# Patient Record
Sex: Female | Born: 1945
Health system: Southern US, Community
[De-identification: ages and names within clinical notes are randomized; demographics above are authoritative.]

## PROBLEM LIST (undated history)

## (undated) DIAGNOSIS — I10 Essential (primary) hypertension: Secondary | ICD-10-CM

## (undated) DIAGNOSIS — E119 Type 2 diabetes mellitus without complications: Secondary | ICD-10-CM

## (undated) DIAGNOSIS — I2510 Atherosclerotic heart disease of native coronary artery without angina pectoris: Secondary | ICD-10-CM

## (undated) DIAGNOSIS — E785 Hyperlipidemia, unspecified: Secondary | ICD-10-CM

## (undated) DIAGNOSIS — E039 Hypothyroidism, unspecified: Secondary | ICD-10-CM

## (undated) DIAGNOSIS — N183 Chronic kidney disease, stage 3 unspecified: Secondary | ICD-10-CM

## (undated) DIAGNOSIS — C649 Malignant neoplasm of unspecified kidney, except renal pelvis: Secondary | ICD-10-CM

## (undated) DIAGNOSIS — Z8639 Personal history of other endocrine, nutritional and metabolic disease: Secondary | ICD-10-CM

## (undated) DIAGNOSIS — S37019A Minor contusion of unspecified kidney, initial encounter: Secondary | ICD-10-CM

## (undated) DIAGNOSIS — N184 Chronic kidney disease, stage 4 (severe): Secondary | ICD-10-CM

## (undated) DIAGNOSIS — I214 Non-ST elevation (NSTEMI) myocardial infarction: Secondary | ICD-10-CM

## (undated) DIAGNOSIS — J189 Pneumonia, unspecified organism: Secondary | ICD-10-CM

## (undated) DIAGNOSIS — M1990 Unspecified osteoarthritis, unspecified site: Secondary | ICD-10-CM

## (undated) DIAGNOSIS — N2889 Other specified disorders of kidney and ureter: Secondary | ICD-10-CM

## (undated) DIAGNOSIS — R809 Proteinuria, unspecified: Secondary | ICD-10-CM

## (undated) HISTORY — DX: Malignant neoplasm of unspecified kidney, except renal pelvis: C64.9

## (undated) HISTORY — DX: Type 2 diabetes mellitus without complications: E11.9

## (undated) HISTORY — PX: ABDOMINAL HYSTERECTOMY: SHX81

## (undated) HISTORY — DX: Chronic kidney disease, stage 4 (severe): N18.4

## (undated) HISTORY — DX: Essential (primary) hypertension: I10

## (undated) HISTORY — DX: Hyperlipidemia, unspecified: E78.5

## (undated) HISTORY — DX: Chronic kidney disease, stage 3 (moderate): N18.3

## (undated) HISTORY — DX: Atherosclerotic heart disease of native coronary artery without angina pectoris: I25.10

## (undated) HISTORY — DX: Personal history of other endocrine, nutritional and metabolic disease: Z86.39

## (undated) HISTORY — DX: Proteinuria, unspecified: R80.9

## (undated) HISTORY — DX: Other specified disorders of kidney and ureter: N28.89

---
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
## 2002-02-21 ENCOUNTER — Emergency Department (HOSPITAL_COMMUNITY): Admission: EM | Admit: 2002-02-21 | Discharge: 2002-02-21 | Payer: Self-pay | Admitting: Emergency Medicine

## 2002-02-21 ENCOUNTER — Encounter: Payer: Self-pay | Admitting: Emergency Medicine

## 2003-10-20 IMAGING — CR DG ABDOMEN ACUTE W/ 1V CHEST
3 series · 3 of 3 positions shown · non-contrast
Comparison: none

CLINICAL DATA: Nausea and vomiting.  Weakness.  Obese patient. 
 ACUTE ABDOMINAL SERIES WITH CHEST [DATE]:
 The bowel gas pattern is normal.  There is no free peritoneal air.  The upright chest film included with the study demonstrates no active chest disease radiographically.  There is borderline cardiomegaly present.

[view not recorded (1 of 3)]
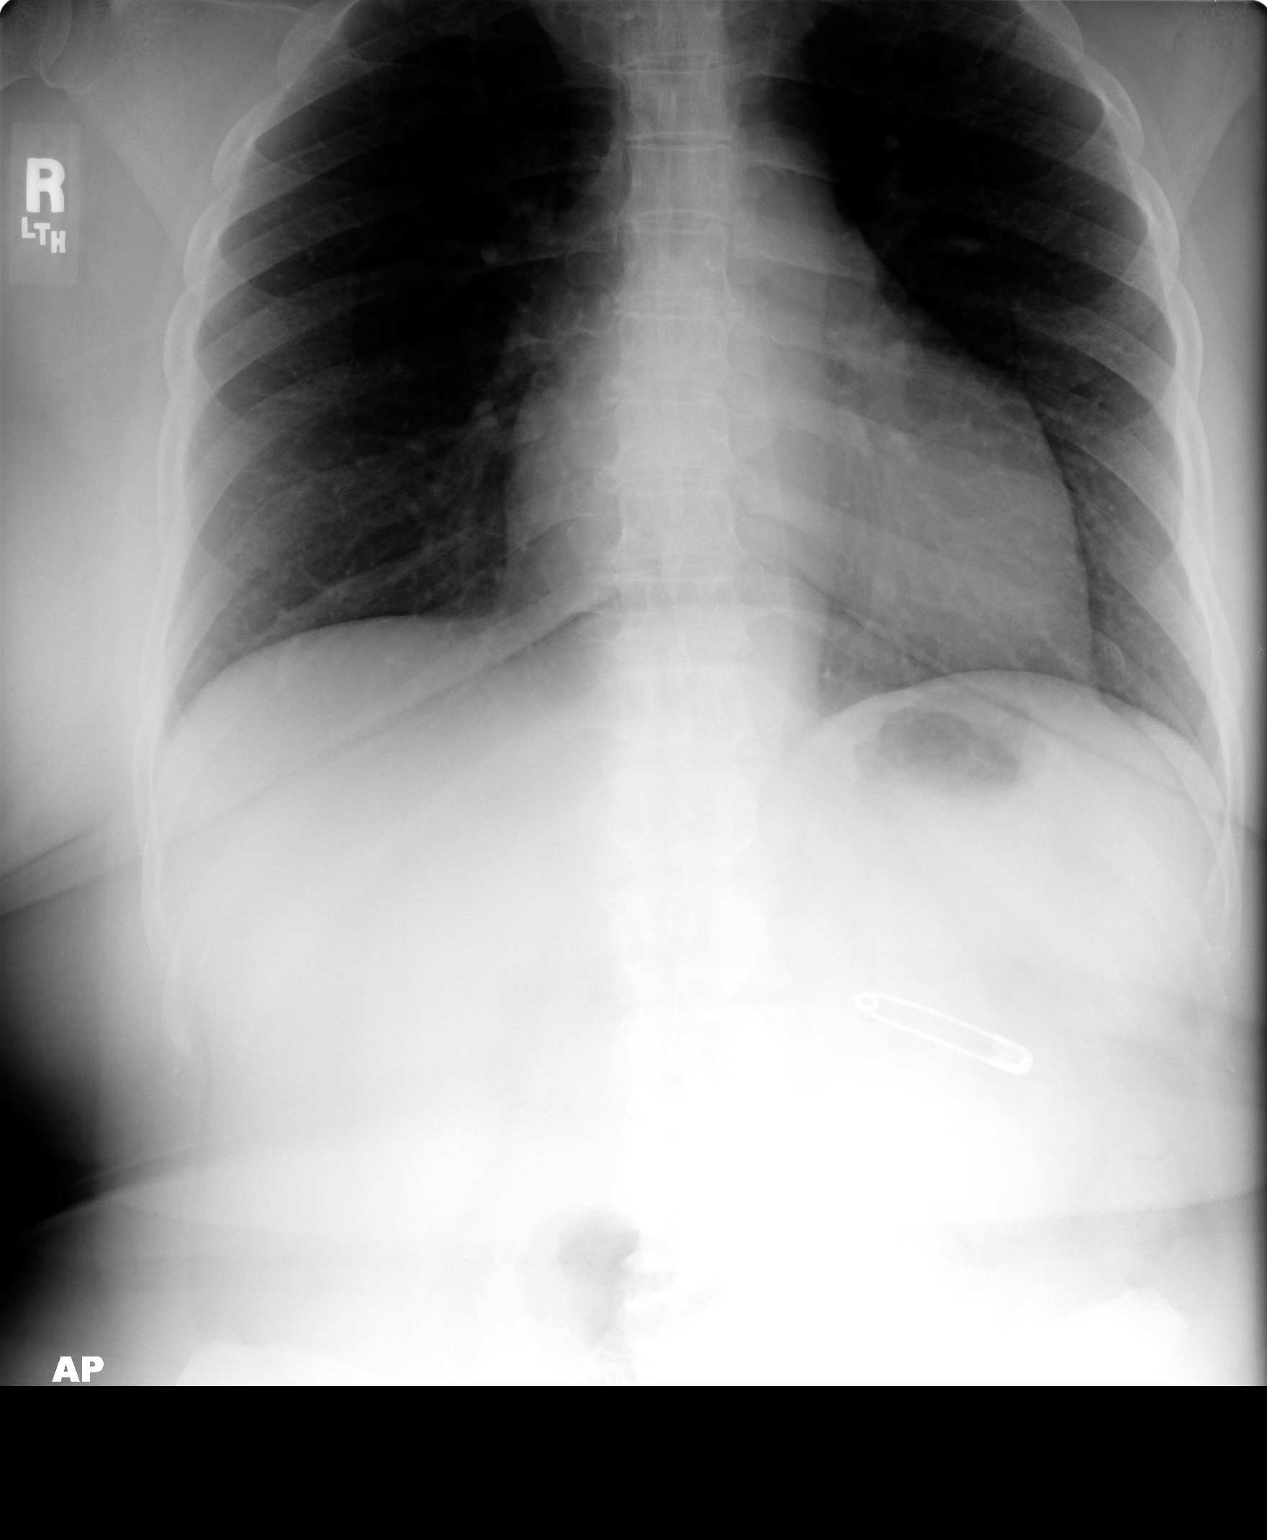

[view not recorded (2 of 3)]
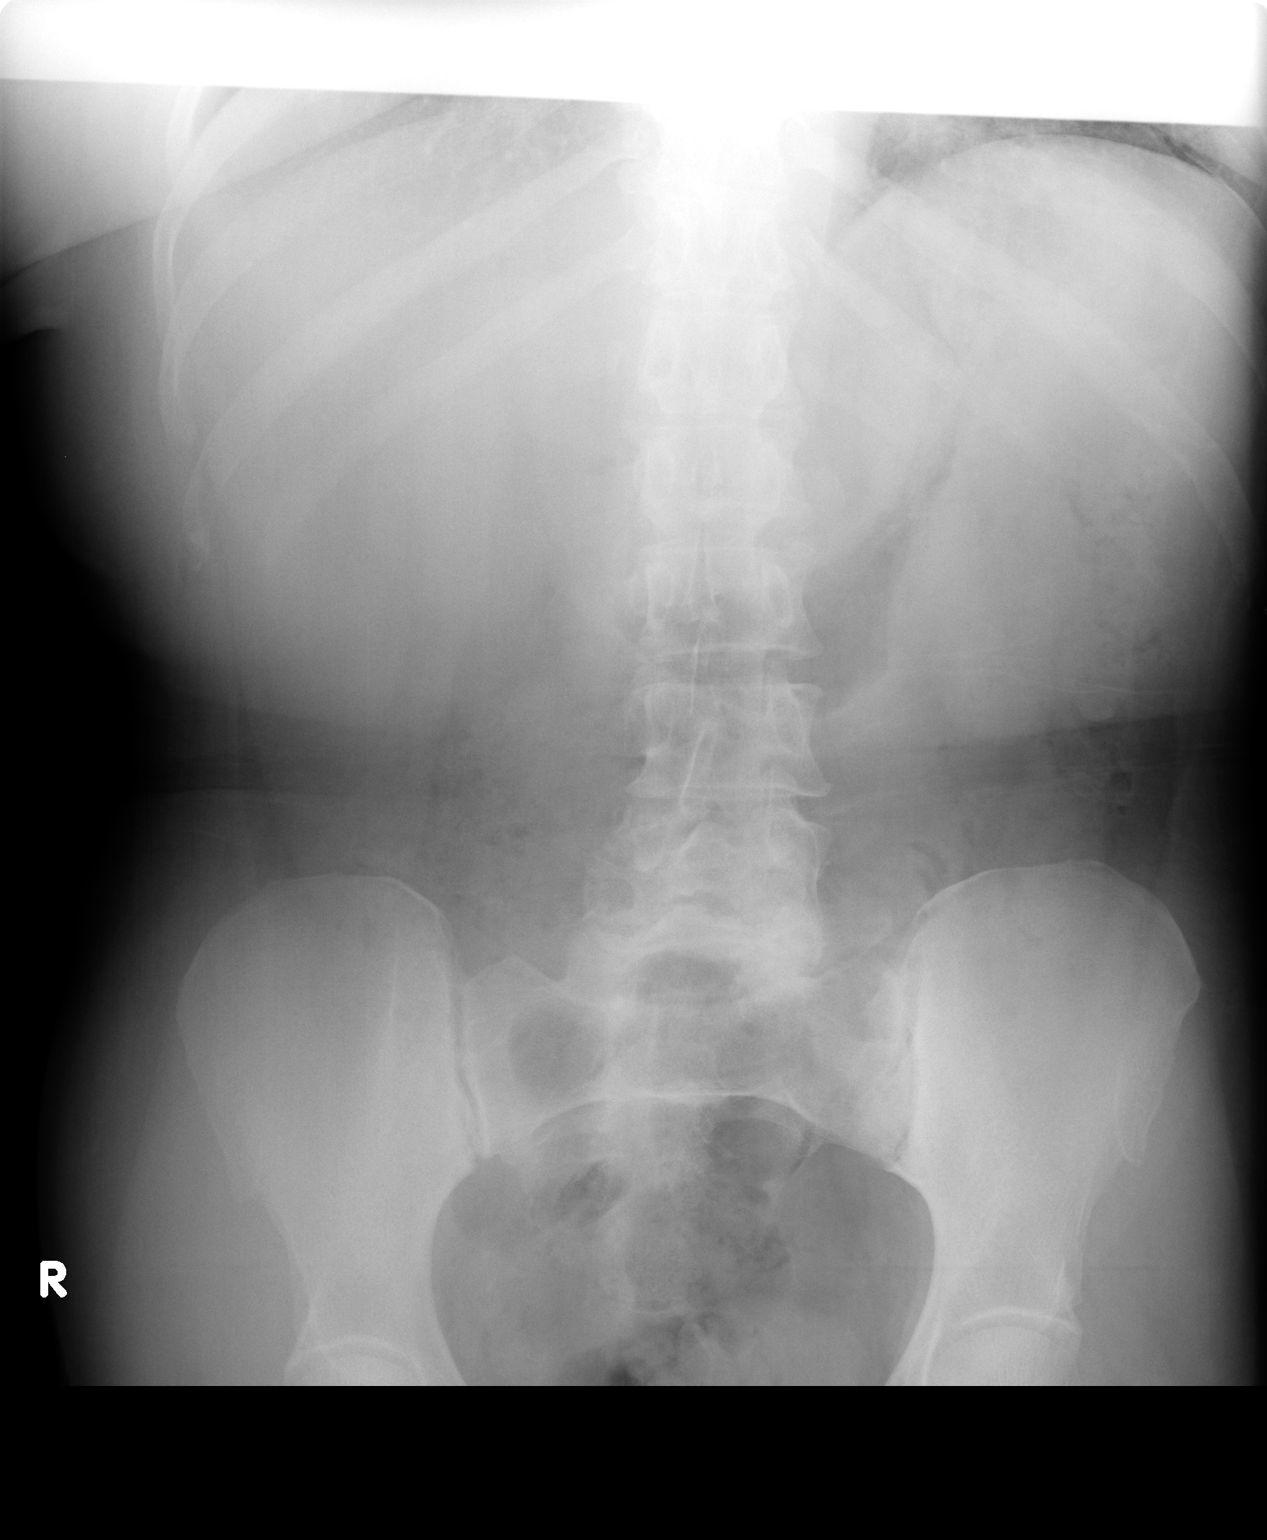

[view not recorded (3 of 3)]
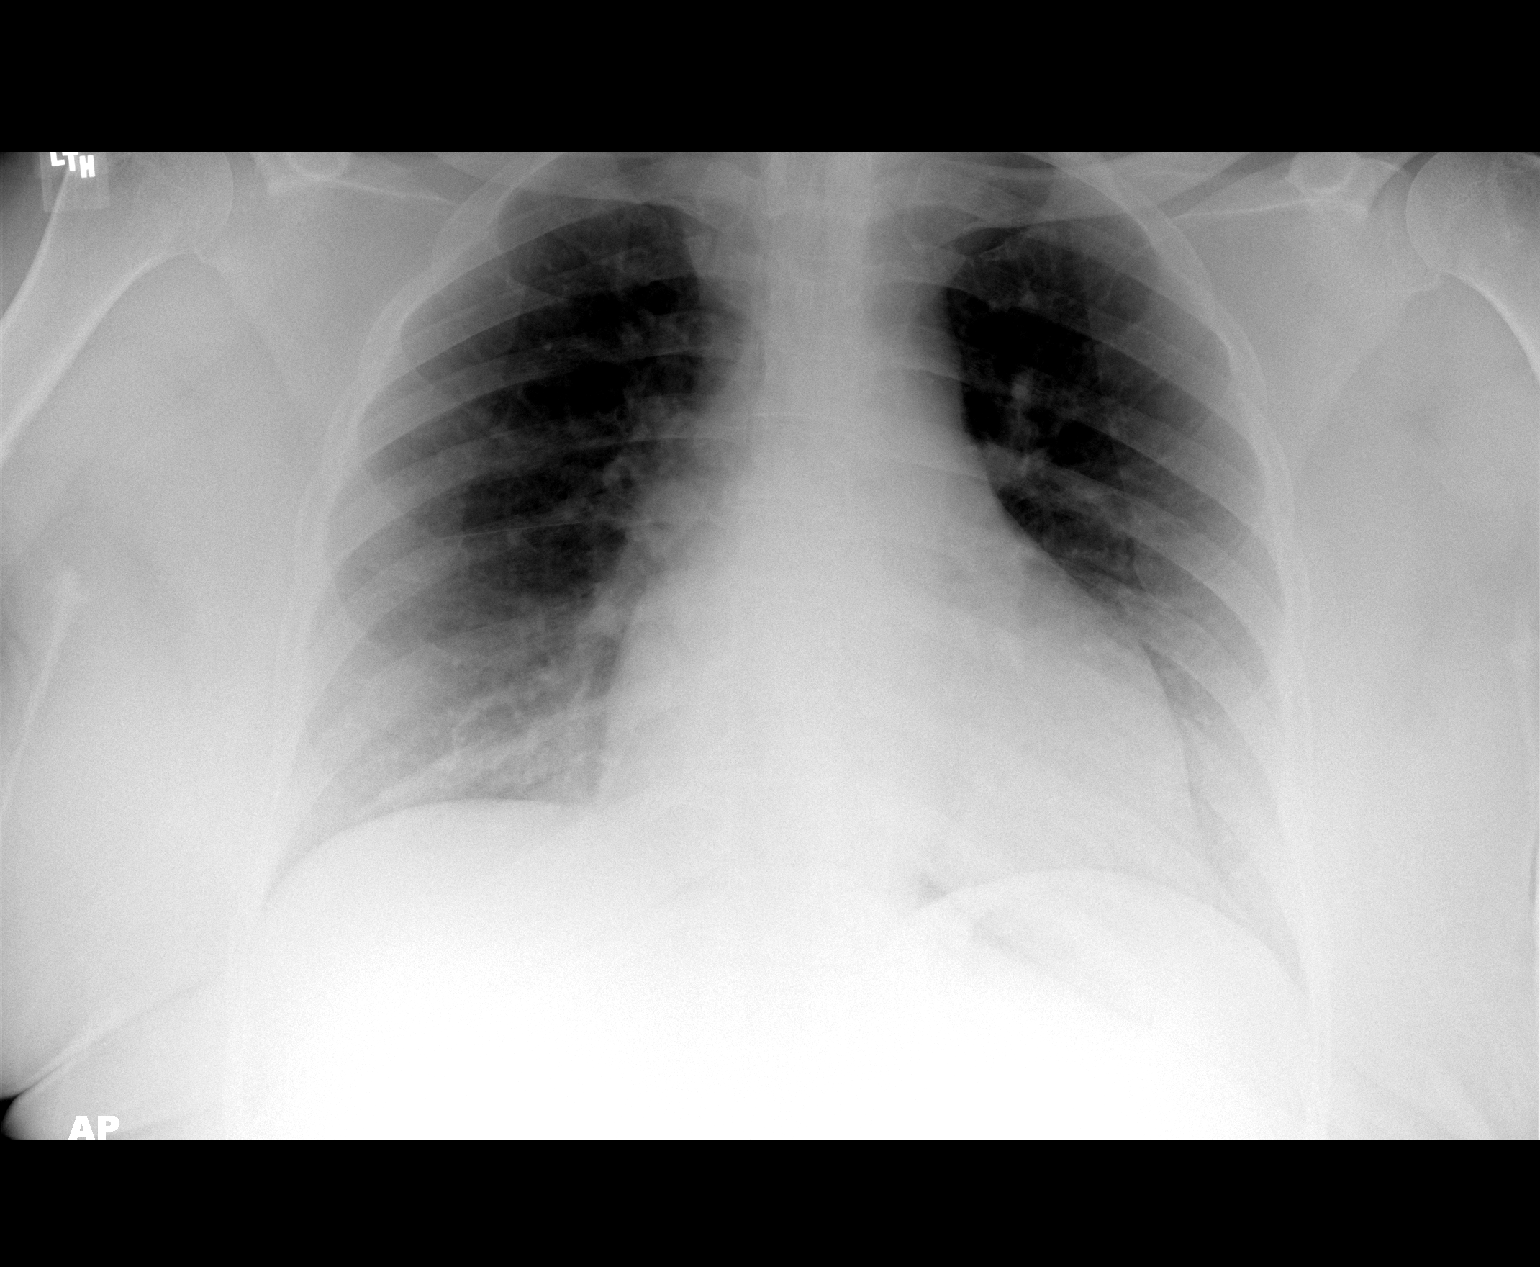

[3 of 3 positions shown; findings below may reference images not displayed]

IMPRESSION: Normal bowel gas pattern and no evidence for free peritoneal air.  Mild cardiomegaly.  No evidence for active chest disease.

## 2004-03-24 ENCOUNTER — Inpatient Hospital Stay (HOSPITAL_COMMUNITY): Admission: EM | Admit: 2004-03-24 | Discharge: 2004-03-26 | Payer: Self-pay | Admitting: Emergency Medicine

## 2004-04-08 DIAGNOSIS — I2510 Atherosclerotic heart disease of native coronary artery without angina pectoris: Secondary | ICD-10-CM

## 2004-04-08 HISTORY — PX: CORONARY ARTERY BYPASS GRAFT: SHX141

## 2004-04-08 HISTORY — DX: Atherosclerotic heart disease of native coronary artery without angina pectoris: I25.10

## 2005-03-06 ENCOUNTER — Ambulatory Visit (HOSPITAL_COMMUNITY): Admission: RE | Admit: 2005-03-06 | Discharge: 2005-03-06 | Payer: Self-pay | Admitting: Nephrology

## 2005-03-06 IMAGING — US US RENAL
1 series · 14 of 25 positions shown · non-contrast
Comparison: none

CLINICAL DATA: Renal insufficiency.
RENAL/URINARY TRACT ULTRASOUND:
TECHNIQUE: Complete ultrasound examination of the urinary tract was performed including evaluation of the kidneys, renal collecting systems, and urinary bladder.

[Series 1: unknown · 0.34mm/px · 14 of 38 slices shown]
[im 1/38]
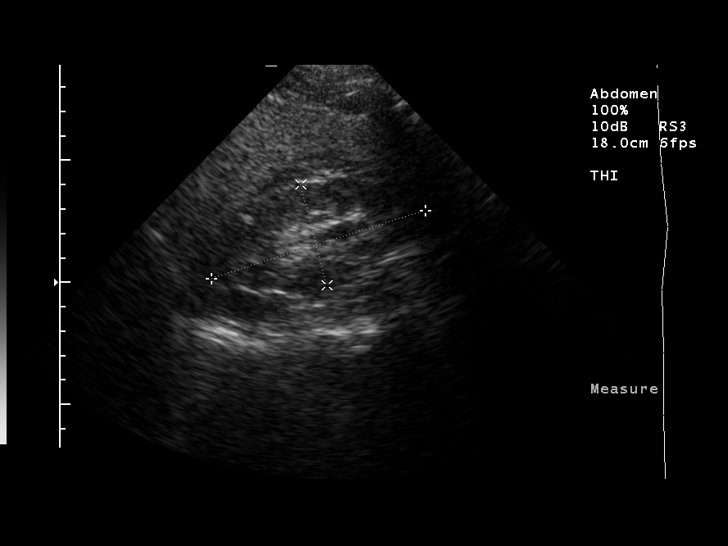
[im 4/38]
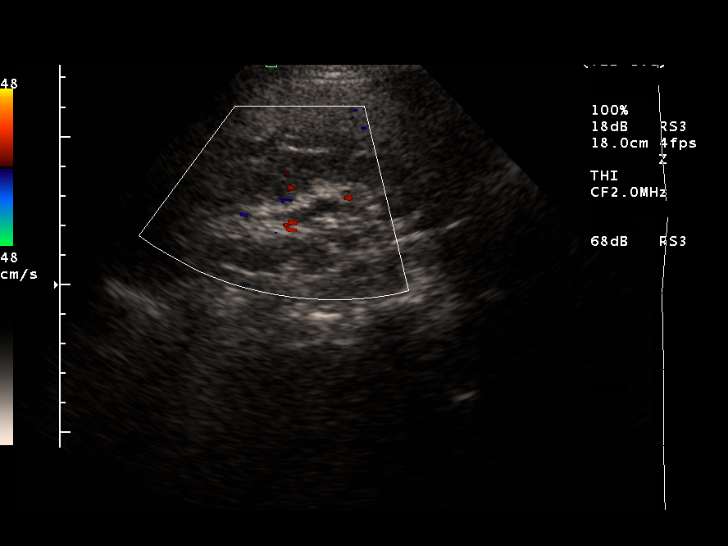
[im 7/38]
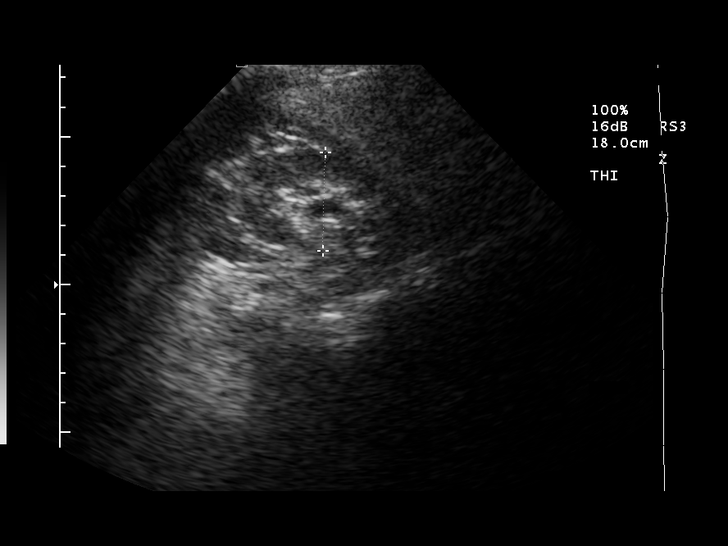
[im 10/38]
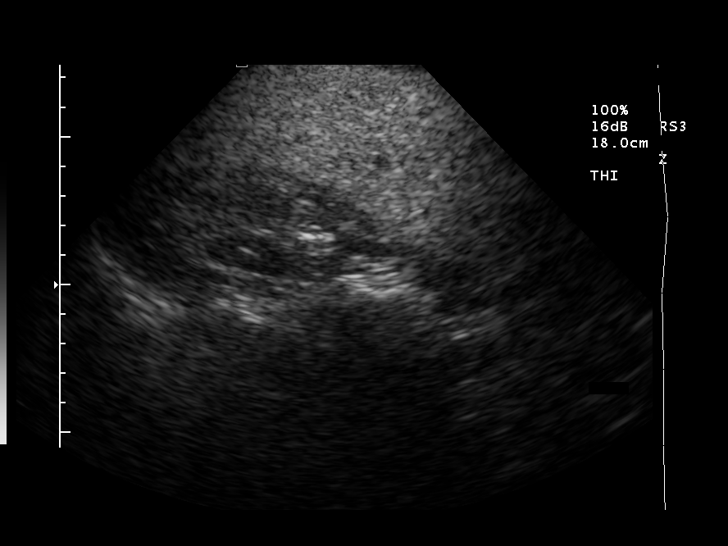
[im 13/38]
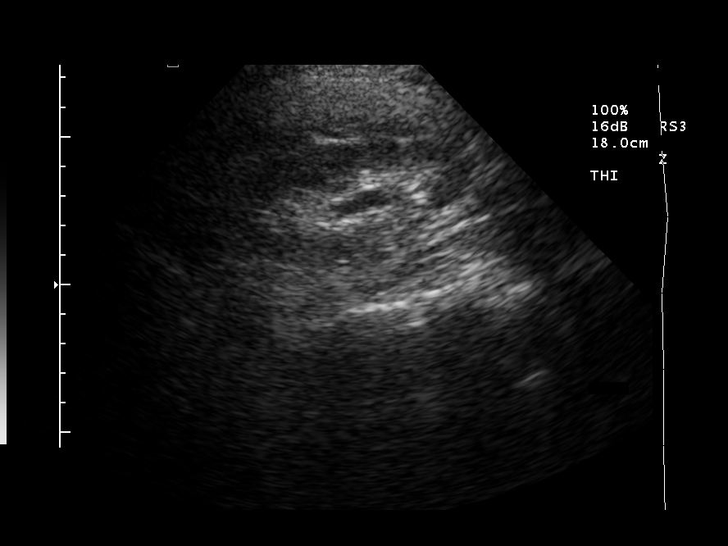
[im 14/38]
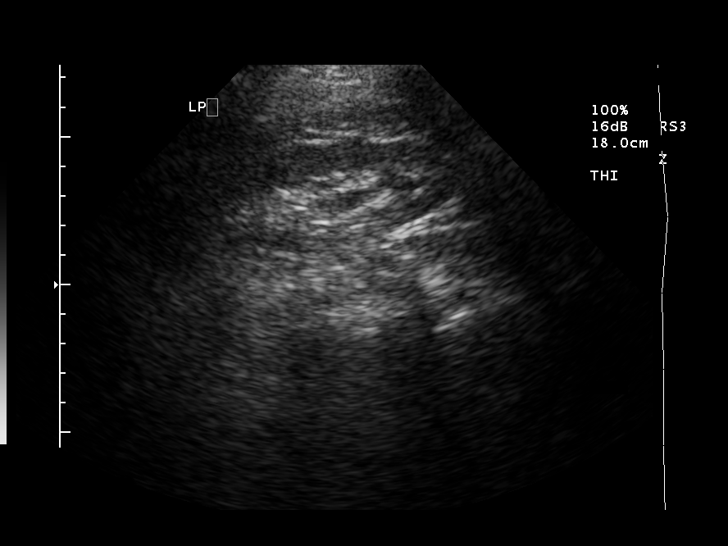
[im 17/38]
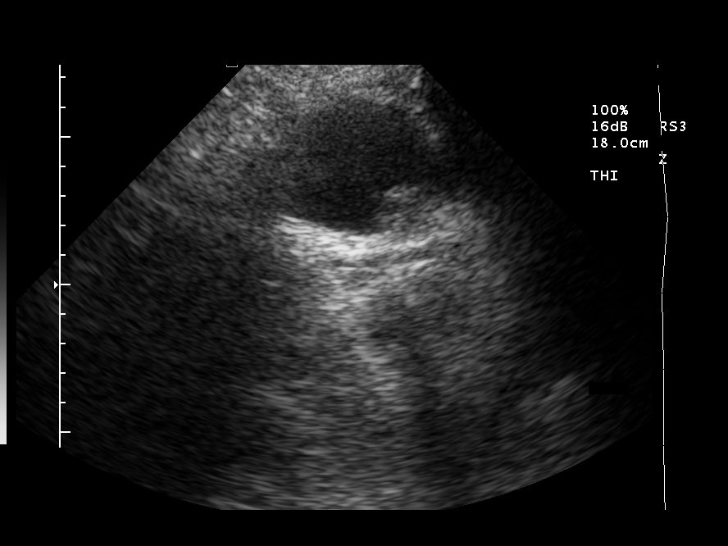
[im 21/38]
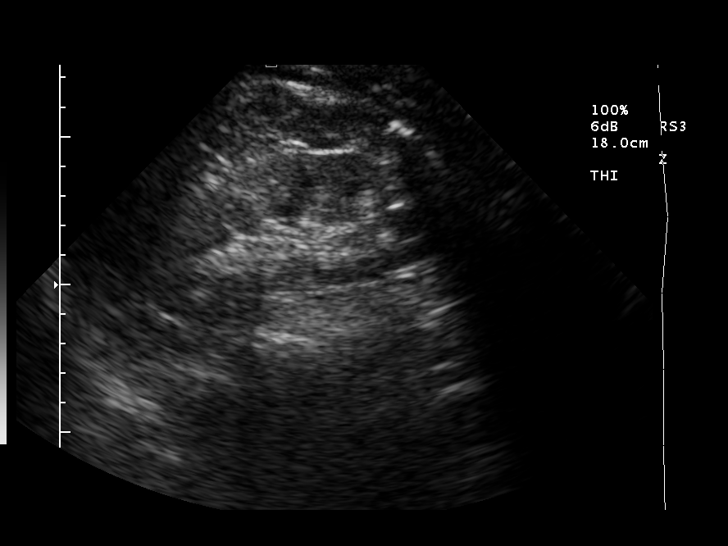
[im 24/38]
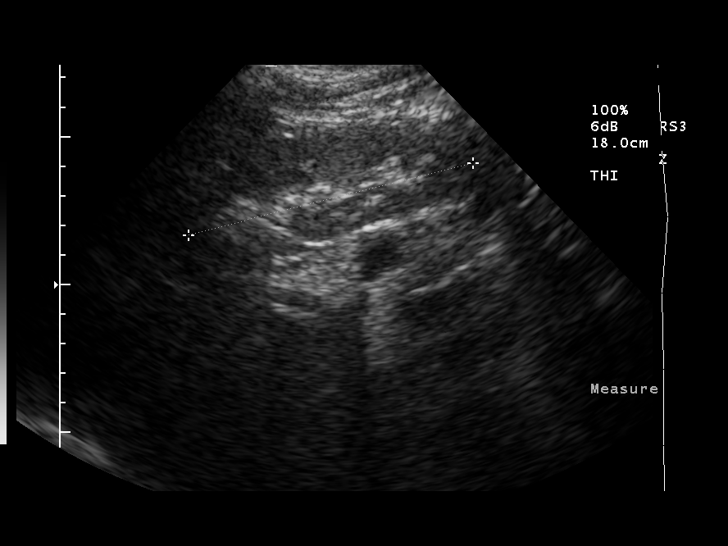
[im 25/38]
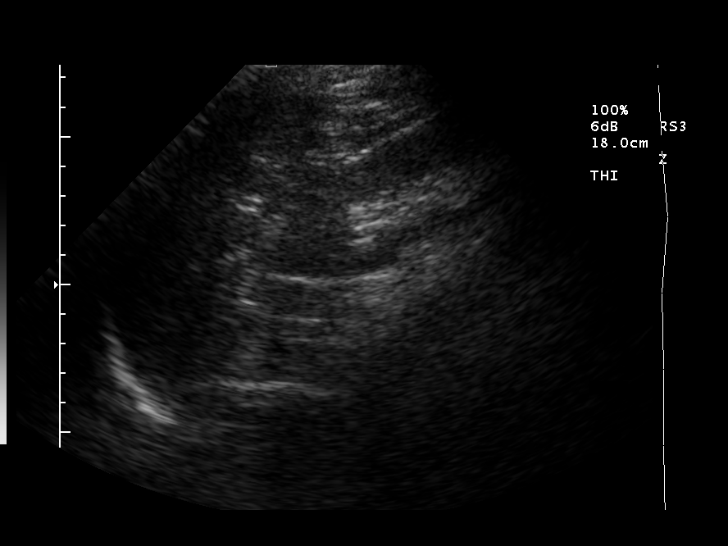
[im 28/38]
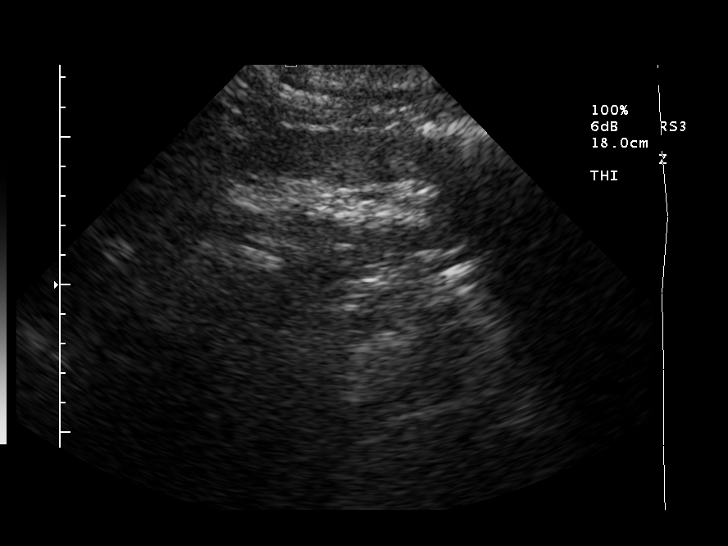
[im 31/38]
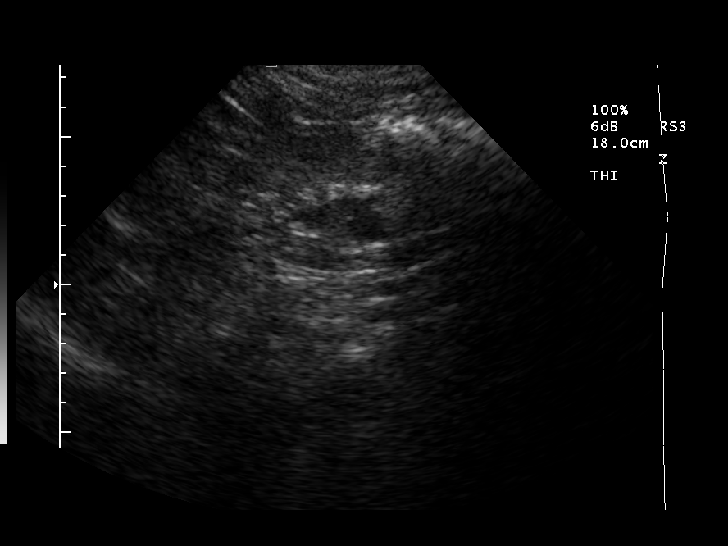
[im 34/38]
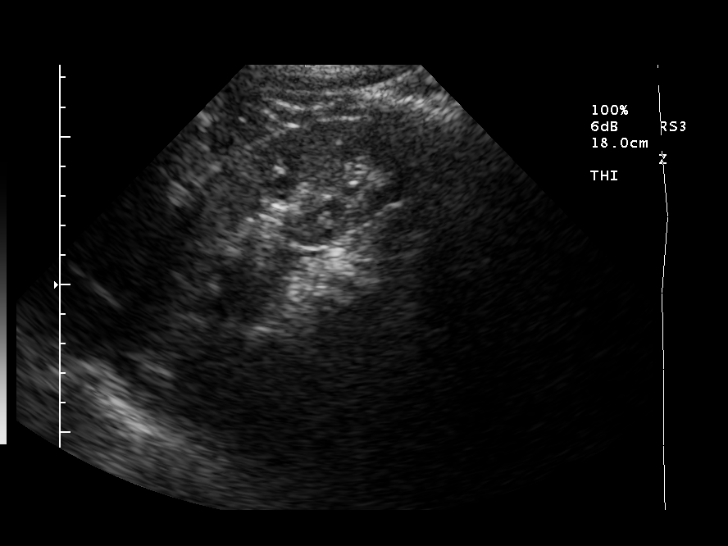
[im 38/38]
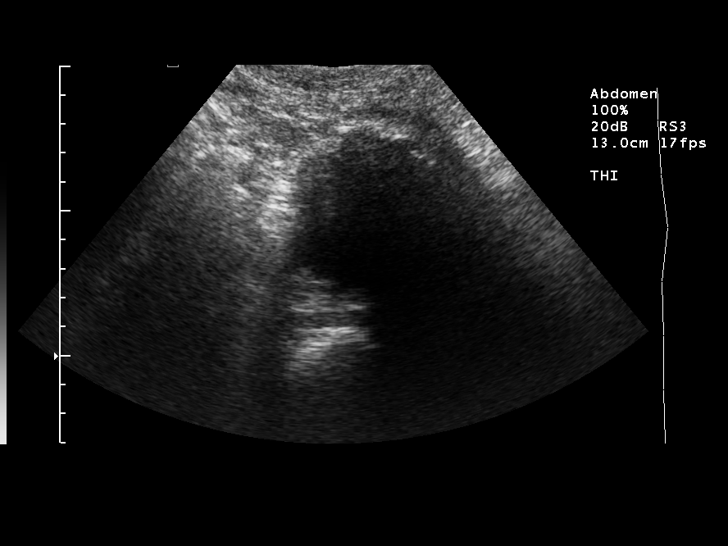

[14 of 25 positions shown; findings below may reference images not displayed]

FINDINGS: Right and left kidneys measure 9.2 cm and 9.9 cm in length respectively.  Mild diffuse increase in renal parenchymal echogenicity.  Mild dilatation of renal collecting systems bilaterally.  No overt hydronephrosis.  Suboptimal visualization of the bladder.  Incidentally, I get the impression that the hepatic echotexture may be accentuated possibly representing diffuse hepatocellular disease.
IMPRESSION: No frank hydronephrosis although there is minimal dilatation of the collecting systems.  Findings compatible with nonspecific renal medial disease.

## 2008-07-29 ENCOUNTER — Emergency Department (HOSPITAL_COMMUNITY): Admission: EM | Admit: 2008-07-29 | Discharge: 2008-07-29 | Payer: Self-pay | Admitting: Emergency Medicine

## 2008-07-29 IMAGING — CR DG FOOT COMPLETE 3+V*R*
3 series · 3 of 3 positions shown · non-contrast
Comparison: Ankle films same day.

CLINICAL DATA: Twisted right foot.  Foot pain.  Trauma.

RIGHT FOOT COMPLETE - 3+ VIEW

[view not recorded (1 of 3)]
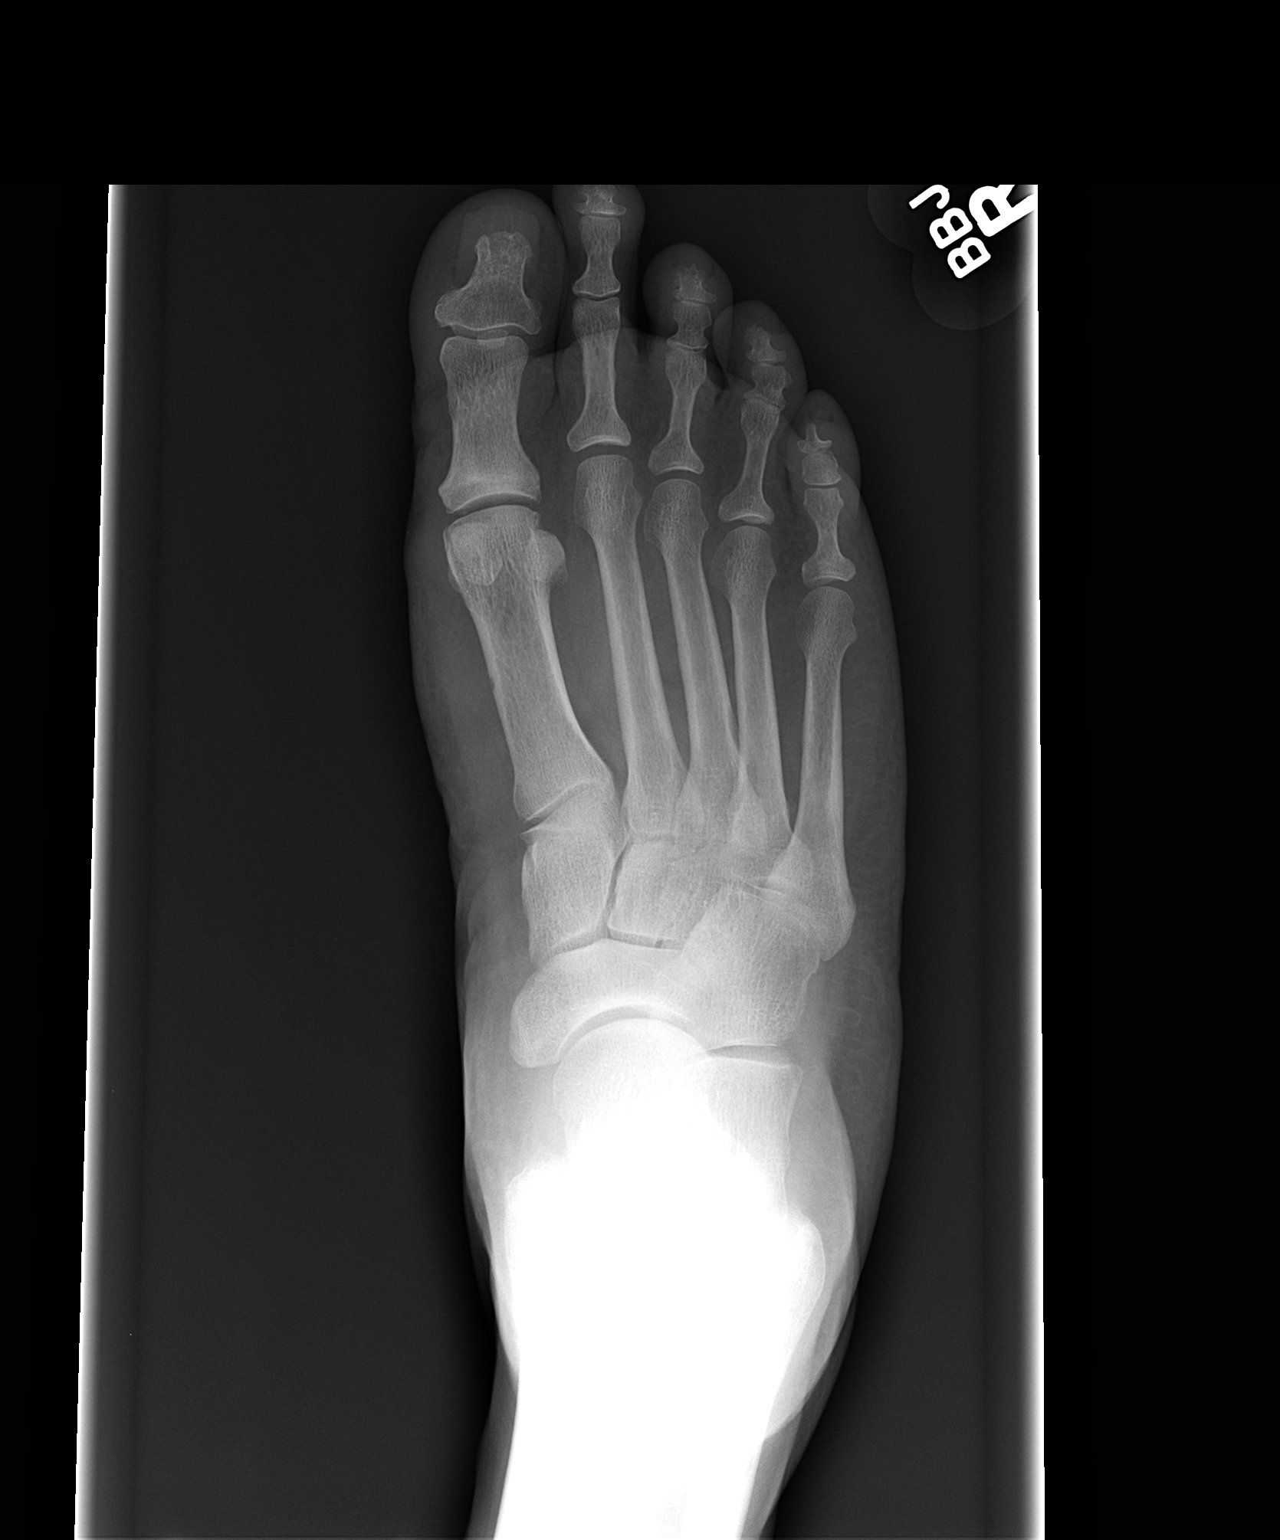

[view not recorded (2 of 3)]
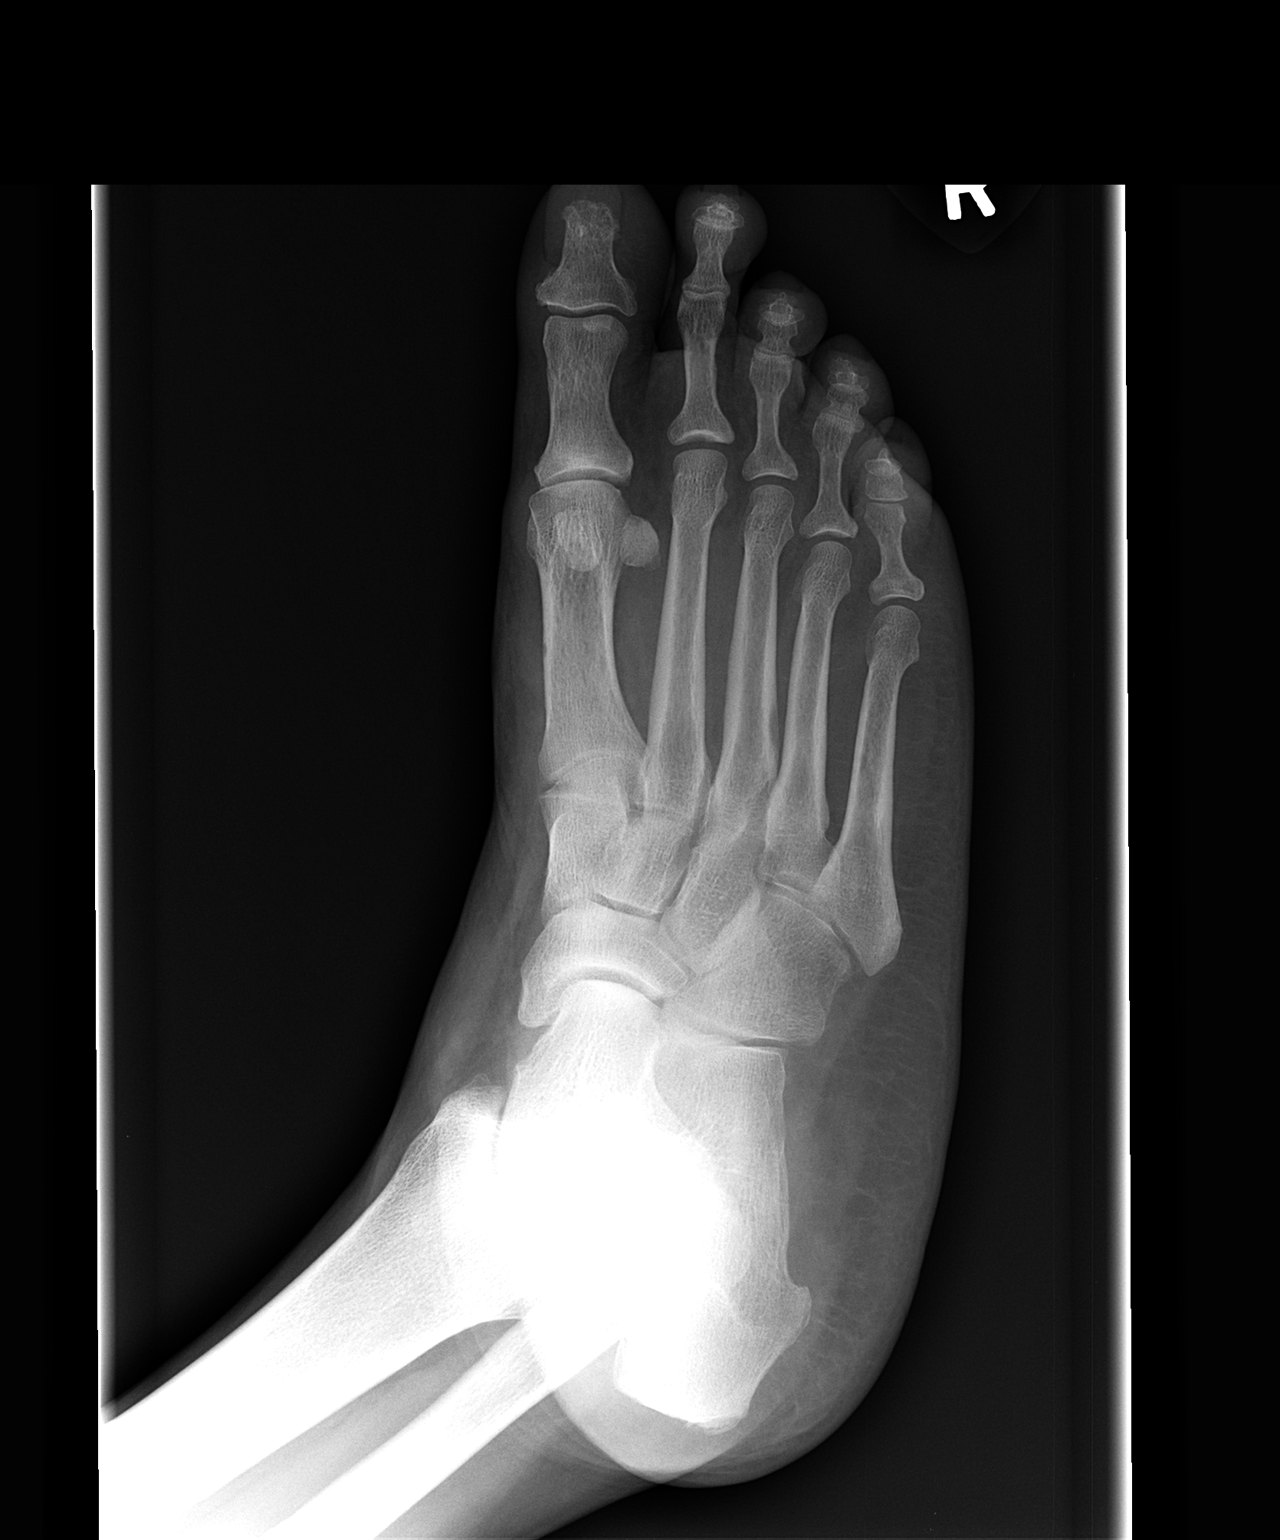

[view not recorded (3 of 3)]
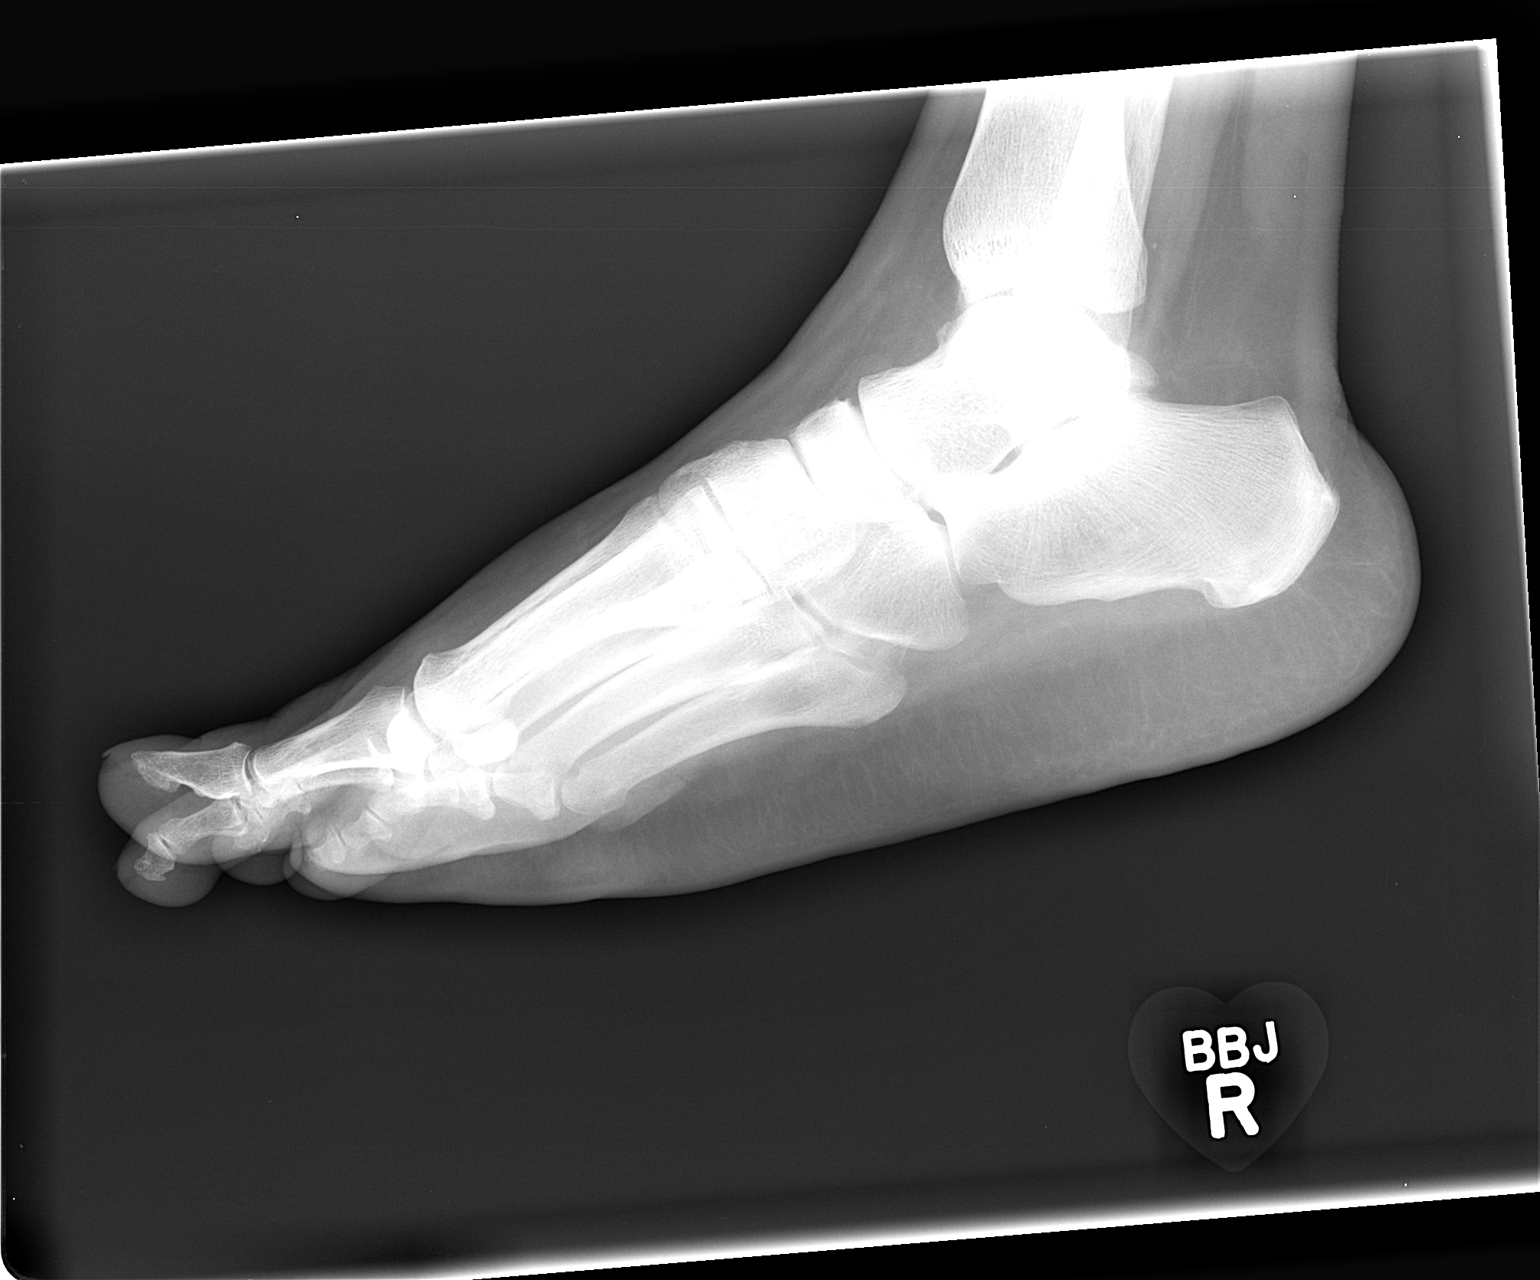

[3 of 3 positions shown; findings below may reference images not displayed]

FINDINGS: Mild first MTP joint osteoarthritis.  No fracture is
identified.  The alignment of the bones of the foot is anatomic.
Small Achilles spur.  Minimal talonavicular osteoarthritis.  No
radiopaque foreign bodies.
IMPRESSION: No acute osseous abnormality.

## 2008-07-29 IMAGING — CR DG ANKLE COMPLETE 3+V*R*
3 series · 3 of 3 positions shown · non-contrast
Comparison: None available

CLINICAL DATA: Twisted right foot.  Foot and ankle pain.

RIGHT ANKLE - COMPLETE 3+ VIEW

[view not recorded (1 of 3)]
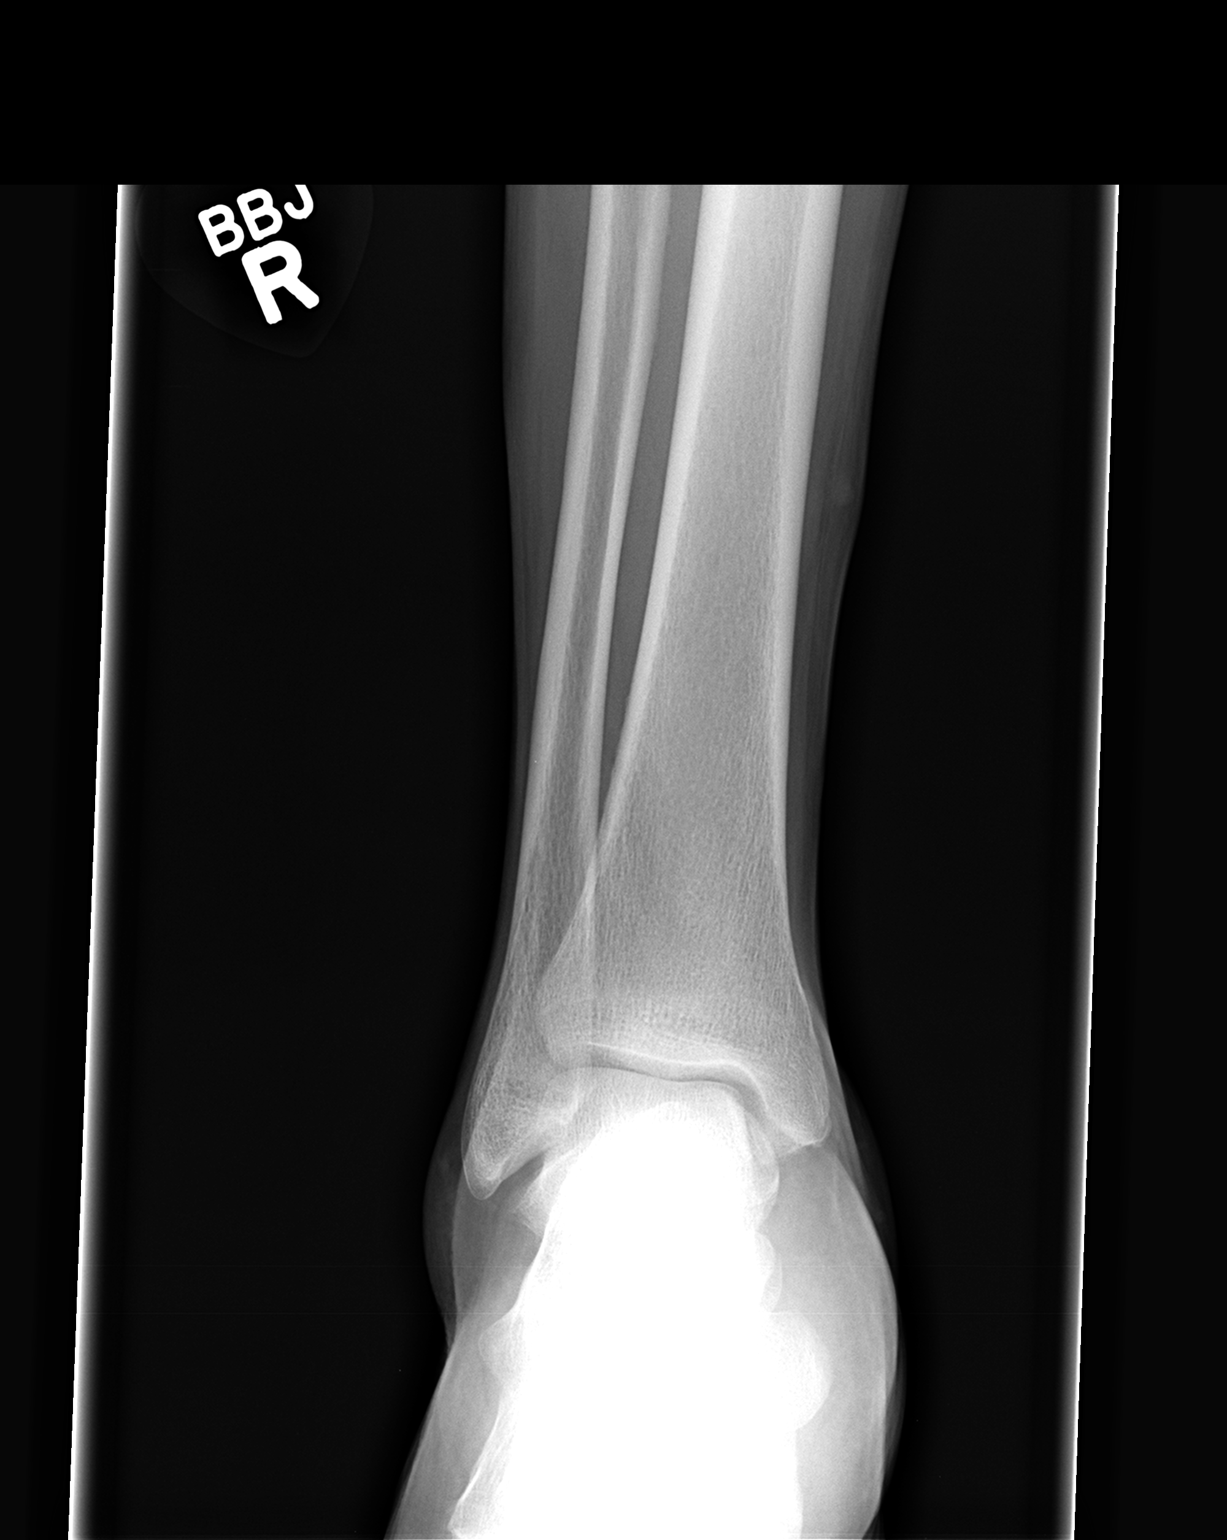

[view not recorded (2 of 3)]
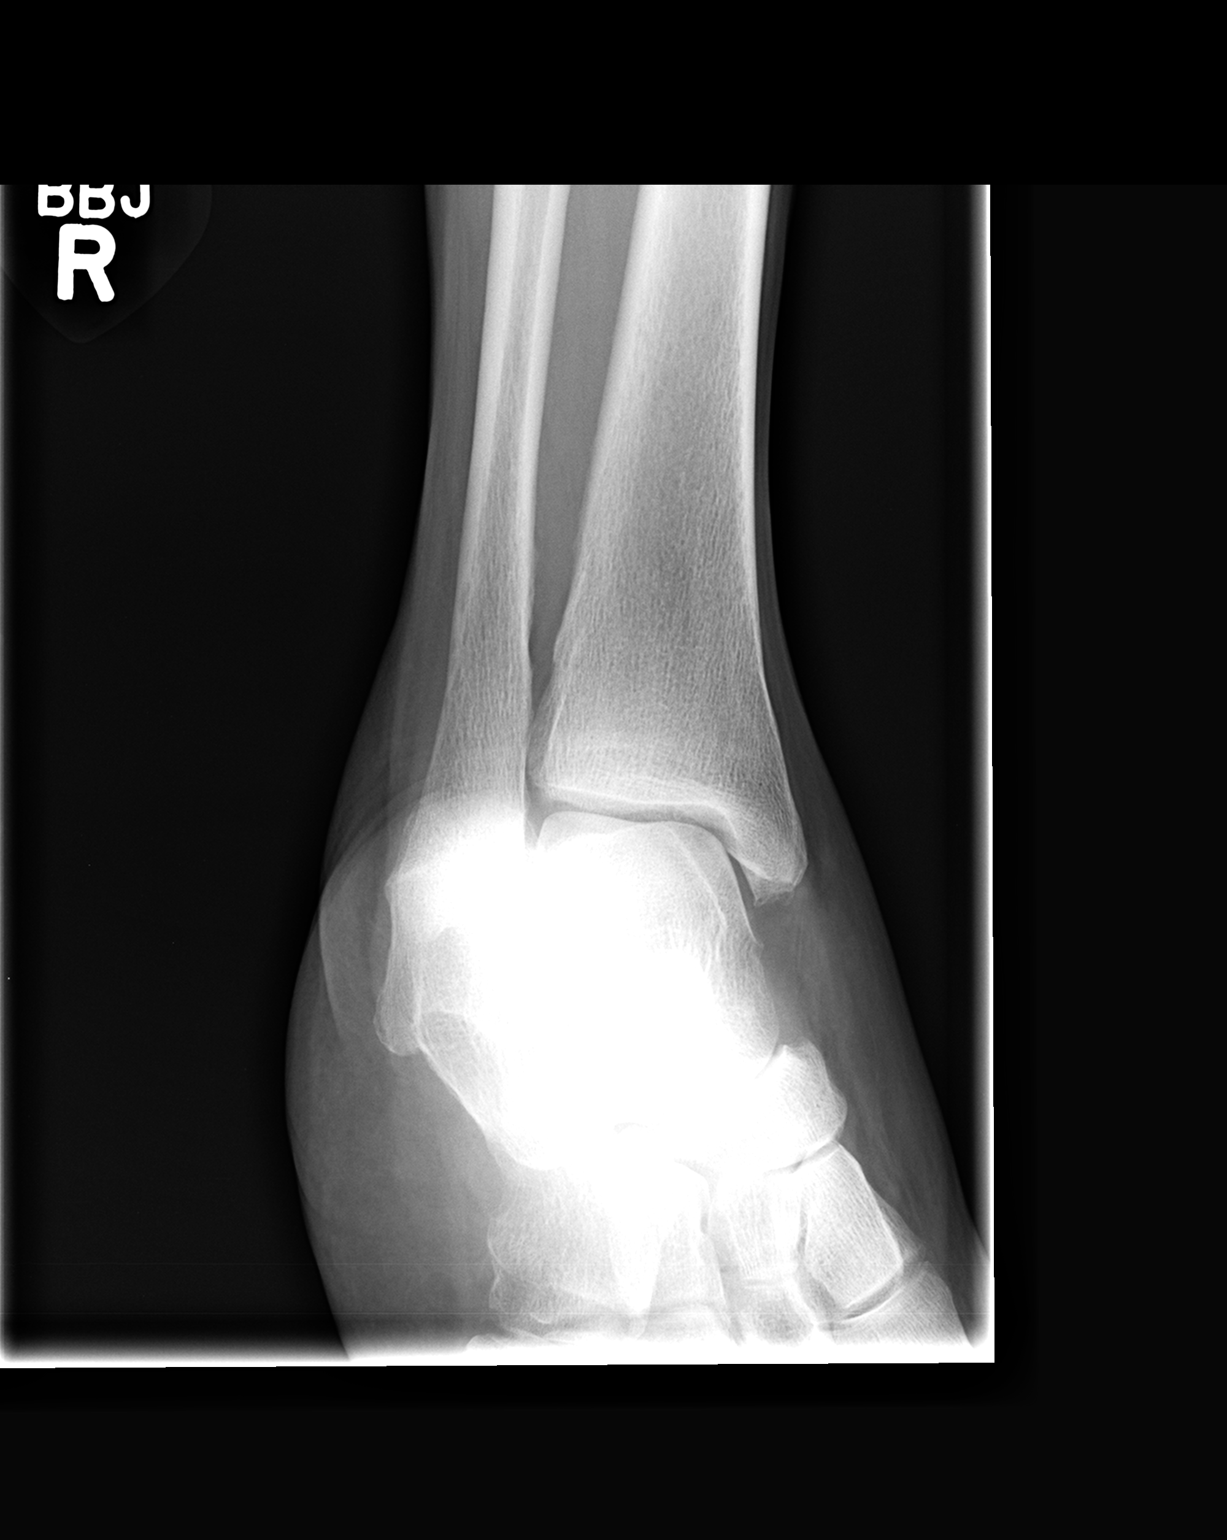

[view not recorded (3 of 3)]
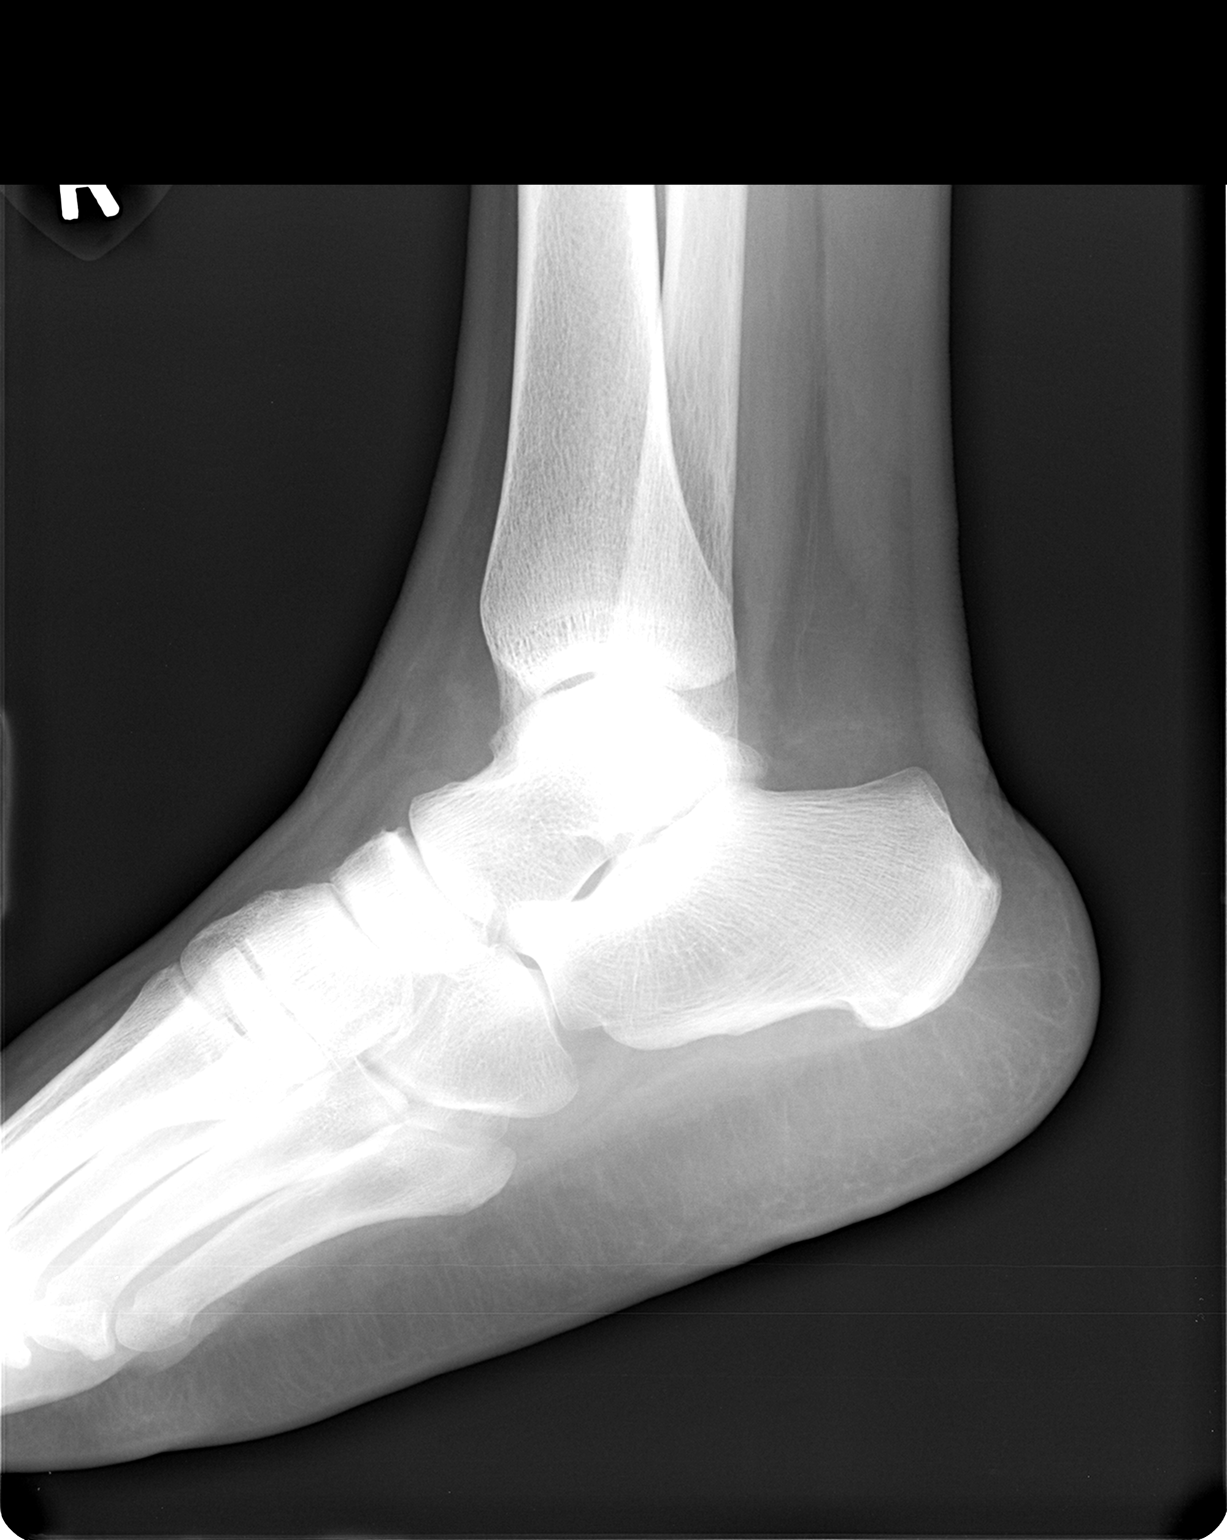

[3 of 3 positions shown; findings below may reference images not displayed]

FINDINGS: Distal tibia and fibula intact.  Ankle mortise congruent.
Talar dome intact.  No ankle effusion.  No fracture.
IMPRESSION: No acute osseous abnormality.

## 2008-12-14 ENCOUNTER — Emergency Department (HOSPITAL_COMMUNITY): Admission: EM | Admit: 2008-12-14 | Discharge: 2008-12-14 | Payer: Self-pay

## 2010-08-24 NOTE — Group Therapy Note (Signed)
NAME:  Erin Jordan, Erin Jordan NO.:  [id number]   MEDICAL RECORD NO.:  [id number]          PATIENT TYPE:  INP   LOCATION:  A310                          FACILITY:  [facility]   PHYSICIAN:  Audria Nine, M.D.DATE OF BIRTH:  Jul 18, 1945   DATE OF PROCEDURE:  03/25/2004  DATE OF DISCHARGE:                                   PROGRESS NOTE   SUBJECTIVE:  The patient still feels lightheaded and dizzy when she gets up  to go to bedside commode, although she feels overall slightly better.  She  says she has some diarrhea and also has some abdominal pain.  She has not  had anymore nausea and vomiting.  The patient's blood pressure has been  noting to be increasing.  She was not orthostatic per blood pressure  management.   OBJECTIVE:  GENERAL:  Conscious, alert and appears weak.  VITAL SIGNS:  Blood pressure 129/67, pulse 76, respirations 16, temperature  97.8.  Glucose is ranging between 142-180.  HEENT:  Normocephalic, atraumatic.  Oral mucosa was moist and no exudates.  NECK:  Supple with no JVD or lymphadenopathy.  LUNGS:  Clear clinically.  HEART:  S1, S2 regular with no S3, S4, gallops or rubs.  ABDOMEN:  Generalized vague tenderness, but was not distended with no  rebound or guarding.  EXTREMITIES:  No pitting pedal edema.  No calf induration noted.  NEUROLOGIC:  Grossly intact.   LABORATORY DATA AND X-RAY FINDINGS:  White blood cell count 15.8, hemoglobin  14.0, hematocrit 9.8, no left shift.  Sodium 133, potassium 3.5, chloride  102, CO2 24, glucose 161, BUN 14, creatinine 1.2.  Alk phos 130, AST 20, ALT  28, albumin 3.5, calcium 9.1, lipase 40.   ASSESSMENT:  Erin Jordan is a 65 year old, African-American female  admitted with nausea, vomiting, abdominal pain and presyncopal episode  yesterday.  Symptoms consistent with acute gastroenteritis.  She continued  to feel lightheaded.  She does feel a little bit better today.  She still  has some diarrhea.  Her lipase  has returned to normal.  She has slight  leukocytosis, reasons unclear, probably likely due to her infection, but no  evidence to suggest bacterial infection at this time.  Blood sugars have  also improved.   PLAN:  Continue IV fluids, hydration at this time.  Will monitor her for  another 24 hours.  Anticipate discharge in the morning.  Continue on sliding  scale and will continue to hold her oral hypoglycemics at this time until we  are sure the patient will be able to tolerate p.o. well.     Ayor   AM/MEDQ  D:  03/25/2004  T:  03/25/2004  Job:  [id number]

## 2010-08-24 NOTE — Discharge Summary (Signed)
NAME:  Erin Jordan, Erin Jordan               ACCOUNT NO.:  [id number]   MEDICAL RECORD NO.:  [id number]          PATIENT TYPE:  INP   LOCATION:  A310                          FACILITY:  [facility]   PHYSICIAN:  Debbe Odea, M.D.     DATE OF BIRTH:  [birth year]   DATE OF ADMISSION:  03/24/2004  DATE OF DISCHARGE:  LH                                 DISCHARGE SUMMARY   DISCHARGE DIAGNOSES:  1.  Acute gastroenteritis.  2. Hypertension.  3. Diabetes mellitus.  4.      Asthma.  5. History of hypertension.  6. Status post hysterectomy.  7.      Hypercholesterolemia.   CHIEF COMPLAINT:  Nausea, vomiting, and diarrhea for two days.   HOSPITAL COURSE:  This is a 65 year old African-American female who was  admitted for nausea, vomiting, and loose stools.  The patient did not have  any fever or chills on admission.  On admission, she did not complain of any  abdominal pain.  The patient was admitted and IV fluids were started.  The  patient was placed on an insulin sliding scale, and oral hypoglycemics were  held as the patient was vomiting.  Her blood pressure medications were also  placed on old due to hypotension.  Blood work on admission showed only a  mildly elevated lipase and glucose of 212.  All other blood work including  LFTs was normal.  X-ray of the abdomen on admission showed a normal gas  pattern with no evidence of free air.  The patient on day #2 was still a  little bit dizzy.  She still had some nausea and vomiting.  However, she was  no longer orthostatic.  Today, the patient is feeling well.  She is no  longer dizzy.  She states that she is able to tolerate a liquid diet.  She  has not had any nausea or vomiting this morning.  She has not had any bowel  movement since admission.  She has mild lower abdominal pain which she says  is intermittent.   Vital signs:  Temperature 99, pulse 85, respiratory rate 16, blood pressure  is 150/85.   Labs:  white count 10.5, hemoglobin 13.5,  hematocrit 39.7 with an MCV of  87.6, platelets are 294.  Neutrophil percent is 55 with an absolute  neutrophils count of 5.8.  Sodium is 134, potassium is 3.3 which was  replaced.  Chloride is 103, bicarbonate is 26, glucose 139, BUN 15,  creatinine 1.4.  Calcium was 9.1.   Discharge Medications:  None.  Continue outpatient meds.   DISCHARGE INSTRUCTIONS:  Advance diet as tolerated.  Increase activity  slowly as tolerated.     Saim   SR/MEDQ  D:  03/26/2004  T:  03/26/2004  Job:  [id number]

## 2010-08-24 NOTE — H&P (Signed)
NAME:  Erin Jordan, Erin Jordan               ACCOUNT NO.:  [id number]   MEDICAL RECORD NO.:  [id number]          PATIENT TYPE:  INP   LOCATION:  A310                          FACILITY:  [facility]   PHYSICIAN:  Audria Nine, M.D.DATE OF BIRTH:  March 01, 1946   DATE OF ADMISSION:  DATE OF DISCHARGE:  LH                                HISTORY & PHYSICAL   ADMISSION DIAGNOSES:  1.  Acute gastroenteritis.  2.  Hypotension with pre-syncope.  3.  Poorly-controlled diabetes mellitus.   CHIEF COMPLAINT:  Nausea and vomiting, diarrhea of two days' duration.   HISTORY OF PRESENT ILLNESS:  Erin Jordan is a 65 year old African-American  female with past medical history significant for hypertension and diabetes  mellitus.  Patient reportedly was in her usual state of health until about  two days ago when she noticed nausea with vomiting.  She also had some  diarrhea, about 3-4 episodes a day.  She said stools were not frankly loose,  frankly watery, but they were not as consistent as it used to be.  The  patient denies any fever, chills or rigors.  She did not have any abdominal  pain.  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  There is no other household history of diarrhea.   Patient was seen in the emergency room.  Did get some IV fluids and  afterward she was going to be discharged.  Patient was noted to be  lightheaded, dizzy and almost passed out.   PAST MEDICAL HISTORY:  1.  Hypercholesterolemia.  2.  Hypertension.  3.  Asthma.  4.  Diabetes.  5.  Status post hysterectomy.   MEDICATIONS:  1.  Advair.  2.  Zyrtec 10 mg p.o. daily.  3.  Lipitor 40 mg p.o. daily.  4.  Accupril 20 mg p.o. daily.  5.  Glucotrol, dose is unknown.   ALLERGIES:  She has no known drug allergies.   FAMILY HISTORY:  Positive  for hypertension, diabetes, coronary artery  disease.  Otherwise, noncontributory.   SOCIAL HISTORY:  Patient is married.  Lives with husband.  She works in a  day care center.  She never smoked.  Does not drink alcohol.  Did not use  marijuana or drugs.  She has two children, grown up.   REVIEW OF SYSTEMS:  Review of systems were negative, except as mentioned in  history of present illness.   PHYSICAL EXAMINATION:  GENERAL:  Conscious, alert, comfortable, not in acute  distress.  VITAL SIGNS:  Blood pressure was 147/90, pulse of 90, respiratory rate of  20.  Pain scale was 5/10.  Pulse ox was 97% on room air.  Temperature was  97.5 degrees Fahrenheit.  HEENT:  Normocephalic, atraumatic.  Oral mucosa was dry.  No exudates.  NECK:  Supple.  No JVD.  No lymphadenopathy.  LUNGS:  Clear clinically with good  air entry bilaterally.  HEART:  S1, S2.  Regular.  No S3 or S4, gallops or rubs.  ABDOMEN:  Soft, nontender.  Bowel sounds were positive.  No masses palpable.  EXTREMITIES:  No pitting pedal edema.  No calf induration or tenderness was  noted.  CNS:  Briskly intact with no focal deficits.   LABORATORY DATA:  White blood cell count was 9.4, hemoglobin of 14.7,  hematocrit 42.3, platelet count was 283.  There was no significant left  shift.  Serum ketones were negative.  Sodium was 139, potassium 3.5,  chloride of 105, carbon dioxide was 27, glucose 212, BUN of 15, creatinine  1.3, calcium is 9.4.  Total protein 7.1, albumin 3.8.  Arterial blood gas on  room air, patient's pH is 7.4, PCO2 41.2, PO2 of 71.4, bicarbonate was 25,  PCO2 of 21.9, acid base excess was 0.8.  Oxygen SATs were 95%.   AST 19, ALT of 32, alkaline phosphatase 136, bilirubin was 0.6.  Lipase was  54.   X-rays of her abdomen shows normal bowel gas pattern with no evidence of  free peritoneal air.  There is mild cardiomegaly, no evidence of active  chest disease noted.   ASSESSMENT AND PLAN:  A 65 year old  African-American female with history  significant for hypertension and diabetes, presenting with nausea, vomiting  and diarrhea and some abdominal discomfort.  Symptoms are very consistent  with acute gastroenteritis, likely viral, and the probable source was from a  day care center where she worked.  Patient was noted to be pre-syncopal in  the emergency room when she was being discharged.  Plan is will admit her to  the medical floor and hydrate her at this time.  Will hold her blood  pressure medications for now and will set parameters for re-starting them if  systolic blood pressure goes above 150.  I will also hold oral hypoglycemic  agents now as her oral route is unpredictable.   Her lipase is only slightly elevated, just the upper limits of normal.  Will  repeat this in the morning.  Patient does not have any evidence of acute  cholecystitis at this time.  Would continue to monitor her closely and  reexamine her abdomen in the morning.   Would replace potassium as she is borderline-low.   I discussed the above with the patient who verbalized full understanding.  I  also explained the role of the hospitalist's service to her.   DISPOSITION:  I anticipate discharge home in the next one to two days.     Ayor   AM/MEDQ  D:  03/24/2004  T:  03/25/2004  Job:  [id number]

## 2011-06-01 ENCOUNTER — Inpatient Hospital Stay (HOSPITAL_COMMUNITY)
Admission: EM | Admit: 2011-06-01 | Discharge: 2011-06-04 | DRG: 313 | Disposition: A | Discharge status: Home / Self Care | Payer: Medicare Other | Attending: Internal Medicine | Admitting: Internal Medicine

## 2011-06-01 ENCOUNTER — Encounter (HOSPITAL_COMMUNITY): Payer: Self-pay | Admitting: *Deleted

## 2011-06-01 ENCOUNTER — Other Ambulatory Visit: Payer: Self-pay

## 2011-06-01 ENCOUNTER — Emergency Department (HOSPITAL_COMMUNITY): Payer: Medicare Other

## 2011-06-01 DIAGNOSIS — E876 Hypokalemia: Secondary | ICD-10-CM | POA: Diagnosis not present

## 2011-06-01 DIAGNOSIS — Z951 Presence of aortocoronary bypass graft: Secondary | ICD-10-CM

## 2011-06-01 DIAGNOSIS — R079 Chest pain, unspecified: Secondary | ICD-10-CM | POA: Diagnosis present

## 2011-06-01 DIAGNOSIS — I129 Hypertensive chronic kidney disease with stage 1 through stage 4 chronic kidney disease, or unspecified chronic kidney disease: Secondary | ICD-10-CM | POA: Diagnosis present

## 2011-06-01 DIAGNOSIS — Z79899 Other long term (current) drug therapy: Secondary | ICD-10-CM

## 2011-06-01 DIAGNOSIS — I517 Cardiomegaly: Secondary | ICD-10-CM

## 2011-06-01 DIAGNOSIS — Z23 Encounter for immunization: Secondary | ICD-10-CM

## 2011-06-01 DIAGNOSIS — E119 Type 2 diabetes mellitus without complications: Secondary | ICD-10-CM

## 2011-06-01 DIAGNOSIS — N189 Chronic kidney disease, unspecified: Secondary | ICD-10-CM

## 2011-06-01 DIAGNOSIS — I10 Essential (primary) hypertension: Secondary | ICD-10-CM

## 2011-06-01 DIAGNOSIS — M129 Arthropathy, unspecified: Secondary | ICD-10-CM | POA: Diagnosis present

## 2011-06-01 DIAGNOSIS — I2510 Atherosclerotic heart disease of native coronary artery without angina pectoris: Secondary | ICD-10-CM | POA: Diagnosis present

## 2011-06-01 DIAGNOSIS — R0789 Other chest pain: Principal | ICD-10-CM | POA: Diagnosis present

## 2011-06-01 DIAGNOSIS — Z794 Long term (current) use of insulin: Secondary | ICD-10-CM

## 2011-06-01 DIAGNOSIS — I252 Old myocardial infarction: Secondary | ICD-10-CM

## 2011-06-01 DIAGNOSIS — E669 Obesity, unspecified: Secondary | ICD-10-CM | POA: Diagnosis present

## 2011-06-01 DIAGNOSIS — E785 Hyperlipidemia, unspecified: Secondary | ICD-10-CM | POA: Diagnosis present

## 2011-06-01 HISTORY — DX: Unspecified osteoarthritis, unspecified site: M19.90

## 2011-06-01 LAB — COMPREHENSIVE METABOLIC PANEL
ALT: 16 U/L (ref 0–35)
AST: 15 U/L (ref 0–37)
Albumin: 4.1 g/dL (ref 3.5–5.2)
Alkaline Phosphatase: 112 U/L (ref 39–117)
BUN: 21 mg/dL (ref 6–23)
CO2: 28 mEq/L (ref 19–32)
Calcium: 10 mg/dL (ref 8.4–10.5)
Chloride: 102 mEq/L (ref 96–112)
Creatinine, Ser: 1.49 mg/dL — ABNORMAL HIGH (ref 0.50–1.10)
GFR calc Af Amer: 41 mL/min — ABNORMAL LOW (ref >90)
GFR calc non Af Amer: 35 mL/min — ABNORMAL LOW (ref >90)
Glucose, Bld: 156 mg/dL — ABNORMAL HIGH (ref 70–99)
Potassium: 3.6 mEq/L (ref 3.5–5.1)
Sodium: 140 mEq/L (ref 135–145)
Total Bilirubin: 0.3 mg/dL (ref 0.3–1.2)
Total Protein: 7.9 g/dL (ref 6.0–8.3)

## 2011-06-01 LAB — URINE MICROSCOPIC-ADD ON

## 2011-06-01 LAB — URINALYSIS, ROUTINE W REFLEX MICROSCOPIC
Bilirubin Urine: NEGATIVE
Glucose, UA: NEGATIVE mg/dL
Ketones, ur: NEGATIVE mg/dL
Leukocytes, UA: NEGATIVE
Nitrite: NEGATIVE
Specific Gravity, Urine: <1.005 — ABNORMAL LOW (ref 1.005–1.030)
Urobilinogen, UA: 0.2 mg/dL (ref 0.0–1.0)
pH: 6 (ref 5.0–8.0)

## 2011-06-01 LAB — DIFFERENTIAL
Basophils Absolute: 0 10*3/uL (ref 0.0–0.1)
Eosinophils Absolute: 0.1 10*3/uL (ref 0.0–0.7)
Eosinophils Relative: 1 % (ref 0–5)
Lymphocytes Relative: 16 % (ref 12–46)
Neutrophils Relative %: 76 % (ref 43–77)

## 2011-06-01 LAB — CBC
MCH: 29.5 pg (ref 26.0–34.0)
MCV: 87.3 fL (ref 78.0–100.0)
Platelets: 245 10*3/uL (ref 150–400)
RBC: 4.82 MIL/uL (ref 3.87–5.11)
RDW: 12.4 % (ref 11.5–15.5)
WBC: 9.6 10*3/uL (ref 4.0–10.5)

## 2011-06-01 LAB — TROPONIN I: Troponin I: <0.3 ng/mL (ref <0.30)

## 2011-06-01 LAB — CARDIAC PANEL(CRET KIN+CKTOT+MB+TROPI)
CK, MB: 2.3 ng/mL (ref 0.3–4.0)
Total CK: 126 U/L (ref 7–177)

## 2011-06-01 IMAGING — CR DG CHEST 1V PORT
1 series · 1 of 1 positions shown · non-contrast
Comparison: [DATE]

CLINICAL DATA: 65-year-old female with chest pain and nausea.

PORTABLE CHEST - 1 VIEW

[view not recorded]
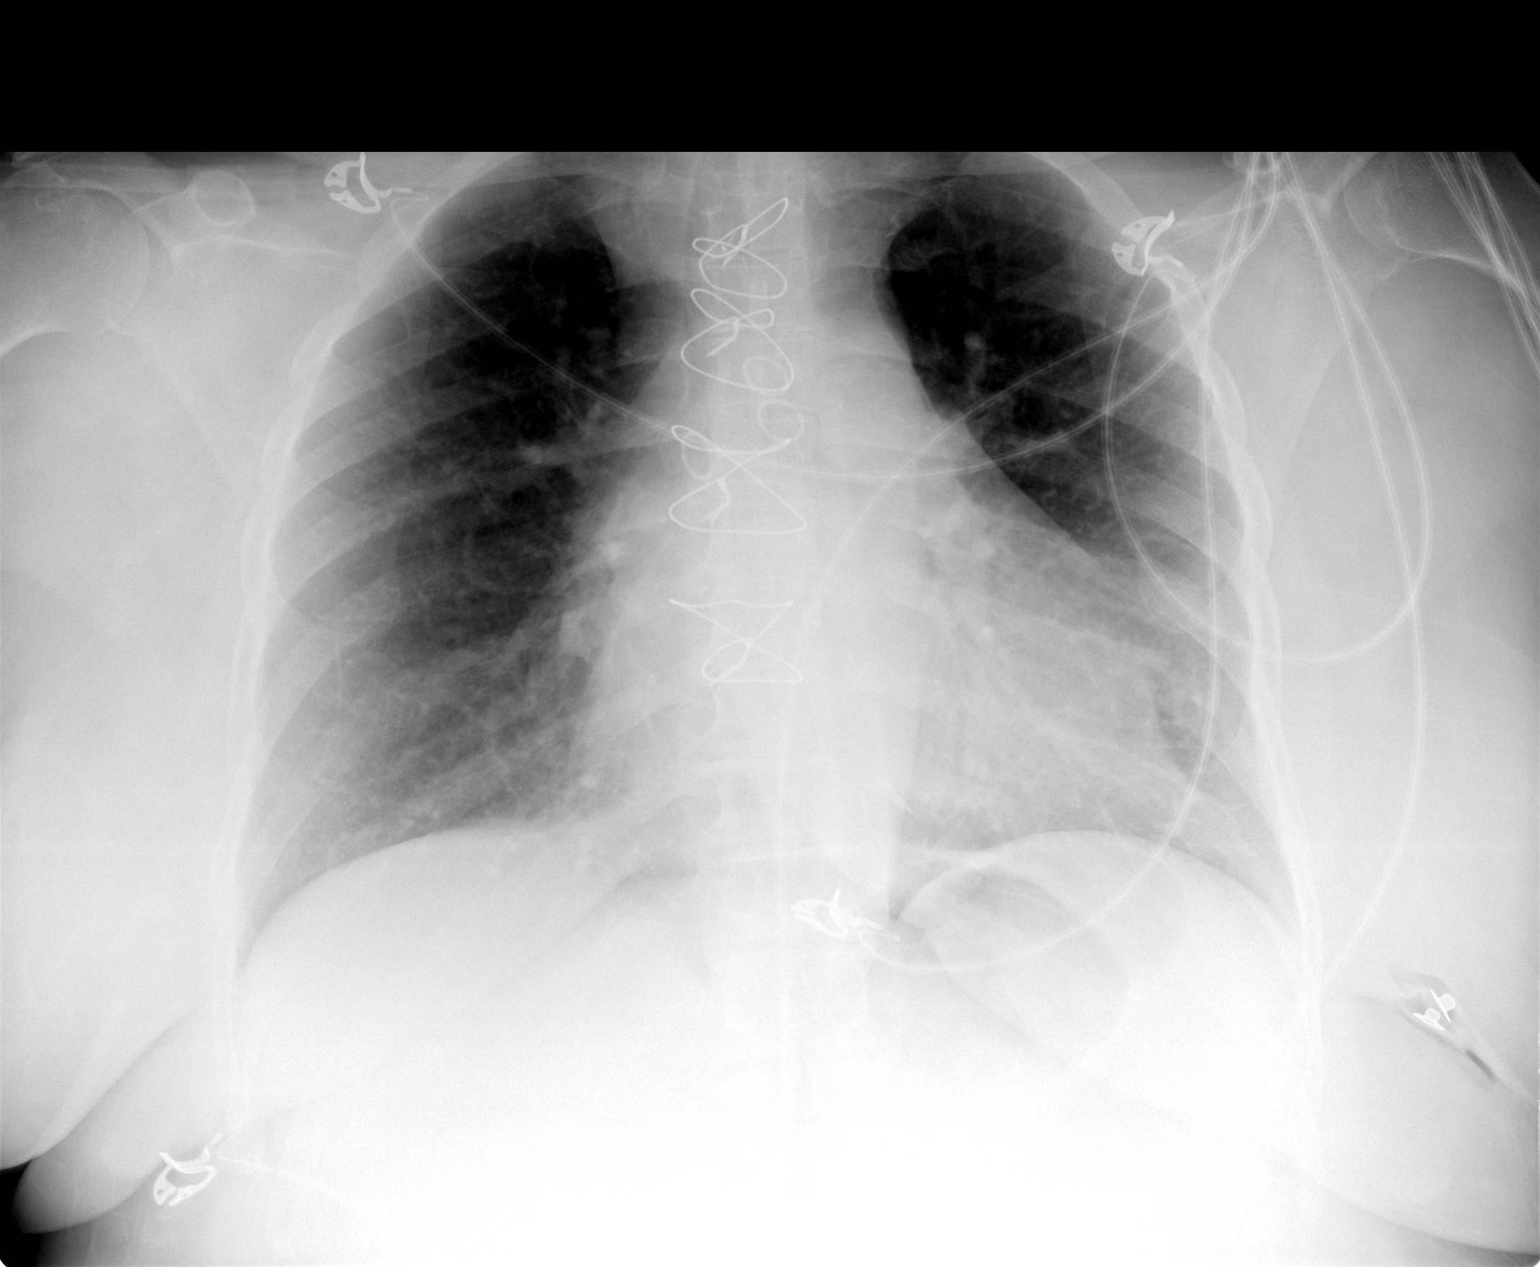

[1 of 1 positions shown; findings below may reference images not displayed]

FINDINGS: Cardiomegaly and pulmonary vascular congestion
identified.
CABG changes are present.
There is no evidence of focal airspace disease, pulmonary edema,
suspicious pulmonary nodule/mass, pleural effusion, or
pneumothorax.
No acute bony abnormalities are identified.
IMPRESSION: Cardiomegaly with pulmonary vascular congestion.

## 2011-06-01 MED ORDER — PANTOPRAZOLE SODIUM 40 MG IV SOLR
40.0000 mg | Freq: Once | INTRAVENOUS | Status: AC
  Administered 2011-06-01: 40 mg via INTRAVENOUS
  Filled 2011-06-01: qty 40

## 2011-06-01 MED ORDER — AMLODIPINE BESYLATE 5 MG PO TABS
5.0000 mg | ORAL_TABLET | Freq: Every day | ORAL | Status: DC
  Administered 2011-06-02 – 2011-06-03 (×2): 5 mg via ORAL
  Filled 2011-06-01 (×3): qty 1

## 2011-06-01 MED ORDER — GLIPIZIDE 5 MG PO TABS
10.0000 mg | ORAL_TABLET | Freq: Every day | ORAL | Status: DC
  Administered 2011-06-02: 10 mg via ORAL
  Filled 2011-06-01: qty 1
  Filled 2011-06-01: qty 2

## 2011-06-01 MED ORDER — SODIUM CHLORIDE 0.9 % IJ SOLN
3.0000 mL | Freq: Two times a day (BID) | INTRAMUSCULAR | Status: DC
  Administered 2011-06-01 – 2011-06-03 (×5): 3 mL via INTRAVENOUS
  Filled 2011-06-01 (×6): qty 3

## 2011-06-01 MED ORDER — LISINOPRIL 10 MG PO TABS
20.0000 mg | ORAL_TABLET | Freq: Every day | ORAL | Status: DC
  Administered 2011-06-02 – 2011-06-03 (×2): 20 mg via ORAL
  Filled 2011-06-01 (×3): qty 2

## 2011-06-01 MED ORDER — ASPIRIN EC 81 MG PO TBEC
81.0000 mg | DELAYED_RELEASE_TABLET | Freq: Every day | ORAL | Status: DC
  Administered 2011-06-02 – 2011-06-03 (×2): 81 mg via ORAL
  Filled 2011-06-01 (×3): qty 1

## 2011-06-01 MED ORDER — NITROGLYCERIN 2 % TD OINT
0.5000 [in_us] | TOPICAL_OINTMENT | Freq: Three times a day (TID) | TRANSDERMAL | Status: DC
  Administered 2011-06-01 – 2011-06-02 (×2): 0.5 [in_us] via TOPICAL
  Filled 2011-06-01 (×2): qty 1

## 2011-06-01 MED ORDER — ONDANSETRON HCL 4 MG/2ML IJ SOLN: 4.0000 mg | Freq: Four times a day (QID) | INTRAMUSCULAR | Status: DC | PRN

## 2011-06-01 MED ORDER — HYDROCHLOROTHIAZIDE 12.5 MG PO CAPS
12.5000 mg | ORAL_CAPSULE | Freq: Every day | ORAL | Status: DC
  Administered 2011-06-02 – 2011-06-03 (×2): 12.5 mg via ORAL
  Filled 2011-06-01 (×2): qty 1

## 2011-06-01 MED ORDER — SIMVASTATIN 20 MG PO TABS
20.0000 mg | ORAL_TABLET | Freq: Every day | ORAL | Status: DC
  Administered 2011-06-01 – 2011-06-02 (×2): 20 mg via ORAL
  Filled 2011-06-01 (×2): qty 1

## 2011-06-01 MED ORDER — INSULIN NPH (HUMAN) (ISOPHANE) 100 UNIT/ML ~~LOC~~ SUSP
15.0000 [IU] | Freq: Two times a day (BID) | SUBCUTANEOUS | Status: DC
  Administered 2011-06-01 – 2011-06-03 (×5): 15 [IU] via SUBCUTANEOUS
  Filled 2011-06-01: qty 10

## 2011-06-01 MED ORDER — ENOXAPARIN SODIUM 80 MG/0.8ML ~~LOC~~ SOLN
1.0000 mg/kg | Freq: Two times a day (BID) | SUBCUTANEOUS | Status: DC
  Administered 2011-06-01: 80 mg via SUBCUTANEOUS
  Filled 2011-06-01: qty 0.8

## 2011-06-01 MED ORDER — SODIUM CHLORIDE 0.9 % IV SOLN: 250.0000 mL | INTRAVENOUS | Status: DC | PRN

## 2011-06-01 MED ORDER — POTASSIUM CHLORIDE CRYS ER 10 MEQ PO TBCR
10.0000 meq | EXTENDED_RELEASE_TABLET | Freq: Two times a day (BID) | ORAL | Status: DC
  Administered 2011-06-01 – 2011-06-02 (×2): 10 meq via ORAL
  Filled 2011-06-01 (×2): qty 1

## 2011-06-01 MED ORDER — LISINOPRIL-HYDROCHLOROTHIAZIDE 20-12.5 MG PO TABS: 1.0000 | ORAL_TABLET | Freq: Every day | ORAL | Status: DC

## 2011-06-01 MED ORDER — ISOSORBIDE DINITRATE 20 MG PO TABS
30.0000 mg | ORAL_TABLET | Freq: Every morning | ORAL | Status: DC
  Administered 2011-06-02 – 2011-06-03 (×2): 30 mg via ORAL
  Filled 2011-06-01 (×2): qty 2

## 2011-06-01 MED ORDER — ASPIRIN 300 MG RE SUPP
300.0000 mg | RECTAL | Status: AC
  Filled 2011-06-01: qty 1

## 2011-06-01 MED ORDER — NITROGLYCERIN 0.4 MG SL SUBL: 0.4000 mg | SUBLINGUAL_TABLET | SUBLINGUAL | Status: DC | PRN

## 2011-06-01 MED ORDER — INSULIN NPH (HUMAN) (ISOPHANE) 100 UNIT/ML ~~LOC~~ SUSP
SUBCUTANEOUS | Status: AC
  Filled 2011-06-01: qty 1

## 2011-06-01 MED ORDER — SODIUM CHLORIDE 0.9 % IJ SOLN: 3.0000 mL | INTRAMUSCULAR | Status: DC | PRN

## 2011-06-01 MED ORDER — ASPIRIN 81 MG PO CHEW
324.0000 mg | CHEWABLE_TABLET | ORAL | Status: AC
  Administered 2011-06-01: 324 mg via ORAL
  Filled 2011-06-01: qty 4

## 2011-06-01 MED ORDER — ACETAMINOPHEN 325 MG PO TABS: 650.0000 mg | ORAL_TABLET | ORAL | Status: DC | PRN

## 2011-06-01 NOTE — H&P (Signed)
Chief Complaint:  Chest pain  HPI: 66 year old female with a history of CABG in 2006 who presents with atypical chest pain that she describes as heartburn that radiates up into her jaw. She states that previously when general heart attack she thought it was heartburn and this feels similar to that and then after she had her CABG her heartburn when away. She had one episode of vomiting this morning. She received nitroglycerin and had some relief of her chest pain. She denies any shortness of breath or cough with it. She denies any fevers, epigastric pain. She has not seen a cardiologist since 2009. She currently is chest pain-free and denies any lower extremity edema.  Review of Systems:  Otherwise negative  Past Medical History: Past Medical History  Diagnosis Date  . MI (myocardial infarction) 2006  . Arthritis   . Diabetes mellitus   . Hypertension   . Renal disorder    Past Surgical History  Procedure Date  . Coronary artery bypass graft   . Abdominal hysterectomy     Medications: Prior to Admission medications   Medication Sig Start Date End Date Taking? Authorizing Provider  amLODipine (NORVASC) 5 MG tablet Take 5 mg by mouth daily.   Yes Historical Provider, MD  glipiZIDE (GLUCOTROL) 10 MG tablet Take 10 mg by mouth daily.   Yes Historical Provider, MD  insulin NPH (HUMULIN N,NOVOLIN N) 100 UNIT/ML injection Inject 15 Units into the skin 2 (two) times daily.   Yes Historical Provider, MD  isosorbide dinitrate (ISORDIL) 30 MG tablet Take 30 mg by mouth every morning.   Yes Historical Provider, MD  lisinopril-hydrochlorothiazide (PRINZIDE,ZESTORETIC) 20-12.5 MG per tablet Take 1 tablet by mouth daily.   Yes Historical Provider, MD  lovastatin (MEVACOR) 40 MG tablet Take 40 mg by mouth at bedtime.   Yes Historical Provider, MD  potassium chloride (K-DUR,KLOR-CON) 10 MEQ tablet Take 10 mEq by mouth 2 (two) times daily.   Yes Historical Provider, MD    Allergies:  No Known  Allergies  Social History:  reports that she has never smoked. She does not have any smokeless tobacco history on file. She reports that she does not drink alcohol or use illicit drugs.  Family History: History reviewed. No pertinent family history.  Physical Exam: Filed Vitals:   06/01/11 1620 06/01/11 1700 06/01/11 1800 06/01/11 1900  BP: 159/75 150/68 130/58 144/84  Pulse: 83 76 83 76  Temp:      TempSrc:      Resp: 21  15 18   Height:      Weight:      SpO2: 100% 100% 100% 100%   BP 143/125  Pulse 76  Temp(Src) 98.3 F (36.8 C) (Oral)  Resp 20  Ht 5\' 2"  (1.575 m)  Wt 79.379 kg (175 lb)  BMI 32.01 kg/m2  SpO2 100% General appearance: alert, cooperative and no distress Lungs: clear to auscultation bilaterally Heart: regular rate and rhythm, S1, S2 normal, no murmur, click, rub or gallop Abdomen: soft, non-tender; bowel sounds normal; no masses,  no organomegaly Extremities: extremities normal, atraumatic, no cyanosis or edema Pulses: 2+ and symmetric Skin: Skin color, texture, turgor normal. No rashes or lesions Neurologic: Grossly normal    Labs on Admission:   [facility] 06/01/11 1544  NA 140  K 3.6  CL 102  CO2 28  GLUCOSE 156*  BUN 21  CREATININE 1.49*  CALCIUM 10.0  MG --  PHOS --    Basename 06/01/11 1544  AST 15  ALT 16  ALKPHOS 112  BILITOT 0.3  PROT 7.9  ALBUMIN 4.1    Basename 06/01/11 1544  WBC 9.6  NEUTROABS 7.3  HGB 14.2  HCT 42.1  MCV 87.3  PLT 245    Basename 06/01/11 1544  CKTOTAL --  CKMB --  CKMBINDEX --  TROPONINI <0.30    Radiological Exams on Admission: Dg Chest Port 1 View  06/01/2011  *RADIOLOGY REPORT*  Clinical Data: 65 year old female with chest pain and nausea.  PORTABLE CHEST - 1 VIEW  Comparison: 03/24/2004  Findings: Cardiomegaly and pulmonary vascular congestion identified. CABG changes are present. There is no evidence of focal airspace disease, pulmonary edema, suspicious pulmonary nodule/mass, pleural  effusion, or pneumothorax. No acute bony abnormalities are identified.  IMPRESSION: Cardiomegaly with pulmonary vascular congestion.  Original Report Authenticated By: Lura Em, M.D.    Assessment/Plan Present on Admission:  66 year old female with atypical chest pain which sounds like anginal equivalent who is status post CABG with multiple other risk factors  .Chest pain .CKD (chronic kidney disease) .DM (diabetes mellitus)   EKG and cardiac enzymes are negative. I will manage her for ACS until she rules out. Place her on full dose Lovenox and nitroglycerin paste. obtain a cardiology consultation for recommendations for further workup. Her current indigestion actually sounds like her previous anginal symptoms so we'll assume this is cardiac related until proven otherwise particularly with her multiple risk factors and lack of appropriate followup as an outpatient. Check fasting lipid panel in the morning and hemoglobin A1c. We'll serial cardiac enzymes and place on telemetry floor. Patient is full code.   Mirtie Bastyr A [id number] 06/01/2011, 7:48 PM

## 2011-06-01 NOTE — ED Provider Notes (Signed)
History  This chart was scribed for Erin Blade, MD by Jenne Campus. This patient was seen in room APA17/APA17 and the patient's care was started at 3:15PM.  CSN: [id number]  Arrival date & time 06/01/11  1420   First MD Initiated Contact with Patient 06/01/11 1509      Chief Complaint  Patient presents with  . Chest Pain    The history is provided by the patient. No language interpreter was used.    Erin Jordan is a 66 y.o. female who presents to the Emergency Department complaining of gradual onset, gradually improving chest discomfort described as indigestion that started last night before she went to bed. She rates the indigestion an 8 out of 10 at its worse and a 2 or 3 out of 10 currently. She reports taking an ASA with mild improvement in symptoms. She also c/o dizziness with one episode of emesis that started 30 minutes after she woke up this morning. She states that she walked into her kitchen, felt off balance and sat down for a while. She then states that she took normal dose of morning pills and ate breakfast which she then vomited back up a short while later. She describes the dizziness as being a spinning sensation that she states is worse with sitting. Pt reports that the dizziness has since resolved upon arrival to the ED. Pt also states that she has had a cough and urge incontinence for the past couple of days. Pt reports that at baseline she has decreased vision and walks with a cane. She denies fever, hematuria, HA, constipation and diarrhea as associated symptoms. Pt has a h/o MI, diabetes and HTN. She denies smoking and alcohol use.  Dr. Wenda Overland is Pt's PCP. Pt saw Cardiologist Dr. Olga Millers in [place] after MI.  Past Medical History  Diagnosis Date  . MI (myocardial infarction) 2006  . Arthritis   . Diabetes mellitus   . Hypertension   . Renal disorder     Past Surgical History  Procedure Date  . Coronary artery bypass graft   . Abdominal hysterectomy       History reviewed. No pertinent family history.  History  Substance Use Topics  . Smoking status: Never Smoker   . Smokeless tobacco: Not on file  . Alcohol Use: No    Review of Systems  A complete 10 system review of systems was obtained and is otherwise negative except as noted in the HPI.   Allergies  Review of patient's allergies indicates no known allergies.  Home Medications   Current Outpatient Rx  Name Route Sig Dispense Refill  . AMLODIPINE BESYLATE 5 MG PO TABS Oral Take 5 mg by mouth daily.    Marland Kitchen GLIPIZIDE 10 MG PO TABS Oral Take 10 mg by mouth daily.    . INSULIN ISOPHANE HUMAN 100 UNIT/ML [place] SUSP Subcutaneous Inject 15 Units into the skin 2 (two) times daily.    . ISOSORBIDE DINITRATE 30 MG PO TABS Oral Take 30 mg by mouth every morning.    Marland Kitchen LISINOPRIL-HYDROCHLOROTHIAZIDE 20-12.5 MG PO TABS Oral Take 1 tablet by mouth daily.    Marland Kitchen LOVASTATIN 40 MG PO TABS Oral Take 40 mg by mouth at bedtime.    Marland Kitchen POTASSIUM CHLORIDE CRYS ER 10 MEQ PO TBCR Oral Take 10 mEq by mouth 2 (two) times daily.      Triage Vitals: BP 159/71  Pulse 88  Temp(Src) 98.3 F (36.8 C) (Oral)  Resp 20  Ht 5'  2" (1.575 m)  Wt 175 lb (79.379 kg)  BMI 32.01 kg/m2  SpO2 100%  Physical Exam  Nursing note and vitals reviewed. Constitutional: She is oriented to person, place, and time. She appears well-developed and well-nourished.       Overnourished  HENT:  Head: Normocephalic and atraumatic.  Nose: Nose normal.  Mouth/Throat: Oropharynx is clear and moist.  Eyes: Conjunctivae and EOM are normal. Pupils are equal, round, and reactive to light.  Neck: Normal range of motion. Neck supple.  Cardiovascular: Normal rate, regular rhythm and normal heart sounds.  Exam reveals no gallop and no friction rub.   No murmur heard. Pulmonary/Chest: Effort normal. No respiratory distress. She has no wheezes. She has no rales. She exhibits no tenderness.       Coarse breath sounds, No rhonchi   Abdominal: Soft. Bowel sounds are normal. There is no tenderness. There is no rebound and no guarding.  Musculoskeletal: Normal range of motion. She exhibits no edema.       Strength normal in all extremities; no effusion, no edema, no large joint tenderness  Neurological: She is alert and oriented to person, place, and time. No cranial nerve deficit.  Skin: Skin is warm and dry.  Psychiatric: She has a normal mood and affect. Her behavior is normal.    ED Course  Procedures (including critical care time)  DIAGNOSTIC STUDIES: Oxygen Saturation is 100% on [place], normal by my interpretation.    COORDINATION OF CARE: 3:17PM-Pt states that the nitroglycerin improved her symptoms. 3:27PM-Discussed treatment plan with pt and pt agreed to plan. 6:55PM-Pt rechecked and states that she is feeling better. She still feels the indigestion discomfort but rates the discomfort a 2 out of 10. Discussed lab results and admission to the hospital with pt and pt agreed. Pt states that she is hungry and is requesting food. Protonix ordered IV for possible GERD  Labs Reviewed  COMPREHENSIVE METABOLIC PANEL - Abnormal; Notable for the following:    Glucose, Bld 156 (*)    Creatinine, Ser 1.49 (*)    GFR calc non Af Amer 35 (*)    GFR calc Af Amer 41 (*)    All other components within normal limits  URINALYSIS, ROUTINE W REFLEX MICROSCOPIC - Abnormal; Notable for the following:    Specific Gravity, Urine <1.005 (*)    Hgb urine dipstick TRACE (*)    Protein, ur TRACE (*)    All other components within normal limits  PRO B NATRIURETIC PEPTIDE - Abnormal; Notable for the following:    Pro B Natriuretic peptide (BNP) 166.2 (*)    All other components within normal limits  URINE MICROSCOPIC-ADD ON - Abnormal; Notable for the following:    Squamous Epithelial / LPF FEW (*)    All other components within normal limits  CBC  DIFFERENTIAL  TROPONIN I  D-DIMER, QUANTITATIVE  URINE CULTURE   Dg Chest Port 1  View  06/01/2011  *RADIOLOGY REPORT*  Clinical Data: 66 year old female with chest pain and nausea.  PORTABLE CHEST - 1 VIEW  Comparison: 03/24/2004  Findings: Cardiomegaly and pulmonary vascular congestion identified. CABG changes are present. There is no evidence of focal airspace disease, pulmonary edema, suspicious pulmonary nodule/mass, pleural effusion, or pneumothorax. No acute bony abnormalities are identified.  IMPRESSION: Cardiomegaly with pulmonary vascular congestion.  Original Report Authenticated By: Lura Em, M.D.     1. Chest pain       MDM  Nonspecific chest pain, unlikely to be cardiac  they sound duration and associated. Suspect GERD. Patient has limited access to followup and has not seen a cardiologist years. She would be best served by admission for formal rule out MI, and possibly risk stratification procedure.      I personally performed the services described in this documentation, which was scribed in my presence. The recorded information has been reviewed and considered.     Erin Blade, MD 06/01/11 1945

## 2011-06-01 NOTE — ED Notes (Signed)
Pt comes from home today via ems d/t cp. Pt states she took her insulin shot today and then threw up and began to hurt in center of her chest. Pt was given nitro 0.4mg  sl aspirin 324mg  pt reported improvement of pain after nitro.

## 2011-06-01 NOTE — Progress Notes (Addendum)
ANTICOAGULATION CONSULT NOTE - Initial Consult  Pharmacy Consult for Lovenox therapeutic dose Indication: ACS  No Known Allergies  Patient Measurements: Height: 5\' 2"  (157.5 cm) Weight: 172 lb 9.9 oz (78.3 kg) IBW/kg (Calculated) : 50.1   Vital Signs: Temp: 98.5 F (36.9 C) (02/23 2159) Temp src: Oral (02/23 2159) BP: 135/74 mmHg (02/23 2159) Pulse Rate: 78  (02/23 2210)  Labs:  Basename 06/01/11 1544  HGB 14.2  HCT 42.1  PLT 245  APTT --  LABPROT --  INR --  HEPARINUNFRC --  CREATININE 1.49*  CKTOTAL --  CKMB --  TROPONINI <0.30   Estimated Creatinine Clearance: 35.5 ml/min (by C-G formula based on Cr of 1.49).  Medical History: Past Medical History  Diagnosis Date  . MI (myocardial infarction) 2006  . Arthritis   . Diabetes mellitus   . Hypertension   . Renal disorder     Medications:  Scheduled:    . amLODipine  5 mg Oral Daily  . aspirin  324 mg Oral NOW   Or  . aspirin  300 mg Rectal NOW  . aspirin EC  81 mg Oral Daily  . enoxaparin (LOVENOX) injection  1 mg/kg Subcutaneous Q12H  . glipiZIDE  10 mg Oral Daily  . lisinopril  20 mg Oral Daily   And  . hydrochlorothiazide  12.5 mg Oral Daily  . insulin NPH  15 Units Subcutaneous BID  . isosorbide dinitrate  30 mg Oral q morning - 10a  . nitroGLYCERIN  0.5 inch Topical Q8H  . pantoprazole  40 mg Intravenous Once  . potassium chloride  10 mEq Oral BID  . simvastatin  20 mg Oral QHS  . sodium chloride  3 mL Intravenous Q12H  . DISCONTD: lisinopril-hydrochlorothiazide  1 tablet Oral Daily    Assessment: Ok for protocol  Goal of Therapy:  Full dose anticoagulation   Plan:  Lovenox 1mg /kg every 12 hours Labs per protocol  Abner Greenspan, Reginold Beale Bennett 06/01/2011,11:15 PM

## 2011-06-01 NOTE — ED Notes (Signed)
Pt up to bedside commode. A nun cap was placed in commode to catch urine sample. Pt was advised to throw toilet paper in trash. Pt threw her toilet paper in the commode with urine. Urine was discarded due to not being sterile.

## 2011-06-01 NOTE — ED Provider Notes (Signed)
History     CSN: [id number]  Arrival date & time 06/01/11  1420   First MD Initiated Contact with Patient 06/01/11 [phone]      Chief Complaint  Patient presents with  . Chest Pain    (Consider location/radiation/quality/duration/timing/severity/associated sxs/prior treatment) HPI  Past Medical History  Diagnosis Date  . MI (myocardial infarction) 2006  . Arthritis   . Diabetes mellitus   . Hypertension   . Renal disorder     Past Surgical History  Procedure Date  . Coronary artery bypass graft   . Abdominal hysterectomy     History reviewed. No pertinent family history.  History  Substance Use Topics  . Smoking status: Never Smoker   . Smokeless tobacco: Not on file  . Alcohol Use: No    OB History    Grav Para Term Preterm Abortions TAB SAB Ect Mult Living                  Review of Systems  Allergies  Review of patient's allergies indicates no known allergies.  Home Medications  No current outpatient prescriptions on file.  BP 135/74  Pulse 78  Temp(Src) 98.5 F (36.9 C) (Oral)  Resp 20  Ht 5\' 2"  (1.575 m)  Wt 172 lb 9.9 oz (78.3 kg)  BMI 31.57 kg/m2  SpO2 98%  Physical Exam  ED Course  Procedures (including critical care time)  Date: 06/01/2011  Rate: 85  Rhythm: normal sinus rhythm  QRS Axis: normal  Intervals: normal  ST/T Wave abnormalities: nonspecific T wave changes  Conduction Disutrbances:none  Narrative Interpretation: Q wave inferiorly, poor R wave progression  Old EKG Reviewed: none available    Labs Reviewed  COMPREHENSIVE METABOLIC PANEL - Abnormal; Notable for the following:    Glucose, Bld 156 (*)    Creatinine, Ser 1.49 (*)    GFR calc non Af Amer 35 (*)    GFR calc Af Amer 41 (*)    All other components within normal limits  URINALYSIS, ROUTINE W REFLEX MICROSCOPIC - Abnormal; Notable for the following:    Specific Gravity, Urine <1.005 (*)    Hgb urine dipstick TRACE (*)    Protein, ur TRACE (*)    All other  components within normal limits  PRO B NATRIURETIC PEPTIDE - Abnormal; Notable for the following:    Pro B Natriuretic peptide (BNP) 166.2 (*)    All other components within normal limits  URINE MICROSCOPIC-ADD ON - Abnormal; Notable for the following:    Squamous Epithelial / LPF FEW (*)    All other components within normal limits  CBC  DIFFERENTIAL  TROPONIN I  D-DIMER, QUANTITATIVE  CARDIAC PANEL(CRET KIN+CKTOT+MB+TROPI)  URINE CULTURE  CARDIAC PANEL(CRET KIN+CKTOT+MB+TROPI)  CARDIAC PANEL(CRET KIN+CKTOT+MB+TROPI)  HEMOGLOBIN A1C  LIPID PANEL  CBC  BASIC METABOLIC PANEL   Dg Chest Port 1 View  06/01/2011  *RADIOLOGY REPORT*  Clinical Data: 66 year old female with chest pain and nausea.  PORTABLE CHEST - 1 VIEW  Comparison: 03/24/2004  Findings: Cardiomegaly and pulmonary vascular congestion identified. CABG changes are present. There is no evidence of focal airspace disease, pulmonary edema, suspicious pulmonary nodule/mass, pleural effusion, or pneumothorax. No acute bony abnormalities are identified.  IMPRESSION: Cardiomegaly with pulmonary vascular congestion.  Original Report Authenticated By: Lura Em, M.D.     1. Chest pain       MDM  Nonspecific chest pain in patient with cardiac risk factors and prior coronary artery disease with coronary artery bypass grafting.  She does not have ongoing cardiology management. She needs additional monitoring, MI rule out, and likely risk stratification procedure prior to discharge from the hospital        Richarda Blade, MD 06/02/11 [phone]

## 2011-06-02 ENCOUNTER — Encounter (HOSPITAL_COMMUNITY): Payer: Self-pay | Admitting: *Deleted

## 2011-06-02 DIAGNOSIS — E785 Hyperlipidemia, unspecified: Secondary | ICD-10-CM | POA: Diagnosis present

## 2011-06-02 DIAGNOSIS — E876 Hypokalemia: Secondary | ICD-10-CM | POA: Diagnosis not present

## 2011-06-02 DIAGNOSIS — E669 Obesity, unspecified: Secondary | ICD-10-CM | POA: Diagnosis present

## 2011-06-02 LAB — CBC
HCT: 39.2 % (ref 36.0–46.0)
Hemoglobin: 13.4 g/dL (ref 12.0–15.0)
MCH: 29.8 pg (ref 26.0–34.0)
MCHC: 34.2 g/dL (ref 30.0–36.0)
MCV: 87.1 fL (ref 78.0–100.0)

## 2011-06-02 LAB — BASIC METABOLIC PANEL
BUN: 20 mg/dL (ref 6–23)
Calcium: 9.6 mg/dL (ref 8.4–10.5)
Creatinine, Ser: 1.42 mg/dL — ABNORMAL HIGH (ref 0.50–1.10)
GFR calc non Af Amer: 37 mL/min — ABNORMAL LOW (ref >90)
Glucose, Bld: 103 mg/dL — ABNORMAL HIGH (ref 70–99)
Potassium: 3.1 mEq/L — ABNORMAL LOW (ref 3.5–5.1)

## 2011-06-02 LAB — HEMOGLOBIN A1C: Hgb A1c MFr Bld: 7 % — ABNORMAL HIGH (ref <5.7)

## 2011-06-02 LAB — CARDIAC PANEL(CRET KIN+CKTOT+MB+TROPI): Relative Index: 2 (ref 0.0–2.5)

## 2011-06-02 LAB — LIPID PANEL
Cholesterol: 200 mg/dL (ref 0–200)
Triglycerides: 91 mg/dL (ref <150)

## 2011-06-02 LAB — GLUCOSE, CAPILLARY: Glucose-Capillary: 77 mg/dL (ref 70–99)

## 2011-06-02 MED ORDER — METOPROLOL TARTRATE 25 MG PO TABS
12.5000 mg | ORAL_TABLET | Freq: Two times a day (BID) | ORAL | Status: DC
  Administered 2011-06-02 – 2011-06-03 (×4): 12.5 mg via ORAL
  Filled 2011-06-02 (×5): qty 1

## 2011-06-02 MED ORDER — INFLUENZA VIRUS VACC SPLIT PF IM SUSP
0.5000 mL | INTRAMUSCULAR | Status: AC
  Administered 2011-06-03: 0.5 mL via INTRAMUSCULAR
  Filled 2011-06-02: qty 0.5

## 2011-06-02 MED ORDER — BIOTENE DRY MOUTH MT LIQD
15.0000 mL | Freq: Two times a day (BID) | OROMUCOSAL | Status: DC
  Administered 2011-06-02 – 2011-06-04 (×3): 15 mL via OROMUCOSAL

## 2011-06-02 MED ORDER — ENOXAPARIN SODIUM 40 MG/0.4ML ~~LOC~~ SOLN
40.0000 mg | Freq: Every day | SUBCUTANEOUS | Status: DC
  Administered 2011-06-03: 40 mg via SUBCUTANEOUS
  Filled 2011-06-02 (×2): qty 0.4

## 2011-06-02 MED ORDER — POTASSIUM CHLORIDE CRYS ER 20 MEQ PO TBCR
40.0000 meq | EXTENDED_RELEASE_TABLET | Freq: Two times a day (BID) | ORAL | Status: DC
  Administered 2011-06-02 – 2011-06-03 (×3): 40 meq via ORAL
  Filled 2011-06-02 (×4): qty 2

## 2011-06-02 NOTE — Progress Notes (Addendum)
Chart reviewed.  Subjective: No further chest pain. No other symptoms. Tolerating diet. Reports that she's for the large part compliant with medications but sometimes noncompliant with diet.  Objective: Vital signs in last 24 hours: Filed Vitals:   06/01/11 2159 06/01/11 2210 06/01/11 2245 06/02/11 0532  BP: 135/74   145/78  Pulse: 98 78  75  Temp: 98.5 F (36.9 C)   98.3 F (36.8 C)  TempSrc: Oral   Oral  Resp: 22 20  19   Height: 5\' 2"  (1.575 m)     Weight: 78.3 kg (172 lb 9.9 oz)   78.79 kg (173 lb 11.2 oz)  SpO2: 100% 98% 98% 100%   Weight change:   Intake/Output Summary (Last 24 hours) at 06/02/11 1029 Last data filed at 06/01/11 2254  Gross per 24 hour  Intake    243 ml  Output      0 ml  Net    243 ml   Physical Exam: General: Comfortable. Talking on the telephone. Lungs clear to auscultation bilaterally without wheeze rhonchi or rales Cardiovascular regular rate rhythm without murmurs gas rubs Abdomen obese soft nontender Extremities no clubbing cyanosis or edema  Lab Results: Basic Metabolic Panel:  Lab Q000111Q 0430 06/01/11 1544  NA 140 140  K 3.1* 3.6  CL 105 102  CO2 25 28  GLUCOSE 103* 156*  BUN 20 21  CREATININE 1.42* 1.49*  CALCIUM 9.6 10.0  MG -- --  PHOS -- --   Liver Function Tests:  Lab 06/01/11 1544  AST 15  ALT 16  ALKPHOS 112  BILITOT 0.3  PROT 7.9  ALBUMIN 4.1   No results found for this basename: LIPASE:2,AMYLASE:2 in the last 168 hours No results found for this basename: AMMONIA:2 in the last 168 hours CBC:  Lab 06/02/11 0430 06/01/11 1544  WBC 10.1 9.6  NEUTROABS -- 7.3  HGB 13.4 14.2  HCT 39.2 42.1  MCV 87.1 87.3  PLT 255 245   Cardiac Enzymes:  Lab 06/02/11 0430 06/01/11 2313 06/01/11 1544  CKTOTAL 116 126 --  CKMB 2.3 2.3 --  CKMBINDEX -- -- --  TROPONINI <0.30 <0.30 <0.30   BNP:  Lab 06/01/11 1544  PROBNP 166.2*   D-Dimer:  Lab 06/01/11 1544  DDIMER 0.37   CBG:  Lab 06/02/11 0743  GLUCAP 93    Hemoglobin A1C: No results found for this basename: HGBA1C in the last 168 hours Fasting Lipid Panel:  Lab 06/02/11 0431  CHOL 200  HDL 45  LDLCALC 137*  TRIG 91  CHOLHDL 4.4  LDLDIRECT --   Thyroid Function Tests: No results found for this basename: TSH,T4TOTAL,FREET4,T3FREE,THYROIDAB in the last 168 hours Coagulation: No results found for this basename: LABPROT:4,INR:4 in the last 168 hours Anemia Panel: No results found for this basename: VITAMINB12,FOLATE,FERRITIN,TIBC,IRON,RETICCTPCT in the last 168 hours Urine Drug Screen: Drugs of Abuse  No results found for this basename: labopia, cocainscrnur, labbenz, amphetmu, thcu, labbarb    Alcohol Level: No results found for this basename: ETH:2 in the last 168 hours Urinalysis:  Lab 06/01/11 1822  COLORURINE YELLOW  LABSPEC <1.005*  PHURINE 6.0  GLUCOSEU NEGATIVE  HGBUR TRACE*  BILIRUBINUR NEGATIVE  KETONESUR NEGATIVE  PROTEINUR TRACE*  UROBILINOGEN 0.2  NITRITE NEGATIVE  LEUKOCYTESUR NEGATIVE    Micro Results: No results found for this or any previous visit (from the past 240 hour(s)). Studies/Results: Dg Chest Port 1 View  06/01/2011  *RADIOLOGY REPORT*  Clinical Data: 66 year old female with chest pain and nausea.  PORTABLE CHEST - 1 VIEW  Comparison: 03/24/2004  Findings: Cardiomegaly and pulmonary vascular congestion identified. CABG changes are present. There is no evidence of focal airspace disease, pulmonary edema, suspicious pulmonary nodule/mass, pleural effusion, or pneumothorax. No acute bony abnormalities are identified.  IMPRESSION: Cardiomegaly with pulmonary vascular congestion.  Original Report Authenticated By: Lura Em, M.D.   Scheduled Meds:   . amLODipine  5 mg Oral Daily  . aspirin  324 mg Oral NOW   Or  . aspirin  300 mg Rectal NOW  . aspirin EC  81 mg Oral Daily  . enoxaparin (LOVENOX) injection  40 mg Subcutaneous Daily  . glipiZIDE  10 mg Oral Daily  . lisinopril  20 mg Oral  Daily   And  . hydrochlorothiazide  12.5 mg Oral Daily  . influenza  inactive virus vaccine  0.5 mL Intramuscular Tomorrow-1000  . insulin NPH  15 Units Subcutaneous BID  . isosorbide dinitrate  30 mg Oral q morning - 10a  . pantoprazole  40 mg Intravenous Once  . potassium chloride  40 mEq Oral BID  . simvastatin  20 mg Oral QHS  . sodium chloride  3 mL Intravenous Q12H  . DISCONTD: enoxaparin (LOVENOX) injection  1 mg/kg Subcutaneous Q12H  . DISCONTD: lisinopril-hydrochlorothiazide  1 tablet Oral Daily  . DISCONTD: nitroGLYCERIN  0.5 inch Topical Q8H  . DISCONTD: potassium chloride  10 mEq Oral BID   Continuous Infusions:  PRN Meds:.sodium chloride, acetaminophen, nitroGLYCERIN, ondansetron (ZOFRAN) IV, sodium chloride Assessment/Plan: Principal Problem:  *Chest pain Active Problems:  DM (diabetes mellitus)  Hypokalemia  Benign hypertension  S/P CABG (coronary artery bypass graft)  CKD (chronic kidney disease)  Obesity  Hyperlipidemia  MI has been ruled out, but needs ischemic workup. Will keep in house until a cardiology evaluation has been obtained. Change Lovenox to DVT prophylaxis dose. Stop nitro paste. Continue aspirin. Add beta blocker. LDL is not optimally controlled despite statin therapy. Replete potassium   LOS: 1 day   Imari Sivertsen L 06/02/2011, 10:29 AM

## 2011-06-03 ENCOUNTER — Encounter (HOSPITAL_COMMUNITY): Payer: Self-pay | Admitting: Adult Health

## 2011-06-03 DIAGNOSIS — I517 Cardiomegaly: Secondary | ICD-10-CM

## 2011-06-03 DIAGNOSIS — R079 Chest pain, unspecified: Secondary | ICD-10-CM

## 2011-06-03 LAB — GLUCOSE, CAPILLARY
Glucose-Capillary: 76 mg/dL (ref 70–99)
Glucose-Capillary: 117 mg/dL — ABNORMAL HIGH (ref 70–99)
Glucose-Capillary: 147 mg/dL — ABNORMAL HIGH (ref 70–99)

## 2011-06-03 MED ORDER — ATORVASTATIN CALCIUM 40 MG PO TABS
40.0000 mg | ORAL_TABLET | Freq: Every day | ORAL | Status: DC
  Filled 2011-06-03: qty 1

## 2011-06-03 NOTE — Progress Notes (Signed)
Subjective: No chest pain. Feels fine.  Objective: Vital signs in last 24 hours: Filed Vitals:   06/03/11 0502 06/03/11 0546 06/03/11 0821 06/03/11 1430  BP: 171/91 152/86  129/69  Pulse: 80   83  Temp: 98.1 F (36.7 C)   98.7 F (37.1 C)  TempSrc: Oral   Oral  Resp: 20   20  Height:      Weight:      SpO2: 100%  99% 100%   Weight change:   Intake/Output Summary (Last 24 hours) at 06/03/11 1610 Last data filed at 06/03/11 1200  Gross per 24 hour  Intake    993 ml  Output   1600 ml  Net   -607 ml   Physical Exam: General: Comfortable.  Lungs clear to auscultation bilaterally without wheeze rhonchi or rales Cardiovascular regular rate rhythm without murmurs gas rubs Abdomen obese soft nontender Extremities no clubbing cyanosis or edema  Lab Results: Basic Metabolic Panel:  Lab Q000111Q 0430 06/01/11 1544  NA 140 140  K 3.1* 3.6  CL 105 102  CO2 25 28  GLUCOSE 103* 156*  BUN 20 21  CREATININE 1.42* 1.49*  CALCIUM 9.6 10.0  MG -- --  PHOS -- --   Liver Function Tests:  Lab 06/01/11 1544  AST 15  ALT 16  ALKPHOS 112  BILITOT 0.3  PROT 7.9  ALBUMIN 4.1   No results found for this basename: LIPASE:2,AMYLASE:2 in the last 168 hours No results found for this basename: AMMONIA:2 in the last 168 hours CBC:  Lab 06/02/11 0430 06/01/11 1544  WBC 10.1 9.6  NEUTROABS -- 7.3  HGB 13.4 14.2  HCT 39.2 42.1  MCV 87.1 87.3  PLT 255 245   Cardiac Enzymes:  Lab 06/02/11 0430 06/01/11 2313 06/01/11 1544  CKTOTAL 116 126 --  CKMB 2.3 2.3 --  CKMBINDEX -- -- --  TROPONINI <0.30 <0.30 <0.30   BNP:  Lab 06/01/11 1544  PROBNP 166.2*   D-Dimer:  Lab 06/01/11 1544  DDIMER 0.37   CBG:  Lab 06/03/11 1155 06/03/11 0741 06/02/11 2118 06/02/11 1652 06/02/11 0743  GLUCAP 117* 92 96 77 93   Hemoglobin A1C:  Lab 06/01/11 2313  HGBA1C 7.0*   Fasting Lipid Panel:  Lab 06/02/11 0431  CHOL 200  HDL 45  LDLCALC 137*  TRIG 91  CHOLHDL 4.4  LDLDIRECT  --   Thyroid Function Tests: No results found for this basename: TSH,T4TOTAL,FREET4,T3FREE,THYROIDAB in the last 168 hours Coagulation: No results found for this basename: LABPROT:4,INR:4 in the last 168 hours Anemia Panel: No results found for this basename: VITAMINB12,FOLATE,FERRITIN,TIBC,IRON,RETICCTPCT in the last 168 hours Urine Drug Screen: Drugs of Abuse  No results found for this basename: labopia,  cocainscrnur,  labbenz,  amphetmu,  thcu,  labbarb    Alcohol Level: No results found for this basename: ETH:2 in the last 168 hours Urinalysis:  Lab 06/01/11 1822  COLORURINE YELLOW  LABSPEC <1.005*  PHURINE 6.0  GLUCOSEU NEGATIVE  HGBUR TRACE*  BILIRUBINUR NEGATIVE  KETONESUR NEGATIVE  PROTEINUR TRACE*  UROBILINOGEN 0.2  NITRITE NEGATIVE  LEUKOCYTESUR NEGATIVE    Micro Results: No results found for this or any previous visit (from the past 240 hour(s)). Studies/Results: No results found. Scheduled Meds:    . amLODipine  5 mg Oral Daily  . antiseptic oral rinse  15 mL Mouth Rinse BID  . aspirin EC  81 mg Oral Daily  . enoxaparin (LOVENOX) injection  40 mg Subcutaneous Daily  . glipiZIDE  10 mg Oral Daily  . lisinopril  20 mg Oral Daily   And  . hydrochlorothiazide  12.5 mg Oral Daily  . influenza  inactive virus vaccine  0.5 mL Intramuscular Tomorrow-1000  . insulin NPH  15 Units Subcutaneous BID  . isosorbide dinitrate  30 mg Oral q morning - 10a  . metoprolol tartrate  12.5 mg Oral BID  . potassium chloride  40 mEq Oral BID  . simvastatin  20 mg Oral QHS  . sodium chloride  3 mL Intravenous Q12H   Continuous Infusions:  PRN Meds:.sodium chloride, acetaminophen, nitroGLYCERIN, ondansetron (ZOFRAN) IV, sodium chloride Assessment/Plan: Principal Problem:  *Chest pain Active Problems:  DM (diabetes mellitus)  Hypokalemia  Benign hypertension  S/P CABG (coronary artery bypass graft)  CKD (chronic kidney disease)  Obesity  Hyperlipidemia  Await  echocardiogram. Await stress test. Appreciate cardiology assistance.   LOS: 2 days   Leza Apsey L 06/03/2011, 4:10 PM

## 2011-06-03 NOTE — Progress Notes (Signed)
CARE MANAGEMENT NOTE 06/03/2011  Patient:  Erin Jordan, Erin Jordan   Account Number:  [id number]  Date Initiated:  06/03/2011  Documentation initiated by:  Claretha Cooper  Subjective/Objective Assessment:   Pt admitted for chest pain. PTA lived at home with her daughter. No HH or DME currently used. Pt states her PCP is Dr. Wenda Overland in [place].     Action/Plan:   Pt plans to DC home with no HH needs identified.   Anticipated DC Date:  06/04/2011   Anticipated DC Plan:  [place]  CM consult      Choice offered to / List presented to:             Status of service:  In process, will continue to follow Medicare Important Message given?   (If response is "NO", the following Medicare IM given date fields will be blank) Date Medicare IM given:   Date Additional Medicare IM given:    Discharge Disposition:    Per UR Regulation:    Comments:  06/03/11 [place] BSN CM

## 2011-06-03 NOTE — Progress Notes (Signed)
*  PRELIMINARY RESULTS* Echocardiogram 2D Echocardiogram has been performed.  Tera Partridge 06/03/2011, 12:00 PM

## 2011-06-03 NOTE — Consult Note (Signed)
CARDIOLOGY CONSULT NOTE  Patient ID: Erin Jordan MRN: [id number] DOB/AGE: 1945/10/06 66 y.o.  Admit date: 06/01/2011 Referring Physician: PTH Primary PhysicianNo primary provider on file. Primary Cardiologist:(Formerly Dr.[facility]) Amun Stemm (new) Reason for Consultation Chest Pain Principal Problem:  *Chest pain Active Problems:  S/P CABG (coronary artery bypass graft)  CKD (chronic kidney disease)  DM (diabetes mellitus)  Hypokalemia  Obesity  Hyperlipidemia  Benign hypertension  HPI: Erin Jordan is a 66 year old, obese female, patient. We are asked to see for recurrent chest pain. The patient has a history of coronary artery disease with coronary artery bypass grafting in 2006 in [place], [place]. She did follow a Dr. Lossie Faes cardiologist in [place] up until approximately 2009 or 2010. She states she could no longer pay for her medical care, was without insurance and therefore was unable to continue cardiology visits. She has recently moved to [place]. She has a prior history of diabetes, hypertension, arthritis, and hypercholesterolemia.    She states for approximately 5 days prior to admission, she was having episodes of heartburn radiating to her throat intermittently without associated nausea, vomiting, diaphoresis, or shortness of breath. She ignored this, feeling it  was only heartburn. Admission. She had just finished eating breakfast and began to have some more heartburn with associated vertigo and weakness. Her morning medicines, along with her insulin she began throwing up recent pressure in her chest. She therefore called EMS and presented to the emergency room. I will the patient's blood pressure was 159/71 pulse 88 with a SpO2 of 100%. X-ray showed cardiomegaly with pulmonary vascular congestion. BNP 166.2. Enzymes were found to be negative x3. It was noted that her potassium was 3.1. This has been repleted. She showed no acute ST-T wave  changes indicative of ischemia. Nice medical noncompliance. He followed by Dr. Pat Kocher.  Review of systems complete and found to be negative unless listed above   Past Medical History  Diagnosis Date  . MI (myocardial infarction) 2006  . Arthritis   . Diabetes mellitus   . Hypertension   . Renal disorder     Family History  Problem Relation Age of Onset  . Heart attack Mother   . Hypertension Mother     History   Social History  . Marital Status: Married    Spouse Name: N/A    Number of Children: N/A  . Years of Education: N/A   Occupational History  . Not on file.   Social History Main Topics  . Smoking status: Never Smoker   . Smokeless tobacco: Not on file  . Alcohol Use: No  . Drug Use: No  . Sexually Active:    Other Topics Concern  . Not on file   Social History Narrative   Lives in [place] with her daughter and grandchildren.    Past Surgical History  Procedure Date  . Coronary artery bypass graft   . Abdominal hysterectomy      Prescriptions prior to admission  Medication Sig Dispense Refill  . amLODipine (NORVASC) 5 MG tablet Take 5 mg by mouth daily.      Marland Kitchen glipiZIDE (GLUCOTROL) 10 MG tablet Take 10 mg by mouth daily.      . insulin NPH (HUMULIN N,NOVOLIN N) 100 UNIT/ML injection Inject 15 Units into the skin 2 (two) times daily.      . isosorbide dinitrate (ISORDIL) 30 MG tablet Take 30 mg by mouth every morning.      Marland Kitchen lisinopril-hydrochlorothiazide (PRINZIDE,ZESTORETIC) 20-12.5 MG per tablet  Take 1 tablet by mouth daily.      Marland Kitchen lovastatin (MEVACOR) 40 MG tablet Take 40 mg by mouth at bedtime.      . potassium chloride (K-DUR,KLOR-CON) 10 MEQ tablet Take 10 mEq by mouth 2 (two) times daily.        Physical Exam: Blood pressure 152/86, pulse 80, temperature 98.1 F (36.7 C), temperature source Oral, resp. rate 20, height 5\' 2"  (1.575 m), weight 173 lb 11.2 oz (78.79 kg), SpO2 99.00%.  General: Well developed, well nourished, in no acute  distress, obese Head: Eyes PERRLA, No xanthomas.   Normal cephalic and atramatic  Lungs:Diminished bibasilar without wheezes. Heart: HRRR S1 S2,distant heart sounds. Pulses are & equal.            No carotid bruit. No JVD.  No abdominal bruits. No femoral bruits. Abdomen: Bowel sounds are positive, hypotensvie, abdomen soft and non-tender without masses or                  Hernia's noted. Msk:  Back normal, normal gait. Normal strength and tone for age. Extremities: No clubbing, cyanosis or edema.  DP +1Well healed sternotomy site noted. Neuro: Alert and oriented X 3. Psych:  Good affect, responds appropriately   Labs:   Lab Results  Component Value Date   WBC 10.1 06/02/2011   HGB 13.4 06/02/2011   HCT 39.2 06/02/2011   MCV 87.1 06/02/2011   PLT 255 06/02/2011     Lab 06/02/11 0430 06/01/11 1544  NA 140 --  K 3.1* --  CL 105 --  CO2 25 --  BUN 20 --  CREATININE 1.42* --  CALCIUM 9.6 --  PROT -- 7.9  BILITOT -- 0.3  ALKPHOS -- 112  ALT -- 16  AST -- 15  GLUCOSE 103* --   Lab Results  Component Value Date   CKTOTAL 116 06/02/2011   CKMB 2.3 06/02/2011   TROPONINI <0.30 06/02/2011    Lab Results  Component Value Date   CHOL 200 06/02/2011   Lab Results  Component Value Date   HDL 45 06/02/2011   Lab Results  Component Value Date   LDLCALC 137* 06/02/2011   Lab Results  Component Value Date   TRIG 91 06/02/2011   Lab Results  Component Value Date   CHOLHDL 4.4 06/02/2011       Radiology: Dg Chest Port 1 View  06/01/2011  *RADIOLOGY REPORT*  Clinical Data: 66 year old female with chest pain and nausea.  PORTABLE CHEST - 1 VIEW  Comparison: 03/24/2004  Findings: Cardiomegaly and pulmonary vascular congestion identified. CABG changes are present. There is no evidence of focal airspace disease, pulmonary edema, suspicious pulmonary nodule/mass, pleural effusion, or pneumothorax. No acute bony abnormalities are identified.  IMPRESSION: Cardiomegaly with pulmonary  vascular congestion.  Original Report Authenticated By: Lura Em, M.D.   [id number] sinus rhythm Possible Inferior infarct , age undetermined Cannot rule out Anterior infarct , age undetermined T wave abnormality, consider lateral ischemia.  ASSESSMENT AND PLAN:   1. Chest Pain: Patient with known history of coronary artery disease with coronary artery bypass grafting in 2006. We will request records of prior surgery and followup cardiology visits from [place], [place]. Her cardiac markers are negative. She is without recurrence of chest discomfort. Echocardiogram will be completed to evaluate her LV function. We will consider stress Myoview in the a.m. for further diagnostic evaluation. No evidence of fluid overload despite pulmonary vascular congestion noted on chest x-ray.. Current creatinine is  1.49. Appropriate medication to include aspirin, amlodipine, lisinopril, and low-dose beta blocker are on board.  2. Hypertension: Not well controlled on current medications for someone with diabetes. We will increase her amlodipine to 10 mg daily. Echo as stated above for LV function.  3. Hypercholesterolemia: He is currently on a statin with elevated LDL in the 137.  Will increase simvastatin to 40 mg each bedtime.   Phill Myron. Purcell Nails NP [facility] 06/03/2011, 10:13 AM   Cardiology Attending Patient interviewed and examined. Discussed with Jory Sims, NP.  Above note annotated and modified based upon my findings.  Patient described burning mid-substernal chest discomfort extending from the epigastrium to the upper neck with associated nausea and emesis. History is at least as consistent with a GI origin for her symptoms as with myocardial ischemia; however, she recalls her symptoms prior to coronary artery bypass as similar. A negative EKG and negative cardiac markers are somewhat reassuring. We will proceed with a pharmacologic stress nuclear study in the a.m. with plans for  discharge if negative. Empiric therapy for the next month with a PPI may be prudent.  Jacqulyn Ducking, MD 06/03/2011, 7:46 PM

## 2011-06-04 ENCOUNTER — Inpatient Hospital Stay (HOSPITAL_COMMUNITY): Payer: Medicare Other

## 2011-06-04 ENCOUNTER — Encounter (HOSPITAL_COMMUNITY): Payer: Self-pay | Admitting: Internal Medicine

## 2011-06-04 ENCOUNTER — Encounter (HOSPITAL_COMMUNITY): Payer: Self-pay | Admitting: Cardiology

## 2011-06-04 DIAGNOSIS — E785 Hyperlipidemia, unspecified: Secondary | ICD-10-CM

## 2011-06-04 DIAGNOSIS — E876 Hypokalemia: Secondary | ICD-10-CM

## 2011-06-04 DIAGNOSIS — R079 Chest pain, unspecified: Secondary | ICD-10-CM

## 2011-06-04 DIAGNOSIS — Z951 Presence of aortocoronary bypass graft: Secondary | ICD-10-CM

## 2011-06-04 LAB — BASIC METABOLIC PANEL
BUN: 25 mg/dL — ABNORMAL HIGH (ref 6–23)
GFR calc Af Amer: 37 mL/min — ABNORMAL LOW (ref >90)
Glucose, Bld: 89 mg/dL (ref 70–99)
Potassium: 4.3 mEq/L (ref 3.5–5.1)

## 2011-06-04 LAB — GLUCOSE, CAPILLARY
Glucose-Capillary: 93 mg/dL (ref 70–99)
Glucose-Capillary: 77 mg/dL (ref 70–99)

## 2011-06-04 LAB — URINE CULTURE
Colony Count: NO GROWTH
Culture  Setup Time: 201302242119
Culture: NO GROWTH

## 2011-06-04 LAB — HEMOGLOBIN A1C: Hgb A1c MFr Bld: 6.7 % — ABNORMAL HIGH (ref <5.7)

## 2011-06-04 IMAGING — NM NM MYOCAR SINGLE W/SPECT W/WALL MOTION & EF
2 series · 12 of 12 positions shown · non-contrast
Comparison: none

nm myoview pharmacologic stress

Ordering Physician: JONJON
JONJON Physician: [REDACTED]al Data: 65-year-old woman admitted to hospital for chest
pain.
NUCLEAR MEDICINE ADENOSINE STRESS MYOVIEW STUDY WITH SPECT AND LEFT
VENTRIUCLAR EJECTION FRACTION
Radionuclide Data: One-day rest/stress protocol performed with
[DATE] mCi of [WO] Myoview.
Stress Data: Regadenoson infusion resulted in no significant
symptoms.  There was a moderate increase in heart rate and modest
decline in systolic blood pressure with drug administration.  No
arrhythmias noted.
EKG: Normal sinus rhythm; indeterminate axis; low voltage; probable
prior inferior myocardial infarction; lateral T-wave abnormalities.
No significant change following Regadenoson.
Scintigraphic Data: Acquisition notable for moderate to severe
breast attenuation.  Left ventricular size was normal.  On
tomographic images reconstructed in standard planes, there was a
small to moderate sized perfusion deficit of moderate intensity in
the distal inferior wall extending inferolaterally.  By comparison
to the resting portion of the study, there was minimal
reversibility in the distal portion of this defect.  The gated
reconstruction demonstrated septal hypokinesis with preserved
overall left ventricular systolic function and normal systolic
accentuation of activity in all myocardial segments.  Estimated
ejection fraction was 63%.

[Series 1: cs cardiac tc hi dose · 6.41mm/px · 6 of 512 frames shown]
[frame 43/512]
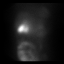
[frame 128/512]
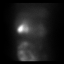
[frame 214/512]
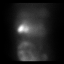
[frame 299/512]
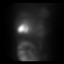
[frame 384/512]
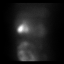
[frame 470/512]
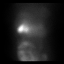

[Series 1: cr cardiac tc low dose · 6.41mm/px · 6 of 64 frames shown]
[frame 6/64]
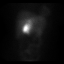
[frame 16/64]
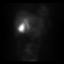
[frame 27/64]
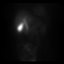
[frame 38/64]
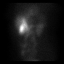
[frame 48/64]
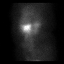
[frame 59/64]
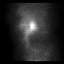

[12 of 12 positions shown; findings below may reference images not displayed]

IMPRESSION: Low risk pharmacologic stress nuclear myocardial study revealing no
stress induced EKG abnormalities, normal left ventricular size and
normal left ventricular systolic function.  By scintigraphic
imaging, there was a predominantly fixed inferolateral and inferior
defect, which could represent breast attenuation artifact or
nontransmural inferior wall scarring. Other findings as noted.

## 2011-06-04 MED ORDER — POTASSIUM CHLORIDE CRYS ER 20 MEQ PO TBCR: 40.0000 meq | EXTENDED_RELEASE_TABLET | Freq: Every day | ORAL | Status: DC

## 2011-06-04 MED ORDER — ATORVASTATIN CALCIUM 40 MG PO TABS: 40.0000 mg | ORAL_TABLET | Freq: Every day | ORAL | Status: DC

## 2011-06-04 MED ORDER — TECHNETIUM TC 99M TETROFOSMIN IV KIT
10.0000 | PACK | Freq: Once | INTRAVENOUS | Status: AC | PRN
  Administered 2011-06-04: 9.5 via INTRAVENOUS

## 2011-06-04 MED ORDER — METOPROLOL SUCCINATE ER 25 MG PO TB24: 25.0000 mg | ORAL_TABLET | Freq: Every day | ORAL | Status: DC

## 2011-06-04 MED ORDER — TECHNETIUM TC 99M TETROFOSMIN IV KIT
30.0000 | PACK | Freq: Once | INTRAVENOUS | Status: AC | PRN
  Administered 2011-06-04: 30 via INTRAVENOUS

## 2011-06-04 MED ORDER — ASPIRIN 81 MG PO TBEC: 81.0000 mg | DELAYED_RELEASE_TABLET | Freq: Every day | ORAL | Status: AC

## 2011-06-04 MED ORDER — SODIUM CHLORIDE 0.9 % IJ SOLN
INTRAMUSCULAR | Status: AC
  Filled 2011-06-04: qty 10

## 2011-06-04 MED ORDER — NITROGLYCERIN 0.4 MG SL SUBL: 0.4000 mg | SUBLINGUAL_TABLET | SUBLINGUAL | Status: DC | PRN

## 2011-06-04 MED ORDER — REGADENOSON 0.4 MG/5ML IV SOLN
INTRAVENOUS | Status: AC
  Administered 2011-06-04: 0.4 mg via INTRAVENOUS
  Filled 2011-06-04: qty 5

## 2011-06-04 NOTE — Discharge Summary (Signed)
Physician Discharge Summary  LABIBA TRYBA MRN: [id number] DOB/AGE: 1945/05/22 66 y.o.  PCP: Celedonio Savage, MD, MD   Admit date: 06/01/2011 Discharge date: 06/04/2011  Discharge Diagnoses:  1. Chest pain. Myocardial infarction ruled out. Myoview stress test: Preliminary finding of no reversible ischemia per cardiologist Dr. Lattie Haw. 2. History of coronary artery disease, status post CABG in the past. 3. Left ventricular hypertrophy, per 2-D echocardiogram, 06/03/2011. 3. Stage III  chronic kidney disease. The patient's creatinine was 1.61 prior to discharge. 4. Hypokalemia. 5. Type 2 diabetes mellitus. The patient's A1c was 6.7. 6. Hypertension. 7. Hyperlipidemia. The patient's fasting lipid profile revealed a total cholesterol of 200, triglycerides of 91, HDL cholesterol 45, and LDL cholesterol 137.   Medication List  As of 06/04/2011  3:23 PM   STOP taking these medications         lovastatin 40 MG tablet         TAKE these medications         amLODipine 5 MG tablet   Commonly known as: NORVASC   Take 5 mg by mouth daily.      aspirin 81 MG EC tablet   Take 1 tablet (81 mg total) by mouth daily.      atorvastatin 40 MG tablet   Commonly known as: LIPITOR   Take 1 tablet (40 mg total) by mouth daily at 6 PM. FOR HIGH CHOLESTEROL.      glipiZIDE 10 MG tablet   Commonly known as: GLUCOTROL   Take 10 mg by mouth daily.      insulin NPH 100 UNIT/ML injection   Commonly known as: HUMULIN N,NOVOLIN N   Inject 15 Units into the skin 2 (two) times daily.      isosorbide dinitrate 30 MG tablet   Commonly known as: ISORDIL   Take 30 mg by mouth every morning.      lisinopril-hydrochlorothiazide 20-12.5 MG per tablet   Commonly known as: PRINZIDE,ZESTORETIC   Take 1 tablet by mouth daily.      metoprolol succinate 25 MG 24 hr tablet   Commonly known as: TOPROL-XL   Take 1 tablet (25 mg total) by mouth daily. For your blood pressure and heart.      nitroGLYCERIN  0.4 MG SL tablet   Commonly known as: NITROSTAT   Place 1 tablet (0.4 mg total) under the tongue every 5 (five) minutes x 3 doses as needed for chest pain.      potassium chloride 10 MEQ tablet   Commonly known as: K-DUR,KLOR-CON   Take 10 mEq by mouth 2 (two) times daily.            Discharge Condition: Improved and stable.  Disposition:  Home.   Consults: Jacqulyn Ducking, M.D.   Significant Diagnostic Studies: Dg Chest Port 1 View  06/01/2011  *RADIOLOGY REPORT*  Clinical Data: 66 year old female with chest pain and nausea.  PORTABLE CHEST - 1 VIEW  Comparison: 03/24/2004  Findings: Cardiomegaly and pulmonary vascular congestion identified. CABG changes are present. There is no evidence of focal airspace disease, pulmonary edema, suspicious pulmonary nodule/mass, pleural effusion, or pneumothorax. No acute bony abnormalities are identified.  IMPRESSION: Cardiomegaly with pulmonary vascular congestion.  Original Report Authenticated By: Lura Em, M.D.   ECHO:Study Conclusions  - Left ventricle: The cavity size was normal.Mild tomoderate LVH with disproportionate septal thickening. Systolic function was normal. The estimated ejection fraction was in the range of 60% to 65%. Wall motion was normal; there were no  regional wall motion abnormalities. - Aortic valve: Mildly calcified annulus. - Mitral valve: Calcified annulus. - Right ventricle: The cavity size was normal. Wall thickness was mildly to moderately increased. - Atrial septum: No defect or patent foramen ovale was identified.      Microbiology: Recent Results (from the past 240 hour(s))  URINE CULTURE     Status: Normal   Collection Time   06/01/11  6:22 PM      Component Value Range Status Comment   Specimen Description URINE, CLEAN CATCH   Final    Special Requests NONE   Final    Culture  Setup Time [id number]   Final    Colony Count NO GROWTH   Final    Culture NO GROWTH   Final    Report Status  06/04/2011 FINAL   Final      Labs: Results for orders placed during the hospital encounter of 06/01/11 (from the past 48 hour(s))  GLUCOSE, CAPILLARY     Status: Normal   Collection Time   06/02/11  4:52 PM      Component Value Range Comment   Glucose-Capillary 77  70 - 99 (mg/dL)   GLUCOSE, CAPILLARY     Status: Normal   Collection Time   06/02/11  9:18 PM      Component Value Range Comment   Glucose-Capillary 96  70 - 99 (mg/dL)   GLUCOSE, CAPILLARY     Status: Normal   Collection Time   06/03/11  7:41 AM      Component Value Range Comment   Glucose-Capillary 92  70 - 99 (mg/dL)   GLUCOSE, CAPILLARY     Status: Abnormal   Collection Time   06/03/11 11:55 AM      Component Value Range Comment   Glucose-Capillary 117 (*) 70 - 99 (mg/dL)   HEMOGLOBIN A1C     Status: Abnormal   Collection Time   06/03/11  8:30 PM      Component Value Range Comment   Hemoglobin A1C 6.7 (*) <5.7 (%)    Mean Plasma Glucose 146 (*) <117 (mg/dL)   GLUCOSE, CAPILLARY     Status: Abnormal   Collection Time   06/03/11  9:17 PM      Component Value Range Comment   Glucose-Capillary 147 (*) 70 - 99 (mg/dL)   GLUCOSE, CAPILLARY     Status: Normal   Collection Time   06/04/11  7:30 AM      Component Value Range Comment   Glucose-Capillary 93  70 - 99 (mg/dL)    Comment 1 Documented in Chart      Comment 2 Notify RN     GLUCOSE, CAPILLARY     Status: Normal   Collection Time   06/04/11 11:16 AM      Component Value Range Comment   Glucose-Capillary 77  70 - 99 (mg/dL)    Comment 1 Documented in Chart      Comment 2 Notify RN     BASIC METABOLIC PANEL     Status: Abnormal   Collection Time   06/04/11 12:32 PM      Component Value Range Comment   Sodium 139  135 - 145 (mEq/L)    Potassium 4.3  3.5 - 5.1 (mEq/L)    Chloride 102  96 - 112 (mEq/L)    CO2 27  19 - 32 (mEq/L)    Glucose, Bld 89  70 - 99 (mg/dL)    BUN 25 (*) 6 - 23 (  mg/dL)    Creatinine, Ser 1.61 (*) 0.50 - 1.10 (mg/dL)    Calcium 10.2   8.4 - 10.5 (mg/dL)    GFR calc non Af Amer 32 (*) >90 (mL/min)    GFR calc Af Amer 37 (*) >90 (mL/min)      HPI : The patient is a 66 year old woman with a past medical history significant for coronary artery disease with coronary artery bypass grafting in 2006 and [place], [place]. She presented to the emergency department on 06/01/2011 with a chief complaint of chest pain. In the emergency department, she was noted to be mildly hypertensive, afebrile, and otherwise hemodynamically stable. Her lab data were significant for an Coso 156, creatinine of 1.49, and troponin I of less than 0.30. Her chest x-ray revealed cardiomegaly with pulmonary vascular congestion. Her EKG revealed normal sinus rhythm, possible inferior infarct, and nonspecific T-wave abnormalities. She was admitted for further evaluation and management.  HOSPITAL COURSE: The patient was restarted on her antihypertensive medications and all of her other chronic medications. Full dose Lovenox was started empirically. Nitroglycerin paste was added for possible angina. Aspirin was started as well. For further evaluation, a number of studies were ordered. All of her cardiac enzymes were within normal limits and therefore she ruled out for myocardial infarction. Her 2-D echocardiogram revealed preserved LV function and no regional wall motion abnormalities. Her hemoglobin A1c was 6.7. The results of her fasting lipid panel were dictated above. Based on the findings, lovastatin was discontinued in favor of Lipitor. Because her blood pressure was relatively uncontrolled, metoprolol was added to Norvasc and lisinopril/HCTZ. Next  Cardiologist, Dr. Lattie Haw was consulted. He agreed with medical management. He recommended further evaluation with a Myoview stress test. The test was performed today. He called me personally to inform me that the patient's stress test was relatively negative.  The patient had no chest pain during the hospitalization.  She remained hemodynamically stable and afebrile. Her blood pressure did improve. Her capillary blood glucose/venous glucose was well controlled. Her renal function decreased a little in the setting of chronic kidney disease. Her renal function will need to be followed in the outpatient setting. She was chest pain-free and had no complaints of shortness of breath at the time of discharge.  She was informed of the change in statin therapy. She was advised to take aspirin daily. She was also informed of the addition of metoprolol XL.  Discharge Exam: Blood pressure 140/76, pulse 79, temperature 98 F (36.7 C), temperature source Oral, resp. rate 20, height 5\' 2"  (1.575 m), weight 78.79 kg (173 lb 11.2 oz), SpO2 92.00%.  Lungs: Clear to auscultation bilaterally. Heart: S1, S2, with a soft systolic murmur. Abdomen: Positive bowel sounds, soft, nontender, nondistended. Extremities: No pedal edema.   Discharge Orders    Future Appointments: Provider: Department: Dept Phone: Center:   06/28/2011 1:15 PM Cristopher Estimable. Lattie Haw, MD [facility] [phone] [id number]     Future Orders Please Complete By Expires   Diet - low sodium heart healthy      Diet Carb Modified      Increase activity slowly      Discharge instructions      Comments:   TAKE MEDICATIONS AS PRESCRIBED.      Follow-up Information    Follow up with Jacqulyn Ducking, MD on 06/28/2011. (AT 1:15 PM. CARDIOLOGIST.)    Contact information:   [id number] [place] [phone]       Follow up with Celedonio Savage, MD. (  FOLLOW UP AS SCHEDULED.)    Contact information:   [place] [phone]          Total discharge time: 35 minutes.    Signed: Kimyata Milich 06/04/2011, 3:23 PM

## 2011-06-04 NOTE — Progress Notes (Signed)
Stress Lab Nurses Notes - [facility]  TORRENCE HOLCK 06/04/2011  Reason for doing test: Chest Pain  Type of test: Leane Call  Nurse performing test: Carvel Getting, RN  Nuclear Medicine Tech: Melburn Hake  Echo Tech: Not Applicable  MD performing test: R. Rothbart  Family MD:   Test explained and consent signed: yes  IV started: 22g jelco, Saline lock flushed, No redness or edema and Saline lock from floor  Symptoms: just a funny feeling all over  Treatment/Intervention: None  Reason test stopped: protocol completed  After recovery IV was: No redness or edema and Saline Lock flushed  Patient to return to [facility]. Med at :11:45  Patient discharged: Transported back to room 317 via [place]  Patient's Condition upon discharge was: stable  Comments: Patient had a [employer]. Her resting HR was82 and resting Bp is 122.70, Her peak HR was 122 and her peak BP was 122/70.To return to nuc med around 11:15.  Norlene Duel

## 2011-06-04 NOTE — Plan of Care (Signed)
Problem: Phase I Progression Outcomes Goal: Anginal pain relieved Outcome: Completed/Met Date Met:  06/04/11 Denies pain at this time.

## 2011-06-04 NOTE — Consult Note (Signed)
Erin Jordan  66 y.o.  female  Subjective: Denies chest discomfort or dyspnea overnight.  Slight irritation in throat with cough.  Allergy: Review of patient's allergies indicates no known allergies.  Objective: Vital signs in last 24 hours: Temp:  [97.6 F (36.4 C)-98.7 F (37.1 C)] 97.6 F (36.4 C) (02/26 0511) Pulse Rate:  [76-83] 76  (02/26 0511) Resp:  [20] 20  (02/26 0511) BP: (125-131)/(69-85) 131/85 mmHg (02/26 0511) SpO2:  [97 %-100 %] 99 % (02/26 0700)  78.79 kg (173 lb 11.2 oz) Body mass index is 31.77 kg/(m^2).  Weight change:  Last BM Date: 06/01/11  Intake/Output from previous day: 02/25 0701 - 02/26 0700 In: 633 [P.O.:630; I.V.:3] Out: 1350 [Urine:1350]  General- Well developed; no acute distress; overweight Neck- No JVD, no carotid bruits Lungs- clear lung fields; normal I:E ratio Cardiovascular- normal PMI; normal S1 and S2; minimal systolic ejection murmur Abdomen- normal bowel sounds; soft and non-tender without masses or organomegaly Skin- Warm, no significant lesions Extremities- Nl distal pulses; no edema  Lab Results: Cardiac Markers:   Basename 06/02/11 0430 06/01/11 2313  TROPONINI <0.30 <0.30   CBC:   Basename 06/02/11 0430 06/01/11 1544  WBC 10.1 9.6  HGB 13.4 14.2  HCT 39.2 42.1  PLT 255 245   BMET:  Basename 06/02/11 0430 06/01/11 1544  NA 140 140  K 3.1* 3.6  CL 105 102  CO2 25 28  GLUCOSE 103* 156*  BUN 20 21  CREATININE 1.42* 1.49*  CALCIUM 9.6 10.0   Hepatic Function:   Basename 06/01/11 1544  PROT 7.9  ALBUMIN 4.1  AST 15  ALT 16  ALKPHOS 112  BILITOT 0.3  BILIDIR --  IBILI --   GFR:  Estimated Creatinine Clearance: 37.4 ml/min (by C-G formula based on Cr of 1.42). Lipids:   Basename 06/02/11 0431  CHOL 200  TRIG 91  HDL 45  Imaging: Imaging results have been reviewed  Medications: I have reviewed the patient's current medications.  Principal Problem:  *Chest pain Active Problems:      Benign  hypertension   Assessment/Plan:  CKD (chronic kidney disease): Stable and mildly impaired renal function.  Mild hypokalemia-not reassessed.  Will replace K+ deficit and instruct in maintaining adequate dietary potassium intake.   Hyperlipidemia: Inadequate control with current Rx.  Non-HDL cholesterol is 155.  Changed to a more potent statin at increased dose.  Recheck in 1 month.  S/P CABG:  No further sxs.  Will proceed with stress nuclear study.   LOS: 3 days   Jacqulyn Ducking 06/04/2011, 10:36 AM

## 2011-06-04 NOTE — Progress Notes (Signed)
UR Chart Review Completed  

## 2011-06-25 ENCOUNTER — Encounter: Payer: Self-pay | Admitting: Adult Health

## 2011-06-28 ENCOUNTER — Encounter: Payer: Medicare Other | Admitting: Cardiology

## 2011-07-10 ENCOUNTER — Encounter: Payer: Medicare Other | Admitting: Cardiology

## 2011-07-11 ENCOUNTER — Ambulatory Visit (INDEPENDENT_AMBULATORY_CARE_PROVIDER_SITE_OTHER): Payer: Medicare Other | Admitting: Cardiology

## 2011-07-11 ENCOUNTER — Encounter: Payer: Self-pay | Admitting: Cardiology

## 2011-07-11 VITALS — BP 146/88 | HR 77 | Ht 61.0 in | Wt 171.0 lb

## 2011-07-11 DIAGNOSIS — I10 Essential (primary) hypertension: Secondary | ICD-10-CM | POA: Insufficient documentation

## 2011-07-11 DIAGNOSIS — E785 Hyperlipidemia, unspecified: Secondary | ICD-10-CM

## 2011-07-11 DIAGNOSIS — R7303 Prediabetes: Secondary | ICD-10-CM | POA: Insufficient documentation

## 2011-07-11 DIAGNOSIS — E669 Obesity, unspecified: Secondary | ICD-10-CM

## 2011-07-11 DIAGNOSIS — Z8639 Personal history of other endocrine, nutritional and metabolic disease: Secondary | ICD-10-CM | POA: Insufficient documentation

## 2011-07-11 DIAGNOSIS — I2510 Atherosclerotic heart disease of native coronary artery without angina pectoris: Secondary | ICD-10-CM

## 2011-07-11 DIAGNOSIS — Z951 Presence of aortocoronary bypass graft: Secondary | ICD-10-CM | POA: Insufficient documentation

## 2011-07-11 DIAGNOSIS — N183 Chronic kidney disease, stage 3 unspecified: Secondary | ICD-10-CM | POA: Insufficient documentation

## 2011-07-11 DIAGNOSIS — E119 Type 2 diabetes mellitus without complications: Secondary | ICD-10-CM

## 2011-07-11 MED ORDER — METOPROLOL SUCCINATE ER 50 MG PO TB24: 50.0000 mg | ORAL_TABLET | Freq: Every day | ORAL | Status: DC

## 2011-07-11 MED ORDER — AMLODIPINE BESYLATE 10 MG PO TABS: 10.0000 mg | ORAL_TABLET | Freq: Every day | ORAL | Status: DC

## 2011-07-11 NOTE — Progress Notes (Signed)
Patient ID: Erin Jordan, female   DOB: [birth year], 66 y.o.   MRN: [id number]  HPI: Scheduled return visit for this nice woman with previous CABG surgery recently admitted to hospital with chest discomfort.  Myocardial infarction was ruled out.  A pharmacologic stress nuclear study revealed a predominantly persistent, small and mild inferior and inferolateral defect consistent with somewhat variable breast attenuation or modest inferior scarring with minimal superimposed ischemia.  This was considered a low risk study, and continued medical therapy was advised.  Since discharge, patient has had one episode of chest discomfort that she thought was likely to reflect gastroesophageal reflux disease.  She is not chronically treated with a PPI or H2 blocker.  Prior to Admission medications   Medication Sig Start Date End Date Taking? Authorizing Provider  aspirin EC 81 MG EC tablet Take 1 tablet (81 mg total) by mouth daily. 06/04/11 06/03/12 Yes Rexene Alberts, MD  atorvastatin (LIPITOR) 40 MG tablet Take 1 tablet (40 mg total) by mouth daily at 6 PM. FOR HIGH CHOLESTEROL. 06/04/11 06/03/12 Yes Rexene Alberts, MD  glipiZIDE (GLUCOTROL) 10 MG tablet Take 10 mg by mouth daily.   Yes Historical Provider, MD  insulin NPH (HUMULIN N,NOVOLIN N) 100 UNIT/ML injection Inject 15 Units into the skin 2 (two) times daily.   Yes Historical Provider, MD  isosorbide dinitrate (ISORDIL) 30 MG tablet Take 30 mg by mouth every morning.   Yes Historical Provider, MD  lisinopril-hydrochlorothiazide (PRINZIDE,ZESTORETIC) 20-12.5 MG per tablet Take 1 tablet by mouth daily.   Yes Historical Provider, MD  nitroGLYCERIN (NITROSTAT) 0.4 MG SL tablet Place 1 tablet (0.4 mg total) under the tongue every 5 (five) minutes x 3 doses as needed for chest pain. 06/04/11 06/03/12 Yes Rexene Alberts, MD  potassium chloride (K-DUR,KLOR-CON) 10 MEQ tablet Take 10 mEq by mouth 2 (two) times daily.   Yes Historical Provider, MD  amLODipine (NORVASC) 10  MG tablet Take 1 tablet (10 mg total) by mouth daily. 07/11/11 07/10/12  Yehuda Savannah, MD  metoprolol succinate (TOPROL-XL) 50 MG 24 hr tablet Take 1 tablet (50 mg total) by mouth daily. Take with or immediately following a meal. 07/11/11 07/10/12  Yehuda Savannah, MD   No Known Allergies    Past medical history, social history, and family history reviewed and updated.  ROS: Denies orthopnea, PND, exertional dyspnea, pedal edema, palpitations, lightheadedness or syncope.  All other systems reviewed and are negative.  PHYSICAL EXAM: BP 146/88  Pulse 77  Ht 5\' 1"  (1.549 m)  Wt 77.565 kg (171 lb)  BMI 32.31 kg/m2  General-Well developed; no acute distress Body habitus-Moderately overweight Neck-No JVD; no carotid bruits Lungs-clear lung fields; resonant to percussion Cardiovascular-normal PMI; normal S1 and S2; modest systolic ejection murmur Abdomen-normal bowel sounds; soft and non-tender without masses or organomegaly Musculoskeletal-No deformities, no cyanosis or clubbing Neurologic-Normal cranial nerves; symmetric strength and tone Skin-Warm, no significant lesions Extremities-distal pulses intact; no edema  ASSESSMENT AND PLAN:  Jacqulyn Ducking, MD 07/11/2011 12:30 PM

## 2011-07-11 NOTE — Assessment & Plan Note (Signed)
Mild to moderate impairment in renal function, which will be reassessed in a few months.

## 2011-07-11 NOTE — Patient Instructions (Signed)
Your physician recommends that you schedule a follow-up appointment in: 6 months  Your physician has recommended you make the following change in your medication:  1 - INCREASE Amlodipine to 10 mg daily 2 - INCREASE Toprol to 50 mg daily 3 - May use mylanta or maalox at home for chest discomfort

## 2011-07-11 NOTE — Assessment & Plan Note (Signed)
Control of hypertension has been suboptimal, especially in the setting of diabetes.  Dose of amlodipine and metoprolol will be increased.

## 2011-07-11 NOTE — Assessment & Plan Note (Addendum)
Patient is now being treated with moderate dose atorvastatin.  Lipid profile will be repeated.

## 2011-07-11 NOTE — Assessment & Plan Note (Signed)
Weight loss encouraged 

## 2011-07-11 NOTE — Assessment & Plan Note (Signed)
Low risk stress nuclear study-continue medical therapy.

## 2011-09-05 ENCOUNTER — Other Ambulatory Visit: Payer: Self-pay | Admitting: *Deleted

## 2011-09-05 DIAGNOSIS — E785 Hyperlipidemia, unspecified: Secondary | ICD-10-CM

## 2011-09-05 DIAGNOSIS — I10 Essential (primary) hypertension: Secondary | ICD-10-CM

## 2011-09-20 ENCOUNTER — Encounter: Payer: Self-pay | Admitting: *Deleted

## 2011-11-04 ENCOUNTER — Encounter: Payer: Self-pay | Admitting: Vascular Surgery

## 2011-11-05 ENCOUNTER — Ambulatory Visit (INDEPENDENT_AMBULATORY_CARE_PROVIDER_SITE_OTHER): Payer: Medicaid Other | Admitting: Vascular Surgery

## 2011-11-05 ENCOUNTER — Encounter: Payer: Self-pay | Admitting: Vascular Surgery

## 2011-11-05 VITALS — BP 145/73 | HR 86 | Resp 18 | Ht 63.0 in | Wt 166.1 lb

## 2011-11-05 DIAGNOSIS — M79609 Pain in unspecified limb: Secondary | ICD-10-CM | POA: Insufficient documentation

## 2011-11-05 NOTE — Progress Notes (Signed)
[facility]   Vascular Consult Note    Patient name: Erin Jordan MRN: [id number] DOB: 1945/07/18 Sex: female   Referred by: Wenda Overland  Reason for referral: Bilateral lower extremity claudication  HISTORY OF PRESENT ILLNESS: Patient is seen today for discussion of bilateral lower extremity claudication. She has a active 66 year old female who reports pain in both calves with walking. She does not have any resting symptoms. She does have no history of tissue loss on her lower extremities. She reports that she has moderate to severe tiredness aching and cramping in her calves with walking and this is relieved with rest. She does not have any history of prior lower surety intervention. She does have a history of coronary artery disease status post coronary bypass grafting in [place] in 2006  Past Medical History  Diagnosis Date  . Arteriosclerotic cardiovascular disease (ASCVD) 2006    CABG in 2006; Echo in 05/2011-LVH1-2, normal EF  . Arthritis   . Diabetes mellitus, type 2     A1c of 6.7 in 2013  . Hypertension   . Hyperlipidemia   . Chronic kidney disease, stage 3, mod decreased GFR     Creatinine of 1.6 in 06/2011    Past Surgical History  Procedure Date  . Coronary artery bypass graft   . Abdominal hysterectomy     History   Social History  . Marital Status: Married    Spouse Name: N/A    Number of Children: N/A  . Years of Education: N/A   Occupational History  . Not on file.   Social History Main Topics  . Smoking status: Never Smoker   . Smokeless tobacco: Not on file  . Alcohol Use: No  . Drug Use: No  . Sexually Active: Not on file   Other Topics Concern  . Not on file   Social History Narrative   Lives in [place] with her daughter and grandchildren.    Family History  Problem Relation Age of Onset  . Heart attack Mother   . Hypertension Mother     No Known Allergies  Prior to Admission  medications   Medication Sig Start Date End Date Taking? Authorizing Provider  amLODipine (NORVASC) 10 MG tablet Take 1 tablet (10 mg total) by mouth daily. 07/11/11 07/10/12 Yes Yehuda Savannah, MD  aspirin EC 81 MG EC tablet Take 1 tablet (81 mg total) by mouth daily. 06/04/11 06/03/12 Yes Rexene Alberts, MD  atorvastatin (LIPITOR) 40 MG tablet Take 1 tablet (40 mg total) by mouth daily at 6 PM. FOR HIGH CHOLESTEROL. 06/04/11 06/03/12 Yes Rexene Alberts, MD  glipiZIDE (GLUCOTROL) 10 MG tablet Take 10 mg by mouth daily.   Yes Historical Provider, MD  insulin NPH (HUMULIN N,NOVOLIN N) 100 UNIT/ML injection Inject 15 Units into the skin 2 (two) times daily.   Yes Historical Provider, MD  isosorbide dinitrate (ISORDIL) 30 MG tablet Take 30 mg by mouth every morning.   Yes Historical Provider, MD  lisinopril-hydrochlorothiazide (PRINZIDE,ZESTORETIC) 20-12.5 MG per tablet Take 1 tablet by mouth daily.   Yes Historical Provider, MD  metoprolol succinate (TOPROL-XL) 50 MG 24 hr tablet Take 1 tablet (50 mg total) by mouth daily. Take with or immediately following a meal. 07/11/11 07/10/12 Yes Yehuda Savannah, MD  nitroGLYCERIN (NITROSTAT) 0.4 MG SL tablet Place 1 tablet (0.4 mg total) under the tongue every 5 (five) minutes x 3 doses as needed for chest pain. 06/04/11 06/03/12  Yes Rexene Alberts, MD  potassium chloride (K-DUR,KLOR-CON) 10 MEQ tablet Take 10 mEq by mouth 2 (two) times daily.   Yes Historical Provider, MD     REVIEW OF SYSTEMS: Cardiovascular: No chest pain, chest pressure, palpitations, orthopnea, or dyspnea on exertion.   No history of DVT or phlebitis. Pulmonary: No productive cough, asthma or wheezing. Neurologic: No weakness, paresthesias, aphasia, or amaurosis. No dizziness. Hematologic: No bleeding problems or clotting disorders. Musculoskeletal: No joint pain or joint swelling. Gastrointestinal: No blood in stool or hematemesis Genitourinary: No dysuria or hematuria. Psychiatric:: No  history of major depression. Integumentary: No rashes or ulcers. Constitutional: No fever or chills.  PHYSICAL EXAMINATION:  Filed Vitals:   11/05/11 1447  BP: 145/73  Pulse: 86  Resp: 18    General: The patient appears their stated age. Pulmonary: There is a good air exchange bilaterally without wheezing or rales. Abdomen: Soft and non-tender with normal pitch bowel sounds. Moderate obesity Musculoskeletal: There are no major deformities.  There is no significant extremity pain. Neurologic: No focal weakness or paresthesias are detected, Skin: There are no ulcer or rashes noted. Psychiatric: The patient has normal affect. Cardiovascular: There is a regular rate and rhythm without significant murmur appreciated. Pulse status 2+ radial and femoral pulses. She does have palpable 1+ popliteal and dorsalis pedis pulses bilaterally   Outside Studies/Documentation Historical records were reviewed.  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 I explained that this very well may be amenable to percutaneous treatment. I explained that she would have to have arteriography and further evaluation for this. I did explain that this was not limb threatening it certainly would be safe to continue her walking program and observation. She was relieved with this discussion and does wish observation only and does not wish intervention at this time. She does have a history of baseline renal insufficiency and therefore I would require admission for IV hydration if she wished further evaluation. She was comfortable with this discussion will notify should she wish to have arteriography for further evaluation  Deante Blough 7/30/20134:48 PM

## 2011-11-20 ENCOUNTER — Other Ambulatory Visit (HOSPITAL_COMMUNITY): Payer: Self-pay | Admitting: Nephrology

## 2011-11-20 DIAGNOSIS — N289 Disorder of kidney and ureter, unspecified: Secondary | ICD-10-CM

## 2011-12-25 ENCOUNTER — Ambulatory Visit (HOSPITAL_COMMUNITY): Payer: PRIVATE HEALTH INSURANCE | Attending: Nephrology

## 2012-01-24 ENCOUNTER — Ambulatory Visit (HOSPITAL_COMMUNITY): Payer: PRIVATE HEALTH INSURANCE

## 2012-01-27 ENCOUNTER — Other Ambulatory Visit (HOSPITAL_COMMUNITY): Payer: Self-pay | Admitting: Nephrology

## 2012-01-27 ENCOUNTER — Ambulatory Visit (HOSPITAL_COMMUNITY)
Admission: RE | Admit: 2012-01-27 | Discharge: 2012-01-27 | Disposition: A | Discharge status: Home / Self Care | Payer: PRIVATE HEALTH INSURANCE | Source: Ambulatory Visit | Attending: Nephrology | Admitting: Nephrology

## 2012-01-27 DIAGNOSIS — N289 Disorder of kidney and ureter, unspecified: Secondary | ICD-10-CM

## 2012-01-27 IMAGING — US US RENAL
1 series · 13 of 25 positions shown · non-contrast
Comparison: Renal ultrasound [DATE]

CLINICAL DATA: Renal insufficiency

RENAL/URINARY TRACT ULTRASOUND COMPLETE

[Series 1: us renal · 0.21mm/px · 13 of 46 slices shown]
[im 1/46]
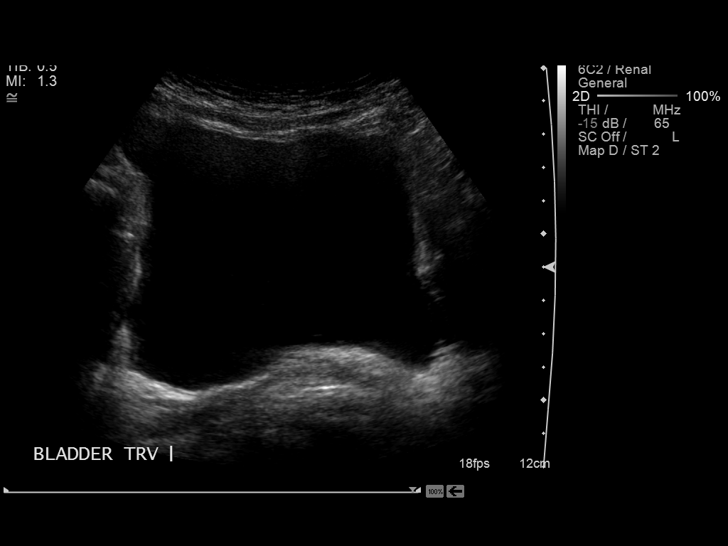
[im 4/46]
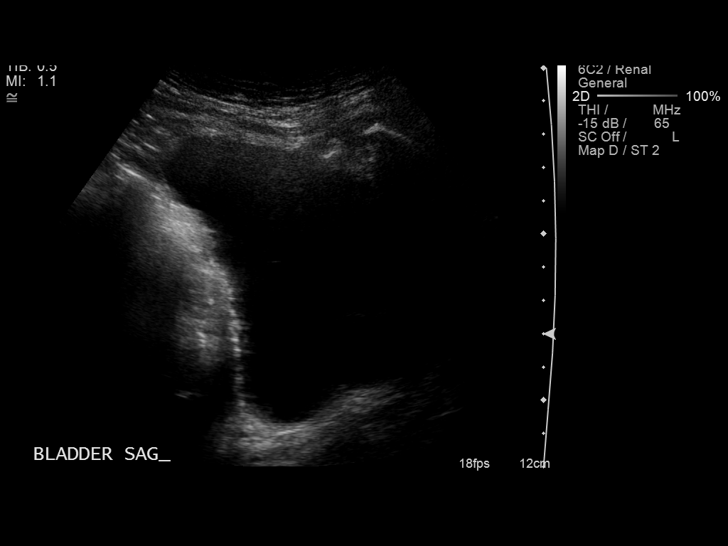
[im 8/46]
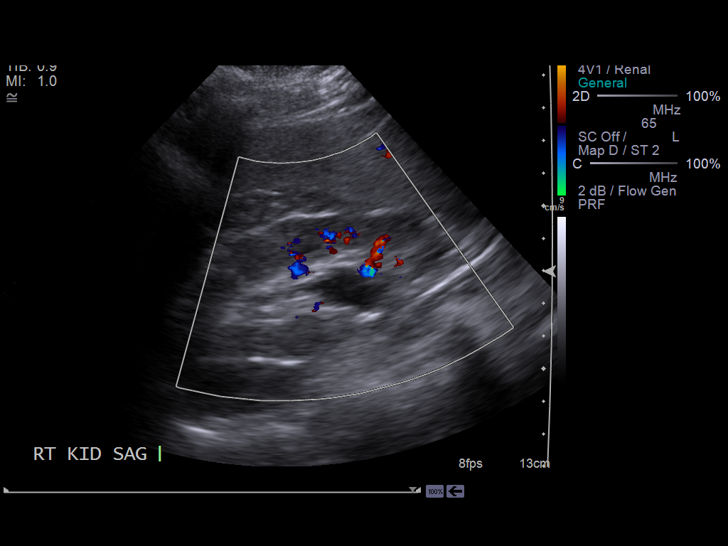
[im 12/46]
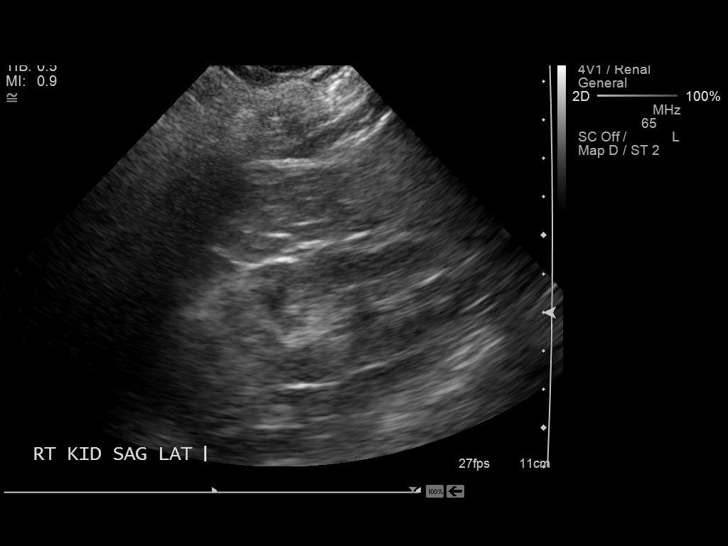
[im 16/46]
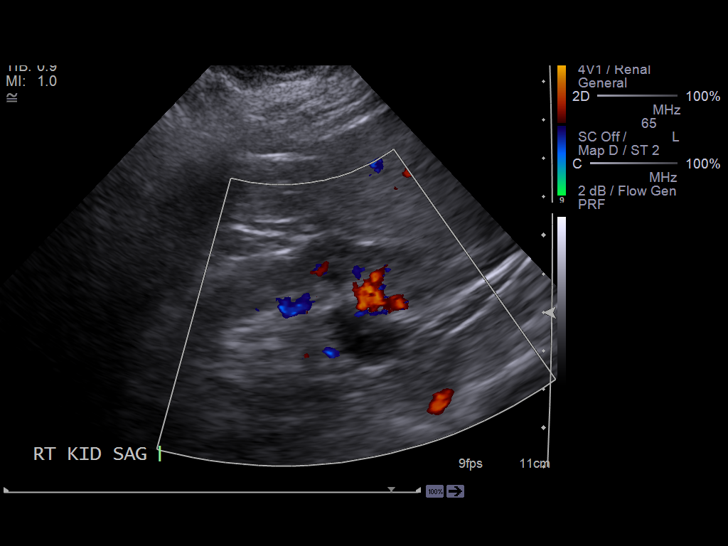
[im 19/46]
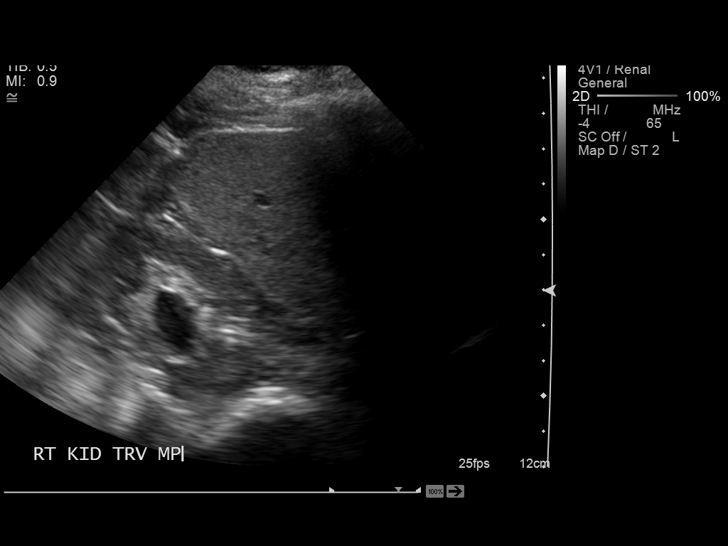
[im 23/46]
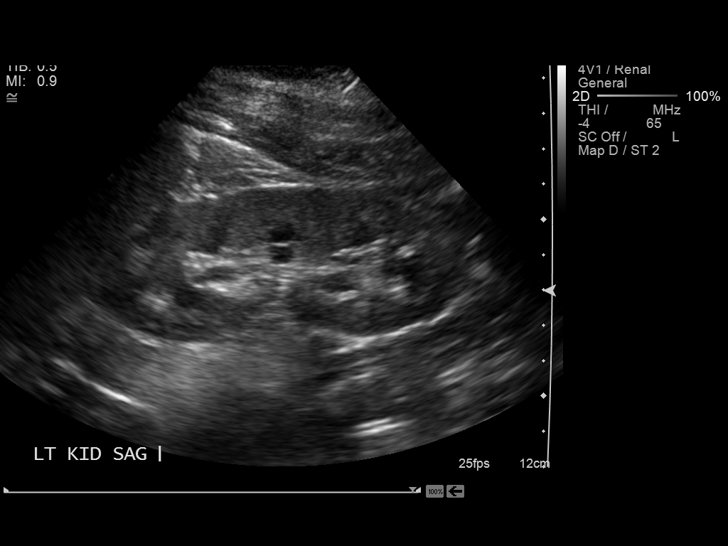
[im 27/46]
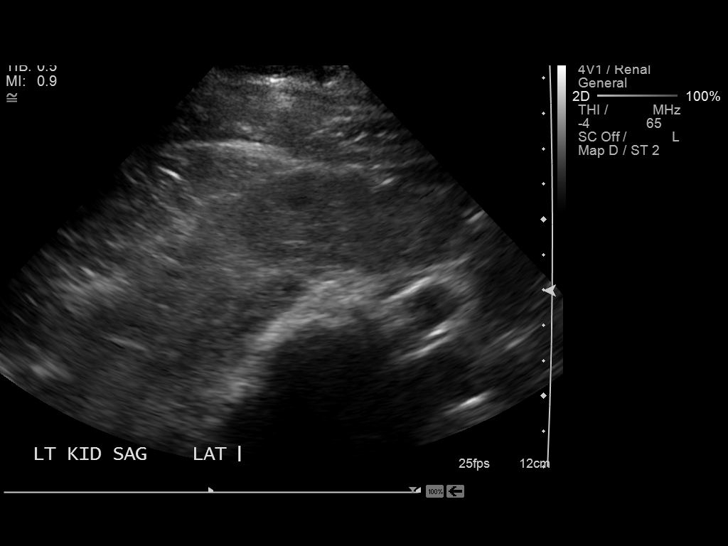
[im 31/46]
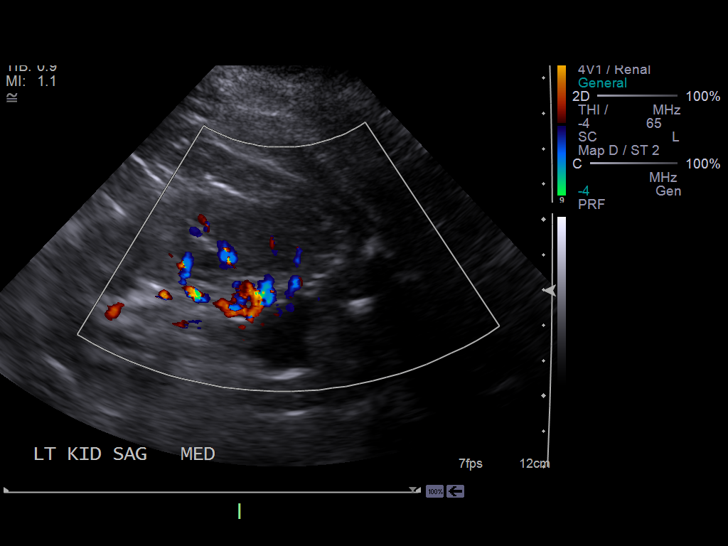
[im 34/46]
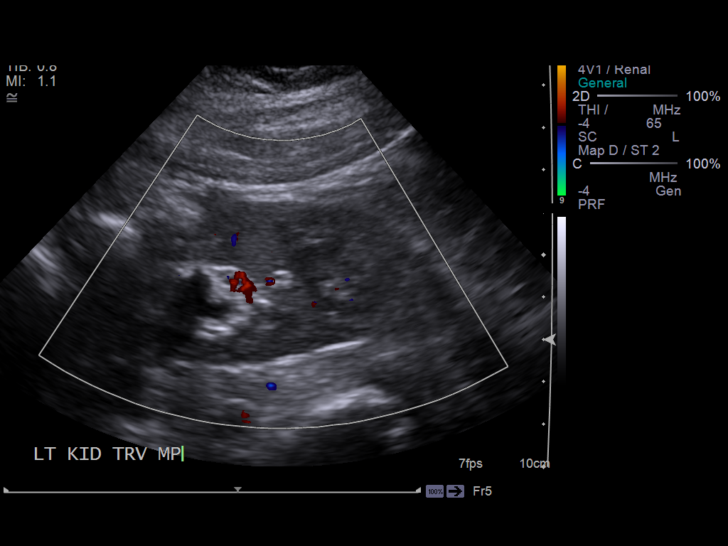
[im 38/46]
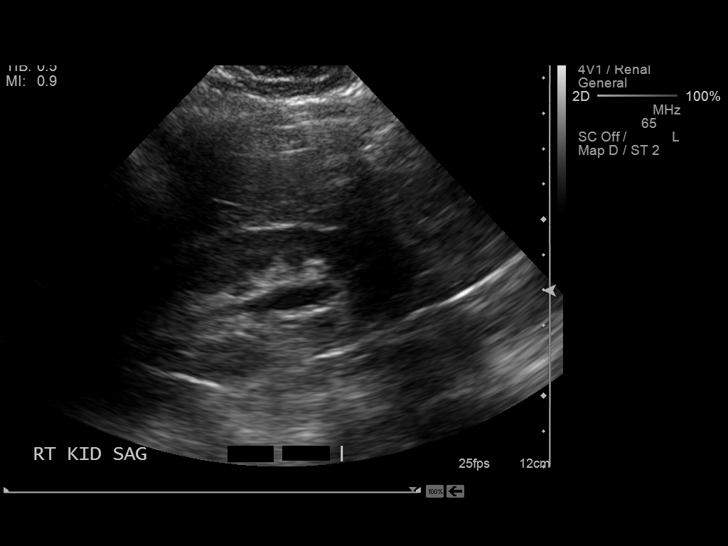
[im 42/46]
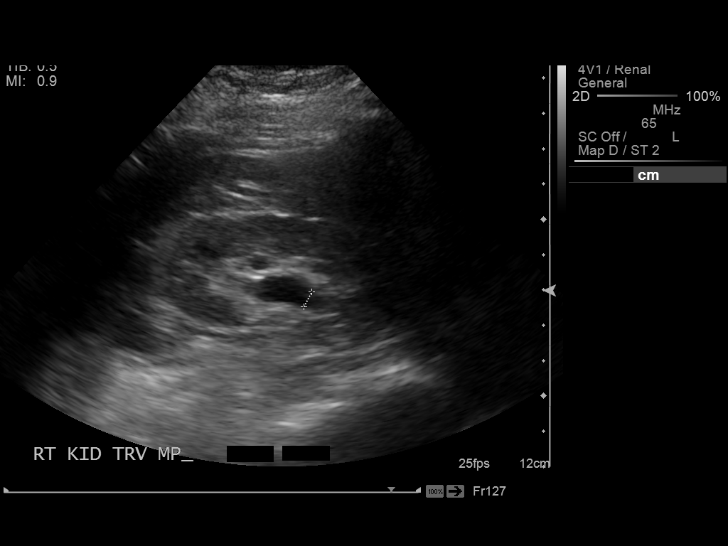
[im 46/46]
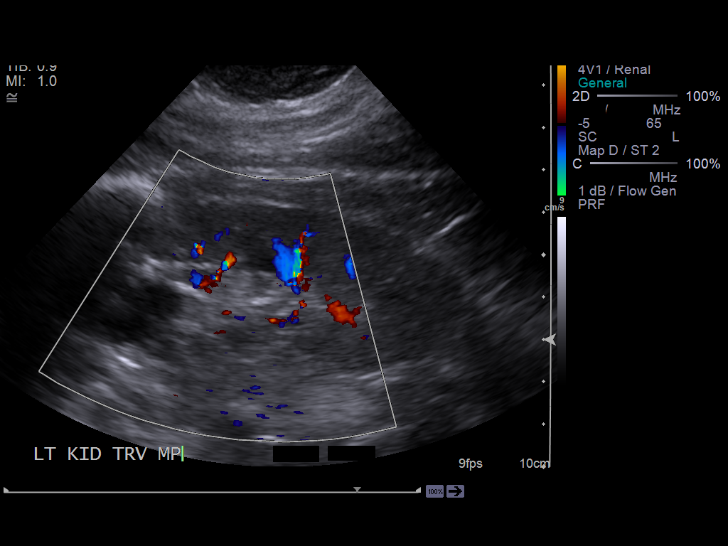

[13 of 25 positions shown; findings below may reference images not displayed]

FINDINGS: Right Kidney:  Mild cortical thinning.  Cortical echogenicity is
slightly increased, suggesting underlying medical renal disease.
Mild fullness of the right renal collecting system, without frank
hydronephrosis.  8.3 cm in length.  No focal cystic or solid renal
lesions.

Left Kidney:  Normal cortical echogenicity.  10.8 cm in length.
The no focal cystic or solid renal lesions.  Mild fullness of the
renal collecting system, without frank hydronephrosis.

Bladder:  Well distended without focal wall abnormalities.  A right
ureteral jet was identified.  No definite left ureteral jet was
noted during the period of sonographic observation.
IMPRESSION: 1.  Mild cortical thinning and increased echogenicity in the right
kidney, suggesting underlying medical renal disease.
2.  Mild fullness in the collecting systems of the kidneys
bilaterally, without frank hydronephrosis.
3.  No left ureteral jet was noted during the period of sonographic
observation.  However, this is nonspecific.  No left hydronephrosis
was noted at this time to strongly suggest the presence of ureteral
obstruction.

## 2012-01-29 ENCOUNTER — Other Ambulatory Visit (HOSPITAL_COMMUNITY): Payer: PRIVATE HEALTH INSURANCE

## 2012-02-18 ENCOUNTER — Encounter (HOSPITAL_COMMUNITY): Payer: Self-pay | Admitting: Oncology

## 2012-02-18 ENCOUNTER — Ambulatory Visit (HOSPITAL_COMMUNITY): Payer: PRIVATE HEALTH INSURANCE | Admitting: Oncology

## 2012-04-14 ENCOUNTER — Encounter (HOSPITAL_COMMUNITY): Payer: PRIVATE HEALTH INSURANCE | Attending: Oncology | Admitting: Oncology

## 2012-04-14 ENCOUNTER — Encounter (HOSPITAL_COMMUNITY): Payer: Self-pay | Admitting: Oncology

## 2012-04-14 VITALS — BP 128/72 | HR 94 | Temp 98.6°F | Resp 16 | Ht 61.75 in | Wt 160.2 lb

## 2012-04-14 DIAGNOSIS — R809 Proteinuria, unspecified: Secondary | ICD-10-CM

## 2012-04-14 DIAGNOSIS — N183 Chronic kidney disease, stage 3 unspecified: Secondary | ICD-10-CM | POA: Insufficient documentation

## 2012-04-14 DIAGNOSIS — I129 Hypertensive chronic kidney disease with stage 1 through stage 4 chronic kidney disease, or unspecified chronic kidney disease: Secondary | ICD-10-CM | POA: Insufficient documentation

## 2012-04-14 DIAGNOSIS — R894 Abnormal immunological findings in specimens from other organs, systems and tissues: Secondary | ICD-10-CM

## 2012-04-14 DIAGNOSIS — I509 Heart failure, unspecified: Secondary | ICD-10-CM | POA: Insufficient documentation

## 2012-04-14 DIAGNOSIS — C9000 Multiple myeloma not having achieved remission: Secondary | ICD-10-CM | POA: Insufficient documentation

## 2012-04-14 DIAGNOSIS — I2510 Atherosclerotic heart disease of native coronary artery without angina pectoris: Secondary | ICD-10-CM | POA: Insufficient documentation

## 2012-04-14 DIAGNOSIS — E119 Type 2 diabetes mellitus without complications: Secondary | ICD-10-CM | POA: Insufficient documentation

## 2012-04-14 DIAGNOSIS — R803 Bence Jones proteinuria: Secondary | ICD-10-CM

## 2012-04-14 HISTORY — DX: Bence Jones proteinuria: R80.3

## 2012-04-14 LAB — CBC WITH DIFFERENTIAL/PLATELET
Eosinophils Absolute: 0.3 10*3/uL (ref 0.0–0.7)
Eosinophils Relative: 5 % (ref 0–5)
HCT: 41.1 % (ref 36.0–46.0)
Hemoglobin: 14 g/dL (ref 12.0–15.0)
Lymphs Abs: 1.7 10*3/uL (ref 0.7–4.0)
MCH: 29.9 pg (ref 26.0–34.0)
MCV: 87.6 fL (ref 78.0–100.0)
Monocytes Absolute: 0.5 10*3/uL (ref 0.1–1.0)
Monocytes Relative: 7 % (ref 3–12)
RBC: 4.69 MIL/uL (ref 3.87–5.11)

## 2012-04-14 LAB — COMPREHENSIVE METABOLIC PANEL
BUN: 20 mg/dL (ref 6–23)
CO2: 28 mEq/L (ref 19–32)
Calcium: 10.3 mg/dL (ref 8.4–10.5)
GFR calc Af Amer: 39 mL/min — ABNORMAL LOW (ref >90)
GFR calc non Af Amer: 34 mL/min — ABNORMAL LOW (ref >90)
Glucose, Bld: 112 mg/dL — ABNORMAL HIGH (ref 70–99)
Total Protein: 8.2 g/dL (ref 6.0–8.3)

## 2012-04-14 LAB — LACTATE DEHYDROGENASE: LDH: 236 U/L (ref 94–250)

## 2012-04-14 NOTE — Progress Notes (Signed)
[facility] NEW PATIENT EVALUATION   Name: Erin Jordan Date: 04/14/2012 MRN: [id number] DOB: 08-Jan-1946    CC: Celedonio Savage, MD,    DIAGNOSIS: The encounter diagnosis was Free monoclonal light chain.   HISTORY OF PRESENT ILLNESS:Erin Jordan is a 67 y.o. African American female who has a past medical history significant for chronic kidney disease stage III, hypertension, diabetes mellitus, hyperkalemia secondary to chronic kidney disease, coronary artery disease, congestive heart failure, myocardial infarction in 2006, and hyperlipidemia who is referred to the [facility] for a monoclonal light chain with an increase in kappa light chains.  The patient reports that she was being seen by nephrology years ago and then released. Subsequently, she was seen by her primary care physician for an annual checkup. Laboratory work was performed and the patient was referred back to nephrology.    While being seen in the nephrology, laboratory work was performed in August of 2013 revealing a nonspecific increase in the beta 2 region of the protein electrophoresis, elevation and kappa free light chain, and a monoclonal free lambda light chain present without IgG, IgA, IgM, IgD, or IgE heavy chain detection. She does have a decreased IgM level of 28. Otherwise, CBC with differential is unremarkable. With this information, sugars referred to the [facility]. She had an original new patient appointment on 02/18/2012 and she missed that appointment. She was rescheduled to today. Next  The patient reports that she feels well. She feels no different today than she did last year at this time. She denies any recent infections requiring antibiotic use. She denies any antibiotic usage the past 6 months. She denies any urinary complaints. She has no B. symptoms including fevers, chills, night sweats, weight loss. She does admit to losing weight with an approximate 30 pound  weight loss over 2 years but she was trying to lose weight 22 obesity. She reports that one time approximately 2 years ago she weighed 198 pounds. She started eating healthier and has lost 30 pounds since that time.  The patient otherwise denies any complaints. She denies any headaches, dizziness, double vision, fevers, chills, night sweats, nausea, vomiting, diarrhea, constipation, abdominal pain, and chest pain, heart palpitations, blood in stool, black tarry stool, hematuria, urinary pain, urinary burning, urinary frequency, hemoptysis, sputum production.   FAMILY HISTORY: family history includes Heart attack in her mother and Hypertension in her mother.  The patient reports that her mother passed weight the age of 67 do to an "accidental fire".  The patient's father passed away when the patient was a baby of unknown cause. She has 6 children. All of which are to the same husband. She has a 76 year old daughter who had a cancer. Patient does not know kind of cancer. She has a 58 and 40 year old daughter who are healthy. She is 3 sons ages 55, 17, and 75 who are all healthy. She does admit to a maternal grandmother with cancer. Again, this cancer is an unknown type but the patient 2 she had multiple surgeries associated with a malignancy.  PAST MEDICAL HISTORY:  has a past medical history of Arteriosclerotic cardiovascular disease (ASCVD) (2006); Arthritis; Diabetes mellitus, type 2; Hypertension; Hyperlipidemia; and Chronic kidney disease, stage 3, mod decreased GFR.       CURRENT MEDICATIONS: See CHL.   SOCIAL HISTORY: Patient was born in [place]. She finished high school. 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  She been married for 51 years. She resides with her youngest daughter while her husband stays at home. This living arrangement was necessary due to the fact that the patient and her husband combined income is too much she received  benefits so they live separately. She denies any alcohol, tobacco, or illicit drug abuse.   Gynecologic history: Patient reached menopause secondary to surgical hysterectomy. She is G6 P6. She did not breast-feed her children. She reports a history of a breast biopsy which was negative for malignancy.  Psychosocial history: Unknown, patient's husband accompanies her today but remains in the waiting room.   ALLERGIES: Review of patient's allergies indicates no known allergies.   LABORATORY DATA:  Results for orders placed in visit on 04/14/12 (from the past 48 hour(s))  CBC WITH DIFFERENTIAL     Status: Normal   Collection Time   04/14/12  4:13 PM      Component Value Range Comment   WBC 7.2  4.0 - 10.5 K/uL    RBC 4.69  3.87 - 5.11 MIL/uL    Hemoglobin 14.0  12.0 - 15.0 g/dL    HCT 41.1  36.0 - 46.0 %    MCV 87.6  78.0 - 100.0 fL    MCH 29.9  26.0 - 34.0 pg    MCHC 34.1  30.0 - 36.0 g/dL    RDW 12.8  11.5 - 15.5 %    Platelets 295  150 - 400 K/uL    Neutrophils Relative 65  43 - 77 %    Neutro Abs 4.6  1.7 - 7.7 K/uL    Lymphocytes Relative 23  12 - 46 %    Lymphs Abs 1.7  0.7 - 4.0 K/uL    Monocytes Relative 7  3 - 12 %    Monocytes Absolute 0.5  0.1 - 1.0 K/uL    Eosinophils Relative 5  0 - 5 %    Eosinophils Absolute 0.3  0.0 - 0.7 K/uL    Basophils Relative 1  0 - 1 %    Basophils Absolute 0.0  0.0 - 0.1 K/uL        RADIOGRAPHY: No results found.      REVIEW OF SYSTEMS: Patient reports no health concerns.   PHYSICAL EXAM:  height is 5' 1.75" (1.568 m) and weight is 160 lb 3.2 oz (72.666 kg). Her oral temperature is 98.6 F (37 C). Her blood pressure is 128/72 and her pulse is 94. Her respiration is 16.  General appearance: alert, cooperative, appears stated age, no distress and moderately obese, poor historian Head: Normocephalic, without obvious abnormality, atraumatic Neck: no adenopathy, supple, symmetrical, trachea midline and thyroid not enlarged, symmetric,  no tenderness/mass/nodules Lymph nodes: Cervical, supraclavicular, and axillary nodes normal. Resp: clear to auscultation bilaterally and normal percussion bilaterally Back: no tenderness to percussion or palpation, symmetric, no curvature. ROM normal. No CVA tenderness. Cardio: regular rate and rhythm, S1, S2 normal, no murmur, click, rub or gallop GI: soft, non-tender; bowel sounds normal; no masses,  no organomegaly Extremities: extremities normal, atraumatic, no cyanosis or edema Neurologic: Alert and oriented X 3, normal strength and tone. Normal symmetric reflexes. Normal coordination and gait     IMPRESSION:  1. Monoclonal light chain, lambda 2. Elevated Kappa light chain 3. Low IgM 4. Chronic Kidney disease, stage 3, followed by nephrology. 5. HTN 6. DM 7. CAD 8. CHF 9. MI in 2006 10. Hyperlipidemia  PLAN:  1. I personally reviewed and went over laboratory results with  the patient. 2. Discussion regarding monoclonal light chain detection on lab work 3. Discussion regarding low IgM 4. Patient education regarding lab findings in August 2013 5. Lab work today: CBC diff, CMET, LDH, CRP, MM panel, B-2 microglobulin, 24 hour urine collection (protein, CrCl, electrophoresis and immunofixation). 6. Return in 3 weeks for follow-up.  All questions were answered. The patient is call the clinic with any problems, questions, or concerns.  Patient and plan discussed with Dr. Everardo All and he is in agreement with the aforementioned. Patient seen by Dr. Everardo All as well.   Trevious Rampey

## 2012-04-14 NOTE — Patient Instructions (Addendum)
[facility] Discharge Instructions  RECOMMENDATIONS MADE BY THE CONSULTANT AND ANY TEST RESULTS WILL BE SENT TO YOUR REFERRING PHYSICIAN.  EXAM FINDINGS BY THE PHYSICIAN TODAY AND SIGNS OR SYMPTOMS TO REPORT TO CLINIC OR PRIMARY PHYSICIAN: exam and discussion by PA.  Need to check some blood work and do urine collection to get some additional information.  Once we have all of the results we will get you back and discuss results.  MEDICATIONS PRESCRIBED:  none  INSTRUCTIONS GIVEN AND DISCUSSED: See instructions below for 24 hour urine collection.  Begin collection tomorrow morning and bring it in on Thursday  SPECIAL INSTRUCTIONS/FOLLOW-UP: We will see you back in 3 weeks.  Thank you for choosing [facility] to provide your oncology and hematology care.  To afford each patient quality time with our providers, please arrive at least 15 minutes before your scheduled appointment time.  With your help, our goal is to use those 15 minutes to complete the necessary work-up to ensure our physicians have the information they need to help with your evaluation and healthcare recommendations.    Effective January 1st, 2014, we ask that you re-schedule your appointment with our physicians should you arrive 10 or more minutes late for your appointment.  We strive to give you quality time with our providers, and arriving late affects you and other patients whose appointments are after yours.    Again, thank you for choosing [facility].  Our hope is that these requests will decrease the amount of time that you wait before being seen by our physicians.       _____________________________________________________________  Should you have questions after your visit to [facility], please contact our office at [phone] between the hours of 8:30 a.m. and 5:00 p.m.  Voicemails left after 4:30 p.m. will not be returned until the following business  day.  For prescription refill requests, have your pharmacy contact our office with your prescription refill request.      24-Hour Urine Collection HOME CARE  When you get up in the morning on the day you do this test, pee (urinate) in the toilet and flush. Make a note of the time. This will be your start time on the day of collection and the end time on the next morning.   From then on, save all your pee (urine) in the plastic jug that was given to you.   You should stop collecting your pee 24 hours after you started.   If the plastic jug that is given to you already has liquid in it, that is okay. Do not throw out the liquid or rinse out the jug. Some tests need the liquid to be added to your pee.   Keep your plastic jug cool (in an ice chest or the refrigerator) during the test.   When the 24 hours is over, bring your plastic jug to the clinic lab. Keep the jug cool (in an ice chest) while you are bringing it to the lab.  Document Released: 06/21/2008 Document Revised: 06/17/2011 Document Reviewed: 06/21/2008 [facility] Patient Information 2013 [place].

## 2012-04-15 LAB — KAPPA/LAMBDA LIGHT CHAINS: Kappa, lambda light chain ratio: 0.99 (ref 0.26–1.65)

## 2012-04-21 LAB — PROTEIN, URINE, 24 HOUR
Collection Interval-UPROT: 24 hours
Urine Total Volume-UPROT: 1725 mL

## 2012-04-21 NOTE — Addendum Note (Signed)
Addended by: Berneta Levins on: 04/21/2012 04:06 PM   Modules accepted: Orders

## 2012-04-22 LAB — MULTIPLE MYELOMA PANEL, SERUM
Albumin ELP: 57.3 % (ref 55.8–66.1)
Alpha-1-Globulin: 4.8 % (ref 2.9–4.9)
Gamma Globulin: 14.5 % (ref 11.1–18.8)
IgG (Immunoglobin G), Serum: 1120 mg/dL (ref 690–1700)

## 2012-04-23 LAB — UIFE/LIGHT CHAINS/TP QN, 24-HR UR
Albumin, U: DETECTED
Alpha 1, Urine: DETECTED — AB
Free Lambda Excretion/Day: 11.39 mg/d
Gamma Globulin, Urine: DETECTED — AB
Total Protein, Urine-Ur/day: 155 mg/d — ABNORMAL HIGH (ref 10–140)
Volume, Urine: 1725 mL

## 2012-04-23 LAB — IMMUNOFIXATION, URINE

## 2012-04-24 ENCOUNTER — Ambulatory Visit: Payer: PRIVATE HEALTH INSURANCE | Admitting: Urology

## 2012-05-01 ENCOUNTER — Encounter: Payer: Self-pay | Admitting: Oncology

## 2012-05-05 ENCOUNTER — Ambulatory Visit (HOSPITAL_COMMUNITY): Payer: PRIVATE HEALTH INSURANCE | Admitting: Oncology

## 2012-05-15 ENCOUNTER — Ambulatory Visit (INDEPENDENT_AMBULATORY_CARE_PROVIDER_SITE_OTHER): Payer: PRIVATE HEALTH INSURANCE | Admitting: Urology

## 2012-05-15 DIAGNOSIS — N1330 Unspecified hydronephrosis: Secondary | ICD-10-CM

## 2012-07-14 ENCOUNTER — Other Ambulatory Visit: Payer: Self-pay | Admitting: *Deleted

## 2012-07-14 MED ORDER — AMLODIPINE BESYLATE 10 MG PO TABS: 10.0000 mg | ORAL_TABLET | Freq: Every day | ORAL | Status: DC

## 2012-09-17 ENCOUNTER — Telehealth: Payer: Self-pay | Admitting: *Deleted

## 2012-09-17 MED ORDER — AMLODIPINE BESYLATE 10 MG PO TABS: 10.0000 mg | ORAL_TABLET | Freq: Every day | ORAL | Status: DC

## 2012-09-17 NOTE — Telephone Encounter (Signed)
Medication sent via escribe.  

## 2012-09-18 ENCOUNTER — Encounter: Payer: PRIVATE HEALTH INSURANCE | Admitting: Adult Health

## 2012-09-18 ENCOUNTER — Encounter: Payer: Self-pay | Admitting: Adult Health

## 2012-09-18 NOTE — Progress Notes (Signed)
Cancelled.  

## 2012-10-05 ENCOUNTER — Encounter: Payer: PRIVATE HEALTH INSURANCE | Admitting: Adult Health

## 2012-10-05 NOTE — Progress Notes (Signed)
NO show

## 2012-11-23 ENCOUNTER — Other Ambulatory Visit: Payer: Self-pay | Admitting: Cardiology

## 2012-12-25 ENCOUNTER — Other Ambulatory Visit: Payer: Self-pay | Admitting: Cardiology

## 2013-01-28 ENCOUNTER — Encounter: Payer: Self-pay | Admitting: *Deleted

## 2013-01-28 ENCOUNTER — Telehealth: Payer: Self-pay | Admitting: *Deleted

## 2013-01-28 NOTE — Telephone Encounter (Signed)
FAXED RX FROM [facility] FOR AMLODIPINE 10 MG #30

## 2013-01-28 NOTE — Telephone Encounter (Signed)
Noted pt has been prescribed 30 day supply to last pt until he can call the office for her past due follow up, however pt has not called in as advised on the last refill instructions, mailed pt letter to advise per protocol she is overdue and needs to call office to schedule apt in order to continue to receive medication refills, notation also sent with denied refill request for pt to have no further refills per protocol

## 2013-03-02 ENCOUNTER — Ambulatory Visit (INDEPENDENT_AMBULATORY_CARE_PROVIDER_SITE_OTHER): Payer: Medicare Other | Admitting: Cardiology

## 2013-03-02 ENCOUNTER — Encounter: Payer: Self-pay | Admitting: Cardiology

## 2013-03-02 VITALS — BP 187/92 | HR 80 | Ht 63.0 in | Wt 153.0 lb

## 2013-03-02 DIAGNOSIS — N183 Chronic kidney disease, stage 3 unspecified: Secondary | ICD-10-CM

## 2013-03-02 DIAGNOSIS — I2510 Atherosclerotic heart disease of native coronary artery without angina pectoris: Secondary | ICD-10-CM

## 2013-03-02 DIAGNOSIS — I10 Essential (primary) hypertension: Secondary | ICD-10-CM

## 2013-03-02 DIAGNOSIS — E785 Hyperlipidemia, unspecified: Secondary | ICD-10-CM

## 2013-03-02 MED ORDER — HYDROCHLOROTHIAZIDE 12.5 MG PO CAPS: 12.5000 mg | ORAL_CAPSULE | Freq: Every day | ORAL | Status: DC

## 2013-03-02 NOTE — Assessment & Plan Note (Signed)
Symptomatically stable without active angina. ECG reviewed above. Plan is to continue medical therapy and observation, reassuring ischemic workup last year noted.

## 2013-03-02 NOTE — Assessment & Plan Note (Signed)
Creatinine 1.5 in January of this year.

## 2013-03-02 NOTE — Assessment & Plan Note (Signed)
Plan to add HCTZ 12.5 mg daily back to her regimen, followup BMET.

## 2013-03-02 NOTE — Patient Instructions (Addendum)
Your physician wants you to follow-up in: 6 months You will receive a reminder letter in the mail two months in advance. If you don't receive a letter, please call our office to schedule the follow-up appointment. PLEASE HAVE BLOOD WORK DONE ONE WEEK PRIOR TO YOUR VISIT FOR ( liver function,lipid panel)   Your physician recommends that you return for lab work in: in 2 weeks  (BMET)   Your physician has recommended you make the following change in your medication:   START:   HCTZ  12.5 mg, take one tablet daily

## 2013-03-02 NOTE — Progress Notes (Signed)
Clinical Summary Erin Jordan is a 67 y.o.female presenting for an office visit. She is a former patient of Dr. Lattie Haw, last seen in April 2013. History is outlined below. Fortunately, she reports no angina symptoms or progressive shortness of breath. She states that she walks for exercise. Medications are reviewed below. It seems that HCTZ has fallen off of her regimen at some point, previously was on Prinzide.   Echocardiogram in February 2013 revealed mild to moderate LVH with LVEF 60-65%, no wall motion abnormalities, mild calcified aortic and mitral annulus, no other significant valvular abnormalities. Lexiscan Myoview from February 2013 was low risk, showing predominantly fixed inferolateral and inferior defect, LVEF 63%.  ECG today shows sinus rhythm with poor R-wave progression, nonspecific T-wave changes.  Lab work from January showed potassium 3.6, BUN 20, creatinine 1.5.   No Known Allergies  Current Outpatient Prescriptions  Medication Sig Dispense Refill  . amLODipine (NORVASC) 10 MG tablet TAKE 1 TABLET BY MOUTH ONCE A DAY.  30 tablet  0  . aspirin 81 MG tablet Take 81 mg by mouth daily.      Marland Kitchen atorvastatin (LIPITOR) 40 MG tablet Take 1 tablet (40 mg total) by mouth daily at 6 PM. FOR HIGH CHOLESTEROL.  30 tablet  3  . glipiZIDE (GLUCOTROL) 10 MG tablet Take 10 mg by mouth daily.      . isosorbide dinitrate (ISORDIL) 30 MG tablet Take 30 mg by mouth every morning.      Marland Kitchen lisinopril (PRINIVIL,ZESTRIL) 20 MG tablet Take 20 mg by mouth.      . nitroGLYCERIN (NITROSTAT) 0.4 MG SL tablet Place 1 tablet (0.4 mg total) under the tongue every 5 (five) minutes x 3 doses as needed for chest pain.  30 tablet  3  . potassium chloride (K-DUR,KLOR-CON) 10 MEQ tablet Take 10 mEq by mouth 2 (two) times daily.      . hydrochlorothiazide (MICROZIDE) 12.5 MG capsule Take 1 capsule (12.5 mg total) by mouth daily.  90 capsule  3   No current facility-administered medications for this visit.      Past Medical History  Diagnosis Date  . Coronary atherosclerosis of native coronary artery 2006    Multivessel status post CABG in [place]  . Arthritis   . Diabetes mellitus, type 2   . Essential hypertension, benign   . Hyperlipidemia   . Chronic kidney disease, stage 3, mod decreased GFR   . Free monoclonal light chain 04/14/2012    Social History Ms. Lasyone reports that she has never smoked. She has never used smokeless tobacco. Ms. Dossantos reports that she does not drink alcohol.  Review of Systems No palpitations, no claudication, no edema. No orthopnea or PND. Stable appetite. Otherwise negative.  Physical Examination Filed Vitals:   03/02/13 1032  BP: 187/92  Pulse: 80   Filed Weights   03/02/13 1032  Weight: 153 lb (69.4 kg)   Patient appears comfortable at rest. HEENT: Conjunctiva and lids normal, oropharynx clear. Neck: Supple, no elevated JVP or carotid bruits, no thyromegaly. Lungs: Clear to auscultation, diminished at bases, nonlabored breathing at rest. Cardiac: Regular rate and rhythm, no S3 or significant systolic murmur, no pericardial rub. Abdomen: Soft, nontender, bowel sounds present, no guarding or rebound. Extremities: No pitting edema, distal pulses 2+. Skin: Warm and dry. Musculoskeletal: No kyphosis. Neuropsychiatric: Alert and oriented x3, affect grossly appropriate.   Problem List and Plan   Coronary atherosclerosis of native coronary artery Symptomatically stable without active angina.  ECG reviewed above. Plan is to continue medical therapy and observation, reassuring ischemic workup last year noted.  Essential hypertension, benign Plan to add HCTZ 12.5 mg daily back to her regimen, followup BMET.  Chronic kidney disease, stage 3, mod decreased GFR Creatinine 1.5 in January of this year.  Hyperlipidemia She continues on Lipitor, no recent followup lipid panel. FLP and LFT will be arranged.    Satira Sark, M.D.,  F.A.C.C.

## 2013-03-02 NOTE — Assessment & Plan Note (Signed)
She continues on Lipitor, no recent followup lipid panel. FLP and LFT will be arranged.

## 2013-03-11 ENCOUNTER — Other Ambulatory Visit: Payer: Self-pay

## 2013-03-11 DIAGNOSIS — I2510 Atherosclerotic heart disease of native coronary artery without angina pectoris: Secondary | ICD-10-CM

## 2013-03-11 DIAGNOSIS — I10 Essential (primary) hypertension: Secondary | ICD-10-CM

## 2017-02-23 IMAGING — US US THYROID
1 series · 12 of 25 positions shown · non-contrast
Comparison: None.

CLINICAL DATA: Goiter, weight loss

EXAM:
THYROID ULTRASOUND
TECHNIQUE: Ultrasound examination of the thyroid gland and adjacent soft
tissues was performed.

[Series 1: us thyroid · 0.07mm/px · 12 of 87 slices shown]
[im 4/87]
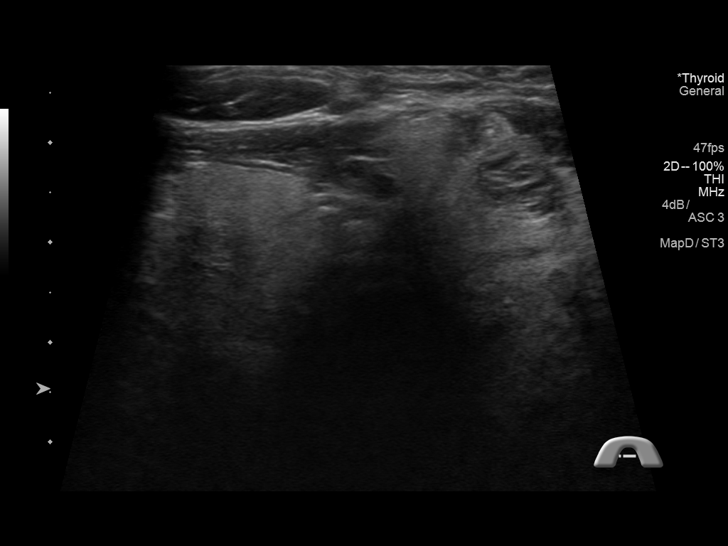
[im 11/87]
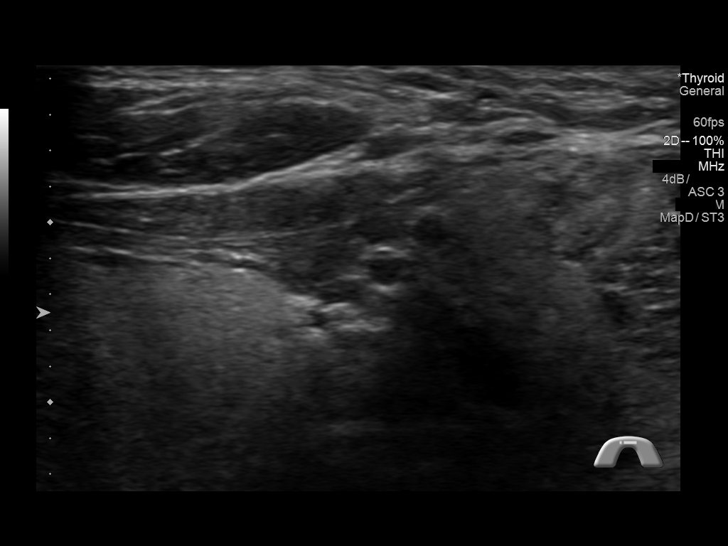
[im 18/87]
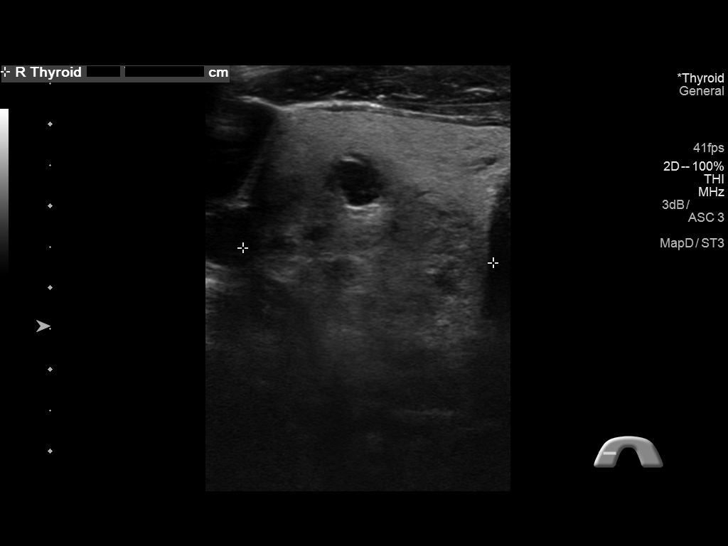
[im 26/87]
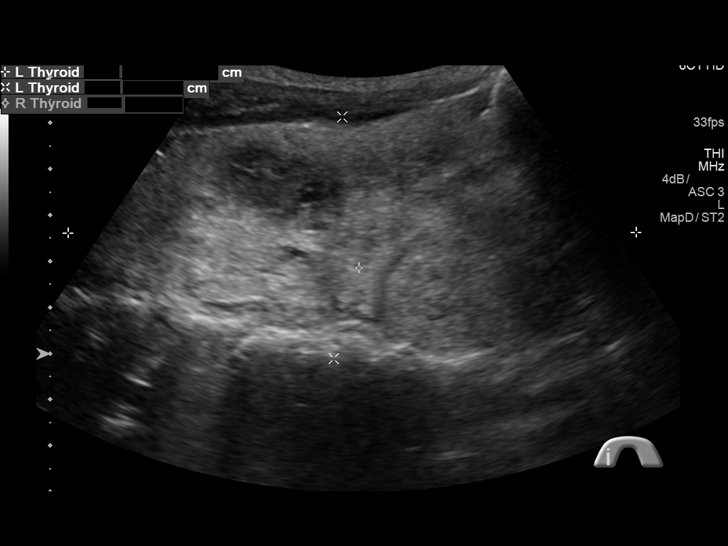
[im 33/87]
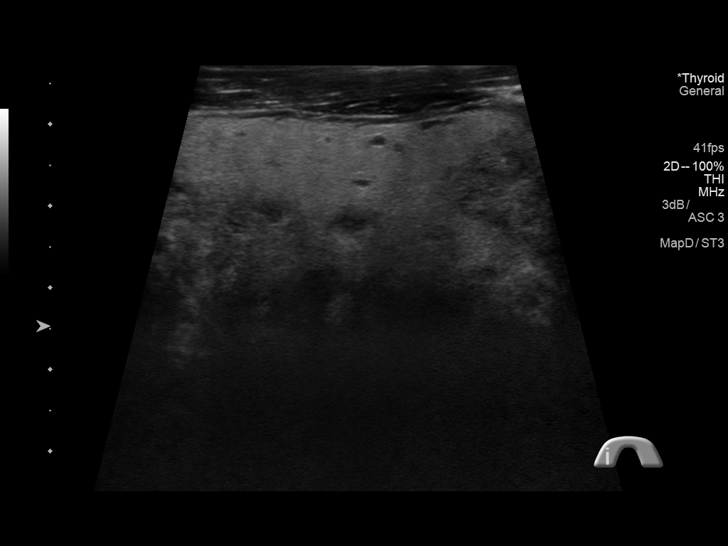
[im 40/87]
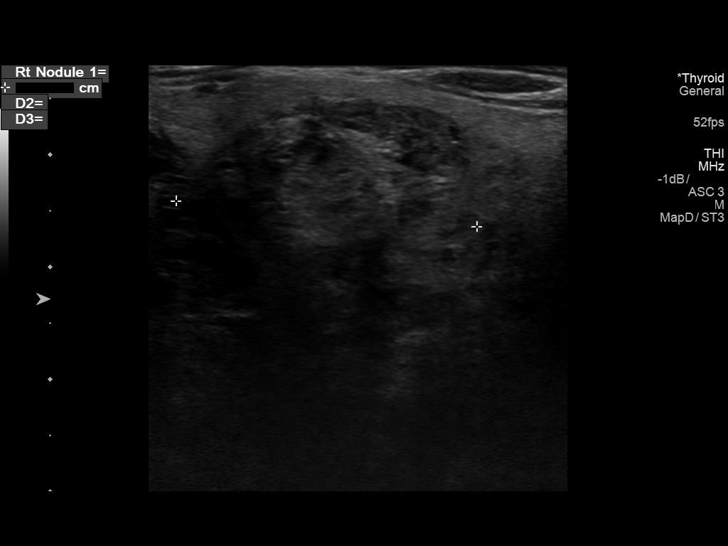
[im 47/87]
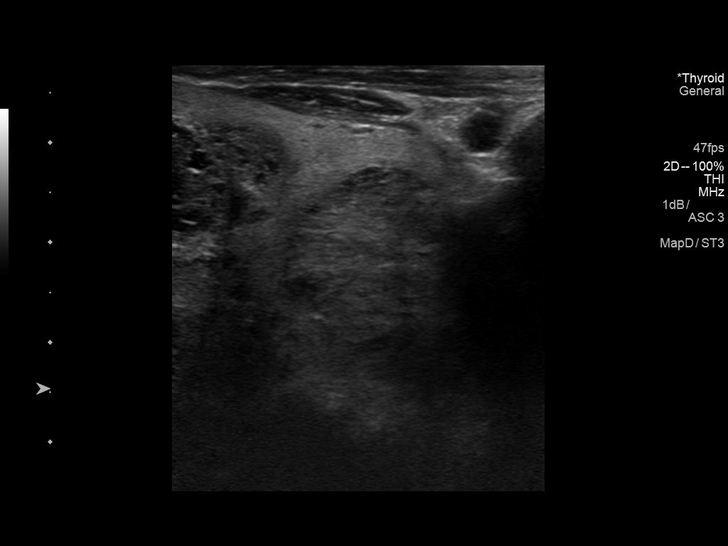
[im 54/87]
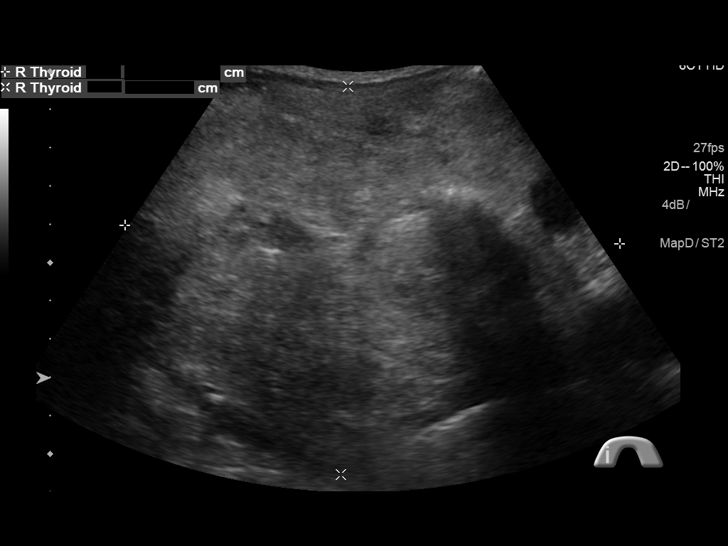
[im 61/87]
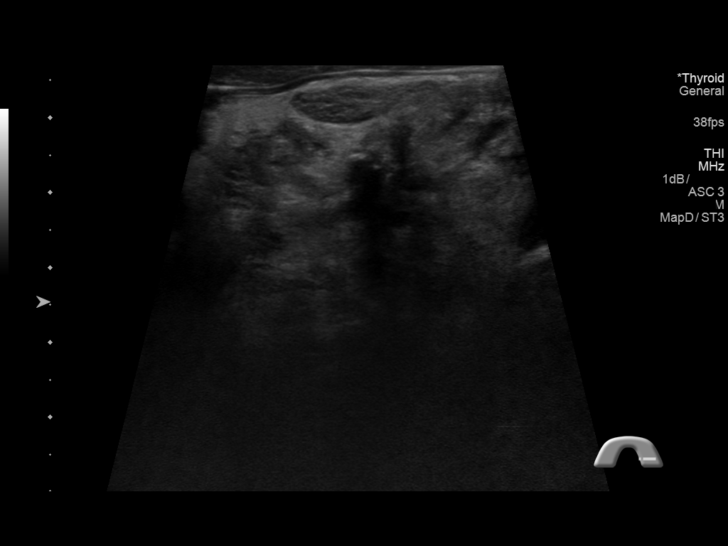
[im 69/87]
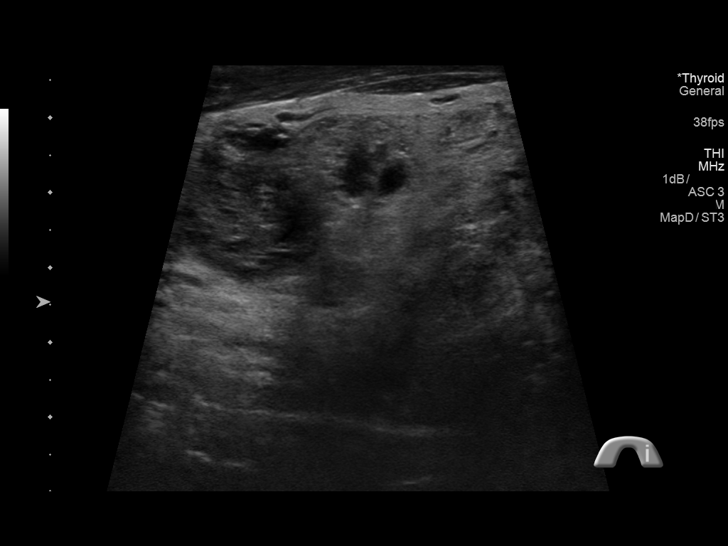
[im 76/87]
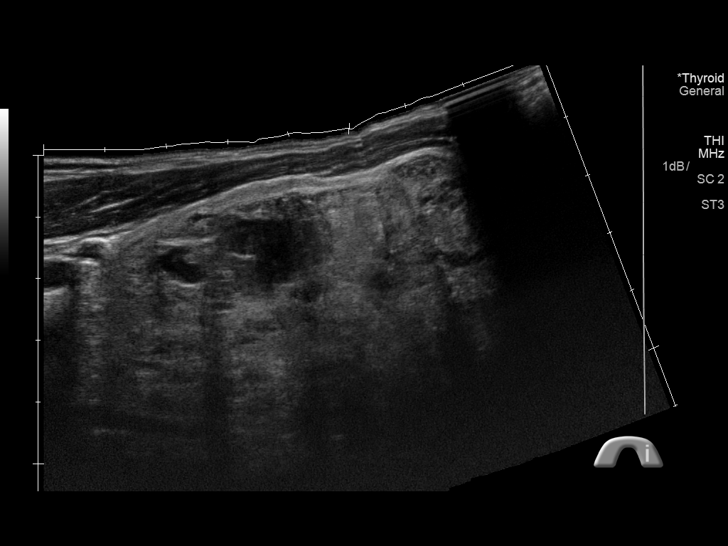
[im 83/87]
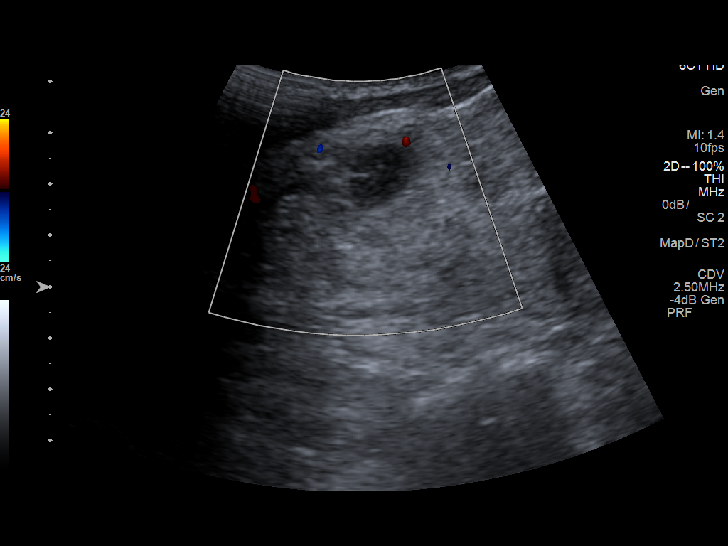

[12 of 25 positions shown; findings below may reference images not displayed]

FINDINGS: Parenchymal Echotexture: Markedly heterogenous

Isthmus: 0.6 cm thickness

Right lobe: 12.9 x 10.2 x 3.1 cm

Left lobe: 12.3 x 5.3 x 5.9 cm

_________________________________________________________

Estimated total number of nodules >/= 1 cm: 4

Number of spongiform nodules >/=  2 cm not described below (TR1): 0

Number of mixed cystic and solid nodules >/= 1.5 cm not described
below (TR2): 0

_________________________________________________________

Nodule # 1:

Location: Right; Superior

Maximum size: 2.7 cm; Other 2 dimensions: 2.3 x 1.7 cm

Composition: solid/almost completely solid (2)

Echogenicity: isoechoic (1)

Shape: not taller-than-wide (0)

Margins: smooth (0)

Echogenic foci: none (0)

ACR TI-RADS total points: 3.

ACR TI-RADS risk category: TR3 (3 points).

ACR TI-RADS recommendations:

**Given size (>/= 2.5 cm) and appearance, fine needle aspiration of
this mildly suspicious nodule should be considered based on TI-RADS
criteria.

_________________________________________________________

Nodule # 2:

Location: Right; Mid

Maximum size: 2.4 cm; Other 2 dimensions: 2.3 x 1.7 cm

Composition: solid/almost completely solid (2)

Echogenicity: isoechoic (1)

Shape: taller-than-wide (3)

Margins: ill-defined (0)

Echogenic foci: none (0)

ACR TI-RADS total points: 6.

ACR TI-RADS risk category: TR4 (4-6 points).

ACR TI-RADS recommendations:

**Given size (>/= 1.5 cm) and appearance, fine needle aspiration of
this moderately suspicious nodule should be considered based on
TI-RADS criteria.

_________________________________________________________

Nodule # 3:

Location: Left; Superior medial

Maximum size: 3.3 cm; Other 2 dimensions: 2.2 x 2.1 cm

Composition: solid/almost completely solid (2)

Echogenicity: hypoechoic (2)

Shape: not taller-than-wide (0)

Margins: smooth (0)

Echogenic foci: none (0)

ACR TI-RADS total points: 4.

ACR TI-RADS risk category: TR4 (4-6 points).

ACR TI-RADS recommendations:

**Given size (>/= 1.5 cm) and appearance, fine needle aspiration of
this moderately suspicious nodule should be considered based on
TI-RADS criteria.

_________________________________________________________

Nodule # 4:

Location: Left; Superior lateral

Maximum size: 2.1 cm; Other 2 dimensions: 2 x 1.6 cm

Composition: solid/almost completely solid (2)

Echogenicity: hypoechoic (2)

Shape: not taller-than-wide (0)

Margins: ill-defined (0)

Echogenic foci: none (0)

ACR TI-RADS total points: 4.

ACR TI-RADS risk category: TR4 (4-6 points).

ACR TI-RADS recommendations:

**Given size (>/= 1.5 cm) and appearance, fine needle aspiration of
this moderately suspicious nodule should be considered based on
TI-RADS criteria.
IMPRESSION: 1. Marked thyromegaly with bilateral nodules as above.
2. Recommend FNA biopsy of moderately suspicious 3.3 cm superomedial
left and 3.4 cm mid right nodules.
3. Recommend 1 year follow-up surveillance ultrasound of additional
lesions.

The above is in keeping with the ACR TI-RADS recommendations - [HOSPITAL] [4G];[DATE].

## 2017-04-23 ENCOUNTER — Emergency Department (HOSPITAL_COMMUNITY): Payer: Medicare HMO

## 2017-04-23 ENCOUNTER — Emergency Department (HOSPITAL_COMMUNITY)
Admission: EM | Admit: 2017-04-23 | Discharge: 2017-04-23 | Disposition: A | Discharge status: Home / Self Care | Payer: Medicare HMO | Attending: Emergency Medicine | Admitting: Emergency Medicine

## 2017-04-23 ENCOUNTER — Encounter (HOSPITAL_COMMUNITY): Payer: Self-pay | Admitting: Cardiology

## 2017-04-23 DIAGNOSIS — N183 Chronic kidney disease, stage 3 (moderate): Secondary | ICD-10-CM | POA: Insufficient documentation

## 2017-04-23 DIAGNOSIS — S0990XA Unspecified injury of head, initial encounter: Secondary | ICD-10-CM | POA: Diagnosis present

## 2017-04-23 DIAGNOSIS — Z7982 Long term (current) use of aspirin: Secondary | ICD-10-CM | POA: Insufficient documentation

## 2017-04-23 DIAGNOSIS — Z79899 Other long term (current) drug therapy: Secondary | ICD-10-CM | POA: Diagnosis not present

## 2017-04-23 DIAGNOSIS — Y999 Unspecified external cause status: Secondary | ICD-10-CM | POA: Diagnosis not present

## 2017-04-23 DIAGNOSIS — W101XXA Fall (on)(from) sidewalk curb, initial encounter: Secondary | ICD-10-CM | POA: Insufficient documentation

## 2017-04-23 DIAGNOSIS — I129 Hypertensive chronic kidney disease with stage 1 through stage 4 chronic kidney disease, or unspecified chronic kidney disease: Secondary | ICD-10-CM | POA: Diagnosis not present

## 2017-04-23 DIAGNOSIS — S0083XA Contusion of other part of head, initial encounter: Secondary | ICD-10-CM | POA: Insufficient documentation

## 2017-04-23 DIAGNOSIS — W0110XA Fall on same level from slipping, tripping and stumbling with subsequent striking against unspecified object, initial encounter: Secondary | ICD-10-CM | POA: Diagnosis not present

## 2017-04-23 DIAGNOSIS — M542 Cervicalgia: Secondary | ICD-10-CM | POA: Diagnosis not present

## 2017-04-23 DIAGNOSIS — Y9301 Activity, walking, marching and hiking: Secondary | ICD-10-CM | POA: Insufficient documentation

## 2017-04-23 DIAGNOSIS — E1122 Type 2 diabetes mellitus with diabetic chronic kidney disease: Secondary | ICD-10-CM | POA: Diagnosis not present

## 2017-04-23 DIAGNOSIS — Y929 Unspecified place or not applicable: Secondary | ICD-10-CM | POA: Insufficient documentation

## 2017-04-23 DIAGNOSIS — T07XXXA Unspecified multiple injuries, initial encounter: Secondary | ICD-10-CM

## 2017-04-23 DIAGNOSIS — I2510 Atherosclerotic heart disease of native coronary artery without angina pectoris: Secondary | ICD-10-CM | POA: Diagnosis not present

## 2017-04-23 DIAGNOSIS — M25531 Pain in right wrist: Secondary | ICD-10-CM | POA: Diagnosis not present

## 2017-04-23 IMAGING — DX DG WRIST COMPLETE 3+V*R*
4 series · 4 of 4 positions shown · non-contrast
Comparison: None.

CLINICAL DATA: Pain

EXAM:
RIGHT WRIST - COMPLETE 3+ VIEW

[wrist pa]
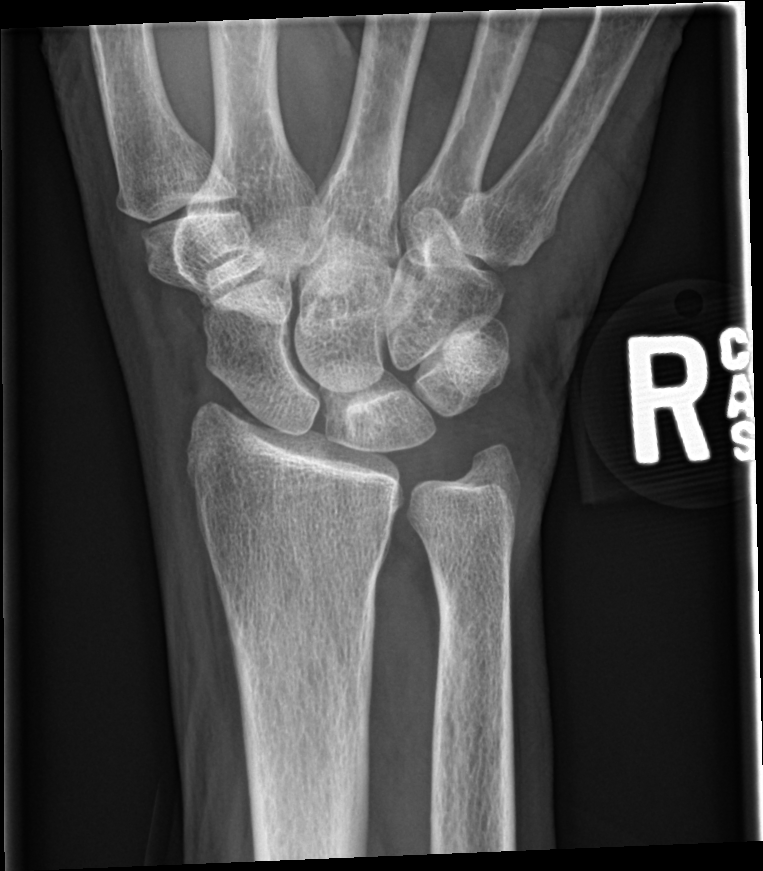

[wrist navicular]
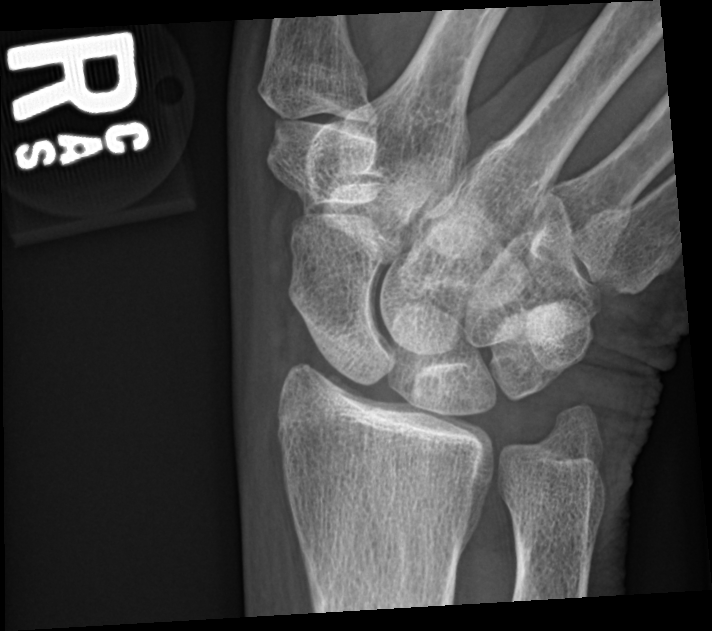

[wrist obl]
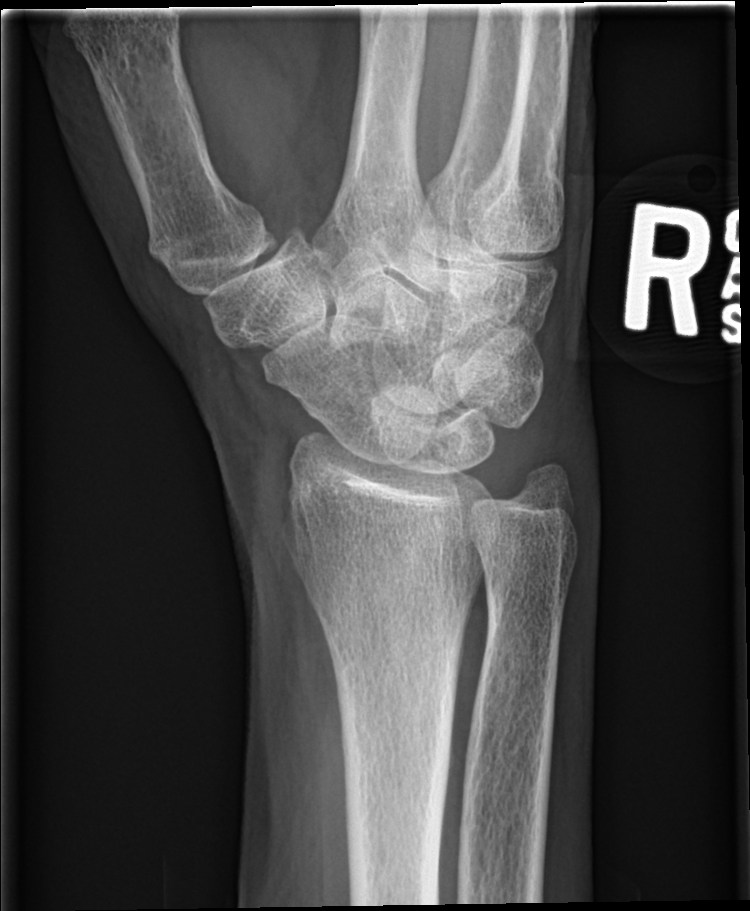

[wrist lat]
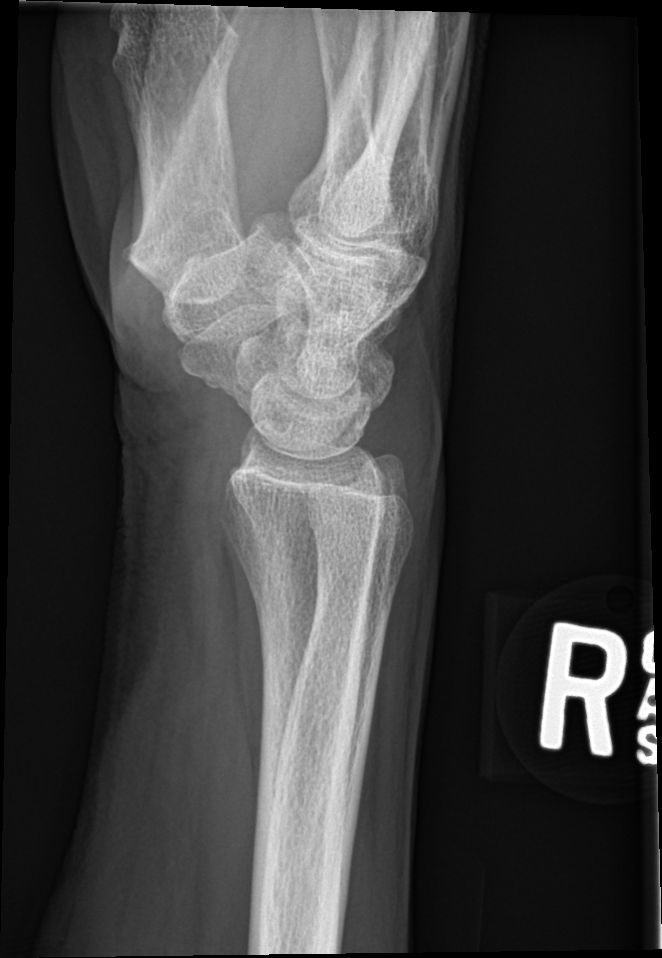

[4 of 4 positions shown; findings below may reference images not displayed]

FINDINGS: There is no evidence of fracture or dislocation. There is no
evidence of arthropathy or other focal bone abnormality. Soft
tissues are unremarkable.
IMPRESSION: Negative.

## 2017-04-23 IMAGING — CT CT MAXILLOFACIAL W/O CM
3 of 11 series · 16 of 47 positions shown, 18 images · non-contrast
Comparison: None.

CLINICAL DATA: Fall.

EXAM:
CT HEAD WITHOUT CONTRAST
CT MAXILLOFACIAL WITHOUT CONTRAST
CT CERVICAL SPINE WITHOUT CONTRAST
TECHNIQUE: Multidetector CT imaging of the head, cervical spine, and
maxillofacial structures were performed using the standard protocol
without intravenous contrast. Multiplanar CT image reconstructions
of the cervical spine and maxillofacial structures were also
generated.

[Series 8: max soft · axial · 0.33mm/px · z∈[+1456,+1556]mm · 6 of 80 slices shown]
[im 10/80  brain]
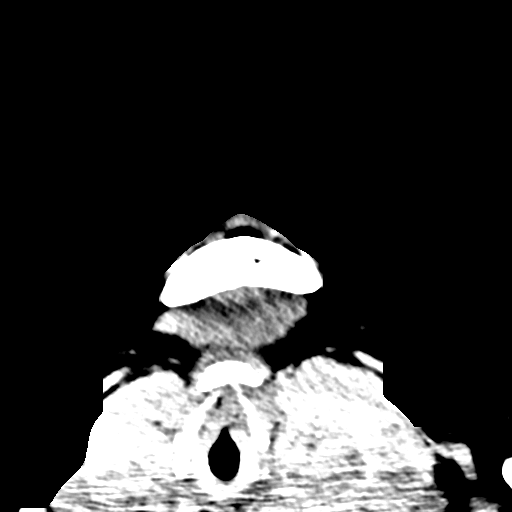
[im 20/80  brain]
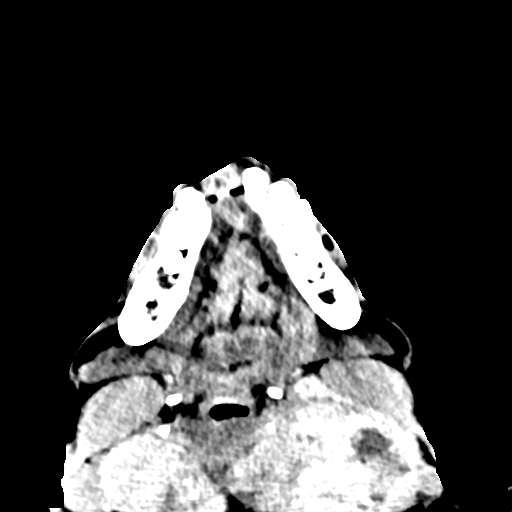
[im 30/80  brain]
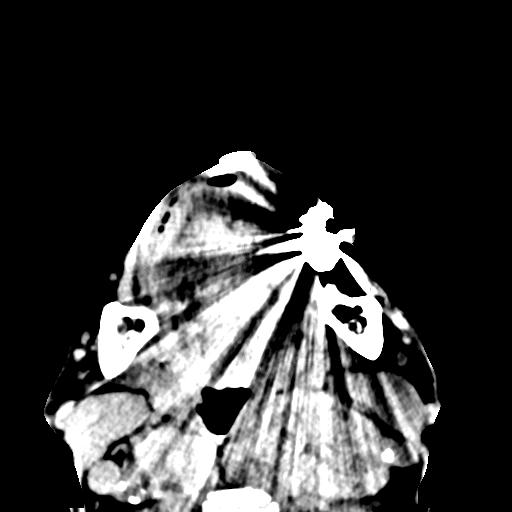
[im 40/80  brain]
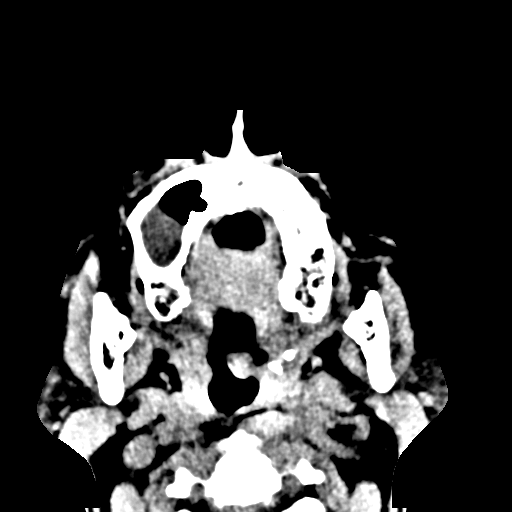
[im 50/80  brain]
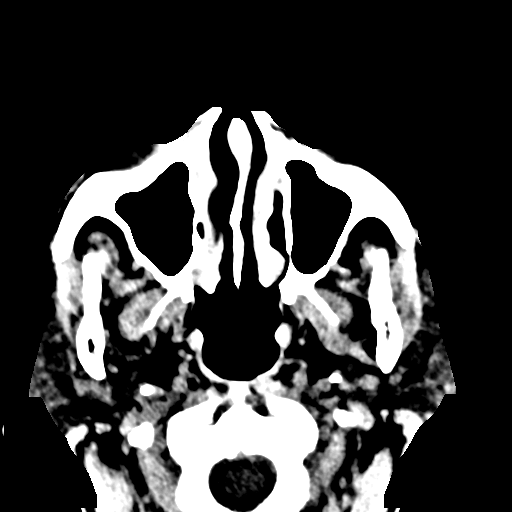
[im 60/80  brain]
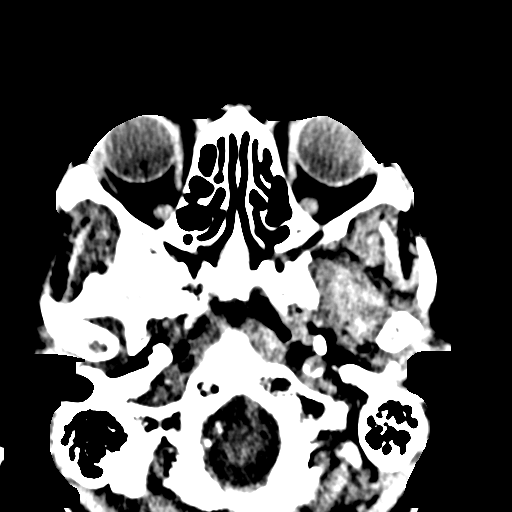

[Series 12: coronal soft · coronal · 0.33mm/px · 2 of 72 slices shown]
[im 24/72  bone]
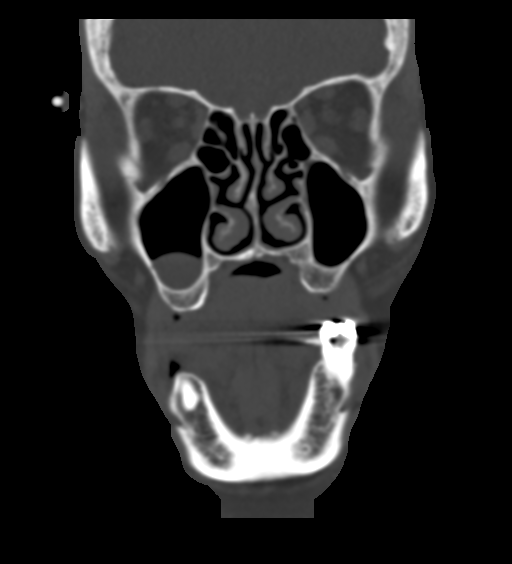
[im 48/72  bone]
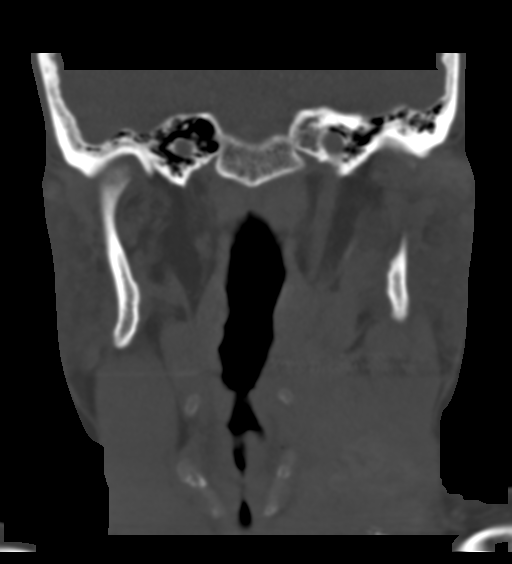

[Series 21: orthogonal axials · axial · 0.21mm/px · z∈[+1415,+1529]mm · 8 of 94 slices shown, 10 images]
[im 11/94  brain]
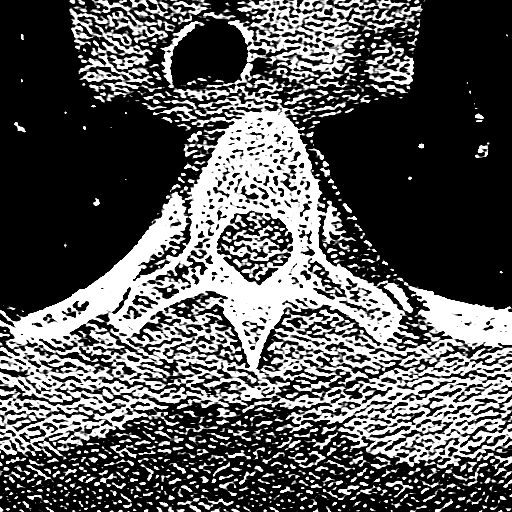
[im 11/94  bone]
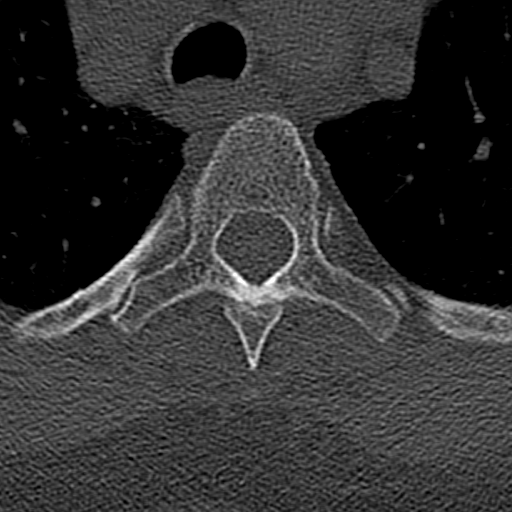
[im 21/94  bone]
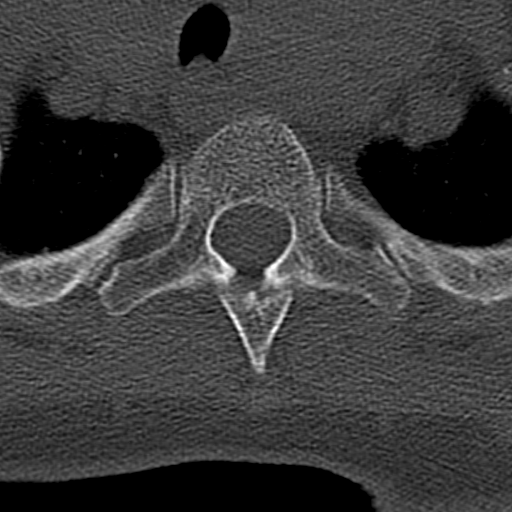
[im 32/94  bone]
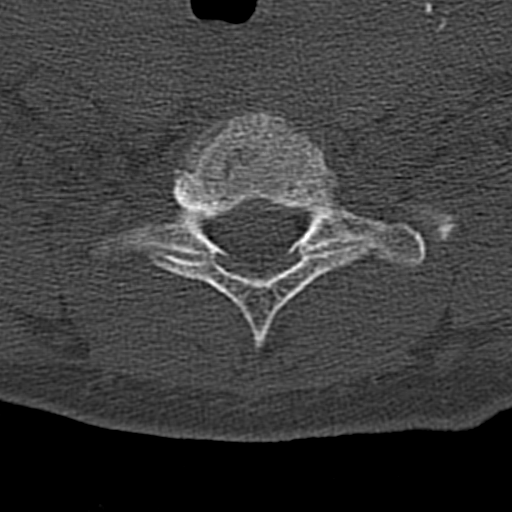
[im 42/94  bone]
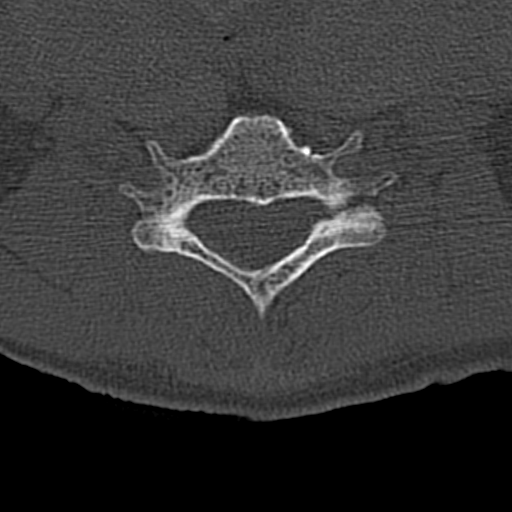
[im 52/94  brain]
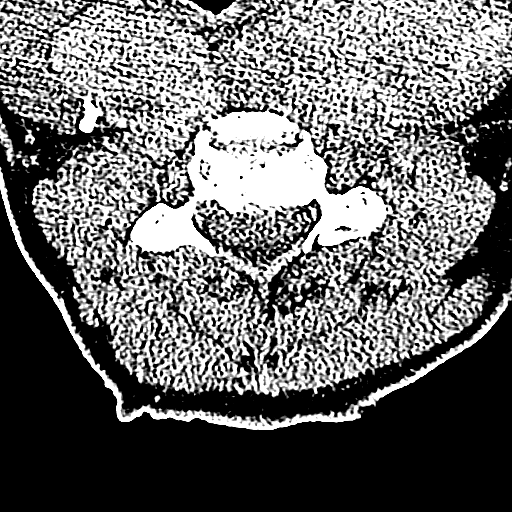
[im 52/94  bone]
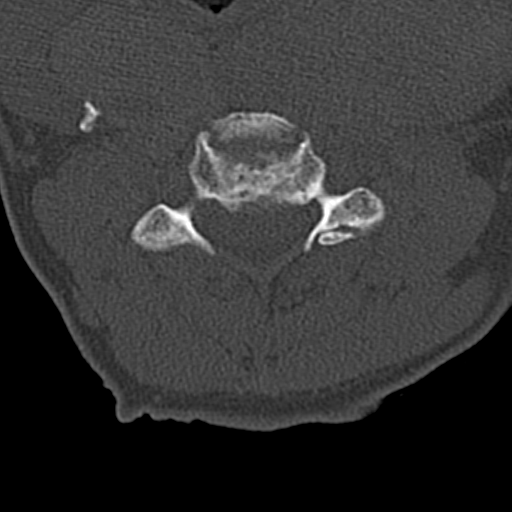
[im 63/94  bone]
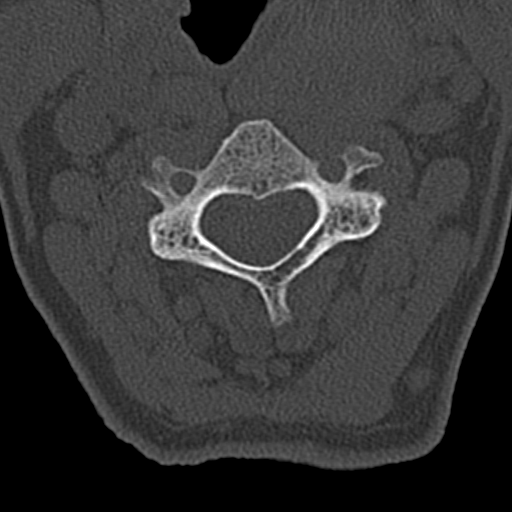
[im 73/94  bone]
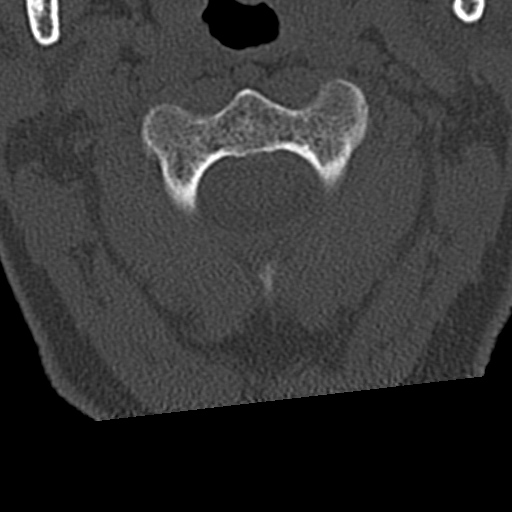
[im 83/94  bone]
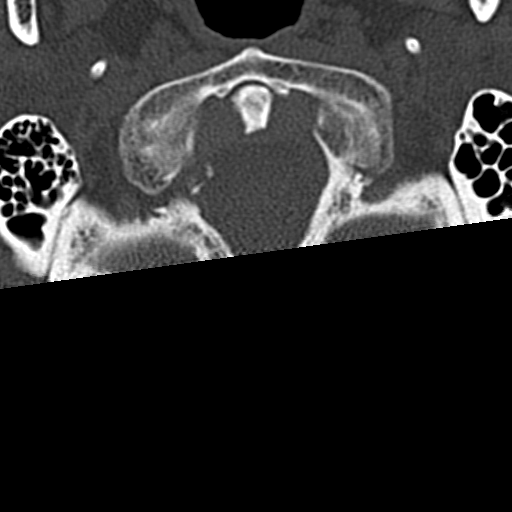

[16 of 47 positions shown; findings below may reference images not displayed]

FINDINGS: CT HEAD FINDINGS

Brain: No evidence of acute infarction, hemorrhage, hydrocephalus,
extra-axial collection or mass lesion/mass effect. Moderate
periventricular and subcortical white matter hypodensities are
nonspecific, but likely reflect chronic microvascular ischemic white
matter disease.

Vascular: Atherosclerotic calcifications.  No hyperdense vessel.

Skull: Normal. Negative for fracture or focal lesion.

Other: None.

CT MAXILLOFACIAL FINDINGS

Osseous: No fracture or mandibular dislocation. No destructive
process.

Orbits: Negative. No traumatic or inflammatory finding.

Sinuses: Mucous retention cyst in the right maxillary sinus. The
remaining paranasal sinuses and mastoid air cells are clear.

Soft tissues: Negative.

CT CERVICAL SPINE FINDINGS

Alignment: Reversal of the normal cervical lordosis, centered at C5.
Sagittal alignment is maintained.

Skull base and vertebrae: No acute fracture. No primary bone lesion
or focal pathologic process.

Soft tissues and spinal canal: No prevertebral fluid or swelling. No
visible canal hematoma.

Disc levels: Mild to moderate disc height loss and uncovertebral
hypertrophy from C4-C5 through C7-T1.

Upper chest: The lung apices are clear. Markedly enlarged,
heterogeneous thyroid gland extending superiorly to the level of C2.
The left thyroid lobe measures up to 11.0 cm in craniocaudal
dimension.

Other: None.
IMPRESSION: 1. No acute intracranial abnormality. Moderate chronic microvascular
ischemic white matter disease.
2. No acute facial fracture.
3.  No acute cervical spine fracture.
4. Markedly enlarged, heterogeneous thyroid goiter. Consider further
evaluation with thyroid ultrasound if not previously performed.

## 2017-04-23 MED ORDER — ACETAMINOPHEN 325 MG PO TABS
650.0000 mg | ORAL_TABLET | Freq: Once | ORAL | Status: AC
  Administered 2017-04-23: 650 mg via ORAL
  Filled 2017-04-23: qty 2

## 2017-04-23 NOTE — ED Triage Notes (Signed)
[place] today [place] of courthouse.  States she tripped stepping up on sidewalk.  C/o pain to right hand and right side of face

## 2017-04-23 NOTE — ED Provider Notes (Signed)
[facility] EMERGENCY DEPARTMENT Provider Note   CSN: [id number] Arrival date & time: 04/23/17  0932     History   Chief Complaint Chief Complaint  Patient presents with  . Fall    HPI Erin Jordan is a 72 y.o. female.  [place] she was stepping up onto a curb, misstepped and fell forward striking her face.  She did not lose consciousness.  She came here by private vehicle for evaluation.  She also complains of pain in her right knee, and right wrist region.  There were no preceding symptoms.  She denies recent illnesses.  There are no other known modifying factors.  HPI  Past Medical History:  Diagnosis Date  . Arthritis   . Chronic kidney disease, stage 3, mod decreased GFR (HCC)   . Coronary atherosclerosis of native coronary artery 2006   Multivessel status post CABG in [place]  . Diabetes mellitus, type 2 ([place])   . Essential hypertension, benign   . 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 Hyperlipidemia     Patient Active Problem List   Diagnosis Date Noted  . 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 Coronary atherosclerosis of native coronary artery   . Diabetes mellitus, type 2 ([place])   . Essential hypertension, benign   . Chronic kidney disease, stage 3, mod decreased GFR (HCC)   . Obesity 06/02/2011  . Hyperlipidemia 06/02/2011    Past Surgical History:  Procedure Laterality Date  . ABDOMINAL HYSTERECTOMY    . CORONARY ARTERY BYPASS GRAFT  2006   [place], [place]    OB History    No data available       Home Medications    Prior to Admission medications   Medication Sig Start Date End Date Taking? Authorizing Provider  amLODipine (NORVASC) 10 MG tablet TAKE 1 TABLET BY MOUTH ONCE A DAY. 12/25/12  Yes Lendon Colonel, NP  aspirin 81 MG tablet Take 81 mg by mouth daily.    [provider]  atorvastatin (LIPITOR) 40 MG tablet Take 1 tablet (40 mg total) by mouth daily at 6 PM. FOR HIGH  CHOLESTEROL. 06/04/11 03/02/13  Rexene Alberts, MD  glipiZIDE (GLUCOTROL) 10 MG tablet Take 10 mg by mouth daily.    [provider]  hydrochlorothiazide (MICROZIDE) 12.5 MG capsule Take 1 capsule (12.5 mg total) by mouth daily. 03/02/13   Satira Sark, MD  isosorbide dinitrate (ISORDIL) 30 MG tablet Take 30 mg by mouth every morning.    [provider]  lisinopril (PRINIVIL,ZESTRIL) 20 MG tablet Take 20 mg by mouth.    [provider]  nitroGLYCERIN (NITROSTAT) 0.4 MG SL tablet Place 1 tablet (0.4 mg total) under the tongue every 5 (five) minutes x 3 doses as needed for chest pain. 06/04/11 03/02/13  Rexene Alberts, MD  potassium chloride (K-DUR,KLOR-CON) 10 MEQ tablet Take 10 mEq by mouth 2 (two) times daily.    [provider]    Family History Family History  Problem Relation Age of Onset  . Heart attack Mother   . Hypertension Mother     Social History Social History   Tobacco Use  . Smoking status: Never Smoker  . Smokeless tobacco: Never Used  Substance Use Topics  . Alcohol use: No  . Drug use: No     Allergies   Patient has no known allergies.   Review of Systems Review of Systems  All other systems reviewed and are  negative.    Physical Exam Updated Vital Signs BP (!) 178/82 (BP Location: Left Arm)   Pulse 84   Temp 98.1 F (36.7 C) (Oral)   Resp 18   Ht 5\' 3"  (1.6 m)   Wt 70.3 kg (155 lb)   SpO2 100%   BMI 27.46 kg/m   Physical Exam  Constitutional: She is oriented to person, place, and time. She appears well-developed.  Frail, elderly  HENT:  Head: Normocephalic.  Small contusion with tenderness right zygoma region without deformity or significant skin injury.  No associated crepitation.  Eyes: Conjunctivae and EOM are normal. Pupils are equal, round, and reactive to light.  Neck: Normal range of motion and phonation normal. Neck supple.  Cardiovascular: Normal rate and regular rhythm.  Pulmonary/Chest:  Effort normal and breath sounds normal. She exhibits no tenderness.  Abdominal: Soft. She exhibits no distension. There is no tenderness. There is no guarding.  Musculoskeletal: Normal range of motion. She exhibits no deformity.  Mild right wrist tenderness without swelling or deformity.  Normal right wrist motion.  Normal motion of arms and legs bilaterally.  Normal gait.  Neurological: She is alert and oriented to person, place, and time. She exhibits normal muscle tone.  Skin: Skin is warm and dry.  Psychiatric: She has a normal mood and affect. Her behavior is normal. Judgment and thought content normal.  Nursing note and vitals reviewed.    ED Treatments / Results  Labs (all labs ordered are listed, but only abnormal results are displayed) Labs Reviewed - No data to display  EKG  EKG Interpretation None       Radiology Dg Wrist Complete Right  Result Date: 04/23/2017 CLINICAL DATA:  Pain EXAM: RIGHT WRIST - COMPLETE 3+ VIEW COMPARISON:  None. FINDINGS: There is no evidence of fracture or dislocation. There is no evidence of arthropathy or other focal bone abnormality. Soft tissues are unremarkable. IMPRESSION: Negative. Electronically Signed   By: Kerby Moors M.D.   On: 04/23/2017 10:28   Ct Head Wo Contrast  Result Date: 04/23/2017 CLINICAL DATA:  Fall. EXAM: CT HEAD WITHOUT CONTRAST CT MAXILLOFACIAL WITHOUT CONTRAST CT CERVICAL SPINE WITHOUT CONTRAST TECHNIQUE: Multidetector CT imaging of the head, cervical spine, and maxillofacial structures were performed using the standard protocol without intravenous contrast. Multiplanar CT image reconstructions of the cervical spine and maxillofacial structures were also generated. COMPARISON:  None. FINDINGS: CT HEAD FINDINGS Brain: No evidence of acute infarction, hemorrhage, hydrocephalus, extra-axial collection or mass lesion/mass effect. Moderate periventricular and subcortical white matter hypodensities are nonspecific, but likely  reflect chronic microvascular ischemic white matter disease. Vascular: Atherosclerotic calcifications.  No hyperdense vessel. Skull: Normal. Negative for fracture or focal lesion. Other: None. CT MAXILLOFACIAL FINDINGS Osseous: No fracture or mandibular dislocation. No destructive process. Orbits: Negative. No traumatic or inflammatory finding. Sinuses: Mucous retention cyst in the right maxillary sinus. The remaining paranasal sinuses and mastoid air cells are clear. Soft tissues: Negative. CT CERVICAL SPINE FINDINGS Alignment: Reversal of the normal cervical lordosis, centered at C5. Sagittal alignment is maintained. Skull base and vertebrae: No acute fracture. No primary bone lesion or focal pathologic process. Soft tissues and spinal canal: No prevertebral fluid or swelling. No visible canal hematoma. Disc levels: Mild to moderate disc height loss and uncovertebral hypertrophy from C4-C5 through C7-T1. Upper chest: The lung apices are clear. Markedly enlarged, heterogeneous thyroid gland extending superiorly to the level of C2. The left thyroid lobe measures up to 11.0 cm in craniocaudal dimension. Other: None.  IMPRESSION: 1. No acute intracranial abnormality. 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 2. No acute facial fracture. 3.  No acute cervical spine fracture. 4. Markedly enlarged, heterogeneous thyroid goiter. Consider further evaluation with thyroid ultrasound if not previously performed. Electronically Signed   By: Titus Dubin M.D.   On: 04/23/2017 10:49   Ct Cervical Spine Wo Contrast  Result Date: 04/23/2017 CLINICAL DATA:  Fall. EXAM: CT HEAD WITHOUT CONTRAST CT MAXILLOFACIAL WITHOUT CONTRAST CT CERVICAL SPINE WITHOUT CONTRAST TECHNIQUE: Multidetector CT imaging of the head, cervical spine, and maxillofacial structures were performed using the standard protocol without intravenous contrast. Multiplanar CT image reconstructions of the cervical spine and maxillofacial  structures were also generated. COMPARISON:  None. FINDINGS: CT HEAD FINDINGS Brain: No evidence of acute infarction, hemorrhage, hydrocephalus, extra-axial collection or mass lesion/mass effect. Moderate periventricular and subcortical white matter hypodensities are nonspecific, but likely reflect chronic microvascular ischemic white matter disease. Vascular: Atherosclerotic calcifications.  No hyperdense vessel. Skull: Normal. Negative for fracture or focal lesion. Other: None. CT MAXILLOFACIAL FINDINGS Osseous: No fracture or mandibular dislocation. No destructive process. Orbits: Negative. No traumatic or inflammatory finding. Sinuses: Mucous retention cyst in the right maxillary sinus. The remaining paranasal sinuses and mastoid air cells are clear. Soft tissues: Negative. CT CERVICAL SPINE FINDINGS Alignment: Reversal of the normal cervical lordosis, centered at C5. Sagittal alignment is maintained. Skull base and vertebrae: No acute fracture. No primary bone lesion or focal pathologic process. Soft tissues and spinal canal: No prevertebral fluid or swelling. No visible canal hematoma. Disc levels: Mild to moderate disc height loss and uncovertebral hypertrophy from C4-C5 through C7-T1. Upper chest: The lung apices are clear. Markedly enlarged, heterogeneous thyroid gland extending superiorly to the level of C2. The left thyroid lobe measures up to 11.0 cm in craniocaudal dimension. Other: None. IMPRESSION: 1. No acute intracranial abnormality. 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 2. No acute facial fracture. 3.  No acute cervical spine fracture. 4. Markedly enlarged, heterogeneous thyroid goiter. Consider further evaluation with thyroid ultrasound if not previously performed. Electronically Signed   By: Titus Dubin M.D.   On: 04/23/2017 10:49   Ct Maxillofacial Wo Cm  Result Date: 04/23/2017 CLINICAL DATA:  Fall. EXAM: CT HEAD WITHOUT CONTRAST CT MAXILLOFACIAL WITHOUT CONTRAST  CT CERVICAL SPINE WITHOUT CONTRAST TECHNIQUE: Multidetector CT imaging of the head, cervical spine, and maxillofacial structures were performed using the standard protocol without intravenous contrast. Multiplanar CT image reconstructions of the cervical spine and maxillofacial structures were also generated. COMPARISON:  None. FINDINGS: CT HEAD FINDINGS Brain: No evidence of acute infarction, hemorrhage, hydrocephalus, extra-axial collection or mass lesion/mass effect. Moderate periventricular and subcortical white matter hypodensities are nonspecific, but likely reflect chronic microvascular ischemic white matter disease. Vascular: Atherosclerotic calcifications.  No hyperdense vessel. Skull: Normal. Negative for fracture or focal lesion. Other: None. CT MAXILLOFACIAL FINDINGS Osseous: No fracture or mandibular dislocation. No destructive process. Orbits: Negative. No traumatic or inflammatory finding. Sinuses: Mucous retention cyst in the right maxillary sinus. The remaining paranasal sinuses and mastoid air cells are clear. Soft tissues: Negative. CT CERVICAL SPINE FINDINGS Alignment: Reversal of the normal cervical lordosis, centered at C5. Sagittal alignment is maintained. Skull base and vertebrae: No acute fracture. No primary bone lesion or focal pathologic process. Soft tissues and spinal canal: No prevertebral fluid or swelling. No visible canal hematoma. Disc levels: Mild to moderate disc height loss and uncovertebral hypertrophy from C4-C5 through C7-T1. Upper chest: The lung apices are clear. Markedly enlarged, heterogeneous  thyroid gland extending superiorly to the level of C2. The left thyroid lobe measures up to 11.0 cm in craniocaudal dimension. Other: None. IMPRESSION: 1. No acute intracranial abnormality. 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 2. No acute facial fracture. 3.  No acute cervical spine fracture. 4. Markedly enlarged, heterogeneous thyroid goiter. Consider  further evaluation with thyroid ultrasound if not previously performed. Electronically Signed   By: Titus Dubin M.D.   On: 04/23/2017 10:49    Procedures Procedures (including critical care time)  Medications Ordered in ED Medications  acetaminophen (TYLENOL) tablet 650 mg (650 mg Oral Given 04/23/17 0959)     Initial Impression / Assessment and Plan / ED Course  I have reviewed the triage vital signs and the nursing notes.  Pertinent labs & imaging results that were available during my care of the patient were reviewed by me and considered in my medical decision making (see chart for details).  Clinical Course as of Apr 23 1748  Wed Apr 23, 2017  1209 DG Wrist Complete Right [EW]  1209 No fractures to head face or neck CT Head Wo Contrast [EW]  1209 No fracture DG Wrist Complete Right [EW]    Clinical Course User Index [EW] Daleen Bo, MD     Patient Vitals for the past 24 hrs:  BP Temp Temp src Pulse Resp SpO2 Height Weight  04/23/17 1243 (!) 178/82 98.1 F (36.7 C) Oral 84 18 100 % - -  04/23/17 0938 - - - - - - 5\' 3"  (1.6 m) 70.3 kg (155 lb)  04/23/17 0937 (!) 183/105 98.5 F (36.9 C) Oral 100 17 100 % - -    12:07 PM Reevaluation with update and discussion. After initial assessment and treatment, an updated evaluation reveals no change in clinical status.  Findings discussed with the patient and all questions were answered. Daleen Bo      Final Clinical Impressions(s) / ED Diagnoses   Final diagnoses:  Contusion, multiple sites   Contusion without severe injury.  The fall was mechanical in origin.  Nursing Notes Reviewed/ Care Coordinated Applicable Imaging Reviewed Interpretation of Laboratory Data incorporated into ED treatment  The patient appears reasonably screened and/or stabilized for discharge and I doubt any other medical condition or other [facility] requiring further screening, evaluation, or treatment in the ED at this time prior to  discharge.  Plan: Home Medications-OTC analgesia as needed; Home Treatments-rest, cryotherapy; return here if the recommended treatment, does not improve the symptoms; Recommended follow up-P, PRN   ED Discharge Orders    None       Daleen Bo, MD 04/23/17 1751

## 2017-04-23 NOTE — Discharge Instructions (Signed)
Use ice on the sore spots today and tomorrow.  After that use heat for pain relief.  Use Tylenol every 4 hours if needed to help control the pain.

## 2017-06-24 ENCOUNTER — Other Ambulatory Visit: Payer: Self-pay

## 2017-06-24 ENCOUNTER — Inpatient Hospital Stay (HOSPITAL_COMMUNITY)
Admission: EM | Admit: 2017-06-24 | Discharge: 2017-06-27 | DRG: 247 | Disposition: A | Discharge status: Home Health | Payer: Medicare HMO | Attending: Internal Medicine | Admitting: Internal Medicine

## 2017-06-24 ENCOUNTER — Emergency Department (HOSPITAL_COMMUNITY): Payer: Medicare HMO

## 2017-06-24 ENCOUNTER — Encounter (HOSPITAL_COMMUNITY): Payer: Self-pay | Admitting: *Deleted

## 2017-06-24 DIAGNOSIS — Z7982 Long term (current) use of aspirin: Secondary | ICD-10-CM | POA: Diagnosis not present

## 2017-06-24 DIAGNOSIS — I10 Essential (primary) hypertension: Secondary | ICD-10-CM | POA: Diagnosis present

## 2017-06-24 DIAGNOSIS — I2510 Atherosclerotic heart disease of native coronary artery without angina pectoris: Secondary | ICD-10-CM

## 2017-06-24 DIAGNOSIS — Z8249 Family history of ischemic heart disease and other diseases of the circulatory system: Secondary | ICD-10-CM

## 2017-06-24 DIAGNOSIS — N289 Disorder of kidney and ureter, unspecified: Secondary | ICD-10-CM | POA: Diagnosis not present

## 2017-06-24 DIAGNOSIS — N183 Chronic kidney disease, stage 3 unspecified: Secondary | ICD-10-CM | POA: Diagnosis present

## 2017-06-24 DIAGNOSIS — N189 Chronic kidney disease, unspecified: Secondary | ICD-10-CM

## 2017-06-24 DIAGNOSIS — E876 Hypokalemia: Secondary | ICD-10-CM | POA: Diagnosis present

## 2017-06-24 DIAGNOSIS — I214 Non-ST elevation (NSTEMI) myocardial infarction: Secondary | ICD-10-CM | POA: Diagnosis present

## 2017-06-24 DIAGNOSIS — E785 Hyperlipidemia, unspecified: Secondary | ICD-10-CM | POA: Diagnosis present

## 2017-06-24 DIAGNOSIS — Z23 Encounter for immunization: Secondary | ICD-10-CM

## 2017-06-24 DIAGNOSIS — R7303 Prediabetes: Secondary | ICD-10-CM

## 2017-06-24 DIAGNOSIS — Z951 Presence of aortocoronary bypass graft: Secondary | ICD-10-CM

## 2017-06-24 DIAGNOSIS — I2582 Chronic total occlusion of coronary artery: Secondary | ICD-10-CM | POA: Diagnosis present

## 2017-06-24 DIAGNOSIS — I129 Hypertensive chronic kidney disease with stage 1 through stage 4 chronic kidney disease, or unspecified chronic kidney disease: Secondary | ICD-10-CM | POA: Diagnosis present

## 2017-06-24 DIAGNOSIS — E1122 Type 2 diabetes mellitus with diabetic chronic kidney disease: Secondary | ICD-10-CM | POA: Diagnosis present

## 2017-06-24 DIAGNOSIS — Z9861 Coronary angioplasty status: Secondary | ICD-10-CM | POA: Diagnosis not present

## 2017-06-24 DIAGNOSIS — Z7984 Long term (current) use of oral hypoglycemic drugs: Secondary | ICD-10-CM

## 2017-06-24 DIAGNOSIS — I25810 Atherosclerosis of coronary artery bypass graft(s) without angina pectoris: Secondary | ICD-10-CM | POA: Diagnosis present

## 2017-06-24 DIAGNOSIS — N179 Acute kidney failure, unspecified: Secondary | ICD-10-CM | POA: Diagnosis present

## 2017-06-24 DIAGNOSIS — E119 Type 2 diabetes mellitus without complications: Secondary | ICD-10-CM

## 2017-06-24 DIAGNOSIS — Z8639 Personal history of other endocrine, nutritional and metabolic disease: Secondary | ICD-10-CM

## 2017-06-24 HISTORY — DX: Non-ST elevation (NSTEMI) myocardial infarction: I21.4

## 2017-06-24 LAB — CBC
HEMATOCRIT: 41.6 % (ref 36.0–46.0)
HEMOGLOBIN: 14.1 g/dL (ref 12.0–15.0)
MCH: 30.5 pg (ref 26.0–34.0)
MCHC: 33.9 g/dL (ref 30.0–36.0)
MCV: 90 fL (ref 78.0–100.0)
Platelets: 206 10*3/uL (ref 150–400)
RBC: 4.62 MIL/uL (ref 3.87–5.11)
RDW: 12.9 % (ref 11.5–15.5)
WBC: 6.1 10*3/uL (ref 4.0–10.5)

## 2017-06-24 LAB — BASIC METABOLIC PANEL
ANION GAP: 13 (ref 5–15)
BUN: 31 mg/dL — AB (ref 6–20)
CO2: 25 mmol/L (ref 22–32)
Calcium: 9.5 mg/dL (ref 8.9–10.3)
Chloride: 102 mmol/L (ref 101–111)
Creatinine, Ser: 1.88 mg/dL — ABNORMAL HIGH (ref 0.44–1.00)
GFR calc Af Amer: 29 mL/min — ABNORMAL LOW (ref >60)
GFR, EST NON AFRICAN AMERICAN: 25 mL/min — AB (ref >60)
GLUCOSE: 154 mg/dL — AB (ref 65–99)
POTASSIUM: 3.2 mmol/L — AB (ref 3.5–5.1)
Sodium: 140 mmol/L (ref 135–145)

## 2017-06-24 LAB — TROPONIN I
TROPONIN I: 1.67 ng/mL — AB (ref <0.03)
Troponin I: 0.08 ng/mL (ref <0.03)
Troponin I: 0.31 ng/mL (ref <0.03)
Troponin I: 2.74 ng/mL (ref <0.03)

## 2017-06-24 LAB — APTT: aPTT: 27 seconds (ref 24–36)

## 2017-06-24 LAB — MAGNESIUM: Magnesium: 2.2 mg/dL (ref 1.7–2.4)

## 2017-06-24 LAB — HEPARIN LEVEL (UNFRACTIONATED): HEPARIN UNFRACTIONATED: 0.84 [IU]/mL — AB (ref 0.30–0.70)

## 2017-06-24 IMAGING — DX DG CHEST 2V
2 series · 2 of 2 positions shown · non-contrast
Comparison: [DATE]

CLINICAL DATA: Left-sided chest pain.

EXAM:
CHEST - 2 VIEW

[chest lat]
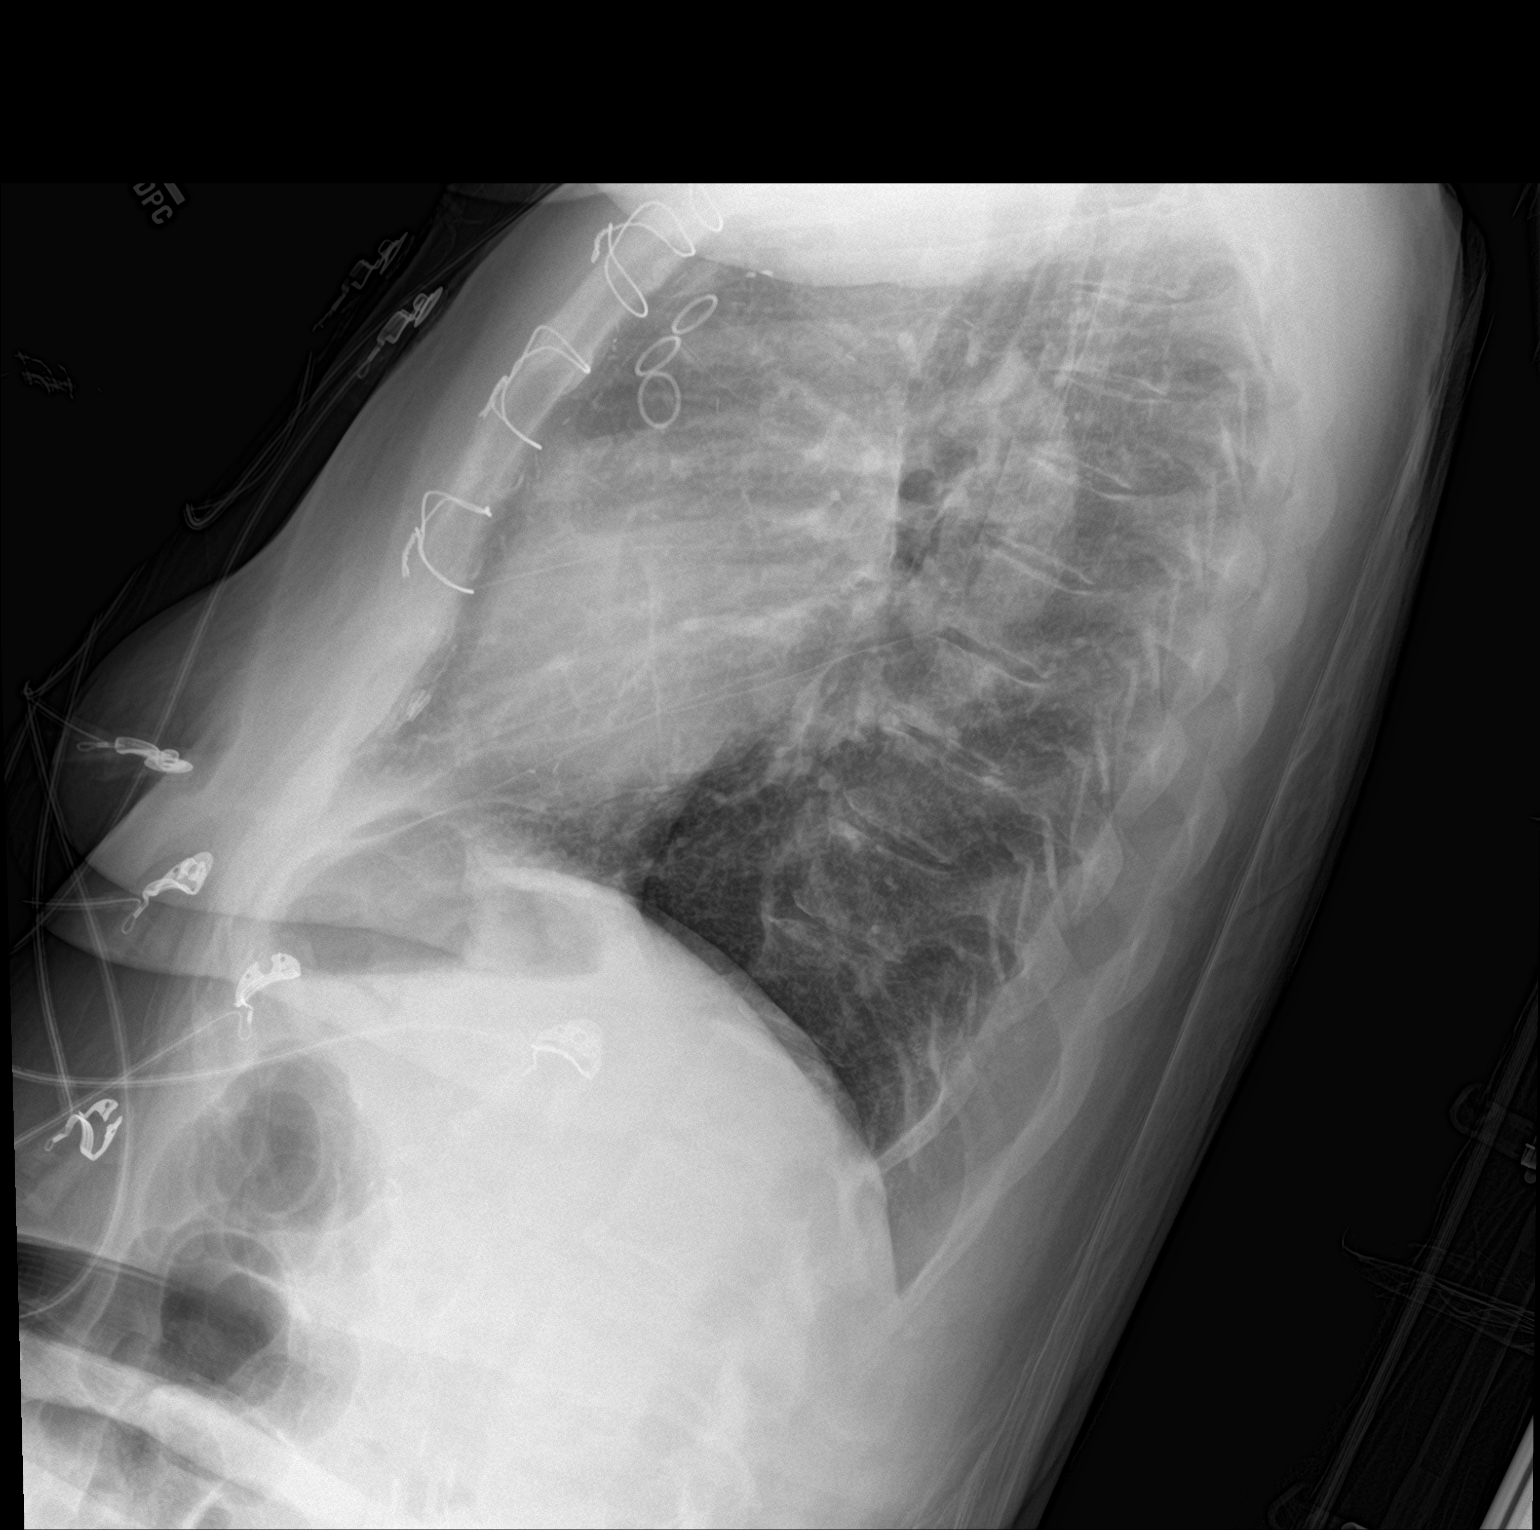

[chest ap]
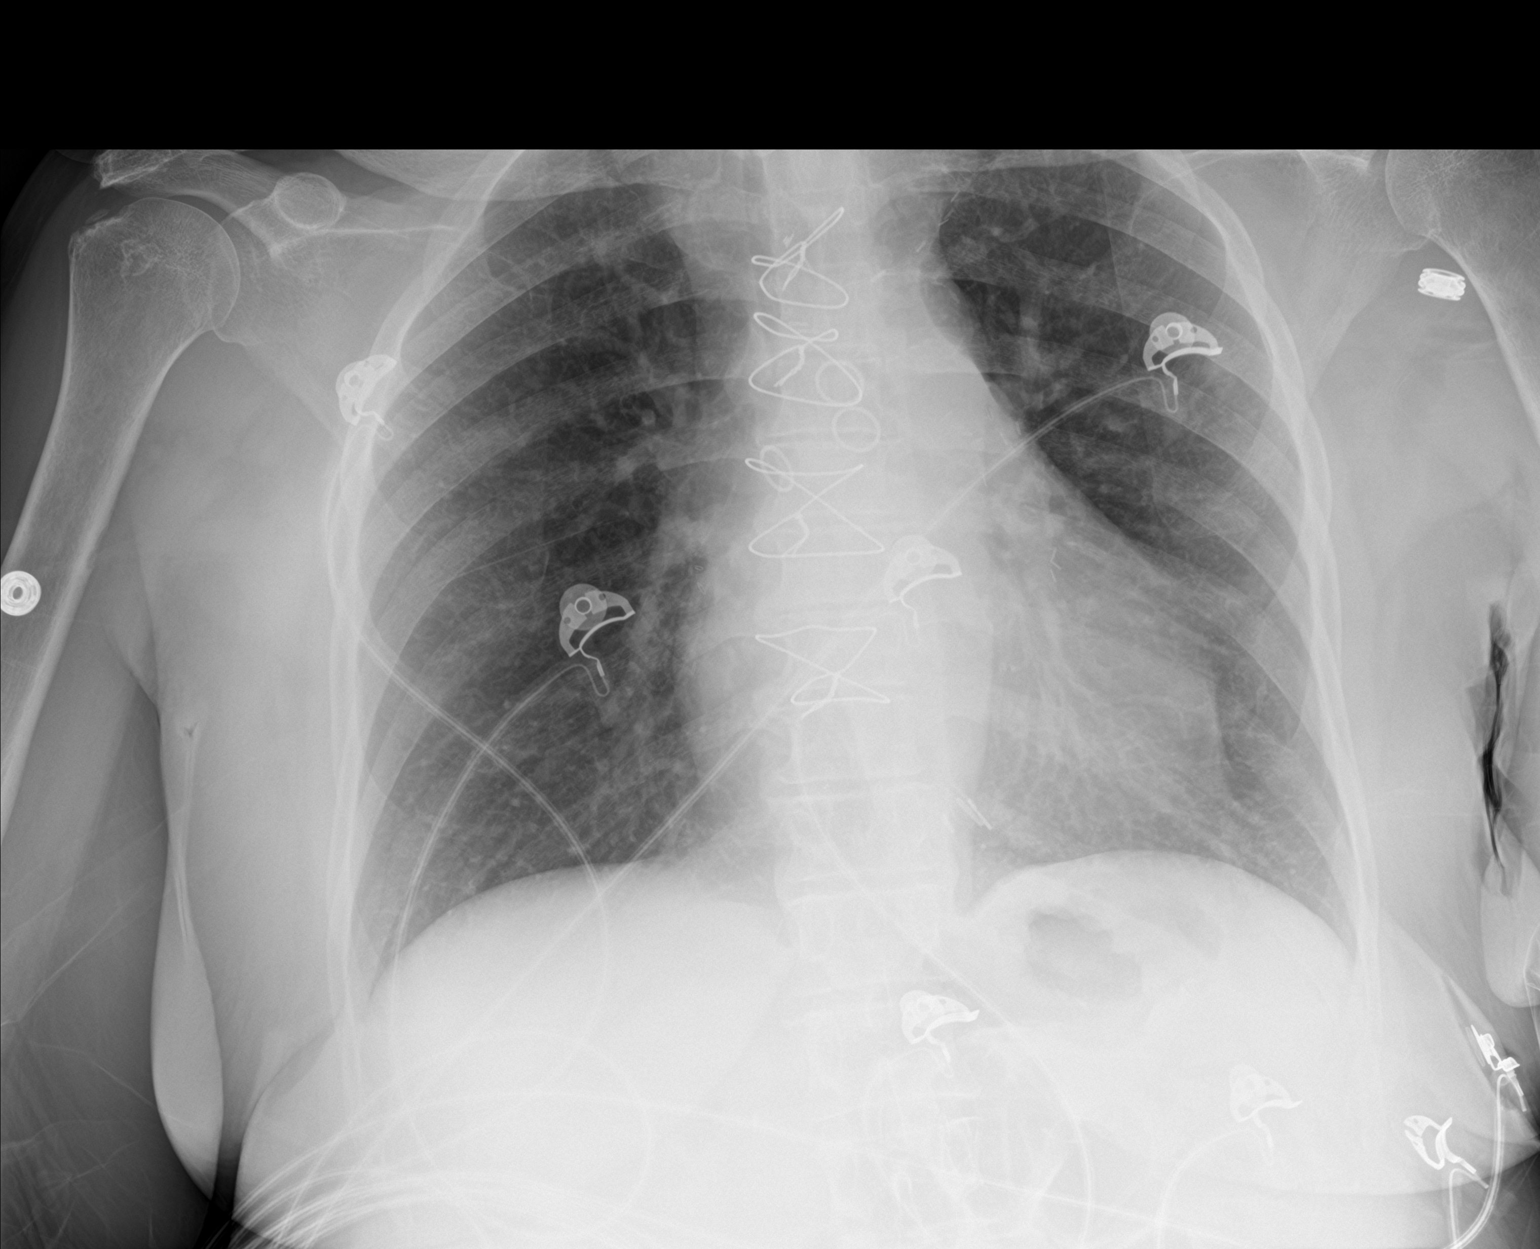

[2 of 2 positions shown; findings below may reference images not displayed]

FINDINGS: Post median sternotomy.The cardiomediastinal contours are unchanged,
heart size is normal. The lungs are clear. Pulmonary vasculature is
normal. No consolidation, pleural effusion, or pneumothorax. No
acute osseous abnormalities are seen.
IMPRESSION: No acute pulmonary process.

## 2017-06-24 MED ORDER — NITROGLYCERIN 0.4 MG SL SUBL: 0.4000 mg | SUBLINGUAL_TABLET | SUBLINGUAL | Status: DC | PRN

## 2017-06-24 MED ORDER — HEPARIN BOLUS VIA INFUSION
4000.0000 [IU] | Freq: Once | INTRAVENOUS | Status: AC
Start: 2017-06-24 — End: 2017-06-24
  Administered 2017-06-24: 4000 [IU] via INTRAVENOUS

## 2017-06-24 MED ORDER — SODIUM CHLORIDE 0.9 % IV SOLN: 250.0000 mL | INTRAVENOUS | Status: DC | PRN

## 2017-06-24 MED ORDER — ATORVASTATIN CALCIUM 40 MG PO TABS
40.0000 mg | ORAL_TABLET | Freq: Every day | ORAL | Status: DC
  Administered 2017-06-25 – 2017-06-26 (×2): 40 mg via ORAL
  Filled 2017-06-24 (×4): qty 1

## 2017-06-24 MED ORDER — AMLODIPINE BESYLATE 10 MG PO TABS
10.0000 mg | ORAL_TABLET | Freq: Every day | ORAL | Status: DC
  Administered 2017-06-24 – 2017-06-27 (×4): 10 mg via ORAL
  Filled 2017-06-24: qty 2
  Filled 2017-06-24 (×3): qty 1

## 2017-06-24 MED ORDER — SODIUM CHLORIDE 0.9% FLUSH: 3.0000 mL | INTRAVENOUS | Status: DC | PRN

## 2017-06-24 MED ORDER — HEPARIN (PORCINE) IN NACL 100-0.45 UNIT/ML-% IJ SOLN
800.0000 [IU]/h | INTRAMUSCULAR | Status: DC
  Administered 2017-06-24: 850 [IU]/h via INTRAVENOUS
  Filled 2017-06-24: qty 250

## 2017-06-24 MED ORDER — ACETAMINOPHEN 325 MG PO TABS
650.0000 mg | ORAL_TABLET | ORAL | Status: DC | PRN
Start: 2017-06-24 — End: 2017-06-25

## 2017-06-24 MED ORDER — INFLUENZA VAC SPLIT HIGH-DOSE 0.5 ML IM SUSY
0.5000 mL | PREFILLED_SYRINGE | INTRAMUSCULAR | Status: AC
  Administered 2017-06-27: 14:00:00 0.5 mL via INTRAMUSCULAR
  Filled 2017-06-24 (×2): qty 0.5

## 2017-06-24 MED ORDER — SODIUM CHLORIDE 0.9% FLUSH
3.0000 mL | Freq: Two times a day (BID) | INTRAVENOUS | Status: DC
  Administered 2017-06-24: 3 mL via INTRAVENOUS

## 2017-06-24 MED ORDER — ASPIRIN 81 MG PO CHEW
81.0000 mg | CHEWABLE_TABLET | ORAL | Status: AC
  Administered 2017-06-25: 81 mg via ORAL
  Filled 2017-06-24: qty 1

## 2017-06-24 MED ORDER — SODIUM CHLORIDE 0.9 % IV SOLN
INTRAVENOUS | Status: DC
  Administered 2017-06-24: 20:00:00 via INTRAVENOUS

## 2017-06-24 MED ORDER — ASPIRIN 81 MG PO CHEW
81.0000 mg | CHEWABLE_TABLET | Freq: Every day | ORAL | Status: DC
  Administered 2017-06-24: 81 mg via ORAL
  Filled 2017-06-24 (×2): qty 1

## 2017-06-24 MED ORDER — PNEUMOCOCCAL VAC POLYVALENT 25 MCG/0.5ML IJ INJ
0.5000 mL | INJECTION | INTRAMUSCULAR | Status: AC
  Administered 2017-06-27: 0.5 mL via INTRAMUSCULAR
  Filled 2017-06-24: qty 0.5

## 2017-06-24 MED ORDER — ONDANSETRON HCL 4 MG/2ML IJ SOLN: 4.0000 mg | Freq: Four times a day (QID) | INTRAMUSCULAR | Status: DC | PRN

## 2017-06-24 MED ORDER — SODIUM CHLORIDE 0.9 % IV SOLN
INTRAVENOUS | Status: DC
  Administered 2017-06-24: 09:00:00 via INTRAVENOUS

## 2017-06-24 NOTE — Progress Notes (Signed)
ANTICOAGULATION CONSULT NOTE - Preliminary  Pharmacy Consult for Heparin Indication:ACS/STEMI   No Known Allergies  Patient Measurements: Height: 5\' 4"  (162.6 cm) Weight: 155 lb (70.3 kg) IBW/kg (Calculated) : 54.7 HEPARIN DW (KG): 69   Vital Signs: Temp: 97.9 F (36.6 C) (03/19 0322) Temp Source: Oral (03/19 0322) BP: 137/90 (03/19 0700) Pulse Rate: 74 (03/19 0700)  Labs: Recent Labs    06/24/17 0320 06/24/17 0321 06/24/17 0622  HGB  --  14.1  --   HCT  --  41.6  --   PLT  --  206  --   CREATININE  --  1.88*  --   TROPONINI 0.08*  --  0.31*   Estimated Creatinine Clearance: 25.6 mL/min (A) (by C-G formula based on SCr of 1.88 mg/dL (H)).  Medical History: Past Medical History:  Diagnosis Date  . Arthritis   . Chronic kidney disease, stage 3, mod decreased GFR (HCC)   . Coronary atherosclerosis of native coronary artery 2006   Multivessel status post CABG in [place]  . Diabetes mellitus, type 2 ([place])   . Essential hypertension, benign   . 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 Hyperlipidemia     Medications:    Assessment: 72 yo female with hx of CAD s/p CABG seen in the Ed for chest pain. Troponin is rising.Cardiology consulted. Pharmacy has been consulted for heparin dosing.  Goal of Therapy:  Heparin level goal: 0.3-0.7 units/ml Monitor platelets by anticoagulation protocol: Yes   Plan:  Heparin bolus 4000 units IV Heparin infusion at 850 units/hr 8 hour heparin level  Preliminary review of pertinent patient information completed.  Forestine Na clinical pharmacist will complete review during morning rounds to assess the patient and finalize treatment regimen.  Norberto Sorenson, [facility] 06/24/2017,7:31 AM

## 2017-06-24 NOTE — ED Notes (Signed)
Paged Dr. Debara Pickett about pt.'s increasing confusion and aggression

## 2017-06-24 NOTE — Progress Notes (Signed)
ANTICOAGULATION CONSULT NOTE - follow up  Pharmacy Consult for Heparin Indication:ACS/STEMI   No Known Allergies  Patient Measurements: Height: 5\' 4"  (162.6 cm) Weight: 155 lb (70.3 kg) IBW/kg (Calculated) : 54.7 HEPARIN DW (KG): 69   Vital Signs: BP: 139/69 (03/19 1625) Pulse Rate: 91 (03/19 1625)  Labs: Recent Labs    06/24/17 0321 06/24/17 0622 06/24/17 1126 06/24/17 1617  HGB 14.1  --   --   --   HCT 41.6  --   --   --   PLT 206  --   --   --   APTT 27  --   --   --   HEPARINUNFRC  --   --   --  0.84*  CREATININE 1.88*  --   --   --   TROPONINI  --  0.31* 1.67* 2.74*   Estimated Creatinine Clearance: 25.6 mL/min (A) (by C-G formula based on SCr of 1.88 mg/dL (H)).  Medical History: Past Medical History:  Diagnosis Date  . Arthritis   . Chronic kidney disease, stage 3, mod decreased GFR (HCC)   . Coronary atherosclerosis of native coronary artery 2006   Multivessel status post CABG in [place]  . Diabetes mellitus, type 2 ([place])   . Essential hypertension, benign   . 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 Hyperlipidemia     Medications:    Assessment: 72 yo female with hx of CAD s/p CABG seen in the Ed for chest pain. Troponin is rising.Cardiology consulted. Pharmacy has been consulted for heparin dosing. Heparin level above goal.  Goal of Therapy:  Heparin level goal: 0.3-0.7 units/ml Monitor platelets by anticoagulation protocol: Yes   Plan:  Decrease Heparin infusion to 700 units/hr Check anti-Xa level in 6-8 hours and  daily while on heparin Continue to monitor H&H and platelets  Isac Sarna, BS Vena Austria, BCPS Clinical Pharmacist Pager [phone] 06/24/2017,5:01 PM

## 2017-06-24 NOTE — H&P (Signed)
History & Physical    Patient ID: Erin Jordan MRN: [id number], DOB/AGE: 1945/04/22   Admit date: 06/24/2017  Primary Care Provider: Patient, No Pcp Per Primary Cardiologist: Domenic Polite  Primary Electrophysiologist:  None   Chief Complaint:  Chest Pain   Patient Profile    Erin Jordan is a 72 y.o. female with past medical history of CABG [place] 2006 seen in ER for chest pain and positive troponin  History of Present Illness    Erin Jordan 72 y.o. followed at [facility] by Dr Domenic Polite. Had CABG 2006 in [place] graft report not available. Last myovue non ischemic 2013 has had normal EF in past. Also with history of HTN, CRF stage 3 Awoke from sleep with SSCP. Some relief with heparin and nitro in ER. Troponin elevated .31 Apparently [facility] contacted and patient accepted by Dr Debara Pickett for transfer and likely heart catheterization Patient is pain free now no acute ECG changes. No dyspnea, arrhythmia syncope Compliant with meds Urinating well no contrast allergy       Past Medical History:  Diagnosis Date  . Arthritis   . Chronic kidney disease, stage 3, mod decreased GFR (HCC)   . Coronary atherosclerosis of native coronary artery 2006   Multivessel status post CABG in [place]  . Diabetes mellitus, type 2 ([place])   . Essential hypertension, benign   . 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 Hyperlipidemia     Past Surgical History:  Procedure Laterality Date  . ABDOMINAL HYSTERECTOMY    . CORONARY ARTERY BYPASS GRAFT  2006   [place], [place]     Medications Prior to Admission: Prior to Admission medications   Medication Sig Start Date End Date Taking? Authorizing Provider  amLODipine (NORVASC) 10 MG tablet TAKE 1 TABLET BY MOUTH ONCE A DAY. 12/25/12  Yes Lendon Colonel, NP  aspirin 81 MG tablet Take 81 mg by mouth daily.   Yes [provider]  atorvastatin (LIPITOR) 40 MG tablet Take 1 tablet (40 mg total) by mouth daily at 6 PM. FOR HIGH  CHOLESTEROL. 06/04/11 06/24/17 Yes Rexene Alberts, MD  glipiZIDE (GLUCOTROL) 10 MG tablet Take 10 mg by mouth daily.    [provider]  hydrochlorothiazide (MICROZIDE) 12.5 MG capsule Take 1 capsule (12.5 mg total) by mouth daily. 03/02/13   Satira Sark, MD  isosorbide dinitrate (ISORDIL) 30 MG tablet Take 30 mg by mouth every morning.    [provider]  lisinopril (PRINIVIL,ZESTRIL) 20 MG tablet Take 20 mg by mouth.    [provider]  nitroGLYCERIN (NITROSTAT) 0.4 MG SL tablet Place 1 tablet (0.4 mg total) under the tongue every 5 (five) minutes x 3 doses as needed for chest pain. 06/04/11 03/02/13  Rexene Alberts, MD  potassium chloride (K-DUR,KLOR-CON) 10 MEQ tablet Take 10 mEq by mouth 2 (two) times daily.    [provider]     Allergies:   No Known Allergies  Social History:   Social History   Socioeconomic History  . Marital status: Widowed    Spouse name: Not on file  . Number of children: Not on file  . Years of education: Not on file  . Highest education level: Not on file  Social Needs  . Financial resource strain: Not on file  . Food insecurity - worry: Not on file  . Food insecurity - inability: Not on file  . Transportation needs - medical: Not on file  . Transportation needs - non-medical: Not  on file  Occupational History  . Not on file  Tobacco Use  . Smoking status: Never Smoker  . Smokeless tobacco: Never Used  Substance and Sexual Activity  . Alcohol use: No  . Drug use: No  . Sexual activity: No  Other Topics Concern  . Not on file  Social History Narrative   Lives in [place] with her daughter and grandchildren.      Family History:   The patient's family history includes Heart attack in her mother; Hypertension in her mother.     Review of Systems    General:  No chills, fever, night sweats or weight changes.  Cardiovascular:  No chest pain, dyspnea on exertion, edema, orthopnea, palpitations,  paroxysmal nocturnal dyspnea. Dermatological: No rash, lesions/masses Respiratory: No cough, dyspnea Urologic: No hematuria, dysuria Abdominal:   No nausea, vomiting, diarrhea, bright red blood per rectum, melena, or hematemesis Neurologic:  No visual changes, wkns, changes in mental status. All other systems reviewed and are otherwise negative except as noted above.  Physical Exam    Vitals:   06/24/17 0700 06/24/17 0742 06/24/17 0800 06/24/17 0930  BP: 137/90 (!) 183/95 (!) 169/88 (!) 157/67  Pulse: 74 74 74   Resp: 14 18 13 16   Temp:      TempSrc:      SpO2: 100% 100% 95%   Weight:      Height:       No intake or output data in the 24 hours ending 06/24/17 0951 Filed Weights   06/24/17 0312  Weight: 155 lb (70.3 kg)   Body mass index is 26.61 kg/m.   General: Well developed, well nourished,female in no acute distress. Head: Normocephalic, atraumatic, sclera non-icteric, no xanthomas, nares are without discharge. Dentition:  Neck: No carotid bruits. JVD not elevated.  Lungs: Respirations regular and unlabored, without wheezes or rales.  Heart: Regular rate and rhythm. No S3 or S4.  No murmur, no rubs, or gallops appreciated. Abdomen: Soft, non-tender, non-distended with normoactive bowel sounds. No hepatomegaly. No rebound/guarding. No obvious abdominal masses. Msk:  Strength and tone appear normal for age. No joint deformities or effusions. Extremities: No clubbing or cyanosis. No edema.  Distal pedal pulses are 2+ bilaterally. Neuro: Alert and oriented X 3. Moves all extremities spontaneously. No focal deficits noted. Psych:  Responds to questions appropriately with a normal affect. Skin: No rashes or lesions noted  Labs and Radiology Studies    EKG:  The ECG that was done 06/24/17 was personally reviewed and demonstrates SR ? Old IMI no acute ST changes   Relevant CV Studies: None   Laboratory Data:  Chemistry Recent Labs  Lab 06/24/17 0321  NA 140  K 3.2*    CL 102  CO2 25  GLUCOSE 154*  BUN 31*  CREATININE 1.88*  CALCIUM 9.5  GFRNONAA 25*  GFRAA 29*  ANIONGAP 13    No results for input(s): PROT, ALBUMIN, AST, ALT, ALKPHOS, BILITOT in the last 168 hours. Hematology Recent Labs  Lab 06/24/17 0321  WBC 6.1  RBC 4.62  HGB 14.1  HCT 41.6  MCV 90.0  MCH 30.5  MCHC 33.9  RDW 12.9  PLT 206   Cardiac Enzymes Recent Labs  Lab 06/24/17 0320 06/24/17 0622  TROPONINI 0.08* 0.31*   No results for input(s): TROPIPOC in the last 168 hours.  BNPNo results for input(s): BNP, PROBNP in the last 168 hours.  DDimer No results for input(s): DDIMER in the last 168 hours.  Radiology/Studies:  Dg Chest 2 View  Result Date: 06/24/2017 CLINICAL DATA:  Left-sided chest pain. EXAM: CHEST - 2 VIEW COMPARISON:  06/01/2011 FINDINGS: Post median sternotomy.The cardiomediastinal contours are unchanged, heart size is normal. The lungs are clear. Pulmonary vasculature is normal. No consolidation, pleural effusion, or pneumothorax. No acute osseous abnormalities are seen. IMPRESSION: No acute pulmonary process. Electronically Signed   By: Jeb Levering M.D.   On: 06/24/2017 04:23    Assessment and Plan:   1. Chest Pain: Distant CABG 2006 with elevated troponin Pain free now. Continue heparin transfer to [facility]. Would hydrate for Cr 1. 8 and get graft records from [facility] to minimize dye cath in am not today  2. CRF:  Hydrate before cath stable  3. HTN:  Well controlled.  Continue current medications and low sodium Dash type diet.   4. DM:  Discussed low carb diet.  Target hemoglobin A1c is 6.5 or less.  Continue current medications.  Erin Jordan

## 2017-06-24 NOTE — ED Notes (Signed)
[phone]  Phone Numbers of Family Members

## 2017-06-24 NOTE — ED Triage Notes (Signed)
Pt brought in by [facility] for c/o chest pain that started around 230 this am; pt rates pain 10/10 and woke her up from sleep; pt received 325mg  of aspirin with ems en route; pt states the pain is to the left side of her chest and doesn't radiate

## 2017-06-24 NOTE — ED Notes (Signed)
Date and time results received: 06/24/17 0701 (use smartphrase ".now" to insert current time)  Test: trop Critical Value: 0.31  Name of Provider Notified: dr Stark Jock  Orders Received? Or Actions Taken?: no new orders

## 2017-06-24 NOTE — ED Notes (Signed)
CRITICAL VALUE ALERT  Critical Value:  Troponin 1.67  Date & Time Notied:  06-24-17 1255  Provider Notified: Wentz/Zacowski,EDP then Dr. Harl Bowie   Orders Received/Actions taken: continue to monitor.

## 2017-06-24 NOTE — ED Notes (Signed)
Have given pt a meal tray  

## 2017-06-24 NOTE — ED Notes (Signed)
Gave repeat EKG to Dr. Eulis Foster

## 2017-06-24 NOTE — ED Notes (Signed)
[place]

## 2017-06-24 NOTE — ED Notes (Signed)
CRITICAL VALUE ALERT  Critical Value:  2.74 Troponin   Date & Time Notied:  06/24/17 1600  Provider Notified: Notified MD   Orders Received/Actions taken: Notified MD

## 2017-06-24 NOTE — ED Provider Notes (Addendum)
[facility] EMERGENCY DEPARTMENT Provider Note   CSN: [id number] Arrival date & time: 06/24/17  0307     History   Chief Complaint Chief Complaint  Patient presents with  . Chest Pain    HPI ASHLLEY Jordan is a 72 y.o. female.  Patient is a 72 year old female with past medical history of chronic renal insufficiency, coronary artery disease with CABG, hypertension, diabetes.  She presents today complaining of chest pain.  She states that this woke her from sleep at approximately 230 this morning.  She described it as a pressure in her chest with no associated dyspnea, nausea, diaphoresis, or radiation.  Vision is somewhat a difficult historian.  The details of this event are somewhat vague as the patient has difficulty expressing herself.  In her medical record, I see mention of multivessel CABG performed in [place], however the patient is not sure as to whether or not she has had this surgery done.  She does appear to have a scar to her chest.   The history is provided by the patient.  Chest Pain   This is a new problem. Episode onset: 2:30 AM. The problem occurs constantly. The problem has been resolved. The pain is present in the substernal region. The pain is moderate. The pain does not radiate.    Past Medical History:  Diagnosis Date  . Arthritis   . Chronic kidney disease, stage 3, mod decreased GFR (HCC)   . Coronary atherosclerosis of native coronary artery 2006   Multivessel status post CABG in [place]  . Diabetes mellitus, type 2 ([place])   . Essential hypertension, benign   . 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 Hyperlipidemia     Patient Active Problem List   Diagnosis Date Noted  . 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 Coronary atherosclerosis of native coronary artery   . Diabetes mellitus, type 2 ([place])   . Essential hypertension, benign   . Chronic kidney disease, stage 3, mod decreased GFR (HCC)   . Obesity 06/02/2011  . Hyperlipidemia  06/02/2011    Past Surgical History:  Procedure Laterality Date  . ABDOMINAL HYSTERECTOMY    . CORONARY ARTERY BYPASS GRAFT  2006   [place], [place]    OB History    No data available       Home Medications    Prior to Admission medications   Medication Sig Start Date End Date Taking? Authorizing Provider  amLODipine (NORVASC) 10 MG tablet TAKE 1 TABLET BY MOUTH ONCE A DAY. 12/25/12   Lendon Colonel, NP  aspirin 81 MG tablet Take 81 mg by mouth daily.    [provider]  atorvastatin (LIPITOR) 40 MG tablet Take 1 tablet (40 mg total) by mouth daily at 6 PM. FOR HIGH CHOLESTEROL. 06/04/11 03/02/13  Rexene Alberts, MD  glipiZIDE (GLUCOTROL) 10 MG tablet Take 10 mg by mouth daily.    [provider]  hydrochlorothiazide (MICROZIDE) 12.5 MG capsule Take 1 capsule (12.5 mg total) by mouth daily. 03/02/13   Satira Sark, MD  isosorbide dinitrate (ISORDIL) 30 MG tablet Take 30 mg by mouth every morning.    [provider]  lisinopril (PRINIVIL,ZESTRIL) 20 MG tablet Take 20 mg by mouth.    [provider]  nitroGLYCERIN (NITROSTAT) 0.4 MG SL tablet Place 1 tablet (0.4 mg total) under the tongue every 5 (five) minutes x 3 doses as needed for chest pain. 06/04/11 03/02/13  Rexene Alberts, MD  potassium chloride (K-DUR,KLOR-CON) 10  MEQ tablet Take 10 mEq by mouth 2 (two) times daily.    [provider]    Family History Family History  Problem Relation Age of Onset  . Heart attack Mother   . Hypertension Mother     Social History Social History   Tobacco Use  . Smoking status: Never Smoker  . Smokeless tobacco: Never Used  Substance Use Topics  . Alcohol use: No  . Drug use: No     Allergies   Patient has no known allergies.   Review of Systems Review of Systems  Cardiovascular: Positive for chest pain.  All other systems reviewed and are negative.    Physical Exam Updated Vital Signs BP (!) 190/91 (BP  Location: Left Arm)   Pulse 77   Temp 97.9 F (36.6 C) (Oral)   Resp 14   Ht 5\' 4"  (1.626 m)   Wt 70.3 kg (155 lb)   SpO2 100%   BMI 26.61 kg/m   Physical Exam   ED Treatments / Results  Labs (all labs ordered are listed, but only abnormal results are displayed) Labs Reviewed  CBC  BASIC METABOLIC PANEL  I-STAT TROPONIN, ED    EKG  EKG Interpretation  Date/Time:  Tuesday June 24 2017 03:18:37 EDT Ventricular Rate:  78 PR Interval:    QRS Duration: 101 QT Interval:  390 QTC Calculation: 445 R Axis:   61 Text Interpretation:  Sinus rhythm Nonspecific T abnormalities, lateral leads Baseline wander in lead(s) II III aVF V5 Confirmed by Veryl Speak [phone]) on 06/24/2017 3:23:06 AM       Radiology No results found.  Procedures Procedures (including critical care time)  Medications Ordered in ED Medications - No data to display   Initial Impression / Assessment and Plan / ED Course  I have reviewed the triage vital signs and the nursing notes.  Pertinent labs & imaging results that were available during my care of the patient were reviewed by me and considered in my medical decision making (see chart for details).  Patient presents here after waking up in the night with chest pain.  Her initial EKG was unchanged.  Initial troponin was 0.08, then 3 hours later increased to 0.31 concerning for a non-STEMI.  She received aspirin by EMS and has now been started on heparin.  I have discussed the care with Dr. Debara Pickett from cardiology.  The patient will be transferred to [facility] for likely cardiac cath.  CRITICAL CARE Performed by: Veryl Speak Total critical care time: 35 minutes Critical care time was exclusive of separately billable procedures and treating other patients. Critical care was necessary to treat or prevent imminent or life-threatening deterioration. Critical care was time spent personally by me on the following activities: development of treatment  plan with patient and/or surrogate as well as nursing, discussions with consultants, evaluation of patient's response to treatment, examination of patient, obtaining history from patient or surrogate, ordering and performing treatments and interventions, ordering and review of laboratory studies, ordering and review of radiographic studies, pulse oximetry and re-evaluation of patient's condition.   Final Clinical Impressions(s) / ED Diagnoses   Final diagnoses:  None    ED Discharge Orders    None       Veryl Speak, MD 06/24/17 4259    Veryl Speak, MD 06/24/17 [phone]

## 2017-06-24 NOTE — ED Notes (Signed)
Given report to [facility]

## 2017-06-24 NOTE — ED Notes (Signed)
CRITICAL VALUE ALERT  Critical Value: Troponin 0.08 Date & Time Notied: 06/24/17 @ 0600 Provider Notified: DELO Orders Received/Actions taken: None Yet

## 2017-06-24 NOTE — ED Notes (Addendum)
Tried to give report to Nurse at [facility]. Nurse still in report and will call me back

## 2017-06-25 ENCOUNTER — Inpatient Hospital Stay (HOSPITAL_COMMUNITY)
Admission: EM | Disposition: A | Discharge status: Home Health | Payer: Self-pay | Source: Home / Self Care | Attending: Internal Medicine

## 2017-06-25 DIAGNOSIS — N183 Chronic kidney disease, stage 3 (moderate): Secondary | ICD-10-CM

## 2017-06-25 DIAGNOSIS — I25810 Atherosclerosis of coronary artery bypass graft(s) without angina pectoris: Secondary | ICD-10-CM

## 2017-06-25 DIAGNOSIS — I10 Essential (primary) hypertension: Secondary | ICD-10-CM

## 2017-06-25 HISTORY — PX: CORONARY STENT INTERVENTION: CATH118234

## 2017-06-25 HISTORY — PX: LEFT HEART CATH AND CORS/GRAFTS ANGIOGRAPHY: CATH118250

## 2017-06-25 LAB — CBC
HEMATOCRIT: 35.4 % — AB (ref 36.0–46.0)
HEMATOCRIT: 37.2 % (ref 36.0–46.0)
HEMOGLOBIN: 11.9 g/dL — AB (ref 12.0–15.0)
HEMOGLOBIN: 12.9 g/dL (ref 12.0–15.0)
MCH: 30.4 pg (ref 26.0–34.0)
MCH: 30.9 pg (ref 26.0–34.0)
MCHC: 33.6 g/dL (ref 30.0–36.0)
MCHC: 34.7 g/dL (ref 30.0–36.0)
MCV: 90.3 fL (ref 78.0–100.0)
MCV: 89 fL (ref 78.0–100.0)
Platelets: 194 10*3/uL (ref 150–400)
Platelets: 206 10*3/uL (ref 150–400)
RBC: 3.92 MIL/uL (ref 3.87–5.11)
RBC: 4.18 MIL/uL (ref 3.87–5.11)
RDW: 13.3 % (ref 11.5–15.5)
RDW: 12.9 % (ref 11.5–15.5)
WBC: 6.2 10*3/uL (ref 4.0–10.5)
WBC: 7.1 10*3/uL (ref 4.0–10.5)

## 2017-06-25 LAB — CREATININE, SERUM
Creatinine, Ser: 1.24 mg/dL — ABNORMAL HIGH (ref 0.44–1.00)
GFR calc Af Amer: 49 mL/min — ABNORMAL LOW (ref >60)
GFR, EST NON AFRICAN AMERICAN: 42 mL/min — AB (ref >60)

## 2017-06-25 LAB — POCT I-STAT, CHEM 8
BUN: 20 mg/dL (ref 6–20)
CREATININE: 1.2 mg/dL — AB (ref 0.44–1.00)
Calcium, Ion: 1.26 mmol/L (ref 1.15–1.40)
Chloride: 109 mmol/L (ref 101–111)
Glucose, Bld: 91 mg/dL (ref 65–99)
HEMATOCRIT: 34 % — AB (ref 36.0–46.0)
Hemoglobin: 11.6 g/dL — ABNORMAL LOW (ref 12.0–15.0)
POTASSIUM: 3.9 mmol/L (ref 3.5–5.1)
SODIUM: 142 mmol/L (ref 135–145)
TCO2: 25 mmol/L (ref 22–32)

## 2017-06-25 LAB — POCT ACTIVATED CLOTTING TIME: Activated Clotting Time: 411 seconds

## 2017-06-25 LAB — GLUCOSE, CAPILLARY
Glucose-Capillary: 77 mg/dL (ref 65–99)
Glucose-Capillary: 129 mg/dL — ABNORMAL HIGH (ref 65–99)
Glucose-Capillary: 58 mg/dL — ABNORMAL LOW (ref 65–99)

## 2017-06-25 LAB — BASIC METABOLIC PANEL
ANION GAP: 9 (ref 5–15)
BUN: 23 mg/dL — ABNORMAL HIGH (ref 6–20)
CALCIUM: 8.8 mg/dL — AB (ref 8.9–10.3)
CO2: 23 mmol/L (ref 22–32)
CREATININE: 1.33 mg/dL — AB (ref 0.44–1.00)
Chloride: 108 mmol/L (ref 101–111)
GFR calc non Af Amer: 39 mL/min — ABNORMAL LOW (ref >60)
GFR, EST AFRICAN AMERICAN: 45 mL/min — AB (ref >60)
Glucose, Bld: 113 mg/dL — ABNORMAL HIGH (ref 65–99)
Potassium: 3.2 mmol/L — ABNORMAL LOW (ref 3.5–5.1)
SODIUM: 140 mmol/L (ref 135–145)

## 2017-06-25 LAB — PROTIME-INR
INR: 1.07
Prothrombin Time: 13.8 seconds (ref 11.4–15.2)

## 2017-06-25 LAB — HEPARIN LEVEL (UNFRACTIONATED)
HEPARIN UNFRACTIONATED: 0.26 [IU]/mL — AB (ref 0.30–0.70)
HEPARIN UNFRACTIONATED: 0.39 [IU]/mL (ref 0.30–0.70)

## 2017-06-25 SURGERY — LEFT HEART CATH AND CORS/GRAFTS ANGIOGRAPHY
Anesthesia: LOCAL

## 2017-06-25 MED ORDER — ATROPINE SULFATE 1 MG/10ML IJ SOSY
PREFILLED_SYRINGE | INTRAMUSCULAR | Status: AC
  Filled 2017-06-25: qty 10

## 2017-06-25 MED ORDER — ANGIOPLASTY BOOK
Freq: Once | Status: AC
  Administered 2017-06-25: 22:00:00 1
  Filled 2017-06-25: qty 1

## 2017-06-25 MED ORDER — SODIUM CHLORIDE 0.9% FLUSH: 3.0000 mL | INTRAVENOUS | Status: DC | PRN

## 2017-06-25 MED ORDER — TICAGRELOR 90 MG PO TABS
ORAL_TABLET | ORAL | Status: DC | PRN
  Administered 2017-06-25: 180 mg via ORAL

## 2017-06-25 MED ORDER — HEPARIN SODIUM (PORCINE) 5000 UNIT/ML IJ SOLN
5000.0000 [IU] | Freq: Three times a day (TID) | INTRAMUSCULAR | Status: DC
  Administered 2017-06-26 – 2017-06-27 (×5): 5000 [IU] via SUBCUTANEOUS
  Filled 2017-06-25 (×5): qty 1

## 2017-06-25 MED ORDER — HEPARIN (PORCINE) IN NACL 2-0.9 UNIT/ML-% IJ SOLN
INTRAMUSCULAR | Status: DC | PRN
  Administered 2017-06-25 (×2): 500 mL

## 2017-06-25 MED ORDER — TICAGRELOR 90 MG PO TABS
ORAL_TABLET | ORAL | Status: AC
  Filled 2017-06-25: qty 1

## 2017-06-25 MED ORDER — HEPARIN (PORCINE) IN NACL 2-0.9 UNIT/ML-% IJ SOLN
INTRAMUSCULAR | Status: AC
  Filled 2017-06-25: qty 500

## 2017-06-25 MED ORDER — INSULIN ASPART 100 UNIT/ML ~~LOC~~ SOLN
0.0000 [IU] | Freq: Every day | SUBCUTANEOUS | Status: DC
Start: 2017-06-25 — End: 2017-06-27

## 2017-06-25 MED ORDER — IOPAMIDOL (ISOVUE-370) INJECTION 76%
INTRAVENOUS | Status: AC
  Filled 2017-06-25: qty 125

## 2017-06-25 MED ORDER — SODIUM CHLORIDE 0.9 % IV SOLN: 250.0000 mL | INTRAVENOUS | Status: DC | PRN

## 2017-06-25 MED ORDER — ISOSORBIDE DINITRATE 30 MG PO TABS
30.0000 mg | ORAL_TABLET | Freq: Every morning | ORAL | Status: DC
  Administered 2017-06-26 – 2017-06-27 (×2): 30 mg via ORAL
  Filled 2017-06-25 (×3): qty 1

## 2017-06-25 MED ORDER — POTASSIUM CHLORIDE CRYS ER 20 MEQ PO TBCR
40.0000 meq | EXTENDED_RELEASE_TABLET | Freq: Once | ORAL | Status: AC
  Administered 2017-06-25: 40 meq via ORAL
  Filled 2017-06-25: qty 2

## 2017-06-25 MED ORDER — ASPIRIN 81 MG PO CHEW
81.0000 mg | CHEWABLE_TABLET | Freq: Every day | ORAL | Status: DC
  Administered 2017-06-26 – 2017-06-27 (×2): 81 mg via ORAL
  Filled 2017-06-25 (×2): qty 1

## 2017-06-25 MED ORDER — LIDOCAINE HCL (PF) 1 % IJ SOLN
INTRAMUSCULAR | Status: AC
  Filled 2017-06-25: qty 30

## 2017-06-25 MED ORDER — ONDANSETRON HCL 4 MG/2ML IJ SOLN: 4.0000 mg | Freq: Four times a day (QID) | INTRAMUSCULAR | Status: DC | PRN

## 2017-06-25 MED ORDER — LABETALOL HCL 5 MG/ML IV SOLN
10.0000 mg | INTRAVENOUS | Status: AC | PRN
  Administered 2017-06-25: 10 mg via INTRAVENOUS
  Filled 2017-06-25: qty 4

## 2017-06-25 MED ORDER — TICAGRELOR 90 MG PO TABS
90.0000 mg | ORAL_TABLET | Freq: Once | ORAL | Status: AC
  Administered 2017-06-26: 05:00:00 90 mg via ORAL
  Filled 2017-06-25: qty 1

## 2017-06-25 MED ORDER — BIVALIRUDIN BOLUS VIA INFUSION - CUPID
INTRAVENOUS | Status: DC | PRN
  Administered 2017-06-25: 43.125 mg via INTRAVENOUS

## 2017-06-25 MED ORDER — FENTANYL CITRATE (PF) 100 MCG/2ML IJ SOLN
INTRAMUSCULAR | Status: AC
  Filled 2017-06-25: qty 2

## 2017-06-25 MED ORDER — MIDAZOLAM HCL 2 MG/2ML IJ SOLN
INTRAMUSCULAR | Status: DC | PRN
  Administered 2017-06-25 (×2): 1 mg via INTRAVENOUS

## 2017-06-25 MED ORDER — INSULIN ASPART 100 UNIT/ML ~~LOC~~ SOLN
0.0000 [IU] | Freq: Three times a day (TID) | SUBCUTANEOUS | Status: DC
  Administered 2017-06-26: 13:00:00 1 [IU] via SUBCUTANEOUS

## 2017-06-25 MED ORDER — MIDAZOLAM HCL 2 MG/2ML IJ SOLN
INTRAMUSCULAR | Status: AC
  Filled 2017-06-25: qty 2

## 2017-06-25 MED ORDER — ACTIVE PARTNERSHIP FOR HEALTH OF YOUR HEART BOOK
Freq: Once | Status: AC
  Administered 2017-06-25: 22:00:00 1
  Filled 2017-06-25: qty 1

## 2017-06-25 MED ORDER — DEXTROSE 50 % IV SOLN
INTRAVENOUS | Status: AC
  Administered 2017-06-25: 17:00:00
  Filled 2017-06-25: qty 50

## 2017-06-25 MED ORDER — SODIUM CHLORIDE 0.9 % IV SOLN
INTRAVENOUS | Status: DC | PRN
  Administered 2017-06-25: 1.75 mg/kg/h via INTRAVENOUS

## 2017-06-25 MED ORDER — HEART ATTACK BOUNCING BOOK
Freq: Once | Status: AC
  Administered 2017-06-25: 1
  Filled 2017-06-25: qty 1

## 2017-06-25 MED ORDER — FENTANYL CITRATE (PF) 100 MCG/2ML IJ SOLN
INTRAMUSCULAR | Status: DC | PRN
  Administered 2017-06-25 (×2): 25 ug via INTRAVENOUS

## 2017-06-25 MED ORDER — IOPAMIDOL (ISOVUE-370) INJECTION 76%
INTRAVENOUS | Status: DC | PRN
  Administered 2017-06-25: 105 mL via INTRA_ARTERIAL

## 2017-06-25 MED ORDER — TICAGRELOR 90 MG PO TABS
90.0000 mg | ORAL_TABLET | Freq: Two times a day (BID) | ORAL | Status: DC
  Administered 2017-06-26 – 2017-06-27 (×2): 90 mg via ORAL
  Filled 2017-06-25 (×2): qty 1

## 2017-06-25 MED ORDER — BIVALIRUDIN TRIFLUOROACETATE 250 MG IV SOLR
INTRAVENOUS | Status: AC
  Filled 2017-06-25: qty 250

## 2017-06-25 MED ORDER — ACETAMINOPHEN 325 MG PO TABS: 650.0000 mg | ORAL_TABLET | ORAL | Status: DC | PRN

## 2017-06-25 MED ORDER — LIDOCAINE HCL (PF) 1 % IJ SOLN
INTRAMUSCULAR | Status: DC | PRN
  Administered 2017-06-25: 20 mL via SUBCUTANEOUS

## 2017-06-25 MED ORDER — SODIUM CHLORIDE 0.9 % IV SOLN
INTRAVENOUS | Status: AC
  Administered 2017-06-25: 17:00:00 via INTRAVENOUS

## 2017-06-25 MED ORDER — HYDRALAZINE HCL 20 MG/ML IJ SOLN
5.0000 mg | INTRAMUSCULAR | Status: AC | PRN
  Administered 2017-06-25 (×3): 5 mg via INTRAVENOUS
  Filled 2017-06-25 (×3): qty 1

## 2017-06-25 MED ORDER — SODIUM CHLORIDE 0.9% FLUSH
3.0000 mL | Freq: Two times a day (BID) | INTRAVENOUS | Status: DC
  Administered 2017-06-26 (×2): 3 mL via INTRAVENOUS

## 2017-06-25 MED ORDER — TICAGRELOR 90 MG PO TABS
ORAL_TABLET | ORAL | Status: AC
  Filled 2017-06-25: qty 2

## 2017-06-25 SURGICAL SUPPLY — 15 items
CATH INFINITI 5FR MULTPACK ANG (CATHETERS) ×1 IMPLANT
CATH LAUNCHER 6FR AL.75 (CATHETERS) ×1 IMPLANT
COVER PRB 48X5XTLSCP FOLD TPE (BAG) IMPLANT
COVER PROBE 5X48 (BAG) ×2
KIT ENCORE 26 ADVANTAGE (KITS) ×1 IMPLANT
KIT HEART LEFT (KITS) ×2 IMPLANT
KIT HEMO VALVE WATCHDOG (MISCELLANEOUS) ×1 IMPLANT
PACK CARDIAC CATHETERIZATION (CUSTOM PROCEDURE TRAY) ×2 IMPLANT
SHEATH AVANTI 11CM 5FR (SHEATH) ×1 IMPLANT
SHEATH AVANTI 11CM 6FR (SHEATH) ×1 IMPLANT
STENT SIERRA 4.00 X 15 MM (Permanent Stent) ×1 IMPLANT
SYR MEDRAD MARK V 150ML (SYRINGE) ×1 IMPLANT
TRANSDUCER W/STOPCOCK (MISCELLANEOUS) ×2 IMPLANT
TUBING CIL FLEX 10 FLL-RA (TUBING) ×2 IMPLANT
WIRE EMERALD 3MM-J .035X150CM (WIRE) ×1 IMPLANT

## 2017-06-25 NOTE — Interval H&P Note (Signed)
Cath Lab Visit (complete for each Cath Lab visit)  Clinical Evaluation Leading to the Procedure:   ACS: Yes.    Non-ACS:    Anginal Classification: CCS IV  Anti-ischemic medical therapy: Minimal Therapy (1 class of medications)  Non-Invasive Test Results: No non-invasive testing performed  Prior CABG: Previous CABG      History and Physical Interval Note:  06/25/2017 2:50 PM  [name]  has presented today for surgery, with the diagnosis of cp  The various methods of treatment have been discussed with the patient and family. After consideration of risks, benefits and other options for treatment, the patient has consented to  Procedure(s): LEFT HEART CATH AND CORS/GRAFTS ANGIOGRAPHY (N/A) as a surgical intervention .  The patient's history has been reviewed, patient examined, no change in status, stable for surgery.  I have reviewed the patient's chart and labs.  Questions were answered to the patient's satisfaction.     Erin Jordan

## 2017-06-25 NOTE — Progress Notes (Signed)
[place] for Heparin Indication:ACS/STEMI   No Known Allergies  Patient Measurements: Height: 5\' 4"  (162.6 cm) Weight: 126 lb 11.2 oz (57.5 kg) IBW/kg (Calculated) : 54.7 HEPARIN DW (KG): 69   Vital Signs: Temp: 97.8 F (36.6 C) (03/20 0531) Temp Source: Oral (03/20 0531) BP: 124/84 (03/20 1050) Pulse Rate: 77 (03/20 0531)  Labs: Recent Labs    06/24/17 0321 06/24/17 0622 06/24/17 1126 06/24/17 1617 06/25/17 0400 06/25/17 0453 06/25/17 1229  HGB 14.1  --   --   --  11.9*  --   --   HCT 41.6  --   --   --  35.4*  --   --   PLT 206  --   --   --  194  --   --   APTT 27  --   --   --   --   --   --   LABPROT  --   --   --   --   --  13.8  --   INR  --   --   --   --   --  1.07  --   HEPARINUNFRC  --   --   --  0.84* 0.26*  --  0.39  CREATININE 1.88*  --   --   --   --  1.33*  --   TROPONINI  --  0.31* 1.67* 2.74*  --   --   --    Estimated Creatinine Clearance: 32.5 mL/min (A) (by C-G formula based on SCr of 1.33 mg/dL (H)).  Medical History: Past Medical History:  Diagnosis Date  . Arthritis   . Chronic kidney disease, stage 3, mod decreased GFR (HCC)   . Coronary atherosclerosis of native coronary artery 2006   Multivessel status post CABG in [place]  . Diabetes mellitus, type 2 ([place])   . Essential hypertension, benign   . 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 Hyperlipidemia      Assessment: 72 yo female with hx of CAD s/p CABG seen in the ED on 06/24/17 for chest pain.  Pt was not on anticoagulation PTA. Positive troponin. Transferred to [facility] from [facility] for possible cath; however, patient also noted to have AKI. Scr has improved to 1.33 after IVF.  Remains on heparin for rule out ACS. Heparin levels have been fluctuating widely with very small rate adjustments. Heparin rate increased this AM to 800 units/hr and the 6 hour confirmatory level is therapeutic at  0.39 now. CBC : Hgb 14.1>11.9, ptlc wnl  206>194k No bleeding noted.  No reoccurrence of chest pain. Denies SOB or palpitations Plan for cardiac cath today   Goal of Therapy:  Heparin level goal: 0.3-0.7 units/ml Monitor platelets by anticoagulation protocol: Yes    Plan:  Continue heparin to 800 units/hr Daily HL, CBC F/u after cath today.  Nicole Cella, [place] Clinical Pharmacist Pager: [phone] or [phone]) Main Rx [phone] 06/25/2017 2:05 PM

## 2017-06-25 NOTE — Progress Notes (Addendum)
[place] for Heparin Indication:ACS/STEMI   No Known Allergies  Patient Measurements: Height: 5\' 4"  (162.6 cm) Weight: 126 lb 11.2 oz (57.5 kg) IBW/kg (Calculated) : 54.7 HEPARIN DW (KG): 69   Vital Signs: Temp: 98.4 F (36.9 C) (03/19 1957) Temp Source: Oral (03/19 1957) BP: 174/88 (03/19 1957) Pulse Rate: 90 (03/19 1957)  Labs: Recent Labs    06/24/17 0321 06/24/17 0622 06/24/17 1126 06/24/17 1617 06/25/17 0400  HGB 14.1  --   --   --   --   HCT 41.6  --   --   --   --   PLT 206  --   --   --   --   APTT 27  --   --   --   --   HEPARINUNFRC  --   --   --  0.84* 0.26*  CREATININE 1.88*  --   --   --   --   TROPONINI  --  0.31* 1.67* 2.74*  --    Estimated Creatinine Clearance: 23 mL/min (A) (by C-G formula based on SCr of 1.88 mg/dL (H)).  Medical History: Past Medical History:  Diagnosis Date  . Arthritis   . Chronic kidney disease, stage 3, mod decreased GFR (HCC)   . Coronary atherosclerosis of native coronary artery 2006   Multivessel status post CABG in [place]  . Diabetes mellitus, type 2 ([place])   . Essential hypertension, benign   . 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 Hyperlipidemia      Assessment: 72 yo female with hx of CAD s/p CABG seen in the ED for chest pain.  Positive troponin. Transferred to [facility] from [facility] for possible cath; however, patient also has AKI. Remains on heparin for rule out ACS. Heparin levels are fluctuating widely with very small rate adjustments. Heparin level is subtherapeutic this morning.   Goal of Therapy:  Heparin level goal: 0.3-0.7 units/ml Monitor platelets by anticoagulation protocol: Yes    Plan:  Increase heparin to 800 units/hr Daily HL, CBC Check confirmatory level    Harvel Quale 06/25/2017 4:55 AM
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
## 2017-06-25 NOTE — Progress Notes (Signed)
Site area: right groin  Site Prior to Removal:  Level 0  Pressure Applied For 15 MINUTES    Minutes Beginning at 20:30  Manual:   Yes.    Patient Status During Pull:  WNL  Post Pull Groin Site:  Level 0  Post Pull Instructions Given:  Yes  Post Pull Pulses Present:  Yes.    Dressing Applied:  Yes.    Comments:  Pt tolerated well.

## 2017-06-25 NOTE — Progress Notes (Signed)
Progress Note  Patient Name: Erin Jordan Date of Encounter: 06/25/2017  Primary Cardiologist: Rozann Lesches, MD   Subjective   Without complaints this morning. No reoccurrence of chest pain. Denies SOB or palpitations. Understands she is planned for cardiac catheterization this afternoon.   Inpatient Medications    Scheduled Meds: . amLODipine  10 mg Oral Daily  . aspirin  81 mg Oral Daily  . atorvastatin  40 mg Oral q1800  . Influenza vac split quadrivalent PF  0.5 mL Intramuscular Tomorrow-1000  . insulin aspart  0-5 Units Subcutaneous QHS  . insulin aspart  0-9 Units Subcutaneous TID WC  . pneumococcal 23 valent vaccine  0.5 mL Intramuscular Tomorrow-1000  . sodium chloride flush  3 mL Intravenous Q12H   Continuous Infusions: . sodium chloride 100 mL/hr at 06/24/17 0917  . sodium chloride    . sodium chloride 100 mL/hr at 06/24/17 2017  . heparin 800 Units/hr (06/25/17 0530)   PRN Meds: sodium chloride, acetaminophen, nitroGLYCERIN, ondansetron (ZOFRAN) IV, sodium chloride flush   Vital Signs    Vitals:   06/24/17 1807 06/24/17 1900 06/24/17 1957 06/25/17 0531  BP: (!) 161/83 (!) 149/77 (!) 174/88 (!) 145/92  Pulse: 88 89 90 77  Resp: 17 16 17    Temp:   98.4 F (36.9 C) 97.8 F (36.6 C)  TempSrc:   Oral Oral  SpO2: 100% 100% 100% 100%  Weight:   126 lb 11.2 oz (57.5 kg) 126 lb 11.2 oz (57.5 kg)  Height:        Intake/Output Summary (Last 24 hours) at 06/25/2017 0912 Last data filed at 06/25/2017 0300 Gross per 24 hour  Intake 2595.71 ml  Output -  Net 2595.71 ml   Filed Weights   06/24/17 0312 06/24/17 1957 06/25/17 0531  Weight: 155 lb (70.3 kg) 126 lb 11.2 oz (57.5 kg) 126 lb 11.2 oz (57.5 kg)    Telemetry    NSR, rare PVC, 1st degree AV block - Personally Reviewed  ECG    Sinus rhythm with 1st degree AV block; no STE/D - Personally Reviewed  Physical Exam   GEN: Elderly female laying in bed in no acute distress.   Neck: No JVD, no  carotid bruits Cardiac: RRR, no murmurs, rubs, or gallops.  Respiratory: Clear to auscultation bilaterally, no wheezes/ rales/ rhonchi GI: NABS, Soft, nontender, non-distended  MS: No edema; No deformity. Neuro:  Nonfocal, moving all extremities spontaneously Psych: Normal affect   Labs    Chemistry Recent Labs  Lab 06/24/17 0321 06/25/17 0453  NA 140 140  K 3.2* 3.2*  CL 102 108  CO2 25 23  GLUCOSE 154* 113*  BUN 31* 23*  CREATININE 1.88* 1.33*  CALCIUM 9.5 8.8*  GFRNONAA 25* 39*  GFRAA 29* 45*  ANIONGAP 13 9     Hematology Recent Labs  Lab 06/24/17 0321 06/25/17 0400  WBC 6.1 6.2  RBC 4.62 3.92  HGB 14.1 11.9*  HCT 41.6 35.4*  MCV 90.0 90.3  MCH 30.5 30.4  MCHC 33.9 33.6  RDW 12.9 13.3  PLT 206 194    Cardiac Enzymes Recent Labs  Lab 06/24/17 0320 06/24/17 0622 06/24/17 1126 06/24/17 1617  TROPONINI 0.08* 0.31* 1.67* 2.74*   No results for input(s): TROPIPOC in the last 168 hours.   BNPNo results for input(s): BNP, PROBNP in the last 168 hours.   DDimer No results for input(s): DDIMER in the last 168 hours.   Radiology    Dg Chest 2  View  Result Date: 06/24/2017 CLINICAL DATA:  Left-sided chest pain. EXAM: CHEST - 2 VIEW COMPARISON:  06/01/2011 FINDINGS: Post median sternotomy.The cardiomediastinal contours are unchanged, heart size is normal. The lungs are clear. Pulmonary vasculature is normal. No consolidation, pleural effusion, or pneumothorax. No acute osseous abnormalities are seen. IMPRESSION: No acute pulmonary process. Electronically Signed   By: Jeb Levering M.D.   On: 06/24/2017 04:23    Cardiac Studies   Echocardiogram 2013: Study Conclusions  - Left ventricle: The cavity size was normal.Mild tomoderate LVH with disproportionate septal thickening. Systolic function was normal. The estimated ejection fraction was in the range of 60% to 65%. Wall motion was normal; there were no regional wall motion abnormalities. -  Aortic valve: Mildly calcified annulus. - Mitral valve: Calcified annulus. - Right ventricle: The cavity size was normal. Wall thickness was mildly to moderately increased. - Atrial septum: No defect or patent foramen ovale was identified.   Patient Profile     72 y.o. female with past medical history of CABG [place] 2006, HTN, DM type 2, CKD stage 3, presented to [facility] ED for chest pain and positive troponin. Transferred to [facility] for cardiac catheterization  Assessment & Plan    1. Chest pain: presented with CP that awoke her from sleep. EKG without STE/D. Trop 0.08>0.31>1.67>2.74. Currently chest pain free - Planned for LHC today - Continue heparin gtt per pharmacy - Continue ASA and statin; prn SL nitro  2. HTN: BP elevated - likely in setting of holding home HCTZ and lisinopril due to AKI.  - Continue amlodipine - PRN hydralazine for SBP >180 - Anticipate restarting HCTZ and lisinopril tomorrow if Cr stable post cath  3. DM type 2: goal A1C <7 - ISS inpatient - can resume glipizide at discharge  4. CKD stage 3: Presented with Cr 1.88, improved to 1.33 this AM after IVF. Baseline 1.4 - Continue to monitor closely post cath  5. Hypokalemia: K 3.2 this AM - Repleted with 40 mEq potassium  For questions or updates, please contact [place] Please consult [email or web address] for contact info under Cardiology/STEMI.      Signed, Abigail Butts, PA-C  06/25/2017, 9:12 AM   [phone]

## 2017-06-25 NOTE — Plan of Care (Signed)
No complaints of pain this AM, patient interment confusion.  Family at bedside and updated on the plan of care

## 2017-06-26 ENCOUNTER — Encounter (HOSPITAL_COMMUNITY): Payer: Self-pay | Admitting: Interventional Cardiology

## 2017-06-26 LAB — CBC
HEMATOCRIT: 39.6 % (ref 36.0–46.0)
HEMOGLOBIN: 13.5 g/dL (ref 12.0–15.0)
MCH: 30.4 pg (ref 26.0–34.0)
MCHC: 34.1 g/dL (ref 30.0–36.0)
MCV: 89.2 fL (ref 78.0–100.0)
Platelets: 207 10*3/uL (ref 150–400)
RBC: 4.44 MIL/uL (ref 3.87–5.11)
RDW: 13 % (ref 11.5–15.5)
WBC: 7 10*3/uL (ref 4.0–10.5)

## 2017-06-26 LAB — GLUCOSE, CAPILLARY
GLUCOSE-CAPILLARY: 148 mg/dL — AB (ref 65–99)
GLUCOSE-CAPILLARY: 148 mg/dL — AB (ref 65–99)
Glucose-Capillary: 115 mg/dL — ABNORMAL HIGH (ref 65–99)
Glucose-Capillary: 103 mg/dL — ABNORMAL HIGH (ref 65–99)
Glucose-Capillary: 115 mg/dL — ABNORMAL HIGH (ref 65–99)

## 2017-06-26 LAB — BASIC METABOLIC PANEL
ANION GAP: 10 (ref 5–15)
BUN: 18 mg/dL (ref 6–20)
CALCIUM: 9.6 mg/dL (ref 8.9–10.3)
CO2: 23 mmol/L (ref 22–32)
CREATININE: 1.45 mg/dL — AB (ref 0.44–1.00)
Chloride: 106 mmol/L (ref 101–111)
GFR, EST AFRICAN AMERICAN: 40 mL/min — AB (ref >60)
GFR, EST NON AFRICAN AMERICAN: 35 mL/min — AB (ref >60)
GLUCOSE: 117 mg/dL — AB (ref 65–99)
Potassium: 4 mmol/L (ref 3.5–5.1)
Sodium: 139 mmol/L (ref 135–145)

## 2017-06-26 MED ORDER — METOPROLOL SUCCINATE ER 50 MG PO TB24
50.0000 mg | ORAL_TABLET | Freq: Every day | ORAL | Status: DC
  Administered 2017-06-26 – 2017-06-27 (×2): 50 mg via ORAL
  Filled 2017-06-26 (×2): qty 1

## 2017-06-26 MED FILL — Heparin Sodium (Porcine) 2 Unit/ML in Sodium Chloride 0.9%: INTRAMUSCULAR | Qty: 1000 | Status: AC

## 2017-06-26 NOTE — Progress Notes (Signed)
CARDIAC REHAB PHASE I   PRE:  Rate/Rhythm: 86 SR    BP: sitting 156/87    SaO2:   MODE:  Ambulation: 500 ft   POST:  Rate/Rhythm: 109 ST    BP: sitting 153/66     SaO2:   Pt asleep on my arrival. Seemed to be slow to wake and be alert. Used RW and gait belt in hall, cognition seemed to improve with distance although I am unclear about how much activity she does at home. She sts she has a RW and that she cares for her daughter who is in a w/c. Pt to recliner. I will attempt to wait on family for education (has 6 kids). [place], ACSM 06/26/2017 9:44 AM

## 2017-06-26 NOTE — Care Management Note (Addendum)
Case Management Note  Patient Details  Name: Erin Jordan MRN: [id number] Date of Birth: 1945/07/25  Subjective/Objective: Pt presented for Cardiac cath.                    Action/Plan: Benefits Check in process for Brilinta. CM will make pt aware of cost once completed. CM will provide pt with 30 day free Brilinta Card.   Expected Discharge Date:                  Expected Discharge Plan:  Home/Self Care  In-House Referral:  NA  Discharge planning Services  CM Consult, Medication Assistance  Post Acute Care Choice:  NA Choice offered to:  NA  DME Arranged:  N/A DME Agency:  NA  HH Arranged: PT HH Agency:    Status of Service: In Process, will continue to follow  If discussed at Long Length of Stay Meetings, dates discussed:    Additional Comments: 1137 06-26-17 Jacqlyn Krauss, RN,BSN [phone] CM did speak with patient and family- Pt has had some previous falls in the home. Pt currently living with son and plan will be to return home. CM did ask for PT/OT consult. Per pt she uses [facility] in [place] and the pharmacy delivers. CM did check and Brilinta is in stock. Benefits Check completed and CM will make pt aware of the cost. CM did make pt aware to call the support number for the Brilinta as well. Pt is without PCP and the [facility] Information was provided to patient to call for PCP. CM will see if pt needs HH post ambulation in the halls. No further needs from CM at this time.    S/W MARLINE @ [employer] [facility] # [phone] OPT- 2    BRILINTA  90 MG BID  COVER- YES  CO-PAY- $ 192.00  TIER- 3 DRUG  PRIOR APPROVAL- NO   DEDUCTIBLE : NOT MET / WHEN MET CO-PAY- $ 47.00   PREFERRED PHARMACY : [facility] AND [facility]  Bethena Roys, RN 06/26/2017, 9:34 AM

## 2017-06-26 NOTE — Progress Notes (Signed)
Progress Note  Patient Name: Erin Jordan Date of Encounter: 06/26/2017  Primary Cardiologist: Rozann Lesches, MD   Subjective   Feeling well today. Denies any chest pain or dyspnea. No palpitations.   Inpatient Medications    Scheduled Meds: . amLODipine  10 mg Oral Daily  . aspirin  81 mg Oral Daily  . atorvastatin  40 mg Oral q1800  . heparin  5,000 Units Subcutaneous Q8H  . Influenza vac split quadrivalent PF  0.5 mL Intramuscular Tomorrow-1000  . insulin aspart  0-5 Units Subcutaneous QHS  . insulin aspart  0-9 Units Subcutaneous TID WC  . isosorbide dinitrate  30 mg Oral q morning - 10a  . pneumococcal 23 valent vaccine  0.5 mL Intramuscular Tomorrow-1000  . sodium chloride flush  3 mL Intravenous Q12H  . ticagrelor  90 mg Oral BID   Continuous Infusions: . sodium chloride Stopped (06/25/17 1900)  . sodium chloride     PRN Meds: sodium chloride, acetaminophen, nitroGLYCERIN, ondansetron (ZOFRAN) IV, sodium chloride flush   Vital Signs    Vitals:   06/26/17 0100 06/26/17 0200 06/26/17 0300 06/26/17 0700  BP: 132/80 (!) 137/59 (!) 145/76 (!) 163/78  Pulse: 92 82 93 97  Resp: 15 (!) 1 (!) 24 17  Temp:   98.2 F (36.8 C) 98.1 F (36.7 C)  TempSrc:   Oral Oral  SpO2: 100% 100% 100% 100%  Weight:   127 lb 13.9 oz (58 kg)   Height:        Intake/Output Summary (Last 24 hours) at 06/26/2017 1026 Last data filed at 06/26/2017 0300 Gross per 24 hour  Intake 1614 ml  Output 1350 ml  Net 264 ml   Filed Weights   06/24/17 1957 06/25/17 0531 06/26/17 0300  Weight: 126 lb 11.2 oz (57.5 kg) 126 lb 11.2 oz (57.5 kg) 127 lb 13.9 oz (58 kg)    Telemetry    NSR- Personally Reviewed  ECG    Sinus rhythm. T wave inversion inferiorly. - Personally Reviewed  Physical Exam   GEN: Elderly female in no acute distress.   Neck: No JVD, no carotid bruits Cardiac: RRR, no murmurs, rubs, or gallops.  Respiratory: Clear to auscultation bilaterally, no wheezes/  rales/ rhonchi GI: NABS, Soft, nontender, non-distended  MS: No edema; No deformity. Groin without hematoma. Neuro:  Nonfocal, moving all extremities spontaneously Psych: Normal affect   Labs    Chemistry Recent Labs  Lab 06/24/17 0321 06/25/17 0453 06/25/17 1507 06/25/17 2306 06/26/17 0355  NA 140 140 142  --  139  K 3.2* 3.2* 3.9  --  4.0  CL 102 108 109  --  106  CO2 25 23  --   --  23  GLUCOSE 154* 113* 91  --  117*  BUN 31* 23* 20  --  18  CREATININE 1.88* 1.33* 1.20* 1.24* 1.45*  CALCIUM 9.5 8.8*  --   --  9.6  GFRNONAA 25* 39*  --  42* 35*  GFRAA 29* 45*  --  49* 40*  ANIONGAP 13 9  --   --  10     Hematology Recent Labs  Lab 06/25/17 0400 06/25/17 1507 06/25/17 2306 06/26/17 0355  WBC 6.2  --  7.1 7.0  RBC 3.92  --  4.18 4.44  HGB 11.9* 11.6* 12.9 13.5  HCT 35.4* 34.0* 37.2 39.6  MCV 90.3  --  89.0 89.2  MCH 30.4  --  30.9 30.4  MCHC 33.6  --  34.7 34.1  RDW 13.3  --  12.9 13.0  PLT 194  --  206 207    Cardiac Enzymes Recent Labs  Lab 06/24/17 0320 06/24/17 0622 06/24/17 1126 06/24/17 1617  TROPONINI 0.08* 0.31* 1.67* 2.74*   No results for input(s): TROPIPOC in the last 168 hours.   BNPNo results for input(s): BNP, PROBNP in the last 168 hours.   DDimer No results for input(s): DDIMER in the last 168 hours.   Radiology    No results found.  Cardiac Studies   Echocardiogram 2013: Study Conclusions  - Left ventricle: The cavity size was normal.Mild tomoderate LVH with disproportionate septal thickening. Systolic function was normal. The estimated ejection fraction was in the range of 60% to 65%. Wall motion was normal; there were no regional wall motion abnormalities. - Aortic valve: Mildly calcified annulus. - Mitral valve: Calcified annulus. - Right ventricle: The cavity size was normal. Wall thickness was mildly to moderately increased. - Atrial septum: No defect or patent foramen ovale was identified.  Procedures    CORONARY STENT INTERVENTION  LEFT HEART CATH AND CORS/GRAFTS ANGIOGRAPHY  Conclusion     Ost 1st Diag to 1st Diag lesion is 80% stenosed. THis is a long lesion nad is not grafted.  Prox LAD to Mid LAD lesion is 100% stenosed. LIMA to LAD is patent.  Lat 1st Mrg lesion is 95% stenosed.  Ost 1st Mrg to 1st Mrg lesion is 95% stenosed. SVG to OM is occluded.  Prox RCA lesion is 100% stenosed. SVG to PDA is occluded. This appears recent.  Ost 2nd Diag to 2nd Diag lesion is 100% stenosed. SVG to Diagonal with ostial 75% lesion.  A drug-eluting stent was successfully placed using a STENT SIERRA 4.00 X 15 MM.  Post intervention, there is a 0% residual stenosis.  The left ventricular ejection fraction is 50-55% by visual estimate.  There is no aortic valve stenosis.   Continue aggressive medical therapy.  Complex circumflex bifurcation lesion.  Increase antianginals.  Would likely lose the large lateral branch of the OM1 if PCI were performed.      Patient Profile     72 y.o. female with past medical history of CABG [place] 2006, HTN, DM type 2, CKD stage 3, presented to [facility] ED for chest pain and positive troponin. Transferred to [facility] for cardiac catheterization  Assessment & Plan    1. NSTEMI: presented with CP that awoke her from sleep. EKG shows inferior T wave inversion. Trop 0.08>0.31>1.67>2.74. Currently chest pain free - Cardiac cath demonstrated recent occlusion of SVG to RCA. This appears to be the culprit. 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 Treatment of this percutaneously would likely result in occlusion of one of the branches. Medical therapy recommended. Also had stenosis in proximal SVG to diagonal. This was successfully stented.  - Continue DAPT with ASA and Brilinta for one year.  - continue amlodipine.  - Imdur 30 mg daily added.  - will start Toprol CL 50 mg daily. Patient is tachycardic at rest and BP is  elevated. - ambulate in halls today. If no further angina and renal function stable could DC tomorrow.   2. HTN: BP elevated - likely in setting of holding home HCTZ and lisinopril due to AKI.  - Continue amlodipine - Add Toprol XL and long acting nitrate.  - Anticipate restarting  lisinopril when renal function stable post cath.  3. DM type 2: goal A1C <  7 - ISS inpatient - can resume glipizide at discharge  4. CKD stage 3: Presented with Cr 1.88, improved to 1.33. Up to 1.45 post cath which is at her baseline. Will repeat in am.  - Continue to monitor closely post cath  5. Hypokalemia: K 4.4 this AM - Repleted  For questions or updates, please contact [place] Please consult [email or web address] for contact info under Cardiology/STEMI.      Signed, Aviance [place], MD, [facility] 06/26/2017, 10:26 AM

## 2017-06-27 ENCOUNTER — Other Ambulatory Visit: Payer: Self-pay | Admitting: Cardiology

## 2017-06-27 DIAGNOSIS — Z9861 Coronary angioplasty status: Secondary | ICD-10-CM

## 2017-06-27 DIAGNOSIS — N289 Disorder of kidney and ureter, unspecified: Principal | ICD-10-CM

## 2017-06-27 DIAGNOSIS — N189 Chronic kidney disease, unspecified: Secondary | ICD-10-CM

## 2017-06-27 DIAGNOSIS — E785 Hyperlipidemia, unspecified: Secondary | ICD-10-CM

## 2017-06-27 DIAGNOSIS — I2510 Atherosclerotic heart disease of native coronary artery without angina pectoris: Secondary | ICD-10-CM

## 2017-06-27 LAB — BASIC METABOLIC PANEL
Anion gap: 9 (ref 5–15)
BUN: 33 mg/dL — ABNORMAL HIGH (ref 6–20)
CALCIUM: 9 mg/dL (ref 8.9–10.3)
CO2: 22 mmol/L (ref 22–32)
CREATININE: 2.03 mg/dL — AB (ref 0.44–1.00)
Chloride: 106 mmol/L (ref 101–111)
GFR, EST AFRICAN AMERICAN: 27 mL/min — AB (ref >60)
GFR, EST NON AFRICAN AMERICAN: 23 mL/min — AB (ref >60)
Glucose, Bld: 93 mg/dL (ref 65–99)
Potassium: 3.5 mmol/L (ref 3.5–5.1)
Sodium: 137 mmol/L (ref 135–145)

## 2017-06-27 LAB — GLUCOSE, CAPILLARY
GLUCOSE-CAPILLARY: 93 mg/dL (ref 65–99)
Glucose-Capillary: 111 mg/dL — ABNORMAL HIGH (ref 65–99)

## 2017-06-27 MED ORDER — ACETAMINOPHEN 325 MG PO TABS: 650.0000 mg | ORAL_TABLET | Freq: Four times a day (QID) | ORAL | Status: DC | PRN

## 2017-06-27 MED ORDER — METOPROLOL SUCCINATE ER 50 MG PO TB24: 50.0000 mg | ORAL_TABLET | Freq: Every day | ORAL | 3 refills | Status: DC

## 2017-06-27 MED ORDER — ENSURE ENLIVE PO LIQD
237.0000 mL | Freq: Two times a day (BID) | ORAL | Status: DC
  Administered 2017-06-27 (×2): 237 mL via ORAL
  Filled 2017-06-27 (×4): qty 237

## 2017-06-27 MED ORDER — ENSURE ENLIVE PO LIQD: 237.0000 mL | Freq: Two times a day (BID) | ORAL | 12 refills | Status: DC

## 2017-06-27 MED ORDER — NITROGLYCERIN 0.4 MG SL SUBL: 0.4000 mg | SUBLINGUAL_TABLET | SUBLINGUAL | 2 refills | Status: DC | PRN

## 2017-06-27 MED ORDER — TICAGRELOR 90 MG PO TABS: 90.0000 mg | ORAL_TABLET | Freq: Two times a day (BID) | ORAL | 0 refills | Status: DC

## 2017-06-27 NOTE — Progress Notes (Signed)
Discussed with pt, sons, and daughter MI, stent, Brilinta, restrictions, diet, ex, NTG, and CRPII. Pt is quite forgetful and there is concern about med compliance. Family is working on providing care for pt. It sounds like son Jiles Prows lives with her (but ? Reliability). Son, [place], is probably most reliable but does not live with her. They are interested in help at home. We discussed using an alarm and pill box to help with meds. Awaiting PT assessment today for balance, etc so I did not walk with her. She walked with me yesterday and was steady with RW. I encouraged her to use RW at home. Will refer to [place]. There is some concern re: ability to pay for Brilinta after the 30 days free. CM following pt. She understands how important Brilinta/antiplatelet is.  [place] CES, ACSM 9:32 AM 06/27/2017

## 2017-06-27 NOTE — Care Management Important Message (Signed)
Important Message  Patient Details  Name: Erin Jordan MRN: [id number] Date of Birth: Jul 12, 1945   Medicare Important Message Given:  Yes    Erenest Rasher, RN 06/27/2017, 3:02 PM

## 2017-06-27 NOTE — Progress Notes (Addendum)
Progress Note  Patient Name: Erin Jordan Date of Encounter: 06/27/2017  Primary Cardiologist: Rozann Lesches, MD   Subjective   Feeling well today. Denies any chest pain or dyspnea. No palpitations.   Inpatient Medications    Scheduled Meds: . amLODipine  10 mg Oral Daily  . aspirin  81 mg Oral Daily  . atorvastatin  40 mg Oral q1800  . heparin  5,000 Units Subcutaneous Q8H  . Influenza vac split quadrivalent PF  0.5 mL Intramuscular Tomorrow-1000  . insulin aspart  0-5 Units Subcutaneous QHS  . insulin aspart  0-9 Units Subcutaneous TID WC  . isosorbide dinitrate  30 mg Oral q morning - 10a  . metoprolol succinate  50 mg Oral Daily  . pneumococcal 23 valent vaccine  0.5 mL Intramuscular Tomorrow-1000  . sodium chloride flush  3 mL Intravenous Q12H  . ticagrelor  90 mg Oral BID   Continuous Infusions: . sodium chloride Stopped (06/25/17 1900)  . sodium chloride     PRN Meds: sodium chloride, acetaminophen, nitroGLYCERIN, ondansetron (ZOFRAN) IV, sodium chloride flush   Vital Signs    Vitals:   06/26/17 1900 06/26/17 1930 06/26/17 2000 06/27/17 0350  BP: (!) 117/92 115/65  (!) 115/58  Pulse:  87  78  Resp: 19 16 (!) 23 18  Temp:  98.4 F (36.9 C)  98.1 F (36.7 C)  TempSrc:  Oral  Oral  SpO2:  99%  100%  Weight:    126 lb 8.7 oz (57.4 kg)  Height:        Intake/Output Summary (Last 24 hours) at 06/27/2017 0724 Last data filed at 06/26/2017 1139 Gross per 24 hour  Intake -  Output 350 ml  Net -350 ml   Filed Weights   06/25/17 0531 06/26/17 0300 06/27/17 0350  Weight: 126 lb 11.2 oz (57.5 kg) 127 lb 13.9 oz (58 kg) 126 lb 8.7 oz (57.4 kg)    Telemetry    NSR- Personally Reviewed  ECG    Sinus rhythm. T wave inversion inferiorly. - Personally Reviewed  Physical Exam   GEN: Elderly female in no acute distress. Poor dentition Neck: No JVD, no carotid bruits Cardiac: RRR, no murmurs, rubs, or gallops.  Respiratory: Clear to auscultation  bilaterally, no wheezes/ rales/ rhonchi GI: NABS, Soft, nontender, non-distended  MS: No edema; No deformity. Groin without hematoma. Neuro:  Nonfocal, moving all extremities spontaneously Psych: Normal affect   Labs    Chemistry Recent Labs  Lab 06/25/17 0453 06/25/17 1507 06/25/17 2306 06/26/17 0355 06/27/17 0328  NA 140 142  --  139 137  K 3.2* 3.9  --  4.0 3.5  CL 108 109  --  106 106  CO2 23  --   --  23 22  GLUCOSE 113* 91  --  117* 93  BUN 23* 20  --  18 33*  CREATININE 1.33* 1.20* 1.24* 1.45* 2.03*  CALCIUM 8.8*  --   --  9.6 9.0  GFRNONAA 39*  --  42* 35* 23*  GFRAA 45*  --  49* 40* 27*  ANIONGAP 9  --   --  10 9     Hematology Recent Labs  Lab 06/25/17 0400 06/25/17 1507 06/25/17 2306 06/26/17 0355  WBC 6.2  --  7.1 7.0  RBC 3.92  --  4.18 4.44  HGB 11.9* 11.6* 12.9 13.5  HCT 35.4* 34.0* 37.2 39.6  MCV 90.3  --  89.0 89.2  MCH 30.4  --  30.9  30.4  MCHC 33.6  --  34.7 34.1  RDW 13.3  --  12.9 13.0  PLT 194  --  206 207    Cardiac Enzymes Recent Labs  Lab 06/24/17 0320 06/24/17 0622 06/24/17 1126 06/24/17 1617  TROPONINI 0.08* 0.31* 1.67* 2.74*   No results for input(s): TROPIPOC in the last 168 hours.   BNPNo results for input(s): BNP, PROBNP in the last 168 hours.   DDimer No results for input(s): DDIMER in the last 168 hours.   Radiology    CXR 06/24/17- CHEST - 2 VIEW  COMPARISON:  06/01/2011  FINDINGS: Post median sternotomy.The cardiomediastinal contours are unchanged, heart size is normal. The lungs are clear. Pulmonary vasculature is normal. No consolidation, pleural effusion, or pneumothorax. No acute osseous abnormalities are seen.  IMPRESSION: No acute pulmonary process.   Cardiac Studies   Echocardiogram 2013: Study Conclusions  - Left ventricle: The cavity size was normal.Mild tomoderate LVH with disproportionate septal thickening. Systolic function was normal. The estimated ejection fraction was in  the range of 60% to 65%. Wall motion was normal; there were no regional wall motion abnormalities. - Aortic valve: Mildly calcified annulus. - Mitral valve: Calcified annulus. - Right ventricle: The cavity size was normal. Wall thickness was mildly to moderately increased. - Atrial septum: No defect or patent foramen ovale was identified.  Procedures   CORONARY STENT INTERVENTION  LEFT HEART CATH AND CORS/GRAFTS ANGIOGRAPHY  Conclusion     Ost 1st Diag to 1st Diag lesion is 80% stenosed. THis is a long lesion nad is not grafted.  Prox LAD to Mid LAD lesion is 100% stenosed. LIMA to LAD is patent.  Lat 1st Mrg lesion is 95% stenosed.  Ost 1st Mrg to 1st Mrg lesion is 95% stenosed. SVG to OM is occluded.  Prox RCA lesion is 100% stenosed. SVG to PDA is occluded. This appears recent.  Ost 2nd Diag to 2nd Diag lesion is 100% stenosed. SVG to Diagonal with ostial 75% lesion.  A drug-eluting stent was successfully placed using a STENT SIERRA 4.00 X 15 MM.  Post intervention, there is a 0% residual stenosis.  The left ventricular ejection fraction is 50-55% by visual estimate.  There is no aortic valve stenosis.   Continue aggressive medical therapy.  Complex circumflex bifurcation lesion.  Increase antianginals.  Would likely lose the large lateral branch of the OM1 if PCI were performed.      Patient Profile     72 y.o. female with past medical history of CABG [place] 2006, HTN, DM type 2, CKD stage 3, presented to [facility] ED for chest pain and positive troponin. Transferred to [facility] for cardiac catheterization.   Assessment & Plan    1. NSTEMI: Troponin peak 2.74. - Cardiac cath demonstrated recent occlusion of SVG to RCA. This appears to be the culprit. There is CTO of SVG to OM. The OM has a severe bifurcation stenosis. Treatment of this percutaneously would likely result in occlusion of one of the branches. Medical therapy recommended. Also had stenosis in proximal SVG  to diagonal. This was successfully stented.  - Continue DAPT with ASA and Brilinta for one year.  - continue amlodipine.  - Imdur 30 mg daily added.  - Toprol CL 50 mg daily added.  2. HTN: BP improved after medication adjustment - Continue amlodipine,Toprol XL, and long acting nitrate.   3. DM type 2: goal A1C <7 - ISS inpatient - can resume glipizide at discharge  4. CKD stage 3:  Presented with Cr 1.88, up to 2.03 today post PCI  5. Hypokalemia: K 3.5 this AM - Replete  Plan: Will discuss with MD- ? DC today with f/u BMP in [place] Monday. Family and RN report she has limited mobility and has had falls at home- pt minimizes this. Will ask for PT evaluation.   For questions or updates, please contact [place] Please consult [email or web address] for contact info under Cardiology/STEMI.      Signed, Kerin Ransom, PA-C, [facility] 06/27/2017, 7:24 AM

## 2017-06-27 NOTE — Discharge Summary (Signed)
Discharge Summary    Patient ID: TAREA SKILLMAN,  MRN: [id number], DOB/AGE: 12-08-1945 72 y.o.  Admit date: 06/24/2017 Discharge date: 06/27/2017  Primary Care Provider: Patient, No Pcp Per Primary Cardiologist: Rozann Lesches, MD  Discharge Diagnoses    Principal Problem:   NSTEMI (non-ST elevated myocardial infarction) [facility]) Active Problems:   CAD -S/P PCI 06/26/17   Acute on chronic renal insufficiency   Hx of CABG- 2006   Stage 3 chronic kidney disease (HCC)   Dyslipidemia, goal LDL below 70   Diabetes mellitus, type 2 ([place])   Essential hypertension   Allergies No Known Allergies  Diagnostic Studies/Procedures    Coronary angiogram and PCI 06/26/17 _____________   History of Present Illness     72 y.o. female with PMH of CABG [place] 2006 seen in ER at [facility] 06/24/17 for chest pain and positive troponin, transferred to [facility] for further evaluation.   Hospital Course     Consultants: Physical therapy   72 y[place] with past medical history of CABG [place] 2006, HTN, DM type 2, CKD stage 3, presented to [facility] ED for chest pain and positive troponin. Transferred to [facility] for cardiac catheterization. Cardiac cath 06/26/17 demonstrated a recent occlusion of the SVG to RCA. This appears to be the culprit vessel. There was CTO of the SVG to OM. The OM had a severe bifurcation stenosis. Dr Irish Lack felt reatment of this percutaneously would likely result in occlusion of one of the branches. Medical therapy was recommended. The pt aso had stenosis in proximal SVG to diagonal. This lesion was successfully stented. The pt's SCr went to 2.0 at discharge. Her ACE and diuretic are on hold. She'll need a BMP Monday in [place]. We also asked for a PT consult prior to discharge as there was some question about her being weak and falling at home.   _____________  Discharge Vitals Blood pressure 137/70, pulse 81, temperature 97.8 F (36.6 C), temperature source Oral,  resp. rate 14, height 5\' 4"  (1.626 m), weight 126 lb 8.7 oz (57.4 kg), SpO2 99 %.  Filed Weights   06/25/17 0531 06/26/17 0300 06/27/17 0350  Weight: 126 lb 11.2 oz (57.5 kg) 127 lb 13.9 oz (58 kg) 126 lb 8.7 oz (57.4 kg)    Labs & Radiologic Studies    CBC Recent Labs    06/25/17 2306 06/26/17 0355  WBC 7.1 7.0  HGB 12.9 13.5  HCT 37.2 39.6  MCV 89.0 89.2  PLT 206 440   Basic Metabolic Panel Recent Labs    06/26/17 0355 06/27/17 0328  NA 139 137  K 4.0 3.5  CL 106 106  CO2 23 22  GLUCOSE 117* 93  BUN 18 33*  CREATININE 1.45* 2.03*  CALCIUM 9.6 9.0   Liver Function Tests No results for input(s): AST, ALT, ALKPHOS, BILITOT, PROT, ALBUMIN in the last 72 hours. No results for input(s): LIPASE, AMYLASE in the last 72 hours. Cardiac Enzymes Recent Labs    06/24/17 1126 06/24/17 1617  TROPONINI 1.67* 2.74*   BNP Invalid input(s): POCBNP D-Dimer No results for input(s): DDIMER in the last 72 hours. Hemoglobin A1C No results for input(s): HGBA1C in the last 72 hours. Fasting Lipid Panel No results for input(s): CHOL, HDL, LDLCALC, TRIG, CHOLHDL, LDLDIRECT in the last 72 hours. Thyroid Function Tests No results for input(s): TSH, T4TOTAL, T3FREE, THYROIDAB in the last 72 hours.  Invalid input(s): FREET3 _____________  Dg Chest 2 View  Result Date: 06/24/2017 CLINICAL DATA:  Left-sided chest pain. EXAM: CHEST - 2 VIEW COMPARISON:  06/01/2011 FINDINGS: Post median sternotomy.The cardiomediastinal contours are unchanged, heart size is normal. The lungs are clear. Pulmonary vasculature is normal. No consolidation, pleural effusion, or pneumothorax. No acute osseous abnormalities are seen. IMPRESSION: No acute pulmonary process. Electronically Signed   By: Jeb Levering M.D.   On: 06/24/2017 04:23   Disposition   Pt is being discharged home today in good condition.  Follow-up Plans & Appointments    Follow-up Information    Imogene Burn, PA-C Follow up on  07/14/2017.   Specialty:  Cardiology Why:  at 11:30am for your follow up appt.  Contact information: [place] [address] [phone]            Discharge Medications   Allergies as of 06/27/2017   No Known Allergies     Medication List    STOP taking these medications   hydrochlorothiazide 12.5 MG capsule Commonly known as:  MICROZIDE   lisinopril 20 MG tablet Commonly known as:  PRINIVIL,ZESTRIL   potassium chloride 10 MEQ tablet Commonly known as:  K-DUR,KLOR-CON     TAKE these medications   acetaminophen 325 MG tablet Commonly known as:  TYLENOL Take 2 tablets (650 mg total) by mouth every 6 (six) hours as needed for mild pain or headache.   amLODipine 10 MG tablet Commonly known as:  NORVASC TAKE 1 TABLET BY MOUTH ONCE A DAY.   aspirin 81 MG tablet Take 81 mg by mouth daily.   atorvastatin 40 MG tablet Commonly known as:  LIPITOR Take 1 tablet (40 mg total) by mouth daily at 6 PM. FOR HIGH CHOLESTEROL.   feeding supplement (ENSURE ENLIVE) Liqd Take 237 mLs by mouth 2 (two) times daily between meals.   glipiZIDE 10 MG tablet Commonly known as:  GLUCOTROL Take 10 mg by mouth daily.   isosorbide dinitrate 30 MG tablet Commonly known as:  ISORDIL Take 30 mg by mouth every morning.   metoprolol succinate 50 MG 24 hr tablet Commonly known as:  TOPROL-XL Take 1 tablet (50 mg total) by mouth daily. Take with or immediately following a meal.   nitroGLYCERIN 0.4 MG SL tablet Commonly known as:  NITROSTAT Place 1 tablet (0.4 mg total) under the tongue every 5 (five) minutes x 3 doses as needed for chest pain.   ticagrelor 90 MG Tabs tablet Commonly known as:  BRILINTA Take 1 tablet (90 mg total) by mouth 2 (two) times daily.        Aspirin prescribed at discharge?  Yes High Intensity Statin Prescribed? (Lipitor 40-80mg  or Crestor 20-40mg ): Yes Beta Blocker Prescribed? Yes For EF <40%, was ACEI/ARB Prescribed? No: AKI ADP Receptor  Inhibitor Prescribed? (i.e. Plavix etc.-Includes Medically Managed Patients): Yes For EF <40%, Aldosterone Inhibitor Prescribed? No: NA Was EF assessed during THIS hospitalization? Yes Was Cardiac Rehab II ordered? (Included Medically managed Patients): Yes   Outstanding Labs/Studies   BMP Monday  Duration of Discharge Encounter   Greater than 30 minutes including physician time.  Angelena Form PA 06/27/2017, 10:31 AM

## 2017-06-27 NOTE — Evaluation (Signed)
Physical Therapy Evaluation Patient Details Name: Erin Jordan MRN: [id number] DOB: 28-Dec-1945 Today's Date: 06/27/2017   History of Present Illness  Pt is a 72 y/o female admitted secondary to NSTEMI. Pt is s/p L heart cath with stent placement. PMH includes CABG, HTN, DM, and CKD 3.   Clinical Impression  Pt admitted secondary to problem above with deficits below. Pt presenting with increased weakness, muscle fatigue, and cognitive deficits. Required min guard to supervision with use of RW. Educated about safety with mobility at home and need for assist. Pt reports her son lives with her. Will continue to follow acutely to maximize functional mobility independence and safety.     Follow Up Recommendations Home health PT;Supervision/Assistance - 24 hour    Equipment Recommendations  Rolling walker with 5" wheels    Recommendations for Other Services OT consult     Precautions / Restrictions Precautions Precautions: Fall Precaution Comments: RN reports family stated one fall at home, however, pt stated she hadn't had any falls.  Restrictions Weight Bearing Restrictions: No      Mobility  Bed Mobility               General bed mobility comments: In chair upon entry   Transfers Overall transfer level: Needs assistance Equipment used: Rolling walker (2 wheeled) Transfers: Sit to/from Stand Sit to Stand: Min guard         General transfer comment: Min guard for safety. Demonstrated safe hand placement.   Ambulation/Gait Ambulation/Gait assistance: Min guard;Supervision Ambulation Distance (Feet): 200 Feet Assistive device: Rolling walker (2 wheeled) Gait Pattern/deviations: Step-through pattern;Decreased stride length Gait velocity: Decreased  Gait velocity interpretation: Below normal speed for age/gender General Gait Details: Slow, cautious gait. Pt reports LE weakness and fatigue which limited further distance. Pt reports increased comfort with use of RW and  would like to have one for home. No overt LOB noted with use of RW.   Stairs            Wheelchair Mobility    Modified Rankin (Stroke Patients Only)       Balance Overall balance assessment: Needs assistance Sitting-balance support: No upper extremity supported;Feet supported Sitting balance-Leahy Scale: Fair     Standing balance support: Bilateral upper extremity supported;During functional activity Standing balance-Leahy Scale: Poor Standing balance comment: Reliant on UE support.                              Pertinent Vitals/Pain Pain Assessment: No/denies pain    Home Living Family/patient expects to be discharged to:: Private residence Living Arrangements: Children Available Help at Discharge: Family;Available 24 hours/day Type of Home: House Home Access: Stairs to enter Entrance Stairs-Rails: (unsure ) Entrance Stairs-Number of Steps: 2(unsure but thinks 2 ) Home Layout: One level Home Equipment: Cane - single point      Prior Function Level of Independence: Independent         Comments: Reports she did not use anything to walk around with.      Hand Dominance   Dominant Hand: Right    Extremity/Trunk Assessment   Upper Extremity Assessment Upper Extremity Assessment: Generalized weakness    Lower Extremity Assessment Lower Extremity Assessment: Generalized weakness    Cervical / Trunk Assessment Cervical / Trunk Assessment: Normal  Communication   Communication: No difficulties  Cognition Arousal/Alertness: Awake/alert Behavior During Therapy: Flat affect Overall Cognitive Status: Impaired/Different from baseline Area of Impairment: Memory;Problem solving  Memory: Decreased recall of precautions;Decreased short-term memory       Problem Solving: Slow processing;Requires verbal cues General Comments: Pt reports memory deficits and pt had difficulty remembering information about home  environment.       General Comments General comments (skin integrity, edema, etc.): Educated about importance of having someone with pt at home given cognitive deficits. Also educated about importance of movement at home for recovery.    Exercises     Assessment/Plan    PT Assessment Patient needs continued PT services  PT Problem List Decreased strength;Decreased balance;Decreased activity tolerance;Decreased mobility;Decreased knowledge of use of DME;Decreased knowledge of precautions;Decreased cognition;Decreased safety awareness       PT Treatment Interventions DME instruction;Gait training;Stair training;Functional mobility training;Therapeutic activities;Therapeutic exercise;Balance training;Neuromuscular re-education;Cognitive remediation;Patient/family education    PT Goals (Current goals can be found in the Care Plan section)  Acute Rehab PT Goals Patient Stated Goal: to go home  PT Goal Formulation: With patient Time For Goal Achievement: 07/11/17 Potential to Achieve Goals: Good    Frequency Min 3X/week   Barriers to discharge        Co-evaluation               AM-PAC PT "6 Clicks" Daily Activity  Outcome Measure Difficulty turning over in bed (including adjusting bedclothes, sheets and blankets)?: A Little Difficulty moving from lying on back to sitting on the side of the bed? : Unable Difficulty sitting down on and standing up from a chair with arms (e.g., wheelchair, bedside commode, etc,.)?: Unable Help needed moving to and from a bed to chair (including a wheelchair)?: A Little Help needed walking in hospital room?: A Little Help needed climbing 3-5 steps with a railing? : A Lot 6 Click Score: 13    End of Session Equipment Utilized During Treatment: Gait belt Activity Tolerance: Patient tolerated treatment well Patient left: in chair;with call bell/phone within reach;with chair alarm set Nurse Communication: Mobility status PT Visit Diagnosis:  Unsteadiness on feet (R26.81);Muscle weakness (generalized) (M62.81)    Time: 2409-7353 PT Time Calculation (min) (ACUTE ONLY): 13 min   Charges:   PT Evaluation $PT Eval Low Complexity: 1 Low     PT G Codes:        Leighton Ruff, PT, DPT  Acute Rehabilitation Services  Pager: [phone]   Rudean Hitt 06/27/2017, 12:15 PM

## 2017-06-27 NOTE — Progress Notes (Signed)
Check BMP Monday- SCr 2.0 at discharge 3/22. ACE and diuretics on hold.   Kerin Ransom PA-C 06/27/2017 10:15 AM

## 2017-06-27 NOTE — Progress Notes (Addendum)
Discharge Planning: Spoke to pt at bedside and dtr. Pt children live in the home to assist with transportation and care as needed. Pt has RW and cane at home. Requested RW with seat. Contacted [facility] for [facility] PT and RW with seat. Contacted pt's previous PCP's office (PCP no longer in practice) and office manager will call to arrange an appt for her to see, Erin Jordan. Made pt aware to call office to arrange follow up appt. Jonnie Finner RN CCM Case Mgmt phone [phone]

## 2017-06-27 NOTE — Discharge Instructions (Addendum)
Have BMP (labwork) drawn at [facility] Monday 3/25       Coronary Angiogram With Stent, Care After This sheet gives you information about how to care for yourself after your procedure. Your health care provider may also give you more specific instructions. If you have problems or questions, contact your health care provider. What can I expect after the procedure? After your procedure, it is common to have:  Bruising in the area where a small, thin tube (catheter) was inserted. This usually fades within 1-2 weeks.  Blood collecting in the tissue (hematoma) that may be painful to the touch. It should usually decrease in size and tenderness within 1-2 weeks.  Follow these instructions at home: Insertion area care  Do not take baths, swim, or use a hot tub until your health care provider approves.  You may shower 24-48 hours after the procedure or as directed by your health care provider.  Follow instructions from your health care provider about how to take care of your incision. Make sure you: ? Wash your hands with soap and water before you change your bandage (dressing). If soap and water are not available, use hand sanitizer. ? Change your dressing as told by your health care provider. ? Leave stitches (sutures), skin glue, or adhesive strips in place. These skin closures may need to stay in place for 2 weeks or longer. If adhesive strip edges start to loosen and curl up, you may trim the loose edges. Do not remove adhesive strips completely unless your health care provider tells you to do that.  Remove the bandage (dressing) and gently wash the catheter insertion site with plain soap and water.  Pat the area dry with a clean towel. Do not rub the area, because that may cause bleeding.  Do not apply powder or lotion to the incision area.  Check your incision area every day for signs of infection. Check for: ? More redness, swelling, or pain. ? More fluid or  blood. ? Warmth. ? Pus or a bad smell. Activity  Do not drive for 24 hours if you were given a medicine to help you relax (sedative).  Do not lift anything that is heavier than 10 lb (4.5 kg) for 5 days after your procedure or as directed by your health care provider.  Ask your health care provider when it is okay for you: ? To return to work or school. ? To resume usual physical activities or sports. ? To resume sexual activity. Eating and drinking  Eat a heart-healthy diet. This should include plenty of fresh fruits and vegetables.  Avoid the following types of food: ? Food that is high in salt. ? Canned or highly processed food. ? Food that is high in saturated fat or sugar. ? [employer].  Limit alcohol intake to no more than 1 drink a day for non-pregnant women and 2 drinks a day for men. One drink equals 12 oz of beer, 5 oz of wine, or 1 oz of hard liquor. Lifestyle  Do not use any products that contain nicotine or tobacco, such as cigarettes and e-cigarettes. If you need help quitting, ask your health care provider.  Take steps to manage and control your weight.  Get regular exercise.  Manage your blood pressure.  Manage other health problems, such as diabetes. General instructions  Take over-the-counter and prescription medicines only as told by your health care provider. Blood thinners may be prescribed after your procedure to improve blood flow through the  stent.  If you need an MRI after your heart stent has been placed, be sure to tell the health care provider who orders the MRI that you have a heart stent.  Keep all follow-up visits as directed by your health care provider. This is important. Contact a health care provider if:  You have a fever.  You have chills.  You have increased bleeding from the catheter insertion area. Hold pressure on the area. Get help right away if:  You develop chest pain or shortness of breath.  You feel faint or you pass  out.  You have unusual pain at the catheter insertion area.  You have redness, warmth, or swelling at the catheter insertion area.  You have drainage (other than a small amount of blood on the dressing) from the catheter insertion area.  The catheter insertion area is bleeding, and the bleeding does not stop after 30 minutes of holding steady pressure on the area.  You develop bleeding from any other place, such as from your rectum. There may be bright red blood in your urine or stool, or it may appear as black, tarry stool. This information is not intended to replace advice given to you by your health care provider. Make sure you discuss any questions you have with your health care provider. Document Released: 10/12/2004 Document Revised: 12/21/2015 Document Reviewed: 12/21/2015 Elsevier Interactive Patient Education  [employer].

## 2017-07-01 ENCOUNTER — Other Ambulatory Visit (HOSPITAL_COMMUNITY)
Admission: RE | Admit: 2017-07-01 | Discharge: 2017-07-01 | Disposition: A | Discharge status: Home / Self Care | Payer: Medicare HMO | Source: Ambulatory Visit | Attending: Cardiology | Admitting: Cardiology

## 2017-07-01 DIAGNOSIS — N289 Disorder of kidney and ureter, unspecified: Secondary | ICD-10-CM | POA: Insufficient documentation

## 2017-07-01 DIAGNOSIS — N189 Chronic kidney disease, unspecified: Secondary | ICD-10-CM | POA: Diagnosis present

## 2017-07-01 LAB — BASIC METABOLIC PANEL
Anion gap: 10 (ref 5–15)
BUN: 49 mg/dL — ABNORMAL HIGH (ref 6–20)
CO2: 25 mmol/L (ref 22–32)
Calcium: 9.4 mg/dL (ref 8.9–10.3)
Chloride: 106 mmol/L (ref 101–111)
Creatinine, Ser: 1.83 mg/dL — ABNORMAL HIGH (ref 0.44–1.00)
GFR calc Af Amer: 30 mL/min — ABNORMAL LOW (ref >60)
GFR calc non Af Amer: 26 mL/min — ABNORMAL LOW (ref >60)
Glucose, Bld: 76 mg/dL (ref 65–99)
Potassium: 3.9 mmol/L (ref 3.5–5.1)
Sodium: 141 mmol/L (ref 135–145)

## 2017-07-04 ENCOUNTER — Telehealth: Payer: Self-pay

## 2017-07-04 NOTE — Telephone Encounter (Signed)
Patient contacted regarding discharge from [facility]  on 06/27/17.  Patient understands to follow up with provider Lenze on 07/14/17 at 1130 at [place]. Patient understands discharge instructions? yes Patient understands medications and regiment? yes Patient understands to bring all medications to this visit? yes

## 2017-07-09 NOTE — Progress Notes (Signed)
Cardiology Office Note    Date:  07/14/2017   ID:  Erin Jordan, DOB December 21, 1945, MRN [id number]  PCP:  Sandi Mealy, MD  Cardiologist: Rozann Lesches, MD  No chief complaint on file.   History of Present Illness:  Erin Jordan is a 72 y.o. female with history of CAD status post CABG in 2006, hypertension, DM type II, CKD stage III.  She had an NSTEMI 06/26/17 and underwent DES to the SVG to diagonal 2.  Patient also had SVG to the OM occluded, SVG to PDA occluded appeared recent, patent LIMA to the LAD.  Was a complex circumflex bifurcation lesion would likely lose large lateral branch of the OM 1 if PCI were performed.  Medical therapy recommended. LVEF 50-55%. Creatinine went up to 2.0 at discharge and her ACE inhibitor and diuretics were held.  Patient was put on my schedule as a TOC appointment.  She denies chest pain, palpitations, dyspnea, dyspnea on exertion, dizziness or presyncope.  She is only taking Brilinta and metoprolol.  She was sent home on these as well as aspirin 81 mg daily amlodipine 10 mg daily Imdur 30 mg daily glipizide 10 mg daily and Lipitor 40 mg daily.  She is not taking any of these.  She says the pharmacy did not give them to her.  Prior to hospitalization she knocked her pills behind a cabinet and lost all of them.  Her son says home health told her she needed to be taken her diabetes medicines and other things but the pharmacy did not give them to her.   Past Medical History:  Diagnosis Date  . Arthritis   . Chronic kidney disease, stage 3, mod decreased GFR (HCC)   . Coronary atherosclerosis of native coronary artery 2006   Multivessel status post CABG in [place]  . Diabetes mellitus, type 2 ([place])   . Essential hypertension, benign   . 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 Hyperlipidemia     Past Surgical History:  Procedure Laterality Date  . ABDOMINAL HYSTERECTOMY    . CORONARY ARTERY BYPASS GRAFT  2006   [place], [place]   . CORONARY STENT INTERVENTION N/A 06/25/2017   Procedure: CORONARY STENT INTERVENTION;  Surgeon: Jettie Booze, MD;  Location: [place] [facility];  Service: Cardiovascular;  Laterality: N/A;  . LEFT HEART CATH AND CORS/GRAFTS ANGIOGRAPHY N/A 06/25/2017   Procedure: LEFT HEART CATH AND CORS/GRAFTS ANGIOGRAPHY;  Surgeon: Jettie Booze, MD;  Location: [place] [facility];  Service: Cardiovascular;  Laterality: N/A;    Current Medications: Current Meds  Medication Sig  . acetaminophen (TYLENOL) 325 MG tablet Take 2 tablets (650 mg total) by mouth every 6 (six) hours as needed for mild pain or headache.  . metoprolol succinate (TOPROL-XL) 50 MG 24 hr tablet Take 1 tablet (50 mg total) by mouth daily. Take with or immediately following a meal.  . nitroGLYCERIN (NITROSTAT) 0.4 MG SL tablet Place 1 tablet (0.4 mg total) under the tongue every 5 (five) minutes x 3 doses as needed for chest pain.  . ticagrelor (BRILINTA) 90 MG TABS tablet Take 1 tablet (90 mg total) by mouth 2 (two) times daily.     Allergies:   Patient has no known allergies.   Social History   Socioeconomic History  . Marital status: Widowed    Spouse name: Not on file  . Number of children: Not on file  . Years of education: Not on file  . Highest  education level: Not on file  Occupational History  . Not on file  Social Needs  . Financial resource strain: Not on file  . Food insecurity:    Worry: Not on file    Inability: Not on file  . Transportation needs:    Medical: Not on file    Non-medical: Not on file  Tobacco Use  . Smoking status: Never Smoker  . Smokeless tobacco: Never Used  Substance and Sexual Activity  . Alcohol use: No  . Drug use: No  . Sexual activity: Never  Lifestyle  . Physical activity:    Days per week: Not on file    Minutes per session: Not on file  . Stress: Not on file  Relationships  . Social connections:    Talks on phone: Not on file    Gets together: Not on file      Attends religious service: Not on file    Active member of club or organization: Not on file    Attends meetings of clubs or organizations: Not on file    Relationship status: Not on file  Other Topics Concern  . Not on file  Social History Narrative   Lives in [place] with her daughter and grandchildren.     Family History:  The patient's family history includes Heart attack in her mother; Hypertension in her mother.   ROS:   Please see the history of present illness.    Review of Systems  Constitution: Negative.  HENT: Negative.   Eyes: Negative.   Cardiovascular: Negative.   Respiratory: Negative.   Hematologic/Lymphatic: Negative.   Musculoskeletal: Negative.  Negative for joint pain.  Gastrointestinal: Negative.   Genitourinary: Negative.   Neurological: Negative.    All other systems reviewed and are negative.   PHYSICAL EXAM:   VS:  BP 116/76   Pulse (!) 55   Ht 5\' 3"  (1.6 m)   Wt 121 lb (54.9 kg)   SpO2 99%   BMI 21.43 kg/m   Physical Exam  GEN: Thin, elderly in no acute distress  Neck: no JVD, carotid bruits, or masses Cardiac:RRR; positive S4 Respiratory:  clear to auscultation bilaterally, normal work of breathing GI: soft, nontender, nondistended, + BS Ext: Right arm at cath site without hematoma or hemorrhage, good radial brachial pulses, lower extremities without cyanosis, clubbing, or edema, Good distal pulses bilaterally Neuro:  Alert and Oriented x 3 Psych: euthymic mood, full affect  Wt Readings from Last 3 Encounters:  07/14/17 121 lb (54.9 kg)  06/27/17 126 lb 8.7 oz (57.4 kg)  04/23/17 155 lb (70.3 kg)      Studies/Labs Reviewed:   EKG:  EKG is not ordered today.  Recent Labs: 06/24/2017: Magnesium 2.2 06/26/2017: Hemoglobin 13.5; Platelets 207 07/01/2017: BUN 49; Creatinine, Ser 1.83; Potassium 3.9; Sodium 141   Lipid Panel    Component Value Date/Time   CHOL 200 06/02/2011 0431   TRIG 91 06/02/2011 0431   HDL 45 06/02/2011  0431   CHOLHDL 4.4 06/02/2011 0431   VLDL 18 06/02/2011 0431   LDLCALC 137 (H) 06/02/2011 0431    Additional studies/ records that were reviewed today include:   Cardiac cath [facility]/20/19[facility]  CARDIAC CATHETERIZATION  Order# [id number]  Reading physician: Jettie Booze, MD Ordering physician: Jettie Booze, MD Study date: 06/25/17  Physicians   Panel Physicians Referring Physician Case Authorizing Physician  Jettie Booze, MD (Primary)    Procedures   CORONARY STENT INTERVENTION  LEFT HEART  CATH AND CORS/GRAFTS ANGIOGRAPHY  Conclusion      Ost 1st Diag to 1st Diag lesion is 80% stenosed. THis is a long lesion nad is not grafted.  Prox LAD to Mid LAD lesion is 100% stenosed. LIMA to LAD is patent.  Lat 1st Mrg lesion is 95% stenosed.  Ost 1st Mrg to 1st Mrg lesion is 95% stenosed. SVG to OM is occluded.  Prox RCA lesion is 100% stenosed. SVG to PDA is occluded. This appears recent.  Ost 2nd Diag to 2nd Diag lesion is 100% stenosed. SVG to Diagonal with ostial 75% lesion.  A drug-eluting stent was successfully placed using a STENT SIERRA 4.00 X 15 MM.  Post intervention, there is a 0% residual stenosis.  The left ventricular ejection fraction is 50-55% by visual estimate.  There is no aortic valve stenosis.   Continue aggressive medical therapy.  Complex circumflex bifurcation lesion.  Increase antianginals.  Would likely lose the large lateral branch of the OM1 if PCI were performed.         ASSESSMENT:    1. CAD -S/P PCI 06/26/17   2. Essential hypertension   3. Stage 3 chronic kidney disease ([place])   4. Dyslipidemia, goal LDL below 70   5. Weight loss, unintentional      PLAN:  In order of problems listed above:  CAD status post CABG in 2006 followed by NSTEMI 06/26/17 treated with DES to the SVG to the diagonal 2.  Residual disease as described above recommend medical therapy.  Patient not having any symptoms and blood pressure is  low so I will not renew amlodipine or Imdur.  Will resume aspirin 81 mg daily and Lipitor 40 mg daily.  Reminded her that she must not miss any of her Brilinta doses.  Home health is waiting at her home to see her right now.  Follow-up with Dr. Domenic Polite in 6 weeks. Needs close f/u with confusion over meds.  Essential hypertension blood pressure is on the low side could be due to 34 pound weight loss since January  CKD stage III with rising creatinine to 2.0 after cath ACE inhibitor and diuretics held-we will not resume these as blood pressure is low normal.  Recheck renal function today.  Dyslipidemia restart Lipitor 40 mg daily and follow-up with lipid panel and LFTs in 6 weeks.  Diabetes mellitus not on glipizide we will give 30-day prescription and asked her to follow-up with her primary care concerning this.  Weight loss 34 pound weight loss since January unintentional.  Advised to follow-up with primary care.  Medication Adjustments/Labs and Tests Ordered: Current medicines are reviewed at length with the patient today.  Concerns regarding medicines are outlined above.  Medication changes, Labs and Tests ordered today are listed in the Patient Instructions below. Patient Instructions  Medication Instructions:  Your physician has recommended you make the following change in your medication:  Restart Lipitor 40 mg Daily Restart Aspirin 81 mg Daily  Restart Glipizide 10 mg Daily ( Have PCP to manage refills)    Labwork: Your physician recommends that you return for lab work in: [facility] today  Your physician recommends that you return for lab work in: [facility] in 6 Weeks     Testing/Procedures: NONE   Follow-Up: Your physician recommends that you schedule a follow-up appointment in: 6 Weeks with Dr. Domenic Polite.    Any Other Special Instructions Will Be Listed Below (If Applicable).     If you need a refill on your  cardiac medications before your next appointment, please call  your pharmacy.      Sumner Boast, PA-C  07/14/2017 12:03 PM    [facility] [place], [place], [place]  [address] Phone: [phone]; Fax: [phone]

## 2017-07-14 ENCOUNTER — Ambulatory Visit (INDEPENDENT_AMBULATORY_CARE_PROVIDER_SITE_OTHER): Payer: Medicare HMO | Admitting: Physician Assistant

## 2017-07-14 ENCOUNTER — Encounter: Payer: Self-pay | Admitting: Physician Assistant

## 2017-07-14 VITALS — BP 116/76 | HR 55 | Ht 63.0 in | Wt 121.0 lb

## 2017-07-14 DIAGNOSIS — I2510 Atherosclerotic heart disease of native coronary artery without angina pectoris: Secondary | ICD-10-CM | POA: Diagnosis not present

## 2017-07-14 DIAGNOSIS — I10 Essential (primary) hypertension: Secondary | ICD-10-CM

## 2017-07-14 DIAGNOSIS — E785 Hyperlipidemia, unspecified: Secondary | ICD-10-CM | POA: Diagnosis not present

## 2017-07-14 DIAGNOSIS — Z9861 Coronary angioplasty status: Secondary | ICD-10-CM | POA: Diagnosis not present

## 2017-07-14 DIAGNOSIS — N183 Chronic kidney disease, stage 3 unspecified: Secondary | ICD-10-CM

## 2017-07-14 DIAGNOSIS — R634 Abnormal weight loss: Secondary | ICD-10-CM | POA: Diagnosis not present

## 2017-07-14 MED ORDER — ASPIRIN EC 81 MG PO TBEC: 81.0000 mg | DELAYED_RELEASE_TABLET | Freq: Every day | ORAL | 3 refills | Status: DC

## 2017-07-14 MED ORDER — GLIPIZIDE 10 MG PO TABS: 10.0000 mg | ORAL_TABLET | Freq: Every day | ORAL | 0 refills | Status: DC

## 2017-07-14 MED ORDER — ATORVASTATIN CALCIUM 40 MG PO TABS: 40.0000 mg | ORAL_TABLET | Freq: Every day | ORAL | 3 refills | Status: DC

## 2017-07-14 NOTE — Patient Instructions (Signed)
Medication Instructions:  Your physician has recommended you make the following change in your medication:  Restart Lipitor 40 mg Daily Restart Aspirin 81 mg Daily  Restart Glipizide 10 mg Daily ( Have PCP to manage refills)    Labwork: Your physician recommends that you return for lab work in: [facility] today  Your physician recommends that you return for lab work in: [facility] in 6 Weeks     Testing/Procedures: NONE   Follow-Up: Your physician recommends that you schedule a follow-up appointment in: 6 Weeks with Dr. Domenic Polite.    Any Other Special Instructions Will Be Listed Below (If Applicable).     If you need a refill on your cardiac medications before your next appointment, please call your pharmacy.

## 2017-07-22 ENCOUNTER — Encounter (HOSPITAL_COMMUNITY): Payer: Self-pay | Admitting: Interventional Cardiology

## 2017-07-29 ENCOUNTER — Other Ambulatory Visit (HOSPITAL_COMMUNITY): Payer: Self-pay | Admitting: Family Medicine

## 2017-07-29 ENCOUNTER — Ambulatory Visit (HOSPITAL_COMMUNITY)
Admission: RE | Admit: 2017-07-29 | Discharge: 2017-07-29 | Disposition: A | Discharge status: Home / Self Care | Payer: Medicare HMO | Source: Ambulatory Visit | Attending: Family Medicine | Admitting: Family Medicine

## 2017-07-29 DIAGNOSIS — R634 Abnormal weight loss: Secondary | ICD-10-CM

## 2017-07-29 DIAGNOSIS — E010 Iodine-deficiency related diffuse (endemic) goiter: Secondary | ICD-10-CM | POA: Insufficient documentation

## 2017-07-29 DIAGNOSIS — E049 Nontoxic goiter, unspecified: Secondary | ICD-10-CM | POA: Diagnosis present

## 2017-07-29 DIAGNOSIS — Z1329 Encounter for screening for other suspected endocrine disorder: Secondary | ICD-10-CM

## 2017-08-08 NOTE — Patient Instructions (Signed)
Your procedure is scheduled on: 08/18/2017  Report to [facility] at  640   AM.  Call this number if you have problems the morning of surgery: [phone]   Do not eat food or drink liquids :After Midnight.      Take these medicines the morning of surgery with A SIP OF WATER: metoprolol.   Do not wear jewelry, make-up or nail polish.  Do not wear lotions, powders, or perfumes. You may wear deodorant.  Do not shave 48 hours prior to surgery.  Do not bring valuables to the hospital.  Contacts, dentures or bridgework may not be worn into surgery.  Leave suitcase in the car. After surgery it may be brought to your room.  For patients admitted to the hospital, checkout time is 11:00 AM the day of discharge.   Patients discharged the day of surgery will not be allowed to drive home.  :     Please read over the following fact sheets that you were given: Coughing and Deep Breathing, Surgical Site Infection Prevention, Anesthesia Post-op Instructions and Care and Recovery After Surgery    Cataract A cataract is a clouding of the lens of the eye. When a lens becomes cloudy, vision is reduced based on the degree and nature of the clouding. Many cataracts reduce vision to some degree. Some cataracts make people more near-sighted as they develop. Other cataracts increase glare. Cataracts that are ignored and become worse can sometimes look white. The white color can be seen through the pupil. CAUSES   Aging. However, cataracts may occur at any age, even in newborns.   Certain drugs.   Trauma to the eye.   Certain diseases such as diabetes.   Specific eye diseases such as chronic inflammation inside the eye or a sudden attack of a rare form of glaucoma.   Inherited or acquired medical problems.  SYMPTOMS   Gradual, progressive drop in vision in the affected eye.   Severe, rapid visual loss. This most often happens when trauma is the cause.  DIAGNOSIS  To detect a cataract, an eye doctor  examines the lens. Cataracts are best diagnosed with an exam of the eyes with the pupils enlarged (dilated) by drops.  TREATMENT  For an early cataract, vision may improve by using different eyeglasses or stronger lighting. If that does not help your vision, surgery is the only effective treatment. A cataract needs to be surgically removed when vision loss interferes with your everyday activities, such as driving, reading, or watching TV. A cataract may also have to be removed if it prevents examination or treatment of another eye problem. Surgery removes the cloudy lens and usually replaces it with a substitute lens (intraocular lens, IOL).  At a time when both you and your doctor agree, the cataract will be surgically removed. If you have cataracts in both eyes, only one is usually removed at a time. This allows the operated eye to heal and be out of danger from any possible problems after surgery (such as infection or poor wound healing). In rare cases, a cataract may be doing damage to your eye. In these cases, your caregiver may advise surgical removal right away. The vast majority of people who have cataract surgery have better vision afterward. HOME CARE INSTRUCTIONS  If you are not planning surgery, you may be asked to do the following:  Use different eyeglasses.   Use stronger or brighter lighting.   Ask your eye doctor about reducing your medicine dose  or changing medicines if it is thought that a medicine caused your cataract. Changing medicines does not make the cataract go away on its own.   Become familiar with your surroundings. Poor vision can lead to injury. Avoid bumping into things on the affected side. You are at a higher risk for tripping or falling.   Exercise extreme care when driving or operating machinery.   Wear sunglasses if you are sensitive to bright light or experiencing problems with glare.  SEEK IMMEDIATE MEDICAL CARE IF:   You have a worsening or sudden vision  loss.   You notice redness, swelling, or increasing pain in the eye.   You have a fever.  Document Released: 03/25/2005 Document Revised: 03/14/2011 Document Reviewed: 11/16/2010 [facility] Patient Information 2012 [place].PATIENT INSTRUCTIONS POST-ANESTHESIA  IMMEDIATELY FOLLOWING SURGERY:  Do not drive or operate machinery for the first twenty four hours after surgery.  Do not make any important decisions for twenty four hours after surgery or while taking narcotic pain medications or sedatives.  If you develop intractable nausea and vomiting or a severe headache please notify your doctor immediately.  FOLLOW-UP:  Please make an appointment with your surgeon as instructed. You do not need to follow up with anesthesia unless specifically instructed to do so.  WOUND CARE INSTRUCTIONS (if applicable):  Keep a dry clean dressing on the anesthesia/puncture wound site if there is drainage.  Once the wound has quit draining you may leave it open to air.  Generally you should leave the bandage intact for twenty four hours unless there is drainage.  If the epidural site drains for more than 36-48 hours please call the anesthesia department.  QUESTIONS?:  Please feel free to call your physician or the hospital operator if you have any questions, and they will be happy to assist you.

## 2017-08-13 ENCOUNTER — Encounter (HOSPITAL_COMMUNITY)
Admission: RE | Admit: 2017-08-13 | Discharge: 2017-08-13 | Disposition: A | Discharge status: Home / Self Care | Payer: Medicare HMO | Source: Ambulatory Visit | Attending: Ophthalmology | Admitting: Ophthalmology

## 2017-08-13 NOTE — Pre-Procedure Instructions (Signed)
Patient and daughter in for PAT for cataract extraction. Daughter states that patient had an MI in March 2019 and has not started her cardiac rehab yet. Spoke with Dr Hilaria Ota who states that she should wait and have her elective surgery 6 months after MI. Dr [facility] office notified. Daughter and patient verbalize understanding.

## 2017-08-18 ENCOUNTER — Encounter (HOSPITAL_COMMUNITY): Admission: RE | Payer: Self-pay | Source: Ambulatory Visit

## 2017-08-18 ENCOUNTER — Ambulatory Visit (HOSPITAL_COMMUNITY): Admission: RE | Admit: 2017-08-18 | Payer: Medicare HMO | Source: Ambulatory Visit | Admitting: Ophthalmology

## 2017-08-18 SURGERY — PHACOEMULSIFICATION, CATARACT, WITH IOL INSERTION
Anesthesia: Monitor Anesthesia Care | Site: Eye | Laterality: Right

## 2017-08-27 ENCOUNTER — Encounter: Payer: Self-pay | Admitting: Cardiology

## 2017-08-27 NOTE — Progress Notes (Deleted)
Cardiology Office Note  Date: 08/27/2017   ID: Erin Jordan, DOB 06-May-1945, MRN [id number]  PCP: Sandi Mealy, MD  Primary Cardiologist: Rozann Lesches, MD   No chief complaint on file.   History of Present Illness: Erin Jordan is a 72 y.o. female last seen by Ms. Vita Barley in April.  I reviewed interval records since I have not seen her since 2014.  She was hospitalized in March of this year with NSTEMI and underwent cardiac catheterization demonstrating significant graft disease.  LIMA to LAD was patent, however there was occlusion of the SVG to OM as well as SVG to PDA with 75% ostial stenosis involving the SVG to diagonal.  DES was placed in the SVG to diagonal and otherwise she was managed medically.  LVEF graded at 50 to 55% by left ventriculography.   Past Medical History:  Diagnosis Date  . Arthritis   . Chronic kidney disease, stage 3, mod decreased GFR (HCC)   . Coronary atherosclerosis of native coronary artery 2006   Multivessel status post CABG in [place], graft disease documented March 2019 with DES to SVG to diagonal  . Diabetes mellitus, type 2 ([place])   . Essential hypertension, benign   . 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 Hyperlipidemia     Past Surgical History:  Procedure Laterality Date  . ABDOMINAL HYSTERECTOMY    . CORONARY ARTERY BYPASS GRAFT  2006   [place], [place]  . CORONARY STENT INTERVENTION N/A 06/25/2017   Procedure: CORONARY STENT INTERVENTION;  Surgeon: Jettie Booze, MD;  Location: [facility];  Service: Cardiovascular;  Laterality: N/A;  . LEFT HEART CATH AND CORS/GRAFTS ANGIOGRAPHY N/A 06/25/2017   Procedure: LEFT HEART CATH AND CORS/GRAFTS ANGIOGRAPHY;  Surgeon: Jettie Booze, MD;  Location: [facility];  Service: Cardiovascular;  Laterality: N/A;    Current Outpatient Medications  Medication Sig Dispense Refill  . acetaminophen (TYLENOL) 325 MG tablet Take 2 tablets (650 mg  total) by mouth every 6 (six) hours as needed for mild pain or headache. (Patient not taking: Reported on 08/11/2017)    . aspirin EC 81 MG tablet Take 1 tablet (81 mg total) by mouth daily. 90 tablet 3  . atorvastatin (LIPITOR) 40 MG tablet Take 1 tablet (40 mg total) by mouth daily. 90 tablet 3  . glipiZIDE (GLUCOTROL) 10 MG tablet Take 1 tablet (10 mg total) by mouth daily before breakfast. 30 tablet 0  . metoprolol succinate (TOPROL-XL) 50 MG 24 hr tablet Take 1 tablet (50 mg total) by mouth daily. Take with or immediately following a meal. 90 tablet 3  . nitroGLYCERIN (NITROSTAT) 0.4 MG SL tablet Place 1 tablet (0.4 mg total) under the tongue every 5 (five) minutes x 3 doses as needed for chest pain. 25 tablet 2  . ticagrelor (BRILINTA) 90 MG TABS tablet Take 1 tablet (90 mg total) by mouth 2 (two) times daily. 60 tablet 0   No current facility-administered medications for this visit.    Allergies:  Patient has no known allergies.   Social History: The patient  reports that she has never smoked. She has never used smokeless tobacco. She reports that she does not drink alcohol or use drugs.   Family History: The patient's family history includes Heart attack in her mother; Hypertension in her mother.   ROS:  Please see the history of present illness. Otherwise, complete review of systems is positive for {NONE DEFAULTED:18576::"none"}.  All other  systems are reviewed and negative.   Physical Exam: VS:  There were no vitals taken for this visit., BMI There is no height or weight on file to calculate BMI.  Wt Readings from Last 3 Encounters:  08/13/17 121 lb (54.9 kg)  07/14/17 121 lb (54.9 kg)  06/27/17 126 lb 8.7 oz (57.4 kg)    General: Patient appears comfortable at rest. HEENT: Conjunctiva and lids normal, oropharynx clear with moist mucosa. Neck: Supple, no elevated JVP or carotid bruits, no thyromegaly. Lungs: Clear to auscultation, nonlabored breathing at rest. Cardiac: Regular  rate and rhythm, no S3 or significant systolic murmur, no pericardial rub. Abdomen: Soft, nontender, no hepatomegaly, bowel sounds present, no guarding or rebound. Extremities: No pitting edema, distal pulses 2+. Skin: Warm and dry. Musculoskeletal: No kyphosis. Neuropsychiatric: Alert and oriented x3, affect grossly appropriate.  ECG: I personally reviewed the tracing from 06/26/2017 which showed sinus tachycardia with nonspecific ST-T changes.  Recent Labwork: 06/24/2017: Magnesium 2.2 06/26/2017: Hemoglobin 13.5; Platelets 207 07/01/2017: BUN 49; Creatinine, Ser 1.83; Potassium 3.9; Sodium 141     Component Value Date/Time   CHOL 200 06/02/2011 0431   TRIG 91 06/02/2011 0431   HDL 45 06/02/2011 0431   CHOLHDL 4.4 06/02/2011 0431   VLDL 18 06/02/2011 0431   LDLCALC 137 (H) 06/02/2011 0431    Other Studies Reviewed Today:  Cardiac catheterization and PCI 06/25/2017:  Ost 1st Diag to 1st Diag lesion is 80% stenosed. THis is a long lesion nad is not grafted.  Prox LAD to Mid LAD lesion is 100% stenosed. LIMA to LAD is patent.  Lat 1st Mrg lesion is 95% stenosed.  Ost 1st Mrg to 1st Mrg lesion is 95% stenosed. SVG to OM is occluded.  Prox RCA lesion is 100% stenosed. SVG to PDA is occluded. This appears recent.  Ost 2nd Diag to 2nd Diag lesion is 100% stenosed. SVG to Diagonal with ostial 75% lesion.  A drug-eluting stent was successfully placed using a STENT SIERRA 4.00 X 15 MM.  Post intervention, there is a 0% residual stenosis.  The left ventricular ejection fraction is 50-55% by visual estimate.  There is no aortic valve stenosis.   Continue aggressive medical therapy.  Complex circumflex bifurcation lesion.  Increase antianginals.  Would likely lose the large lateral branch of the OM1 if PCI were performed.   Assessment and Plan:   Current medicines were reviewed with the patient today.  No orders of the defined types were placed in this  encounter.   Disposition:  Signed, Erin Sark, MD, [facility] 08/27/2017 8:38 AM    [facility]. [address] [place], [place] [address] Phone: [phone]; Fax: [phone]

## 2017-08-28 ENCOUNTER — Ambulatory Visit: Payer: Medicare HMO | Admitting: Cardiology

## 2017-08-28 DIAGNOSIS — R0989 Other specified symptoms and signs involving the circulatory and respiratory systems: Secondary | ICD-10-CM

## 2017-09-02 ENCOUNTER — Other Ambulatory Visit: Payer: Self-pay | Admitting: Physician Assistant

## 2017-09-08 ENCOUNTER — Other Ambulatory Visit: Payer: Self-pay | Admitting: "Endocrinology

## 2017-09-11 ENCOUNTER — Ambulatory Visit: Payer: Self-pay | Admitting: "Endocrinology

## 2017-10-14 ENCOUNTER — Encounter: Payer: Self-pay | Admitting: "Endocrinology

## 2017-10-14 ENCOUNTER — Ambulatory Visit (INDEPENDENT_AMBULATORY_CARE_PROVIDER_SITE_OTHER): Payer: Medicare HMO | Admitting: "Endocrinology

## 2017-10-14 VITALS — BP 174/84 | HR 80 | Ht 63.0 in | Wt 125.0 lb

## 2017-10-14 DIAGNOSIS — E042 Nontoxic multinodular goiter: Secondary | ICD-10-CM | POA: Insufficient documentation

## 2017-10-14 DIAGNOSIS — E0520 Thyrotoxicosis with toxic multinodular goiter without thyrotoxic crisis or storm: Secondary | ICD-10-CM | POA: Diagnosis not present

## 2017-10-14 MED ORDER — METHIMAZOLE 5 MG PO TABS: 5.0000 mg | ORAL_TABLET | Freq: Every day | ORAL | 2 refills | Status: DC

## 2017-10-14 NOTE — Progress Notes (Signed)
Endocrinology Consult Note                                            10/14/2017, 1:01 PM   Subjective:    Patient ID: Erin Jordan, female    DOB: 11-27-1945, PCP Sandi Mealy, MD   Past Medical History:  Diagnosis Date  . Arthritis   . Chronic kidney disease, stage 3, mod decreased GFR (HCC)   . Coronary atherosclerosis of native coronary artery 2006   Multivessel status post CABG in [place], graft disease documented March 2019 with DES to SVG to diagonal  . Diabetes mellitus, type 2 ([place])   . Essential hypertension, benign   . 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 Hyperlipidemia    Past Surgical History:  Procedure Laterality Date  . ABDOMINAL HYSTERECTOMY    . CORONARY ARTERY BYPASS GRAFT  2006   [place], [place]  . CORONARY STENT INTERVENTION N/A 06/25/2017   Procedure: CORONARY STENT INTERVENTION;  Surgeon: Jettie Booze, MD;  Location: [facility];  Service: Cardiovascular;  Laterality: N/A;  . LEFT HEART CATH AND CORS/GRAFTS ANGIOGRAPHY N/A 06/25/2017   Procedure: LEFT HEART CATH AND CORS/GRAFTS ANGIOGRAPHY;  Surgeon: Jettie Booze, MD;  Location: [facility];  Service: Cardiovascular;  Laterality: N/A;   Social History   Socioeconomic History  . Marital status: Widowed    Spouse name: Not on file  . Number of children: Not on file  . Years of education: Not on file  . Highest education level: Not on file  Occupational History  . Not on file  Social Needs  . Financial resource strain: Not on file  . Food insecurity:    Worry: Not on file    Inability: Not on file  . Transportation needs:    Medical: Not on file    Non-medical: Not on file  Tobacco Use  . Smoking status: Never Smoker  . Smokeless tobacco: Never Used  Substance and Sexual Activity  . Alcohol use: No  . Drug use: No  . Sexual activity: Never  Lifestyle  . Physical activity:    Days per week: Not on file    Minutes per session:  Not on file  . Stress: Not on file  Relationships  . Social connections:    Talks on phone: Not on file    Gets together: Not on file    Attends religious service: Not on file    Active member of club or organization: Not on file    Attends meetings of clubs or organizations: Not on file    Relationship status: Not on file  Other Topics Concern  . Not on file  Social History Narrative   Lives in [place] with her daughter and grandchildren.   Outpatient Encounter Medications as of 10/14/2017  Medication Sig  . acetaminophen (TYLENOL) 325 MG tablet Take 2 tablets (650 mg total) by mouth every 6 (six) hours as needed for mild pain or headache. (Patient not taking: Reported on 08/11/2017)  . aspirin EC 81 MG tablet Take 1 tablet (81 mg total) by mouth daily.  Marland Kitchen atorvastatin (LIPITOR) 40 MG tablet Take 1 tablet (40 mg total) by mouth daily.  Marland Kitchen glipiZIDE (GLUCOTROL) 10 MG tablet Take 1 tablet (10 mg total) by mouth daily before breakfast.  . methimazole (TAPAZOLE) 5  MG tablet Take 1 tablet (5 mg total) by mouth daily.  . metoprolol succinate (TOPROL-XL) 50 MG 24 hr tablet Take 1 tablet (50 mg total) by mouth daily. Take with or immediately following a meal.  . nitroGLYCERIN (NITROSTAT) 0.4 MG SL tablet Place 1 tablet (0.4 mg total) under the tongue every 5 (five) minutes x 3 doses as needed for chest pain.  . ticagrelor (BRILINTA) 90 MG TABS tablet Take 1 tablet (90 mg total) by mouth 2 (two) times daily.   No facility-administered encounter medications on file as of 10/14/2017.    ALLERGIES: No Known Allergies  VACCINATION STATUS: Immunization History  Administered Date(s) Administered  . Influenza Split 06/03/2011  . Influenza, High Dose Seasonal PF 06/27/2017  . Pneumococcal Polysaccharide-23 06/27/2017    HPI Erin Jordan is 72 y.o. female who presents today with a medical history as above. she is being seen in consultation for multinodular goiter requested by Sandi Mealy, MD.  -She has known about having goiter for several years.  She did not seek medical attention for it until March of 05/2017 when she was brought by EMS to ER with chest pain.  She was subsequently found to have coronary artery disease which required coronary artery bypass graft.  In the process, she was diagnosed with multinodular goiter confirmed by ultrasound on July 29, 2017. -She was also found to have elevated free T4 associated with suppressed TSH. -On further interview, she admits to have been dealing with choking sensation, on and off dysphagia/odynophagia, and change in her voice.  -She denies family history of thyroid dysfunction nor thyroid cancer.  She denies any history of neck radiation. -She is not currently on antithyroid medications nor thyroid hormone supplements.  -She reports weight loss of 30 pounds since March 2019.  She is currently on metoprolol 50 mg p.o. daily.  She denies palpitations.    Review of Systems  Constitutional: + weight loss, + fatigue, + subjective hyperthermia. Eyes: no blurry vision, no xerophthalmia ENT: no sore throat, +nodules palpated in thyroid, + dysphagia, +odynophagia, + hoarseness Cardiovascular: no Chest Pain, no Shortness of Breath, no palpitations, no leg swelling Respiratory: no cough, no SOB Gastrointestinal: no Nausea/Vomiting/Diarhhea Musculoskeletal: no muscle/joint aches Skin: no rashes Neurological: no tremors, no numbness, no tingling, no dizziness Psychiatric: no depression, no anxiety  Objective:    BP (!) 174/84   Pulse 80   Ht 5\' 3"  (1.6 m)   Wt 125 lb (56.7 kg)   BMI 22.14 kg/m   Wt Readings from Last 3 Encounters:  10/14/17 125 lb (56.7 kg)  08/13/17 121 lb (54.9 kg)  07/14/17 121 lb (54.9 kg)    Physical Exam  Constitutional:  + appropriate weight for height, not in acute distress, normal state of mind Eyes: PERRLA, EOMI, no exophthalmos ENT: moist mucous membranes, + huge nodular thyromegaly, no cervical  lymphadenopathy Cardiovascular: normal precordial activity, Regular Rate and Rhythm, no Murmur/Rubs/Gallops Respiratory:  adequate breathing efforts, no gross chest deformity, Clear to auscultation bilaterally Gastrointestinal: abdomen soft, Non -tender, No distension, Bowel Sounds present Musculoskeletal: no gross deformities, strength intact in all four extremities Skin: moist, warm, no rashes Neurological: no tremor with outstretched hands, Deep tendon reflexes normal in all four extremities.  CMP ( most recent) CMP     Component Value Date/Time   NA 141 07/01/2017 1500   K 3.9 07/01/2017 1500   CL 106 07/01/2017 1500   CO2 25 07/01/2017 1500   GLUCOSE 76 07/01/2017 1500  BUN 49 (H) 07/01/2017 1500   CREATININE 1.83 (H) 07/01/2017 1500   CALCIUM 9.4 07/01/2017 1500   PROT 8.2 04/14/2012 1613   PROT 7.5 04/14/2012 1613   ALBUMIN 4.5 04/14/2012 1613   AST 17 04/14/2012 1613   ALT 18 04/14/2012 1613   ALKPHOS 137 (H) 04/14/2012 1613   BILITOT 0.5 04/14/2012 1613   GFRNONAA 26 (L) 07/01/2017 1500   GFRAA 30 (L) 07/01/2017 1500     Diabetic Labs (most recent): Lab Results  Component Value Date   HGBA1C 6.7 (H) 06/03/2011   HGBA1C 7.0 (H) 06/01/2011     Lipid Panel ( most recent) Lipid Panel     Component Value Date/Time   CHOL 200 06/02/2011 0431   TRIG 91 06/02/2011 0431   HDL 45 06/02/2011 0431   CHOLHDL 4.4 06/02/2011 0431   VLDL 18 06/02/2011 0431   LDLCALC 137 (H) 06/02/2011 0431   July 29, 2017 labs showed TSH 0.034 suppressed, free T4 elevated at 2.0 Ultrasound of her thyroid on July 29, 2017 showed right lobe measuring 12 cm x 10.2 cm x 3.1 cm-with 2 large nodules measuring 2.7 cm and 2.4 cm.  Left lobe measures 12.3 cm x 5.3 cm x 5.9 cm with 2 large nodules measuring 3.3 cm and 2.1 cm. -All of these nodules were reported to be suspicious and recommended for biopsy.   Assessment & Plan:   1. Toxic multinodular goiter   - BYRDIE MIYAZAKI  is being  seen at a kind request of Barrino, Nicole Kindred, MD. - I have reviewed her available thyroid records and clinically evaluated the patient. 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 -She has 4 large nodules in bilateral thyroid lobes all recommended for fine-needle aspiration. -I gave her choices including biopsy and watchful waiting or direct total thyroidectomy with subsequent thyroid hormone replacement. -She agrees with my recommendation of direct total thyroidectomy for several reasons including compressive neck symptoms.  -I discussed and referred her to Dr. Aviva Signs for same.  She will not need fine-needle aspiration of thyroid nodules if she is going for thyroidectomy. -She will return in 8 weeks with repeat thyroid function test for  thyroid hormone dose adjustment.   - I did not initiate any new prescriptions today. - I advised her  to maintain close follow up with Sandi Mealy, MD for primary care needs.   Follow up plan: Return in about 8 weeks (around 12/09/2017) for follow up with labs after surgery.   Glade Lloyd, MD [facility] [address] [place], [place] [address] Phone: [phone]  Fax: [phone]     10/14/2017, 1:01 PM  This note was partially dictated with voice recognition software. Similar sounding words can be transcribed inadequately or may not  be corrected upon review.

## 2017-11-06 ENCOUNTER — Ambulatory Visit (INDEPENDENT_AMBULATORY_CARE_PROVIDER_SITE_OTHER): Payer: Medicare HMO | Admitting: General Surgery

## 2017-11-06 ENCOUNTER — Encounter: Payer: Self-pay | Admitting: General Surgery

## 2017-11-06 VITALS — BP 185/86 | HR 74 | Temp 97.3°F | Resp 18 | Wt 128.0 lb

## 2017-11-06 DIAGNOSIS — E049 Nontoxic goiter, unspecified: Secondary | ICD-10-CM | POA: Diagnosis not present

## 2017-11-06 NOTE — Patient Instructions (Signed)
Goiter A goiter is an enlarged thyroid gland. The thyroid gland is located in the lower front of the neck. The gland produces hormones that regulate mood, body temperature, pulse rate, and digestion. Most goiters are painless and are not a cause for serious concern. Goiters and conditions that cause goiters can be treated, if necessary. What are the causes? Causes of this condition include:  Diseases that attack healthy cells in your body (autoimmune diseases) and affect your thyroid function, such as: ? Graves disease. This causes too much thyroid hormone to be produced and it makes your thyroid overly active (hyperthyroidism). ? Hashimoto disease. This type of inflammation of the thyroid (thyroiditis) causes too little thyroid hormone to be produced and it makes your thyroid not active enough (hypothyroidism).  Other conditions that cause thyroiditis.  Nodular goiter. This means that there are one or more small growths on your thyroid. These can create too much thyroid hormone.  Pregnancy.  Thyroid cancer. This is rare.  Certain medicines.  Radiation exposure.  Iodine deficiency.  In some cases, the cause may not be known (idiopathic). What increases the risk? This condition is more likely to develop in:  People who have a family history of goiter.  Women.  People who do not get enough iodine in their diet.  People who are older than 84.  People who smoke tobacco.  What are the signs or symptoms? Common symptoms of this condition include:  Swelling in the lower part of the neck. This swelling can range from a very small bump to a large lump.  A tight feeling in the throat.  A hoarse voice.  Other symptoms include:  Coughing.  Wheezing.  Difficulty swallowing.  Difficulty breathing.  Bulging neck veins.  Dizziness.  In some cases, there are no symptoms and thyroid hormone levels may be normal. When a goiter is the result of hyperthyroidism, symptoms may  also include:  Nervousness or restlessness.  Inability to tolerate heat.  Unexplained weight loss.  Diarrhea.  Change in the texture of hair or skin.  Changes in heart beat, such as skipped beats, extra beats, or a rapid heart rate.  Loss of menstruation.  Shaky hands.  Increased appetite.  Sleep problems.  When a goiter is the result of hypothyroidism, symptoms may also include:  Feeling like you have no energy (lethargy).  Inability to tolerate cold.  Weight gain that is not explained by a change in diet or exercise habits.  Dry skin.  Coarse hair.  Menstrual irregularity.  Constipation.  Sadness or depression.  How is this diagnosed? This condition may be diagnosed with a medical history and physical exam. You may also have other tests, including:  Blood tests to check thyroid function.  Imaging tests, such as: ? Ultrasonography. ? CT scan. ? MRI. ? Thyroid scan. You will be given a safe radioactive injection, then images will be taken of your thyroid.  Tissue sample (biopsy) of the goiter or any nodules. This checks to see if the goiter or nodules are cancerous.  How is this treated? Treatment for this condition depends on the cause. Treatment may include:  Medicines to control your thyroid.  Anti-inflammatory or steroid medicines, if inflammation is the cause.  Iodine supplements or changes in diet, if the goiter is caused by iodine deficiency.  Radiation therapy.  Surgery to remove your thyroid.  In some cases, no treatment is necessary, and your health care provider will monitor your condition at regular checkups. Follow these instructions at  home:  Follow recommendations from your health care provider for any changes to your diet.  Take over-the-counter and prescription medicines only as told by your health care provider.  Do not use any tobacco products, including cigarettes, chewing tobacco, or e-cigarettes. If you need help quitting,  ask your health care provider.  Keep all follow-up appointments as told by your health care provider. This is important. Contact a health care provider if:  Your symptoms do not get better with treatment. Get help right away if:  You develop sudden, unexplained confusion or other mental changes.  You have nausea, vomiting, or diarrhea.  You develop a fever.  Your skin or the whites of your eyes appear yellow (jaundice).  You develop chest pain.  You have trouble breathing or swallowing.  You suddenly become very weak.  You experience extreme restlessness. This information is not intended to replace advice given to you by your health care provider. Make sure you discuss any questions you have with your health care provider. Document Released: 09/12/2009 Document Revised: 10/13/2015 Document Reviewed: 03/21/2014 Elsevier Interactive Patient Education  [employer].

## 2017-11-06 NOTE — Progress Notes (Signed)
Erin Jordan; [id number]; [birth year]   HPI Patient is a 72 year old black female who was referred to my care by Dr. Dorris Fetch for evaluation and treatment of a multinodular goiter.  Patient has had the goiter for many years, but did not seek any medical attention for until earlier this year.  She underwent coronary stent placement in March of this year.  She has been on Brilinta since that time.  While she was at the hospital, she was noted to have a large goiter.  She currently states that she is swallowing without difficulties.  She denies any compressive symptoms on her trachea while lying down.  She denies any family history of thyroid cancer or radiation to the neck.  She currently has 0 out of 10 neck pain. Past Medical History:  Diagnosis Date  . Arthritis   . Chronic kidney disease, stage 3, mod decreased GFR (HCC)   . Coronary atherosclerosis of native coronary artery 2006   Multivessel status post CABG in [place], graft disease documented March 2019 with DES to SVG to diagonal  . Diabetes mellitus, type 2 ([place])   . Essential hypertension, benign   . 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 Hyperlipidemia     Past Surgical History:  Procedure Laterality Date  . ABDOMINAL HYSTERECTOMY    . CORONARY ARTERY BYPASS GRAFT  2006   [place], [place]  . CORONARY STENT INTERVENTION N/A 06/25/2017   Procedure: CORONARY STENT INTERVENTION;  Surgeon: Jettie Booze, MD;  Location: [facility];  Service: Cardiovascular;  Laterality: N/A;  . LEFT HEART CATH AND CORS/GRAFTS ANGIOGRAPHY N/A 06/25/2017   Procedure: LEFT HEART CATH AND CORS/GRAFTS ANGIOGRAPHY;  Surgeon: Jettie Booze, MD;  Location: [facility];  Service: Cardiovascular;  Laterality: N/A;    Family History  Problem Relation Age of Onset  . Heart attack Mother   . Hypertension Mother     Current Outpatient Medications on File Prior to Visit  Medication Sig Dispense Refill  . aspirin EC 81 MG  tablet Take 1 tablet (81 mg total) by mouth daily. 90 tablet 3  . glipiZIDE (GLUCOTROL) 10 MG tablet Take 1 tablet (10 mg total) by mouth daily before breakfast. 30 tablet 0  . methimazole (TAPAZOLE) 5 MG tablet Take 1 tablet (5 mg total) by mouth daily. 30 tablet 2  . metoprolol succinate (TOPROL-XL) 50 MG 24 hr tablet Take 1 tablet (50 mg total) by mouth daily. Take with or immediately following a meal. 90 tablet 3  . nitroGLYCERIN (NITROSTAT) 0.4 MG SL tablet Place 1 tablet (0.4 mg total) under the tongue every 5 (five) minutes x 3 doses as needed for chest pain. 25 tablet 2  . atorvastatin (LIPITOR) 40 MG tablet Take 1 tablet (40 mg total) by mouth daily. 90 tablet 3  . ticagrelor (BRILINTA) 90 MG TABS tablet Take 1 tablet (90 mg total) by mouth 2 (two) times daily. (Patient not taking: Reported on 11/06/2017) 60 tablet 0   No current facility-administered medications on file prior to visit.     No Known Allergies  Social History   Substance and Sexual Activity  Alcohol Use No    Social History   Tobacco Use  Smoking Status Never Smoker  Smokeless Tobacco Never Used    Review of Systems  Constitutional: Positive for malaise/fatigue.  HENT: Positive for sinus pain and sore throat.   Eyes: Positive for blurred vision and double vision.  Respiratory: Positive for cough, shortness of breath  and wheezing.   Cardiovascular: Negative.   Gastrointestinal: Positive for heartburn and nausea.  Genitourinary: Positive for frequency and urgency.  Musculoskeletal: Positive for joint pain and neck pain.  Skin: Negative.   Neurological: Negative.   Endo/Heme/Allergies: Bruises/bleeds easily.  Psychiatric/Behavioral: Negative.     Objective   Vitals:   11/06/17 1036  BP: (!) 185/86  Pulse: 74  Resp: 18  Temp: (!) 97.3 F (36.3 C)    Physical Exam  Constitutional: She is oriented to person, place, and time.  HENT:  Head: Normocephalic and atraumatic.  Neck: Normal range of  motion. Neck supple. No tracheal deviation present. Thyromegaly present.  Significantly enlarged thyroid gland, left greater than right, some nodularity present.  The left lobe encompasses a significant portion of the left side of the neck.  It extends down to the clavicle.  The right lobe is not as pronounced.  I could not palpate the carotid arteries.  No lymphadenopathy appreciable.  Cardiovascular: Normal rate, regular rhythm and normal heart sounds. Exam reveals no gallop, no S3, no S4 and no friction rub.  No murmur heard. Pulmonary/Chest: Effort normal and breath sounds normal. No stridor. No respiratory distress. She has no wheezes. She has no rales.  Neurological: She is alert and oriented to person, place, and time.  Skin: Skin is warm and dry.   Dr. Liliane Channel notes reviewed.  Ultrasound report reviewed, both lobes greater than 12 cm in size.  Multiple nodules present TSH level from outside lab at 0.034, suppressed Free T4 elevated at 2.0 Assessment  Multinodular goiter, early compressive symptoms Plan   I agree that she requires a total thyroidectomy given the extensive size and nodularity of the thyroid gland.  Her surgical care is complicated by her recent coronary stent placement in March of this year.  She is anticoagulated.  Given all these factors, I feel that her surgery needs to be done at a larger facility with more perioperative support.  I did explain this to the patient and daughter.  They understand.  Will refer to Armandina Gemma of [facility] for further evaluation and treatment.

## 2017-11-12 ENCOUNTER — Ambulatory Visit: Payer: Self-pay | Admitting: "Endocrinology

## 2017-12-02 ENCOUNTER — Other Ambulatory Visit: Payer: Self-pay | Admitting: Surgery

## 2017-12-02 DIAGNOSIS — E0590 Thyrotoxicosis, unspecified without thyrotoxic crisis or storm: Secondary | ICD-10-CM

## 2017-12-02 DIAGNOSIS — E042 Nontoxic multinodular goiter: Secondary | ICD-10-CM

## 2017-12-02 DIAGNOSIS — E049 Nontoxic goiter, unspecified: Secondary | ICD-10-CM

## 2017-12-03 ENCOUNTER — Encounter: Payer: Self-pay | Admitting: Cardiology

## 2017-12-03 ENCOUNTER — Ambulatory Visit: Payer: Medicare HMO | Admitting: Cardiology

## 2017-12-03 VITALS — BP 122/72 | HR 89 | Ht 61.0 in | Wt 122.0 lb

## 2017-12-03 DIAGNOSIS — E782 Mixed hyperlipidemia: Secondary | ICD-10-CM

## 2017-12-03 DIAGNOSIS — E119 Type 2 diabetes mellitus without complications: Secondary | ICD-10-CM

## 2017-12-03 DIAGNOSIS — Z01810 Encounter for preprocedural cardiovascular examination: Secondary | ICD-10-CM

## 2017-12-03 DIAGNOSIS — I10 Essential (primary) hypertension: Secondary | ICD-10-CM | POA: Diagnosis not present

## 2017-12-03 MED ORDER — NITROGLYCERIN 0.4 MG SL SUBL: 0.4000 mg | SUBLINGUAL_TABLET | SUBLINGUAL | 3 refills | Status: DC | PRN

## 2017-12-03 MED ORDER — TICAGRELOR 90 MG PO TABS: 90.0000 mg | ORAL_TABLET | Freq: Two times a day (BID) | ORAL | 3 refills | Status: DC

## 2017-12-03 MED ORDER — METOPROLOL SUCCINATE ER 25 MG PO TB24: 25.0000 mg | ORAL_TABLET | Freq: Every day | ORAL | 3 refills | Status: DC

## 2017-12-03 MED ORDER — TICAGRELOR 90 MG PO TABS: 90.0000 mg | ORAL_TABLET | Freq: Two times a day (BID) | ORAL | 0 refills | Status: DC

## 2017-12-03 NOTE — Patient Instructions (Signed)
Medication Instructions:  I HAVE REFILLED MEDICATIONS- BRILINTA, NITRO   DECREASE METOPROLOL TO 25 MG DAILY   Labwork: NONE  Testing/Procedures: NONE  Follow-Up: Your physician recommends that you schedule a follow-up appointment in: 6 WEEKS    Any Other Special Instructions Will Be Listed Below (If Applicable).     If you need a refill on your cardiac medications before your next appointment, please call your pharmacy.

## 2017-12-03 NOTE — Progress Notes (Signed)
Cardiology Office Note  Date: 12/03/2017   ID: Erin Jordan, DOB 1946/02/24, MRN [id number]  PCP: Sandi Mealy, MD  Primary Cardiologist: Rozann Lesches, MD   Chief Complaint  Patient presents with  . Preoperative cardiac evaluation    History of Present Illness: Erin Jordan is a 72 y.o. female that I have not seen in the office since 2014.  I reviewed interval records, most recent evaluation was with Ms. Vita Barley in April of this year.  She presents to the office for preoperative cardiac consultation at the request of Dr. Harlow Asa with anticipated total thyroidectomy, not yet scheduled.  History includes multivessel CAD status post CABG in 2006.  In March of this year she had a NSTEMI (peak troponin I 2.74) and underwent cardiac catheterization demonstrating native and graft disease as detailed below. She underwent placement of DES to the SVG to diagonal at that point and has been on aspirin and Brilinta subsequently.  She is here with her daughter.  He does not report any angina symptoms or nitroglycerin use.  Describes stable NYHA class II dyspnea, no palpitations or syncope.  Reviewed her medications, she states that she has run out of some of them recently.  Today we discussed her cardiac history and DES graft intervention in March.  Typically, recommendation would be 1 year of uninterrupted dual antiplatelet therapy after ACS with DES intervention prior to pursuing elective surgery off of antiplatelet medication.  Thyroidectomy would not be a type of surgery that could be done on dual antiplatelet therapy due to high bleeding risk.  I do not know based on provide information how urgently her surgery is being contemplated.  Past Medical History:  Diagnosis Date  . Arthritis   . Chronic kidney disease, stage 3, mod decreased GFR (HCC)   . Coronary atherosclerosis of native coronary artery 2006   Multivessel status post CABG in [place], graft disease  documented March 2019 with DES to SVG to diagonal  . Diabetes mellitus, type 2 ([place])   . Essential hypertension, benign   . 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 Hyperlipidemia     Past Surgical History:  Procedure Laterality Date  . ABDOMINAL HYSTERECTOMY    . CORONARY ARTERY BYPASS GRAFT  2006   [place], [place]  . CORONARY STENT INTERVENTION N/A 06/25/2017   Procedure: CORONARY STENT INTERVENTION;  Surgeon: Jettie Booze, MD;  Location: [facility];  Service: Cardiovascular;  Laterality: N/A;  . LEFT HEART CATH AND CORS/GRAFTS ANGIOGRAPHY N/A 06/25/2017   Procedure: LEFT HEART CATH AND CORS/GRAFTS ANGIOGRAPHY;  Surgeon: Jettie Booze, MD;  Location: [facility];  Service: Cardiovascular;  Laterality: N/A;    Current Outpatient Medications  Medication Sig Dispense Refill  . aspirin EC 81 MG tablet Take 1 tablet (81 mg total) by mouth daily. 90 tablet 3  . atorvastatin (LIPITOR) 40 MG tablet Take 1 tablet (40 mg total) by mouth daily. 90 tablet 3  . glipiZIDE (GLUCOTROL) 10 MG tablet Take 1 tablet (10 mg total) by mouth daily before breakfast. 30 tablet 0  . methimazole (TAPAZOLE) 5 MG tablet Take 1 tablet (5 mg total) by mouth daily. 30 tablet 2  . nitroGLYCERIN (NITROSTAT) 0.4 MG SL tablet Place 1 tablet (0.4 mg total) under the tongue every 5 (five) minutes x 3 doses as needed for chest pain. 25 tablet 3  . ticagrelor (BRILINTA) 90 MG TABS tablet Take 1 tablet (90 mg total) by mouth 2 (  two) times daily. 180 tablet 3  . metoprolol succinate (TOPROL XL) 25 MG 24 hr tablet Take 1 tablet (25 mg total) by mouth daily. 90 tablet 3   No current facility-administered medications for this visit.    Allergies:  Patient has no known allergies.   Social History: The patient  reports that she has never smoked. She has never used smokeless tobacco. She reports that she does not drink alcohol or use drugs.   Family History: The patient's family history includes  Heart attack in her mother; Hypertension in her mother.   ROS:  Please see the history of present illness. Otherwise, complete review of systems is positive for intermittently watery mouth.  All other systems are reviewed and negative.   Physical Exam: VS:  BP 122/72 (BP Location: Left Arm)   Pulse 89   Ht 5\' 1"  (1.549 m)   Wt 122 lb (55.3 kg)   SpO2 97%   BMI 23.05 kg/m , BMI Body mass index is 23.05 kg/m.  Wt Readings from Last 3 Encounters:  12/03/17 122 lb (55.3 kg)  11/06/17 128 lb (58.1 kg)  10/14/17 125 lb (56.7 kg)    General: Patient appears comfortable at rest. HEENT: Conjunctiva and lids normal, oropharynx clear. Neck: Supple, no elevated JVP or carotid bruits, goiter present. Lungs: Clear to auscultation, nonlabored breathing at rest. Cardiac: Regular rate and rhythm, no S3, soft systolic murmur, no pericardial rub. Abdomen: Soft, nontender, bowel sounds present, no guarding or rebound. Extremities: No pitting edema, distal pulses 2+. Skin: Warm and dry. Musculoskeletal: No kyphosis. Neuropsychiatric: Alert and oriented x3, affect grossly appropriate.  ECG: I personally reviewed the tracing from 06/26/2017 which showed sinus tachycardia with diffuse nonspecific ST-T changes.  Recent Labwork: 06/24/2017: Magnesium 2.2 06/26/2017: Hemoglobin 13.5; Platelets 207 07/01/2017: BUN 49; Creatinine, Ser 1.83; Potassium 3.9; Sodium 141     Component Value Date/Time   CHOL 200 06/02/2011 0431   TRIG 91 06/02/2011 0431   HDL 45 06/02/2011 0431   CHOLHDL 4.4 06/02/2011 0431   VLDL 18 06/02/2011 0431   LDLCALC 137 (H) 06/02/2011 0431    Other Studies Reviewed Today:  Cardiac catheterization and PCI 06/25/2017:  Ost 1st Diag to 1st Diag lesion is 80% stenosed. THis is a long lesion nad is not grafted.  Prox LAD to Mid LAD lesion is 100% stenosed. LIMA to LAD is patent.  Lat 1st Mrg lesion is 95% stenosed.  Ost 1st Mrg to 1st Mrg lesion is 95% stenosed. SVG to OM is  occluded.  Prox RCA lesion is 100% stenosed. SVG to PDA is occluded. This appears recent.  Ost 2nd Diag to 2nd Diag lesion is 100% stenosed. SVG to Diagonal with ostial 75% lesion.  A drug-eluting stent was successfully placed using a STENT SIERRA 4.00 X 15 MM.  Post intervention, there is a 0% residual stenosis.  The left ventricular ejection fraction is 50-55% by visual estimate.  There is no aortic valve stenosis.   Continue aggressive medical therapy.  Complex circumflex bifurcation lesion.  Increase antianginals.  Would likely lose the large lateral branch of the OM1 if PCI were performed.   Assessment and Plan:  1.  Preoperative cardiac evaluation in a 72 year old woman with multivessel CAD status post CABG in 2006, more recently presentation with NSTEMI in March of this year and documentation of native vessel and graft disease requiring DES intervention to the SVG to diagonal.  She is clinically stable without progressive angina or heart failure symptoms, however  is well under a year out from her DES intervention with ACS.  Typically, dual antiplatelet therapy should be continued for an entire year without interruption in this case.  If surgery needs to be pursued urgently and cannot be delayed, then earlier interruption of dual antiplatelet regimen would need to be pursued, realizing that she would be at a higher risk of stent thrombosis.  I would defer to Dr. Harlow Asa regarding urgency and timing of total thyroidectomy, whether or not this can be reasonably delayed.  If it cannot be, would at least try to get her through a full 6 months of dual antiplatelet therapy which would be through September.  2.  Mixed hyperlipidemia, on Lipitor.  She is following with Dr. Lorra Hals in [place].  3.  Essential hypertension, continues on metoprolol.  Blood pressure is well controlled today.  4.  Type 2 diabetes mellitus, on Glucotrol with follow-up per Dr. Lorra Hals.  Current medicines were reviewed  with the patient today.  Disposition: Follow-up in 6 weeks.  Signed, Satira Sark, MD, [facility] 12/03/2017 1:25 PM    [facility] at [facility] [address], [place], [place] [address] Phone: [phone]; Fax: [phone]

## 2017-12-11 ENCOUNTER — Other Ambulatory Visit: Payer: Medicare HMO

## 2017-12-22 ENCOUNTER — Other Ambulatory Visit: Payer: Self-pay | Admitting: Surgery

## 2017-12-22 ENCOUNTER — Ambulatory Visit
Admission: RE | Admit: 2017-12-22 | Discharge: 2017-12-22 | Disposition: A | Discharge status: Home / Self Care | Payer: Medicare HMO | Source: Ambulatory Visit | Attending: Surgery | Admitting: Surgery

## 2017-12-22 DIAGNOSIS — E042 Nontoxic multinodular goiter: Secondary | ICD-10-CM

## 2017-12-22 DIAGNOSIS — E0590 Thyrotoxicosis, unspecified without thyrotoxic crisis or storm: Secondary | ICD-10-CM

## 2017-12-22 DIAGNOSIS — E049 Nontoxic goiter, unspecified: Secondary | ICD-10-CM

## 2017-12-22 IMAGING — CT CT NECK W/O CM
5 of 6 series · 14 of 33 positions shown, 16 images · non-contrast
Comparison: Cervical CT [DATE]

CLINICAL DATA: Massive goiter.

Creatinine was obtained on site at [HOSPITAL] at [REDACTED].
Results: Creatinine 2.1 mg/dL. GFR calculated at 26. Contrast not
administered.
EXAM:
CT NECK WITHOUT CONTRAST
TECHNIQUE: Multidetector CT imaging of the neck was performed following the
standard protocol without intravenous contrast.

[Series 3: neck 2.00 br40 s3 ax st · axial · 0.35mm/px · z∈[-938,-861]mm · 2 of 117 slices shown, 3 images]
[im 39/117  soft-tissue]
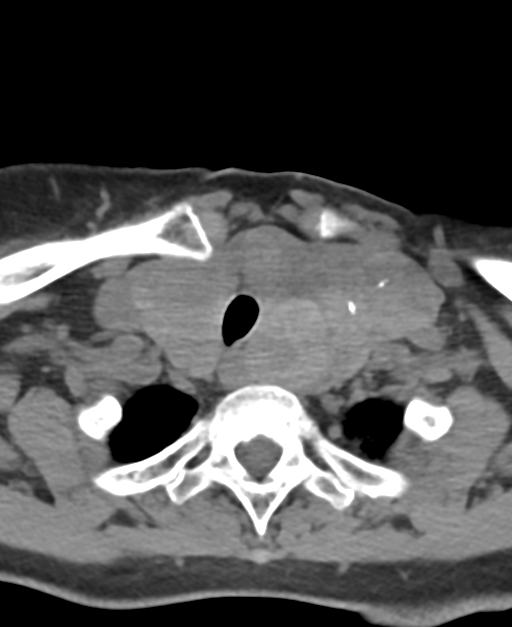
[im 39/117  bone]
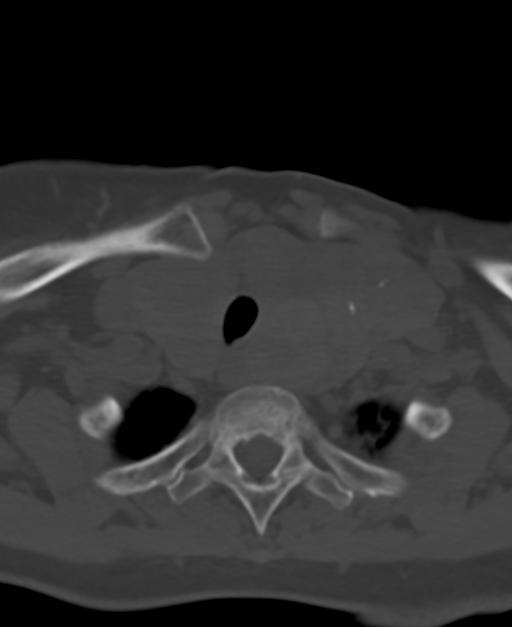
[im 78/117  bone]
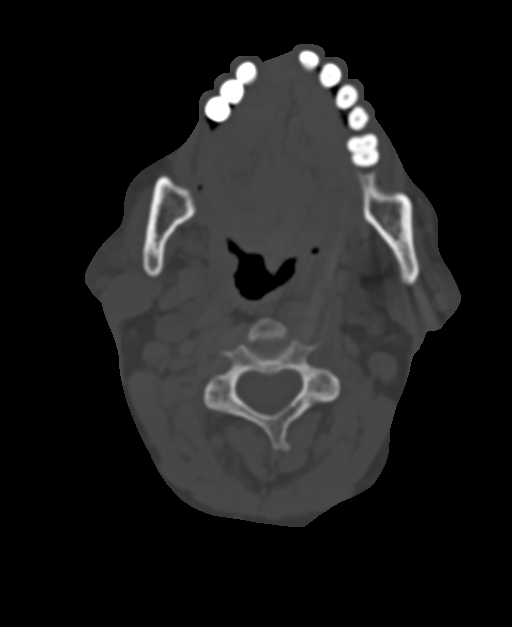

[Series 5: neck 2.00 br36 s3 ax hyoid · axial · 0.35mm/px · z∈[-952,-877]mm · 2 of 117 slices shown]
[im 39/117  bone]
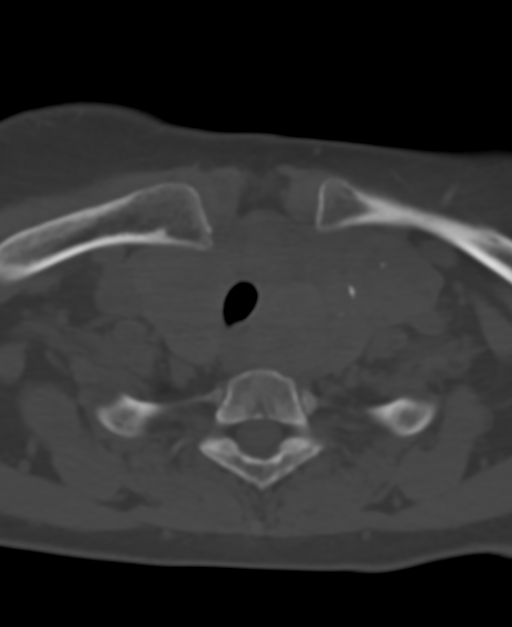
[im 78/117  bone]
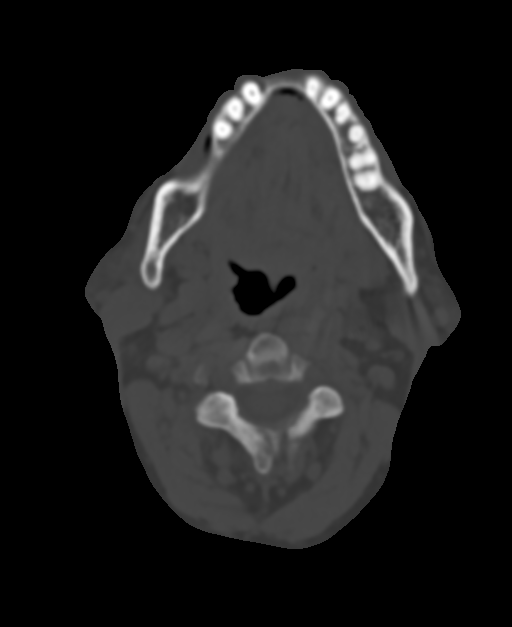

[Series 7: neck 2.00 br60 s3 ax bone · axial · 0.35mm/px · z∈[-946,-869]mm · 2 of 117 slices shown]
[im 39/117  bone]
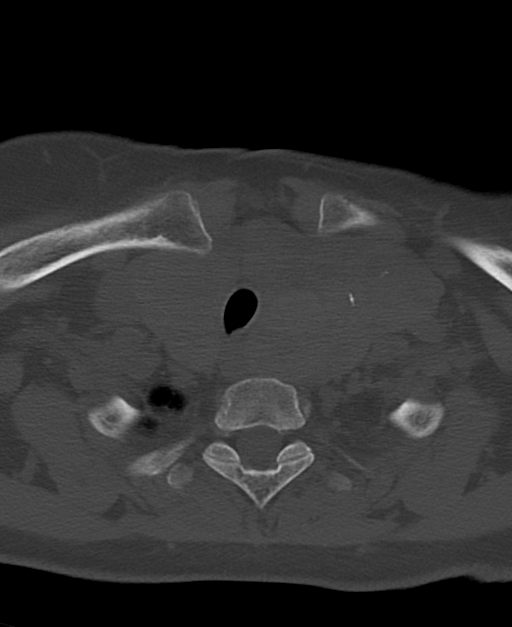
[im 78/117  bone]
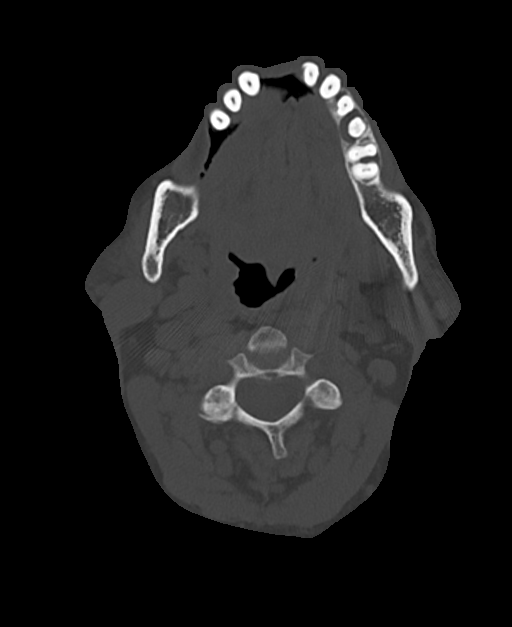

[Series 11: neck 2.00 br40 s3 (person_name) · coronal · 0.35mm/px · 3 of 109 slices shown]
[im 22/109  bone]
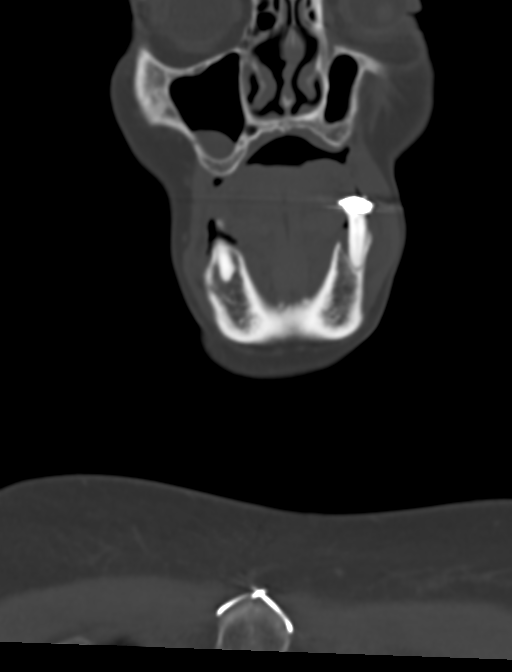
[im 44/109  bone]
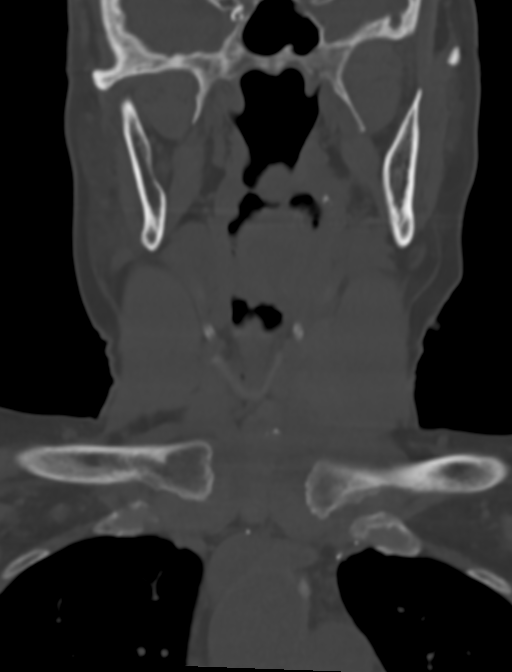
[im 65/109  bone]
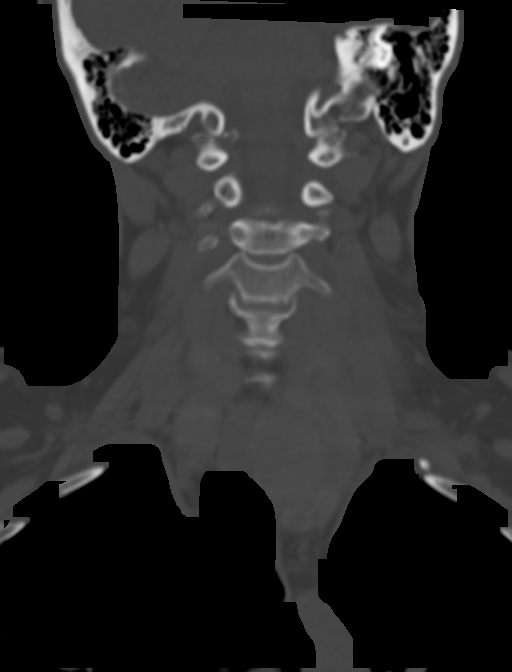

[Series 13: neck 2.00 br40 s3 sag st · sagittal · 0.43mm/px · 5 of 89 slices shown, 6 images]
[im 30/89  bone]
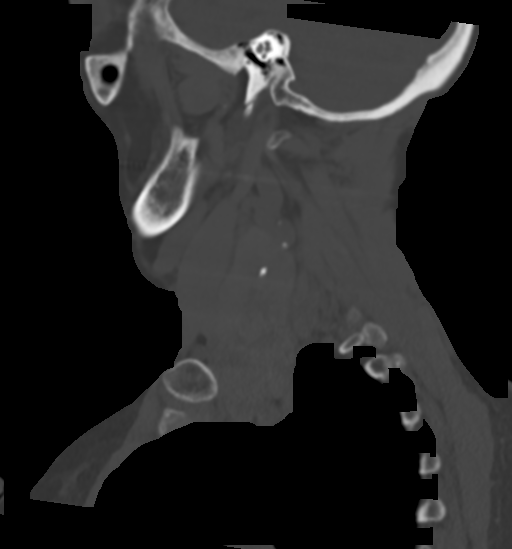
[im 37/89  bone]
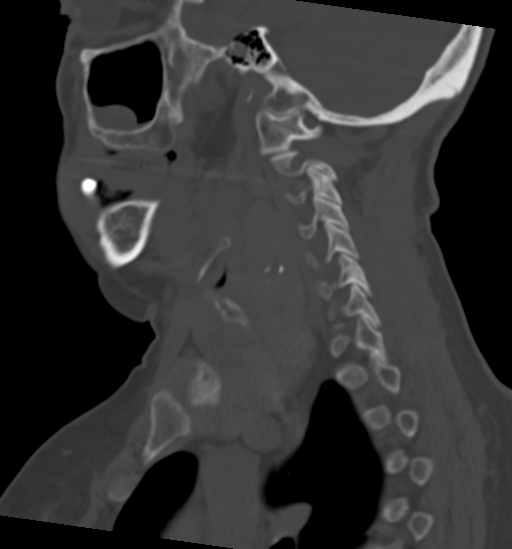
[im 45/89  soft-tissue]
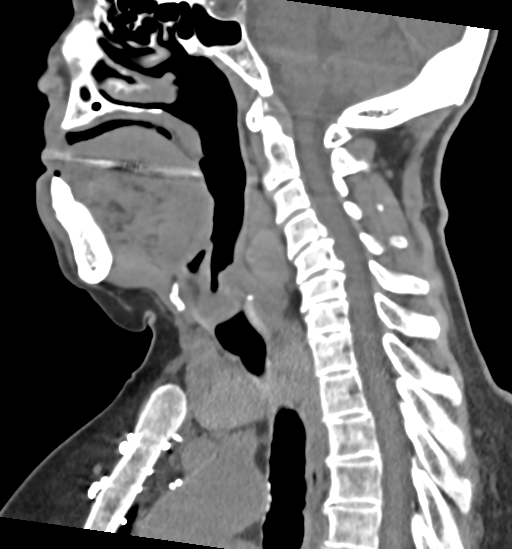
[im 45/89  bone]
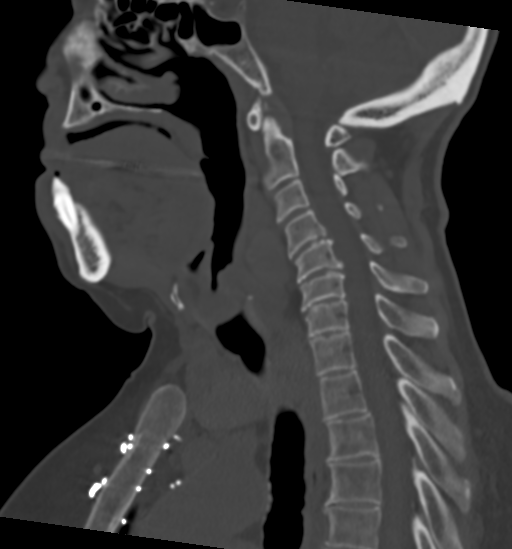
[im 52/89  bone]
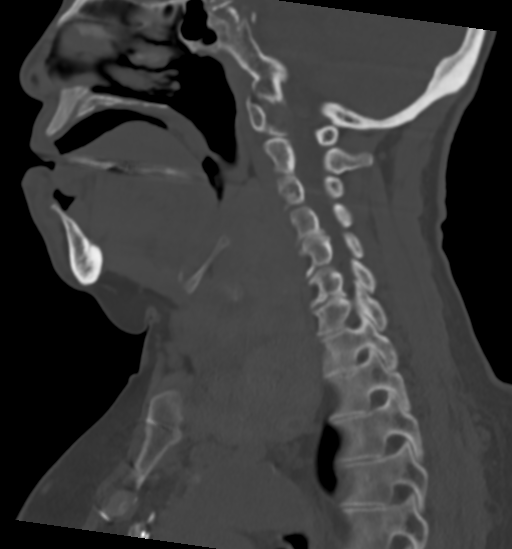
[im 59/89  bone]
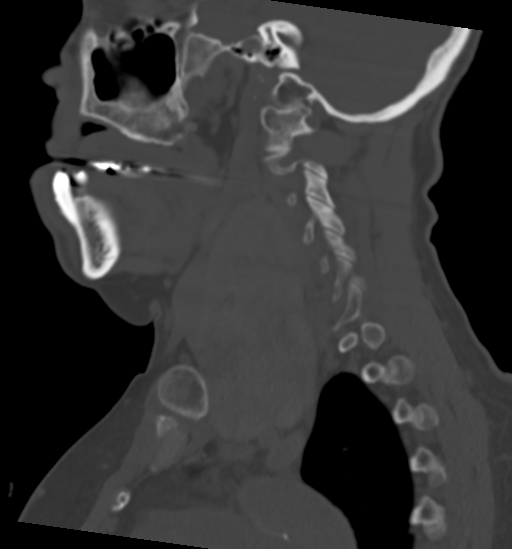

[14 of 33 positions shown; findings below may reference images not displayed]

FINDINGS: Pharynx and larynx: No primary mucosal or submucosal lesion is seen.
Anterior displacement of the hypopharynx and laryngeal region due to
massive goiter.

Salivary glands: Parotid and submandibular glands are normal.

Thyroid: Massive bilateral thyroid goiter. Right lobe measures
cm in length with extension into the superior mediastinum.
Representative transverse diameter is 4.4 x 2.9 cm. Left lobe shows
a length of 11 cm with slightly more extension into the superior
mediastinum. Representative transverse diameter is 5.8 x 3.8 cm.
Thickness of the isthmus which is at the thoracic inlet measures
cm. Thyroid tissue encircles the hypopharynx and larynx and has some
constrictive affect upon the cervical esophagus.

Lymph nodes: No enlarged or low-density nodes on either side of the
neck.

Vascular: Carotid atherosclerotic calcification. Carotid vessels and
jugular veins are displaced laterally.

Limited intracranial: Normal

Visualized orbits: Normal

Mastoids and visualized paranasal sinuses: Clear

Skeleton: Ordinary cervical spondylosis.

Upper chest: No significant pulmonary finding.

Other: None
IMPRESSION: Massive thyroid goiter. Scattered calcifications. Minor cystic
changes. Intrathoracic extension left more than right. Displacement
of the hypopharynx and larynx. Extrinsic mass effect upon the
esophagus. Lateral displacement of the vascular structures.

## 2017-12-22 MED ORDER — IOPAMIDOL (ISOVUE-300) INJECTION 61%: 75.0000 mL | Freq: Once | INTRAVENOUS | Status: DC | PRN

## 2018-01-14 NOTE — Progress Notes (Deleted)
Cardiology Office Note  Date: 01/14/2018   ID: Erin Jordan, DOB [birth year], MRN [id number]  PCP: Erin Mealy, MD  Primary Cardiologist: Erin Lesches, MD   No chief complaint on file.   History of Present Illness: Erin Jordan is a 72 y.o. female last seen in August.  At that time she was being considered for total thyroidectomy by Dr. Harlow Jordan, although was not yet at a 73-month window from DES intervention earlier this year.  Past Medical History:  Diagnosis Date  . Arthritis   . Chronic kidney disease, stage 3, mod decreased GFR (HCC)   . Coronary atherosclerosis of native coronary artery 2006   Multivessel status post CABG in [place], graft disease documented March 2019 with DES to SVG to diagonal  . Diabetes mellitus, type 2 ([place])   . Essential hypertension, benign   . 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 Hyperlipidemia     Past Surgical History:  Procedure Laterality Date  . ABDOMINAL HYSTERECTOMY    . CORONARY ARTERY BYPASS GRAFT  2006   [place], [place]  . CORONARY STENT INTERVENTION N/A 06/25/2017   Procedure: CORONARY STENT INTERVENTION;  Surgeon: Erin Booze, MD;  Location: [place];  Service: Cardiovascular;  Laterality: N/A;  . LEFT HEART CATH AND CORS/GRAFTS ANGIOGRAPHY N/A 06/25/2017   Procedure: LEFT HEART CATH AND CORS/GRAFTS ANGIOGRAPHY;  Surgeon: Erin Booze, MD;  Location: [facility];  Service: Cardiovascular;  Laterality: N/A;    Current Outpatient Medications  Medication Sig Dispense Refill  . aspirin EC 81 MG tablet Take 1 tablet (81 mg total) by mouth daily. 90 tablet 3  . atorvastatin (LIPITOR) 40 MG tablet Take 1 tablet (40 mg total) by mouth daily. 90 tablet 3  . glipiZIDE (GLUCOTROL) 10 MG tablet Take 1 tablet (10 mg total) by mouth daily before breakfast. 30 tablet 0  . methimazole (TAPAZOLE) 5 MG tablet Take 1 tablet (5 mg total) by mouth daily. 30 tablet 2  . metoprolol  succinate (TOPROL XL) 25 MG 24 hr tablet Take 1 tablet (25 mg total) by mouth daily. 90 tablet 3  . nitroGLYCERIN (NITROSTAT) 0.4 MG SL tablet Place 1 tablet (0.4 mg total) under the tongue every 5 (five) minutes x 3 doses as needed for chest pain. 25 tablet 3  . ticagrelor (BRILINTA) 90 MG TABS tablet Take 1 tablet (90 mg total) by mouth 2 (two) times daily. 180 tablet 3   No current facility-administered medications for this visit.    Allergies:  Patient has no known allergies.   Social History: The patient  reports that she has never smoked. She has never used smokeless tobacco. She reports that she does not drink alcohol or use drugs.   Family History: The patient's family history includes Heart attack in her mother; Hypertension in her mother.   ROS:  Please see the history of present illness. Otherwise, complete review of systems is positive for {NONE DEFAULTED:18576::"none"}.  All other systems are reviewed and negative.   Physical Exam: VS:  There were no vitals taken for this visit., BMI There is no height or weight on file to calculate BMI.  Wt Readings from Last 3 Encounters:  12/03/17 122 lb (55.3 kg)  11/06/17 128 lb (58.1 kg)  10/14/17 125 lb (56.7 kg)    General: Patient appears comfortable at rest. HEENT: Conjunctiva and lids normal, oropharynx clear with moist mucosa. Neck: Supple, no elevated JVP or carotid bruits, no thyromegaly.  Lungs: Clear to auscultation, nonlabored breathing at rest. Cardiac: Regular rate and rhythm, no S3 or significant systolic murmur, no pericardial rub. Abdomen: Soft, nontender, no hepatomegaly, bowel sounds present, no guarding or rebound. Extremities: No pitting edema, distal pulses 2+. Skin: Warm and dry. Musculoskeletal: No kyphosis. Neuropsychiatric: Alert and oriented x3, affect grossly appropriate.  ECG: I personally reviewed the tracing from 06/26/2017 which showed sinus tachycardia with diffuse nonspecific ST-T changes.  Recent  Labwork: 06/24/2017: Magnesium 2.2 06/26/2017: Hemoglobin 13.5; Platelets 207 07/01/2017: BUN 49; Creatinine, Ser 1.83; Potassium 3.9; Sodium 141   Other Studies Reviewed Today:  Cardiac catheterization and PCI 06/25/2017:  Ost 1st Diag to 1st Diag lesion is 80% stenosed. THis is a long lesion nad is not grafted.  Prox LAD to Mid LAD lesion is 100% stenosed. LIMA to LAD is patent.  Lat 1st Mrg lesion is 95% stenosed.  Ost 1st Mrg to 1st Mrg lesion is 95% stenosed. SVG to OM is occluded.  Prox RCA lesion is 100% stenosed. SVG to PDA is occluded. This appears recent.  Ost 2nd Diag to 2nd Diag lesion is 100% stenosed. SVG to Diagonal with ostial 75% lesion.  A drug-eluting stent was successfully placed using a STENT SIERRA 4.00 X 15 MM.  Post intervention, there is a 0% residual stenosis.  The left ventricular ejection fraction is 50-55% by visual estimate.  There is no aortic valve stenosis.  Continue aggressive medical therapy. Complex circumflex bifurcation lesion. Increase antianginals. Would likely lose the large lateral branch of the OM1 if PCI were performed.   CT soft tissue neck 12/22/2017: IMPRESSION: Massive thyroid goiter. Scattered calcifications. Minor cystic changes. Intrathoracic extension left more than right. Displacement of the hypopharynx and larynx. Extrinsic mass effect upon the esophagus. Lateral displacement of the vascular structures.  Assessment and Plan:   Current medicines were reviewed with the patient today.  No orders of the defined types were placed in this encounter.   Disposition:  Signed, Erin Sark, MD, [facility] 01/14/2018 10:18 AM    [place] [facility] [place] [place], [place] [address] Phone: [phone]; Fax: [phone]

## 2018-01-15 ENCOUNTER — Ambulatory Visit: Payer: Medicare HMO | Admitting: Cardiology

## 2018-01-28 NOTE — Progress Notes (Signed)
Cardiology Office Note  Date: 01/29/2018   ID: SHARLYNN SECKINGER, DOB [birth year], MRN [id number]  PCP: Sandi Mealy, MD  Primary Cardiologist: Rozann Lesches, MD   Chief Complaint  Patient presents with  . Coronary Artery Disease    History of Present Illness: MELIDA NORTHINGTON is a 72 y.o. female last seen in August.  At that time she was being considered for total thyroidectomy by Dr. Harlow Asa, although was not yet at a 16-month window from DES intervention earlier this year.  I received recent communication from Dr. Harlow Asa indicating plan to defer surgery until she has completed 1 year of dual antiplatelet therapy.  She presents today for a follow-up visit.  States that she has had no angina symptoms and has not used any nitroglycerin.  We went over her medications and discussed continued compliance with aspirin and Brilinta.  She does not report any bleeding problems.  Past Medical History:  Diagnosis Date  . Arthritis   . Chronic kidney disease, stage 3, mod decreased GFR (HCC)   . Coronary atherosclerosis of native coronary artery 2006   Multivessel status post CABG in [place], graft disease documented March 2019 with DES to SVG to diagonal  . Diabetes mellitus, type 2 ([place])   . Essential hypertension, benign   . 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 Hyperlipidemia     Past Surgical History:  Procedure Laterality Date  . ABDOMINAL HYSTERECTOMY    . CORONARY ARTERY BYPASS GRAFT  2006   [place], [place]  . CORONARY STENT INTERVENTION N/A 06/25/2017   Procedure: CORONARY STENT INTERVENTION;  Surgeon: Jettie Booze, MD;  Location: [facility];  Service: Cardiovascular;  Laterality: N/A;  . LEFT HEART CATH AND CORS/GRAFTS ANGIOGRAPHY N/A 06/25/2017   Procedure: LEFT HEART CATH AND CORS/GRAFTS ANGIOGRAPHY;  Surgeon: Jettie Booze, MD;  Location: [facility];  Service: Cardiovascular;  Laterality: N/A;    Current Outpatient  Medications  Medication Sig Dispense Refill  . aspirin EC 81 MG tablet Take 1 tablet (81 mg total) by mouth daily. 90 tablet 3  . atorvastatin (LIPITOR) 40 MG tablet Take 1 tablet (40 mg total) by mouth daily. 90 tablet 3  . glipiZIDE (GLUCOTROL) 10 MG tablet Take 1 tablet (10 mg total) by mouth daily before breakfast. 30 tablet 0  . methimazole (TAPAZOLE) 5 MG tablet Take 1 tablet (5 mg total) by mouth daily. 30 tablet 2  . metoprolol succinate (TOPROL XL) 25 MG 24 hr tablet Take 1 tablet (25 mg total) by mouth daily. 90 tablet 3  . nitroGLYCERIN (NITROSTAT) 0.4 MG SL tablet Place 1 tablet (0.4 mg total) under the tongue every 5 (five) minutes x 3 doses as needed for chest pain. 25 tablet 3  . ticagrelor (BRILINTA) 90 MG TABS tablet Take 1 tablet (90 mg total) by mouth 2 (two) times daily. 180 tablet 3   No current facility-administered medications for this visit.    Allergies:  Patient has no known allergies.   Social History: The patient  reports that she has never smoked. She has never used smokeless tobacco. She reports that she does not drink alcohol or use drugs.   ROS:  Please see the history of present illness. Otherwise, complete review of systems is positive for none.  All other systems are reviewed and negative.   Physical Exam: VS:  BP 126/76 (BP Location: Right Arm)   Pulse 77   Ht 5' (1.524 m)  Wt 124 lb (56.2 kg)   SpO2 94%   BMI 24.22 kg/m , BMI Body mass index is 24.22 kg/m.  Wt Readings from Last 3 Encounters:  01/29/18 124 lb (56.2 kg)  12/03/17 122 lb (55.3 kg)  11/06/17 128 lb (58.1 kg)    General: Patient appears comfortable at rest. HEENT: Conjunctiva and lids normal, oropharynx clear. Neck: Supple, no elevated JVP or carotid bruits, no thyromegaly. Lungs: Clear to auscultation, nonlabored breathing at rest. Cardiac: Regular rate and rhythm, no S3, soft systolic murmur. Abdomen: Soft, nontender, bowel sounds present. Extremities: No pitting edema,  distal pulses 2+. Skin: Warm and dry. Musculoskeletal: No kyphosis. Neuropsychiatric: Alert and oriented x3, affect grossly appropriate.  ECG: I personally reviewed the tracing from 06/26/2017 which showed sinus tachycardia with diffuse nonspecific ST-T changes.  Recent Labwork: 06/24/2017: Magnesium 2.2 06/26/2017: Hemoglobin 13.5; Platelets 207 07/01/2017: BUN 49; Creatinine, Ser 1.83; Potassium 3.9; Sodium 141     Component Value Date/Time   CHOL 200 06/02/2011 0431   TRIG 91 06/02/2011 0431   HDL 45 06/02/2011 0431   CHOLHDL 4.4 06/02/2011 0431   VLDL 18 06/02/2011 0431   LDLCALC 137 (H) 06/02/2011 0431    Other Studies Reviewed Today:  Cardiac catheterization and PCI 06/25/2017:  Ost 1st Diag to 1st Diag lesion is 80% stenosed. THis is a long lesion nad is not grafted.  Prox LAD to Mid LAD lesion is 100% stenosed. LIMA to LAD is patent.  Lat 1st Mrg lesion is 95% stenosed.  Ost 1st Mrg to 1st Mrg lesion is 95% stenosed. SVG to OM is occluded.  Prox RCA lesion is 100% stenosed. SVG to PDA is occluded. This appears recent.  Ost 2nd Diag to 2nd Diag lesion is 100% stenosed. SVG to Diagonal with ostial 75% lesion.  A drug-eluting stent was successfully placed using a STENT SIERRA 4.00 X 15 MM.  Post intervention, there is a 0% residual stenosis.  The left ventricular ejection fraction is 50-55% by visual estimate.  There is no aortic valve stenosis.  Continue aggressive medical therapy. Complex circumflex bifurcation lesion. Increase antianginals. Would likely lose the large lateral branch of the OM1 if PCI were performed.   CT soft tissue neck 12/22/2017: IMPRESSION: Massive thyroid goiter. Scattered calcifications. Minor cystic changes. Intrathoracic extension left more than right. Displacement of the hypopharynx and larynx. Extrinsic mass effect upon the esophagus. Lateral displacement of the vascular structures.  Assessment and Plan:  1.  CAD with history of  CABG in 2006 with subsequent DES intervention to the SVG to diagonal in March of this year in the setting of NSTEMI.  She is clinically stable at this time with plan to continue dual antiplatelet therapy for at least one year.  2.  Large thyroid goiter, plans for total thyroidectomy are on hold at this time per Dr. Harlow Asa.  Suspect she would be able to proceed around March 2020 when she is able to stop aspirin and Brilinta.  3.  Mixed hyperlipidemia, continues on Lipitor.  Keep follow-up with Dr. Lorra Hals.  4.  Essential hypertension, blood pressure is adequately controlled today.  No changes made to current regimen.  Current medicines were reviewed with the patient today.  Disposition: Follow-up in 4 months.  Signed, Satira Sark, MD, [facility] 01/29/2018 11:22 AM    [facility] [address] [place], [place] [address] Phone: [phone]; Fax: [phone]

## 2018-01-29 ENCOUNTER — Encounter: Payer: Self-pay | Admitting: Cardiology

## 2018-01-29 ENCOUNTER — Ambulatory Visit (INDEPENDENT_AMBULATORY_CARE_PROVIDER_SITE_OTHER): Payer: Medicare HMO | Admitting: Cardiology

## 2018-01-29 VITALS — BP 126/76 | HR 77 | Ht 60.0 in | Wt 124.0 lb

## 2018-01-29 DIAGNOSIS — I10 Essential (primary) hypertension: Secondary | ICD-10-CM | POA: Diagnosis not present

## 2018-01-29 DIAGNOSIS — E782 Mixed hyperlipidemia: Secondary | ICD-10-CM

## 2018-01-29 DIAGNOSIS — E049 Nontoxic goiter, unspecified: Secondary | ICD-10-CM | POA: Diagnosis not present

## 2018-01-29 DIAGNOSIS — I25119 Atherosclerotic heart disease of native coronary artery with unspecified angina pectoris: Secondary | ICD-10-CM | POA: Diagnosis not present

## 2018-01-29 NOTE — Patient Instructions (Signed)
Medication Instructions:  Your physician recommends that you continue on your current medications as directed. Please refer to the Current Medication list given to you today.  If you need a refill on your cardiac medications before your next appointment, please call your pharmacy.   Lab work: NONE If you have labs (blood work) drawn today and your tests are completely normal, you will receive your results only by: Marland Kitchen MyChart Message (if you have MyChart) OR . A paper copy in the mail If you have any lab test that is abnormal or we need to change your treatment, we will call you to review the results.  Testing/Procedures: NONE  Follow-Up: 4 months with Dr.McDowell  Any Other Special Instructions Will Be Listed Below (If Applicable). NONE

## 2018-04-20 ENCOUNTER — Telehealth: Payer: Self-pay | Admitting: Cardiology

## 2018-04-20 MED ORDER — ATORVASTATIN CALCIUM 40 MG PO TABS: 40.0000 mg | ORAL_TABLET | Freq: Every day | ORAL | 3 refills | Status: DC

## 2018-04-20 MED ORDER — METOPROLOL SUCCINATE ER 25 MG PO TB24: 25.0000 mg | ORAL_TABLET | Freq: Every day | ORAL | 3 refills | Status: DC

## 2018-04-20 MED ORDER — TICAGRELOR 90 MG PO TABS: 90.0000 mg | ORAL_TABLET | Freq: Two times a day (BID) | ORAL | 3 refills | Status: DC

## 2018-04-20 NOTE — Telephone Encounter (Signed)
Patient's granddaughter has questions regarding patient's medications. / tg

## 2018-04-20 NOTE — Telephone Encounter (Signed)
Error

## 2018-04-20 NOTE — Telephone Encounter (Signed)
PT called and gave me permission to speak with her granddaughter Roderic Scarce. She sates that she needs new RX's for medication sent to [facility]. She also questions Dr. Liliane Channel appointments. She stated she cannot ever get a call back, nor an appointment when needed. I will send a letter asking them to call pt.

## 2018-04-20 NOTE — Telephone Encounter (Signed)
Returned pt call, no answer. Left message for pt to return call.

## 2018-04-24 ENCOUNTER — Other Ambulatory Visit: Payer: Self-pay

## 2018-04-24 ENCOUNTER — Emergency Department (HOSPITAL_COMMUNITY)
Admission: EM | Admit: 2018-04-24 | Discharge: 2018-04-25 | Disposition: A | Discharge status: Home / Self Care | Payer: Medicare HMO | Attending: Emergency Medicine | Admitting: Emergency Medicine

## 2018-04-24 ENCOUNTER — Emergency Department (HOSPITAL_COMMUNITY): Payer: Medicare HMO

## 2018-04-24 ENCOUNTER — Encounter (HOSPITAL_COMMUNITY): Payer: Self-pay

## 2018-04-24 DIAGNOSIS — J189 Pneumonia, unspecified organism: Secondary | ICD-10-CM

## 2018-04-24 DIAGNOSIS — Z7982 Long term (current) use of aspirin: Secondary | ICD-10-CM | POA: Diagnosis not present

## 2018-04-24 DIAGNOSIS — R748 Abnormal levels of other serum enzymes: Secondary | ICD-10-CM

## 2018-04-24 DIAGNOSIS — R1084 Generalized abdominal pain: Secondary | ICD-10-CM | POA: Diagnosis not present

## 2018-04-24 DIAGNOSIS — R062 Wheezing: Secondary | ICD-10-CM | POA: Diagnosis not present

## 2018-04-24 DIAGNOSIS — N183 Chronic kidney disease, stage 3 (moderate): Secondary | ICD-10-CM | POA: Insufficient documentation

## 2018-04-24 DIAGNOSIS — Z7984 Long term (current) use of oral hypoglycemic drugs: Secondary | ICD-10-CM | POA: Insufficient documentation

## 2018-04-24 DIAGNOSIS — I129 Hypertensive chronic kidney disease with stage 1 through stage 4 chronic kidney disease, or unspecified chronic kidney disease: Secondary | ICD-10-CM | POA: Insufficient documentation

## 2018-04-24 DIAGNOSIS — R05 Cough: Secondary | ICD-10-CM | POA: Insufficient documentation

## 2018-04-24 DIAGNOSIS — N189 Chronic kidney disease, unspecified: Secondary | ICD-10-CM

## 2018-04-24 DIAGNOSIS — J181 Lobar pneumonia, unspecified organism: Secondary | ICD-10-CM

## 2018-04-24 DIAGNOSIS — E119 Type 2 diabetes mellitus without complications: Secondary | ICD-10-CM | POA: Diagnosis not present

## 2018-04-24 LAB — COMPREHENSIVE METABOLIC PANEL
ALK PHOS: 97 U/L (ref 38–126)
ALT: 72 U/L — ABNORMAL HIGH (ref 0–44)
ANION GAP: 11 (ref 5–15)
AST: 57 U/L — AB (ref 15–41)
Albumin: 3.4 g/dL — ABNORMAL LOW (ref 3.5–5.0)
BUN: 50 mg/dL — ABNORMAL HIGH (ref 8–23)
CHLORIDE: 105 mmol/L (ref 98–111)
CO2: 21 mmol/L — ABNORMAL LOW (ref 22–32)
Calcium: 8.9 mg/dL (ref 8.9–10.3)
Creatinine, Ser: 2.29 mg/dL — ABNORMAL HIGH (ref 0.44–1.00)
GFR calc Af Amer: 24 mL/min — ABNORMAL LOW (ref >60)
GFR calc non Af Amer: 20 mL/min — ABNORMAL LOW (ref >60)
GLUCOSE: 145 mg/dL — AB (ref 70–99)
POTASSIUM: 3.2 mmol/L — AB (ref 3.5–5.1)
SODIUM: 137 mmol/L (ref 135–145)
Total Bilirubin: 0.5 mg/dL (ref 0.3–1.2)
Total Protein: 7.2 g/dL (ref 6.5–8.1)

## 2018-04-24 LAB — CBC WITH DIFFERENTIAL/PLATELET
ABS IMMATURE GRANULOCYTES: 0.08 10*3/uL — AB (ref 0.00–0.07)
Basophils Absolute: 0 10*3/uL (ref 0.0–0.1)
Basophils Relative: 0 %
Eosinophils Absolute: 0.2 10*3/uL (ref 0.0–0.5)
Eosinophils Relative: 2 %
HCT: 37.2 % (ref 36.0–46.0)
HEMOGLOBIN: 12.2 g/dL (ref 12.0–15.0)
IMMATURE GRANULOCYTES: 1 %
LYMPHS PCT: 8 %
Lymphs Abs: 0.7 10*3/uL (ref 0.7–4.0)
MCH: 30 pg (ref 26.0–34.0)
MCHC: 32.8 g/dL (ref 30.0–36.0)
MCV: 91.4 fL (ref 80.0–100.0)
MONO ABS: 0.9 10*3/uL (ref 0.1–1.0)
MONOS PCT: 9 %
NEUTROS ABS: 8 10*3/uL — AB (ref 1.7–7.7)
Neutrophils Relative %: 80 %
Platelets: 281 10*3/uL (ref 150–400)
RBC: 4.07 MIL/uL (ref 3.87–5.11)
RDW: 11.9 % (ref 11.5–15.5)
WBC: 9.9 10*3/uL (ref 4.0–10.5)
nRBC: 0 % (ref 0.0–0.2)

## 2018-04-24 LAB — TROPONIN I: Troponin I: <0.03 ng/mL (ref <0.03)

## 2018-04-24 LAB — LIPASE, BLOOD: Lipase: 264 U/L — ABNORMAL HIGH (ref 11–51)

## 2018-04-24 IMAGING — CT CT ABD-PELV W/O
2 of 4 series · 15 of 46 positions shown, 17 images · non-contrast
Comparison: Renal ultrasound dated [DATE] and the abdominal
ultrasound dated [DATE]

CLINICAL DATA: 72-year-old female with abdominal pain and elevated
lipase.

EXAM:
CT ABDOMEN AND PELVIS WITHOUT CONTRAST
TECHNIQUE: Multidetector CT imaging of the abdomen and pelvis was performed
following the standard protocol without IV contrast.

[Series 2: axial st · axial · 0.67mm/px · z∈[+1060,+1420]mm · 12 of 80 slices shown, 14 images]
[im 4/80  soft-tissue]
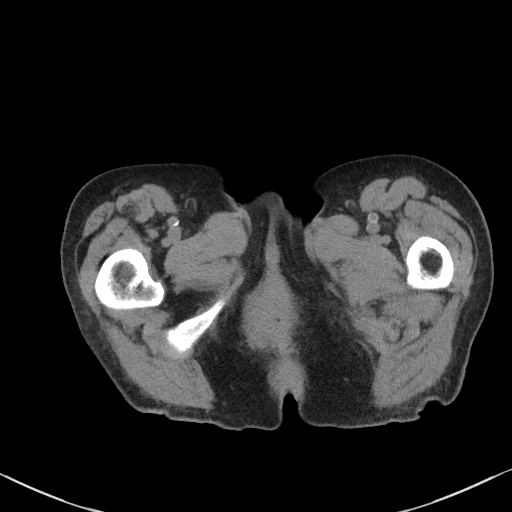
[im 4/80  bone]
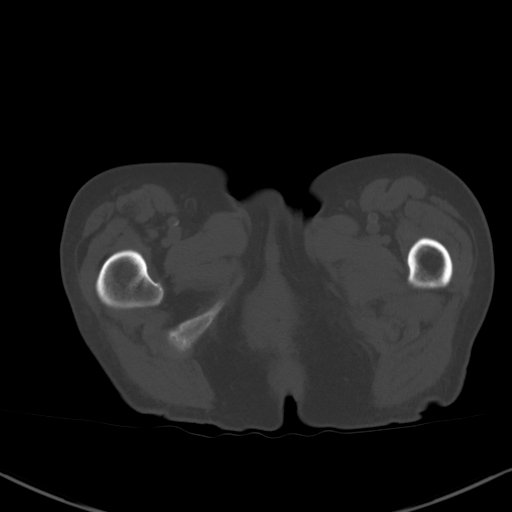
[im 10/80  soft-tissue]
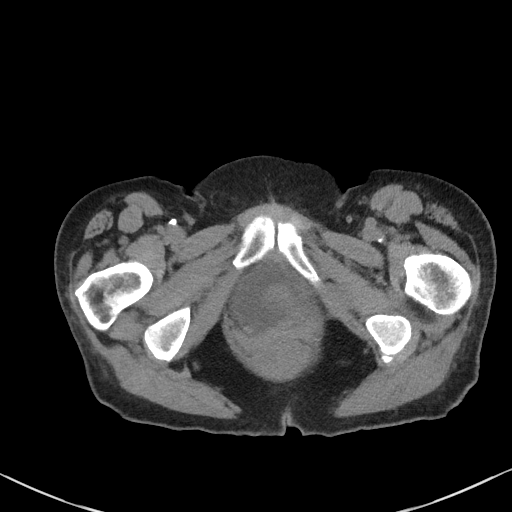
[im 17/80  soft-tissue]
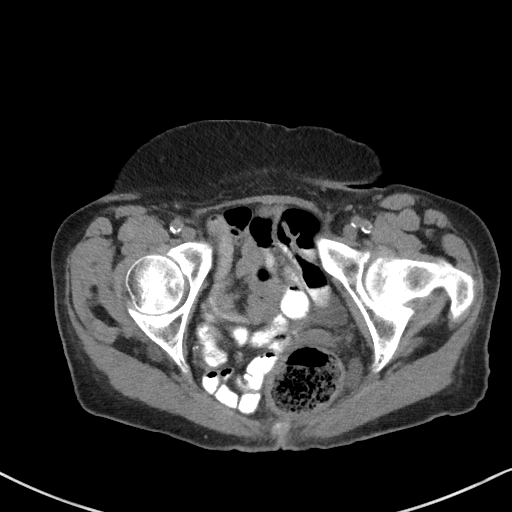
[im 24/80  soft-tissue]
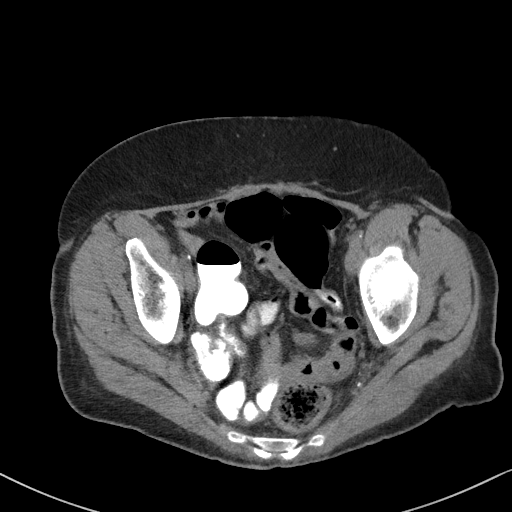
[im 30/80  soft-tissue]
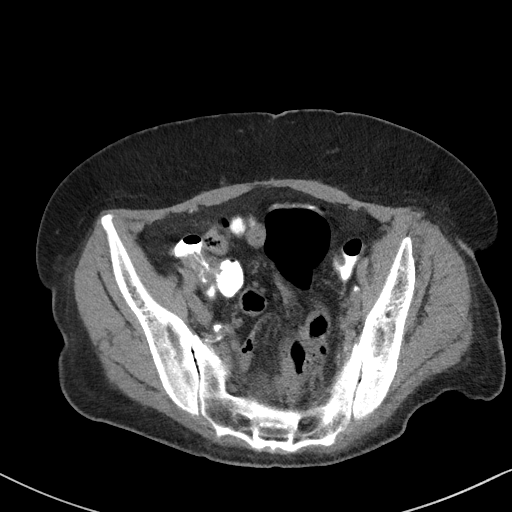
[im 37/80  soft-tissue]
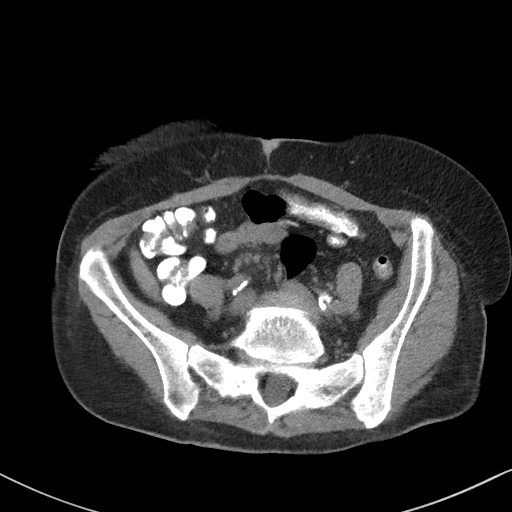
[im 43/80  soft-tissue]
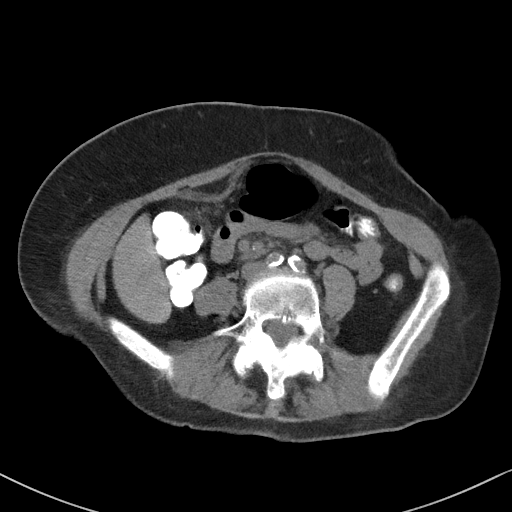
[im 50/80  soft-tissue]
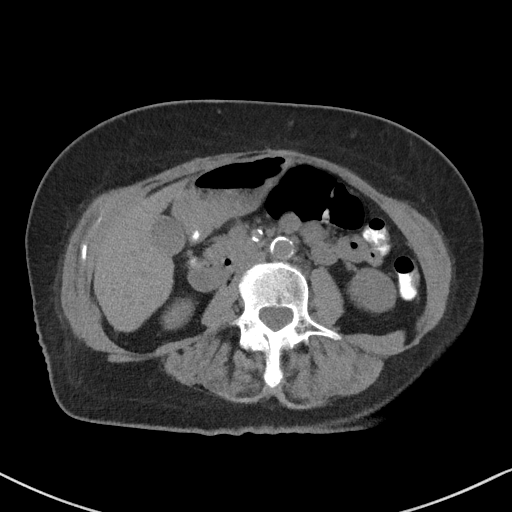
[im 56/80  soft-tissue]
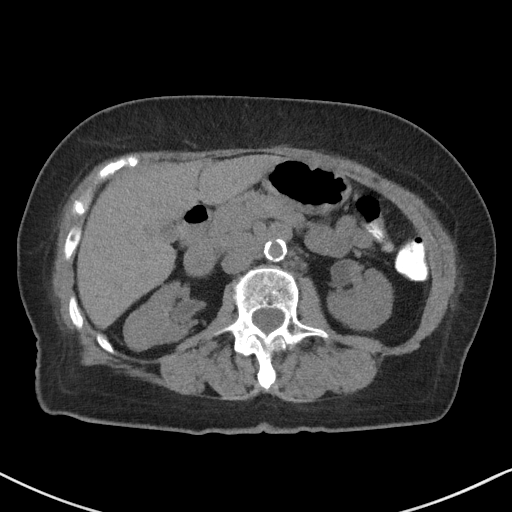
[im 56/80  bone]
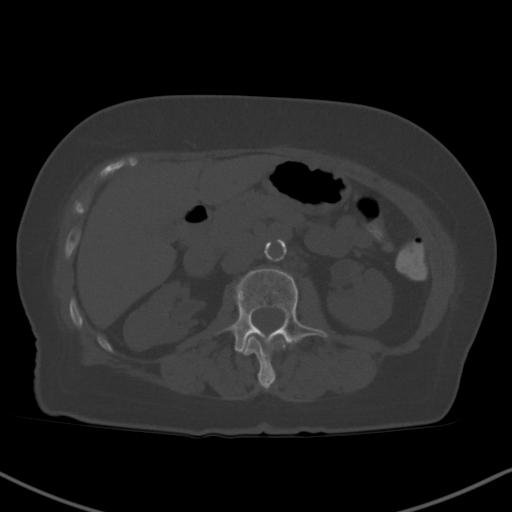
[im 63/80  soft-tissue]
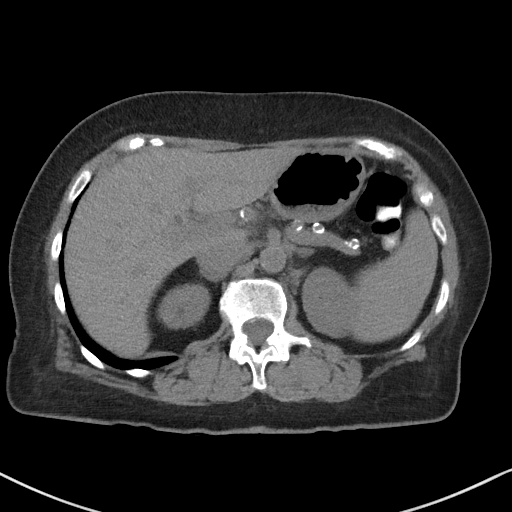
[im 70/80  soft-tissue]
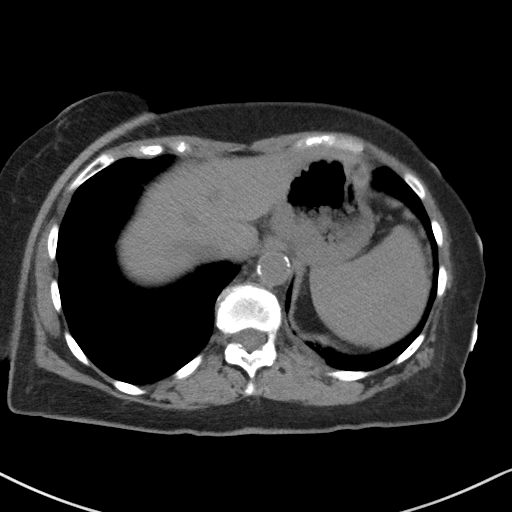
[im 76/80  soft-tissue]
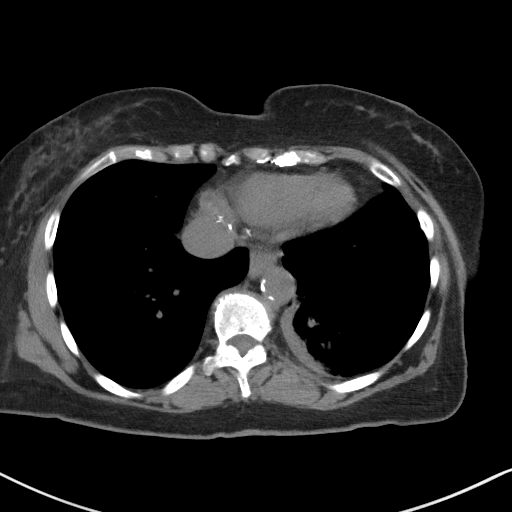

[Series 5: coronal st · coronal · 0.73mm/px · 3 of 98 slices shown]
[im 33/98  soft-tissue]
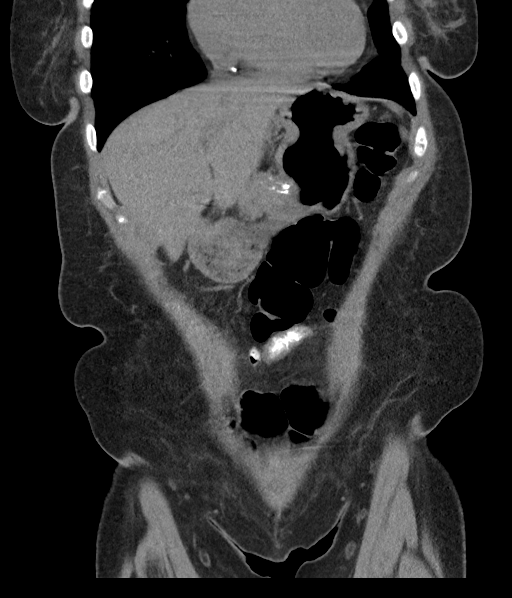
[im 44/98  soft-tissue]
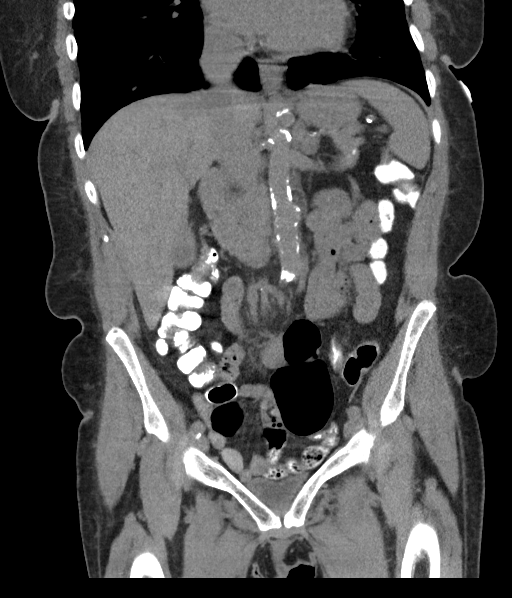
[im 54/98  soft-tissue]
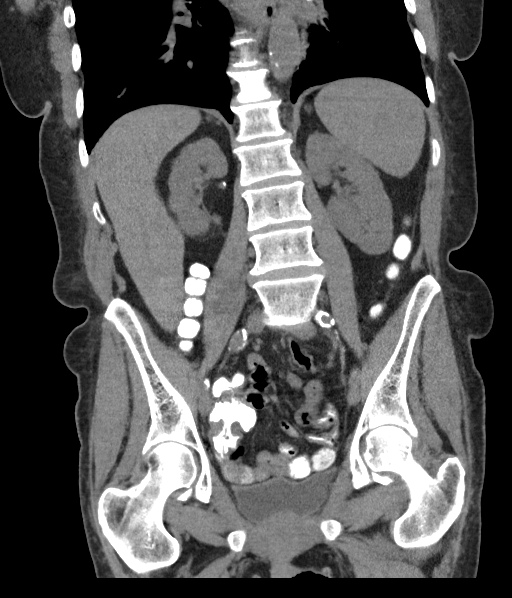

[15 of 46 positions shown; findings below may reference images not displayed]

FINDINGS: Evaluation of this exam is limited in the absence of intravenous
contrast.

Lower chest: Partially visualized patchy airspace opacity at the
left lung base most consistent with pneumonia. There is apparent
plugging of a left lower lobe bronchus. The visualized right lung
base is unremarkable. There is coronary vascular calcification.

No intra-abdominal free air or free fluid.

Hepatobiliary: There is a small focus of calcified granuloma in the
right lower lobe. The liver is otherwise unremarkable. No
intrahepatic biliary ductal dilatation.. No calcified gallstone.

Pancreas: Unremarkable. No pancreatic ductal dilatation or
surrounding inflammatory changes.

Spleen: Normal in size without focal abnormality.

Adrenals/Urinary Tract: Probable left adrenal
thickening/hyperplasia. The right adrenal gland is unremarkable.
There is mild right renal parenchyma atrophy. There is an
ill-defined partially exophytic solid-appearing mass from the
lateral interpolar aspect of the right kidney measuring 2.5 x 3.0 x
2.0 cm. This mass is not characterized on this noncontrast CT.
Further characterization with MRI without and with contrast is
recommended. An additional 1 cm hypodense lesion from the superior
pole of the right kidney is not characterized but may represent a
cyst. There is no hydronephrosis or nephrolithiasis on either side.
There is mild fullness of the renal pelvis bilaterally. The ureters
and urinary bladder are grossly unremarkable as visualized.

Stomach/Bowel: Oral contrast noted throughout the colon. There is no
bowel obstruction or active inflammation. The appendix is normal as
visualized.

Vascular/Lymphatic: There is advanced aortoiliac atherosclerotic
disease. No portal venous gas. There is no adenopathy.

Reproductive: Hysterectomy. No pelvic mass.

Other: None

Musculoskeletal: Mild degenerative changes of the spine. Grade 1
L4-L5 anterolisthesis. Multilevel facet arthropathy. No acute
osseous pathology.
IMPRESSION: 1. Left lower lobe pneumonia.
2. No bowel obstruction or active inflammation. Normal appendix.
3. Ill-defined solid-appearing mass from the lateral interpolar
aspect of the right kidney. Further characterization with MRI
without and with contrast is recommended.

## 2018-04-24 IMAGING — DX DG CHEST 2V
2 series · 2 of 2 positions shown · non-contrast
Comparison: [DATE]

CLINICAL DATA: Chest pain and shortness of breath since last week

EXAM:
CHEST - 2 VIEW

[chest lat]
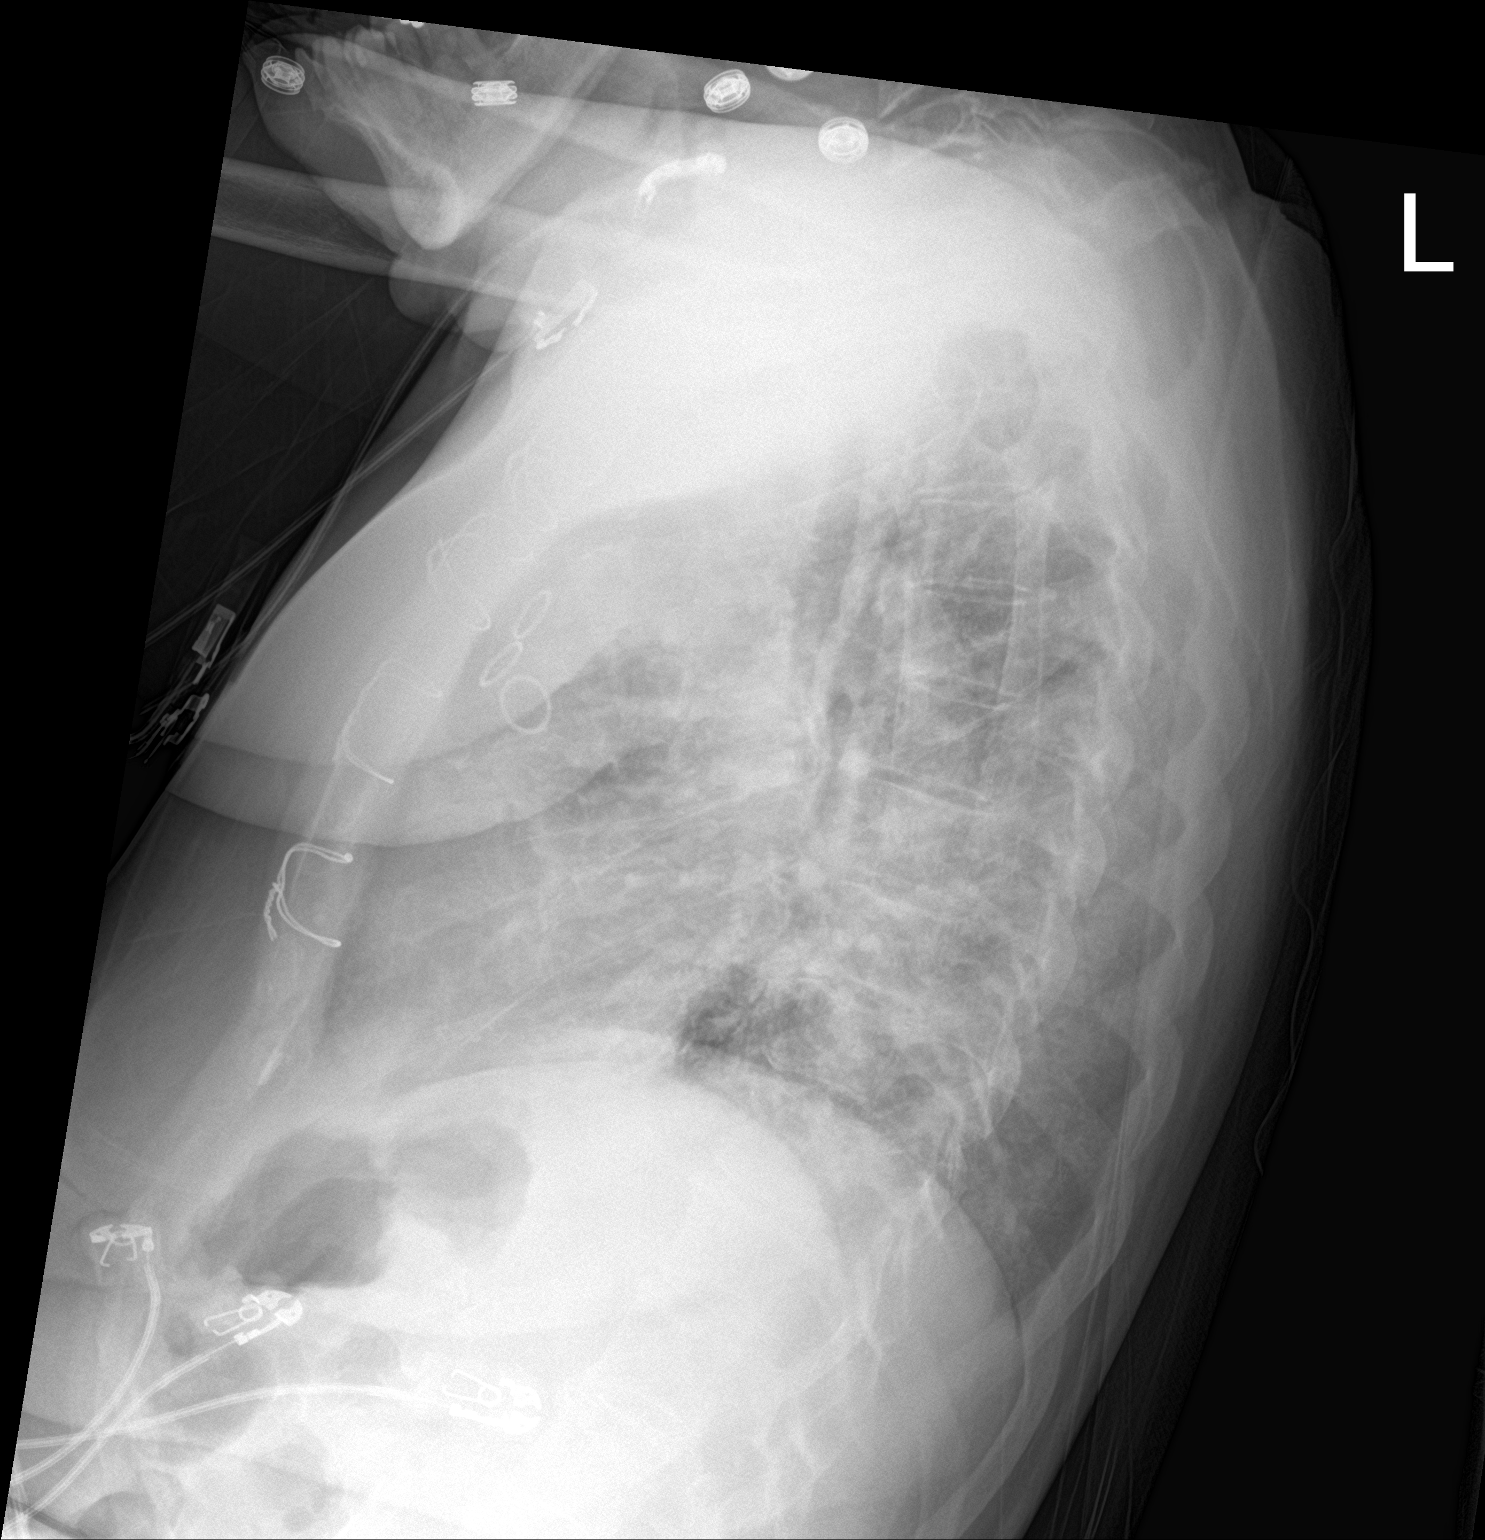

[chest ap]
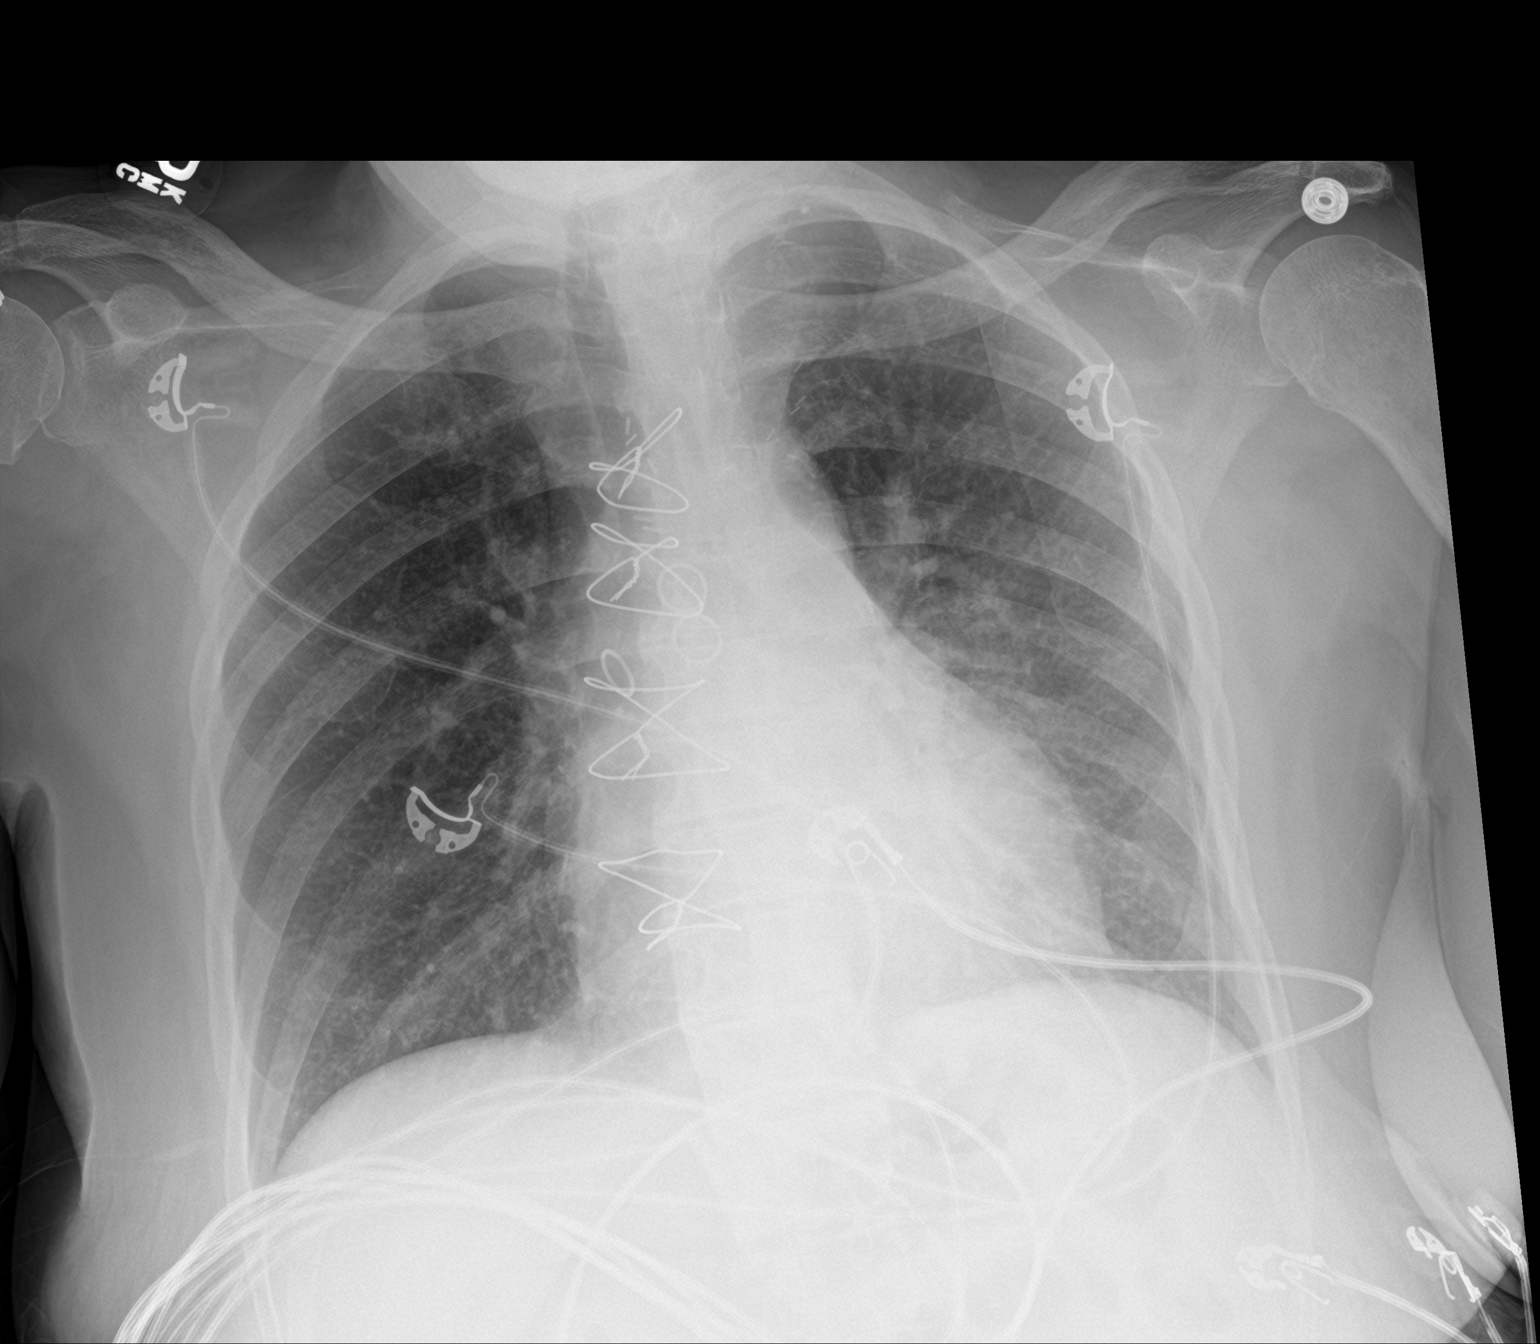

[2 of 2 positions shown; findings below may reference images not displayed]

FINDINGS: The heart size and mediastinal contours are stable. Mild central
pulmonary vascular congestion is identified. There is no frank
pulmonary edema, focal pneumonia or pleural effusion. The visualized
skeletal structures are stable.
IMPRESSION: Mild central pulmonary vascular congestion.  No focal pneumonia.

## 2018-04-24 MED ORDER — IPRATROPIUM-ALBUTEROL 0.5-2.5 (3) MG/3ML IN SOLN
3.0000 mL | Freq: Once | RESPIRATORY_TRACT | Status: AC
  Administered 2018-04-24: 3 mL via RESPIRATORY_TRACT
  Filled 2018-04-24: qty 3

## 2018-04-24 MED ORDER — SODIUM CHLORIDE 0.9 % IV BOLUS
500.0000 mL | Freq: Once | INTRAVENOUS | Status: AC
  Administered 2018-04-24: 500 mL via INTRAVENOUS

## 2018-04-24 MED ORDER — LEVOFLOXACIN 500 MG PO TABS: 500.0000 mg | ORAL_TABLET | ORAL | 0 refills | Status: DC

## 2018-04-24 MED ORDER — IOPAMIDOL (ISOVUE-300) INJECTION 61%
30.0000 mL | Freq: Once | INTRAVENOUS | Status: AC | PRN
  Administered 2018-04-24: 30 mL via ORAL

## 2018-04-24 MED ORDER — LEVOFLOXACIN 750 MG PO TABS
750.0000 mg | ORAL_TABLET | Freq: Once | ORAL | Status: AC
  Administered 2018-04-24: 750 mg via ORAL
  Filled 2018-04-24: qty 1

## 2018-04-24 NOTE — ED Provider Notes (Signed)
Patient turned over to me pending CT scan results from Dr. Lacinda Axon.  Patient CT scan showed no explanation for the elevated lipase.  But will need some outpatient follow-up on that.  Did show evidence of a questionable renal mass that also will require outpatient follow-up.  The more pertinent finding was left lower lobe pneumonia.  Which may explain the patient's shortness of breath but she did not have any leukocytosis.  Patient in no acute respiratory distress.  For this we will treat patient with levofloxacin first dose 750 mg provided here today.  Due to her severe renal insufficiency.  Patient will be dosed 500 mg every 48 hours for the pneumonia.  She will make an appointment to follow-up with her primary care and a week.  She will return for any new or worse symptoms.  Patient and daughter are okay with going home.   Fredia Sorrow, MD 04/24/18 2340

## 2018-04-24 NOTE — ED Provider Notes (Addendum)
[facility] EMERGENCY DEPARTMENT Provider Note   CSN: [id number] Arrival date & time: 04/24/18  1547     History   Chief Complaint Chief Complaint  Patient presents with  . Shortness of Breath    HPI Erin Jordan is a 73 y.o. female.  Cough, dyspnea, wheezing, decreased appetite for 1 week.  No frank epigastric pain, vomiting, diarrhea, fever, chills, diaphoresis, dysuria.  Past medical history includes hypertension, diabetes, hyperlipidemia, CAD, chronic kidney disease.  Severity of symptoms is moderate.  Nothing makes symptoms better or worse.     Past Medical History:  Diagnosis Date  . Arthritis   . Chronic kidney disease, stage 3, mod decreased GFR (HCC)   . Coronary atherosclerosis of native coronary artery 2006   Multivessel status post CABG in [place], graft disease documented March 2019 with DES to SVG to diagonal  . Diabetes mellitus, type 2 ([place])   . Essential hypertension, benign   . 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 Hyperlipidemia     Patient Active Problem List   Diagnosis Date Noted  . Weight loss, unintentional 07/14/2017  . CAD -S/P PCI 06/26/17 06/27/2017  . Acute on chronic renal insufficiency 06/27/2017  . NSTEMI (non-ST elevated myocardial infarction) ([place]) 06/24/2017  . 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 Hx of CABG- 2006   . Diabetes mellitus, type 2 ([place])   . Essential hypertension   . Stage 3 chronic kidney disease ([place])   . Obesity 06/02/2011  . Dyslipidemia, goal LDL below 70 06/02/2011    Past Surgical History:  Procedure Laterality Date  . ABDOMINAL HYSTERECTOMY    . CORONARY ARTERY BYPASS GRAFT  2006   [place], [place]  . CORONARY STENT INTERVENTION N/A 06/25/2017   Procedure: CORONARY STENT INTERVENTION;  Surgeon: Jettie Booze, MD;  Location: [facility];  Service: Cardiovascular;  Laterality: N/A;  . LEFT HEART CATH AND CORS/GRAFTS ANGIOGRAPHY N/A 06/25/2017   Procedure: LEFT HEART CATH  AND CORS/GRAFTS ANGIOGRAPHY;  Surgeon: Jettie Booze, MD;  Location: [facility];  Service: Cardiovascular;  Laterality: N/A;     OB History   No obstetric history on file.      Home Medications    Prior to Admission medications   Medication Sig Start Date End Date Taking? Authorizing Provider  aspirin EC 81 MG tablet Take 1 tablet (81 mg total) by mouth daily. 07/14/17   Imogene Burn, PA-C  atorvastatin (LIPITOR) 40 MG tablet Take 1 tablet (40 mg total) by mouth daily. 04/20/18 07/19/18  Satira Sark, MD  glipiZIDE (GLUCOTROL) 10 MG tablet Take 1 tablet (10 mg total) by mouth daily before breakfast. 07/14/17   Imogene Burn, PA-C  levofloxacin (LEVAQUIN) 500 MG tablet Take 1 tablet (500 mg total) by mouth every other day. 04/24/18   Fredia Sorrow, MD  methimazole (TAPAZOLE) 5 MG tablet Take 1 tablet (5 mg total) by mouth daily. 10/14/17   Cassandria Anger, MD  metoprolol succinate (TOPROL XL) 25 MG 24 hr tablet Take 1 tablet (25 mg total) by mouth daily. 04/20/18   Satira Sark, MD  nitroGLYCERIN (NITROSTAT) 0.4 MG SL tablet Place 1 tablet (0.4 mg total) under the tongue every 5 (five) minutes x 3 doses as needed for chest pain. 12/03/17   Satira Sark, MD  ticagrelor (BRILINTA) 90 MG TABS tablet Take 1 tablet (90 mg total) by mouth 2 (two) times daily. 04/20/18   Satira Sark, MD  Family History Family History  Problem Relation Age of Onset  . Heart attack Mother   . Hypertension Mother     Social History Social History   Tobacco Use  . Smoking status: Never Smoker  . Smokeless tobacco: Never Used  Substance Use Topics  . Alcohol use: No  . Drug use: No     Allergies   Patient has no known allergies.   Review of Systems Review of Systems  All other systems reviewed and are negative.    Physical Exam Updated Vital Signs BP (!) 154/70   Pulse 88   Temp 98.2 F (36.8 C) (Oral)   Resp 17   Ht 5' (1.524 m)   Wt 56.7  kg   SpO2 98%   BMI 24.41 kg/m   Physical Exam Vitals signs and nursing note reviewed.  Constitutional:      Appearance: She is well-developed.     Comments: nad  HENT:     Head: Normocephalic and atraumatic.  Eyes:     Conjunctiva/sclera: Conjunctivae normal.  Neck:     Musculoskeletal: Neck supple.  Cardiovascular:     Rate and Rhythm: Normal rate and regular rhythm.  Pulmonary:     Effort: Pulmonary effort is normal.     Breath sounds: Normal breath sounds.     Comments: Coughing, scattered rhonchi Abdominal:     General: Bowel sounds are normal.     Palpations: Abdomen is soft.     Comments: No epigastric tenderness.  Musculoskeletal: Normal range of motion.  Skin:    General: Skin is warm and dry.  Neurological:     Mental Status: She is alert and oriented to person, place, and time.  Psychiatric:        Behavior: Behavior normal.      ED Treatments / Results  Labs (all labs ordered are listed, but only abnormal results are displayed) Labs Reviewed  CBC WITH DIFFERENTIAL/PLATELET - Abnormal; Notable for the following components:      Result Value   Neutro Abs 8.0 (*)    Abs Immature Granulocytes 0.08 (*)    All other components within normal limits  COMPREHENSIVE METABOLIC PANEL - Abnormal; Notable for the following components:   Potassium 3.2 (*)    CO2 21 (*)    Glucose, Bld 145 (*)    BUN 50 (*)    Creatinine, Ser 2.29 (*)    Albumin 3.4 (*)    AST 57 (*)    ALT 72 (*)    GFR calc non Af Amer 20 (*)    GFR calc Af Amer 24 (*)    All other components within normal limits  LIPASE, BLOOD - Abnormal; Notable for the following components:   Lipase 264 (*)    All other components within normal limits  TROPONIN I    EKG EKG Interpretation  Date/Time:  Friday April 24 2018 16:29:28 EST Ventricular Rate:  85 PR Interval:    QRS Duration: 102 QT Interval:  402 QTC Calculation: 478 R Axis:   20 Text Interpretation:  Sinus rhythm Ventricular  premature complex Borderline low voltage, extremity leads Confirmed by Nat Christen [id number]) on 04/24/2018 4:58:35 PM   Radiology No results found.  Procedures Procedures (including critical care time)  Medications Ordered in ED Medications  sodium chloride 0.9 % bolus 500 mL (0 mLs Intravenous Stopped 04/24/18 2034)  ipratropium-albuterol (DUONEB) 0.5-2.5 (3) MG/3ML nebulizer solution 3 mL (3 mLs Nebulization Given 04/24/18 1819)  iopamidol (ISOVUE-300) 61 %  injection 30 mL (30 mLs Oral Contrast Given 04/24/18 2249)  levofloxacin (LEVAQUIN) tablet 750 mg (750 mg Oral Given 04/24/18 2339)     Initial Impression / Assessment and Plan / ED Course  I have reviewed the triage vital signs and the nursing notes.  Pertinent labs & imaging results that were available during my care of the patient were reviewed by me and considered in my medical decision making (see chart for details).     Patient presents with coughing and wheezing.  Chest x-ray shows no pneumonia.  EKG and troponin negative.  Lipase 264.  Will get non contrasted CT of abdomen pelvis.  Discussed with Dr. Rogene Houston.  Final Clinical Impressions(s) / ED Diagnoses   Final diagnoses:  Elevated lipase  Chronic kidney disease, unspecified CKD stage  Elevated liver enzymes  Community acquired pneumonia of left lower lobe of lung [facility])    ED Discharge Orders         Ordered    levofloxacin (LEVAQUIN) 500 MG tablet  Every 48 hours     04/24/18 2338           Nat Christen, MD 04/24/18 2200    Nat Christen, MD 04/27/18 [phone]

## 2018-04-24 NOTE — ED Notes (Signed)
Patient has pulled IV out at this time. Bleeding controlled. New bandage applied. Patient confused stating that she needs to go check on her sons. Redirected patient back room. Patient wanting to put her clothes on. Sitting in chair waiting for family to return.

## 2018-04-24 NOTE — Discharge Instructions (Addendum)
Close follow-up with her primary care doctor is important.  Today's CT showed evidence of a left lower lobe pneumonia.  Take the antibiotic as directed for the next few days.  Also need to follow-up with a mass on the liver.  And the elevated lipase.  Primary care doctor can recheck those labs.  Return for any new or worse symptoms.  Would expect improvement over the next 2 days.  First dose of antibiotics given here tonight.  Next dose of antibiotics will be on Sunday.

## 2018-04-24 NOTE — ED Triage Notes (Signed)
Pt is having SOB, cough, and diminished appetite for the last week. Wheezing and rhonchi throughout.

## 2018-05-14 ENCOUNTER — Other Ambulatory Visit (HOSPITAL_COMMUNITY): Payer: Self-pay | Admitting: Family Medicine

## 2018-05-14 DIAGNOSIS — Z780 Asymptomatic menopausal state: Secondary | ICD-10-CM

## 2018-05-14 DIAGNOSIS — Z1231 Encounter for screening mammogram for malignant neoplasm of breast: Secondary | ICD-10-CM

## 2018-05-14 DIAGNOSIS — E2839 Other primary ovarian failure: Secondary | ICD-10-CM

## 2018-05-16 ENCOUNTER — Other Ambulatory Visit: Payer: Self-pay | Admitting: Family Medicine

## 2018-05-16 DIAGNOSIS — N2889 Other specified disorders of kidney and ureter: Secondary | ICD-10-CM

## 2018-05-16 DIAGNOSIS — K753 Granulomatous hepatitis, not elsewhere classified: Secondary | ICD-10-CM

## 2018-05-20 ENCOUNTER — Encounter (HOSPITAL_COMMUNITY): Payer: Self-pay

## 2018-05-20 ENCOUNTER — Ambulatory Visit (HOSPITAL_COMMUNITY)
Admission: RE | Admit: 2018-05-20 | Discharge: 2018-05-20 | Disposition: A | Discharge status: Home / Self Care | Payer: Medicare HMO | Source: Ambulatory Visit | Attending: Family Medicine | Admitting: Family Medicine

## 2018-05-20 DIAGNOSIS — M8580 Other specified disorders of bone density and structure, unspecified site: Secondary | ICD-10-CM | POA: Insufficient documentation

## 2018-05-20 DIAGNOSIS — E2839 Other primary ovarian failure: Secondary | ICD-10-CM | POA: Insufficient documentation

## 2018-05-20 DIAGNOSIS — Z1231 Encounter for screening mammogram for malignant neoplasm of breast: Secondary | ICD-10-CM

## 2018-05-20 DIAGNOSIS — Z780 Asymptomatic menopausal state: Secondary | ICD-10-CM

## 2018-05-20 IMAGING — MG DIGITAL SCREENING BILATERAL MAMMOGRAM WITH TOMO AND CAD
6 of 10 series · 6 of 30 positions shown · non-contrast
Comparison: Previous exam(s).

CLINICAL DATA: Screening.

EXAM:
DIGITAL SCREENING BILATERAL MAMMOGRAM WITH TOMO AND CAD

[L MLO synth-2D (1 of 2)]
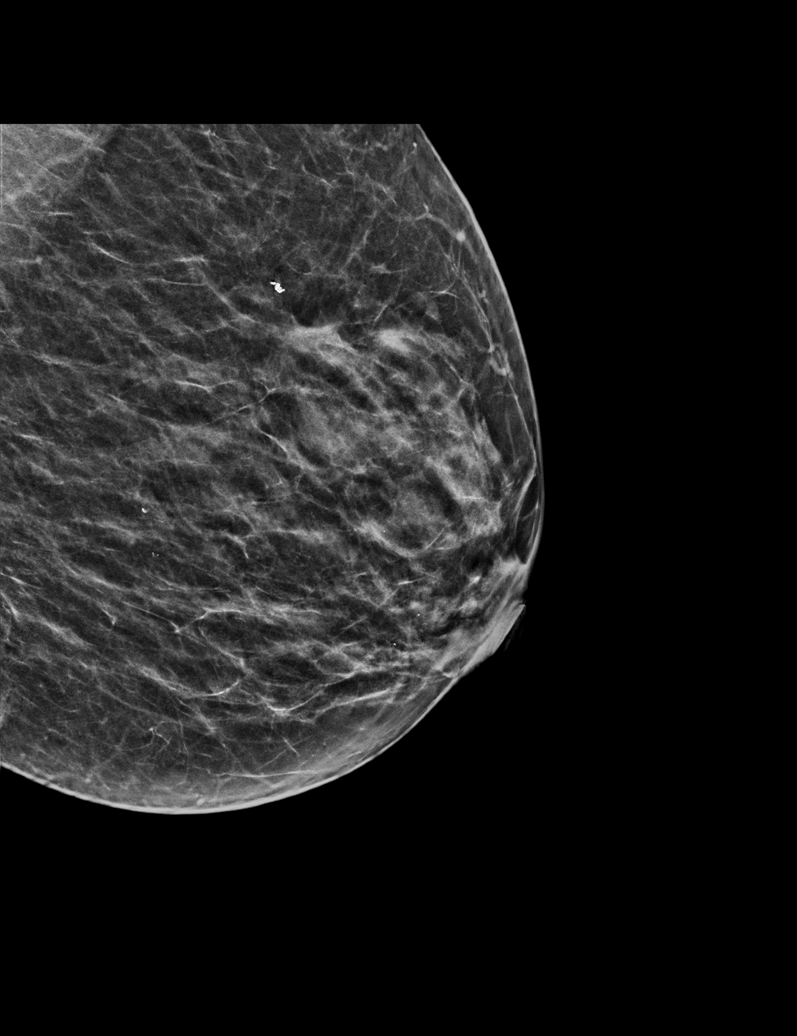

[L MLO synth-2D (2 of 2)]
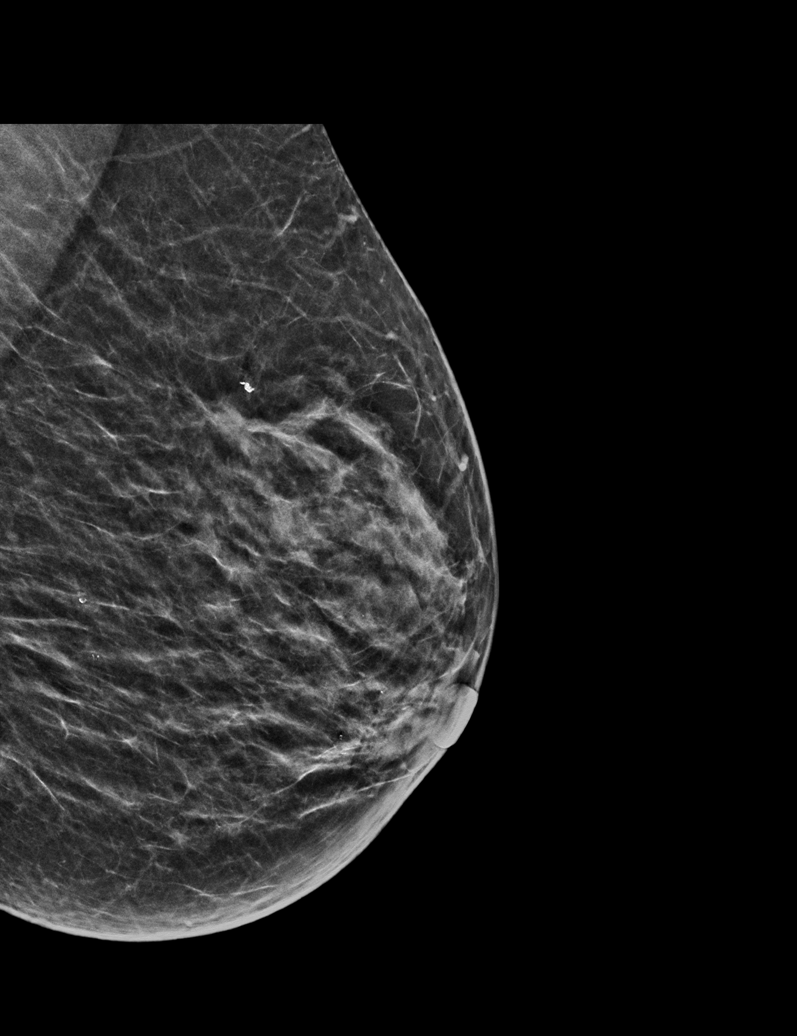

[R MLO synth-2D]
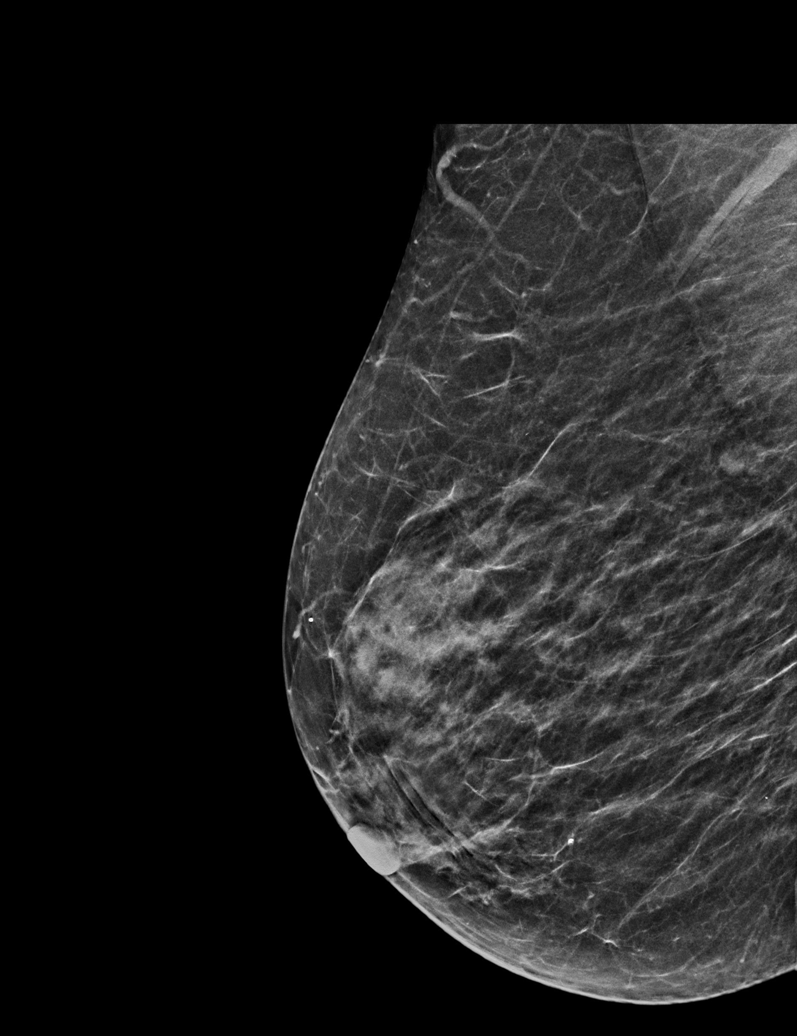

[L CC synth-2D]
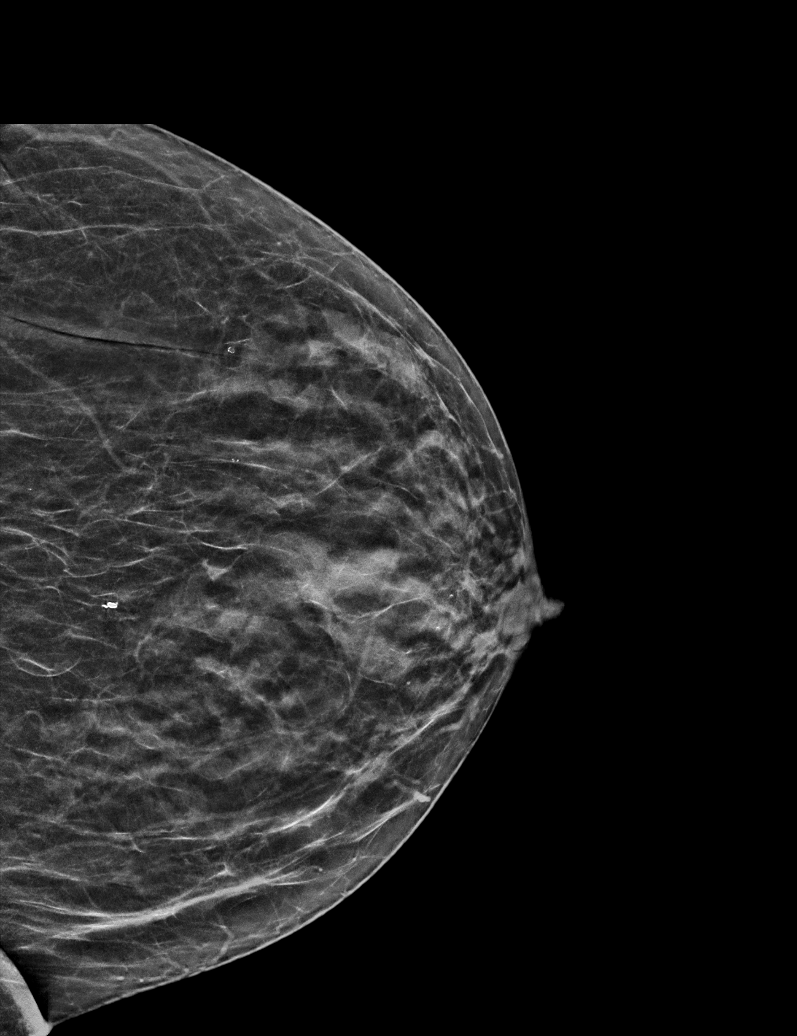

[R CC synth-2D]
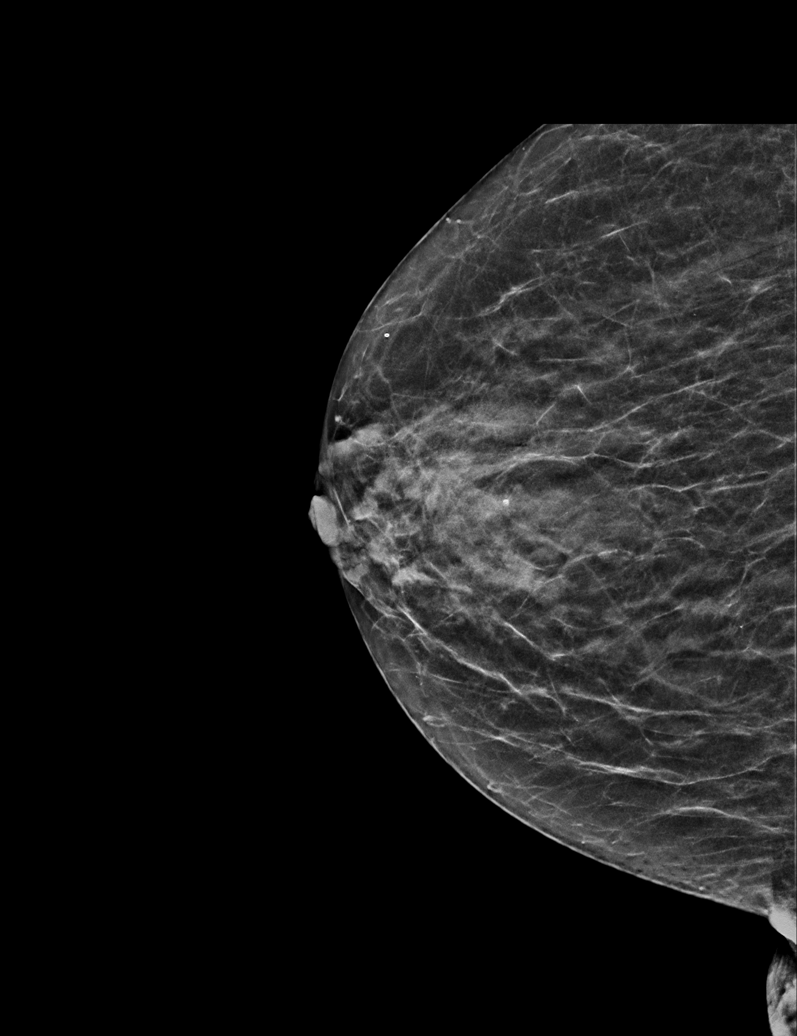

[L MLO tomo · tomo slice 25/50.0]
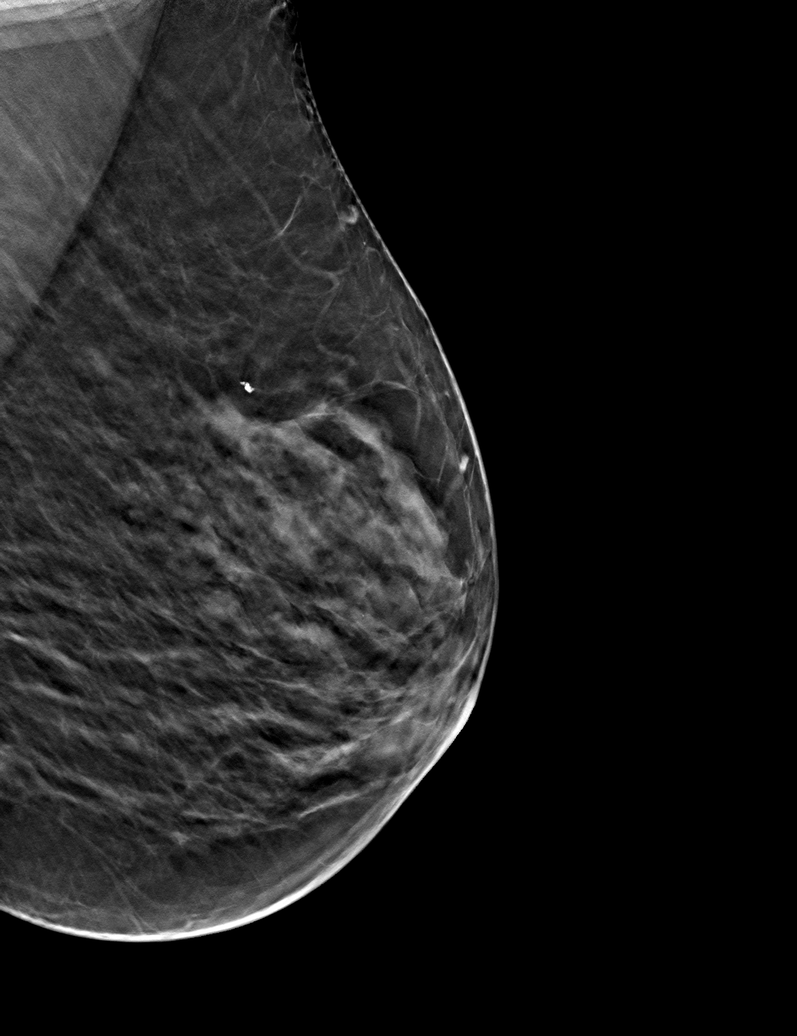

[6 of 30 positions shown; findings below may reference images not displayed]

ACR Breast Density Category c: The breast tissue is heterogeneously
dense, which may obscure small masses.
FINDINGS: In the left breast, possible distortion warrants further evaluation.
In the right breast, no findings suspicious for malignancy. Images
were processed with CAD.
IMPRESSION: Further evaluation is suggested for possible distortion in the left
breast.

RECOMMENDATION:
Diagnostic mammogram and possibly ultrasound of the left breast.
(Code:[JM])

The patient will be contacted regarding the findings, and additional
imaging will be scheduled.

BI-RADS CATEGORY  0: Incomplete. Need additional imaging evaluation
and/or prior mammograms for comparison.

## 2018-05-21 ENCOUNTER — Other Ambulatory Visit (HOSPITAL_COMMUNITY): Payer: Self-pay | Admitting: Family Medicine

## 2018-05-21 DIAGNOSIS — R928 Other abnormal and inconclusive findings on diagnostic imaging of breast: Secondary | ICD-10-CM

## 2018-05-27 ENCOUNTER — Inpatient Hospital Stay: Admission: RE | Admit: 2018-05-27 | Payer: Medicare HMO | Source: Ambulatory Visit

## 2018-06-02 ENCOUNTER — Ambulatory Visit (HOSPITAL_COMMUNITY): Payer: Medicare HMO

## 2018-06-02 ENCOUNTER — Ambulatory Visit (HOSPITAL_COMMUNITY)
Admission: RE | Admit: 2018-06-02 | Discharge: 2018-06-02 | Disposition: A | Discharge status: Home / Self Care | Payer: Medicare HMO | Source: Ambulatory Visit | Attending: Family Medicine | Admitting: Family Medicine

## 2018-06-02 DIAGNOSIS — R928 Other abnormal and inconclusive findings on diagnostic imaging of breast: Secondary | ICD-10-CM | POA: Diagnosis present

## 2018-06-02 IMAGING — MG DIGITAL DIAGNOSTIC UNILATERAL LEFT MAMMOGRAM WITH TOMO AND CAD
8 series · 8 of 24 positions shown · non-contrast
Comparison: Previous exam(s).

CLINICAL DATA: Screening recall for possible left breast
distortion.

EXAM:
DIGITAL DIAGNOSTIC UNILATERAL LEFT MAMMOGRAM WITH CAD AND TOMO

[L MLO synth-2D (1 of 2)]
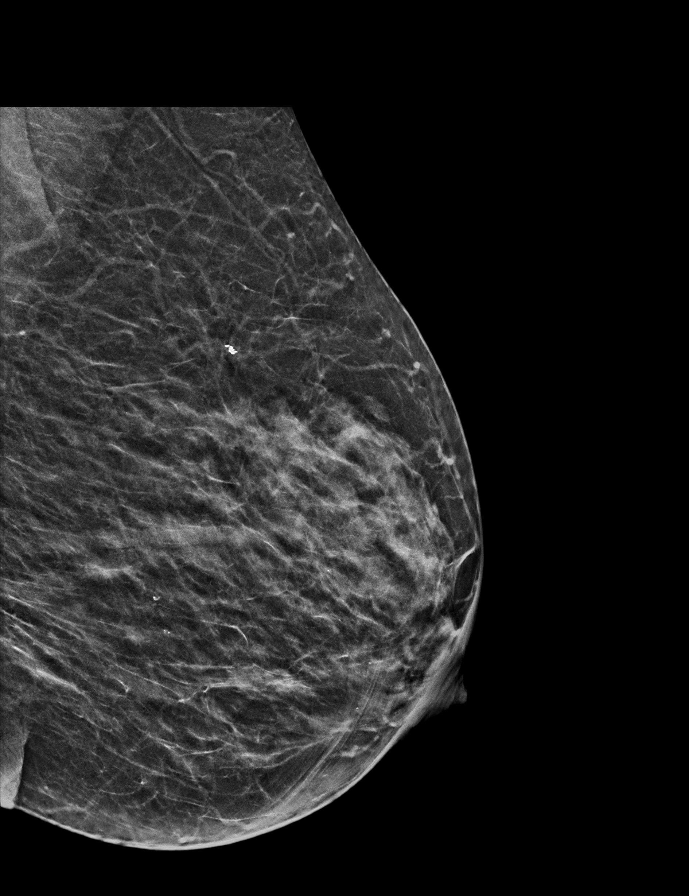

[L CC synth-2D]
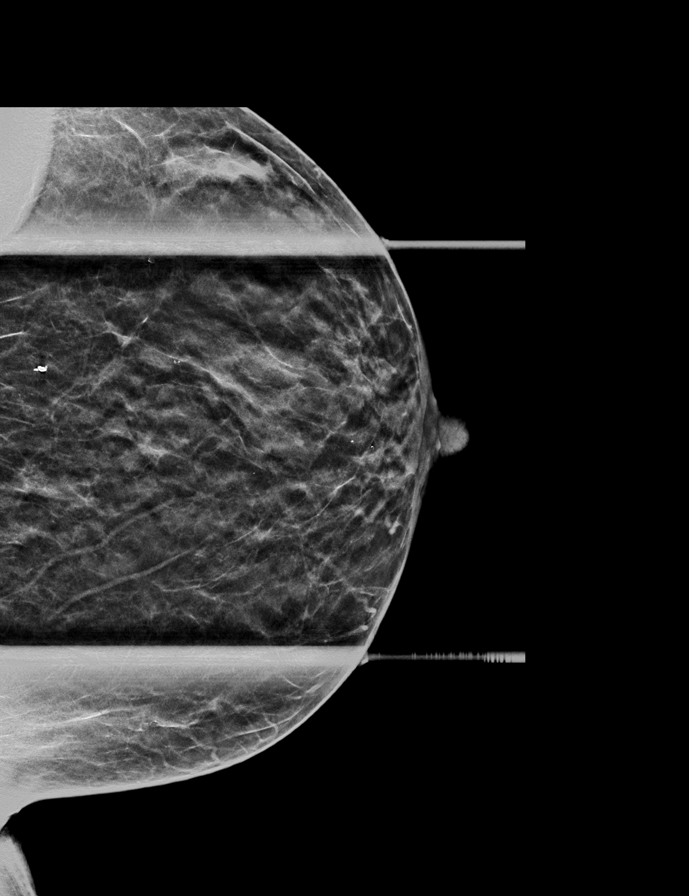

[L MLO synth-2D (2 of 2)]
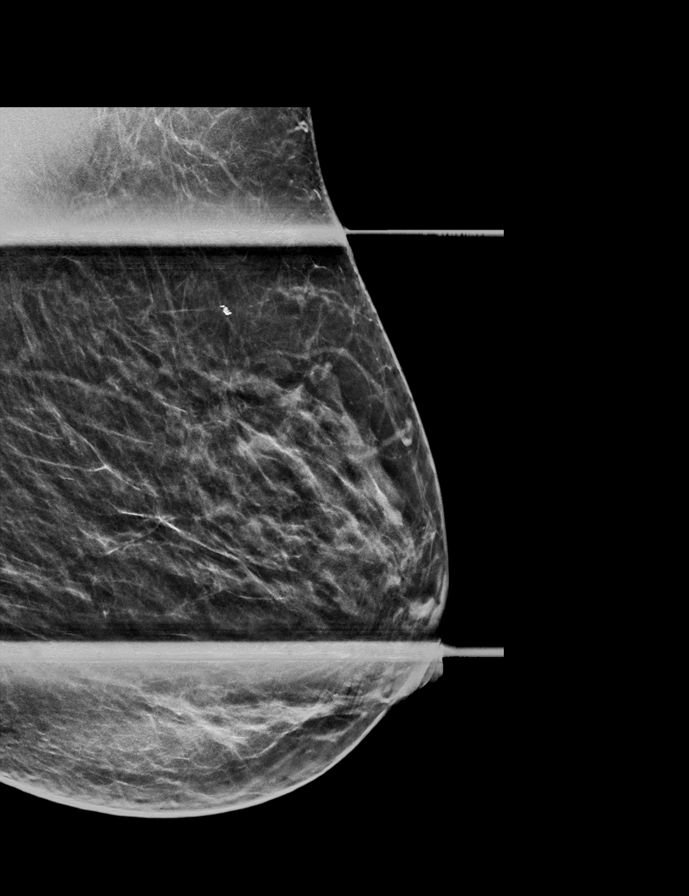

[L ML synth-2D]
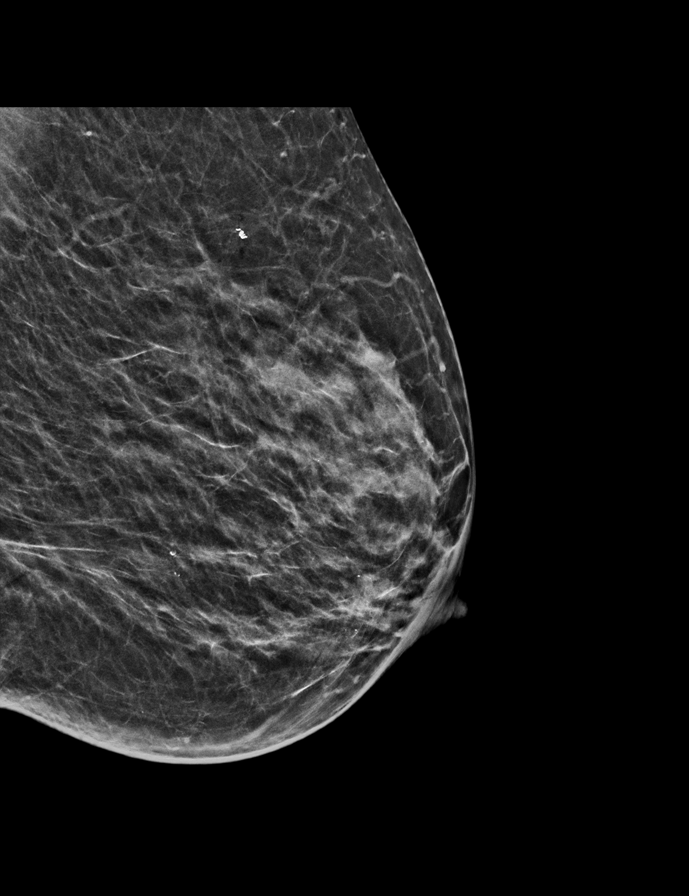

[L ML tomo · tomo slice 25/49.0]
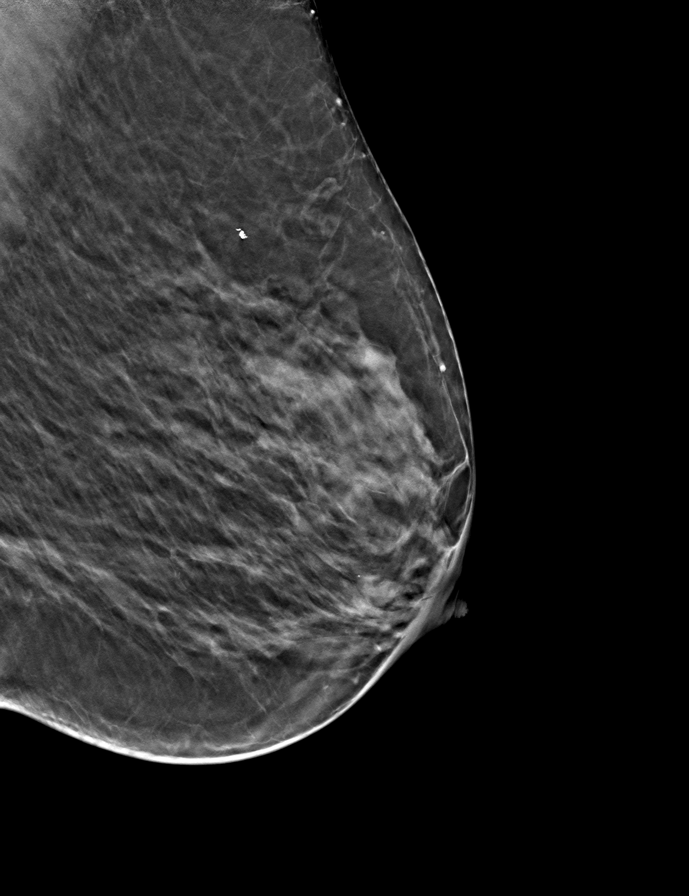

[L MLO tomo (1 of 2) · tomo slice 25/48.0]
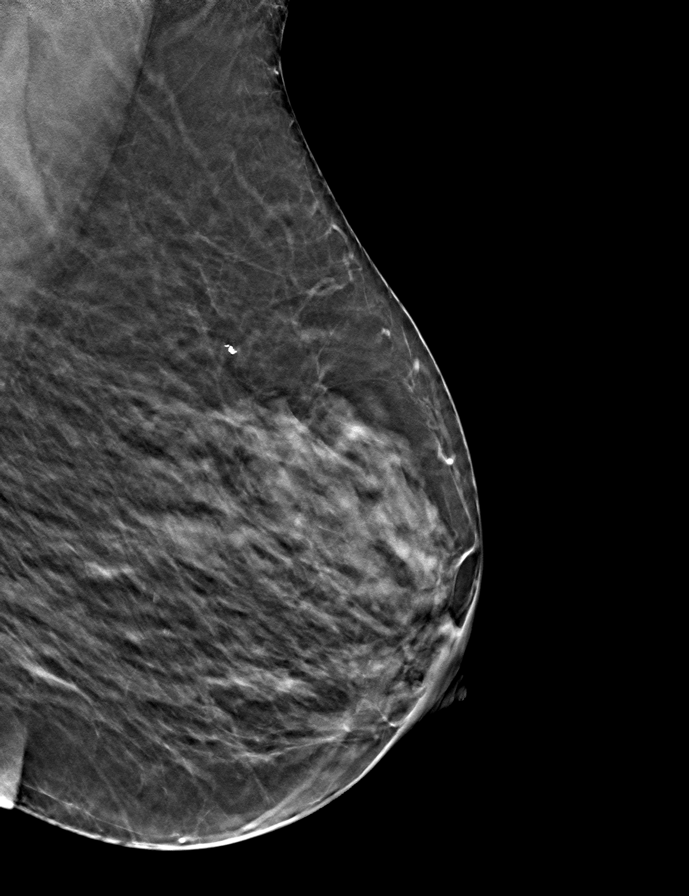

[L CC tomo · tomo slice 20/39.0]
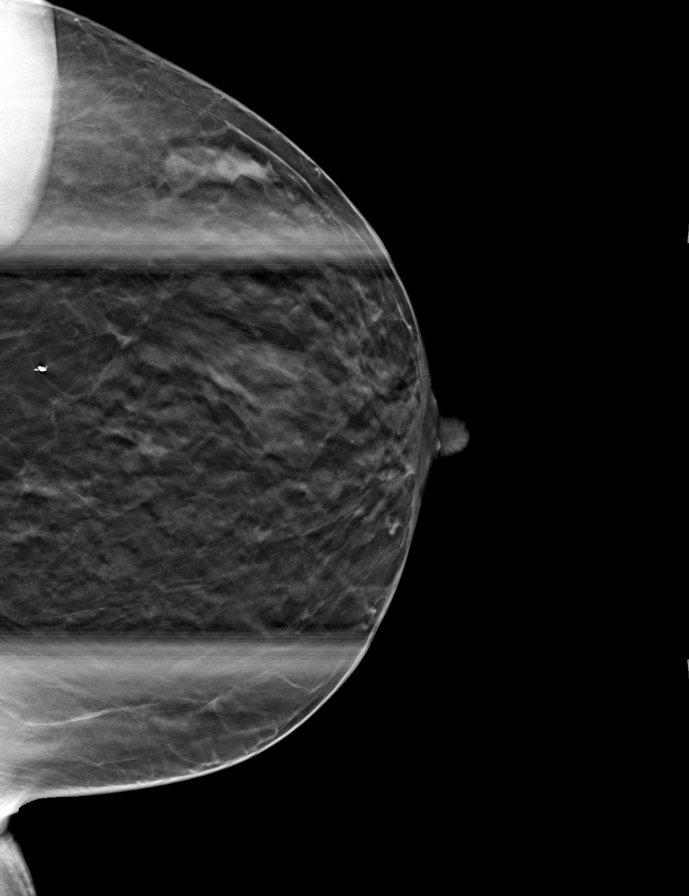

[L MLO tomo (2 of 2) · tomo slice 24/47.0]
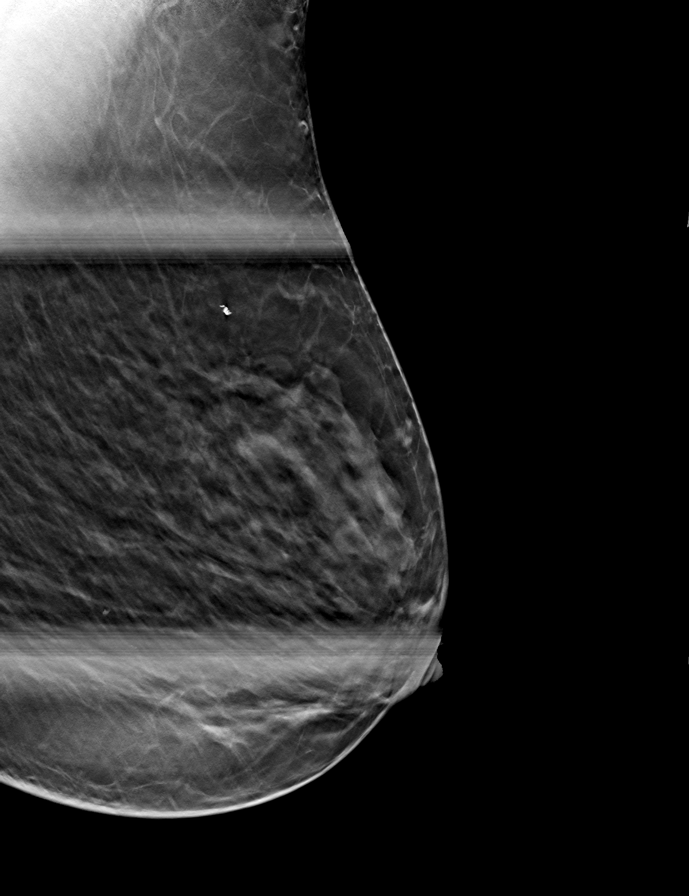

[8 of 24 positions shown; findings below may reference images not displayed]

ACR Breast Density Category c: The breast tissue is heterogeneously
dense, which may obscure small masses.
FINDINGS: Additional tomograms were performed of the left breast. The
initially questioned possible left breast distortion nearly
completely resolves and localizes towards the skin with findings
compatible with a surgical scar. A scar marker is seen in this
location on prior diagnostic mammogram dated [DATE].

Mammographic images were processed with CAD.

Physical examination of the left breast demonstrates a faint
curvilinear scar in the upper-outer left breast at the approximate 1
to 2 o'clock position.
IMPRESSION: Initially questioned possible left breast distortion is related to
an old surgical scar. There is no mammographic evidence of
malignancy in the left breast.

RECOMMENDATION:
Screening mammogram in one year.(Code:[51])

I have discussed the findings and recommendations with the patient.
Results were also provided in writing at the conclusion of the
visit. If applicable, a reminder letter will be sent to the patient
regarding the next appointment.

BI-RADS CATEGORY  2: Benign.

## 2018-06-06 ENCOUNTER — Inpatient Hospital Stay: Admission: RE | Admit: 2018-06-06 | Payer: Medicare HMO | Source: Ambulatory Visit

## 2018-06-08 ENCOUNTER — Ambulatory Visit: Payer: Medicare HMO | Admitting: Cardiology

## 2018-06-08 DIAGNOSIS — R0989 Other specified symptoms and signs involving the circulatory and respiratory systems: Secondary | ICD-10-CM

## 2018-06-08 NOTE — Progress Notes (Deleted)
Cardiology Office Note  Date: 06/08/2018   ID: Erin Jordan, DOB [birth year], MRN [id number]  PCP: Sandi Mealy, MD  Primary Cardiologist: Rozann Lesches, MD   No chief complaint on file.   History of Present Illness: Erin Jordan is a 73 y.o. female last seen in October 2019.  Past Medical History:  Diagnosis Date  . Arthritis   . Chronic kidney disease, stage 3, mod decreased GFR (HCC)   . Coronary atherosclerosis of native coronary artery 2006   Multivessel status post CABG in [place], graft disease documented March 2019 with DES to SVG to diagonal  . Diabetes mellitus, type 2 ([place])   . Essential hypertension, benign   . 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 Hyperlipidemia     Past Surgical History:  Procedure Laterality Date  . ABDOMINAL HYSTERECTOMY    . CORONARY ARTERY BYPASS GRAFT  2006   [place], [place]  . CORONARY STENT INTERVENTION N/A 06/25/2017   Procedure: CORONARY STENT INTERVENTION;  Surgeon: Jettie Booze, MD;  Location: [facility];  Service: Cardiovascular;  Laterality: N/A;  . LEFT HEART CATH AND CORS/GRAFTS ANGIOGRAPHY N/A 06/25/2017   Procedure: LEFT HEART CATH AND CORS/GRAFTS ANGIOGRAPHY;  Surgeon: Jettie Booze, MD;  Location: [facility];  Service: Cardiovascular;  Laterality: N/A;    Current Outpatient Medications  Medication Sig Dispense Refill  . aspirin EC 81 MG tablet Take 1 tablet (81 mg total) by mouth daily. 90 tablet 3  . atorvastatin (LIPITOR) 40 MG tablet Take 1 tablet (40 mg total) by mouth daily. 90 tablet 3  . glipiZIDE (GLUCOTROL) 10 MG tablet Take 1 tablet (10 mg total) by mouth daily before breakfast. 30 tablet 0  . levofloxacin (LEVAQUIN) 500 MG tablet Take 1 tablet (500 mg total) by mouth every other day. 5 tablet 0  . methimazole (TAPAZOLE) 5 MG tablet Take 1 tablet (5 mg total) by mouth daily. 30 tablet 2  . metoprolol succinate (TOPROL XL) 25 MG 24 hr tablet Take 1 tablet  (25 mg total) by mouth daily. 90 tablet 3  . nitroGLYCERIN (NITROSTAT) 0.4 MG SL tablet Place 1 tablet (0.4 mg total) under the tongue every 5 (five) minutes x 3 doses as needed for chest pain. 25 tablet 3  . ticagrelor (BRILINTA) 90 MG TABS tablet Take 1 tablet (90 mg total) by mouth 2 (two) times daily. 180 tablet 3   No current facility-administered medications for this visit.    Allergies:  Patient has no known allergies.   Social History: The patient  reports that she has never smoked. She has never used smokeless tobacco. She reports that she does not drink alcohol or use drugs.   Family History: The patient's family history includes Heart attack in her mother; Hypertension in her mother.   ROS:  Please see the history of present illness. Otherwise, complete review of systems is positive for {NONE DEFAULTED:18576::"none"}.  All other systems are reviewed and negative.   Physical Exam: VS:  There were no vitals taken for this visit., BMI There is no height or weight on file to calculate BMI.  Wt Readings from Last 3 Encounters:  04/24/18 125 lb (56.7 kg)  01/29/18 124 lb (56.2 kg)  12/03/17 122 lb (55.3 kg)    General: Patient appears comfortable at rest. HEENT: Conjunctiva and lids normal, oropharynx clear with moist mucosa. Neck: Supple, no elevated JVP or carotid bruits, no thyromegaly. Lungs: Clear to auscultation, nonlabored breathing  at rest. Cardiac: Regular rate and rhythm, no S3 or significant systolic murmur, no pericardial rub. Abdomen: Soft, nontender, no hepatomegaly, bowel sounds present, no guarding or rebound. Extremities: No pitting edema, distal pulses 2+. Skin: Warm and dry. Musculoskeletal: No kyphosis. Neuropsychiatric: Alert and oriented x3, affect grossly appropriate.  ECG: I personally reviewed the tracing from 04/24/2018 which showed sinus rhythm with lead motion artifact and PVC.  Recent Labwork: 06/24/2017: Magnesium 2.2 04/24/2018: ALT 72; AST 57;  BUN 50; Creatinine, Ser 2.29; Hemoglobin 12.2; Platelets 281; Potassium 3.2; Sodium 137     Component Value Date/Time   CHOL 200 06/02/2011 0431   TRIG 91 06/02/2011 0431   HDL 45 06/02/2011 0431   CHOLHDL 4.4 06/02/2011 0431   VLDL 18 06/02/2011 0431   LDLCALC 137 (H) 06/02/2011 0431    Other Studies Reviewed Today:  Cardiac catheterization and PCI 06/25/2017:  Ost 1st Diag to 1st Diag lesion is 80% stenosed. THis is a long lesion nad is not grafted.  Prox LAD to Mid LAD lesion is 100% stenosed. LIMA to LAD is patent.  Lat 1st Mrg lesion is 95% stenosed.  Ost 1st Mrg to 1st Mrg lesion is 95% stenosed. SVG to OM is occluded.  Prox RCA lesion is 100% stenosed. SVG to PDA is occluded. This appears recent.  Ost 2nd Diag to 2nd Diag lesion is 100% stenosed. SVG to Diagonal with ostial 75% lesion.  A drug-eluting stent was successfully placed using a STENT SIERRA 4.00 X 15 MM.  Post intervention, there is a 0% residual stenosis.  The left ventricular ejection fraction is 50-55% by visual estimate.  There is no aortic valve stenosis.  Continue aggressive medical therapy. Complex circumflex bifurcation lesion. Increase antianginals. Would likely lose the large lateral branch of the OM1 if PCI were performed.  CT soft tissue neck 12/22/2017: IMPRESSION: Massive thyroid goiter. Scattered calcifications. Minor cystic changes. Intrathoracic extension left more than right. Displacement of the hypopharynx and larynx. Extrinsic mass effect upon the esophagus. Lateral displacement of the vascular structures.  Assessment and Plan:   Current medicines were reviewed with the patient today.  No orders of the defined types were placed in this encounter.   Disposition:  Signed, Satira Sark, MD, [facility] 06/08/2018 8:16 AM    [place] [facility] [place]. [address] [place], [place] [address] Phone: [phone]; Fax: [phone]

## 2018-06-09 ENCOUNTER — Encounter: Payer: Self-pay | Admitting: Cardiology

## 2018-06-29 ENCOUNTER — Other Ambulatory Visit: Payer: Self-pay | Admitting: "Endocrinology

## 2018-07-01 ENCOUNTER — Inpatient Hospital Stay: Admission: RE | Admit: 2018-07-01 | Payer: Medicare HMO | Source: Ambulatory Visit

## 2018-07-07 ENCOUNTER — Other Ambulatory Visit: Payer: Medicare HMO

## 2018-08-26 ENCOUNTER — Telehealth: Payer: Self-pay | Admitting: Student

## 2018-08-26 NOTE — Telephone Encounter (Signed)
Virtual Visit Pre-Appointment Phone Call  "(Name), I am calling you today to discuss your upcoming appointment. We are currently trying to limit exposure to the virus that causes COVID-19 by seeing patients at home rather than in the office."  1. "What is the BEST phone number to call the day of the visit?" - include this in appointment notes  2. Do you have or have access to (through a family member/friend) a smartphone with video capability that we can use for your visit?" a. If yes - list this number in appt notes as cell (if different from BEST phone #) and list the appointment type as a VIDEO visit in appointment notes b. If no - list the appointment type as a PHONE visit in appointment notes  3. Confirm consent - "In the setting of the current Covid19 crisis, you are scheduled for a (phone or video) visit with your provider on (date) at (time).  Just as we do with many in-office visits, in order for you to participate in this visit, we must obtain consent.  If you'd like, I can send this to your mychart (if signed up) or email for you to review.  Otherwise, I can obtain your verbal consent now.  All virtual visits are billed to your insurance company just like a normal visit would be.  By agreeing to a virtual visit, we'd like you to understand that the technology does not allow for your provider to perform an examination, and thus may limit your provider's ability to fully assess your condition. If your provider identifies any concerns that need to be evaluated in person, we will make arrangements to do so.  Finally, though the technology is pretty good, we cannot assure that it will always work on either your or our end, and in the setting of a video visit, we may have to convert it to a phone-only visit.  In either situation, we cannot ensure that we have a secure connection.  Are you willing to proceed?" STAFF: Did the patient verbally acknowledge consent to telehealth visit? Document  YES/NO here: YES  4. Advise patient to be prepared - "Two hours prior to your appointment, go ahead and check your blood pressure, pulse, oxygen saturation, and your weight (if you have the equipment to check those) and write them all down. When your visit starts, your provider will ask you for this information. If you have an Apple Watch or Kardia device, please plan to have heart rate information ready on the day of your appointment. Please have a pen and paper handy nearby the day of the visit as well."  5. Give patient instructions for MyChart download to smartphone OR Doximity/Doxy.me as below if video visit (depending on what platform provider is using)  6. Inform patient they will receive a phone call 15 minutes prior to their appointment time (may be from unknown caller ID) so they should be prepared to answer    TELEPHONE CALL NOTE  Erin Jordan has been deemed a candidate for a follow-up tele-health visit to limit community exposure during the Covid-19 pandemic. I spoke with the patient via phone to ensure availability of phone/video source, confirm preferred email & phone number, and discuss instructions and expectations.  I reminded Erin Jordan to be prepared with any vital sign and/or heart rhythm information that could potentially be obtained via home monitoring, at the time of her visit. I reminded Erin Jordan to expect a phone call prior to  her visit.  [name] 08/26/2018 3:32 PM   INSTRUCTIONS FOR DOWNLOADING THE MYCHART APP TO SMARTPHONE  - The patient must first make sure to have activated MyChart and know their login information - If Apple, go to [employer] and type in MyChart in the search bar and download the app. If Android, ask patient to go to [employer] and type in [place] in the search bar and download the app. The app is free but as with any other app downloads, their phone may require them to verify saved payment information or Apple/Android  password.  - The patient will need to then log into the app with their MyChart username and password, and select [facility] as their healthcare provider to link the account. When it is time for your visit, go to the MyChart app, find appointments, and click Begin Video Visit. Be sure to Select Allow for your device to access the Microphone and Camera for your visit. You will then be connected, and your provider will be with you shortly.  **If they have any issues connecting, or need assistance please contact MyChart service desk [phone])**  **If using a computer, in order to ensure the best quality for their visit they will need to use either of the following Internet Browsers: [employer], or Google Chrome**  IF USING DOXIMITY or DOXY.ME - The patient will receive a link just prior to their visit by text.     FULL LENGTH CONSENT FOR TELE-HEALTH VISIT   I hereby voluntarily request, consent and authorize [facility] and its employed or contracted physicians, physician assistants, nurse practitioners or other licensed health care professionals (the Practitioner), to provide me with telemedicine health care services (the Services") as deemed necessary by the treating Practitioner. I acknowledge and consent to receive the Services by the Practitioner via telemedicine. I understand that the telemedicine visit will involve communicating with the Practitioner through live audiovisual communication technology and the disclosure of certain medical information by electronic transmission. I acknowledge that I have been given the opportunity to request an in-person assessment or other available alternative prior to the telemedicine visit and am voluntarily participating in the telemedicine visit.  I understand that I have the right to withhold or withdraw my consent to the use of telemedicine in the course of my care at any time, without affecting my right to future care or treatment,  and that the Practitioner or I may terminate the telemedicine visit at any time. I understand that I have the right to inspect all information obtained and/or recorded in the course of the telemedicine visit and may receive copies of available information for a reasonable fee.  I understand that some of the potential risks of receiving the Services via telemedicine include:   Delay or interruption in medical evaluation due to technological equipment failure or disruption;  Information transmitted may not be sufficient (e.g. poor resolution of images) to allow for appropriate medical decision making by the Practitioner; and/or   In rare instances, security protocols could fail, causing a breach of personal health information.  Furthermore, I acknowledge that it is my responsibility to provide information about my medical history, conditions and care that is complete and accurate to the best of my ability. I acknowledge that Practitioner's advice, recommendations, and/or decision may be based on factors not within their control, such as incomplete or inaccurate data provided by me or distortions of diagnostic images or specimens that may result from electronic transmissions. I  understand that the practice of medicine is not an exact science and that Practitioner makes no warranties or guarantees regarding treatment outcomes. I acknowledge that I will receive a copy of this consent concurrently upon execution via email to the email address I last provided but may also request a printed copy by calling the office of [facility].    I understand that my insurance will be billed for this visit.   I have read or had this consent read to me.  I understand the contents of this consent, which adequately explains the benefits and risks of the Services being provided via telemedicine.   I have been provided ample opportunity to ask questions regarding this consent and the Services and have had my questions  answered to my satisfaction.  I give my informed consent for the services to be provided through the use of telemedicine in my medical care  By participating in this telemedicine visit I agree to the above.

## 2018-09-01 ENCOUNTER — Encounter: Payer: Self-pay | Admitting: Student

## 2018-09-01 ENCOUNTER — Telehealth (INDEPENDENT_AMBULATORY_CARE_PROVIDER_SITE_OTHER): Payer: Medicare HMO | Admitting: Student

## 2018-09-01 VITALS — BP 132/72 | HR 77 | Ht 61.0 in | Wt 120.0 lb

## 2018-09-01 DIAGNOSIS — Z7189 Other specified counseling: Secondary | ICD-10-CM

## 2018-09-01 DIAGNOSIS — I10 Essential (primary) hypertension: Secondary | ICD-10-CM

## 2018-09-01 DIAGNOSIS — I2510 Atherosclerotic heart disease of native coronary artery without angina pectoris: Secondary | ICD-10-CM | POA: Diagnosis not present

## 2018-09-01 DIAGNOSIS — E785 Hyperlipidemia, unspecified: Secondary | ICD-10-CM

## 2018-09-01 NOTE — Progress Notes (Addendum)
Virtual Visit via Telephone Note   This visit type was conducted due to national recommendations for restrictions regarding the COVID-19 Pandemic (e.g. social distancing) in an effort to limit this patient's exposure and mitigate transmission in our community.  Due to her co-morbid illnesses, this patient is at least at moderate risk for complications without adequate follow up.  This format is felt to be most appropriate for this patient at this time.  The patient did not have access to video technology/had technical difficulties with video requiring transitioning to audio format only (telephone).  All issues noted in this document were discussed and addressed.  No physical exam could be performed with this format.  Please refer to the patient's chart for her  consent to telehealth for [facility].   Date:  09/02/2018   ID:  Erin Jordan, DOB 09/04/1945, MRN [id number]  Patient Location: Home Provider Location: Home  PCP:  Sandi Mealy, MD  Cardiologist:  Rozann Lesches, MD  Electrophysiologist:  None   Evaluation Performed:  Follow-Up Visit  Chief Complaint:  Overdue Follow-up  History of Present Illness:    Erin Jordan is a 73 y.o. female with past medical history of CAD (s/p CABG in 2006, cath in 06/2017 showing patent LIMA-LAD with occluded SVG-OM and SVG-PDA with 75% stenosis of SVG-D1 which was treated with DESx1), HTN, HLD, and thyroid goiter who presents for overdue follow-up.   She was last examined by Dr. Domenic Polite in 01/2018 and denied any recent chest pain or dyspnea on exertion at that time.  She had recently been evaluated by Dr. Harlow Asa who recommended a total thyroidectomy in the setting of her large thyroid goiter but it was recommended to postpone this if able until 06/2018 once she had completed a full year of DAPT.   In talking with the patient and her granddaughter today, she reports overall doing well from a cardiac perspective since her last office  visit. Denies any recent chest pain or dyspnea on exertion. No recent orthopnea, PND, or lower extremity edema. She never underwent thyroidectomy due to COVID-19.   Most history is provided by her granddaughter as she reports the patient is starting to experience more memory issues. They typically check her BP a few times a week and this has been well-controlled. Was elevated with SBP in the 160's earlier today but she reports the patient was upset at that time.   The patient does not have symptoms concerning for COVID-19 infection (fever, chills, cough, or new shortness of breath).    Past Medical History:  Diagnosis Date  . Arthritis   . Chronic kidney disease, stage 3, mod decreased GFR (HCC)   . Coronary atherosclerosis of native coronary artery 2006   Multivessel status post CABG in [place], graft disease documented March 2019 with DES to SVG to diagonal  . Diabetes mellitus, type 2 ([place])   . Essential hypertension, benign   . 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 Hyperlipidemia    Past Surgical History:  Procedure Laterality Date  . ABDOMINAL HYSTERECTOMY    . CORONARY ARTERY BYPASS GRAFT  2006   [place], [place]  . CORONARY STENT INTERVENTION N/A 06/25/2017   Procedure: CORONARY STENT INTERVENTION;  Surgeon: Jettie Booze, MD;  Location: [facility];  Service: Cardiovascular;  Laterality: N/A;  . LEFT HEART CATH AND CORS/GRAFTS ANGIOGRAPHY N/A 06/25/2017   Procedure: LEFT HEART CATH AND CORS/GRAFTS ANGIOGRAPHY;  Surgeon: Jettie Booze, MD;  Location: [facility];  Service: Cardiovascular;  Laterality: N/A;     Current Meds  Medication Sig  . aspirin EC 81 MG tablet Take 1 tablet (81 mg total) by mouth daily.  Mar[place] Kitchen atorvastatin (LIPITOR) 40 MG tablet Take 1 tablet (40 mg total) by mouth daily.  . methimazole (TAPAZOLE) 5 MG tablet TAKE ONE TABLET BY MOUTH DAILY.  . metoprolol succinate (TOPROL XL) 25 MG 24 hr tablet Take 1 tablet (25 mg  total) by mouth daily.  . nitroGLYCERIN (NITROSTAT) 0.4 MG SL tablet Place 1 tablet (0.4 mg total) under the tongue every 5 (five) minutes x 3 doses as needed for chest pain.  . ticagrelor (BRILINTA) 90 MG TABS tablet Take 1 tablet (90 mg total) by mouth 2 (two) times daily.     Allergies:   Patient has no known allergies.   Social History   Tobacco Use  . Smoking status: Never Smoker  . Smokeless tobacco: Never Used  Substance Use Topics  . Alcohol use: No  . Drug use: No     Family Hx: The patient's family history includes Heart attack in her mother; Hypertension in her mother.  ROS:   Please see the history of present illness.     All other systems reviewed and are negative.   Prior CV studies:   The following studies were reviewed today:  Cardiac Catheterization: 06/2017   Ost 1st Diag to 1st Diag lesion is 80% stenosed. THis is a long lesion nad is not grafted.  Prox LAD to Mid LAD lesion is 100% stenosed. LIMA to LAD is patent.  Lat 1st Mrg lesion is 95% stenosed.  Ost 1st Mrg to 1st Mrg lesion is 95% stenosed. SVG to OM is occluded.  Prox RCA lesion is 100% stenosed. SVG to PDA is occluded. This appears recent.  Ost 2nd Diag to 2nd Diag lesion is 100% stenosed. SVG to Diagonal with ostial 75% lesion.  A drug-eluting stent was successfully placed using a STENT SIERRA 4.00 X 15 MM.  Post intervention, there is a 0% residual stenosis.  The left ventricular ejection fraction is 50-55% by visual estimate.  There is no aortic valve stenosis.   Continue aggressive medical therapy.  Complex circumflex bifurcation lesion.  Increase antianginals.  Would likely lose the large lateral branch of the OM1 if PCI were performed.   Labs/Other Tests and Data Reviewed:    EKG:  No ECG reviewed.  Recent Labs: 04/24/2018: ALT 72; BUN 50; Creatinine, Ser 2.29; Hemoglobin 12.2; Platelets 281; Potassium 3.2; Sodium 137   Recent Lipid Panel Lab Results  Component Value  Date/Time   CHOL 200 06/02/2011 04:31 AM   TRIG 91 06/02/2011 04:31 AM   HDL 45 06/02/2011 04:31 AM   CHOLHDL 4.4 06/02/2011 04:31 AM   LDLCALC 137 (H) 06/02/2011 04:31 AM    Wt Readings from Last 3 Encounters:  09/01/18 120 lb (54.4 kg)  04/24/18 125 lb (56.7 kg)  01/29/18 124 lb (56.2 kg)     Objective:    Vital Signs:  BP 132/72   Pulse 77   Ht 5\' 1"  (1.549 m)   Wt 120 lb (54.4 kg)   BMI 22.67 kg/m    General: Pleasant female sounding in NAD Psych: Normal affect. Neuro: Alert and oriented X 3.   ASSESSMENT & PLAN:    1. CAD - she is s/p CABG in 2006 with catheterization in 06/2017 showing patent LIMA-LAD with occluded SVG-OM and SVG-PDA with 75% stenosis of SVG-D1 which was  treated with DESx1. - she denies any recent chest pain or dyspnea on exertion. She completed 1 year of DAPT in 06/2018 but has remained on ASA 81mg  daily and Brilinta 90mg  BID. Will review with Dr. Domenic Polite to see if he wants her to discontinue Brilinta or continue at a lower maintenance dose of 60mg  BID. I did review with the patient's family that Brilinta could now be held for 5 days if she does undergo thyroidectomy in the coming months.    ADDENDUM: Reviewed with Dr. Domenic Polite. Will plan to discontinue Brilinta as there was no indication at the time of cath for her to remain on this for longer than 12 months. Called and reviewed with the patient's granddaughter who manages her medications. Will remove from her medication list.   2. HTN - BP is typically well-controlled but was elevated with SBP in the 160's earlier today but the patient was upset at that time. I encouraged them to keep a BP log and report back if BP remains elevated.  - continue Toprol-XL 25mg  daily.   3. HLD - followed by PCP. No recent FLP available for review in Epic but LFT's were WNL in 05/2018. - continue Atorvastatin 40mg  daily with goal LDL less than 70 given known CAD.   4. COVID-19 Education - The signs and symptoms of  COVID-19 were discussed with the patient and how to seek care for testing. The importance of social distancing was discussed today.  Time:   Today, I have spent 17 minutes with the patient with telehealth technology discussing the above problems.     Medication Adjustments/Labs and Tests Ordered: Current medicines are reviewed at length with the patient today.  Concerns regarding medicines are outlined above.   Tests Ordered: No orders of the defined types were placed in this encounter.   Medication Changes: No orders of the defined types were placed in this encounter.   Disposition:  Follow up with Dr. Domenic Polite in 6 months  Signed, Erma Heritage, PA-C  09/02/2018 7:52 AM    Erin Jordan

## 2018-09-01 NOTE — Patient Instructions (Signed)
Medication Instructions:  Your physician recommends that you continue on your current medications as directed. Please refer to the Current Medication list given to you today.   Labwork: NONE  Testing/Procedures: NONE  Follow-Up: Your physician wants you to follow-up in: 6 Months with Dr. Domenic Polite. You will receive a reminder letter in the mail two months in advance. If you don't receive a letter, please call our office to schedule the follow-up appointment.   Any Other Special Instructions Will Be Listed Below (If Applicable).  Your physician has requested that you regularly monitor and record your blood pressure readings at home for 2 weeks. Please use the same machine at the same time of day to check your readings and record them to call back to office.     If you need a refill on your cardiac medications before your next appointment, please call your pharmacy.  Thank you for choosing [facility]!

## 2018-09-03 ENCOUNTER — Other Ambulatory Visit: Payer: Self-pay | Admitting: Student

## 2018-09-14 DIAGNOSIS — E0590 Thyrotoxicosis, unspecified without thyrotoxic crisis or storm: Secondary | ICD-10-CM | POA: Insufficient documentation

## 2018-09-24 ENCOUNTER — Other Ambulatory Visit (HOSPITAL_COMMUNITY): Payer: Self-pay | Admitting: Nephrology

## 2018-09-24 ENCOUNTER — Other Ambulatory Visit: Payer: Self-pay | Admitting: Nephrology

## 2018-09-24 DIAGNOSIS — N183 Chronic kidney disease, stage 3 unspecified: Secondary | ICD-10-CM

## 2018-10-13 ENCOUNTER — Inpatient Hospital Stay: Admission: RE | Admit: 2018-10-13 | Payer: Medicare HMO | Source: Ambulatory Visit

## 2018-10-15 ENCOUNTER — Ambulatory Visit (HOSPITAL_COMMUNITY)
Admission: RE | Admit: 2018-10-15 | Discharge: 2018-10-15 | Disposition: A | Discharge status: Home / Self Care | Payer: Medicare HMO | Source: Ambulatory Visit | Attending: Nephrology | Admitting: Nephrology

## 2018-10-15 ENCOUNTER — Other Ambulatory Visit: Payer: Self-pay

## 2018-10-15 DIAGNOSIS — N183 Chronic kidney disease, stage 3 unspecified: Secondary | ICD-10-CM

## 2018-10-15 IMAGING — US US RENAL
1 series · 14 of 25 positions shown · non-contrast
Comparison: CT dated [DATE].

CLINICAL DATA: Chronic kidney disease

EXAM:
RENAL / URINARY TRACT ULTRASOUND COMPLETE

[Series 1: us renal · 14 of 51 slices shown]
[im 1/51]
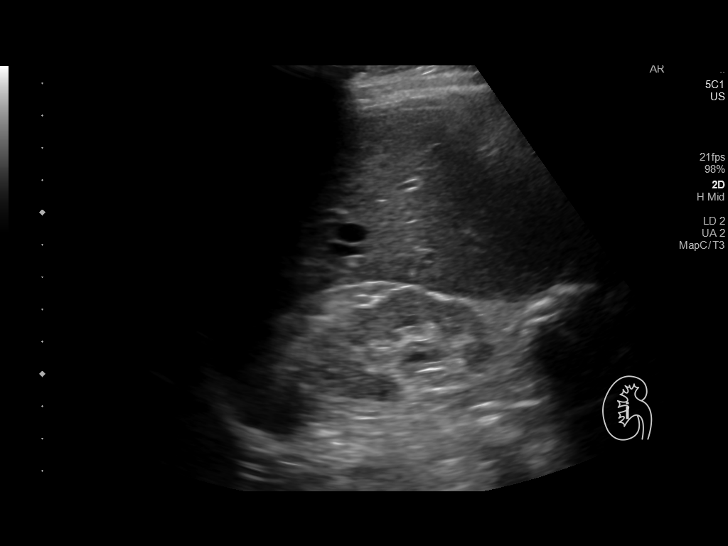
[im 5/51]
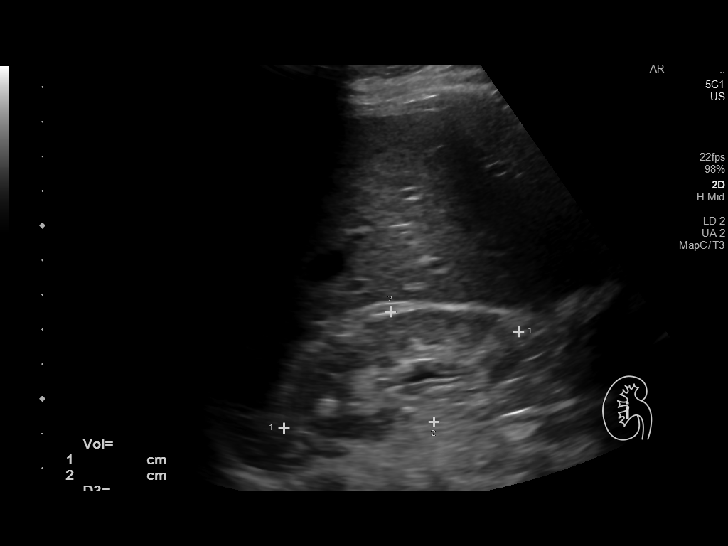
[im 9/51]
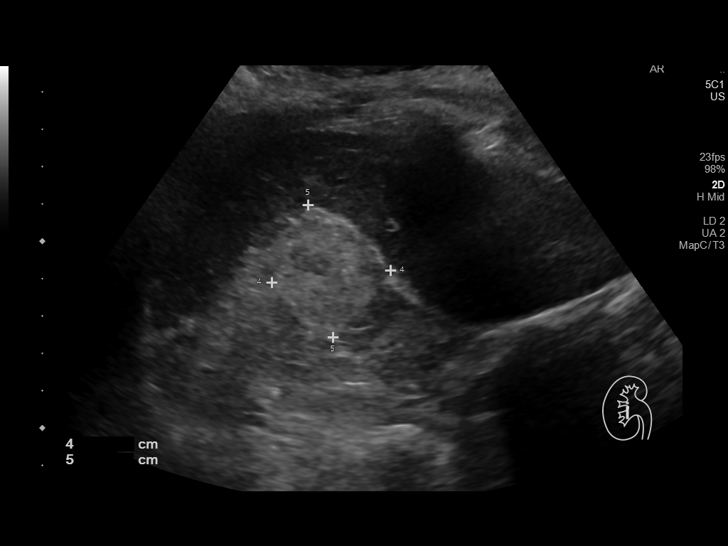
[im 13/51]
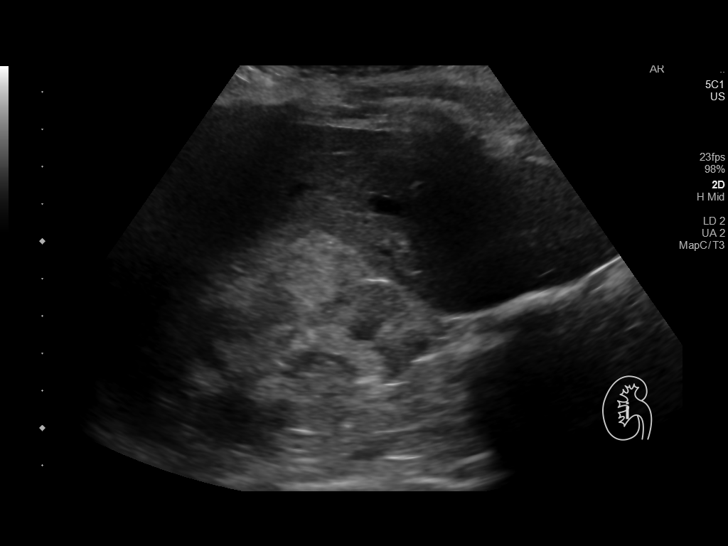
[im 17/51]
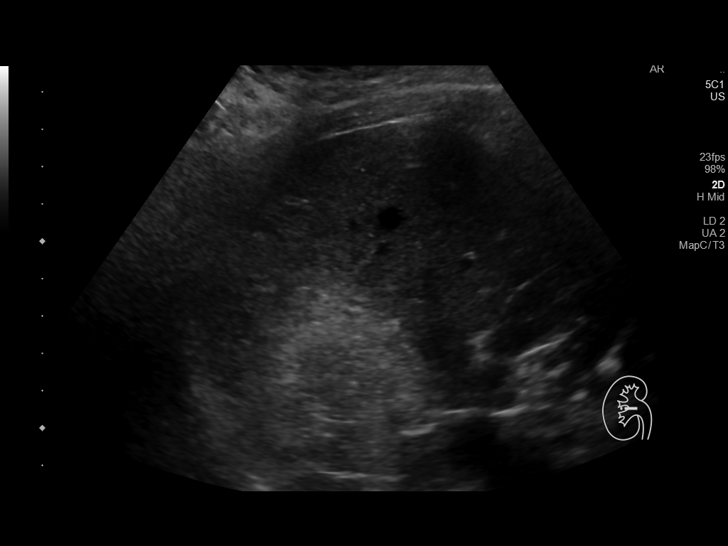
[im 19/51]
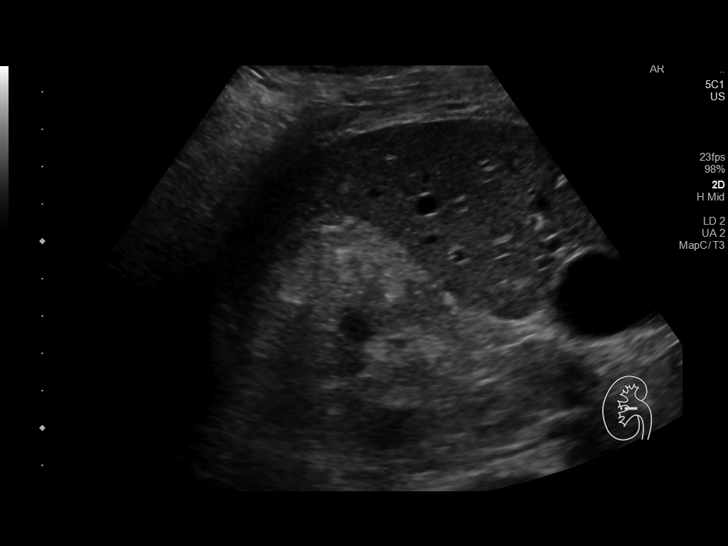
[im 23/51]
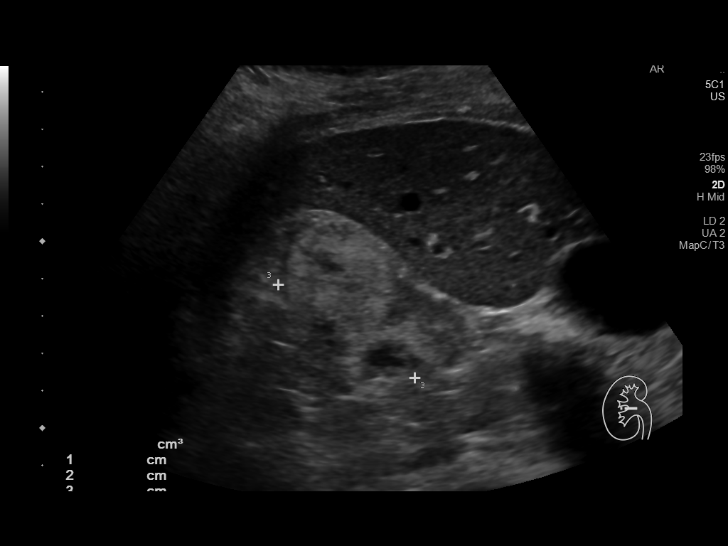
[im 28/51]
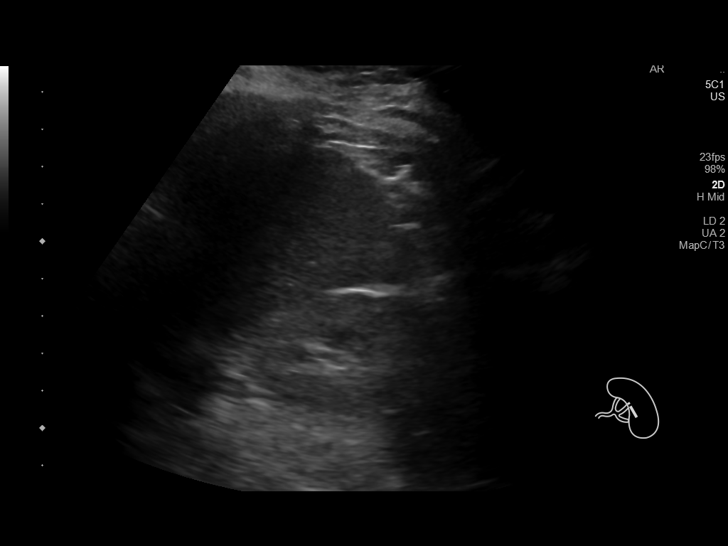
[im 32/51]
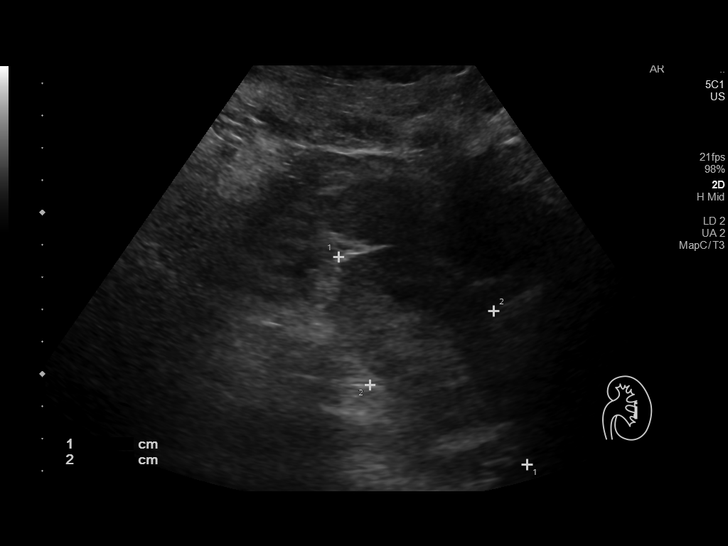
[im 34/51]
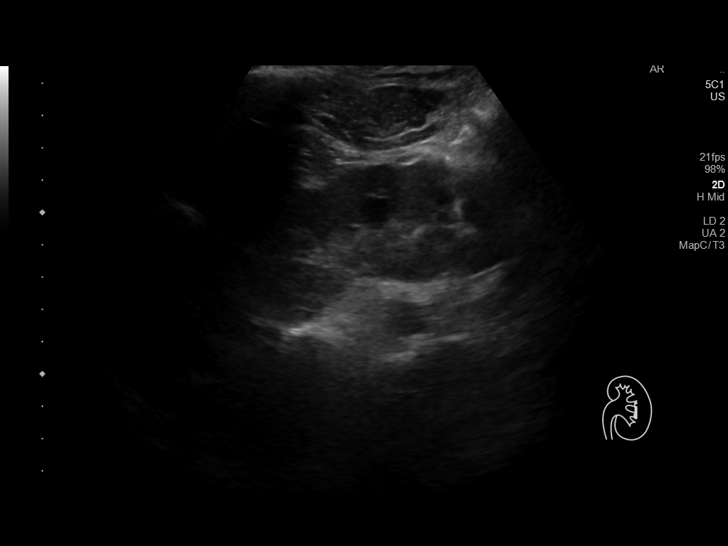
[im 38/51]
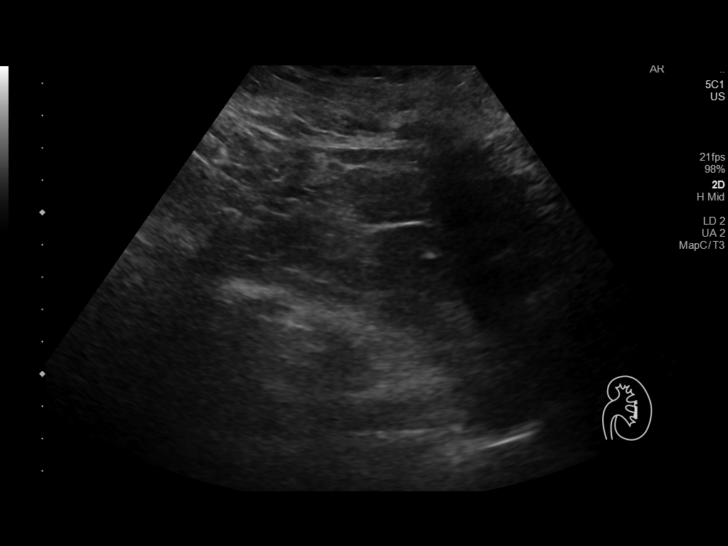
[im 42/51]
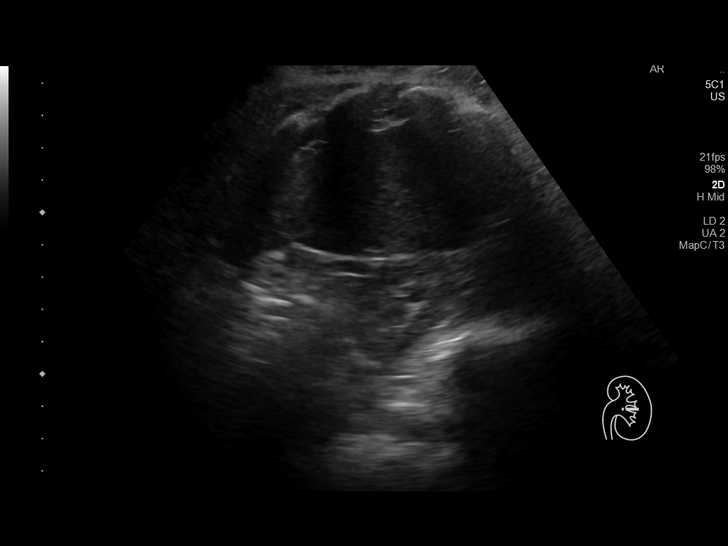
[im 46/51]
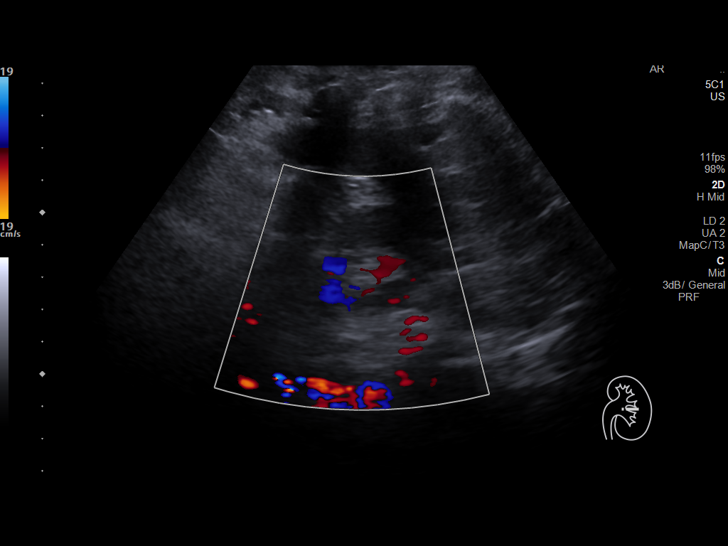
[im 51/51]
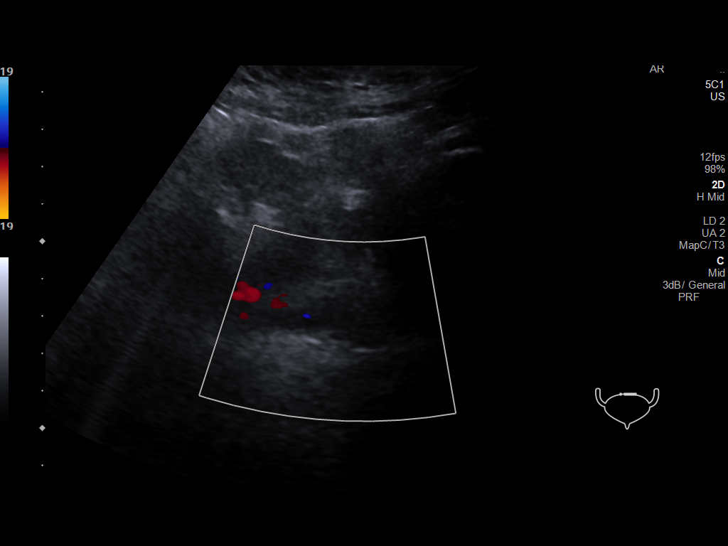

[14 of 25 positions shown; findings below may reference images not displayed]

FINDINGS: Right Kidney:

Renal measurements: 7.3 x 3.4 x 4.4 cm = volume: 57.9 mL. There is a
solid mass in the interpolar region measuring 3.2 x 3.6 x 3.2 cm.
There is no hydronephrosis. No shadowing echogenic kidney stones.

Left Kidney:

Renal measurements: 10.1 x 3.9 x 5 cm = volume: 103 mL. Echogenicity
within normal limits. No mass or hydronephrosis visualized.

Bladder:

Bladder was underdistended which severely limits evaluation.
IMPRESSION: 1. Solid right renal mass measuring approximately 3.6 cm. This is
concerning for renal cell carcinoma until proven otherwise. Urologic
follow-up is recommended.
2. No hydronephrosis.
3. Underdistended urinary bladder which limits evaluation.

## 2018-12-12 ENCOUNTER — Other Ambulatory Visit: Payer: Self-pay

## 2018-12-12 ENCOUNTER — Ambulatory Visit
Admission: RE | Admit: 2018-12-12 | Discharge: 2018-12-12 | Disposition: A | Discharge status: Home / Self Care | Payer: Medicare HMO | Source: Ambulatory Visit | Attending: Family Medicine | Admitting: Family Medicine

## 2018-12-12 ENCOUNTER — Other Ambulatory Visit: Payer: Self-pay | Admitting: Family Medicine

## 2018-12-12 DIAGNOSIS — K753 Granulomatous hepatitis, not elsewhere classified: Secondary | ICD-10-CM

## 2018-12-12 DIAGNOSIS — N2889 Other specified disorders of kidney and ureter: Secondary | ICD-10-CM

## 2018-12-12 IMAGING — MR MR ABDOMEN W/O CM
8 series · 48 of 48 positions shown · non-contrast
Comparison: Ultrasound [DATE] and CT [DATE]

CLINICAL DATA: Evaluate kidney mass.

EXAM:
MRI ABDOMEN WITHOUT CONTRAST
TECHNIQUE: Multiplanar multisequence MR imaging was performed without the
administration of intravenous contrast.

[Series 3: T2 · coronal · 5.0mm · 1.45mm/px · 3 of 36 slices shown (1 of 3)]
[im 1/36]
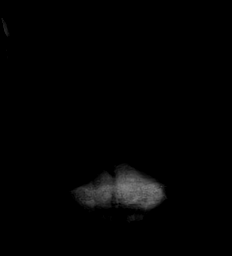
[im 18/36]
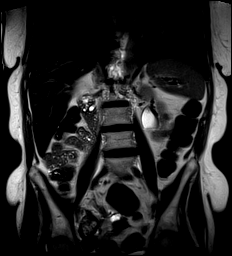
[im 36/36]
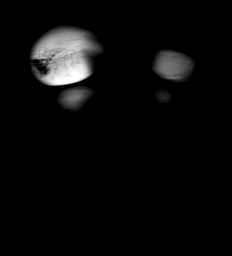

[Series 4: T1 · axial · 3.0mm · 1.12mm/px · z∈[-39,+174]mm · 14 of 144 slices shown]
[im 1/144]
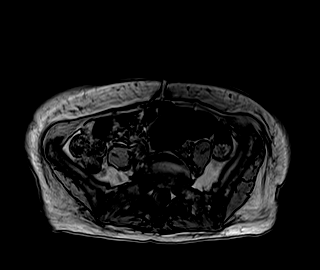
[im 12/144]
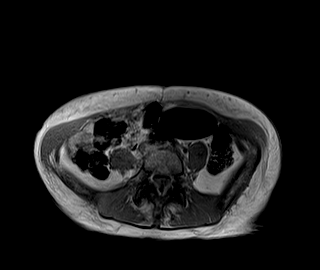
[im 23/144]
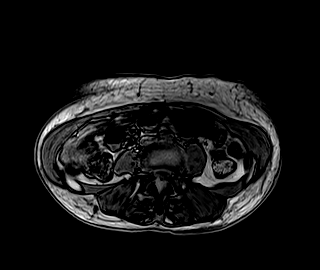
[im 34/144]
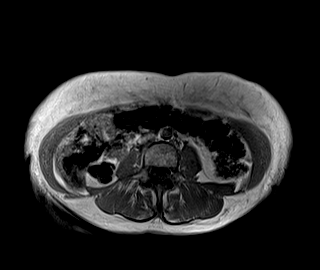
[im 45/144]
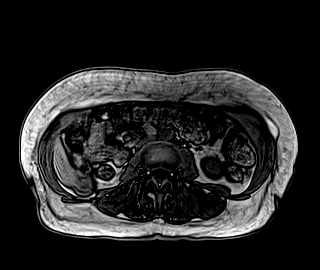
[im 56/144]
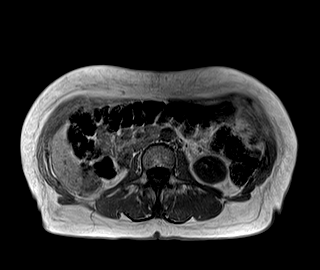
[im 67/144]
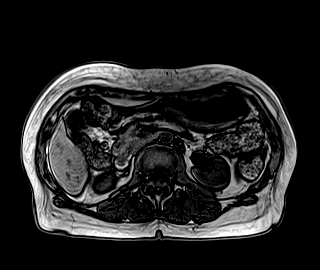
[im 78/144]
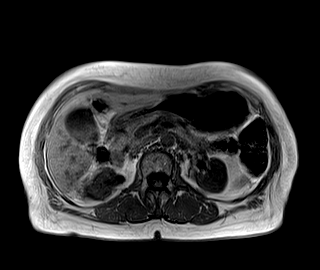
[im 89/144]
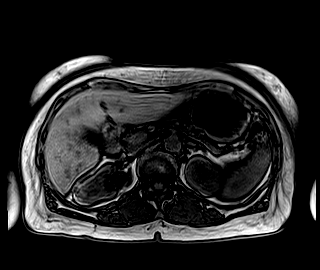
[im 100/144]
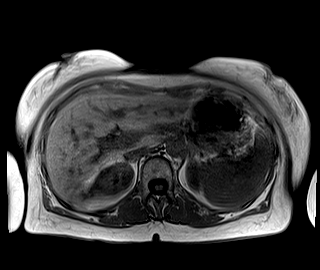
[im 111/144]
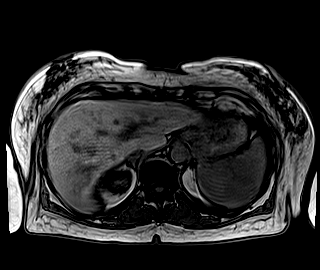
[im 122/144]
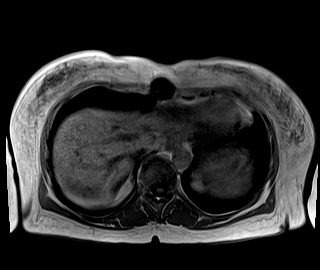
[im 133/144]
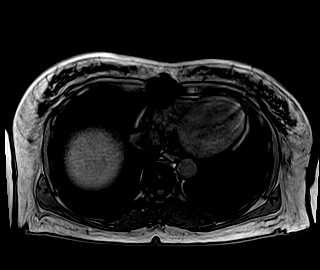
[im 144/144]
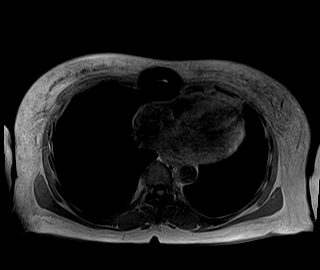

[Series 5: T2 · axial · 6.0mm · 1.12mm/px · z∈[-30,+179]mm · 3 of 30 slices shown (2 of 3)]
[im 1/30]
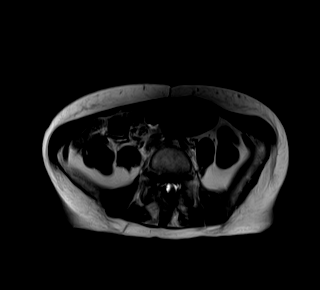
[im 15/30]
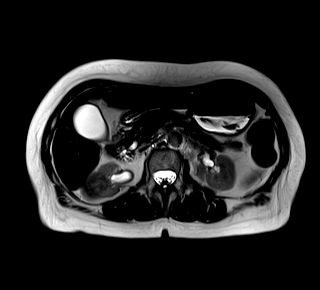
[im 30/30]
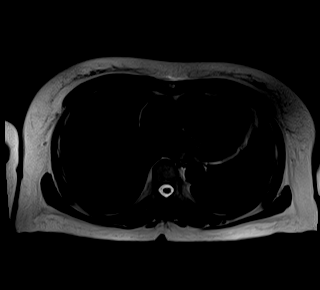

[Series 6: bSSFP · axial · 5.0mm · 1.18mm/px · z∈[-37,+185]mm · 4 of 38 slices shown]
[im 1/38]
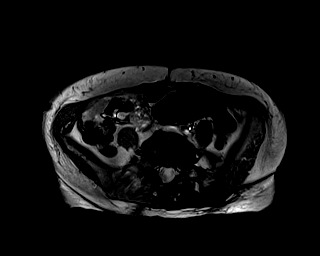
[im 13/38]
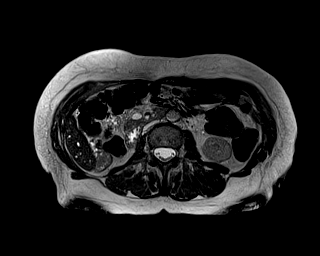
[im 25/38]
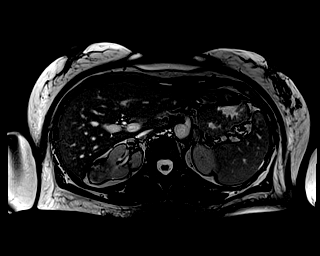
[im 38/38]
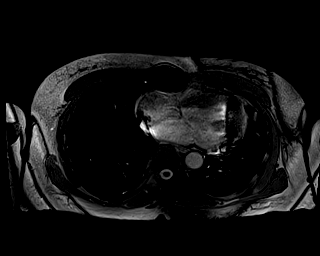

[Series 7: T2 · axial · 5.0mm · 1.33mm/px · z∈[-30,+192]mm · 4 of 38 slices shown (3 of 3)]
[im 1/38]
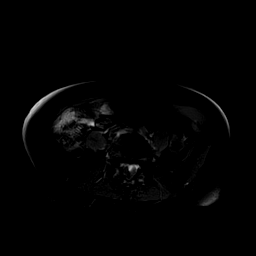
[im 13/38]
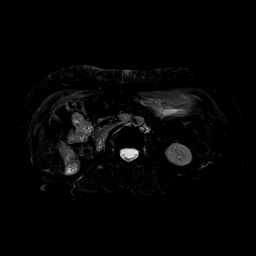
[im 25/38]
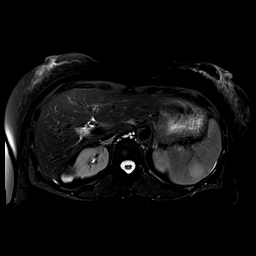
[im 38/38]
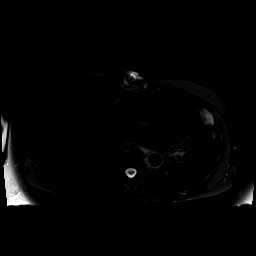

[Series 8: DWI · axial · 5.0mm · 1.42mm/px · z∈[-48,+162]mm · 10 of 108 slices shown (1 of 2)]
[im 1/108]
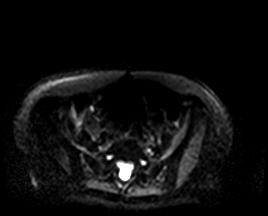
[im 12/108]
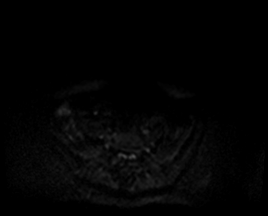
[im 24/108]
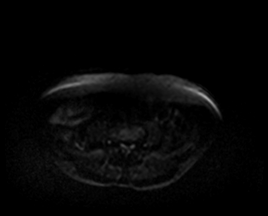
[im 36/108]
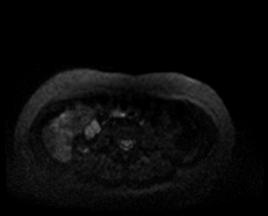
[im 48/108]
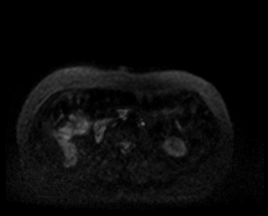
[im 60/108]
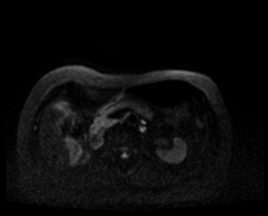
[im 72/108]
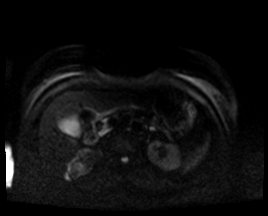
[im 84/108]
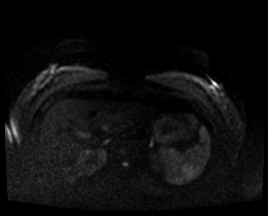
[im 96/108]
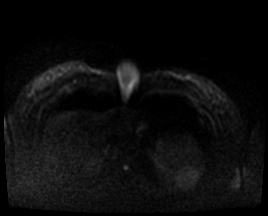
[im 108/108]
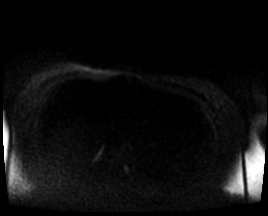

[Series 9: DWI · axial · 5.0mm · 1.42mm/px · z∈[-48,+162]mm · 3 of 36 slices shown (2 of 2)]
[im 1/36]
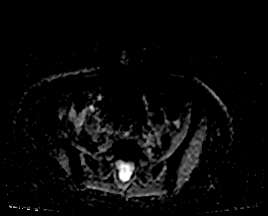
[im 18/36]
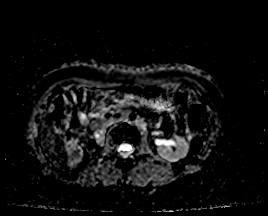
[im 36/36]
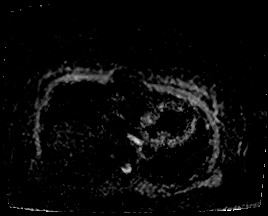

[Series 10: T1 dynamic · axial · non-contrast · 3.0mm · 1.16mm/px · z∈[-31,+182]mm · 7 of 72 slices shown]
[im 1/72]
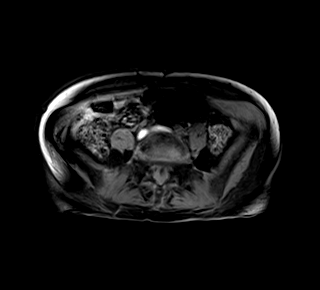
[im 12/72]
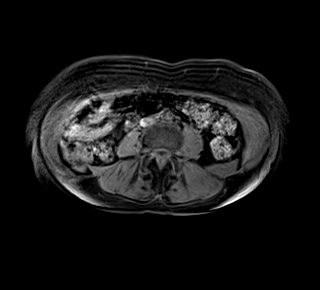
[im 24/72]
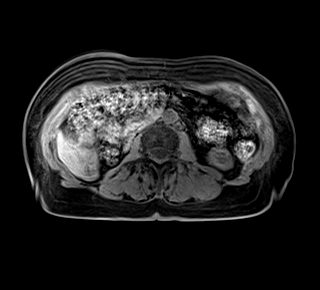
[im 36/72]
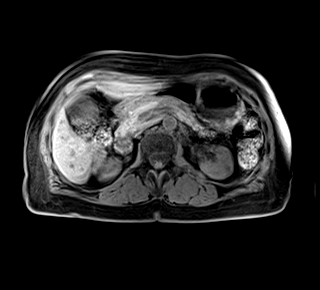
[im 48/72]
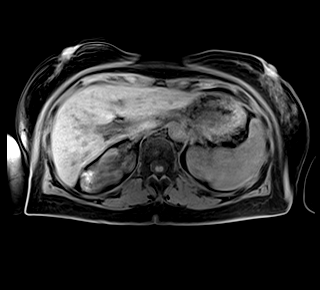
[im 60/72]
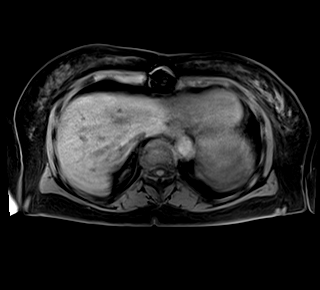
[im 72/72]
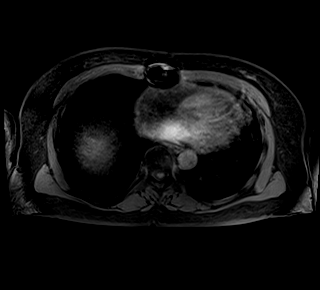

[48 of 48 positions shown; findings below may reference images not displayed]

FINDINGS: Lower chest: No acute findings.

Hepatobiliary: Within the limitations of unenhanced technique there
is no focal liver abnormality. Gallbladder is normal. No gallstones
or gallbladder wall inflammation.

Pancreas: Pancreas divisum anatomy suspected. No main duct
dilatation, inflammation or mass.

Spleen: Normal size spleen. There is a mass within the spleen
measuring 4 cm, image [DATE]. This exhibits mild, increased
heterogeneous T2 signal and is isointense on the T1 weighted
sequences.

Adrenals/Urinary Tract: Normal appearance of the adrenal glands.
Again seen is a solid-appearing, mildly heterogeneous mass arising
from lateral cortex of right kidney measuring 3.8 cm, image [DATE].
Not significantly changed in size from [DATE]. This is
predominantly T2 hypointense with some internal areas of mild
increased T2 signal. On the T1 weighted sequences this is mostly T1
isointense with some areas of increased T1 signal within the lateral
aspect of the lesion reflecting internal hemorrhage. Small cystic
lesion in the lateral cortex of left kidney measures 5 mm.
Similarly, there is an 8 mm exophytic cyst arising from upper pole
of right kidney.

Stomach/Bowel: Visualized portions within the abdomen are
unremarkable.

Vascular/Lymphatic: No pathologically enlarged lymph nodes
identified. No abdominal aortic aneurysm demonstrated.

Other:  No free fluid or fluid collection.

Musculoskeletal: No suspicious bone lesions identified.
IMPRESSION: 1. Persistent solid-appearing mass with some internal areas of
hemorrhage arising from the lateral cortex of the right mid kidney.
Although technically incompletely characterized without IV contrast
this is highly suspicious for renal cell carcinoma.
2. Indeterminate mass within the spleen is identified measuring 4
cm. This is also incompletely characterized without IV contrast.
Statistically however solitary splenic lesions are typically benign.
Recommend follow-up imaging in 6-12 months with repeat MRI to
evaluate for any temporal change in the size of this lesion.
3.

## 2019-01-14 ENCOUNTER — Telehealth: Payer: Self-pay | Admitting: Family Medicine

## 2019-01-14 NOTE — Telephone Encounter (Signed)
Patient aware and verbalizes understanding. 

## 2019-01-14 NOTE — Telephone Encounter (Signed)
I am unable to fill since she is not an established patient here yet. I recommend she discuss with Dr. Dorris Fetch since he is the provider treating her hyperthyroidism. I know it is not ideal but many times patients need to go to urgent care to get refills when they are in-between provider offices.

## 2019-01-22 ENCOUNTER — Telehealth: Payer: Self-pay | Admitting: Family Medicine

## 2019-01-25 ENCOUNTER — Other Ambulatory Visit: Payer: Self-pay

## 2019-01-25 ENCOUNTER — Ambulatory Visit (INDEPENDENT_AMBULATORY_CARE_PROVIDER_SITE_OTHER): Payer: Medicare HMO | Admitting: Family Medicine

## 2019-01-25 ENCOUNTER — Encounter: Payer: Self-pay | Admitting: Family Medicine

## 2019-01-25 VITALS — BP 175/88 | HR 78 | Temp 97.5°F | Ht 61.0 in | Wt 116.0 lb

## 2019-01-25 DIAGNOSIS — E042 Nontoxic multinodular goiter: Secondary | ICD-10-CM | POA: Diagnosis not present

## 2019-01-25 DIAGNOSIS — I10 Essential (primary) hypertension: Secondary | ICD-10-CM

## 2019-01-25 DIAGNOSIS — N1832 Chronic kidney disease, stage 3b: Secondary | ICD-10-CM

## 2019-01-25 DIAGNOSIS — Z8639 Personal history of other endocrine, nutritional and metabolic disease: Secondary | ICD-10-CM

## 2019-01-25 DIAGNOSIS — Z23 Encounter for immunization: Secondary | ICD-10-CM

## 2019-01-25 DIAGNOSIS — N2889 Other specified disorders of kidney and ureter: Secondary | ICD-10-CM | POA: Insufficient documentation

## 2019-01-25 DIAGNOSIS — K921 Melena: Secondary | ICD-10-CM

## 2019-01-25 DIAGNOSIS — R161 Splenomegaly, not elsewhere classified: Secondary | ICD-10-CM | POA: Diagnosis not present

## 2019-01-25 DIAGNOSIS — F0390 Unspecified dementia without behavioral disturbance: Secondary | ICD-10-CM

## 2019-01-25 DIAGNOSIS — G2581 Restless legs syndrome: Secondary | ICD-10-CM | POA: Insufficient documentation

## 2019-01-25 LAB — BAYER DCA HB A1C WAIVED: HB A1C (BAYER DCA - WAIVED): 5.9 % (ref <7.0)

## 2019-01-25 MED ORDER — METHIMAZOLE 5 MG PO TABS: 5.0000 mg | ORAL_TABLET | Freq: Every day | ORAL | 3 refills | Status: DC

## 2019-01-25 MED ORDER — DONEPEZIL HCL 5 MG PO TABS: 5.0000 mg | ORAL_TABLET | Freq: Every day | ORAL | 2 refills | Status: DC

## 2019-01-25 NOTE — Progress Notes (Signed)
New Patient Office Visit  Assessment & Plan:  1. Right renal mass - Ambulatory referral to Oncology  2. Splenic mass - Ambulatory referral to Oncology  3. Multinodular goiter - Ambulatory referral to Oncology - methimazole (TAPAZOLE) 5 MG tablet; Take 1 tablet (5 mg total) by mouth daily.  Dispense: 30 tablet; Refill: 3  4. Blood in stool - Ambulatory referral to Oncology  5. Stage 3b chronic kidney disease - 24 hour urine container and instructions given to patient today. Discussed that the urine needs to be returned and then she can complete the lab work for the nephrologist.   6. Dementia without behavioral disturbance, unspecified dementia type ([place]) - donepezil (ARICEPT) 5 MG tablet; Take 1 tablet (5 mg total) by mouth at bedtime.  Dispense: 30 tablet; Refill: 2  7. Essential hypertension - High today. Asked that they keep a log of BP readings and bring it with them to the next appointment. Goal < 150/90.   8. History of diabetes mellitus - Bayer DCA Hb A1c Waived  9. Need for immunization against influenza - Flu Vaccine QUAD High Dose(Fluad)   Follow-up: Return in about 4 weeks (around 02/22/2019) for HTN, dementia, thyroid.   Hendricks Limes, MSN, APRN, FNP-C [facility]  Subjective:  Patient ID: Erin Jordan, female    DOB: 08-08-1945  Age: 73 y.o. MRN: [id number]  Patient Care Team: [name], FNP as PCP - General (Family Medicine) Satira Sark, MD as PCP - Cardiology (Cardiology)  CC:  Chief Complaint  Patient presents with  . New Patient (Initial Visit)    HPI Erin Jordan presents to establish care. Patient is transferring care from [place] at [facility].  Patient was seen at [facility] on 04/24/2018 at which point she was found to have elevated LFTs, elevated lipase, and worsening kidney function. An abdominal CT was completed which revealed a small focus of calcified granuloma in the right lower lobe of  the liver and a solid-appearing mass of the right kidney. A MRI with and without contrast was recommended for further characterization. On 05/15/2018 she was referred to nephrology - GFR of 36, creatinine of 1.62. Patient had a telephone visit with nephrology on 09/14/2018 at which point labs and a renal ultrasound were ordered. The ultrasound on 10/15/2018 revealed a solid right renal mass ~3.6 cm that is concerning for renal cell carcinoma until proven otherwise. She then had an abdominal MRI on 12/12/2018 which revealed:  1. Persistent solid-appearing mass with some internal areas of hemorrhage arising from the lateral cortex of the right mid kidney. Although technically incompletely characterized without IV contrast this is highly suspicious for renal cell carcinoma. 2. Indeterminate mass within the spleen is identified measuring 4 cm. This is also incompletely characterized without IV contrast. Statistically however solitary splenic lesions are typically benign. Recommend follow-up imaging in 6-12 months with repeat MRI to evaluate for any temporal change in the size of this lesion. The labs have not yet been completed. The patient's grand-daughter states every time she goes to [facility] she is unable to get anyone to come to the window or door.   Patient was supposed to have a total thyroidectomy due to a large multinodular goiter that was postponed due to COVID-19. Dr. Armandina Gemma at [facility] was to further evaluate and treat per Dr. Aviva Signs. She has completed Brilinta. She is currently out of the methimazole 5 mg that was prescribed by endocrinology. The granddaughter  reports the surgery is now being postponed as they were told the patient's kidney issues need to be addressed first.   The grand-daughter reports her sister told her the patient had some blood in the stool recently. Patient denies constipation or straining. She does report she remembers the blood and that it was bright red.    She also has dementia. While this could be due to her thyroid, it is worsening. While in the room she kept telling me stories of the ghosts that come into her room and mess with her on a daily basis.   Average systolic BP at home 025-852D. Patient is very upset today due to the ghosts and discussion of MRI results.    Review of Systems  Constitutional: Positive for weight loss. Negative for chills, fever and malaise/fatigue.  HENT: Negative for congestion, ear discharge, ear pain, nosebleeds, sinus pain, sore throat and tinnitus.   Eyes: Negative for blurred vision, double vision, pain, discharge and redness.  Respiratory: Negative for cough, shortness of breath and wheezing.   Cardiovascular: Negative for chest pain, palpitations and leg swelling.  Gastrointestinal: Positive for blood in stool. Negative for abdominal pain, constipation, diarrhea, heartburn, nausea and vomiting.  Genitourinary: Negative for dysuria, frequency and urgency.  Musculoskeletal: Negative for myalgias.  Skin: Negative for rash.  Neurological: Negative for dizziness, seizures, weakness and headaches.  Psychiatric/Behavioral: Positive for memory loss. Negative for depression, substance abuse and suicidal ideas. The patient is not nervous/anxious.      Current Outpatient Medications:  .  aspirin EC 81 MG tablet, Take 1 tablet (81 mg total) by mouth daily., Disp: 90 tablet, Rfl: 3 .  atorvastatin (LIPITOR) 40 MG tablet, Take 1 tablet (40 mg total) by mouth daily., Disp: 90 tablet, Rfl: 3 .  calcium carbonate (OSCAL) 1500 (600 Ca) MG TABS tablet, Take by mouth., Disp: , Rfl:  .  cholecalciferol (VITAMIN D) 25 MCG (1000 UT) tablet, Take by mouth., Disp: , Rfl:  .  methimazole (TAPAZOLE) 5 MG tablet, Take 1 tablet (5 mg total) by mouth daily., Disp: 30 tablet, Rfl: 3 .  metoprolol succinate (TOPROL XL) 25 MG 24 hr tablet, Take 1 tablet (25 mg total) by mouth daily., Disp: 90 tablet, Rfl: 3 .  nitroGLYCERIN  (NITROSTAT) 0.4 MG SL tablet, Place 1 tablet (0.4 mg total) under the tongue every 5 (five) minutes x 3 doses as needed for chest pain., Disp: 25 tablet, Rfl: 3 .  donepezil (ARICEPT) 5 MG tablet, Take 1 tablet (5 mg total) by mouth at bedtime., Disp: 30 tablet, Rfl: 2  No Known Allergies  Past Medical History:  Diagnosis Date  . Arthritis   . Chronic kidney disease, stage 3, mod decreased GFR   . Coronary atherosclerosis of native coronary artery 2006   Multivessel status post CABG in [place], graft disease documented March 2019 with DES to SVG to diagonal  . Diabetes mellitus, type 2 ([place])   . Essential hypertension, benign   . 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 Hyperlipidemia   . NSTEMI (non-ST elevated myocardial infarction) ([place]) 06/24/2017    Past Surgical History:  Procedure Laterality Date  . ABDOMINAL HYSTERECTOMY    . CORONARY ARTERY BYPASS GRAFT  2006   [place], [place]  . CORONARY STENT INTERVENTION N/A 06/25/2017   Procedure: CORONARY STENT INTERVENTION;  Surgeon: Jettie Booze, MD;  Location: [facility];  Service: Cardiovascular;  Laterality: N/A;  . LEFT HEART CATH AND CORS/GRAFTS ANGIOGRAPHY N/A 06/25/2017  Procedure: LEFT HEART CATH AND CORS/GRAFTS ANGIOGRAPHY;  Surgeon: Jettie Booze, MD;  Location: [facility];  Service: Cardiovascular;  Laterality: N/A;    Family History  Problem Relation Age of Onset  . Heart attack Mother   . Hypertension Mother     Social History   Socioeconomic History  . Marital status: Widowed    Spouse name: Not on file  . Number of children: Not on file  . Years of education: Not on file  . Highest education level: Not on file  Occupational History  . Not on file  Social Needs  . Financial resource strain: Not on file  . Food insecurity    Worry: Not on file    Inability: Not on file  . Transportation needs    Medical: Not on file    Non-medical: Not on file  Tobacco Use  .  Smoking status: Never Smoker  . Smokeless tobacco: Never Used  Substance and Sexual Activity  . Alcohol use: No  . Drug use: No  . Sexual activity: Never  Lifestyle  . Physical activity    Days per week: Not on file    Minutes per session: Not on file  . Stress: Not on file  Relationships  . Social Herbalist on phone: Not on file    Gets together: Not on file    Attends religious service: Not on file    Active member of club or organization: Not on file    Attends meetings of clubs or organizations: Not on file    Relationship status: Not on file  . Intimate partner violence    Fear of current or ex partner: Not on file    Emotionally abused: Not on file    Physically abused: Not on file    Forced sexual activity: Not on file  Other Topics Concern  . Not on file  Social History Narrative   Lives in [place] with her daughter and grandchildren.    Objective:   Today's Vitals: BP (!) 175/88   Pulse 78   Temp (!) 97.5 F (36.4 C) (Temporal)   Ht 5\' 1"  (1.549 m)   Wt 116 lb (52.6 kg)   SpO2 100%   BMI 21.92 kg/m   Physical Exam Vitals signs reviewed.  Constitutional:      General: She is not in acute distress.    Appearance: Normal appearance. She is normal weight. She is not ill-appearing, toxic-appearing or diaphoretic.  HENT:     Head: Normocephalic and atraumatic.  Eyes:     General: No scleral icterus.       Right eye: No discharge.        Left eye: No discharge.     Conjunctiva/sclera: Conjunctivae normal.  Neck:     Musculoskeletal: Normal range of motion.  Cardiovascular:     Rate and Rhythm: Normal rate and regular rhythm.     Heart sounds: Normal heart sounds. No murmur. No friction rub. No gallop.   Pulmonary:     Effort: Pulmonary effort is normal. No respiratory distress.     Breath sounds: Normal breath sounds. No stridor. No wheezing, rhonchi or rales.  Musculoskeletal: Normal range of motion.  Skin:    General: Skin is warm and  dry.     Capillary Refill: Capillary refill takes less than 2 seconds.  Neurological:     General: No focal deficit present.     Mental Status: She is alert and  oriented to person, place, and time. Mental status is at baseline.  Psychiatric:        Attention and Perception: Attention and perception normal.        Mood and Affect: Mood normal.        Speech: Speech normal.        Behavior: Behavior normal.        Thought Content: Thought content normal.        Cognition and Memory: Cognition is impaired. Memory is impaired.        Judgment: Judgment normal.

## 2019-01-25 NOTE — Patient Instructions (Signed)
[facility] in [place]  Address: [address] [place], [place] [address] Phone: [phone]  Address: [address] [place], [place] [address] Phone: [phone]

## 2019-01-27 LAB — MICROALBUMIN / CREATININE URINE RATIO

## 2019-01-27 LAB — HM HEPATITIS C SCREENING LAB: HM Hepatitis Screen: NEGATIVE

## 2019-01-28 ENCOUNTER — Encounter (HOSPITAL_COMMUNITY): Payer: Self-pay | Admitting: Surgery

## 2019-01-28 ENCOUNTER — Other Ambulatory Visit: Payer: Self-pay

## 2019-01-29 ENCOUNTER — Inpatient Hospital Stay (HOSPITAL_COMMUNITY): Payer: Medicare HMO | Attending: Hematology | Admitting: Hematology

## 2019-01-29 ENCOUNTER — Inpatient Hospital Stay (HOSPITAL_COMMUNITY): Payer: Medicare HMO

## 2019-01-29 ENCOUNTER — Encounter (HOSPITAL_COMMUNITY): Payer: Self-pay | Admitting: Hematology

## 2019-01-29 VITALS — BP 174/81 | HR 86 | Temp 97.9°F | Resp 20 | Wt 120.3 lb

## 2019-01-29 DIAGNOSIS — R161 Splenomegaly, not elsewhere classified: Secondary | ICD-10-CM | POA: Insufficient documentation

## 2019-01-29 DIAGNOSIS — N183 Chronic kidney disease, stage 3 unspecified: Secondary | ICD-10-CM | POA: Diagnosis not present

## 2019-01-29 DIAGNOSIS — E1122 Type 2 diabetes mellitus with diabetic chronic kidney disease: Secondary | ICD-10-CM | POA: Insufficient documentation

## 2019-01-29 DIAGNOSIS — N2889 Other specified disorders of kidney and ureter: Secondary | ICD-10-CM | POA: Diagnosis not present

## 2019-01-29 DIAGNOSIS — Z801 Family history of malignant neoplasm of trachea, bronchus and lung: Secondary | ICD-10-CM | POA: Diagnosis not present

## 2019-01-29 DIAGNOSIS — I129 Hypertensive chronic kidney disease with stage 1 through stage 4 chronic kidney disease, or unspecified chronic kidney disease: Secondary | ICD-10-CM | POA: Diagnosis not present

## 2019-01-29 DIAGNOSIS — Z809 Family history of malignant neoplasm, unspecified: Secondary | ICD-10-CM | POA: Insufficient documentation

## 2019-01-29 LAB — CBC WITH DIFFERENTIAL/PLATELET
Abs Immature Granulocytes: 0.01 10*3/uL (ref 0.00–0.07)
Basophils Absolute: 0 10*3/uL (ref 0.0–0.1)
Basophils Relative: 1 %
Eosinophils Absolute: 0.2 10*3/uL (ref 0.0–0.5)
Eosinophils Relative: 3 %
HCT: 40.6 % (ref 36.0–46.0)
Hemoglobin: 13.3 g/dL (ref 12.0–15.0)
Immature Granulocytes: 0 %
Lymphocytes Relative: 26 %
Lymphs Abs: 1.5 10*3/uL (ref 0.7–4.0)
MCH: 30.9 pg (ref 26.0–34.0)
MCHC: 32.8 g/dL (ref 30.0–36.0)
MCV: 94.2 fL (ref 80.0–100.0)
Monocytes Absolute: 0.5 10*3/uL (ref 0.1–1.0)
Monocytes Relative: 8 %
Neutro Abs: 3.5 10*3/uL (ref 1.7–7.7)
Neutrophils Relative %: 62 %
Platelets: 198 10*3/uL (ref 150–400)
RBC: 4.31 MIL/uL (ref 3.87–5.11)
RDW: 12.6 % (ref 11.5–15.5)
WBC: 5.7 10*3/uL (ref 4.0–10.5)
nRBC: 0 % (ref 0.0–0.2)

## 2019-01-29 LAB — COMPREHENSIVE METABOLIC PANEL
ALT: 16 U/L (ref 0–44)
AST: 17 U/L (ref 15–41)
Albumin: 4.2 g/dL (ref 3.5–5.0)
Alkaline Phosphatase: 85 U/L (ref 38–126)
Anion gap: 8 (ref 5–15)
BUN: 38 mg/dL — ABNORMAL HIGH (ref 8–23)
CO2: 27 mmol/L (ref 22–32)
Calcium: 9.6 mg/dL (ref 8.9–10.3)
Chloride: 107 mmol/L (ref 98–111)
Creatinine, Ser: 1.61 mg/dL — ABNORMAL HIGH (ref 0.44–1.00)
GFR calc Af Amer: 36 mL/min — ABNORMAL LOW (ref >60)
GFR calc non Af Amer: 31 mL/min — ABNORMAL LOW (ref >60)
Glucose, Bld: 125 mg/dL — ABNORMAL HIGH (ref 70–99)
Potassium: 4.1 mmol/L (ref 3.5–5.1)
Sodium: 142 mmol/L (ref 135–145)
Total Bilirubin: 0.6 mg/dL (ref 0.3–1.2)
Total Protein: 7.1 g/dL (ref 6.5–8.1)

## 2019-01-29 LAB — LACTATE DEHYDROGENASE: LDH: 184 U/L (ref 98–192)

## 2019-01-29 NOTE — Progress Notes (Signed)
[facility] NOTE  Patient Care Team: [name], FNP as PCP - General (Family Medicine) Satira Sark, MD as PCP - Cardiology (Cardiology)  CHIEF COMPLAINTS/PURPOSE OF CONSULTATION:  Right kidney mass and splenic mass.  HISTORY OF PRESENTING ILLNESS:  Erin Jordan 73 y.o. female is seen in consultation today for further work-up and management of right kidney mass.  This patient has chronic kidney disease and follows up with Dr. Lowanda Foster.  CT scan in January 2020 showed suspicious mass in the right kidney.  As her recent renal function gotten worse, an ultrasound was done in July 2020 showing a solid right kidney mass measuring 3.6 cm.  As she cannot receive IV contrast, an MRI without contrast was done.  There showed mass in the right kidney highly suspicious for renal cell cancer.  There is also an indeterminate spleen mass measuring 4 cm.  No other lymphadenopathy was seen.  She denies any fevers, night sweats or weight loss in the last 6 months.  She does live with her daughter at home.  She has dementia.  She is accompanied by her granddaughter today.  Denies any hematuria although has bleeding per rectum occasionally.  No new onset pains reported.  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  She is a never smoker.  Family history significant for brother having lung cancer.  Mother also had some type of cancer, type unknown to the patient.  She does all her ADLs and some IADLs.  Appetite and energy levels are reported as 100%.  MEDICAL HISTORY:  Past Medical History:  Diagnosis Date  . Arthritis   . Chronic kidney disease, stage 3, mod decreased GFR   . Coronary atherosclerosis of native coronary artery 2006   Multivessel status post CABG in [place], graft disease documented March 2019 with DES to SVG to diagonal  . Diabetes mellitus, type 2 ([place])   . Essential  hypertension, benign   . 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 Hyperlipidemia   . NSTEMI (non-ST elevated myocardial infarction) ([place]) 06/24/2017    SURGICAL HISTORY: Past Surgical History:  Procedure Laterality Date  . ABDOMINAL HYSTERECTOMY    . CORONARY ARTERY BYPASS GRAFT  2006   [place], [place]  . CORONARY STENT INTERVENTION N/A 06/25/2017   Procedure: CORONARY STENT INTERVENTION;  Surgeon: Jettie Booze, MD;  Location: [facility];  Service: Cardiovascular;  Laterality: N/A;  . LEFT HEART CATH AND CORS/GRAFTS ANGIOGRAPHY N/A 06/25/2017   Procedure: LEFT HEART CATH AND CORS/GRAFTS ANGIOGRAPHY;  Surgeon: Jettie Booze, MD;  Location: [facility];  Service: Cardiovascular;  Laterality: N/A;    SOCIAL HISTORY: Social History   Socioeconomic History  . Marital status: Widowed    Spouse name: Not on file  . Number of children: 6  . Years of education: Not on file  . Highest education level: Not on file  Occupational History  . Occupation: retired  Scientific laboratory technician  . Financial resource strain: Somewhat hard  . Food insecurity    Worry: Never true    Inability: Never true  . Transportation needs    Medical: No    Non-medical: No  Tobacco Use  . Smoking status: Never Smoker  . Smokeless tobacco: Never Used  Substance and Sexual Activity  . Alcohol use: No  . Drug use: No  . Sexual activity: Never  Lifestyle  .  Physical activity    Days per week: 0 days    Minutes per session: 0 min  . Stress: Only a little  Relationships  . Social Herbalist on phone: Three times a week    Gets together: Three times a week    Attends religious service: 1 to 4 times per year    Active member of club or organization: No    Attends meetings of clubs or organizations: Never    Relationship status: Widowed  . Intimate partner violence    Fear of current or ex partner: No    Emotionally abused: No    Physically abused: No    Forced sexual  activity: No  Other Topics Concern  . Not on file  Social History Narrative   Lives in [place] with her daughter and grandchildren.    FAMILY HISTORY: Family History  Problem Relation Age of Onset  . Heart attack Mother   . Hypertension Mother   . Seizures Son   . Seizures Son   . Seizures Daughter     ALLERGIES:  has No Known Allergies.  MEDICATIONS:  Current Outpatient Medications  Medication Sig Dispense Refill  . aspirin EC 81 MG tablet Take 1 tablet (81 mg total) by mouth daily. 90 tablet 3  . atorvastatin (LIPITOR) 40 MG tablet Take 1 tablet (40 mg total) by mouth daily. 90 tablet 3  . calcium carbonate (OSCAL) 1500 (600 Ca) MG TABS tablet Take 1,500 mg by mouth daily with breakfast.     . cholecalciferol (VITAMIN D) 25 MCG (1000 UT) tablet Take 1,000 Units by mouth daily.     Marland Kitchen donepezil (ARICEPT) 5 MG tablet Take 1 tablet (5 mg total) by mouth at bedtime. 30 tablet 2  . methimazole (TAPAZOLE) 5 MG tablet Take 1 tablet (5 mg total) by mouth daily. 30 tablet 3  . metoprolol succinate (TOPROL XL) 25 MG 24 hr tablet Take 1 tablet (25 mg total) by mouth daily. 90 tablet 3  . nitroGLYCERIN (NITROSTAT) 0.4 MG SL tablet Place 1 tablet (0.4 mg total) under the tongue every 5 (five) minutes x 3 doses as needed for chest pain. (Patient not taking: Reported on 01/29/2019) 25 tablet 3   No current facility-administered medications for this visit.     REVIEW OF SYSTEMS:   Constitutional: Denies fevers, chills or abnormal night sweats Eyes: Denies blurriness of vision, double vision or watery eyes Ears, nose, mouth, throat, and face: Denies mucositis or sore throat Respiratory: Denies cough, dyspnea or wheezes Cardiovascular: Denies palpitation, chest discomfort or lower extremity swelling Gastrointestinal:  Denies nausea, heartburn or change in bowel habits Skin: Denies abnormal skin rashes Lymphatics: Denies new lymphadenopathy or easy bruising Neurological:Denies numbness,  tingling or new weaknesses Behavioral/Psych: Mood is stable, no new changes  All other systems were reviewed with the patient and are negative.  PHYSICAL EXAMINATION: ECOG PERFORMANCE STATUS: 1 - Symptomatic but completely ambulatory  Vitals:   01/29/19 0823  BP: (!) 174/81  Pulse: 86  Resp: 20  Temp: 97.9 F (36.6 C)  SpO2: 100%   Filed Weights   01/29/19 0823  Weight: 120 lb 4.8 oz (54.6 kg)    GENERAL:alert, no distress and comfortable SKIN: skin color, texture, turgor are normal, no rashes or significant lesions EYES: normal, conjunctiva are pink and non-injected, sclera clear OROPHARYNX:no exudate, no erythema and lips, buccal mucosa, and tongue normal  NECK: supple, thyroid normal size, non-tender, without nodularity LYMPH:  no palpable lymphadenopathy  in the cervical, axillary or inguinal LUNGS: clear to auscultation and percussion with normal breathing effort HEART: regular rate & rhythm and no murmurs and no lower extremity edema ABDOMEN:abdomen soft, non-tender and normal bowel sounds Musculoskeletal:no cyanosis of digits and no clubbing  PSYCH: alert & oriented x 3 with fluent speech NEURO: no focal motor/sensory deficits  LABORATORY DATA:  I have reviewed the data as listed Lab Results  Component Value Date   WBC 5.7 01/29/2019   HGB 13.3 01/29/2019   HCT 40.6 01/29/2019   MCV 94.2 01/29/2019   PLT 198 01/29/2019     Chemistry      Component Value Date/Time   NA 142 01/29/2019 0941   K 4.1 01/29/2019 0941   CL 107 01/29/2019 0941   CO2 27 01/29/2019 0941   BUN 38 (H) 01/29/2019 0941   CREATININE 1.61 (H) 01/29/2019 0941      Component Value Date/Time   CALCIUM 9.6 01/29/2019 0941   ALKPHOS 85 01/29/2019 0941   AST 17 01/29/2019 0941   ALT 16 01/29/2019 0941   BILITOT 0.6 01/29/2019 0941       RADIOGRAPHIC STUDIES: I have personally reviewed the radiological images as listed and agreed with the findings in the report.  ASSESSMENT & PLAN:   Right renal mass 1.  Right kidney mass: -CT scan on 04/24/2018 showed suspicious mass in the right kidney. -As her renal function gotten worse recently, an ultrasound in July 2020 showed solid right kidney mass measuring 3.6 cm. -MRI of the abdomen on 12/12/2018 without contrast was suspicious for renal cell cancer, although limited by lack of contrast.  MRI also showed indeterminate mass in the spleen, measuring 4 cm. -She denies any recent weight loss.  No hematuria reported. -She is accompanied by her granddaughter today.  She does have dementia. -She does report occasional bleeding per rectum. -I have recommended doing  PET CT scan for further evaluation.  She cannot receive IV contrast due to renal insufficiency. -We will also check a CBC and LDH level today.  We will see her back after the PET scan.  2.  Family history: -Mother reportedly had cancer, type unknown to the patient. -Brother had lung cancer.  Orders Placed This Encounter  Procedures  . NM PET Image Initial (PI) Skull Base To Thigh    Standing Status:   Future    Standing Expiration Date:   01/29/2020    Order Specific Question:   ** REASON FOR EXAM (FREE TEXT)    Answer:   kidney or spleen mass    Order Specific Question:   If indicated for the ordered procedure, I authorize the administration of a radiopharmaceutical per Radiology protocol    Answer:   Yes    Order Specific Question:   Preferred imaging location?    Answer:   [facility]    Order Specific Question:   Radiology Contrast Protocol - do NOT remove file path    Answer:   [email or web address]  . Lactate dehydrogenase    Standing Status:   Future    Number of Occurrences:   1    Standing Expiration Date:   01/29/2020  . CBC with Differential/Platelet    Standing Status:   Future    Number of Occurrences:   1    Standing Expiration Date:   01/29/2020  . Comprehensive metabolic panel    Standing Status:   Future    Number of  Occurrences:   1    Standing  Expiration Date:   01/29/2020   Total time spent is 60 minutes with more than 35 minutes spent face-to-face discussing scan results, further work-up, counseling and coordination of care. All questions were answered. The patient knows to call the clinic with any problems, questions or concerns.     Derek Jack, MD 01/30/2019 4:27 PM

## 2019-01-29 NOTE — Patient Instructions (Signed)
[facility] at [facility] Discharge Instructions  You were seen today by Dr. Delton Coombes. He went over your history, family history and how you've been feeling lately. He will schedule you for a PET scan as well as blood work. He will see you back after your scan for follow up.   Thank you for choosing [facility] at [facility] to provide your oncology and hematology care.  To afford each patient quality time with our provider, please arrive at least 15 minutes before your scheduled appointment time.   If you have a lab appointment with the [facility] please come in thru the  Main Entrance and check in at the main information desk  You need to re-schedule your appointment should you arrive 10 or more minutes late.  We strive to give you quality time with our providers, and arriving late affects you and other patients whose appointments are after yours.  Also, if you no show three or more times for appointments you may be dismissed from the clinic at the providers discretion.     Again, thank you for choosing [facility].  Our hope is that these requests will decrease the amount of time that you wait before being seen by our physicians.       _____________________________________________________________  Should you have questions after your visit to [facility], please contact our office at [phone] between the hours of 8:00 a.m. and 4:30 p.m.  Voicemails left after 4:00 p.m. will not be returned until the following business day.  For prescription refill requests, have your pharmacy contact our office and allow 72 hours.    Cancer Center Support Programs:   > Cancer Support Group  2nd Tuesday of the month 1pm-2pm, Journey Room

## 2019-01-30 NOTE — Assessment & Plan Note (Signed)
1.  Right kidney mass: -CT scan on 04/24/2018 showed suspicious mass in the right kidney. -As her renal function gotten worse recently, an ultrasound in July 2020 showed solid right kidney mass measuring 3.6 cm. -MRI of the abdomen on 12/12/2018 without contrast was suspicious for renal cell cancer, although limited by lack of contrast.  MRI also showed indeterminate mass in the spleen, measuring 4 cm. -She denies any recent weight loss.  No hematuria reported. -She is accompanied by her granddaughter today.  She does have dementia. -She does report occasional bleeding per rectum. -I have recommended doing  PET CT scan for further evaluation.  She cannot receive IV contrast due to renal insufficiency. -We will also check a CBC and LDH level today.  We will see her back after the PET scan.  2.  Family history: -Mother reportedly had cancer, type unknown to the patient. -Brother had lung cancer.

## 2019-02-15 ENCOUNTER — Encounter (HOSPITAL_COMMUNITY): Payer: Self-pay

## 2019-02-15 ENCOUNTER — Ambulatory Visit (HOSPITAL_COMMUNITY)
Admission: RE | Admit: 2019-02-15 | Discharge: 2019-02-15 | Disposition: A | Discharge status: Home / Self Care | Payer: Medicare HMO | Source: Ambulatory Visit | Attending: Hematology | Admitting: Hematology

## 2019-02-15 ENCOUNTER — Other Ambulatory Visit: Payer: Self-pay

## 2019-02-15 DIAGNOSIS — R161 Splenomegaly, not elsewhere classified: Secondary | ICD-10-CM | POA: Insufficient documentation

## 2019-02-17 ENCOUNTER — Ambulatory Visit (HOSPITAL_COMMUNITY): Payer: Medicare HMO | Admitting: Hematology

## 2019-02-23 ENCOUNTER — Ambulatory Visit: Payer: Medicare HMO | Admitting: Family Medicine

## 2019-02-24 ENCOUNTER — Encounter: Payer: Self-pay | Admitting: Family Medicine

## 2019-02-25 ENCOUNTER — Ambulatory Visit (HOSPITAL_COMMUNITY): Payer: Medicare HMO

## 2019-03-01 ENCOUNTER — Ambulatory Visit (HOSPITAL_COMMUNITY): Payer: Medicare HMO | Admitting: Hematology

## 2019-03-03 ENCOUNTER — Ambulatory Visit (HOSPITAL_COMMUNITY)
Admission: RE | Admit: 2019-03-03 | Discharge: 2019-03-03 | Disposition: A | Discharge status: Home / Self Care | Payer: Medicare HMO | Source: Ambulatory Visit | Attending: Hematology | Admitting: Hematology

## 2019-03-03 ENCOUNTER — Other Ambulatory Visit: Payer: Self-pay

## 2019-03-03 DIAGNOSIS — E119 Type 2 diabetes mellitus without complications: Secondary | ICD-10-CM | POA: Diagnosis not present

## 2019-03-03 DIAGNOSIS — R161 Splenomegaly, not elsewhere classified: Secondary | ICD-10-CM | POA: Insufficient documentation

## 2019-03-03 LAB — GLUCOSE, CAPILLARY: Glucose-Capillary: 119 mg/dL — ABNORMAL HIGH (ref 70–99)

## 2019-03-03 IMAGING — CT NM PET TUM IMG INITIAL (PI) SKULL BASE T - THIGH
7 series · 25 of 25 positions shown · non-contrast
Comparison: [DATE] unenhanced MRI abdomen. [DATE]
unenhanced CT abdomen/pelvis. [DATE] renal sonogram.

CLINICAL DATA: Initial treatment strategy for indeterminate splenic
mass. Additional history of indeterminate right renal mass. History
renal insufficiency.

EXAM:
NUCLEAR MEDICINE PET SKULL BASE TO THIGH
TECHNIQUE: 6.7 mCi F-18 FDG was injected intravenously. Full-ring PET imaging
was performed from the skull base to thigh after the radiotracer. CT
data was obtained and used for attenuation correction and anatomic
localization.
Fasting blood glucose: 119 mg/dl

[Series 2: ac ct sk_thigh 5.0 hd_fov · axial · 5.0mm · 1.52mm/px · z∈[-780,+20]mm · 5 of 201 slices shown]
[im 1/201]
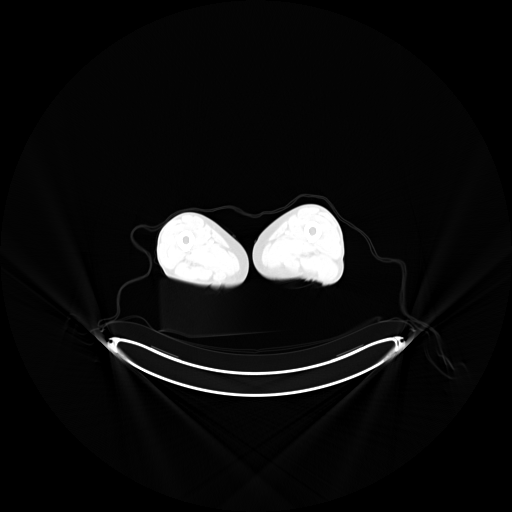
[im 51/201]
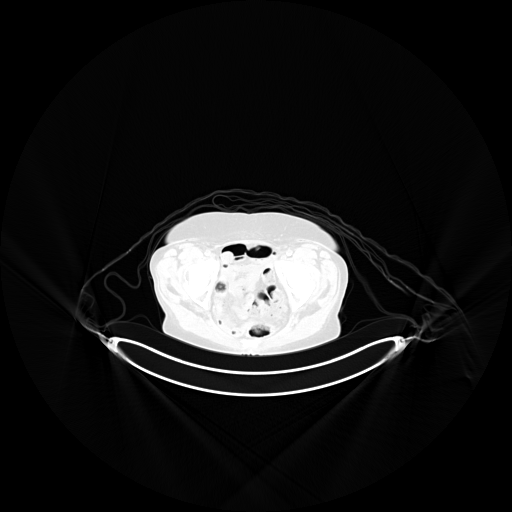
[im 101/201]
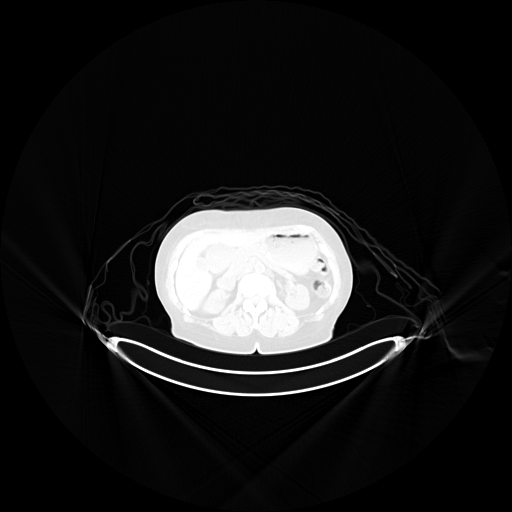
[im 151/201]
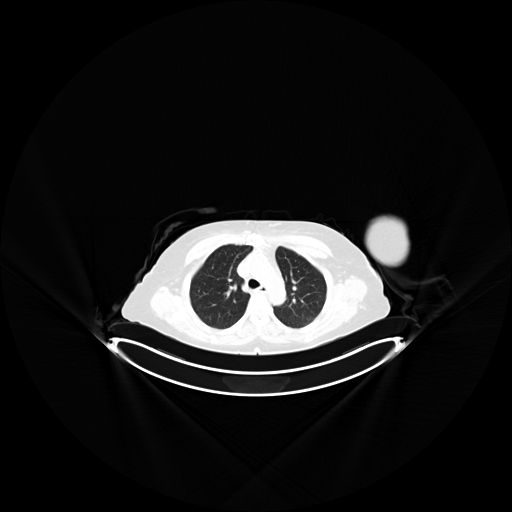
[im 201/201]
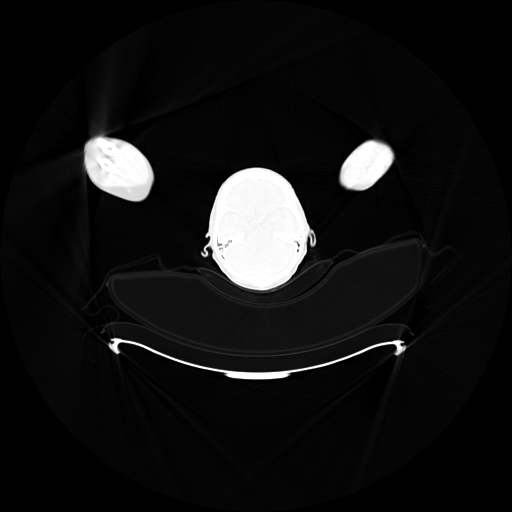

[Series 3: pet sk_thigh ac · axial · 5.0mm · 4.07mm/px · z∈[-780,+20]mm · 5 of 201 slices shown]
[im 1/201]
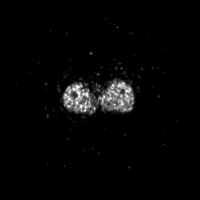
[im 51/201]
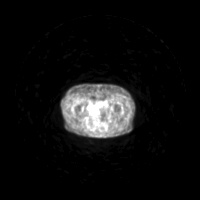
[im 101/201]
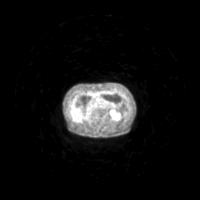
[im 151/201]
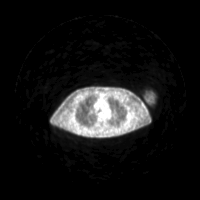
[im 201/201]
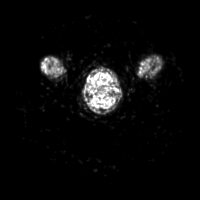

[Series 5: pet sk_thigh nac · axial · 5.0mm · 4.07mm/px · z∈[-780,+20]mm · 5 of 201 slices shown]
[im 1/201]
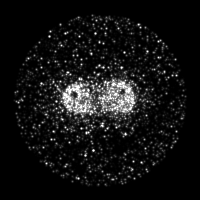
[im 51/201]
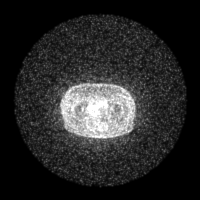
[im 101/201]
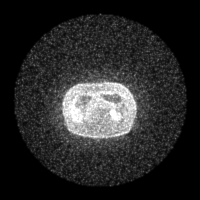
[im 151/201]
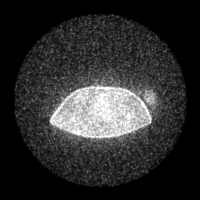
[im 201/201]
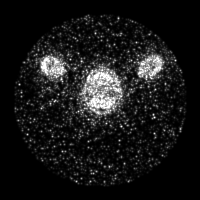

[Series 8: ct sk_thigh 5.0 b70f lung_bone · axial · 5.0mm · 0.57mm/px · z∈[-328,-96]mm · 2 of 59 slices shown]
[im 1/59  bone]
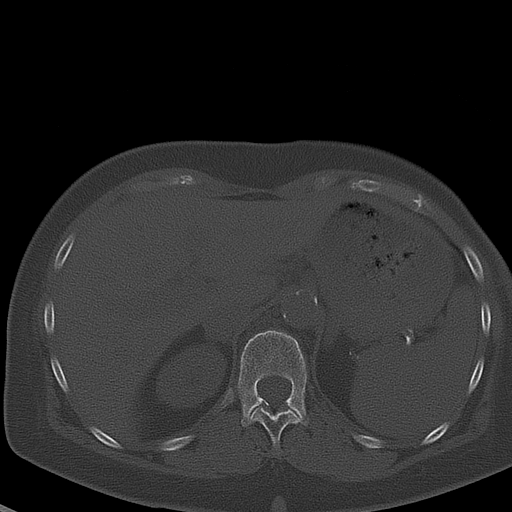
[im 59/59  bone]
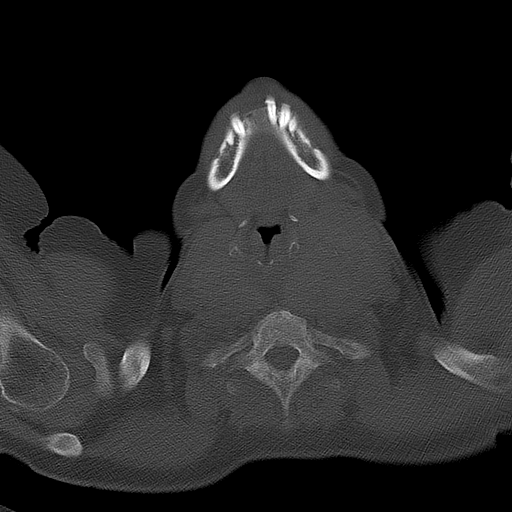

[Series 603: range-ac ct sk_thigh 5.0 hd_fov-cor-<alpha range> · 2 of 86 slices shown]
[im 1/86]
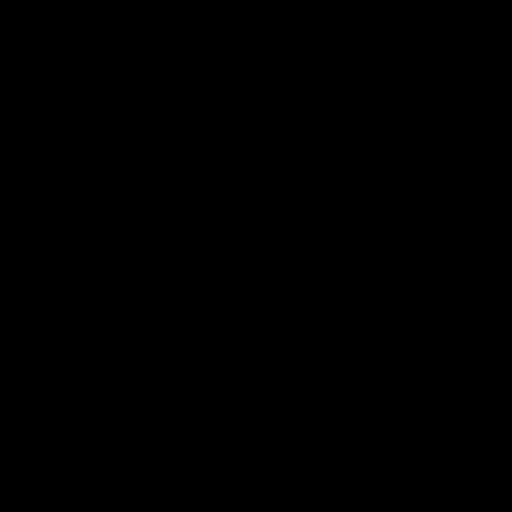
[im 86/86]
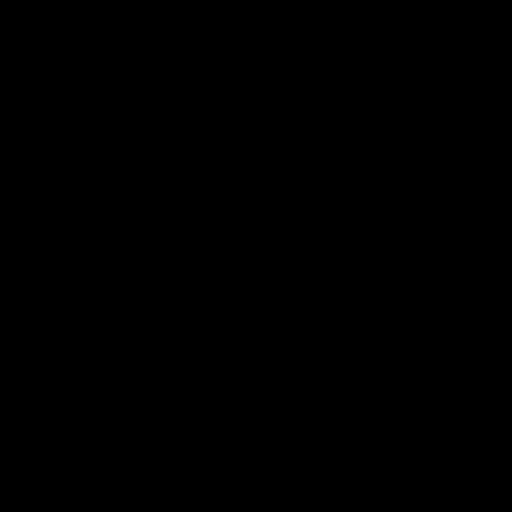

[Series 604: mip range 2 · coronal · 1.68mm/px · 1 of 32 slices shown]
[im 1/32]
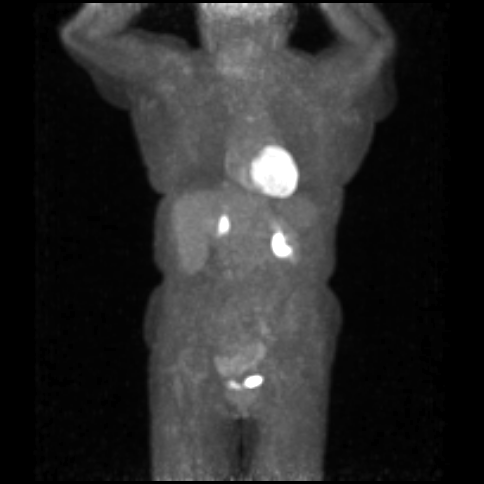

[Series 605: range-ac ct sk_thigh 5.0 hd_fov-tra-<alpha range> · 5 of 195 slices shown]
[im 1/195]
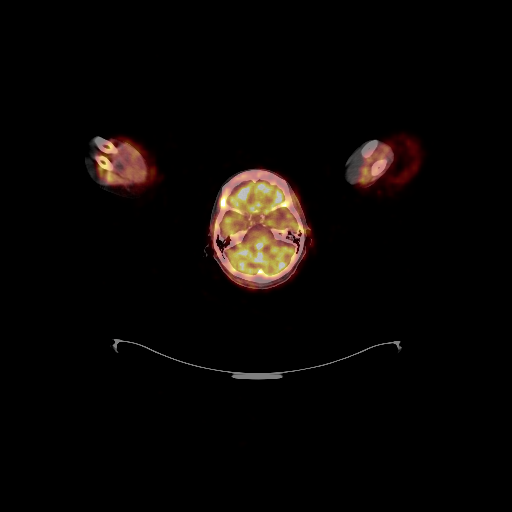
[im 49/195]
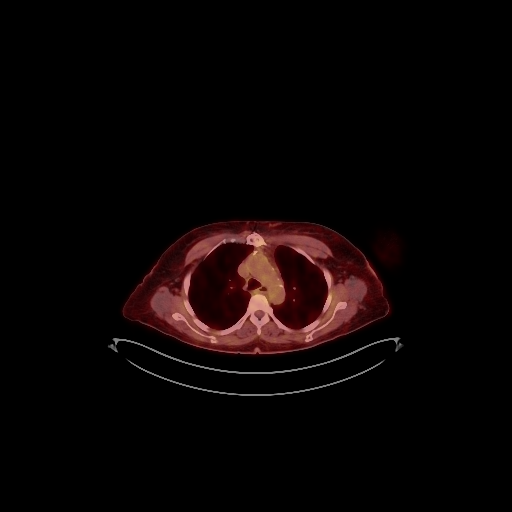
[im 98/195]
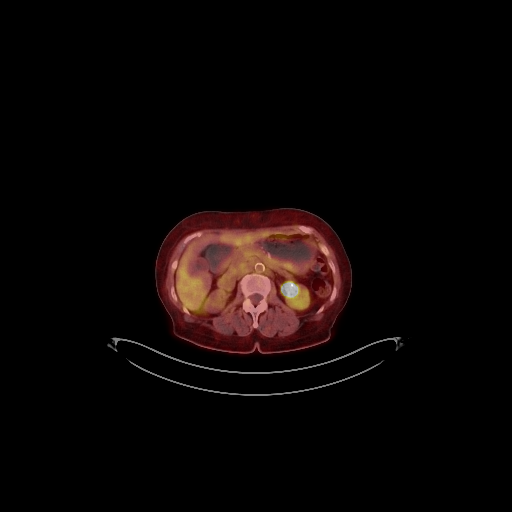
[im 146/195]
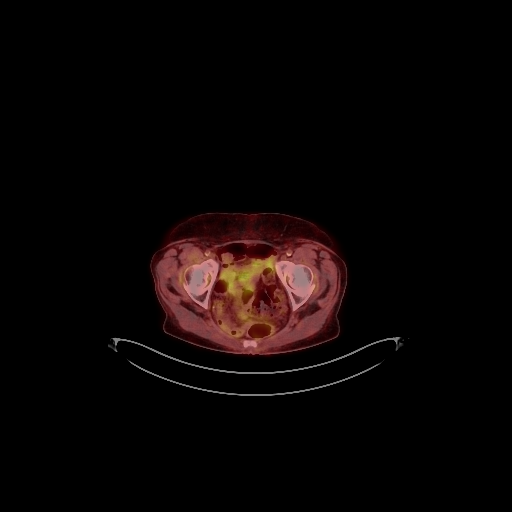
[im 195/195]
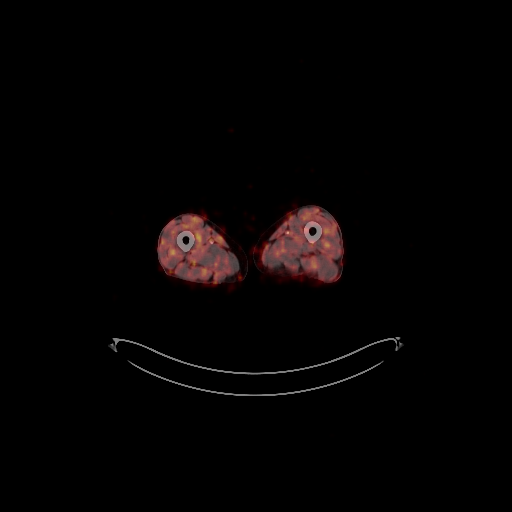

[25 of 25 positions shown; findings below may reference images not displayed]

FINDINGS: Mediastinal blood pool activity: SUV max

Liver activity: SUV max NA

NECK: No hypermetabolic lymph nodes in the neck.

Incidental CT findings: Large goiter with scattered calcifications
and no discrete thyroid nodules. No focal thyroid hypermetabolism.
Moderate narrowing and slight right deviation of the trachea at the
level of the thoracic inlet by the goiter.

CHEST: No enlarged or hypermetabolic axillary, mediastinal or hilar
lymph nodes. No hypermetabolic pulmonary findings.

Incidental CT findings: Intact sternotomy wires. Coronary
atherosclerosis status post CABG. Atherosclerotic nonaneurysmal
thoracic aorta. No acute consolidative airspace disease, lung masses
or significant pulmonary nodules.

ABDOMEN/PELVIS:

No splenic hypermetabolism. Known solitary 4 cm splenic mass is
occult on the noncontrast CT images and demonstrates no uptake above
the background splenic parenchyma.

Known 3.6 x 3.3 cm soft tissue density partially exophytic
interpolar right renal cortical mass (series 2/image 94) appears to
demonstrate hypermetabolism with max SUV 3.4.

No abnormal hypermetabolic activity within the liver, pancreas or
adrenal glands. No hypermetabolic lymph nodes in the abdomen or
pelvis.

Incidental CT findings: Atherosclerotic nonaneurysmal abdominal
aorta. Moderate colonic stool.

SKELETON: No focal hypermetabolic activity to suggest skeletal
metastasis.

Incidental CT findings: none
IMPRESSION: 1. Known solitary 4 cm splenic mass as seen on [DATE] unenhanced
MRI abdomen study is occult on the noncontrast CT images and
demonstrates no uptake above the background splenic parenchyma, and
is probably benign. Follow-up noncontrast MRI abdomen was
recommended in 6-12 months on the [DATE] MRI abdomen report.
2. Known 3.6 cm soft tissue density partially exophytic interpolar
right renal cortical mass appears to demonstrate hypermetabolism,
with the caveat that evaluation of renal masses is generally
precluded on PET-CT given urinary excretion of radiotracer. Renal
cell carcinoma is the diagnosis of exclusion. Percutaneous biopsy of
this mass could be considered as clinically warranted. Continued
imaging surveillance recommended.
3. No evidence of hypermetabolic metastatic disease.
4. Chronic findings include: Aortic Atherosclerosis ([6K]-[6K]).
Prominent goiter with associated moderate tracheal narrowing.

## 2019-03-03 MED ORDER — FLUDEOXYGLUCOSE F - 18 (FDG) INJECTION
6.6800 | Freq: Once | INTRAVENOUS | Status: AC | PRN
  Administered 2019-03-03: 15:00:00 6.68 via INTRAVENOUS

## 2019-03-08 ENCOUNTER — Other Ambulatory Visit: Payer: Self-pay

## 2019-03-09 ENCOUNTER — Encounter (HOSPITAL_COMMUNITY): Payer: Self-pay | Admitting: Hematology

## 2019-03-09 ENCOUNTER — Inpatient Hospital Stay (HOSPITAL_COMMUNITY): Payer: Medicare HMO | Attending: Hematology | Admitting: Hematology

## 2019-03-09 DIAGNOSIS — N2889 Other specified disorders of kidney and ureter: Secondary | ICD-10-CM

## 2019-03-09 DIAGNOSIS — Z801 Family history of malignant neoplasm of trachea, bronchus and lung: Secondary | ICD-10-CM | POA: Diagnosis not present

## 2019-03-09 NOTE — Progress Notes (Signed)
[place], [place] [address]   CLINIC:  Medical Oncology/Hematology  PCP:  [name], [address] [place] [id number] [phone]   REASON FOR VISIT:  Follow-up for right kidney mass.  CURRENT THERAPY: Work-up in progress.   INTERVAL HISTORY:  Erin Jordan 73 y.o. female seen for follow-up of her right kidney mass.  She underwent PET CT scan on March 03, 2019.  She is accompanied by her granddaughter today.  Ported appetite and energy levels of 100%.  She follows up with Dr. Lowanda Foster for CKD.  Mild dizziness is stable.  No new pains reported.  She has mild dementia.  Denies any hematuria.    REVIEW OF SYSTEMS:  Review of Systems  Neurological: Positive for dizziness.  All other systems reviewed and are negative.    PAST MEDICAL/SURGICAL HISTORY:  Past Medical History:  Diagnosis Date  . Arthritis   . Chronic kidney disease, stage 3, mod decreased GFR   . Coronary atherosclerosis of native coronary artery 2006   Multivessel status post CABG in [place], graft disease documented March 2019 with DES to SVG to diagonal  . Diabetes mellitus, type 2 ([facility])   . Essential hypertension, benign   . 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 Hyperlipidemia   . NSTEMI (non-ST elevated myocardial infarction) ([place]) 06/24/2017   Past Surgical History:  Procedure Laterality Date  . ABDOMINAL HYSTERECTOMY    . CORONARY ARTERY BYPASS GRAFT  2006   [place], [place]  . CORONARY STENT INTERVENTION N/A 06/25/2017   Procedure: CORONARY STENT INTERVENTION;  Surgeon: Jettie Booze, MD;  Location: [facility];  Service: Cardiovascular;  Laterality: N/A;  . LEFT HEART CATH AND CORS/GRAFTS ANGIOGRAPHY N/A 06/25/2017   Procedure: LEFT HEART CATH AND CORS/GRAFTS ANGIOGRAPHY;  Surgeon: Jettie Booze, MD;  Location: [facility];  Service: Cardiovascular;  Laterality: N/A;     SOCIAL HISTORY:  Social  History   Socioeconomic History  . Marital status: Widowed    Spouse name: Not on file  . Number of children: 6  . Years of education: Not on file  . Highest education level: Not on file  Occupational History  . Occupation: retired  Scientific laboratory technician  . Financial resource strain: Somewhat hard  . Food insecurity    Worry: Never true    Inability: Never true  . Transportation needs    Medical: No    Non-medical: No  Tobacco Use  . Smoking status: Never Smoker  . Smokeless tobacco: Never Used  Substance and Sexual Activity  . Alcohol use: No  . Drug use: No  . Sexual activity: Never  Lifestyle  . Physical activity    Days per week: 0 days    Minutes per session: 0 min  . Stress: Only a little  Relationships  . Social Herbalist on phone: Three times a week    Gets together: Three times a week    Attends religious service: 1 to 4 times per year    Active member of club or organization: No    Attends meetings of clubs or organizations: Never    Relationship status: Widowed  . Intimate partner violence    Fear of current or ex partner: No    Emotionally abused: No    Physically abused: No    Forced sexual activity: No  Other Topics Concern  . Not on file  Social History Narrative  Lives in [place] with her daughter and grandchildren.    FAMILY HISTORY:  Family History  Problem Relation Age of Onset  . Heart attack Mother   . Hypertension Mother   . Seizures Son   . Seizures Son   . Seizures Daughter     CURRENT MEDICATIONS:  Outpatient Encounter Medications as of 03/09/2019  Medication Sig  . aspirin EC 81 MG tablet Take 1 tablet (81 mg total) by mouth daily.  . calcium carbonate (OSCAL) 1500 (600 Ca) MG TABS tablet Take 1,500 mg by mouth daily with breakfast.   . cholecalciferol (VITAMIN D) 25 MCG (1000 UT) tablet Take 1,000 Units by mouth daily.   Marland Kitchen donepezil (ARICEPT) 5 MG tablet Take 1 tablet (5 mg total) by mouth at bedtime.  . methimazole  (TAPAZOLE) 5 MG tablet Take 1 tablet (5 mg total) by mouth daily.  . metoprolol succinate (TOPROL XL) 25 MG 24 hr tablet Take 1 tablet (25 mg total) by mouth daily.  . nitroGLYCERIN (NITROSTAT) 0.4 MG SL tablet Place 1 tablet (0.4 mg total) under the tongue every 5 (five) minutes x 3 doses as needed for chest pain.  Marland Kitchen atorvastatin (LIPITOR) 40 MG tablet Take 1 tablet (40 mg total) by mouth daily.   No facility-administered encounter medications on file as of 03/09/2019.     ALLERGIES:  No Known Allergies   PHYSICAL EXAM:  ECOG Performance status: 1  Vitals:   03/09/19 1100  BP: (!) 177/107  Pulse: 85  Resp: 16  Temp: (!) 97.4 F (36.3 C)  SpO2: 100%   Filed Weights   03/09/19 1100  Weight: 122 lb 2 oz (55.4 kg)    Physical Exam Vitals signs reviewed.  Constitutional:      Appearance: Normal appearance.  Cardiovascular:     Rate and Rhythm: Normal rate and regular rhythm.     Heart sounds: Normal heart sounds.  Pulmonary:     Effort: Pulmonary effort is normal.     Breath sounds: Normal breath sounds.  Abdominal:     General: There is no distension.     Palpations: Abdomen is soft. There is no mass.  Skin:    General: Skin is warm.  Neurological:     General: No focal deficit present.     Mental Status: She is alert and oriented to person, place, and time.  Psychiatric:        Mood and Affect: Mood normal.        Behavior: Behavior normal.      LABORATORY DATA:  I have reviewed the labs as listed.  CBC    Component Value Date/Time   WBC 5.7 01/29/2019 0941   RBC 4.31 01/29/2019 0941   HGB 13.3 01/29/2019 0941   HCT 40.6 01/29/2019 0941   PLT 198 01/29/2019 0941   MCV 94.2 01/29/2019 0941   MCH 30.9 01/29/2019 0941   MCHC 32.8 01/29/2019 0941   RDW 12.6 01/29/2019 0941   LYMPHSABS 1.5 01/29/2019 0941   MONOABS 0.5 01/29/2019 0941   EOSABS 0.2 01/29/2019 0941   BASOSABS 0.0 01/29/2019 0941   CMP Latest Ref Rng & Units 01/29/2019 04/24/2018 07/01/2017   Glucose 70 - 99 mg/dL 125(H) 145(H) 76  BUN 8 - 23 mg/dL 38(H) 50(H) 49(H)  Creatinine 0.44 - 1.00 mg/dL 1.61(H) 2.29(H) 1.83(H)  Sodium 135 - 145 mmol/L 142 137 141  Potassium 3.5 - 5.1 mmol/L 4.1 3.2(L) 3.9  Chloride 98 - 111 mmol/L 107 105 106  CO2 22 -  32 mmol/L 27 21(L) 25  Calcium 8.9 - 10.3 mg/dL 9.6 8.9 9.4  Total Protein 6.5 - 8.1 g/dL 7.1 7.2 -  Total Bilirubin 0.3 - 1.2 mg/dL 0.6 0.5 -  Alkaline Phos 38 - 126 U/L 85 97 -  AST 15 - 41 U/L 17 57(H) -  ALT 0 - 44 U/L 16 72(H) -       DIAGNOSTIC IMAGING:  I have independently reviewed the scans and discussed with the patient.      ASSESSMENT & PLAN:   Right renal mass 1.  Right kidney mass: -CT scan on 04/24/2018 showed suspicious mass in the right kidney. -As her renal function gotten worse recently, an ultrasound in July 2020 showed solid right kidney mass measuring 3.6 cm. -MRI of the abdomen on 12/12/2018 without contrast was suspicious for renal cell cancer, although limited by lack of contrast.  MRI also showed indeterminate mass in the spleen, measuring 4 cm. -We reviewed results of the PET scan dated March 03, 2019 which showed 3.6 cm soft tissue density, partially exophytic in the right renal cortical area with SUV 3.4.  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  No evidence of hypermetabolic metastatic disease. -We will make a referral to urology for further management of right kidney mass. -We will see her back in 3 to 4 months for follow-up.  2.  Family history: -Mother reportedly had cancer, type unknown to the patient. -Brother had lung cancer.      Orders placed this encounter:  No orders of the defined types were placed in this encounter.     Derek Jack, MD [place] [phone]

## 2019-03-09 NOTE — Patient Instructions (Addendum)
[place] at [facility] Discharge Instructions  You were seen today by Dr. Delton Coombes. He went over your recent test results. The scan showed that the spot on the kidney is lighting up which is suspicious for cancer. He will refer you to the urologist for evaluation. He will see you back in 3 months for follow up.   Thank you for choosing [place] at [facility] to provide your oncology and hematology care.  To afford each patient quality time with our provider, please arrive at least 15 minutes before your scheduled appointment time.   If you have a lab appointment with the [facility] please come in thru the  Main Entrance and check in at the main information desk  You need to re-schedule your appointment should you arrive 10 or more minutes late.  We strive to give you quality time with our providers, and arriving late affects you and other patients whose appointments are after yours.  Also, if you no show three or more times for appointments you may be dismissed from the clinic at the providers discretion.     Again, thank you for choosing [facility].  Our hope is that these requests will decrease the amount of time that you wait before being seen by our physicians.       _____________________________________________________________  Should you have questions after your visit to [facility], please contact our office at [phone] between the hours of 8:00 a.m. and 4:30 p.m.  Voicemails left after 4:00 p.m. will not be returned until the following business day.  For prescription refill requests, have your pharmacy contact our office and allow 72 hours.    Cancer Center Support Programs:   > Cancer Support Group  2nd Tuesday of the month 1pm-2pm, Journey Room

## 2019-03-09 NOTE — Assessment & Plan Note (Signed)
1.  Right kidney mass: -CT scan on 04/24/2018 showed suspicious mass in the right kidney. -As her renal function gotten worse recently, an ultrasound in July 2020 showed solid right kidney mass measuring 3.6 cm. -MRI of the abdomen on 12/12/2018 without contrast was suspicious for renal cell cancer, although limited by lack of contrast.  MRI also showed indeterminate mass in the spleen, measuring 4 cm. -We reviewed results of the PET scan dated March 03, 2019 which showed 3.6 cm soft tissue density, partially exophytic in the right renal cortical area with SUV 3.4.  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  No evidence of hypermetabolic metastatic disease. -We will make a referral to urology for further management of right kidney mass. -We will see her back in 3 to 4 months for follow-up.  2.  Family history: -Mother reportedly had cancer, type unknown to the patient. -Brother had lung cancer.

## 2019-03-10 ENCOUNTER — Encounter (HOSPITAL_COMMUNITY): Payer: Self-pay | Admitting: Lab

## 2019-03-10 NOTE — Progress Notes (Unsigned)
Referral sent to [facility].  Records faxed on 12/2

## 2019-03-14 NOTE — Progress Notes (Deleted)
Assessment & Plan:  ***  No follow-ups on file.  Hendricks Limes, MSN, APRN, FNP-C [facility]  Subjective:    Patient ID: Erin Jordan, female    DOB: November 23, 1945, 73 y.o.   MRN: [id number]  Patient Care Team: [name], FNP as PCP - General (Family Medicine) Satira Sark, MD as PCP - Cardiology (Cardiology)   Chief Complaint: No chief complaint on file.   HPI: Erin Jordan is a 73 y.o. female presenting on 03/16/2019 for No chief complaint on file.  Patient is following up on hypertension, dementia, and her thyroid.  At patient's last visit she was advised to keep a blood pressure log at home ***  Patient was supposed to have a total thyroidectomy due to a large multinodular goiter that was postponed due to COVID-19. Dr. Armandina Gemma at [facility] was to further evaluate and treat per Dr. Aviva Signs. She has completed Brilinta. She is currently out of the methimazole 5 mg that was prescribed by endocrinology. The granddaughter reports the surgery is now being postponed as they were told the patient's kidney issues need to be addressed first.   She also has dementia. While this could be due to her thyroid, it is worsening.   [facility] paperwork?  New complaints: ***  Social history:  Relevant past medical, surgical, family and social history reviewed and updated as indicated. Interim medical history since our last visit reviewed.  Allergies and medications reviewed and updated.  DATA REVIEWED: CHART IN EPIC  ROS: Negative unless specifically indicated above in HPI.    Current Outpatient Medications:  .  aspirin EC 81 MG tablet, Take 1 tablet (81 mg total) by mouth daily., Disp: 90 tablet, Rfl: 3 .  atorvastatin (LIPITOR) 40 MG tablet, Take 1 tablet (40 mg total) by mouth daily., Disp: 90 tablet, Rfl: 3 .  calcium carbonate (OSCAL) 1500 (600 Ca) MG TABS tablet, Take 1,500 mg by mouth daily  with breakfast. , Disp: , Rfl:  .  cholecalciferol (VITAMIN D) 25 MCG (1000 UT) tablet, Take 1,000 Units by mouth daily. , Disp: , Rfl:  .  donepezil (ARICEPT) 5 MG tablet, Take 1 tablet (5 mg total) by mouth at bedtime., Disp: 30 tablet, Rfl: 2 .  methimazole (TAPAZOLE) 5 MG tablet, Take 1 tablet (5 mg total) by mouth daily., Disp: 30 tablet, Rfl: 3 .  metoprolol succinate (TOPROL XL) 25 MG 24 hr tablet, Take 1 tablet (25 mg total) by mouth daily., Disp: 90 tablet, Rfl: 3 .  nitroGLYCERIN (NITROSTAT) 0.4 MG SL tablet, Place 1 tablet (0.4 mg total) under the tongue every 5 (five) minutes x 3 doses as needed for chest pain., Disp: 25 tablet, Rfl: 3   No Known Allergies Past Medical History:  Diagnosis Date  . Arthritis   . Chronic kidney disease, stage 3, mod decreased GFR   . Coronary atherosclerosis of native coronary artery 2006   Multivessel status post CABG in [place], graft disease documented March 2019 with DES to SVG to diagonal  . Diabetes mellitus, type 2 ([place])   . Essential hypertension, benign   . 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 Hyperlipidemia   . NSTEMI (non-ST elevated myocardial infarction) ([place]) 06/24/2017    Past Surgical History:  Procedure Laterality Date  . ABDOMINAL HYSTERECTOMY    . CORONARY ARTERY BYPASS GRAFT  2006   [place], [place]  . CORONARY STENT INTERVENTION N/A 06/25/2017   Procedure: CORONARY  STENT INTERVENTION;  Surgeon: Jettie Booze, MD;  Location: [facility];  Service: Cardiovascular;  Laterality: N/A;  . LEFT HEART CATH AND CORS/GRAFTS ANGIOGRAPHY N/A 06/25/2017   Procedure: LEFT HEART CATH AND CORS/GRAFTS ANGIOGRAPHY;  Surgeon: Jettie Booze, MD;  Location: [facility];  Service: Cardiovascular;  Laterality: N/A;    Social History   Socioeconomic History  . Marital status: Widowed    Spouse name: Not on file  . Number of children: 6  . Years of education: Not on file  . Highest education level: Not  on file  Occupational History  . Occupation: retired  Scientific laboratory technician  . Financial resource strain: Somewhat hard  . Food insecurity    Worry: Never true    Inability: Never true  . Transportation needs    Medical: No    Non-medical: No  Tobacco Use  . Smoking status: Never Smoker  . Smokeless tobacco: Never Used  Substance and Sexual Activity  . Alcohol use: No  . Drug use: No  . Sexual activity: Never  Lifestyle  . Physical activity    Days per week: 0 days    Minutes per session: 0 min  . Stress: Only a little  Relationships  . Social Herbalist on phone: Three times a week    Gets together: Three times a week    Attends religious service: 1 to 4 times per year    Active member of club or organization: No    Attends meetings of clubs or organizations: Never    Relationship status: Widowed  . Intimate partner violence    Fear of current or ex partner: No    Emotionally abused: No    Physically abused: No    Forced sexual activity: No  Other Topics Concern  . Not on file  Social History Narrative   Lives in [place] with her daughter and grandchildren.        Objective:    There were no vitals taken for this visit.  Physical Exam  No results found for: TSH Lab Results  Component Value Date   WBC 5.7 01/29/2019   HGB 13.3 01/29/2019   HCT 40.6 01/29/2019   MCV 94.2 01/29/2019   PLT 198 01/29/2019   Lab Results  Component Value Date   NA 142 01/29/2019   K 4.1 01/29/2019   CO2 27 01/29/2019   GLUCOSE 125 (H) 01/29/2019   BUN 38 (H) 01/29/2019   CREATININE 1.61 (H) 01/29/2019   BILITOT 0.6 01/29/2019   ALKPHOS 85 01/29/2019   AST 17 01/29/2019   ALT 16 01/29/2019   PROT 7.1 01/29/2019   ALBUMIN 4.2 01/29/2019   CALCIUM 9.6 01/29/2019   ANIONGAP 8 01/29/2019   Lab Results  Component Value Date   CHOL 200 06/02/2011   Lab Results  Component Value Date   HDL 45 06/02/2011   Lab Results  Component Value Date   LDLCALC 137 (H)  06/02/2011   Lab Results  Component Value Date   TRIG 91 06/02/2011   Lab Results  Component Value Date   CHOLHDL 4.4 06/02/2011   Lab Results  Component Value Date   HGBA1C 5.9 01/25/2019           This encounter was created in error - please disregard.

## 2019-03-16 ENCOUNTER — Encounter: Payer: Medicare HMO | Admitting: Family Medicine

## 2019-03-17 ENCOUNTER — Encounter: Payer: Self-pay | Admitting: Family Medicine

## 2019-03-17 ENCOUNTER — Ambulatory Visit: Payer: Medicare HMO | Admitting: Family Medicine

## 2019-03-24 ENCOUNTER — Other Ambulatory Visit: Payer: Self-pay

## 2019-03-24 ENCOUNTER — Encounter: Payer: Self-pay | Admitting: Family Medicine

## 2019-03-24 ENCOUNTER — Ambulatory Visit (INDEPENDENT_AMBULATORY_CARE_PROVIDER_SITE_OTHER): Payer: Medicare HMO | Admitting: Family Medicine

## 2019-03-24 VITALS — BP 172/88 | HR 88 | Temp 97.1°F | Ht 61.0 in | Wt 121.2 lb

## 2019-03-24 DIAGNOSIS — I10 Essential (primary) hypertension: Secondary | ICD-10-CM | POA: Diagnosis not present

## 2019-03-24 DIAGNOSIS — E042 Nontoxic multinodular goiter: Secondary | ICD-10-CM | POA: Diagnosis not present

## 2019-03-24 DIAGNOSIS — E785 Hyperlipidemia, unspecified: Secondary | ICD-10-CM

## 2019-03-24 DIAGNOSIS — F0390 Unspecified dementia without behavioral disturbance: Secondary | ICD-10-CM | POA: Diagnosis not present

## 2019-03-24 DIAGNOSIS — N2889 Other specified disorders of kidney and ureter: Secondary | ICD-10-CM

## 2019-03-24 MED ORDER — DONEPEZIL HCL 10 MG PO TABS: 10.0000 mg | ORAL_TABLET | Freq: Every day | ORAL | 2 refills | Status: DC

## 2019-03-24 MED ORDER — AMLODIPINE BESYLATE 5 MG PO TABS: 5.0000 mg | ORAL_TABLET | Freq: Every day | ORAL | 2 refills | Status: DC

## 2019-03-24 NOTE — Progress Notes (Signed)
Assessment & Plan:  1. Essential hypertension - Education provided on the DASH diet.  Encouraged patient to keep a log of her blood pressure and bring it with her to her next appointment. - amLODipine (NORVASC) 5 MG tablet; Take 1 tablet (5 mg total) by mouth daily.  Dispense: 30 tablet; Refill: 2  2. Dementia without behavioral disturbance, unspecified dementia type (HCC) - Increased Aricept from 5 mg to 10 mg at bedtime.  Discussed with patient and granddaughter that her thyroid could still be a likely cause or contributing factor of her dementia.  Patient has not been back to see the endocrinologist as her kidneys have been of more importance. - donepezil (ARICEPT) 10 MG tablet; Take 1 tablet (10 mg total) by mouth at bedtime.  Dispense: 30 tablet; Refill: 2  3. Multinodular goiter - Patient does need to get back into see the endocrinologist but after a plan is in place for her kidneys and the right renal mass. - Thyroid Panel With TSH  4. Dyslipidemia, goal LDL below 70 - Lipid Panel  5. Right renal mass - Awaiting appointment with urology.  Encouraged granddaughter to call if it has been 2 weeks and they have not received an appointment yet.   Return 2-3 weeks, for HTN (30 minute appt).  Erin Limes, MSN, APRN, FNP-C [facility]  Subjective:    Patient ID: Erin Jordan, female    DOB: 04-Feb-1946, 73 y.o.   MRN: [id number]  Patient Care Team: [name], FNP as PCP - General (Family Medicine) Derek Jack, MD as Consulting Physician (Hematology) Fran Lowes, MD as Consulting Physician (Nephrology) Satira Sark, MD as Consulting Physician (Cardiology)   Chief Complaint:  Chief Complaint  Patient presents with  . Hypertension    4 week re check   . Dementia    Daughter states it has gotten worse some     HPI: Erin Jordan is a 73 y.o. female presenting on 03/24/2019 for Hypertension (4 week re check ) and  Dementia (Daughter states it has gotten worse some )  Patient is accompanied by her granddaughter who she is okay with being present.  Patient here for follow-up of high blood pressure and dementia.  Since her last appointment she has seen Dr. Delton Coombes, oncologist.  The current plan is to refer to urology for further management of patient's right kidney mass.  The PET scan did not show any evidence of hypermetabolic metastatic disease, the splenic mass is probably benign.  The granddaughter reports they are currently awaiting an appointment with the urologist.  The granddaughter reports she feels her grandmothers dementia has gotten slightly worse although she does feel that Aricept has helped some.  Patient's blood pressure has remained consistently high upon chart review of her visits to other specialist.  The granddaughter reports they were keeping a log of her blood pressure at home but she was keeping in her phone and she no longer has that phone.  She is unable to tell me what the readings were on average.  The granddaughter is requesting that I call Charisa with [employer] as they will reportedly help with the patient's electricity bill.  Phone number is [phone].   Social history:  Relevant past medical, surgical, family and social history reviewed and updated as indicated. Interim medical history since our last visit reviewed.  Allergies and medications reviewed and updated.  DATA REVIEWED: CHART IN EPIC  ROS: Negative unless specifically indicated above in HPI.  Current Outpatient Medications:  .  aspirin EC 81 MG tablet, Take 1 tablet (81 mg total) by mouth daily., Disp: 90 tablet, Rfl: 3 .  calcium carbonate (OSCAL) 1500 (600 Ca) MG TABS tablet, Take 1,500 mg by mouth daily with breakfast. , Disp: , Rfl:  .  cholecalciferol (VITAMIN D) 25 MCG (1000 UT) tablet, Take 1,000 Units by mouth daily. , Disp: , Rfl:  .  donepezil (ARICEPT) 10 MG tablet, Take 1 tablet  (10 mg total) by mouth at bedtime., Disp: 30 tablet, Rfl: 2 .  methimazole (TAPAZOLE) 5 MG tablet, Take 1 tablet (5 mg total) by mouth daily., Disp: 30 tablet, Rfl: 3 .  metoprolol succinate (TOPROL XL) 25 MG 24 hr tablet, Take 1 tablet (25 mg total) by mouth daily., Disp: 90 tablet, Rfl: 3 .  nitroGLYCERIN (NITROSTAT) 0.4 MG SL tablet, Place 1 tablet (0.4 mg total) under the tongue every 5 (five) minutes x 3 doses as needed for chest pain., Disp: 25 tablet, Rfl: 3 .  amLODipine (NORVASC) 5 MG tablet, Take 1 tablet (5 mg total) by mouth daily., Disp: 30 tablet, Rfl: 2 .  atorvastatin (LIPITOR) 80 MG tablet, Take 1 tablet (80 mg total) by mouth daily., Disp: 90 tablet, Rfl: 1   No Known Allergies Past Medical History:  Diagnosis Date  . Arthritis   . Chronic kidney disease, stage 3, mod decreased GFR   . Coronary atherosclerosis of native coronary artery 2006   Multivessel status post CABG in [place], graft disease documented March 2019 with DES to SVG to diagonal  . Diabetes mellitus, type 2 ([place])   . Essential hypertension, benign   . 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 Hyperlipidemia   . NSTEMI (non-ST elevated myocardial infarction) ([place]) 06/24/2017    Past Surgical History:  Procedure Laterality Date  . ABDOMINAL HYSTERECTOMY    . CORONARY ARTERY BYPASS GRAFT  2006   [place], [place]  . CORONARY STENT INTERVENTION N/A 06/25/2017   Procedure: CORONARY STENT INTERVENTION;  Surgeon: Jettie Booze, MD;  Location: [facility];  Service: Cardiovascular;  Laterality: N/A;  . LEFT HEART CATH AND CORS/GRAFTS ANGIOGRAPHY N/A 06/25/2017   Procedure: LEFT HEART CATH AND CORS/GRAFTS ANGIOGRAPHY;  Surgeon: Jettie Booze, MD;  Location: [facility];  Service: Cardiovascular;  Laterality: N/A;    Social History   Socioeconomic History  . Marital status: Widowed    Spouse name: Not on file  . Number of children: 6  . Years of education: Not on file  .  Highest education level: Not on file  Occupational History  . Occupation: retired  Tobacco Use  . Smoking status: Never Smoker  . Smokeless tobacco: Never Used  Substance and Sexual Activity  . Alcohol use: No  . Drug use: No  . Sexual activity: Never  Other Topics Concern  . Not on file  Social History Narrative   Lives in [place] with her daughter and grandchildren.   Social Determinants of Health   Financial Resource Strain: Medium Risk  . Difficulty of Paying Living Expenses: Somewhat hard  Food Insecurity: No Food Insecurity  . Worried About Charity fundraiser in the Last Year: Never true  . Ran Out of Food in the Last Year: Never true  Transportation Needs: No Transportation Needs  . Lack of Transportation (Medical): No  . Lack of Transportation (Non-Medical): No  Physical Activity: Inactive  . Days of Exercise per Week: 0 days  . Minutes  of Exercise per Session: 0 min  Stress: No Stress Concern Present  . Feeling of Stress : Only a little  Social Connections: Somewhat Isolated  . Frequency of Communication with Friends and Family: Three times a week  . Frequency of Social Gatherings with Friends and Family: Three times a week  . Attends Religious Services: 1 to 4 times per year  . Active Member of Clubs or Organizations: No  . Attends Archivist Meetings: Never  . Marital Status: Widowed  Intimate Partner Violence: Not At Risk  . Fear of Current or Ex-Partner: No  . Emotionally Abused: No  . Physically Abused: No  . Sexually Abused: No        Objective:    BP (!) 172/88 Comment: manual  Pulse 88   Temp (!) 97.1 F (36.2 C) (Temporal)   Ht 5\' 1"  (1.549 m)   Wt 121 lb 3.2 oz (55 kg)   SpO2 100%   BMI 22.90 kg/m   Physical Exam Vitals reviewed.  Constitutional:      General: She is not in acute distress.    Appearance: Normal appearance. She is not ill-appearing, toxic-appearing or diaphoretic.  HENT:     Head: Normocephalic and  atraumatic.  Eyes:     General: No scleral icterus.       Right eye: No discharge.        Left eye: No discharge.     Conjunctiva/sclera: Conjunctivae normal.  Cardiovascular:     Rate and Rhythm: Normal rate and regular rhythm.     Heart sounds: Normal heart sounds. No murmur. No friction rub. No gallop.   Pulmonary:     Effort: Pulmonary effort is normal. No respiratory distress.     Breath sounds: Normal breath sounds. No stridor. No wheezing, rhonchi or rales.  Musculoskeletal:        General: Normal range of motion.     Cervical back: Normal range of motion.  Skin:    General: Skin is warm and dry.     Capillary Refill: Capillary refill takes less than 2 seconds.  Neurological:     General: No focal deficit present.     Mental Status: She is alert and oriented to person, place, and time. Mental status is at baseline.  Psychiatric:        Mood and Affect: Mood normal.        Behavior: Behavior normal.        Thought Content: Thought content normal.        Judgment: Judgment normal.     Lab Results  Component Value Date   TSH 0.709 03/24/2019   Lab Results  Component Value Date   WBC 5.7 01/29/2019   HGB 13.3 01/29/2019   HCT 40.6 01/29/2019   MCV 94.2 01/29/2019   PLT 198 01/29/2019   Lab Results  Component Value Date   NA 142 01/29/2019   K 4.1 01/29/2019   CO2 27 01/29/2019   GLUCOSE 125 (H) 01/29/2019   BUN 38 (H) 01/29/2019   CREATININE 1.61 (H) 01/29/2019   BILITOT 0.6 01/29/2019   ALKPHOS 85 01/29/2019   AST 17 01/29/2019   ALT 16 01/29/2019   PROT 7.1 01/29/2019   ALBUMIN 4.2 01/29/2019   CALCIUM 9.6 01/29/2019   ANIONGAP 8 01/29/2019   Lab Results  Component Value Date   CHOL 268 (H) 03/24/2019   Lab Results  Component Value Date   HDL 101 03/24/2019   Lab Results  Component  Value Date   LDLCALC 149 (H) 03/24/2019   Lab Results  Component Value Date   TRIG 108 03/24/2019   Lab Results  Component Value Date   CHOLHDL 2.7  03/24/2019   Lab Results  Component Value Date   HGBA1C 5.9 01/25/2019

## 2019-03-24 NOTE — Patient Instructions (Signed)

## 2019-03-25 ENCOUNTER — Other Ambulatory Visit: Payer: Self-pay | Admitting: Family Medicine

## 2019-03-25 DIAGNOSIS — E785 Hyperlipidemia, unspecified: Secondary | ICD-10-CM

## 2019-03-25 LAB — THYROID PANEL WITH TSH
Free Thyroxine Index: 2.1 (ref 1.2–4.9)
T3 Uptake Ratio: 24 % (ref 24–39)
T4, Total: 8.7 ug/dL (ref 4.5–12.0)
TSH: 0.709 u[IU]/mL (ref 0.450–4.500)

## 2019-03-25 LAB — LIPID PANEL
Chol/HDL Ratio: 2.7 ratio (ref 0.0–4.4)
Cholesterol, Total: 268 mg/dL — ABNORMAL HIGH (ref 100–199)
HDL: 101 mg/dL (ref >39)
LDL Chol Calc (NIH): 149 mg/dL — ABNORMAL HIGH (ref 0–99)
Triglycerides: 108 mg/dL (ref 0–149)
VLDL Cholesterol Cal: 18 mg/dL (ref 5–40)

## 2019-03-25 MED ORDER — ATORVASTATIN CALCIUM 80 MG PO TABS: 80.0000 mg | ORAL_TABLET | Freq: Every day | ORAL | 1 refills | Status: DC

## 2019-03-29 ENCOUNTER — Encounter: Payer: Self-pay | Admitting: Family Medicine

## 2019-04-06 ENCOUNTER — Telehealth: Payer: Self-pay | Admitting: Family Medicine

## 2019-04-06 NOTE — Telephone Encounter (Signed)
Patient is faxing forms to be filled out

## 2019-04-12 NOTE — Progress Notes (Deleted)
Assessment & Plan:  ***  No follow-ups on file.  Hendricks Limes, MSN, APRN, FNP-C [facility]  Subjective:    Patient ID: Erin Jordan, female    DOB: Jun 26, 1945, 74 y.o.   MRN: [id number]  Patient Care Team: [name], FNP as PCP - General (Family Medicine) Donetta Potts, RN as Oncology Nurse Navigator Derek Jack, MD as Consulting Physician (Hematology) Fran Lowes, MD as Consulting Physician (Nephrology) Satira Sark, MD as Consulting Physician (Cardiology)   Chief Complaint: No chief complaint on file.   HPI: Erin Jordan is a 74 y.o. female presenting on 04/13/2019 for No chief complaint on file.  Hypertension: Patient here for follow-up of elevated blood pressure.  Patient was taking metoprolol 25 mg once daily and amlodipine 5 mg once daily was added on 03/24/2019. She {is/is not:9024} exercising and {is/is not:9024} adherent to low salt diet.  Blood pressure {is/is not:9024} well controlled at home. Cardiac symptoms {Symptoms; cardiac:12860}. Patient denies {Symptoms; cardiac:12860}.  Cardiovascular risk factors: advanced age (older than 29 for men, 32 for women), diabetes mellitus, dyslipidemia, hypertension and microalbuminuria. Use of agents associated with hypertension: thyroid hormones. History of target organ damage: angina/ prior myocardial infarction and chronic kidney disease.   New complaints: ***  Social history:  Relevant past medical, surgical, family and social history reviewed and updated as indicated. Interim medical history since our last visit reviewed.  Allergies and medications reviewed and updated.  DATA REVIEWED: CHART IN EPIC  ROS: Negative unless specifically indicated above in HPI.    Current Outpatient Medications:  .  amLODipine (NORVASC) 5 MG tablet, Take 1 tablet (5 mg total) by mouth daily., Disp: 30 tablet, Rfl: 2 .  aspirin EC 81 MG tablet, Take 1 tablet (81 mg total) by  mouth daily., Disp: 90 tablet, Rfl: 3 .  atorvastatin (LIPITOR) 80 MG tablet, Take 1 tablet (80 mg total) by mouth daily., Disp: 90 tablet, Rfl: 1 .  calcium carbonate (OSCAL) 1500 (600 Ca) MG TABS tablet, Take 1,500 mg by mouth daily with breakfast. , Disp: , Rfl:  .  cholecalciferol (VITAMIN D) 25 MCG (1000 UT) tablet, Take 1,000 Units by mouth daily. , Disp: , Rfl:  .  donepezil (ARICEPT) 10 MG tablet, Take 1 tablet (10 mg total) by mouth at bedtime., Disp: 30 tablet, Rfl: 2 .  methimazole (TAPAZOLE) 5 MG tablet, Take 1 tablet (5 mg total) by mouth daily., Disp: 30 tablet, Rfl: 3 .  metoprolol succinate (TOPROL XL) 25 MG 24 hr tablet, Take 1 tablet (25 mg total) by mouth daily., Disp: 90 tablet, Rfl: 3 .  nitroGLYCERIN (NITROSTAT) 0.4 MG SL tablet, Place 1 tablet (0.4 mg total) under the tongue every 5 (five) minutes x 3 doses as needed for chest pain., Disp: 25 tablet, Rfl: 3   No Known Allergies Past Medical History:  Diagnosis Date  . Arthritis   . Chronic kidney disease, stage 3, mod decreased GFR   . Coronary atherosclerosis of native coronary artery 2006   Multivessel status post CABG in [place], graft disease documented March 2019 with DES to SVG to diagonal  . Diabetes mellitus, type 2 ([place])   . Essential hypertension, benign   . 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 Hyperlipidemia   . NSTEMI (non-ST elevated myocardial infarction) ([place]) 06/24/2017  . Right renal mass     Past Surgical History:  Procedure Laterality Date  . ABDOMINAL HYSTERECTOMY    . CORONARY ARTERY  BYPASS GRAFT  2006   [place], [place]  . CORONARY STENT INTERVENTION N/A 06/25/2017   Procedure: CORONARY STENT INTERVENTION;  Surgeon: Jettie Booze, MD;  Location: [facility];  Service: Cardiovascular;  Laterality: N/A;  . LEFT HEART CATH AND CORS/GRAFTS ANGIOGRAPHY N/A 06/25/2017   Procedure: LEFT HEART CATH AND CORS/GRAFTS ANGIOGRAPHY;  Surgeon: Jettie Booze, MD;  Location:  [facility];  Service: Cardiovascular;  Laterality: N/A;    Social History   Socioeconomic History  . Marital status: Widowed    Spouse name: Not on file  . Number of children: 6  . Years of education: Not on file  . Highest education level: Not on file  Occupational History  . Occupation: retired  Tobacco Use  . Smoking status: Never Smoker  . Smokeless tobacco: Never Used  Substance and Sexual Activity  . Alcohol use: No  . Drug use: No  . Sexual activity: Never  Other Topics Concern  . Not on file  Social History Narrative   Lives in [place] with her daughter and grandchildren.   Social Determinants of Health   Financial Resource Strain: Medium Risk  . Difficulty of Paying Living Expenses: Somewhat hard  Food Insecurity: No Food Insecurity  . Worried About Charity fundraiser in the Last Year: Never true  . Ran Out of Food in the Last Year: Never true  Transportation Needs: No Transportation Needs  . Lack of Transportation (Medical): No  . Lack of Transportation (Non-Medical): No  Physical Activity: Inactive  . Days of Exercise per Week: 0 days  . Minutes of Exercise per Session: 0 min  Stress: No Stress Concern Present  . Feeling of Stress : Only a little  Social Connections: Somewhat Isolated  . Frequency of Communication with Friends and Family: Three times a week  . Frequency of Social Gatherings with Friends and Family: Three times a week  . Attends Religious Services: 1 to 4 times per year  . Active Member of Clubs or Organizations: No  . Attends Archivist Meetings: Never  . Marital Status: Widowed  Intimate Partner Violence: Not At Risk  . Fear of Current or Ex-Partner: No  . Emotionally Abused: No  . Physically Abused: No  . Sexually Abused: No        Objective:    There were no vitals taken for this visit.  Physical Exam  Lab Results  Component Value Date   TSH 0.709 03/24/2019   Lab Results  Component Value Date   WBC  5.7 01/29/2019   HGB 13.3 01/29/2019   HCT 40.6 01/29/2019   MCV 94.2 01/29/2019   PLT 198 01/29/2019   Lab Results  Component Value Date   NA 142 01/29/2019   K 4.1 01/29/2019   CO2 27 01/29/2019   GLUCOSE 125 (H) 01/29/2019   BUN 38 (H) 01/29/2019   CREATININE 1.61 (H) 01/29/2019   BILITOT 0.6 01/29/2019   ALKPHOS 85 01/29/2019   AST 17 01/29/2019   ALT 16 01/29/2019   PROT 7.1 01/29/2019   ALBUMIN 4.2 01/29/2019   CALCIUM 9.6 01/29/2019   ANIONGAP 8 01/29/2019   Lab Results  Component Value Date   CHOL 268 (H) 03/24/2019   Lab Results  Component Value Date   HDL 101 03/24/2019   Lab Results  Component Value Date   LDLCALC 149 (H) 03/24/2019   Lab Results  Component Value Date   TRIG 108 03/24/2019   Lab Results  Component Value Date   CHOLHDL 2.7 03/24/2019   Lab Results  Component Value Date   HGBA1C 5.9 01/25/2019

## 2019-04-13 ENCOUNTER — Ambulatory Visit (INDEPENDENT_AMBULATORY_CARE_PROVIDER_SITE_OTHER): Payer: Medicare HMO | Admitting: Family Medicine

## 2019-04-13 ENCOUNTER — Ambulatory Visit: Payer: Medicare HMO | Admitting: Family Medicine

## 2019-04-13 ENCOUNTER — Other Ambulatory Visit: Payer: Self-pay

## 2019-04-13 DIAGNOSIS — I10 Essential (primary) hypertension: Secondary | ICD-10-CM | POA: Diagnosis not present

## 2019-04-13 DIAGNOSIS — N2889 Other specified disorders of kidney and ureter: Secondary | ICD-10-CM

## 2019-04-13 NOTE — Progress Notes (Signed)
Virtual Visit via Telephone Note  I connected with Erin Jordan on 04/16/19 at 3:49 PM by telephone and verified that I am speaking with the correct person using two identifiers. Erin Jordan is currently located in the car and her granddaughter is currently with her during this visit, which she is okay with. The provider, [name], FNP is located in their office at time of visit.  I discussed the limitations, risks, security and privacy concerns of performing an evaluation and management service by telephone and the availability of in person appointments. I also discussed with the patient that there may be a patient responsible charge related to this service. The patient expressed understanding and agreed to proceed.  Subjective: PCP: [name], FNP  Chief Complaint  Patient presents with  . Hypertension   Hypertension: Patient here for follow-up of elevated blood pressure. Blood pressure is well controlled at home. Average systolic 283-151; average diastolic 76H. Cardiac symptoms none. Cardiovascular risk factors: advanced age (older than 33 for men, 11 for women), diabetes mellitus, dyslipidemia and hypertension. Use of agents associated with hypertension: thyroid hormones. History of target organ damage: angina/ prior myocardial infarction and chronic kidney disease.  Patient has an appointment with the urologist on May 10, 2019.  Patient and granddaughter are curious if I was able to get in touch with [place] at [facility].  I have been unable to reach her as of yet.  I will try again today.   ROS: Per HPI  Current Outpatient Medications:  .  amLODipine (NORVASC) 5 MG tablet, Take 1 tablet (5 mg total) by mouth daily., Disp: 30 tablet, Rfl: 2 .  aspirin EC 81 MG tablet, Take 1 tablet (81 mg total) by mouth daily., Disp: 90 tablet, Rfl: 3 .  atorvastatin (LIPITOR) 80 MG tablet, Take 1 tablet (80 mg total) by mouth daily., Disp: 90 tablet, Rfl: 1 .   calcium carbonate (OSCAL) 1500 (600 Ca) MG TABS tablet, Take 1,500 mg by mouth daily with breakfast. , Disp: , Rfl:  .  cholecalciferol (VITAMIN D) 25 MCG (1000 UT) tablet, Take 1,000 Units by mouth daily. , Disp: , Rfl:  .  donepezil (ARICEPT) 10 MG tablet, Take 1 tablet (10 mg total) by mouth at bedtime., Disp: 30 tablet, Rfl: 2 .  methimazole (TAPAZOLE) 5 MG tablet, Take 1 tablet (5 mg total) by mouth daily., Disp: 30 tablet, Rfl: 3 .  metoprolol succinate (TOPROL XL) 25 MG 24 hr tablet, Take 1 tablet (25 mg total) by mouth daily., Disp: 90 tablet, Rfl: 3 .  nitroGLYCERIN (NITROSTAT) 0.4 MG SL tablet, Place 1 tablet (0.4 mg total) under the tongue every 5 (five) minutes x 3 doses as needed for chest pain., Disp: 25 tablet, Rfl: 3  No Known Allergies Past Medical History:  Diagnosis Date  . Arthritis   . Chronic kidney disease, stage 3, mod decreased GFR   . Coronary atherosclerosis of native coronary artery 2006   Multivessel status post CABG in [place], graft disease documented March 2019 with DES to SVG to diagonal  . Essential hypertension, benign   . 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 History of diabetes mellitus, type II   . Hyperlipidemia   . NSTEMI (non-ST elevated myocardial infarction) ([place]) 06/24/2017  . Right renal mass     Observations/Objective: A&O  No respiratory distress or wheezing audible over the phone Mood, judgement, and thought processes all WNL  Assessment and Plan: 1. Essential hypertension -  Well controlled on current regimen.  Continue metoprolol 25 mg once daily and amlodipine 5 mg once daily.  2. Right renal mass - Patient now has an appointment with the urologist scheduled for February.   Follow Up Instructions:  Return in about 3 months (around 07/12/2019) for follow-up of chronic medication conditions.  I discussed the assessment and treatment plan with the patient. The patient was provided an opportunity to ask questions and all  were answered. The patient agreed with the plan and demonstrated an understanding of the instructions.   The patient was advised to call back or seek an in-person evaluation if the symptoms worsen or if the condition fails to improve as anticipated.  The above assessment and management plan was discussed with the patient. The patient verbalized understanding of and has agreed to the management plan. Patient is aware to call the clinic if symptoms persist or worsen. Patient is aware when to return to the clinic for a follow-up visit. Patient educated on when it is appropriate to go to the emergency department.   Time call ended: 4:09 PM  I provided 23 minutes of non-face-to-face time during this encounter.  Hendricks Limes, MSN, APRN, FNP-C [facility] 04/16/19

## 2019-04-16 ENCOUNTER — Encounter: Payer: Self-pay | Admitting: Family Medicine

## 2019-04-22 ENCOUNTER — Telehealth: Payer: Self-pay | Admitting: Family Medicine

## 2019-04-23 NOTE — Telephone Encounter (Signed)
Left message, letter ready.

## 2019-04-30 NOTE — Telephone Encounter (Signed)
Multiple attempts made to contact patient.  This encounter will now be closed  

## 2019-05-19 ENCOUNTER — Other Ambulatory Visit: Payer: Self-pay

## 2019-05-19 ENCOUNTER — Ambulatory Visit (INDEPENDENT_AMBULATORY_CARE_PROVIDER_SITE_OTHER): Payer: Medicare HMO | Admitting: Urology

## 2019-05-19 ENCOUNTER — Encounter: Payer: Self-pay | Admitting: Urology

## 2019-05-19 VITALS — BP 175/81 | HR 75 | Temp 97.0°F | Ht 61.0 in | Wt 127.0 lb

## 2019-05-19 DIAGNOSIS — N2889 Other specified disorders of kidney and ureter: Secondary | ICD-10-CM

## 2019-05-19 LAB — POCT URINALYSIS DIPSTICK
Bilirubin, UA: NEGATIVE
Glucose, UA: NEGATIVE
Ketones, UA: NEGATIVE
Leukocytes, UA: NEGATIVE
Nitrite, UA: NEGATIVE
Protein, UA: POSITIVE — AB
Spec Grav, UA: >=1.03 — AB (ref 1.010–1.025)
Urobilinogen, UA: 0.2 E.U./dL
pH, UA: 5 (ref 5.0–8.0)

## 2019-05-19 NOTE — Progress Notes (Signed)
05/19/2019 10:18 AM   Erin Jordan May 07, 1945 [id number]  Referring provider: [name], [place],  [place] [address]  Right renal mass  HPI: Erin Jordan is a 74yo with a hx of CKD here for evaluation of a right renal mass. She is currently being followed by nephrology and was found to have a right renal mass on renal US. MRI and PET confirmed 3.8-4cm right posterior exophytic mass concerning of malignancy. No flank pain. Creatinine 1.6-2.2.   PMH: Past Medical History:  Diagnosis Date  . Arthritis   . Cancer of kidney ([place])   . Chronic kidney disease, stage 3, mod decreased GFR   . Coronary atherosclerosis of native coronary artery 2006   Multivessel status post CABG in [place], graft disease documented March 2019 with DES to SVG to diagonal  . Essential hypertension, benign   . 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 High cholesterol   . History of diabetes mellitus, type II   . Hyperlipidemia   . NSTEMI (non-ST elevated myocardial infarction) ([place]) 06/24/2017  . Right renal mass     Surgical History: Past Surgical History:  Procedure Laterality Date  . ABDOMINAL HYSTERECTOMY    . CORONARY ARTERY BYPASS GRAFT  2006   [place], [place]  . CORONARY STENT INTERVENTION N/A 06/25/2017   Procedure: CORONARY STENT INTERVENTION;  Surgeon: Jettie Booze, MD;  Location: [facility];  Service: Cardiovascular;  Laterality: N/A;  . LEFT HEART CATH AND CORS/GRAFTS ANGIOGRAPHY N/A 06/25/2017   Procedure: LEFT HEART CATH AND CORS/GRAFTS ANGIOGRAPHY;  Surgeon: Jettie Booze, MD;  Location: [facility];  Service: Cardiovascular;  Laterality: N/A;    Home Medications:  Allergies as of 05/19/2019   No Known Allergies     Medication List       Accurate as of May 19, 2019 10:18 AM. If you have any questions, ask your nurse or doctor.        amLODipine 5 MG tablet Commonly known as: NORVASC Take 1 tablet (5 mg  total) by mouth daily.   aspirin EC 81 MG tablet Take 1 tablet (81 mg total) by mouth daily.   atorvastatin 80 MG tablet Commonly known as: LIPITOR Take 1 tablet (80 mg total) by mouth daily.   calcium carbonate 1500 (600 Ca) MG Tabs tablet Commonly known as: OSCAL Take 1,500 mg by mouth daily with breakfast.   cholecalciferol 25 MCG (1000 UNIT) tablet Commonly known as: VITAMIN D Take 1,000 Units by mouth daily.   donepezil 10 MG tablet Commonly known as: Aricept Take 1 tablet (10 mg total) by mouth at bedtime.   methimazole 5 MG tablet Commonly known as: TAPAZOLE Take 1 tablet (5 mg total) by mouth daily.   metoprolol succinate 25 MG 24 hr tablet Commonly known as: Toprol XL Take 1 tablet (25 mg total) by mouth daily.   nitroGLYCERIN 0.4 MG SL tablet Commonly known as: NITROSTAT Place 1 tablet (0.4 mg total) under the tongue every 5 (five) minutes x 3 doses as needed for chest pain.       Allergies: No Known Allergies  Family History: Family History  Problem Relation Age of Onset  . Heart attack Mother   . Hypertension Mother   . Seizures Son   . Seizures Son   . Seizures Daughter     Social History:  reports that she has never smoked. She has never used smokeless tobacco. She reports that she does  not drink alcohol or use drugs.  ROS: All other review of systems were reviewed and are negative except what is noted above in HPI  Physical Exam: BP (!) 175/81   Pulse 75   Temp (!) 97 F (36.1 C)   Ht 5\' 1"  (1.549 m)   Wt 127 lb (57.6 kg)   BMI 24.00 kg/m   Constitutional:  Alert and oriented, No acute distress. HEENT: [place] AT, moist mucus membranes.  Trachea midline, no masses. Cardiovascular: No clubbing, cyanosis, or edema. Respiratory: Normal respiratory effort, no increased work of breathing. GI: Abdomen is soft, nontender, nondistended, no abdominal masses GU: No CVA tenderness Lymph: No cervical or inguinal lymphadenopathy. Skin: No rashes,  bruises or suspicious lesions. Neurologic: Grossly intact, no focal deficits, moving all 4 extremities. Psychiatric: Normal mood and affect.  Laboratory Data: Lab Results  Component Value Date   WBC 5.7 01/29/2019   HGB 13.3 01/29/2019   HCT 40.6 01/29/2019   MCV 94.2 01/29/2019   PLT 198 01/29/2019    Lab Results  Component Value Date   CREATININE 1.61 (H) 01/29/2019    No results found for: PSA  No results found for: TESTOSTERONE  Lab Results  Component Value Date   HGBA1C 5.9 01/25/2019    Urinalysis    Component Value Date/Time   COLORURINE YELLOW 06/01/2011 [place] 06/01/2011 1822   LABSPEC <1.005 (L) 06/01/2011 1822   PHURINE 6.0 06/01/2011 1822   GLUCOSEU NEGATIVE 06/01/2011 1822   HGBUR TRACE (A) 06/01/2011 1822   BILIRUBINUR neg 05/19/2019 0947   KETONESUR NEGATIVE 06/01/2011 1822   PROTEINUR Positive (A) 05/19/2019 0947   PROTEINUR TRACE (A) 06/01/2011 1822   UROBILINOGEN 0.2 05/19/2019 0947   UROBILINOGEN 0.2 06/01/2011 1822   NITRITE neg 05/19/2019 0947   NITRITE NEGATIVE 06/01/2011 1822   LEUKOCYTESUR Negative 05/19/2019 0947    Lab Results  Component Value Date   BACTERIA RARE 06/01/2011    Pertinent Imaging: I reviewed the MRI and PET images and discussed the images with the patient and duaghter No results found for this or any previous visit. No results found for this or any previous visit. No results found for this or any previous visit. No results found for this or any previous visit. Results for orders placed during the hospital encounter of 10/15/18  US RENAL   Narrative CLINICAL DATA:  Chronic kidney disease  EXAM: RENAL / URINARY TRACT ULTRASOUND COMPLETE  COMPARISON:  CT dated April 24, 2018.  FINDINGS: Right Kidney:  Renal measurements: 7.3 x 3.4 x 4.4 cm = volume: 57.9 mL. There is a solid mass in the interpolar region measuring 3.2 x 3.6 x 3.2 cm. There is no hydronephrosis. No shadowing echogenic  kidney stones.  Left Kidney:  Renal measurements: 10.1 x 3.9 x 5 cm = volume: 103 mL. Echogenicity within normal limits. No mass or hydronephrosis visualized.  Bladder:  Bladder was underdistended which severely limits evaluation.  IMPRESSION: 1. Solid right renal mass measuring approximately 3.6 cm. This is concerning for renal cell carcinoma until proven otherwise. Urologic follow-up is recommended. 2. No hydronephrosis. 3. Underdistended urinary bladder which limits evaluation.   Electronically Signed   By: Constance Holster M.D.   On: 10/15/2018 19:23    No results found for this or any previous visit. No results found for this or any previous visit. No results found for this or any previous visit.  Assessment & Plan:    1. Right renal mass -We discussed  the natural hx of renal masses and the 80/20% malignant/benign nature of these masses. We discussed surveillance, ablation and partial/radical nephrectomy and patient is likely going to proceed with ablation.  - POCT urinalysis dipstick   No follow-ups on file.  Nicolette Bang, MD  [facility] [place]

## 2019-05-19 NOTE — Patient Instructions (Signed)
Renal Mass  A renal mass is a growth in the kidney. A renal mass may be found while performing an MRI, CT scan, or ultrasound for other problems of the abdomen. Certain types of cancers, infections, or injuries can cause a renal mass. A renal mass that is cancerous (malignant) may grow or spread quickly. Others are harmless (benign). What are common types of renal masses? Renal masses include:  Tumors. These may be cancerous (malignant) or noncancerous (benign). ? The most common type of kidney cancer is renal cell carcinoma. ? The most common benign tumors of the kidney include renal adenomas, oncocytomas, and angiomyolipoma (AML).  Cysts. These are fluid-filled sacs that form on or in the kidney. ? It is not always known what causes a cyst to develop in or on the kidney. ? Most kidney cysts do not cause symptoms and do not need to be treated. What type of testing might I need? Your health care provider may recommend that you have tests to diagnose the cause of your renal mass. The following tests may be done if a renal mass is found:  Physical exam.  Blood tests.  Urine tests.  Imaging tests, such as ultrasound, CT scan, or MRI.  Biopsy. This is a small sample that is removed from the renal mass and tested in a lab. The exact tests and how often they are done will depend on:  The size and appearance of the renal mass.  Risk factors or medical conditions that increase your risk for problems.  Any symptoms associated with the renal mass, or concerns that you have about it. Tests and physical exams may be done once, or they may be done regularly for a period of time. Tests and exams that are done regularly will help monitor whether the mass is growing and beginning to cause problems. What are common treatments for renal masses? Treatment is not always needed for this condition. Your health care provider may recommend careful monitoring (watchful waiting) and regular tests and exams.  Treatment will depend on the cause of the mass. Follow these instructions at home: What you need to do at home will depend on the cause of the mass. Follow the instructions that your health care provider gives to you. In general:  Take over-the-counter and prescription medicines only as told by your health care provider.  If you are prescribed an antibiotic medicine, take it as told by your health care provider. Do not stop taking the antibiotic even if you start to feel better.  Follow any restrictions that are given to you by your health care provider.  Keep all follow-up visits as told by your health care provider. This is important. ? You may need to see your health care provider once or twice a year to have CT scans and ultrasounds done. These tests will show if your renal mass has changed or grown bigger. Contact a health care provider if you:  Have pain in the side or back (flank pain).  Have a fever.  Feel full soon after eating.  Have pain or swelling in the abdomen.  Lose weight. Get help right away if:  Your pain gets worse.  There is blood in your urine.  You cannot urinate.  You have chest pain.  You have trouble breathing. Summary  A renal mass is a growth in the kidney. It may be cancerous (malignant) and grow or spread quickly, or it may be harmless (benign).  Renal masses may be found while performing   an MRI, CT scan, or ultrasound for other problems of the abdomen.  Your health care provider may recommend that you have tests to diagnose the cause of your renal mass. This may include a physical exam, blood tests, urine tests, imaging, or a biopsy.  Treatment is not always needed for this condition. Careful monitoring (watchful waiting) may be recommended. This information is not intended to replace advice given to you by your health care provider. Make sure you discuss any questions you have with your health care provider. Document Revised: 05/01/2017  Document Reviewed: 05/01/2017 Elsevier Patient Education  2020 Elsevier Inc.  

## 2019-05-19 NOTE — Progress Notes (Signed)
Urological Symptom Review  Patient is experiencing the following symptoms: none   Review of Systems  Gastrointestinal (upper)  : Negative for upper GI symptoms  Gastrointestinal (lower) : Negative for lower GI symptoms  Constitutional : Weight loss  Skin: Negative for skin symptoms  Eyes: Blurred vision  Ear/Nose/Throat : Negative for Ear/Nose/Throat symptoms  Hematologic/Lymphatic: Negative for Hematologic/Lymphatic symptoms  Cardiovascular : Negative for cardiovascular symptoms  Respiratory : Negative for respiratory symptoms  Endocrine: Negative for endocrine symptoms  Musculoskeletal: Negative for musculoskeletal symptoms  Neurological: Negative for neurological symptoms  Psychologic: Negative for psychiatric symptoms

## 2019-05-31 ENCOUNTER — Telehealth: Payer: Self-pay | Admitting: Student

## 2019-05-31 NOTE — Telephone Encounter (Signed)
Erin Jordan needs to verify what medications the patient should be taking

## 2019-06-01 NOTE — Telephone Encounter (Signed)
[id number] 

## 2019-06-07 NOTE — Telephone Encounter (Signed)
Called pt. No answer. Left message for pt to return call if she still needed help with medications.

## 2019-06-08 ENCOUNTER — Inpatient Hospital Stay (HOSPITAL_COMMUNITY): Payer: Medicare HMO | Attending: Hematology | Admitting: Hematology

## 2019-06-08 ENCOUNTER — Other Ambulatory Visit: Payer: Self-pay

## 2019-06-08 ENCOUNTER — Encounter (HOSPITAL_COMMUNITY): Payer: Self-pay | Admitting: Hematology

## 2019-06-08 DIAGNOSIS — N2889 Other specified disorders of kidney and ureter: Secondary | ICD-10-CM | POA: Diagnosis not present

## 2019-06-08 NOTE — Progress Notes (Signed)
Virtual Visit via Telephone Note  I connected with Erin Jordan on 06/08/19 at 11:30 AM EST by telephone and verified that I am speaking with the correct person using two identifiers.   I discussed the limitations, risks, security and privacy concerns of performing an evaluation and management service by telephone and the availability of in person appointments. I also discussed with the patient that there may be a patient responsible charge related to this service. The patient expressed understanding and agreed to proceed.   History of Present Illness: Erin Jordan was seen in our clinic for work-up of right kidney mass.  MRI of the abdomen without contrast on 12/12/2018 was suspicious for renal cell cancer.  MRI also showed indeterminate mass in the spleen measuring 4 cm.  PET scan on 03/03/2019 showed 3.6 cm soft tissue density, partially exophytic in the right renal cortical area with SUV 3.4.  Known solitary 4 cm splenic mass is benign.   Observations/Objective: Erin Jordan denies any fevers, night sweats or weight loss in the last 3 months.  Denies any hematuria.  No new onset pains.  Appetite is 50%.  Energy levels are 75%.  Erin Jordan was evaluated by Dr. Alyson Ingles on 05/19/2019.  Assessment and Plan:  1.  Right kidney mass: -Erin Jordan was seen by Dr. Alyson Ingles on 05/19/2019 and various options including surgical resection, ablation, surveillance were discussed. -Erin Jordan has a follow-up appointment with him on 06/30/2019 for further discussion regarding ablation. -I will see her back in 3 months for follow-up.  Based on the procedure, we will talk about surveillance imaging at that time.   Follow Up Instructions: RTC 3 months.   I discussed the assessment and treatment plan with the patient. The patient was provided an opportunity to ask questions and all were answered. The patient agreed with the plan and demonstrated an understanding of the instructions.   The patient was advised to call back or seek an in-person  evaluation if the symptoms worsen or if the condition fails to improve as anticipated.  I provided 8 minutes of non-face-to-face time during this encounter.   Derek Jack, MD

## 2019-06-16 ENCOUNTER — Telehealth: Payer: Self-pay | Admitting: *Deleted

## 2019-06-16 NOTE — Telephone Encounter (Signed)
Pt wants to follow up on paperwork. Please call. Thanks!!

## 2019-06-24 ENCOUNTER — Telehealth: Payer: Self-pay

## 2019-06-24 MED ORDER — METOPROLOL SUCCINATE ER 25 MG PO TB24: 25.0000 mg | ORAL_TABLET | Freq: Every day | ORAL | 3 refills | Status: DC

## 2019-06-24 NOTE — Telephone Encounter (Signed)
Sent refill in for torpol xl.

## 2019-06-24 NOTE — Telephone Encounter (Signed)
Pt's family member(Janet) would like to discuss medications  Please call [phone]  Thanks renee

## 2019-06-28 ENCOUNTER — Other Ambulatory Visit: Payer: Self-pay | Admitting: Family Medicine

## 2019-06-28 DIAGNOSIS — I10 Essential (primary) hypertension: Secondary | ICD-10-CM

## 2019-06-28 DIAGNOSIS — E785 Hyperlipidemia, unspecified: Secondary | ICD-10-CM

## 2019-06-30 ENCOUNTER — Ambulatory Visit: Payer: Medicare HMO | Admitting: Urology

## 2019-06-30 ENCOUNTER — Encounter: Payer: Self-pay | Admitting: Urology

## 2019-06-30 ENCOUNTER — Other Ambulatory Visit: Payer: Self-pay

## 2019-06-30 VITALS — BP 175/88 | HR 81 | Temp 98.4°F

## 2019-06-30 DIAGNOSIS — N2889 Other specified disorders of kidney and ureter: Secondary | ICD-10-CM

## 2019-06-30 NOTE — Progress Notes (Signed)

## 2019-06-30 NOTE — Patient Instructions (Signed)
Renal Mass  A renal mass is a growth in the kidney. A renal mass may be found while performing an MRI, CT scan, or ultrasound for other problems of the abdomen. Certain types of cancers, infections, or injuries can cause a renal mass. A renal mass that is cancerous (malignant) may grow or spread quickly. Others are harmless (benign). What are common types of renal masses? Renal masses include:  Tumors. These may be cancerous (malignant) or noncancerous (benign). ? The most common type of kidney cancer is renal cell carcinoma. ? The most common benign tumors of the kidney include renal adenomas, oncocytomas, and angiomyolipoma (AML).  Cysts. These are fluid-filled sacs that form on or in the kidney. ? It is not always known what causes a cyst to develop in or on the kidney. ? Most kidney cysts do not cause symptoms and do not need to be treated. What type of testing might I need? Your health care provider may recommend that you have tests to diagnose the cause of your renal mass. The following tests may be done if a renal mass is found:  Physical exam.  Blood tests.  Urine tests.  Imaging tests, such as ultrasound, CT scan, or MRI.  Biopsy. This is a small sample that is removed from the renal mass and tested in a lab. The exact tests and how often they are done will depend on:  The size and appearance of the renal mass.  Risk factors or medical conditions that increase your risk for problems.  Any symptoms associated with the renal mass, or concerns that you have about it. Tests and physical exams may be done once, or they may be done regularly for a period of time. Tests and exams that are done regularly will help monitor whether the mass is growing and beginning to cause problems. What are common treatments for renal masses? Treatment is not always needed for this condition. Your health care provider may recommend careful monitoring (watchful waiting) and regular tests and exams.  Treatment will depend on the cause of the mass. Follow these instructions at home: What you need to do at home will depend on the cause of the mass. Follow the instructions that your health care provider gives to you. In general:  Take over-the-counter and prescription medicines only as told by your health care provider.  If you are prescribed an antibiotic medicine, take it as told by your health care provider. Do not stop taking the antibiotic even if you start to feel better.  Follow any restrictions that are given to you by your health care provider.  Keep all follow-up visits as told by your health care provider. This is important. ? You may need to see your health care provider once or twice a year to have CT scans and ultrasounds done. These tests will show if your renal mass has changed or grown bigger. Contact a health care provider if you:  Have pain in the side or back (flank pain).  Have a fever.  Feel full soon after eating.  Have pain or swelling in the abdomen.  Lose weight. Get help right away if:  Your pain gets worse.  There is blood in your urine.  You cannot urinate.  You have chest pain.  You have trouble breathing. Summary  A renal mass is a growth in the kidney. It may be cancerous (malignant) and grow or spread quickly, or it may be harmless (benign).  Renal masses may be found while performing   an MRI, CT scan, or ultrasound for other problems of the abdomen.  Your health care provider may recommend that you have tests to diagnose the cause of your renal mass. This may include a physical exam, blood tests, urine tests, imaging, or a biopsy.  Treatment is not always needed for this condition. Careful monitoring (watchful waiting) may be recommended. This information is not intended to replace advice given to you by your health care provider. Make sure you discuss any questions you have with your health care provider. Document Revised: 05/01/2017  Document Reviewed: 05/01/2017 Elsevier Patient Education  2020 Elsevier Inc.  

## 2019-06-30 NOTE — Progress Notes (Signed)
06/30/2019 2:19 PM   [name] 04/16/1945 [id number]  Referring provider: [name], [place],  [place] [address]  Right renal mass  HPI: Ms Erin Jordan is a 74yo here for followup for a right renal mass. She met with her oncologist and discussed the treatment options with here son and the wish to proceed with ablation.    PMH: Past Medical History:  Diagnosis Date  . Arthritis   . Cancer of kidney ([place])   . Chronic kidney disease, stage 3, mod decreased GFR   . Coronary atherosclerosis of native coronary artery 2006   Multivessel status post CABG in [place], graft disease documented March 2019 with DES to SVG to diagonal  . Essential hypertension, benign   . 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 High cholesterol   . History of diabetes mellitus, type II   . Hyperlipidemia   . NSTEMI (non-ST elevated myocardial infarction) ([place]) 06/24/2017  . Right renal mass     Surgical History: Past Surgical History:  Procedure Laterality Date  . ABDOMINAL HYSTERECTOMY    . CORONARY ARTERY BYPASS GRAFT  2006   [place], [place]  . CORONARY STENT INTERVENTION N/A 06/25/2017   Procedure: CORONARY STENT INTERVENTION;  Surgeon: Jettie Booze, MD;  Location: [facility];  Service: Cardiovascular;  Laterality: N/A;  . LEFT HEART CATH AND CORS/GRAFTS ANGIOGRAPHY N/A 06/25/2017   Procedure: LEFT HEART CATH AND CORS/GRAFTS ANGIOGRAPHY;  Surgeon: Jettie Booze, MD;  Location: [facility];  Service: Cardiovascular;  Laterality: N/A;    Home Medications:  Allergies as of 06/30/2019   No Known Allergies     Medication List       Accurate as of June 30, 2019  2:19 PM. If you have any questions, ask your nurse or doctor.        amLODipine 5 MG tablet Commonly known as: NORVASC TAKE ONE TABLET BY MOUTH ONCE DAILY.   aspirin EC 81 MG tablet Take 1 tablet (81 mg total) by mouth daily.   atorvastatin 80 MG  tablet Commonly known as: LIPITOR TAKE 1 TABLET BY MOUTH ONCE DAILY.   calcium carbonate 1500 (600 Ca) MG Tabs tablet Commonly known as: OSCAL Take 1,500 mg by mouth daily with breakfast.   cholecalciferol 25 MCG (1000 UNIT) tablet Commonly known as: VITAMIN D Take 1,000 Units by mouth daily.   donepezil 10 MG tablet Commonly known as: Aricept Take 1 tablet (10 mg total) by mouth at bedtime.   methimazole 5 MG tablet Commonly known as: TAPAZOLE Take 1 tablet (5 mg total) by mouth daily.   metoprolol succinate 25 MG 24 hr tablet Commonly known as: Toprol XL Take 1 tablet (25 mg total) by mouth daily.   nitroGLYCERIN 0.4 MG SL tablet Commonly known as: NITROSTAT Place 1 tablet (0.4 mg total) under the tongue every 5 (five) minutes x 3 doses as needed for chest pain.       Allergies: No Known Allergies  Family History: Family History  Problem Relation Age of Onset  . Heart attack Mother   . Hypertension Mother   . Seizures Son   . Seizures Son   . Seizures Daughter     Social History:  reports that she has never smoked. She has never used smokeless tobacco. She reports that she does not drink alcohol or use drugs.  ROS: All other review of systems were reviewed and are negative except what is noted above  in HPI  Physical Exam: BP (!) 175/88   Pulse 81   Temp 98.4 F (36.9 C)   Constitutional:  Alert and oriented, No acute distress. HEENT: [place] AT, moist mucus membranes.  Trachea midline, no masses. Cardiovascular: No clubbing, cyanosis, or edema. Respiratory: Normal respiratory effort, no increased work of breathing. GI: Abdomen is soft, nontender, nondistended, no abdominal masses GU: No CVA tenderness Lymph: No cervical or inguinal lymphadenopathy. Skin: No rashes, bruises or suspicious lesions. Neurologic: Grossly intact, no focal deficits, moving all 4 extremities. Psychiatric: Normal mood and affect.  Laboratory Data: Lab Results  Component Value Date    WBC 5.7 01/29/2019   HGB 13.3 01/29/2019   HCT 40.6 01/29/2019   MCV 94.2 01/29/2019   PLT 198 01/29/2019    Lab Results  Component Value Date   CREATININE 1.61 (H) 01/29/2019    No results found for: PSA  No results found for: TESTOSTERONE  Lab Results  Component Value Date   HGBA1C 5.9 01/25/2019    Urinalysis    Component Value Date/Time   COLORURINE YELLOW 06/01/2011 1822   APPEARANCEUR CLEAR 06/01/2011 1822   LABSPEC <1.005 (L) 06/01/2011 1822   PHURINE 6.0 06/01/2011 1822   GLUCOSEU NEGATIVE 06/01/2011 1822   HGBUR TRACE (A) 06/01/2011 1822   BILIRUBINUR neg 05/19/2019 0947   KETONESUR NEGATIVE 06/01/2011 1822   PROTEINUR Positive (A) 05/19/2019 0947   PROTEINUR TRACE (A) 06/01/2011 1822   UROBILINOGEN 0.2 05/19/2019 0947   UROBILINOGEN 0.2 06/01/2011 1822   NITRITE neg 05/19/2019 0947   NITRITE NEGATIVE 06/01/2011 1822   LEUKOCYTESUR Negative 05/19/2019 0947    Lab Results  Component Value Date   BACTERIA RARE 06/01/2011    Pertinent Imaging:  No results found for this or any previous visit. No results found for this or any previous visit. No results found for this or any previous visit. No results found for this or any previous visit. Results for orders placed during the hospital encounter of 10/15/18  US RENAL   Narrative CLINICAL DATA:  Chronic kidney disease  EXAM: RENAL / URINARY TRACT ULTRASOUND COMPLETE  COMPARISON:  CT dated April 24, 2018.  FINDINGS: Right Kidney:  Renal measurements: 7.3 x 3.4 x 4.4 cm = volume: 57.9 mL. There is a solid mass in the interpolar region measuring 3.2 x 3.6 x 3.2 cm. There is no hydronephrosis. No shadowing echogenic kidney stones.  Left Kidney:  Renal measurements: 10.1 x 3.9 x 5 cm = volume: 103 mL. Echogenicity within normal limits. No mass or hydronephrosis visualized.  Bladder:  Bladder was underdistended which severely limits evaluation.  IMPRESSION: 1. Solid right renal mass  measuring approximately 3.6 cm. This is concerning for renal cell carcinoma until proven otherwise. Urologic follow-up is recommended. 2. No hydronephrosis. 3. Underdistended urinary bladder which limits evaluation.   Electronically Signed   By: Constance Holster M.D.   On: 10/15/2018 19:23    No results found for this or any previous visit. No results found for this or any previous visit. No results found for this or any previous visit.  Assessment & Plan:    1. Right renal mass -referral to IR for right renal mass ablation   No follow-ups on file.  Nicolette Bang, MD  [facility] [place]

## 2019-07-07 ENCOUNTER — Other Ambulatory Visit: Payer: Self-pay

## 2019-07-07 ENCOUNTER — Encounter: Payer: Self-pay | Admitting: Cardiology

## 2019-07-07 ENCOUNTER — Ambulatory Visit (INDEPENDENT_AMBULATORY_CARE_PROVIDER_SITE_OTHER): Payer: Medicare HMO | Admitting: Cardiology

## 2019-07-07 VITALS — BP 140/70 | HR 80 | Ht 62.0 in | Wt 116.0 lb

## 2019-07-07 DIAGNOSIS — I25119 Atherosclerotic heart disease of native coronary artery with unspecified angina pectoris: Secondary | ICD-10-CM | POA: Diagnosis not present

## 2019-07-07 DIAGNOSIS — E782 Mixed hyperlipidemia: Secondary | ICD-10-CM | POA: Diagnosis not present

## 2019-07-07 DIAGNOSIS — I10 Essential (primary) hypertension: Secondary | ICD-10-CM | POA: Diagnosis not present

## 2019-07-07 MED ORDER — NITROGLYCERIN 0.4 MG SL SUBL: 0.4000 mg | SUBLINGUAL_TABLET | SUBLINGUAL | 3 refills | Status: DC | PRN

## 2019-07-07 NOTE — Patient Instructions (Signed)
Medication Instructions:  °Your physician recommends that you continue on your current medications as directed. Please refer to the Current Medication list given to you today. ° °*If you need a refill on your cardiac medications before your next appointment, please call your pharmacy* ° ° °Lab Work: °None today °If you have labs (blood work) drawn today and your tests are completely normal, you will receive your results only by: °• MyChart Message (if you have MyChart) OR °• A paper copy in the mail °If you have any lab test that is abnormal or we need to change your treatment, we will call you to review the results. ° ° °Testing/Procedures: °None today ° ° °Follow-Up: °At [facility], you and your health needs are our priority.  As part of our continuing mission to provide you with exceptional heart care, we have created designated Provider Care Teams.  These Care Teams include your primary Cardiologist (physician) and Advanced Practice Providers (APPs -  Physician Assistants and Nurse Practitioners) who all work together to provide you with the care you need, when you need it. ° °We recommend signing up for the patient portal called "MyChart".  Sign up information is provided on this After Visit Summary.  MyChart is used to connect with patients for Virtual Visits (Telemedicine).  Patients are able to view lab/test results, encounter notes, upcoming appointments, etc.  Non-urgent messages can be sent to your provider as well.   °To learn more about what you can do with MyChart, go to [email or web address].   ° °Your next appointment:   °6 month(s) ° °The format for your next appointment:   °In Person ° °Provider:   °Samuel McDowell, MD ° ° °Other Instructions °None ° ° ° ° °Thank you for choosing [facility] ! ° ° ° ° ° ° ° ° °

## 2019-07-07 NOTE — Progress Notes (Signed)
Cardiology Office Note  Date: 07/07/2019   ID: AMIRI TRITCH, DOB 07-29-1945, MRN [id number]  PCP:  [name], FNP  Cardiologist:  Rozann Lesches, MD Electrophysiologist:  None   Chief Complaint  Patient presents with  . Cardiac follow-up    History of Present Illness: Erin Jordan is a 74 y.o. female last assessed via telehealth encounter in May 2020 by Ms. Strader PA-C.  She is here today with her granddaughter for a follow-up visit.  She does not report any active angina or nitroglycerin use.  Currently undergoing evaluation for a right renal mass by Dr. Alyson Ingles.  Plan is to undergo ablation surgery sometime in June.  I reviewed her medications which are outlined below.  Cardiac regimen looks good overall, her blood pressure is somewhat better following up titration of antihypertensive therapy by her PCP.  I personally reviewed her ECG today which shows normal sinus rhythm.  Past Medical History:  Diagnosis Date  . Arthritis   . Cancer of kidney ([place])   . Chronic kidney disease, stage 3, mod decreased GFR   . Coronary atherosclerosis of native coronary artery 2006   Multivessel status post CABG in [place], graft disease documented March 2019 with DES to SVG to diagonal  . Essential hypertension   . 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 History of diabetes mellitus, type II   . Hyperlipidemia   . NSTEMI (non-ST elevated myocardial infarction) ([place]) 06/24/2017  . Right renal mass     Past Surgical History:  Procedure Laterality Date  . ABDOMINAL HYSTERECTOMY    . CORONARY ARTERY BYPASS GRAFT  2006   [place], [place]  . CORONARY STENT INTERVENTION N/A 06/25/2017   Procedure: CORONARY STENT INTERVENTION;  Surgeon: Jettie Booze, MD;  Location: [facility];  Service: Cardiovascular;  Laterality: N/A;  . LEFT HEART CATH AND CORS/GRAFTS ANGIOGRAPHY N/A 06/25/2017   Procedure: LEFT HEART CATH AND CORS/GRAFTS ANGIOGRAPHY;  Surgeon:  Jettie Booze, MD;  Location: [facility];  Service: Cardiovascular;  Laterality: N/A;    Current Outpatient Medications  Medication Sig Dispense Refill  . amLODipine (NORVASC) 5 MG tablet TAKE ONE TABLET BY MOUTH ONCE DAILY. 30 tablet 0  . aspirin EC 81 MG tablet Take 1 tablet (81 mg total) by mouth daily. 90 tablet 3  . atorvastatin (LIPITOR) 80 MG tablet TAKE 1 TABLET BY MOUTH ONCE DAILY. 90 tablet 0  . calcium carbonate (OSCAL) 1500 (600 Ca) MG TABS tablet Take 1,500 mg by mouth daily with breakfast.     . cholecalciferol (VITAMIN D) 25 MCG (1000 UT) tablet Take 1,000 Units by mouth daily.     Marland Kitchen donepezil (ARICEPT) 10 MG tablet Take 1 tablet (10 mg total) by mouth at bedtime. 30 tablet 2  . methimazole (TAPAZOLE) 5 MG tablet Take 1 tablet (5 mg total) by mouth daily. 30 tablet 3  . metoprolol succinate (TOPROL XL) 25 MG 24 hr tablet Take 1 tablet (25 mg total) by mouth daily. 90 tablet 3  . nitroGLYCERIN (NITROSTAT) 0.4 MG SL tablet Place 1 tablet (0.4 mg total) under the tongue every 5 (five) minutes x 3 doses as needed for chest pain. 25 tablet 3   No current facility-administered medications for this visit.   Allergies:  Patient has no known allergies.   ROS:  Weight loss.  Physical Exam: VS:  BP 140/70   Pulse 80   Ht 5\' 2"  (1.575 m)   Wt 116  lb (52.6 kg)   SpO2 99%   BMI 21.22 kg/m , BMI Body mass index is 21.22 kg/m.  Wt Readings from Last 3 Encounters:  07/07/19 116 lb (52.6 kg)  05/19/19 127 lb (57.6 kg)  03/24/19 121 lb 3.2 oz (55 kg)    General: Elderly woman, appears comfortable at rest. HEENT: Conjunctiva and lids normal, wearing a mask. Neck: Supple, no elevated JVP or carotid bruits, no thyromegaly. Lungs: Clear to auscultation, nonlabored breathing at rest. Cardiac: Regular rate and rhythm, no S, 2/6 systolic murmur, no pericardial rub. Abdomen: Soft, nontender, bowel sounds present. Extremities: No pitting edema, distal pulses 2+.   ECG:   An ECG dated 04/24/2018 was personally reviewed today and demonstrated:  Sinus rhythm with PVC.  Recent Labwork: 01/29/2019: ALT 16; AST 17; BUN 38; Creatinine, Ser 1.61; Hemoglobin 13.3; Platelets 198; Potassium 4.1; Sodium 142 03/24/2019: TSH 0.709     Component Value Date/Time   CHOL 268 (H) 03/24/2019 1612   TRIG 108 03/24/2019 1612   HDL 101 03/24/2019 1612   CHOLHDL 2.7 03/24/2019 1612   CHOLHDL 4.4 06/02/2011 0431   VLDL 18 06/02/2011 0431   LDLCALC 149 (H) 03/24/2019 1612    Other Studies Reviewed Today:  Cardiac catheterization and PCI 06/25/2017:  Ost 1st Diag to 1st Diag lesion is 80% stenosed. THis is a long lesion nad is not grafted.  Prox LAD to Mid LAD lesion is 100% stenosed. LIMA to LAD is patent.  Lat 1st Mrg lesion is 95% stenosed.  Ost 1st Mrg to 1st Mrg lesion is 95% stenosed. SVG to OM is occluded.  Prox RCA lesion is 100% stenosed. SVG to PDA is occluded. This appears recent.  Ost 2nd Diag to 2nd Diag lesion is 100% stenosed. SVG to Diagonal with ostial 75% lesion.  A drug-eluting stent was successfully placed using a STENT SIERRA 4.00 X 15 MM.  Post intervention, there is a 0% residual stenosis.  The left ventricular ejection fraction is 50-55% by visual estimate.  There is no aortic valve stenosis.  Continue aggressive medical therapy. Complex circumflex bifurcation lesion.Increase antianginals. Would likely lose the large lateral branch of the OM1 if PCI were performed.  Assessment and Plan:  1.  Multivessel CAD status post CABG.  Cardiac catheterization in March 2019 revealed patent LIMA to LAD, occluded SVG to OM and SVG to PDA with 75% stenosis involving the SVG to D1 treated with DES intervention.  She is clinically stable without progressive angina on medical therapy.  ECG reviewed and stable.  Continue aspirin, Norvasc, Lipitor, Toprol-XL, and as needed nitroglycerin.  2.  Right renal mass with plan for ablation surgery per Dr. Alyson Ingles  in June.  Unless her cardiac status changes, do not anticipate any further preoperative cardiac testing at this point.  3.  Essential hypertension, blood pressure trend better following medication adjustments.  Continue current doses of Norvasc and Toprol-XL.  4.  Mixed hyperlipidemia, continue Lipitor at high dose.  Medication Adjustments/Labs and Tests Ordered: Current medicines are reviewed at length with the patient today.  Concerns regarding medicines are outlined above.   Tests Ordered: Orders Placed This Encounter  Procedures  . EKG 12-Lead    Medication Changes: Meds ordered this encounter  Medications  . nitroGLYCERIN (NITROSTAT) 0.4 MG SL tablet    Sig: Place 1 tablet (0.4 mg total) under the tongue every 5 (five) minutes x 3 doses as needed for chest pain.    Dispense:  25 tablet    Refill:  3    Disposition:  Follow up 6 months in the [place] office.  Signed, Satira Sark, MD, [facility] 07/07/2019 5:05 PM    [facility] at [facility] [address], [place], [place] [address] Phone: [phone]; Fax: [phone]

## 2019-07-08 ENCOUNTER — Encounter: Payer: Self-pay | Admitting: *Deleted

## 2019-07-08 ENCOUNTER — Ambulatory Visit
Admission: RE | Admit: 2019-07-08 | Discharge: 2019-07-08 | Disposition: A | Discharge status: Home / Self Care | Payer: Medicare HMO | Source: Ambulatory Visit | Attending: Urology | Admitting: Urology

## 2019-07-08 ENCOUNTER — Other Ambulatory Visit: Payer: Self-pay | Admitting: Diagnostic Radiology

## 2019-07-08 DIAGNOSIS — N2889 Other specified disorders of kidney and ureter: Secondary | ICD-10-CM

## 2019-07-08 HISTORY — PX: IR RADIOLOGIST EVAL & MGMT: IMG5224

## 2019-07-08 NOTE — Consult Note (Signed)
Chief Complaint: Patient was consulted remotely today (TeleHealth) renal ablation evaluation at the request of Toston L.    Referring Physician(s): McKenzie,Patrick L  History of Present Illness: Erin Jordan is a 74 y.o. female with a suspicious solid right renal mass.  Right renal mass was initially identified on an abdominal CT from 04/24/2018.  Follow-up renal ultrasound on 10/15/2018 demonstrated a solid right renal mass measuring up to 3.6 cm.  MRI without contrast was performed on 12/12/2018 and a PET/CT was performed on 03/03/2019.  Patient has not been able to have postcontrast imaging due to chronic kidney disease.  Speaking to the patient and her granddaughter on the phone today, the patient's only complaint is persistent unintentional weight loss.  She denies hematuria or flank pain.  She recently saw cardiology and no new issues with her cardiac disease.  Patient is taking aspirin but she is not on anticoagulation.  Patient has discussed the right renal lesion with urology.  After consultation with urology, the patient has elected for percutaneous ablation rather than surgical resection.   Past Medical History:  Diagnosis Date  . Arthritis   . Cancer of kidney ([place])   . Chronic kidney disease, stage 3, mod decreased GFR   . Coronary atherosclerosis of native coronary artery 2006   Multivessel status post CABG in [place], graft disease documented March 2019 with DES to SVG to diagonal  . Essential hypertension   . 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 History of diabetes mellitus, type II   . Hyperlipidemia   . NSTEMI (non-ST elevated myocardial infarction) ([place]) 06/24/2017  . Right renal mass     Past Surgical History:  Procedure Laterality Date  . ABDOMINAL HYSTERECTOMY    . CORONARY ARTERY BYPASS GRAFT  2006   [place], [place]  . CORONARY STENT INTERVENTION N/A 06/25/2017   Procedure: CORONARY STENT INTERVENTION;  Surgeon: Jettie Booze, MD;  Location: [facility];  Service: Cardiovascular;  Laterality: N/A;  . IR RADIOLOGIST EVAL & MGMT  07/08/2019  . LEFT HEART CATH AND CORS/GRAFTS ANGIOGRAPHY N/A 06/25/2017   Procedure: LEFT HEART CATH AND CORS/GRAFTS ANGIOGRAPHY;  Surgeon: Jettie Booze, MD;  Location: [facility];  Service: Cardiovascular;  Laterality: N/A;    Allergies: Patient has no known allergies.  Medications: Prior to Admission medications   Medication Sig Start Date End Date Taking? Authorizing Provider  amLODipine (NORVASC) 5 MG tablet TAKE ONE TABLET BY MOUTH ONCE DAILY. 06/28/19   [name], FNP  aspirin EC 81 MG tablet Take 1 tablet (81 mg total) by mouth daily. 07/14/17   Imogene Burn, PA-C  atorvastatin (LIPITOR) 80 MG tablet TAKE 1 TABLET BY MOUTH ONCE DAILY. 06/28/19   [name], FNP  calcium carbonate (OSCAL) 1500 (600 Ca) MG TABS tablet Take 1,500 mg by mouth daily with breakfast.     [provider]  cholecalciferol (VITAMIN D) 25 MCG (1000 UT) tablet Take 1,000 Units by mouth daily.     [provider]  donepezil (ARICEPT) 10 MG tablet Take 1 tablet (10 mg total) by mouth at bedtime. 03/24/19   [name], FNP  methimazole (TAPAZOLE) 5 MG tablet Take 1 tablet (5 mg total) by mouth daily. 01/25/19   [name], FNP  metoprolol succinate (TOPROL XL) 25 MG 24 hr tablet Take 1 tablet (25 mg total) by mouth daily. 06/24/19   Strader, Fransisco Hertz, PA-C  nitroGLYCERIN (NITROSTAT) 0.4 MG  SL tablet Place 1 tablet (0.4 mg total) under the tongue every 5 (five) minutes x 3 doses as needed for chest pain. 07/07/19   Satira Sark, MD     Family History  Problem Relation Age of Onset  . Heart attack Mother   . Hypertension Mother   . Seizures Son   . Seizures Son   . Seizures Daughter     Social History   Socioeconomic History  . Marital status: Widowed    Spouse name: Not on file  . Number of children: 6  . Years of education: Not on  file  . Highest education level: Not on file  Occupational History  . Occupation: retired  Tobacco Use  . Smoking status: Never Smoker  . Smokeless tobacco: Never Used  Substance and Sexual Activity  . Alcohol use: No  . Drug use: No  . Sexual activity: Never  Other Topics Concern  . Not on file  Social History Narrative   Lives in [place] with her daughter and grandchildren.   Social Determinants of Health   Financial Resource Strain: Medium Risk  . Difficulty of Paying Living Expenses: Somewhat hard  Food Insecurity: No Food Insecurity  . Worried About Charity fundraiser in the Last Year: Never true  . Ran Out of Food in the Last Year: Never true  Transportation Needs: No Transportation Needs  . Lack of Transportation (Medical): No  . Lack of Transportation (Non-Medical): No  Physical Activity: Inactive  . Days of Exercise per Week: 0 days  . Minutes of Exercise per Session: 0 min  Stress: No Stress Concern Present  . Feeling of Stress : Only a little  Social Connections: Somewhat Isolated  . Frequency of Communication with Friends and Family: Three times a week  . Frequency of Social Gatherings with Friends and Family: Three times a week  . Attends Religious Services: 1 to 4 times per year  . Active Member of Clubs or Organizations: No  . Attends Archivist Meetings: Never  . Marital Status: Widowed      Review of Systems  Constitutional: Positive for unexpected weight change.  Genitourinary: Negative.  Negative for flank pain and hematuria.    Physical Exam No direct physical exam was performed due to telephone visit  Vital Signs: There were no vitals taken for this visit.  Imaging: CLINICAL DATA:  Initial treatment strategy for indeterminate splenic mass. Additional history of indeterminate right renal mass. History renal insufficiency.  EXAM: NUCLEAR MEDICINE PET SKULL BASE TO THIGH  TECHNIQUE: 6.7 mCi F-18 FDG was injected  intravenously. Full-ring PET imaging was performed from the skull base to thigh after the radiotracer. CT data was obtained and used for attenuation correction and anatomic localization.  Fasting blood glucose: 119 mg/dl  COMPARISON:  12/12/2018 unenhanced MRI abdomen. 04/24/2018 unenhanced CT abdomen/pelvis. 10/15/2018 renal sonogram.  FINDINGS: Mediastinal blood pool activity: SUV max 2.2  Liver activity: SUV max NA  NECK: No hypermetabolic lymph nodes in the neck.  Incidental CT findings: Large goiter with scattered calcifications and no discrete thyroid nodules. No focal thyroid hypermetabolism. Moderate narrowing and slight right deviation of the trachea at the level of the thoracic inlet by the goiter.  CHEST: No enlarged or hypermetabolic axillary, mediastinal or hilar lymph nodes. No hypermetabolic pulmonary findings.  Incidental CT findings: Intact sternotomy wires. Coronary atherosclerosis status post CABG. Atherosclerotic nonaneurysmal thoracic aorta. No acute consolidative airspace disease, lung masses or significant pulmonary nodules.  ABDOMEN/PELVIS:  No  splenic hypermetabolism. Known solitary 4 cm splenic mass is occult on the noncontrast CT images and demonstrates no uptake above the background splenic parenchyma.  Known 3.6 x 3.3 cm soft tissue density partially exophytic interpolar right renal cortical mass (series 2/image 94) appears to demonstrate hypermetabolism with max SUV 3.4.  No abnormal hypermetabolic activity within the liver, pancreas or adrenal glands. No hypermetabolic lymph nodes in the abdomen or pelvis.  Incidental CT findings: Atherosclerotic nonaneurysmal abdominal aorta. Moderate colonic stool.  SKELETON: No focal hypermetabolic activity to suggest skeletal metastasis.  Incidental CT findings: none  IMPRESSION: 1. Known solitary 4 cm splenic mass as seen on 12/12/2018 unenhanced MRI abdomen study is occult on  the noncontrast CT images and demonstrates no uptake above the background splenic parenchyma, and is probably benign. Follow-up noncontrast MRI abdomen was recommended in 6-12 months on the 12/12/2018 MRI abdomen report. 2. Known 3.6 cm soft tissue density partially exophytic interpolar right renal cortical mass appears to demonstrate hypermetabolism, with the caveat that evaluation of renal masses is generally precluded on PET-CT given urinary excretion of radiotracer. Renal cell carcinoma is the diagnosis of exclusion. Percutaneous biopsy of this mass could be considered as clinically warranted. Continued imaging surveillance recommended. 3. No evidence of hypermetabolic metastatic disease. 4. Chronic findings include: Aortic Atherosclerosis (ICD10-I70.0). 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   Electronically Signed   By: Ilona Sorrel M.D.   On: 03/03/2019 16:51  Labs:  CBC: Recent Labs    01/29/19 0941  WBC 5.7  HGB 13.3  HCT 40.6  PLT 198    COAGS: No results for input(s): INR, APTT in the last 8760 hours.  BMP: Recent Labs    01/29/19 0941  NA 142  K 4.1  CL 107  CO2 27  GLUCOSE 125*  BUN 38*  CALCIUM 9.6  CREATININE 1.61*  GFRNONAA 31*  GFRAA 36*    LIVER FUNCTION TESTS: Recent Labs    01/29/19 0941  BILITOT 0.6  AST 17  ALT 16  ALKPHOS 85  PROT 7.1  ALBUMIN 4.2    TUMOR MARKERS: No results for input(s): AFPTM, CEA, CA199, CHROMGRNA in the last 8760 hours.  Assessment and Plan:  74 year old with a solid right renal lesion and a solitary 4 cm splenic lesion.  Splenic lesion is presumed to be benign based on the PET/CT.  Right renal lesion is suspicious for a renal cell carcinoma but indeterminate.  The renal lesion is hyperechoic on ultrasound with evidence of internal hemorrhage on MRI.  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  Unfortunately, the patient has not had  postcontrast imaging due to chronic kidney disease.  I do not have access to recent metabolic panel or renal labs.  In addition, the lesion has not been imaged in the past 4 months.  The right renal mass has slightly enlarged in size between 04/24/2018 and the PET/CT on 03/03/2019.  Discussed renal cryoablation of the right renal lesion with the patient and her granddaughter.  Benefits of the procedure is that it is minimally invasive and usually nephron sparing compared to surgical resection or nephrectomy.  Patient would still require general anesthesia for the procedure.  Downside to the procedure is that it can be difficult to completely treat large lesions.  At this point, the lesion is approximately 3.6 cm and located along the posterior lateral aspect of the right kidney.  Based on the size and location, the lesion should be  amendable for renal cryoablation.  However, it will be difficult to monitor this area after ablation if we are unable to give the patient contrast.  It may be difficult to know if we get a complete treatment or if the patient has local recurrence.  These issues with the renal function and renal mass size were discussed with the patient and family.  As a result, I would like to proceed with a CT-guided biopsy of the right renal mass for 2 reasons.  First, I would like to confirm that this is truly a renal cell carcinoma before committing to a renal ablation.  Second, I would like to re-evaluate the size of the renal lesion.  We will also plan to get baseline renal labs at the time of the biopsy.  Patient and family are comfortable with this plan.  Therefore, we will schedule the patient for CT-guided biopsy with renal labs in the near future.  If the renal lesion is confirmed to be malignant and the size has not significantly changed, we will likely proceed with image guided cryoablation of this lesion.  Thank you for this interesting consult.  I greatly enjoyed meeting Erin Jordan  and look forward to participating in their care.  A copy of this report was sent to the requesting provider on this date.  Electronically Signed: Burman Riis 07/08/2019, 3:40 PM  I spent a total of  20 minutes   in remote  clinical consultation, greater than 50% of which was counseling/coordinating care for a right renal lesion.    Visit type: Audio only (telephone). Audio (no video) only due to Patient preference.. Alternative for in-person consultation at [facility], [address] [place], [place], [place]. This visit type was conducted due to national recommendations for restrictions regarding the COVID-19 Pandemic (e.g. social distancing).  This format is felt to be most appropriate for this patient at this time.  All issues noted in this document were discussed and addressed.

## 2019-07-19 ENCOUNTER — Ambulatory Visit (HOSPITAL_COMMUNITY): Payer: Medicare HMO

## 2019-07-20 ENCOUNTER — Other Ambulatory Visit: Payer: Self-pay | Admitting: Student

## 2019-07-21 ENCOUNTER — Ambulatory Visit (HOSPITAL_COMMUNITY): Admission: RE | Admit: 2019-07-21 | Payer: Medicare HMO | Source: Ambulatory Visit

## 2019-07-26 ENCOUNTER — Other Ambulatory Visit: Payer: Self-pay | Admitting: Radiology

## 2019-07-27 ENCOUNTER — Other Ambulatory Visit: Payer: Self-pay

## 2019-07-27 ENCOUNTER — Encounter (HOSPITAL_COMMUNITY): Payer: Self-pay

## 2019-07-27 ENCOUNTER — Ambulatory Visit (HOSPITAL_COMMUNITY)
Admission: RE | Admit: 2019-07-27 | Discharge: 2019-07-27 | Disposition: A | Discharge status: Home / Self Care | Payer: Medicare HMO | Source: Ambulatory Visit | Attending: Diagnostic Radiology | Admitting: Diagnostic Radiology

## 2019-07-27 DIAGNOSIS — M1990 Unspecified osteoarthritis, unspecified site: Secondary | ICD-10-CM | POA: Diagnosis not present

## 2019-07-27 DIAGNOSIS — E1122 Type 2 diabetes mellitus with diabetic chronic kidney disease: Secondary | ICD-10-CM | POA: Diagnosis not present

## 2019-07-27 DIAGNOSIS — Z951 Presence of aortocoronary bypass graft: Secondary | ICD-10-CM | POA: Diagnosis not present

## 2019-07-27 DIAGNOSIS — I2510 Atherosclerotic heart disease of native coronary artery without angina pectoris: Secondary | ICD-10-CM | POA: Diagnosis not present

## 2019-07-27 DIAGNOSIS — Z7982 Long term (current) use of aspirin: Secondary | ICD-10-CM | POA: Insufficient documentation

## 2019-07-27 DIAGNOSIS — E785 Hyperlipidemia, unspecified: Secondary | ICD-10-CM | POA: Insufficient documentation

## 2019-07-27 DIAGNOSIS — I129 Hypertensive chronic kidney disease with stage 1 through stage 4 chronic kidney disease, or unspecified chronic kidney disease: Secondary | ICD-10-CM | POA: Diagnosis not present

## 2019-07-27 DIAGNOSIS — Z79899 Other long term (current) drug therapy: Secondary | ICD-10-CM | POA: Insufficient documentation

## 2019-07-27 DIAGNOSIS — N183 Chronic kidney disease, stage 3 unspecified: Secondary | ICD-10-CM | POA: Insufficient documentation

## 2019-07-27 DIAGNOSIS — Z85528 Personal history of other malignant neoplasm of kidney: Secondary | ICD-10-CM | POA: Diagnosis not present

## 2019-07-27 DIAGNOSIS — Z7901 Long term (current) use of anticoagulants: Secondary | ICD-10-CM | POA: Diagnosis not present

## 2019-07-27 DIAGNOSIS — I252 Old myocardial infarction: Secondary | ICD-10-CM | POA: Insufficient documentation

## 2019-07-27 DIAGNOSIS — C649 Malignant neoplasm of unspecified kidney, except renal pelvis: Secondary | ICD-10-CM | POA: Insufficient documentation

## 2019-07-27 DIAGNOSIS — Z955 Presence of coronary angioplasty implant and graft: Secondary | ICD-10-CM | POA: Diagnosis not present

## 2019-07-27 DIAGNOSIS — N2889 Other specified disorders of kidney and ureter: Secondary | ICD-10-CM

## 2019-07-27 LAB — BASIC METABOLIC PANEL
Anion gap: 9 (ref 5–15)
BUN: 26 mg/dL — ABNORMAL HIGH (ref 8–23)
CO2: 26 mmol/L (ref 22–32)
Calcium: 9.6 mg/dL (ref 8.9–10.3)
Chloride: 106 mmol/L (ref 98–111)
Creatinine, Ser: 1.6 mg/dL — ABNORMAL HIGH (ref 0.44–1.00)
GFR calc Af Amer: 36 mL/min — ABNORMAL LOW (ref >60)
GFR calc non Af Amer: 31 mL/min — ABNORMAL LOW (ref >60)
Glucose, Bld: 109 mg/dL — ABNORMAL HIGH (ref 70–99)
Potassium: 3.4 mmol/L — ABNORMAL LOW (ref 3.5–5.1)
Sodium: 141 mmol/L (ref 135–145)

## 2019-07-27 LAB — CBC
HCT: 41.1 % (ref 36.0–46.0)
Hemoglobin: 13.9 g/dL (ref 12.0–15.0)
MCH: 31.1 pg (ref 26.0–34.0)
MCHC: 33.8 g/dL (ref 30.0–36.0)
MCV: 91.9 fL (ref 80.0–100.0)
Platelets: 199 10*3/uL (ref 150–400)
RBC: 4.47 MIL/uL (ref 3.87–5.11)
RDW: 12.7 % (ref 11.5–15.5)
WBC: 6.3 10*3/uL (ref 4.0–10.5)
nRBC: 0 % (ref 0.0–0.2)

## 2019-07-27 LAB — PROTIME-INR
INR: 1 (ref 0.8–1.2)
Prothrombin Time: 12.9 seconds (ref 11.4–15.2)

## 2019-07-27 IMAGING — CT CT BIOPSY
1 of 4 series · 13 of 32 positions shown, 19 images · non-contrast
Comparison: none

CLINICAL DATA: Right renal mass

[Series 2: i-spiral 5.0 b40f · axial · 0.68mm/px · z∈[+985,+1097]mm · 13 of 38 slices shown, 19 images]
[im 3/38  soft-tissue]
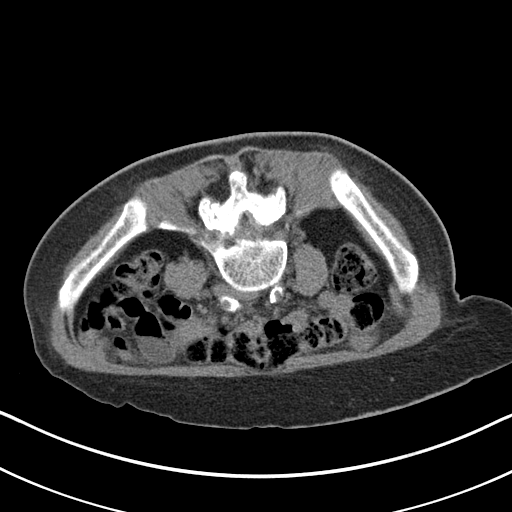
[im 3/38  bone]
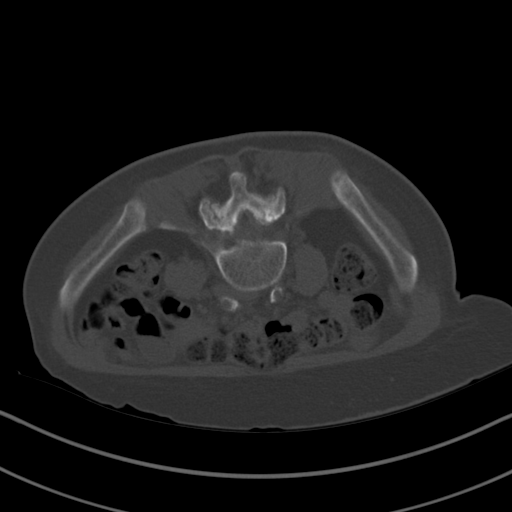
[im 6/38  soft-tissue]
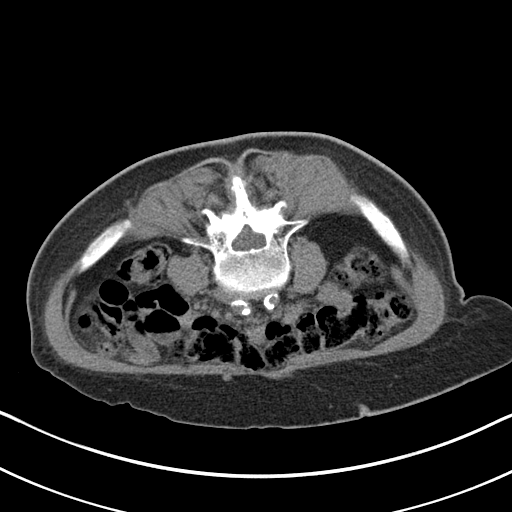
[im 8/38  soft-tissue]
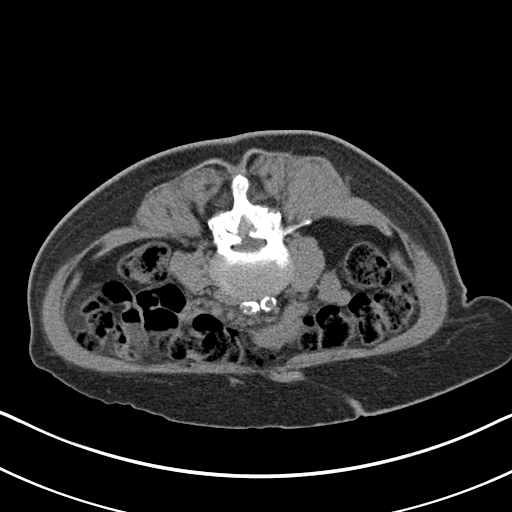
[im 11/38  soft-tissue]
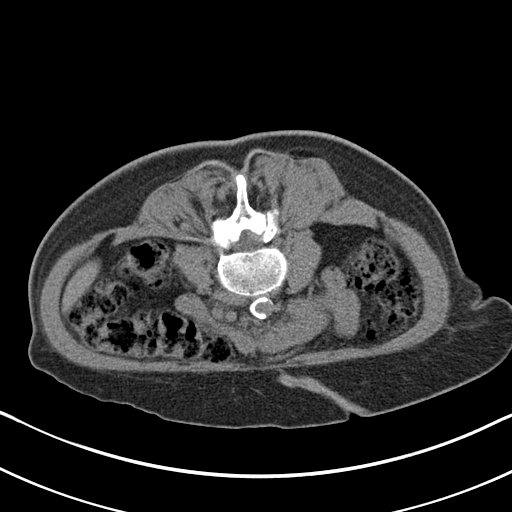
[im 14/38  soft-tissue]
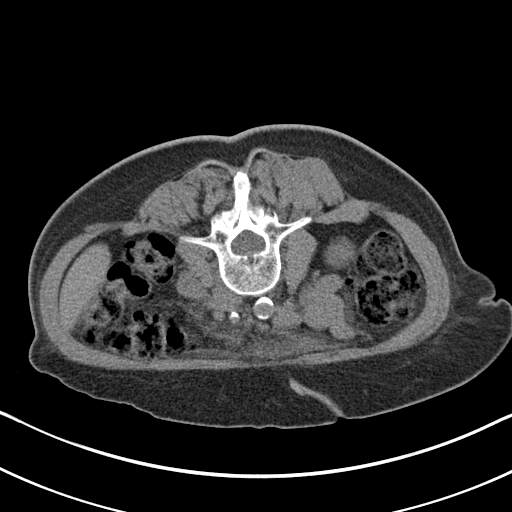
[im 16/38  soft-tissue]
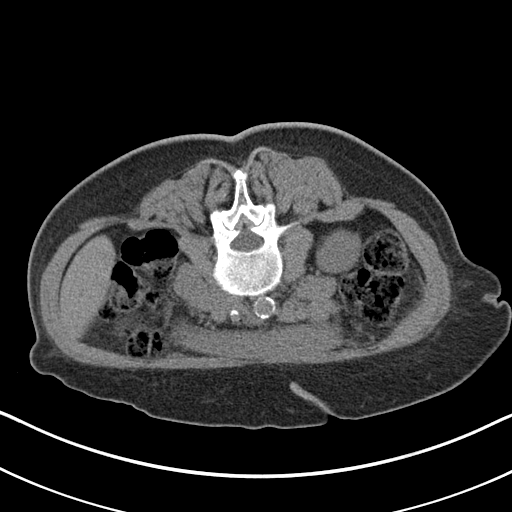
[im 19/38  soft-tissue]
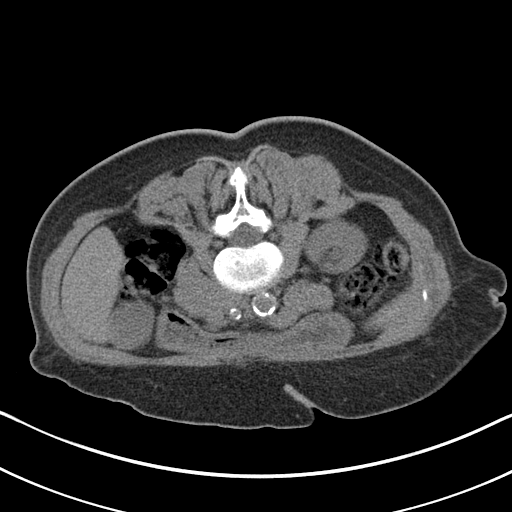
[im 22/38  soft-tissue]
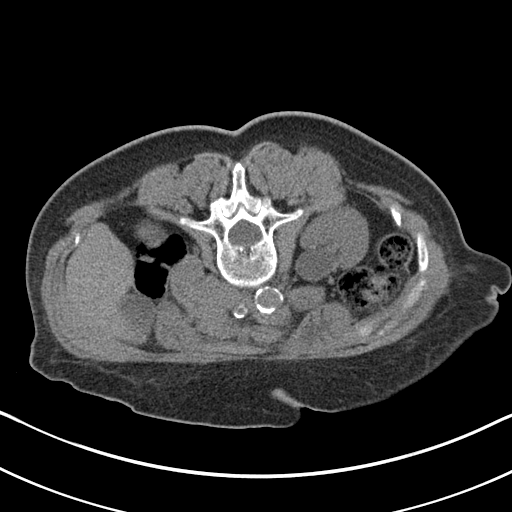
[im 24/38  soft-tissue]
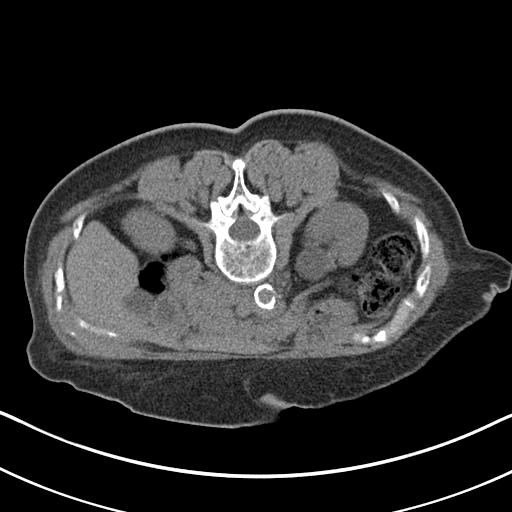
[im 24/38  bone]
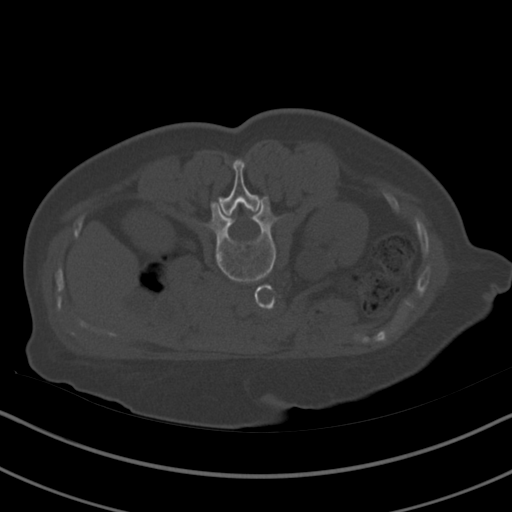
[im 27/38  soft-tissue]
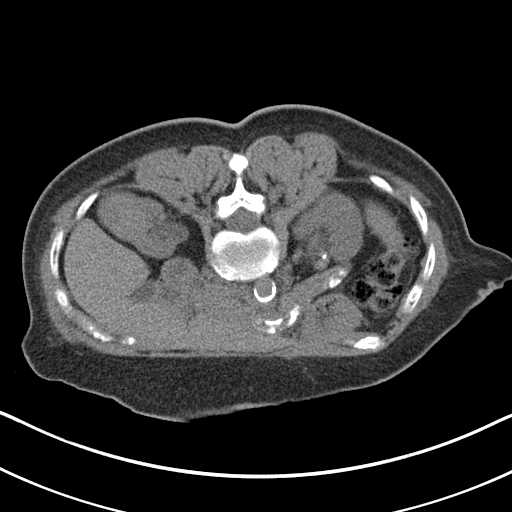
[im 27/38  lung]
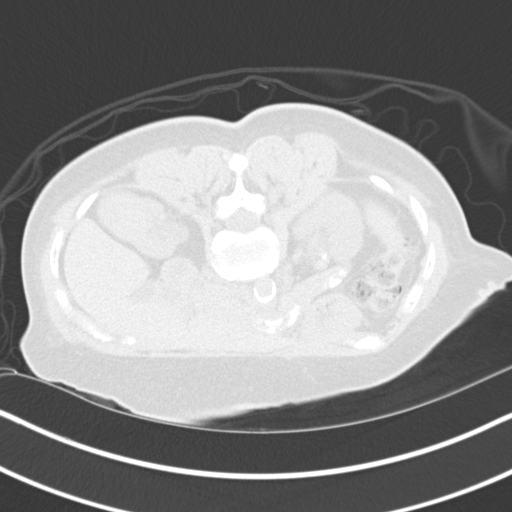
[im 30/38  soft-tissue]
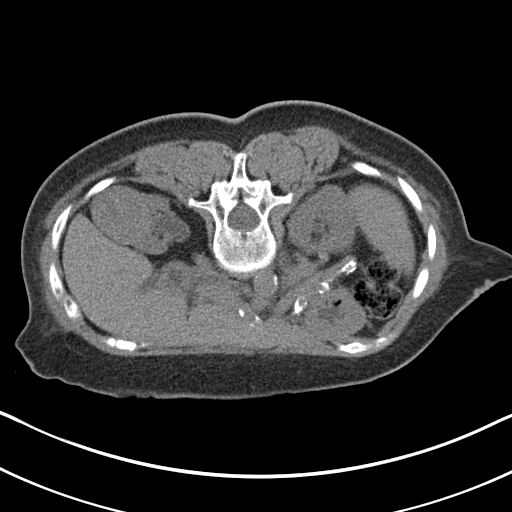
[im 30/38  lung]
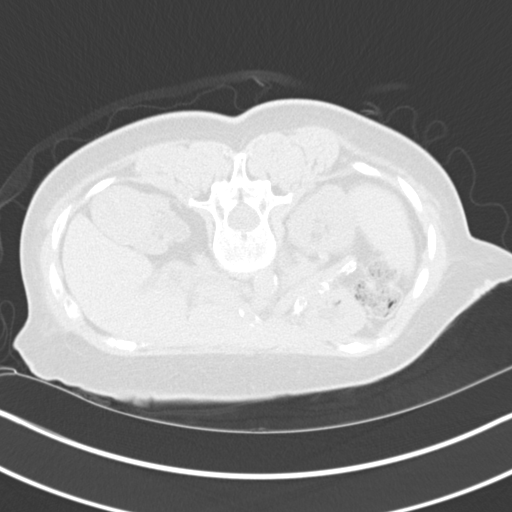
[im 32/38  soft-tissue]
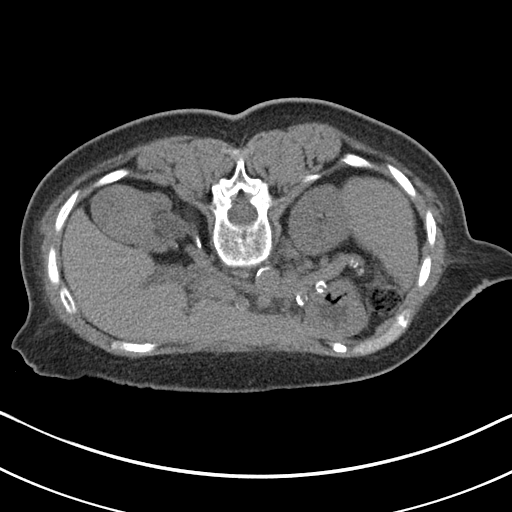
[im 32/38  lung]
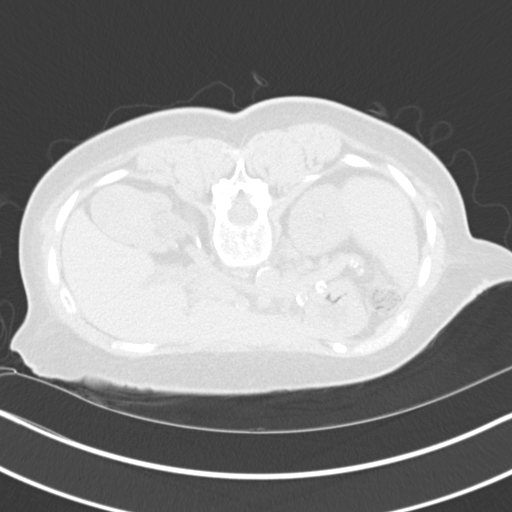
[im 35/38  soft-tissue]
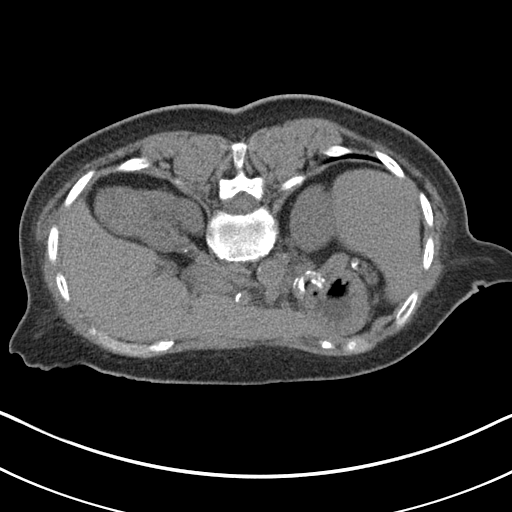
[im 35/38  lung]
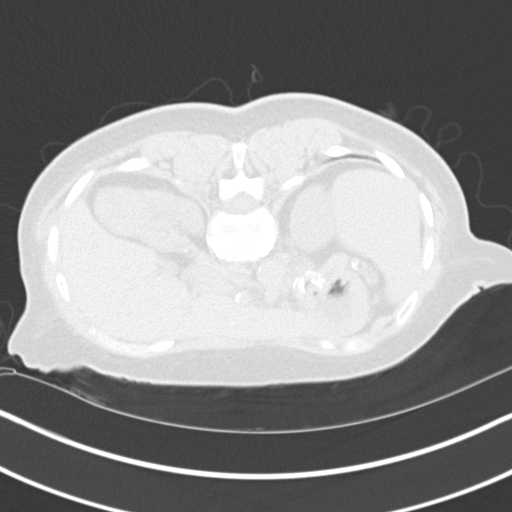

[13 of 32 positions shown; findings below may reference images not displayed]

EXAM:
CT GUIDED CORE BIOPSY OF RIGHT RENAL MASS

ANESTHESIA/SEDATION:
Intravenous Fentanyl [0F] and Versed 1mg were administered as
conscious sedation during continuous monitoring of the patient's
level of consciousness and physiological / cardiorespiratory status
by the radiology RN, with a total moderate sedation time of 20
minutes.

PROCEDURE:
The procedure risks, benefits, and alternatives were explained to
the patient. Questions regarding the procedure were encouraged and
answered. The patient understands and consents to the procedure.

Initially, ultrasound of the right kidney was performed,
demonstrating a hypoechoic solid-appearing mass measuring at least
3.9 cm partially cyst exophytic from the midpole region.

Based on the CT images, an appropriate skin entry site was
determined and marked.

The operative field was prepped with chlorhexidinein a sterile
fashion, and a sterile drape was applied covering the operative
field. A sterile gown and sterile gloves were used for the
procedure. Local anesthesia was provided with 1% Lidocaine.

Under CT fluoroscopic guidance, a 17 gauge trocar needle was
advanced to the margin of the lesion. Once needle tip position was
confirmed, coaxial 18-gauge core biopsy samples were obtained,
submitted in formalin to surgical pathology. The guide needle was
removed. Postprocedure scans show no hemorrhage or other apparent
complication. The patient tolerated the procedure well.

COMPLICATIONS:
None immediate
FINDINGS: Exophytic echogenic mass from the mid pole region of the right
kidney measuring at least 4 cm diameter on ultrasound. Mild right
hydronephrosis also identified.

Technically successful core biopsy of right renal mass.
IMPRESSION: 1. Technically successful CT-guided core biopsy, right renal mass.

## 2019-07-27 IMAGING — US CT BIOPSY
1 series · 7 of 7 positions shown · non-contrast
Comparison: none

CLINICAL DATA: Right renal mass

[Series 1: ct biopsy · 7 of 7 slices shown]
[im 1/7]
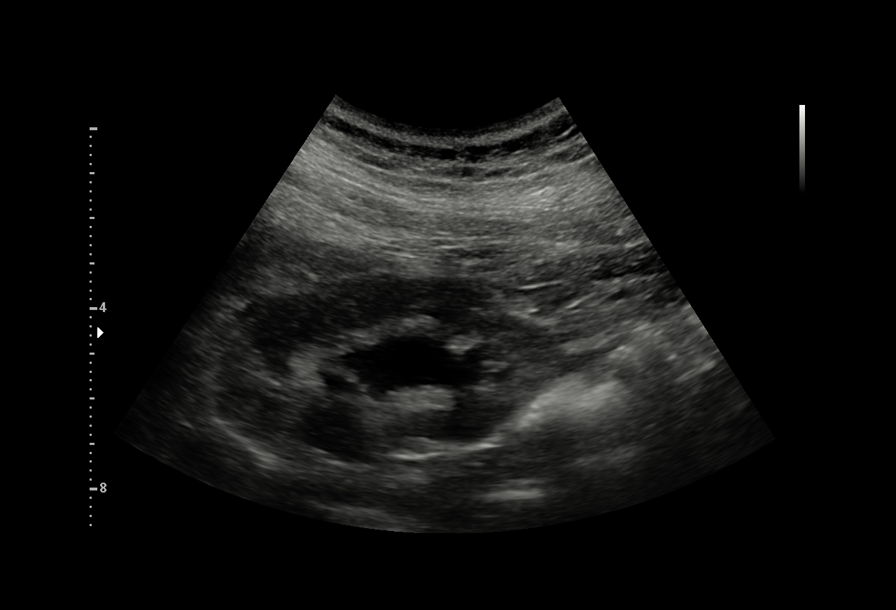
[im 2/7]
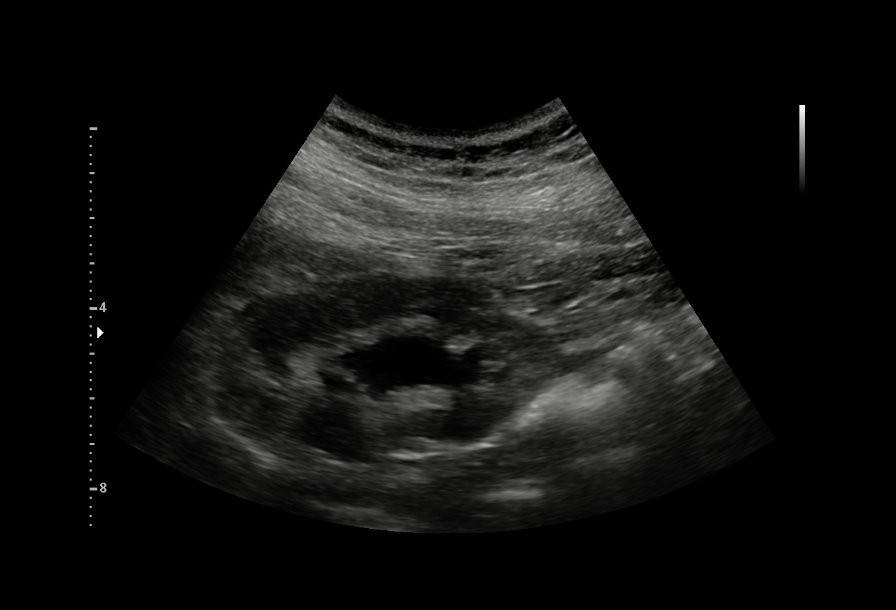
[im 3/7]
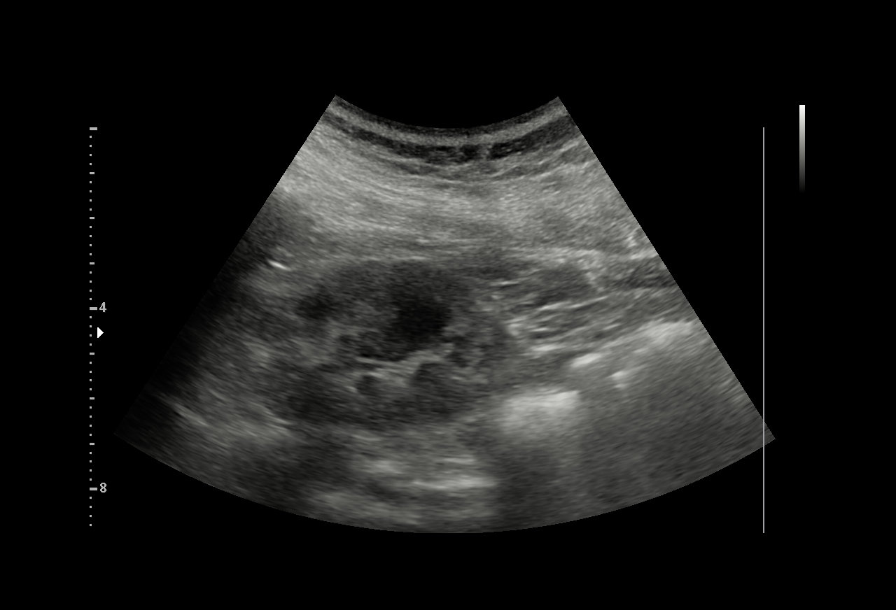
[im 4/7]
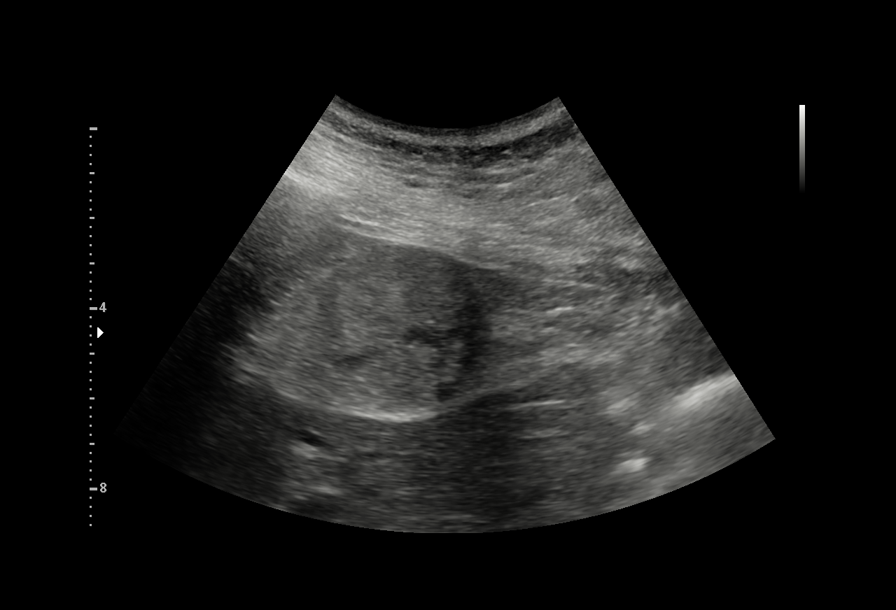
[im 5/7]
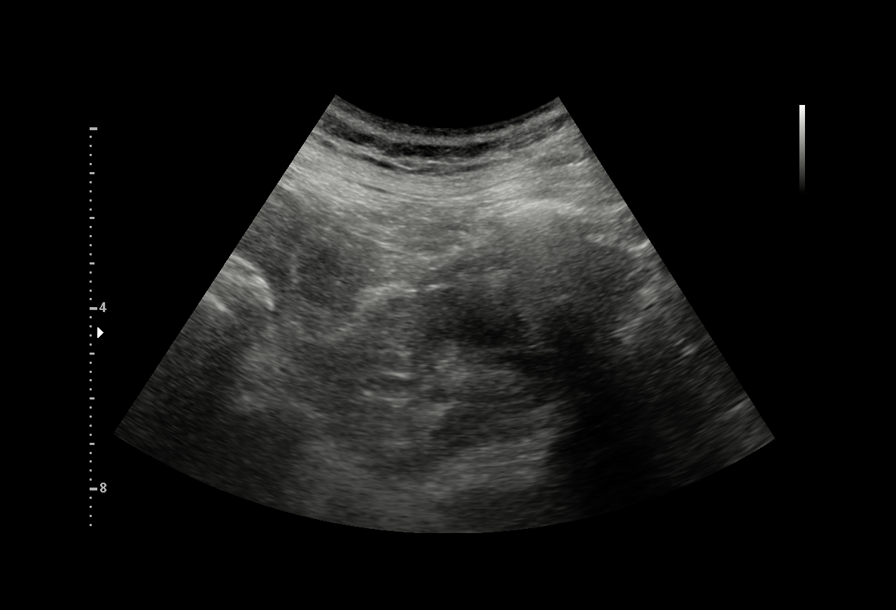
[im 6/7]
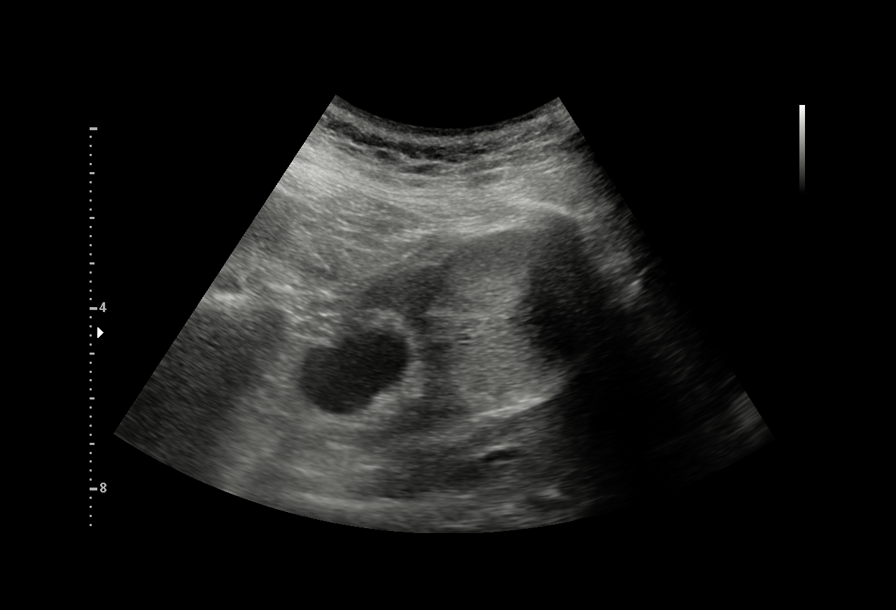
[im 7/7]
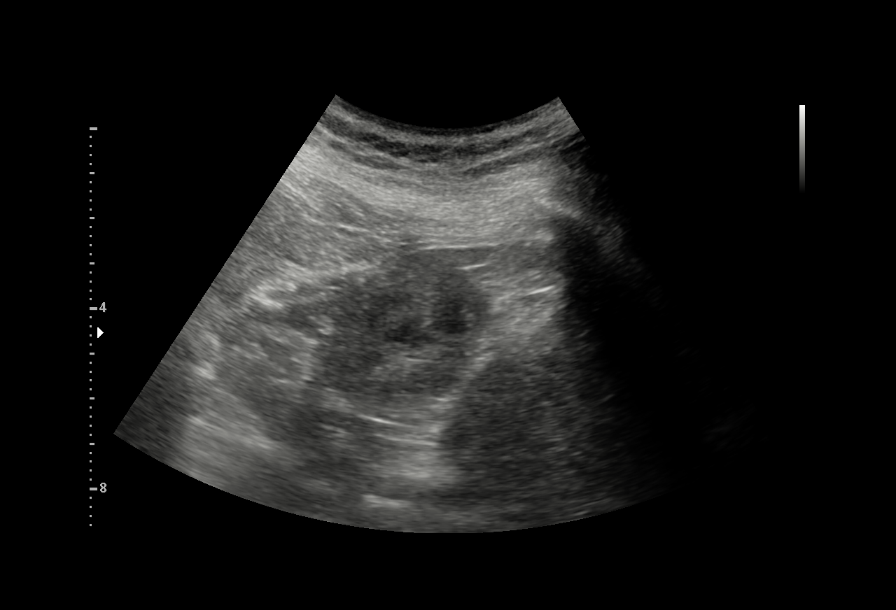

[7 of 7 positions shown; findings below may reference images not displayed]

EXAM:
CT GUIDED CORE BIOPSY OF RIGHT RENAL MASS

ANESTHESIA/SEDATION:
Intravenous Fentanyl [0F] and Versed 1mg were administered as
conscious sedation during continuous monitoring of the patient's
level of consciousness and physiological / cardiorespiratory status
by the radiology RN, with a total moderate sedation time of 20
minutes.

PROCEDURE:
The procedure risks, benefits, and alternatives were explained to
the patient. Questions regarding the procedure were encouraged and
answered. The patient understands and consents to the procedure.

Initially, ultrasound of the right kidney was performed,
demonstrating a hypoechoic solid-appearing mass measuring at least
3.9 cm partially cyst exophytic from the midpole region.

Based on the CT images, an appropriate skin entry site was
determined and marked.

The operative field was prepped with chlorhexidinein a sterile
fashion, and a sterile drape was applied covering the operative
field. A sterile gown and sterile gloves were used for the
procedure. Local anesthesia was provided with 1% Lidocaine.

Under CT fluoroscopic guidance, a 17 gauge trocar needle was
advanced to the margin of the lesion. Once needle tip position was
confirmed, coaxial 18-gauge core biopsy samples were obtained,
submitted in formalin to surgical pathology. The guide needle was
removed. Postprocedure scans show no hemorrhage or other apparent
complication. The patient tolerated the procedure well.

COMPLICATIONS:
None immediate
FINDINGS: Exophytic echogenic mass from the mid pole region of the right
kidney measuring at least 4 cm diameter on ultrasound. Mild right
hydronephrosis also identified.

Technically successful core biopsy of right renal mass.
IMPRESSION: 1. Technically successful CT-guided core biopsy, right renal mass.

## 2019-07-27 MED ORDER — FENTANYL CITRATE (PF) 100 MCG/2ML IJ SOLN
INTRAMUSCULAR | Status: AC | PRN
  Administered 2019-07-27: 50 ug via INTRAVENOUS

## 2019-07-27 MED ORDER — SODIUM CHLORIDE 0.9 % IV SOLN: INTRAVENOUS | Status: DC

## 2019-07-27 MED ORDER — MIDAZOLAM HCL 2 MG/2ML IJ SOLN
INTRAMUSCULAR | Status: AC
  Filled 2019-07-27: qty 2

## 2019-07-27 MED ORDER — FENTANYL CITRATE (PF) 100 MCG/2ML IJ SOLN
INTRAMUSCULAR | Status: AC
  Filled 2019-07-27: qty 2

## 2019-07-27 MED ORDER — MIDAZOLAM HCL 2 MG/2ML IJ SOLN
INTRAMUSCULAR | Status: AC | PRN
  Administered 2019-07-27: 1 mg via INTRAVENOUS

## 2019-07-27 MED ORDER — HYDROCODONE-ACETAMINOPHEN 5-325 MG PO TABS: 1.0000 | ORAL_TABLET | ORAL | Status: DC | PRN

## 2019-07-27 NOTE — Procedures (Signed)
  Procedure: RMP renal mass core biopsy under CT   EBL:   minimal Complications:  none immediate  See full dictation in [facility].  Dillard Cannon MD Main # [phone] Pager  [phone]

## 2019-07-27 NOTE — H&P (Signed)
Chief Complaint: Patient was seen in consultation today for right renal mass biopsy at the request of Henn,Adam  Referring Physician(s): Henn,Adam  Supervising Physician: Arne Cleveland  Patient Status: [facility] - Out-pt  History of Present Illness: Erin Jordan is a 74 y.o. female scheduled for right renal mass biopsy. PMHx, meds, labs, imaging, allergies reviewed. Feels well, no recent fevers, chills, illness. Has been NPO today as directed.   Past Medical History:  Diagnosis Date  . Arthritis   . Cancer of kidney ([place])   . Chronic kidney disease, stage 3, mod decreased GFR   . Coronary atherosclerosis of native coronary artery 2006   Multivessel status post CABG in [place], graft disease documented March 2019 with DES to SVG to diagonal  . Essential hypertension   . 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 History of diabetes mellitus, type II   . Hyperlipidemia   . NSTEMI (non-ST elevated myocardial infarction) ([place]) 06/24/2017  . Right renal mass     Past Surgical History:  Procedure Laterality Date  . ABDOMINAL HYSTERECTOMY    . CORONARY ARTERY BYPASS GRAFT  2006   [place], [place]  . CORONARY STENT INTERVENTION N/A 06/25/2017   Procedure: CORONARY STENT INTERVENTION;  Surgeon: Jettie Booze, MD;  Location: [place] [facility];  Service: Cardiovascular;  Laterality: N/A;  . IR RADIOLOGIST EVAL & MGMT  07/08/2019  . LEFT HEART CATH AND CORS/GRAFTS ANGIOGRAPHY N/A 06/25/2017   Procedure: LEFT HEART CATH AND CORS/GRAFTS ANGIOGRAPHY;  Surgeon: Jettie Booze, MD;  Location: [place] [facility];  Service: Cardiovascular;  Laterality: N/A;    Allergies: Patient has no known allergies.  Medications: Prior to Admission medications   Medication Sig Start Date End Date Taking? Authorizing Provider  amLODipine (NORVASC) 5 MG tablet TAKE ONE TABLET BY MOUTH ONCE DAILY. Patient taking differently: Take 5 mg by mouth daily.  06/28/19  Yes Hendricks Limes F, FNP  aspirin EC 81 MG tablet Take 1 tablet (81 mg total) by mouth daily. 07/14/17  Yes Imogene Burn, PA-C  atorvastatin (LIPITOR) 80 MG tablet TAKE 1 TABLET BY MOUTH ONCE DAILY. Patient taking differently: Take 80 mg by mouth daily.  06/28/19  Yes Hendricks Limes F, FNP  calcium carbonate (OSCAL) 1500 (600 Ca) MG TABS tablet Take 1,500 mg by mouth daily with breakfast.    Yes [provider]  cholecalciferol (VITAMIN D) 25 MCG (1000 UT) tablet Take 1,000 Units by mouth daily.    Yes [provider]  donepezil (ARICEPT) 10 MG tablet Take 1 tablet (10 mg total) by mouth at bedtime. 03/24/19  Yes Hendricks Limes F, FNP  methimazole (TAPAZOLE) 5 MG tablet Take 1 tablet (5 mg total) by mouth daily. 01/25/19  Yes Hendricks Limes F, FNP  metoprolol succinate (TOPROL XL) 25 MG 24 hr tablet Take 1 tablet (25 mg total) by mouth daily. 06/24/19  Yes Strader, [place] M, PA-C  nitroGLYCERIN (NITROSTAT) 0.4 MG SL tablet Place 1 tablet (0.4 mg total) under the tongue every 5 (five) minutes x 3 doses as needed for chest pain. 07/07/19  Yes Satira Sark, MD     Family History  Problem Relation Age of Onset  . Heart attack Mother   . Hypertension Mother   . Seizures Son   . Seizures Son   . Seizures Daughter     Social History   Socioeconomic History  . Marital status: Widowed    Spouse name: Not on file  .  Number of children: 6  . Years of education: Not on file  . Highest education level: Not on file  Occupational History  . Occupation: retired  Tobacco Use  . Smoking status: Never Smoker  . Smokeless tobacco: Never Used  Substance and Sexual Activity  . Alcohol use: No  . Drug use: No  . Sexual activity: Never  Other Topics Concern  . Not on file  Social History Narrative   Lives in [place] with her daughter and grandchildren.   Social Determinants of Health   Financial Resource Strain: Medium Risk  . Difficulty of Paying Living Expenses: Somewhat  hard  Food Insecurity: No Food Insecurity  . Worried About Charity fundraiser in the Last Year: Never true  . Ran Out of Food in the Last Year: Never true  Transportation Needs: No Transportation Needs  . Lack of Transportation (Medical): No  . Lack of Transportation (Non-Medical): No  Physical Activity: Inactive  . Days of Exercise per Week: 0 days  . Minutes of Exercise per Session: 0 min  Stress: No Stress Concern Present  . Feeling of Stress : Only a little  Social Connections: Somewhat Isolated  . Frequency of Communication with Friends and Family: Three times a week  . Frequency of Social Gatherings with Friends and Family: Three times a week  . Attends Religious Services: 1 to 4 times per year  . Active Member of Clubs or Organizations: No  . Attends Archivist Meetings: Never  . Marital Status: Widowed   Review of Systems: A 12 point ROS discussed and pertinent positives are indicated in the HPI above.  All other systems are negative.  Review of Systems  Vital Signs: BP (!) 171/83   Pulse 74   Temp 98.2 F (36.8 C) (Oral)   Resp 16   Ht 5\' 2"  (1.575 m)   Wt 52.6 kg   SpO2 100%   BMI 21.21 kg/m   Physical Exam Constitutional:      Appearance: Normal appearance. She is not ill-appearing.  HENT:     Mouth/Throat:     Mouth: Mucous membranes are moist.  Cardiovascular:     Rate and Rhythm: Normal rate and regular rhythm.     Heart sounds: Normal heart sounds.  Pulmonary:     Effort: Pulmonary effort is normal. No respiratory distress.     Breath sounds: Normal breath sounds.  Skin:    General: Skin is warm and dry.  Neurological:     General: No focal deficit present.     Mental Status: She is alert and oriented to person, place, and time.  Psychiatric:        Mood and Affect: Mood normal.        Thought Content: Thought content normal.        Judgment: Judgment normal.      Labs:  CBC: Recent Labs    01/29/19 0941  WBC 5.7  HGB 13.3    HCT 40.6  PLT 198    COAGS: No results for input(s): INR, APTT in the last 8760 hours.  BMP: Recent Labs    01/29/19 0941  NA 142  K 4.1  CL 107  CO2 27  GLUCOSE 125*  BUN 38*  CALCIUM 9.6  CREATININE 1.61*  GFRNONAA 31*  GFRAA 36*    LIVER FUNCTION TESTS: Recent Labs    01/29/19 0941  BILITOT 0.6  AST 17  ALT 16  ALKPHOS 85  PROT 7.1  ALBUMIN  4.2    TUMOR MARKERS: No results for input(s): AFPTM, CEA, CA199, CHROMGRNA in the last 8760 hours.  Assessment and Plan: Right renal mass For CT guided biopsy Labs pending Risks and benefits of renal mass biopsy was discussed with the patient and/or patient's family including, but not limited to bleeding, infection, damage to adjacent structures or low yield requiring additional tests.  All of the questions were answered and there is agreement to proceed.  Consent signed and in chart.    Thank you for this interesting consult.  I greatly enjoyed meeting Erin Jordan and look forward to participating in their care.  A copy of this report was sent to the requesting provider on this date.  Electronically Signed: Ascencion Dike, PA-C 07/27/2019, 10:40 AM   I spent a total of 25 minutes in face to face in clinical consultation, greater than 50% of which was counseling/coordinating care for renal mass

## 2019-07-27 NOTE — Discharge Instructions (Addendum)
Percutaneous Kidney Biopsy, Care After This sheet gives you information about how to care for yourself after your procedure. Your health care provider may also give you more specific instructions. If you have problems or questions, contact your health care provider. What can I expect after the procedure? After the procedure, it is common to have:  Pain or soreness near the biopsy site.  Pink or cloudy urine for 24 hours after the procedure. Follow these instructions at home: Activity  Return to your normal activities as told by your health care provider. Ask your health care provider what activities are safe for you.  If you were given a sedative during the procedure, it can affect you for several hours. Do not drive or operate machinery until your health care provider says that it is safe.  Do not lift anything that is heavier than 10 lb (4.5 kg), or the limit that you are told, until your health care provider says that it is safe.  Avoid activities that take a lot of effort (are strenuous) until your health care provider approves. Most people will have to wait 2 weeks before returning to activities such as exercise or sex. General instructions   Take over-the-counter and prescription medicines only as told by your health care provider.  You may eat and drink after your procedure. Follow instructions from your health care provider about eating or drinking restrictions.  Check your biopsy site every day for signs of infection. Check for: ? More redness, swelling, or pain. ? Fluid or blood. ? Warmth. ? Pus or a bad smell.  Keep all follow-up visits as told by your health care provider. This is important. Contact a health care provider if:  You have more redness, swelling, or pain around your biopsy site.  You have fluid or blood coming from your biopsy site.  Your biopsy site feels warm to the touch.  You have pus or a bad smell coming from your biopsy site.  You have blood  in your urine more than 24 hours after your procedure.  You have a fever. Get help right away if:  Your urine is dark red or brown.  You cannot urinate.  It burns when you urinate.  You feel dizzy or light-headed.  You have severe pain in your abdomen or side. Summary  After the procedure, it is common to have pain or soreness at the biopsy site and pink or cloudy urine for the first 24 hours.  Check your biopsy site each day for signs of infection, such as more redness, swelling, or pain; fluid, blood, pus or a bad smell coming from the biopsy site; or the biopsy site feeling warm to touch.  Return to your normal activities as told by your health care provider. This information is not intended to replace advice given to you by your health care provider. Make sure you discuss any questions you have with your health care provider. Document Revised: 11/26/2018 Document Reviewed: 11/26/2018 Elsevier Patient Education  2020 Elsevier Inc. Moderate Conscious Sedation, Adult Sedation is the use of medicines to promote relaxation and relieve discomfort and anxiety. Moderate conscious sedation is a type of sedation. Under moderate conscious sedation, you are less alert than normal, but you are still able to respond to instructions, touch, or both. Moderate conscious sedation is used during short medical and dental procedures. It is milder than deep sedation, which is a type of sedation under which you cannot be easily woken up. It is also milder than   general anesthesia, which is the use of medicines to make you unconscious. Moderate conscious sedation allows you to return to your regular activities sooner. Tell a health care provider about:  Any allergies you have.  All medicines you are taking, including vitamins, herbs, eye drops, creams, and over-the-counter medicines.  Use of steroids (by mouth or creams).  Any problems you or family members have had with sedatives and anesthetic  medicines.  Any blood disorders you have.  Any surgeries you have had.  Any medical conditions you have, such as sleep apnea.  Whether you are pregnant or may be pregnant.  Any use of cigarettes, alcohol, marijuana, or street drugs. What are the risks? Generally, this is a safe procedure. However, problems may occur, including:  Getting too much medicine (oversedation).  Nausea.  Allergic reaction to medicines.  Trouble breathing. If this happens, a breathing tube may be used to help with breathing. It will be removed when you are awake and breathing on your own.  Heart trouble.  Lung trouble. What happens before the procedure? Staying hydrated Follow instructions from your health care provider about hydration, which may include:  Up to 2 hours before the procedure - you may continue to drink clear liquids, such as water, clear fruit juice, black coffee, and plain tea. Eating and drinking restrictions Follow instructions from your health care provider about eating and drinking, which may include:  8 hours before the procedure - stop eating heavy meals or foods such as meat, fried foods, or fatty foods.  6 hours before the procedure - stop eating light meals or foods, such as toast or cereal.  6 hours before the procedure - stop drinking milk or drinks that contain milk.  2 hours before the procedure - stop drinking clear liquids. Medicine Ask your health care provider about:  Changing or stopping your regular medicines. This is especially important if you are taking diabetes medicines or blood thinners.  Taking medicines such as aspirin and ibuprofen. These medicines can thin your blood. Do not take these medicines before your procedure if your health care provider instructs you not to.  Tests and exams  You will have a physical exam.  You may have blood tests done to show: ? How well your kidneys and liver are working. ? How well your blood can clot. General  instructions  Plan to have someone take you home from the hospital or clinic.  If you will be going home right after the procedure, plan to have someone with you for 24 hours. What happens during the procedure?  An IV tube will be inserted into one of your veins.  Medicine to help you relax (sedative) will be given through the IV tube.  The medical or dental procedure will be performed. What happens after the procedure?  Your blood pressure, heart rate, breathing rate, and blood oxygen level will be monitored often until the medicines you were given have worn off.  Do not drive for 24 hours. This information is not intended to replace advice given to you by your health care provider. Make sure you discuss any questions you have with your health care provider. Document Revised: 03/07/2017 Document Reviewed: 07/15/2015 Elsevier Patient Education  2020 Elsevier Inc.  

## 2019-07-27 NOTE — Progress Notes (Signed)
Discharge instructions reviewed with patient and family. Verbalized understanding. 

## 2019-07-27 NOTE — Sedation Documentation (Signed)
Specimen in formalin taken by [facility] CT tech for processing to the lab

## 2019-07-29 LAB — SURGICAL PATHOLOGY

## 2019-08-02 ENCOUNTER — Emergency Department (HOSPITAL_COMMUNITY): Payer: Medicare HMO

## 2019-08-02 ENCOUNTER — Other Ambulatory Visit: Payer: Self-pay

## 2019-08-02 ENCOUNTER — Inpatient Hospital Stay (HOSPITAL_COMMUNITY)
Admission: EM | Admit: 2019-08-02 | Discharge: 2019-08-06 | DRG: 920 | Disposition: A | Discharge status: Home Health | Payer: Medicare HMO | Attending: Family Medicine | Admitting: Family Medicine

## 2019-08-02 ENCOUNTER — Other Ambulatory Visit: Payer: Self-pay | Admitting: Family Medicine

## 2019-08-02 DIAGNOSIS — Z7982 Long term (current) use of aspirin: Secondary | ICD-10-CM

## 2019-08-02 DIAGNOSIS — Z955 Presence of coronary angioplasty implant and graft: Secondary | ICD-10-CM

## 2019-08-02 DIAGNOSIS — Z8639 Personal history of other endocrine, nutritional and metabolic disease: Secondary | ICD-10-CM

## 2019-08-02 DIAGNOSIS — E0590 Thyrotoxicosis, unspecified without thyrotoxic crisis or storm: Secondary | ICD-10-CM | POA: Diagnosis not present

## 2019-08-02 DIAGNOSIS — M1990 Unspecified osteoarthritis, unspecified site: Secondary | ICD-10-CM | POA: Diagnosis present

## 2019-08-02 DIAGNOSIS — I252 Old myocardial infarction: Secondary | ICD-10-CM

## 2019-08-02 DIAGNOSIS — D62 Acute posthemorrhagic anemia: Secondary | ICD-10-CM | POA: Diagnosis present

## 2019-08-02 DIAGNOSIS — I129 Hypertensive chronic kidney disease with stage 1 through stage 4 chronic kidney disease, or unspecified chronic kidney disease: Secondary | ICD-10-CM | POA: Diagnosis present

## 2019-08-02 DIAGNOSIS — N99840 Postprocedural hematoma of a genitourinary system organ or structure following a genitourinary system procedure: Secondary | ICD-10-CM | POA: Diagnosis not present

## 2019-08-02 DIAGNOSIS — I2510 Atherosclerotic heart disease of native coronary artery without angina pectoris: Secondary | ICD-10-CM | POA: Diagnosis present

## 2019-08-02 DIAGNOSIS — E785 Hyperlipidemia, unspecified: Secondary | ICD-10-CM | POA: Diagnosis present

## 2019-08-02 DIAGNOSIS — C641 Malignant neoplasm of right kidney, except renal pelvis: Secondary | ICD-10-CM | POA: Diagnosis present

## 2019-08-02 DIAGNOSIS — Z8249 Family history of ischemic heart disease and other diseases of the circulatory system: Secondary | ICD-10-CM | POA: Diagnosis not present

## 2019-08-02 DIAGNOSIS — Z20822 Contact with and (suspected) exposure to covid-19: Secondary | ICD-10-CM | POA: Diagnosis present

## 2019-08-02 DIAGNOSIS — F0390 Unspecified dementia without behavioral disturbance: Secondary | ICD-10-CM | POA: Diagnosis present

## 2019-08-02 DIAGNOSIS — I10 Essential (primary) hypertension: Secondary | ICD-10-CM | POA: Diagnosis not present

## 2019-08-02 DIAGNOSIS — C649 Malignant neoplasm of unspecified kidney, except renal pelvis: Secondary | ICD-10-CM | POA: Diagnosis present

## 2019-08-02 DIAGNOSIS — N1832 Chronic kidney disease, stage 3b: Secondary | ICD-10-CM | POA: Diagnosis present

## 2019-08-02 DIAGNOSIS — R7303 Prediabetes: Secondary | ICD-10-CM

## 2019-08-02 DIAGNOSIS — E1122 Type 2 diabetes mellitus with diabetic chronic kidney disease: Secondary | ICD-10-CM | POA: Diagnosis present

## 2019-08-02 DIAGNOSIS — N179 Acute kidney failure, unspecified: Secondary | ICD-10-CM | POA: Diagnosis present

## 2019-08-02 DIAGNOSIS — Z951 Presence of aortocoronary bypass graft: Secondary | ICD-10-CM

## 2019-08-02 DIAGNOSIS — E876 Hypokalemia: Secondary | ICD-10-CM | POA: Diagnosis not present

## 2019-08-02 DIAGNOSIS — N9989 Other postprocedural complications and disorders of genitourinary system: Secondary | ICD-10-CM | POA: Diagnosis present

## 2019-08-02 DIAGNOSIS — E0520 Thyrotoxicosis with toxic multinodular goiter without thyrotoxic crisis or storm: Secondary | ICD-10-CM | POA: Diagnosis present

## 2019-08-02 DIAGNOSIS — N99820 Postprocedural hemorrhage and hematoma of a genitourinary system organ or structure following a genitourinary system procedure: Principal | ICD-10-CM | POA: Diagnosis present

## 2019-08-02 DIAGNOSIS — S37019A Minor contusion of unspecified kidney, initial encounter: Secondary | ICD-10-CM

## 2019-08-02 DIAGNOSIS — R1011 Right upper quadrant pain: Secondary | ICD-10-CM | POA: Diagnosis present

## 2019-08-02 DIAGNOSIS — Z9861 Coronary angioplasty status: Secondary | ICD-10-CM

## 2019-08-02 DIAGNOSIS — N2889 Other specified disorders of kidney and ureter: Secondary | ICD-10-CM

## 2019-08-02 DIAGNOSIS — E042 Nontoxic multinodular goiter: Secondary | ICD-10-CM

## 2019-08-02 HISTORY — DX: Minor contusion of unspecified kidney, initial encounter: S37.019A

## 2019-08-02 LAB — LACTIC ACID, PLASMA: Lactic Acid, Venous: 1 mmol/L (ref 0.5–1.9)

## 2019-08-02 LAB — CBC WITH DIFFERENTIAL/PLATELET
Abs Immature Granulocytes: 0.04 10*3/uL (ref 0.00–0.07)
Basophils Absolute: 0 10*3/uL (ref 0.0–0.1)
Basophils Relative: 0 %
Eosinophils Absolute: 0.1 10*3/uL (ref 0.0–0.5)
Eosinophils Relative: 1 %
HCT: 28.7 % — ABNORMAL LOW (ref 36.0–46.0)
Hemoglobin: 9.6 g/dL — ABNORMAL LOW (ref 12.0–15.0)
Immature Granulocytes: 0 %
Lymphocytes Relative: 10 %
Lymphs Abs: 1.1 10*3/uL (ref 0.7–4.0)
MCH: 31.1 pg (ref 26.0–34.0)
MCHC: 33.4 g/dL (ref 30.0–36.0)
MCV: 92.9 fL (ref 80.0–100.0)
Monocytes Absolute: 1.3 10*3/uL — ABNORMAL HIGH (ref 0.1–1.0)
Monocytes Relative: 12 %
Neutro Abs: 7.9 10*3/uL — ABNORMAL HIGH (ref 1.7–7.7)
Neutrophils Relative %: 77 %
Platelets: 250 10*3/uL (ref 150–400)
RBC: 3.09 MIL/uL — ABNORMAL LOW (ref 3.87–5.11)
RDW: 12.3 % (ref 11.5–15.5)
WBC: 10.4 10*3/uL (ref 4.0–10.5)
nRBC: 0 % (ref 0.0–0.2)

## 2019-08-02 LAB — URINALYSIS, ROUTINE W REFLEX MICROSCOPIC
Bilirubin Urine: NEGATIVE
Glucose, UA: NEGATIVE mg/dL
Ketones, ur: NEGATIVE mg/dL
Leukocytes,Ua: NEGATIVE
Nitrite: NEGATIVE
Protein, ur: 30 mg/dL — AB
Specific Gravity, Urine: 1.009 (ref 1.005–1.030)
pH: 5 (ref 5.0–8.0)

## 2019-08-02 LAB — COMPREHENSIVE METABOLIC PANEL
ALT: 26 U/L (ref 0–44)
AST: 26 U/L (ref 15–41)
Albumin: 3.4 g/dL — ABNORMAL LOW (ref 3.5–5.0)
Alkaline Phosphatase: 94 U/L (ref 38–126)
Anion gap: 15 (ref 5–15)
BUN: 96 mg/dL — ABNORMAL HIGH (ref 8–23)
CO2: 20 mmol/L — ABNORMAL LOW (ref 22–32)
Calcium: 8.6 mg/dL — ABNORMAL LOW (ref 8.9–10.3)
Chloride: 101 mmol/L (ref 98–111)
Creatinine, Ser: 6.75 mg/dL — ABNORMAL HIGH (ref 0.44–1.00)
GFR calc Af Amer: 6 mL/min — ABNORMAL LOW (ref >60)
GFR calc non Af Amer: 5 mL/min — ABNORMAL LOW (ref >60)
Glucose, Bld: 186 mg/dL — ABNORMAL HIGH (ref 70–99)
Potassium: 3 mmol/L — ABNORMAL LOW (ref 3.5–5.1)
Sodium: 136 mmol/L (ref 135–145)
Total Bilirubin: 1 mg/dL (ref 0.3–1.2)
Total Protein: 6.8 g/dL (ref 6.5–8.1)

## 2019-08-02 LAB — LIPASE, BLOOD: Lipase: 290 U/L — ABNORMAL HIGH (ref 11–51)

## 2019-08-02 LAB — RENAL FUNCTION PANEL
Albumin: 3.2 g/dL — ABNORMAL LOW (ref 3.5–5.0)
Anion gap: 16 — ABNORMAL HIGH (ref 5–15)
BUN: 92 mg/dL — ABNORMAL HIGH (ref 8–23)
CO2: 20 mmol/L — ABNORMAL LOW (ref 22–32)
Calcium: 9.1 mg/dL (ref 8.9–10.3)
Chloride: 103 mmol/L (ref 98–111)
Creatinine, Ser: 6.39 mg/dL — ABNORMAL HIGH (ref 0.44–1.00)
GFR calc Af Amer: 7 mL/min — ABNORMAL LOW (ref >60)
GFR calc non Af Amer: 6 mL/min — ABNORMAL LOW (ref >60)
Glucose, Bld: 145 mg/dL — ABNORMAL HIGH (ref 70–99)
Phosphorus: 5.1 mg/dL — ABNORMAL HIGH (ref 2.5–4.6)
Potassium: 4.5 mmol/L (ref 3.5–5.1)
Sodium: 139 mmol/L (ref 135–145)

## 2019-08-02 LAB — CK: Total CK: 90 U/L (ref 38–234)

## 2019-08-02 LAB — ABO/RH: ABO/RH(D): B POS

## 2019-08-02 LAB — T4, FREE: Free T4: 1.16 ng/dL — ABNORMAL HIGH (ref 0.61–1.12)

## 2019-08-02 LAB — RESPIRATORY PANEL BY RT PCR (FLU A&B, COVID)
Influenza A by PCR: NEGATIVE
Influenza B by PCR: NEGATIVE
SARS Coronavirus 2 by RT PCR: NEGATIVE

## 2019-08-02 LAB — TYPE AND SCREEN
ABO/RH(D): B POS
Antibody Screen: NEGATIVE

## 2019-08-02 LAB — HEMOGLOBIN AND HEMATOCRIT, BLOOD
HCT: 30.4 % — ABNORMAL LOW (ref 36.0–46.0)
Hemoglobin: 10.4 g/dL — ABNORMAL LOW (ref 12.0–15.0)

## 2019-08-02 IMAGING — CT CT ABD-PELV W/O CM
1 series · 15 of 32 positions shown, 19 images · non-contrast
Comparison: Six days ago

CLINICAL DATA: Recent kidney biopsy with sharp abdominal pain.

EXAM:
CT ABDOMEN AND PELVIS WITHOUT CONTRAST
TECHNIQUE: Multidetector CT imaging of the abdomen and pelvis was performed
following the standard protocol without IV contrast.

[Series 2: axial st · axial · 0.69mm/px · z∈[-319,+36]mm · 15 of 80 slices shown, 19 images]
[im 6/80  soft-tissue]
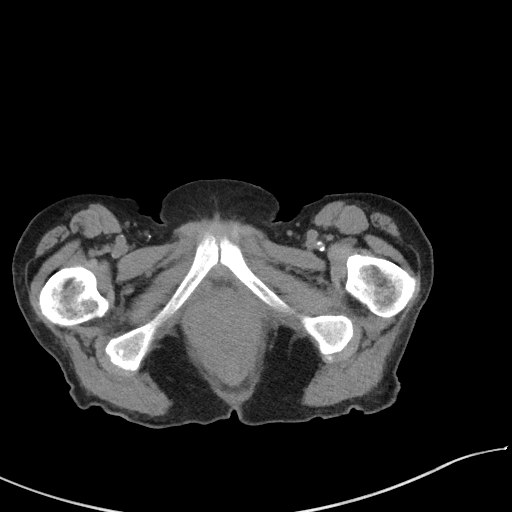
[im 6/80  bone]
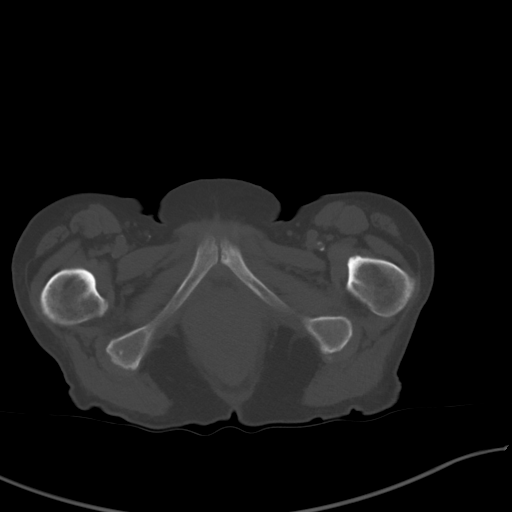
[im 11/80  soft-tissue]
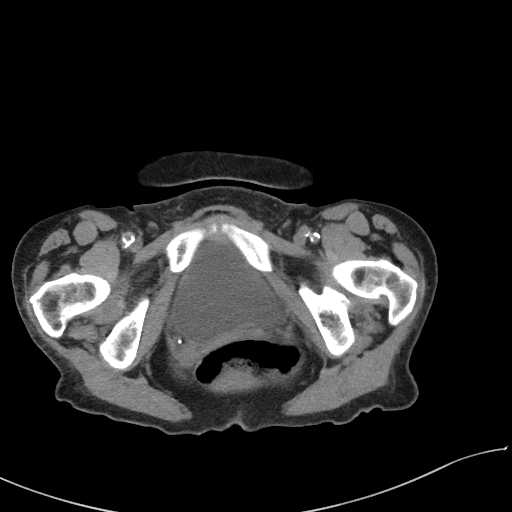
[im 16/80  soft-tissue]
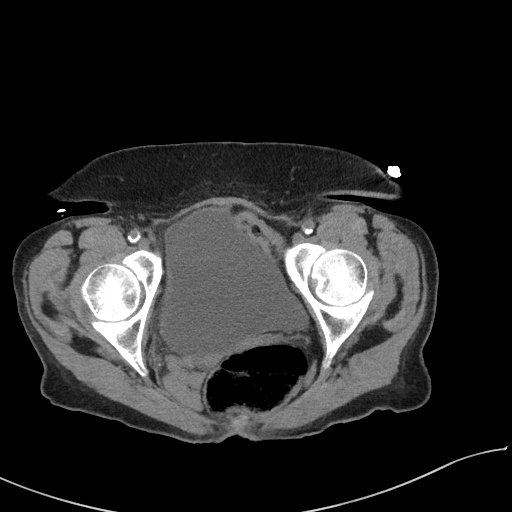
[im 23/80  soft-tissue]
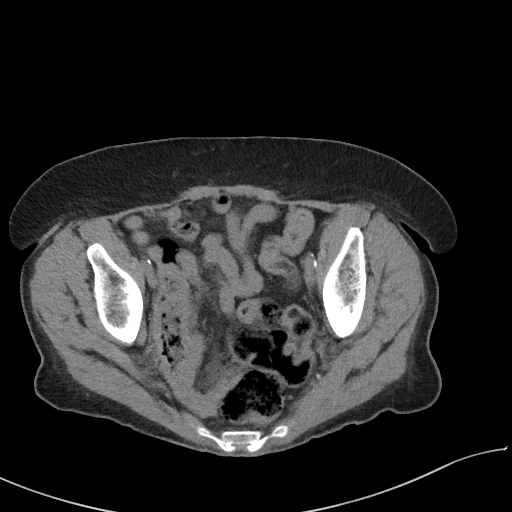
[im 29/80  soft-tissue]
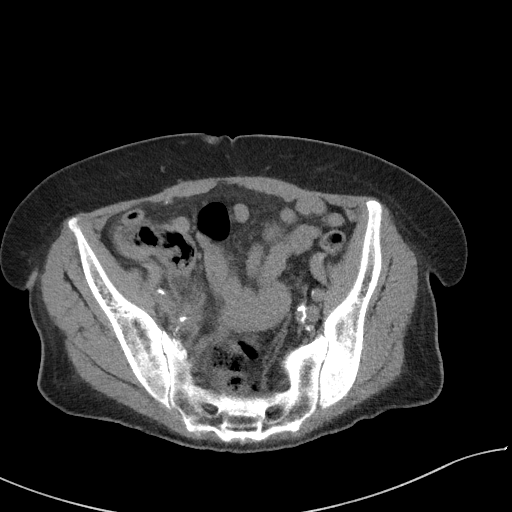
[im 34/80  soft-tissue]
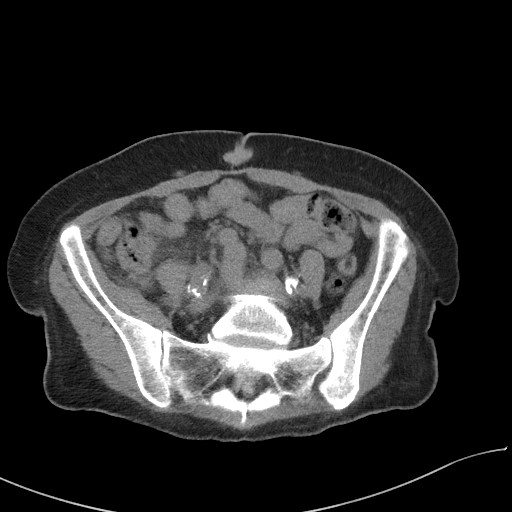
[im 41/80  soft-tissue]
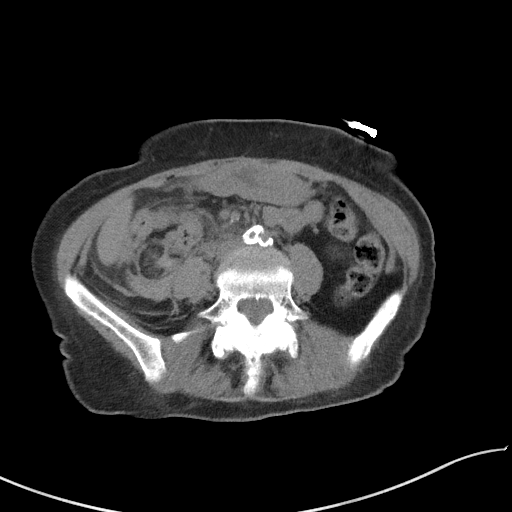
[im 46/80  soft-tissue]
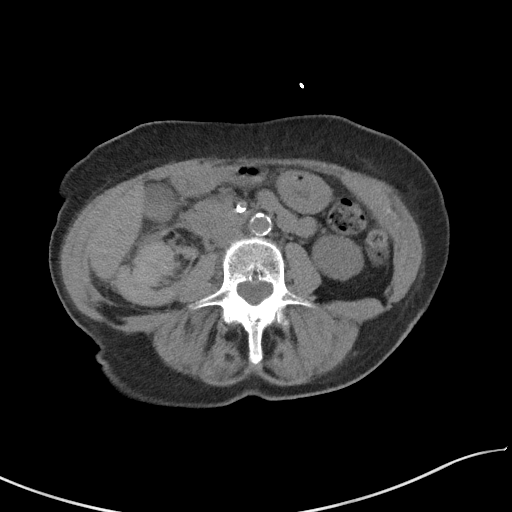
[im 51/80  soft-tissue]
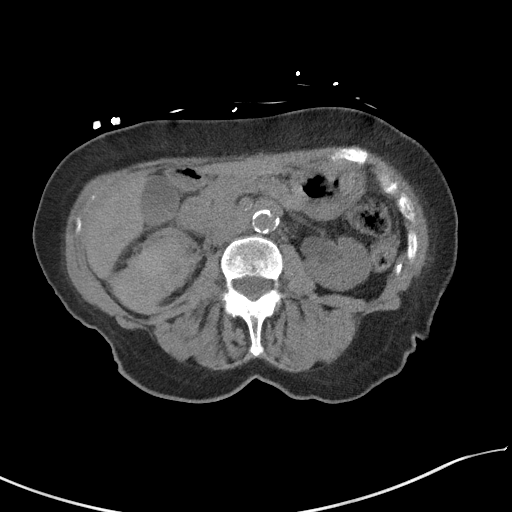
[im 51/80  bone]
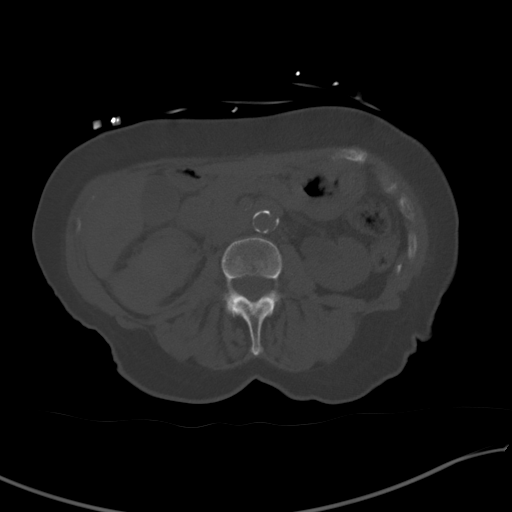
[im 57/80  soft-tissue]
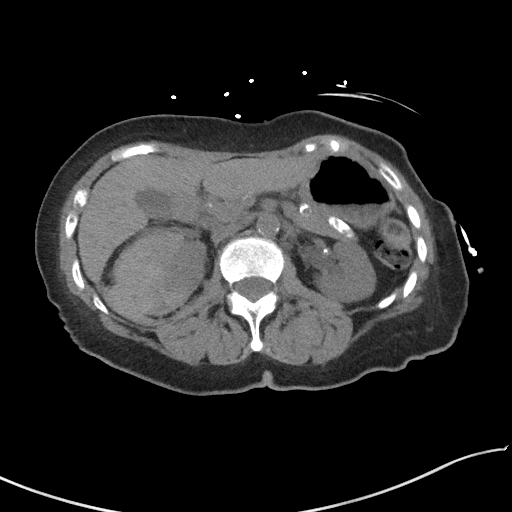
[im 64/80  soft-tissue]
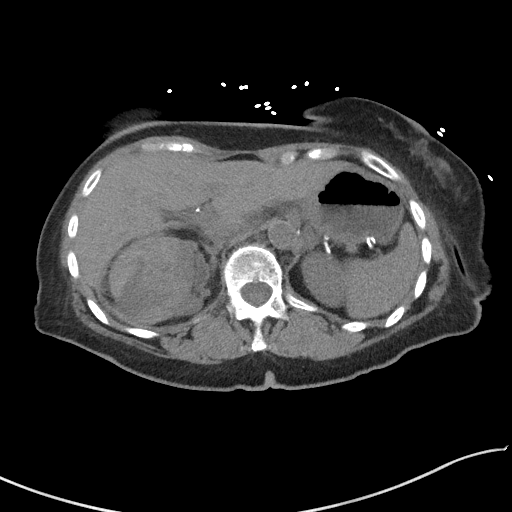
[im 69/80  soft-tissue]
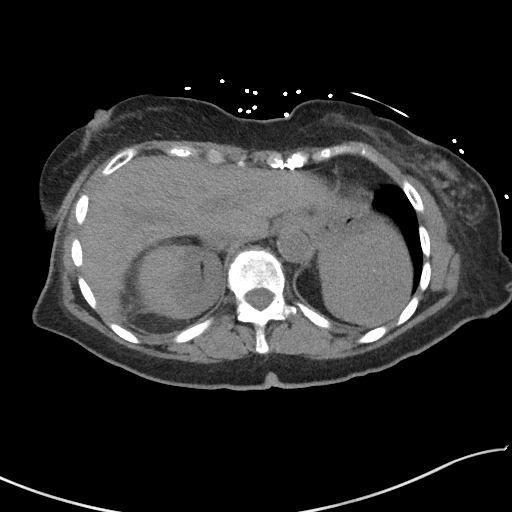
[im 69/80  lung]
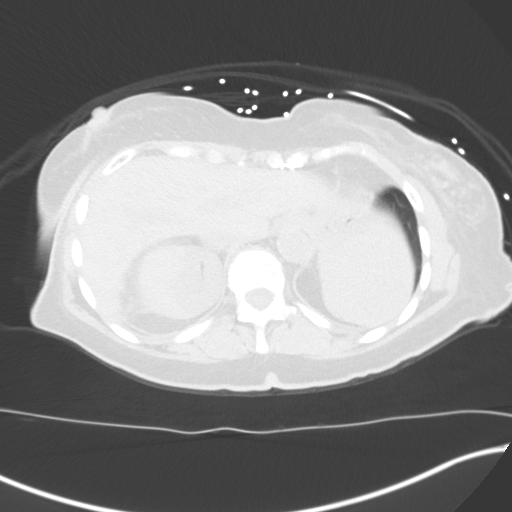
[im 72/80  lung]
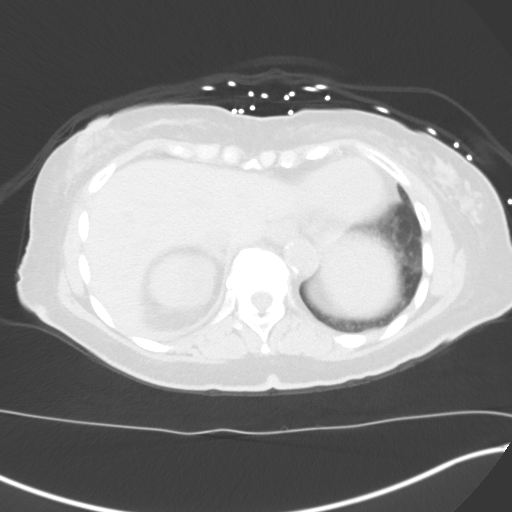
[im 74/80  soft-tissue]
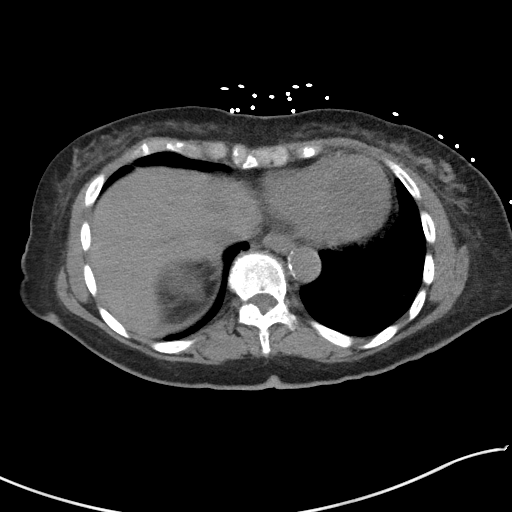
[im 74/80  lung]
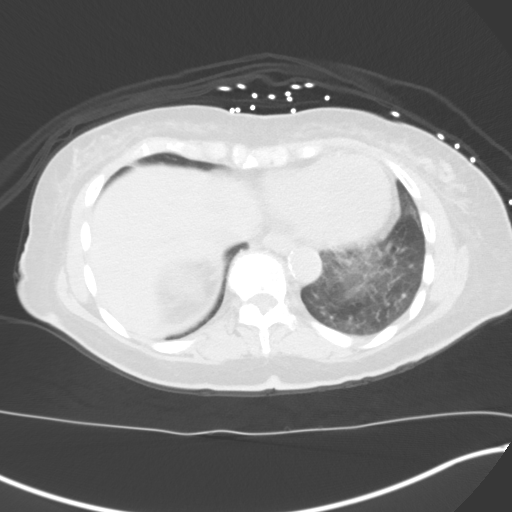
[im 77/80  lung]
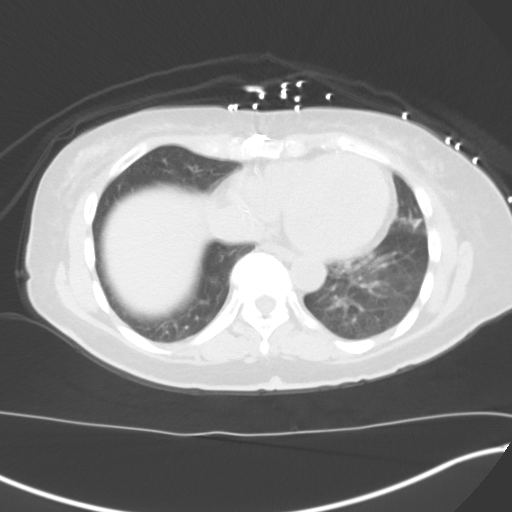

[15 of 32 positions shown; findings below may reference images not displayed]

FINDINGS: Lower chest:  Coronary atherosclerosis.  No acute finding

Hepatobiliary: No focal liver abnormality.No evidence of biliary
obstruction or stone.

Pancreas: Unremarkable.

Spleen: Occult mass when compared with MRI.

Interval subcapsular hemorrhage deforming the cortex and
decompressing into the inferior perirenal space, with overall
hematoma dimensions of 14 x 4.5 x 5.8 cm. A superimposed mass is
largely obscured and is included in this measurement. Negative left
kidney. Unremarkable bladder.

Stomach/Bowel:  No obstruction. No appendicitis.

Vascular/Lymphatic: No acute vascular abnormality. No mass or
adenopathy.

Reproductive:Hysterectomy.

Other: No ascites or pneumoperitoneum.

Musculoskeletal: No acute abnormalities. Lower lumbar facet
arthropathy with L4-5 anterolisthesis.

These results were called by telephone at the time of interpretation
on [DATE] at [DATE] to provider SUK , who
verbally acknowledged these results.
IMPRESSION: ~14 x 4.5 x 6 cm right subcapsular and perinephric hematoma. An
underlying mass is partially obscured and included in this volume.

## 2019-08-02 MED ORDER — ACETAMINOPHEN 325 MG PO TABS: 650.0000 mg | ORAL_TABLET | Freq: Four times a day (QID) | ORAL | Status: DC | PRN

## 2019-08-02 MED ORDER — METOPROLOL SUCCINATE ER 25 MG PO TB24
25.0000 mg | ORAL_TABLET | Freq: Every day | ORAL | Status: DC
  Administered 2019-08-02 – 2019-08-06 (×5): 25 mg via ORAL
  Filled 2019-08-02 (×5): qty 1

## 2019-08-02 MED ORDER — MORPHINE SULFATE (PF) 2 MG/ML IV SOLN: 1.0000 mg | INTRAVENOUS | Status: DC | PRN

## 2019-08-02 MED ORDER — ACETAMINOPHEN 650 MG RE SUPP: 650.0000 mg | Freq: Four times a day (QID) | RECTAL | Status: DC | PRN

## 2019-08-02 MED ORDER — ALBUTEROL SULFATE (2.5 MG/3ML) 0.083% IN NEBU: 2.5000 mg | INHALATION_SOLUTION | Freq: Four times a day (QID) | RESPIRATORY_TRACT | Status: DC | PRN

## 2019-08-02 MED ORDER — SODIUM CHLORIDE 0.9 % IV SOLN: INTRAVENOUS | Status: DC

## 2019-08-02 MED ORDER — ONDANSETRON HCL 4 MG PO TABS: 4.0000 mg | ORAL_TABLET | Freq: Four times a day (QID) | ORAL | Status: DC | PRN

## 2019-08-02 MED ORDER — POTASSIUM CHLORIDE 10 MEQ/100ML IV SOLN: 10.0000 meq | INTRAVENOUS | Status: DC

## 2019-08-02 MED ORDER — METHIMAZOLE 5 MG PO TABS
5.0000 mg | ORAL_TABLET | Freq: Every day | ORAL | Status: DC
  Administered 2019-08-02 – 2019-08-06 (×5): 5 mg via ORAL
  Filled 2019-08-02 (×5): qty 1

## 2019-08-02 MED ORDER — AMLODIPINE BESYLATE 5 MG PO TABS
5.0000 mg | ORAL_TABLET | Freq: Every day | ORAL | Status: DC
  Administered 2019-08-02 – 2019-08-06 (×5): 5 mg via ORAL
  Filled 2019-08-02 (×5): qty 1

## 2019-08-02 MED ORDER — DONEPEZIL HCL 10 MG PO TABS
10.0000 mg | ORAL_TABLET | Freq: Every day | ORAL | Status: DC
  Administered 2019-08-02 – 2019-08-05 (×4): 10 mg via ORAL
  Filled 2019-08-02 (×5): qty 1

## 2019-08-02 MED ORDER — SODIUM CHLORIDE 0.9% FLUSH
3.0000 mL | Freq: Two times a day (BID) | INTRAVENOUS | Status: DC
  Administered 2019-08-05 – 2019-08-06 (×3): 3 mL via INTRAVENOUS

## 2019-08-02 MED ORDER — ONDANSETRON HCL 4 MG/2ML IJ SOLN: 4.0000 mg | Freq: Four times a day (QID) | INTRAMUSCULAR | Status: DC | PRN

## 2019-08-02 MED ORDER — ONDANSETRON HCL 4 MG/2ML IJ SOLN
4.0000 mg | Freq: Once | INTRAMUSCULAR | Status: AC
  Administered 2019-08-02: 03:00:00 4 mg via INTRAVENOUS
  Filled 2019-08-02: qty 2

## 2019-08-02 MED ORDER — SODIUM CHLORIDE 0.9 % IV BOLUS
500.0000 mL | Freq: Once | INTRAVENOUS | Status: AC
  Administered 2019-08-02: 03:00:00 500 mL via INTRAVENOUS

## 2019-08-02 NOTE — ED Notes (Signed)
ON Pt arrival to room 2 in ED Pt 's foley was observed looped out the waist band of long pants. While removing Pt's pants a scarf was wraped  The Pt's waist under her long pants. While trying to remove scarf the foley cath fell out.  External urinary device placed on Pt.

## 2019-08-02 NOTE — ED Provider Notes (Signed)
Spoke with Dr. Tamala Julian who will admit the patient for the hospitalist service.  Spoke with Dr. Vernard Gambles who is aware of the patient will come see her and also spoke with Dr. Royce Macadamia who reports that she is available for consult as needed.   Blanchie Dessert, MD 08/02/19 [phone]

## 2019-08-02 NOTE — ED Notes (Signed)
Pt found wandering around in room. Pt had climbed out of bed and removed IV and purewick. Pt A&Ox2, denies pain at present. Pt placed in hospital gown, IV placed to L forearm

## 2019-08-02 NOTE — ED Notes (Signed)
Attempted to call report to [facility] ED x 2 with no answer

## 2019-08-02 NOTE — ED Triage Notes (Addendum)
[facility] - pt c/o abd pain that started yesterday evening. Emesis x 1. Pt had a recent kidney biopsy.

## 2019-08-02 NOTE — Consult Note (Signed)
[facility] Progress Note  [name] PCP: [name], FNP Admit Date: 08/02/2019 Requesting Physician: Norval Morton, MD Reason for Consult:  Acute renal failure  Assessment/ Plan:    1. 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 Lowanda Foster at [place], last seen via telemed on 09/14/18. Patient arrives to ED with acutely elevated creatinine to 6.75 and BUN of 96.  Her most recent labs in the last year show creatinine of 1.6 and BUN 26-38. Urine with small hgb on dipstick, 30 protein, rare bacteria, hyaline casts. No recent contrast, NSAIDs, bactrim, hypotension. Patient had renal biopsy performed on 07/27/19. Today, found to have large hematoma on CT. Concern for page kidney. Ultimately, patient is not a candidate for dialysis given her dementia. IR consulted for arteriogram; however, given need for contrast, trying to avoid. Urology consulted for possible nephrectomy at [facility] and confirmed active consult with provider on call. Currently, patient is stable and does not complain of any significant pain.  -avoid morphine -d/c'ed K supplementation until repeat RFP obtained  -await input from urology & IR  2. Renal Cell Carcinoma, papillary, type I, nuclear grade 3  Diagnosed via recent CT guided biopsy on 07/27/19.  3. Anemia, acute Acute blood loss of 13.9 to 9.6 on admission. Some likely iatrogenic, but also concerning in setting of hematoma. Continue to monitor- per primary team  -Hold home ASA  4. Hypokalemia  5. Dementia - continue home aricept 10mg   6. Hypertension  7. HLD 8. Multinodular Goiter - methimazole  9. CAD - NSTEMI s/p PCI 06/2017, CABG 2006 10. PET 02/2019: 4 cm splenic mass w/o uptake, likely benign. No metastatic disease   Medications:   Home Meds:  Current Outpatient Medications  Medication Instructions  . amLODipine (NORVASC) 5 MG tablet TAKE ONE TABLET BY MOUTH ONCE DAILY.  Marland Kitchen aspirin EC 81 mg, Oral,  Daily  . atorvastatin (LIPITOR) 80 MG tablet TAKE 1 TABLET BY MOUTH ONCE DAILY.  . calcium carbonate (OSCAL) 1,500 mg, Oral, Daily with breakfast  . cholecalciferol (VITAMIN D) 1,000 Units, Oral, Daily  . donepezil (ARICEPT) 10 mg, Oral, Daily at bedtime  . methimazole (TAPAZOLE) 5 mg, Oral, Daily  . metoprolol succinate (TOPROL XL) 25 mg, Oral, Daily  . nitroGLYCERIN (NITROSTAT) 0.4 mg, Sublingual, Every 5 min x3 PRN   Inpatient Meds:   Subjective:   HPI:  Patient transferred from [facility] for admission in the setting of AKI.  At bedside, patient's son, [name], was able to give [place] some information given patient's mental status.  [name] reports that he does not live with his mother but would be able to get more information from her granddaughter and daughter listed in the emergency contacts.  Son isunaware if she has had any recent diarrhea or other abdominal pain.  He does not believe that she is started any new medications recently.   ED Course:  At any pain ED, patient was given 2 half liter boluses and Zofran.  She was ultimately transferred to [facility] early around 0900 where she was noted to not be in any pain.  Patient's being admitted by hospitalist team with several  PMH  Past Medical History:  Diagnosis Date  . Arthritis   . Cancer of kidney ([place])   . Chronic kidney disease, stage 3, mod decreased GFR   . Coronary atherosclerosis of native coronary artery 2006   Multivessel status post CABG in [place],  graft disease documented March 2019 with DES to SVG to diagonal  . Essential hypertension   . 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 History of diabetes mellitus, type II   . Hyperlipidemia   . NSTEMI (non-ST elevated myocardial infarction) ([place]) 06/24/2017  . Right renal mass    PSH  Past Surgical History:  Procedure Laterality Date  . ABDOMINAL HYSTERECTOMY    . CORONARY ARTERY BYPASS GRAFT  2006   [place], [place]  . CORONARY STENT INTERVENTION N/A  06/25/2017   Procedure: CORONARY STENT INTERVENTION;  Surgeon: Jettie Booze, MD;  Location: [place] [facility];  Service: Cardiovascular;  Laterality: N/A;  . IR RADIOLOGIST EVAL & MGMT  07/08/2019  . LEFT HEART CATH AND CORS/GRAFTS ANGIOGRAPHY N/A 06/25/2017   Procedure: LEFT HEART CATH AND CORS/GRAFTS ANGIOGRAPHY;  Surgeon: Jettie Booze, MD;  Location: [place] [facility];  Service: Cardiovascular;  Laterality: N/A;   FH  Family History  Problem Relation Age of Onset  . Heart attack Mother   . Hypertension Mother   . Seizures Son   . Seizures Son   . Seizures Daughter    SH  reports that she has never smoked. She has never used smokeless tobacco. She reports that she does not drink alcohol or use drugs.  Patient lives with daughter and granddaughter.  At baseline, she is able to ambulate and get around without any assistive device. Allergies No Known Allergies  Objective:   Physical Exam   BP (!) 158/78 (BP Location: Right Arm)   Pulse 92   Temp (!) 96.9 F (36.1 C) (Oral)   Resp 15   SpO2 100%  There were no vitals filed for this visit.  GEN: Well-appearing elderly female in no acute distress. ENT: No facial edema appreciated.  No cranial nerve deficits appreciated. EYES: Arcus senilis bilaterally.   CV: 2/6 systolic ejection murmur.  Minimal S1 with regular S2.  No bilateral lower extremity edema.  Regular rate and rhythm.  Vertical scar on chest PULM: Clear to auscultation bilaterally.  No respiratory distress ABD: Soft, nontender to palpation.  Nondistended.  Positive bowel sounds SKIN: Warm and well perfused EXT: Moving spontaneously no focal neurologic deficits  Labs: CBC Recent Labs  Lab 07/27/19 1005 08/02/19 0252  WBC 6.3 10.4  NEUTROABS  --  7.9*  HGB 13.9 9.6*  HCT 41.1 28.7*  MCV 91.9 92.9  PLT 199 081   Basic Metabolic Panel Recent Labs  Lab 07/27/19 1005 08/02/19 0252  NA 141 136  K 3.4* 3.0*  CL 106 101  CO2 26 20*  GLUCOSE 109*  186*  BUN 26* 96*  CREATININE 1.60* 6.75*  CALCIUM 9.6 8.6*    Creatinine, Ser (mg/dL)  Date Value  08/02/2019 6.75 (H)  07/27/2019 1.60 (H)  01/29/2019 1.61 (H)  04/24/2018 2.29 (H)  07/01/2017 1.83 (H)  06/27/2017 2.03 (H)  06/26/2017 1.45 (H)  06/25/2017 1.24 (H)  06/25/2017 1.20 (H)  06/25/2017 1.33 (H)    Pertinent Imaging:   CT ABDOMEN PELVIS WO CONTRAST  Result Date: 08/02/2019 CLINICAL DATA:  Recent kidney biopsy with sharp abdominal pain. EXAM: CT ABDOMEN AND PELVIS WITHOUT CONTRAST TECHNIQUE: Multidetector CT imaging of the abdomen and pelvis was performed following the standard protocol without IV contrast. COMPARISON:  Six days ago FINDINGS: Lower chest:  Coronary atherosclerosis.  No acute finding Hepatobiliary: No focal liver abnormality.No evidence of biliary obstruction or stone. Pancreas: Unremarkable. Spleen: Occult mass when compared with MRI. Interval subcapsular hemorrhage  deforming the cortex and decompressing into the inferior perirenal space, with overall hematoma dimensions of 14 x 4.5 x 5.8 cm. A superimposed mass is largely obscured and is included in this measurement. Negative left kidney. Unremarkable bladder. Stomach/Bowel:  No obstruction. No appendicitis. Vascular/Lymphatic: No acute vascular abnormality. No mass or adenopathy. Reproductive:Hysterectomy. Other: No ascites or pneumoperitoneum. Musculoskeletal: No acute abnormalities. Lower lumbar facet arthropathy with L4-5 anterolisthesis. These results were called by telephone at the time of interpretation on 08/02/2019 at 4:37 am to provider [facility] , who verbally acknowledged these results. IMPRESSION: ~14 x 4.5 x 6 cm right subcapsular and perinephric hematoma. An underlying mass is partially obscured and included in this volume. Electronically Signed   By: Monte Fantasia M.D.   On: 08/02/2019 04:39    Zettie Cooley, MD [place] Resident, PGY2 08/02/2019, 12:05 PM

## 2019-08-02 NOTE — ED Provider Notes (Addendum)
[facility] EMERGENCY DEPARTMENT Provider Note   CSN: [id number] Arrival date & time: 08/02/19  0211     History Chief Complaint  Patient presents with  . Abdominal Pain    Erin Jordan is a 74 y.o. female.  Patient comes to the emergency department for evaluation of abdominal pain.  Symptoms have been present for 1 day.  Pain has been severe across the entire abdomen.  She has had nausea and vomited one time.  No associated diarrhea.  She has not had any fever.  Patient denies chest pain, shortness of breath.        Past Medical History:  Diagnosis Date  . Arthritis   . Cancer of kidney ([place])   . Chronic kidney disease, stage 3, mod decreased GFR   . Coronary atherosclerosis of native coronary artery 2006   Multivessel status post CABG in [place], graft disease documented March 2019 with DES to SVG to diagonal  . Essential hypertension   . 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 History of diabetes mellitus, type II   . Hyperlipidemia   . NSTEMI (non-ST elevated myocardial infarction) ([place]) 06/24/2017  . Right renal mass     Patient Active Problem List   Diagnosis Date Noted  . RLS (restless legs syndrome) 01/25/2019  . Right renal mass 01/25/2019  . Splenic mass 01/25/2019  . Blood in stool 01/25/2019  . Dementia without behavioral disturbance ([place]) 01/25/2019  . Hyperthyroidism 09/14/2018  . Osteopenia 05/20/2018  . Multinodular goiter 10/14/2017  . Weight loss, unintentional 07/14/2017  . CAD -S/P PCI 06/26/17 06/27/2017  . 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 Hx of CABG- 2006   . History of diabetes mellitus   . Essential hypertension   . Stage 3 chronic kidney disease   . Dyslipidemia, goal LDL below 70 06/02/2011    Past Surgical History:  Procedure Laterality Date  . ABDOMINAL HYSTERECTOMY    . CORONARY ARTERY BYPASS GRAFT  2006   [place], [place]  . CORONARY STENT INTERVENTION N/A 06/25/2017   Procedure: CORONARY STENT  INTERVENTION;  Surgeon: Jettie Booze, MD;  Location: [place] [facility];  Service: Cardiovascular;  Laterality: N/A;  . IR RADIOLOGIST EVAL & MGMT  07/08/2019  . LEFT HEART CATH AND CORS/GRAFTS ANGIOGRAPHY N/A 06/25/2017   Procedure: LEFT HEART CATH AND CORS/GRAFTS ANGIOGRAPHY;  Surgeon: Jettie Booze, MD;  Location: [place] [facility];  Service: Cardiovascular;  Laterality: N/A;     OB History   No obstetric history on file.     Family History  Problem Relation Age of Onset  . Heart attack Mother   . Hypertension Mother   . Seizures Son   . Seizures Son   . Seizures Daughter     Social History   Tobacco Use  . Smoking status: Never Smoker  . Smokeless tobacco: Never Used  Substance Use Topics  . Alcohol use: No  . Drug use: No    Home Medications Prior to Admission medications   Medication Sig Start Date End Date Taking? Authorizing Provider  amLODipine (NORVASC) 5 MG tablet TAKE ONE TABLET BY MOUTH ONCE DAILY. Patient taking differently: Take 5 mg by mouth daily.  06/28/19   [name], FNP  aspirin EC 81 MG tablet Take 1 tablet (81 mg total) by mouth daily. 07/14/17   Imogene Burn, PA-C  atorvastatin (LIPITOR) 80 MG tablet TAKE 1 TABLET BY MOUTH ONCE DAILY. Patient taking differently: Take 80 mg by  mouth daily.  06/28/19   [name], FNP  calcium carbonate (OSCAL) 1500 (600 Ca) MG TABS tablet Take 1,500 mg by mouth daily with breakfast.     [provider]  cholecalciferol (VITAMIN D) 25 MCG (1000 UT) tablet Take 1,000 Units by mouth daily.     [provider]  donepezil (ARICEPT) 10 MG tablet Take 1 tablet (10 mg total) by mouth at bedtime. 03/24/19   [name], FNP  methimazole (TAPAZOLE) 5 MG tablet Take 1 tablet (5 mg total) by mouth daily. 01/25/19   [name], FNP  metoprolol succinate (TOPROL XL) 25 MG 24 hr tablet Take 1 tablet (25 mg total) by mouth daily. 06/24/19   Strader, Fransisco Hertz, PA-C  nitroGLYCERIN  (NITROSTAT) 0.4 MG SL tablet Place 1 tablet (0.4 mg total) under the tongue every 5 (five) minutes x 3 doses as needed for chest pain. 07/07/19   Satira Sark, MD    Allergies    Patient has no known allergies.  Review of Systems   Review of Systems  Gastrointestinal: Positive for abdominal pain, nausea and vomiting.  All other systems reviewed and are negative.   Physical Exam Updated Vital Signs BP 114/62   Pulse 74   Temp 97.6 F (36.4 C) (Oral)   Resp 13   SpO2 100%   Physical Exam Vitals and nursing note reviewed.  Constitutional:      General: She is not in acute distress.    Appearance: Normal appearance. She is well-developed.  HENT:     Head: Normocephalic and atraumatic.     Right Ear: Hearing normal.     Left Ear: Hearing normal.     Nose: Nose normal.  Eyes:     Conjunctiva/sclera: Conjunctivae normal.     Pupils: Pupils are equal, round, and reactive to light.  Cardiovascular:     Rate and Rhythm: Regular rhythm.     Heart sounds: S1 normal and S2 normal. No murmur. No friction rub. No gallop.   Pulmonary:     Effort: Pulmonary effort is normal. No respiratory distress.     Breath sounds: Normal breath sounds.  Chest:     Chest wall: No tenderness.  Abdominal:     General: Bowel sounds are normal.     Palpations: Abdomen is soft.     Tenderness: There is generalized abdominal tenderness. There is no guarding or rebound. Negative signs include Murphy's sign and McBurney's sign.     Hernia: No hernia is present.  Musculoskeletal:        General: Normal range of motion.     Cervical back: Normal range of motion and neck supple.  Skin:    General: Skin is warm and dry.     Findings: No rash.  Neurological:     Mental Status: She is alert and oriented to person, place, and time.     GCS: GCS eye subscore is 4. GCS verbal subscore is 5. GCS motor subscore is 6.     Cranial Nerves: No cranial nerve deficit.     Sensory: No sensory deficit.      Coordination: Coordination normal.  Psychiatric:        Speech: Speech normal.        Behavior: Behavior normal.        Thought Content: Thought content normal.     ED Results / Procedures / Treatments   Labs (all labs ordered are listed, but only abnormal results are displayed)  Labs Reviewed  CBC WITH DIFFERENTIAL/PLATELET - Abnormal; Notable for the following components:      Result Value   RBC 3.09 (*)    Hemoglobin 9.6 (*)    HCT 28.7 (*)    Neutro Abs 7.9 (*)    Monocytes Absolute 1.3 (*)    All other components within normal limits  COMPREHENSIVE METABOLIC PANEL - Abnormal; Notable for the following components:   Potassium 3.0 (*)    CO2 20 (*)    Glucose, Bld 186 (*)    BUN 96 (*)    Creatinine, Ser 6.75 (*)    Calcium 8.6 (*)    Albumin 3.4 (*)    GFR calc non Af Amer 5 (*)    GFR calc Af Amer 6 (*)    All other components within normal limits  LIPASE, BLOOD - Abnormal; Notable for the following components:   Lipase 290 (*)    All other components within normal limits  RESPIRATORY PANEL BY RT PCR (FLU A&B, COVID)  LACTIC ACID, PLASMA  URINALYSIS, ROUTINE W REFLEX MICROSCOPIC    EKG EKG Interpretation  Date/Time:  Monday August 02 2019 02:52:03 EDT Ventricular Rate:  78 PR Interval:    QRS Duration: 106 QT Interval:  411 QTC Calculation: 469 R Axis:   37 Text Interpretation: Sinus rhythm Nonspecific T abnrm, anterolateral leads No significant change since last tracing Confirmed by Orpah Greek [id number]) on 08/02/2019 3:02:35 AM   Radiology CT ABDOMEN PELVIS WO CONTRAST  Result Date: 08/02/2019 CLINICAL DATA:  Recent kidney biopsy with sharp abdominal pain. EXAM: CT ABDOMEN AND PELVIS WITHOUT CONTRAST TECHNIQUE: Multidetector CT imaging of the abdomen and pelvis was performed following the standard protocol without IV contrast. COMPARISON:  Six days ago FINDINGS: Lower chest:  Coronary atherosclerosis.  No acute finding Hepatobiliary: No focal liver  abnormality.No evidence of biliary obstruction or stone. Pancreas: Unremarkable. Spleen: Occult mass when compared with MRI. Interval subcapsular hemorrhage deforming the cortex and decompressing into the inferior perirenal space, with overall hematoma dimensions of 14 x 4.5 x 5.8 cm. A superimposed mass is largely obscured and is included in this measurement. Negative left kidney. Unremarkable bladder. Stomach/Bowel:  No obstruction. No appendicitis. Vascular/Lymphatic: No acute vascular abnormality. No mass or adenopathy. Reproductive:Hysterectomy. Other: No ascites or pneumoperitoneum. Musculoskeletal: No acute abnormalities. Lower lumbar facet arthropathy with L4-5 anterolisthesis. These results were called by telephone at the time of interpretation on 08/02/2019 at 4:37 am to provider [facility] , who verbally acknowledged these results. IMPRESSION: ~14 x 4.5 x 6 cm right subcapsular and perinephric hematoma. An underlying mass is partially obscured and included in this volume. Electronically Signed   By: Monte Fantasia M.D.   On: 08/02/2019 04:39    Procedures Procedures (including critical care time)  Medications Ordered in ED Medications  sodium chloride 0.9 % bolus 500 mL (0 mLs Intravenous Stopped 08/02/19 0356)  ondansetron (ZOFRAN) injection 4 mg (4 mg Intravenous Given 08/02/19 0252)    ED Course  I have reviewed the triage vital signs and the nursing notes.  Pertinent labs & imaging results that were available during my care of the patient were reviewed by me and considered in my medical decision making (see chart for details).    MDM Rules/Calculators/A&P                      Patient presents to the emergency department for evaluation of abdominal pain.  Patient experiencing pain across the upper  abdomen diffusely.  She has had vomiting one time.  Patient's initial blood work revealed acute renal failure.  A CT scan was performed.  Because of her renal failure, contrast  could not be given.  There is a subcapsular and perinephric hematoma present.  This is possibly secondary to the biopsy or spontaneous bleed secondary to the mass.  Patient is currently hemodynamically stable.  Her hemoglobin has dropped from 13.9 on April 20th to 9.6 today.  I did discuss this briefly with Dr. Jeffie Pollock, on-call for urology.  He did not feel that this would become a surgical process, recommends that interventional radiology be consulted.  Case was discussed with Dr. Laurence Ferrari, on-call for interventional radiology.  Recommends transferring the patient to [facility].  With patient's acute renal failure, likely cannot get contrast to determine if there is active bleeding or to perform embolization procedure.  Will first require nephrology consultation to determine if she needs dialysis and can get contrast.  Discussed with Dr. Jonnie Finner, on-call for nephrology. Agrees with transferring the patient to [facility] for nephrology evaluation. Does not feel that she needs to be emergently transferred ED to ED if there are beds available. Will ask hospitalist to admit.  Addendum: Confirmed with [facility] that there are no MedSurg beds available at [facility].  Discussed with Dr. Scherrie November, hospitalist at [facility]. Asks for ED to ED transfer and have hospitalist at [facility] do admission. Discussed with Dr. Leonette Monarch, ER physician at [facility].  Will accept patient for ED to ED transfer.  I did discuss the patient's current condition including kidney hematoma and acute renal failure with her granddaughter Erin Jordan [phone]) and informed her that I will be transferring the patient to [facility].  She is in agreement with this plan.  CRITICAL CARE Performed by: Orpah Greek   Total critical care time: 40 minutes  Critical care time was exclusive of separately billable procedures and treating other patients.  Critical care was necessary to treat or prevent imminent or life-threatening  deterioration.  Critical care was time spent personally by me on the following activities: development of treatment plan with patient and/or surrogate as well as nursing, discussions with consultants, evaluation of patient's response to treatment, examination of patient, obtaining history from patient or surrogate, ordering and performing treatments and interventions, ordering and review of laboratory studies, ordering and review of radiographic studies, pulse oximetry and re-evaluation of patient's condition.   Final Clinical Impression(s) / ED Diagnoses Final diagnoses:  Acute renal failure, unspecified acute renal failure type [facility])    Rx / DC Orders ED Discharge Orders    None       Jadore Mcguffin, Gwenyth Allegra, MD 08/02/19 [id number]    Orpah Greek, MD 08/02/19 [id number]    Orpah Greek, MD 08/02/19 [phone]

## 2019-08-02 NOTE — H&P (Addendum)
History and Physical    Erin Jordan:[id number] DOB: [birth year] DOA: 08/02/2019  Referring MD/NP/PA: Blanchie Dessert, MD Outside consultants:Dr. Lowanda Foster (nephrology) PCP: [name], FNP  Patient coming from: home via EMS transferred from [facility] ED to ED  Chief Complaint: Abdominal pain  I have personally briefly reviewed patient's old medical records in [place]   HPI: Erin Jordan is a 74 y.o. female with medical history significant of hypertension, hyperlipidemia, CKD stage III, dementia, and right renal mass presented with complaints of right-sided abdominal pain.  History is obtained from the patient with the assistance of her son who is present at bedside as he patient has some issues with memory.  Apparently, patient felt something pop on her right side last night.  She describes having a sharp pain felt like a pin sticking her.  Possibly notes having decreased urine output.  She had just recently had a CT-guided biopsy of a mass of her right side of her kidney 6 days ago.  Following the procedure patient and initially not had any issues.  Once home she had been doing fine until yesterday evening.  Denies having any recent falls or trauma.  Review of records from pathology report revealed renal cell carcinoma.  ED Course: Upon admission into the emergency department patient was seen to be afebrile, blood pressure 114/62-160/69, and all other vital signs within normal limits.  Labs significant for hemoglobin 9.6 (hemoglobin 13.9 on 4/20), creatinine 6.75(creatinine 1.6 4/20), BUN 96, and lipase 290.  COVID-19 and influenza screening negative.  Patient was given 500 mL of normal saline IV fluids.  IR and nephrology have been formally consulted.  Patient was transferred from [facility] to [facility] for need of IR services.  TRH called to admit.   Review of Systems  Unable to perform ROS: Dementia  Constitutional: Negative for fever.  Gastrointestinal: Positive  for abdominal pain, nausea and vomiting.  Musculoskeletal: Negative for falls.  Psychiatric/Behavioral: Positive for memory loss.    Past Medical History:  Diagnosis Date  . Arthritis   . Cancer of kidney ([place])   . Chronic kidney disease, stage 3, mod decreased GFR   . Coronary atherosclerosis of native coronary artery 2006   Multivessel status post CABG in [place], graft disease documented March 2019 with DES to SVG to diagonal  . Essential hypertension   . 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 History of diabetes mellitus, type II   . Hyperlipidemia   . NSTEMI (non-ST elevated myocardial infarction) ([place]) 06/24/2017  . Right renal mass     Past Surgical History:  Procedure Laterality Date  . ABDOMINAL HYSTERECTOMY    . CORONARY ARTERY BYPASS GRAFT  2006   [place], [place]  . CORONARY STENT INTERVENTION N/A 06/25/2017   Procedure: CORONARY STENT INTERVENTION;  Surgeon: Jettie Booze, MD;  Location: [facility];  Service: Cardiovascular;  Laterality: N/A;  . IR RADIOLOGIST EVAL & MGMT  07/08/2019  . LEFT HEART CATH AND CORS/GRAFTS ANGIOGRAPHY N/A 06/25/2017   Procedure: LEFT HEART CATH AND CORS/GRAFTS ANGIOGRAPHY;  Surgeon: Jettie Booze, MD;  Location: [facility];  Service: Cardiovascular;  Laterality: N/A;     reports that she has never smoked. She has never used smokeless tobacco. She reports that she does not drink alcohol or use drugs.  No Known Allergies  Family History  Problem Relation Age of Onset  . Heart attack Mother   . Hypertension Mother   .  Seizures Son   . Seizures Son   . Seizures Daughter     Prior to Admission medications   Medication Sig Start Date End Date Taking? Authorizing Provider  amLODipine (NORVASC) 5 MG tablet TAKE ONE TABLET BY MOUTH ONCE DAILY. 08/02/19   [name], FNP  aspirin EC 81 MG tablet Take 1 tablet (81 mg total) by mouth daily. 07/14/17   Imogene Burn, PA-C  atorvastatin (LIPITOR)  80 MG tablet TAKE 1 TABLET BY MOUTH ONCE DAILY. Patient taking differently: Take 80 mg by mouth daily.  06/28/19   [name], FNP  calcium carbonate (OSCAL) 1500 (600 Ca) MG TABS tablet Take 1,500 mg by mouth daily with breakfast.     [provider]  cholecalciferol (VITAMIN D) 25 MCG (1000 UT) tablet Take 1,000 Units by mouth daily.     [provider]  donepezil (ARICEPT) 10 MG tablet Take 1 tablet (10 mg total) by mouth at bedtime. 03/24/19   [name], FNP  methimazole (TAPAZOLE) 5 MG tablet Take 1 tablet (5 mg total) by mouth daily. 01/25/19   [name], FNP  metoprolol succinate (TOPROL XL) 25 MG 24 hr tablet Take 1 tablet (25 mg total) by mouth daily. 06/24/19   Strader, Fransisco Hertz, PA-C  nitroGLYCERIN (NITROSTAT) 0.4 MG SL tablet Place 1 tablet (0.4 mg total) under the tongue every 5 (five) minutes x 3 doses as needed for chest pain. 07/07/19   Satira Sark, MD    Physical Exam:  Constitutional: Elderly female who currently appears to be in no acute distress at this time Vitals:   08/02/19 0627 08/02/19 0630 08/02/19 0917 08/02/19 1047  BP:  (!) 144/65 (!) 160/69 (!) 158/78  Pulse: 87 88 85 92  Resp: 17 13 16 15   Temp:   97.7 F (36.5 C) (!) 96.9 F (36.1 C)  TempSrc:   Oral Oral  SpO2: 100% 100% 100% 100%   Eyes: PERRL, lids and conjunctivae normal ENMT: Mucous membranes are moist. Posterior pharynx clear of any exudate or lesions.  Multiple missing teeth. Neck: normal, supple, no masses, no thyromegaly Respiratory: clear to auscultation bilaterally, no wheezing, no crackles. Normal respiratory effort. No accessory muscle use.  Cardiovascular: Regular rate and rhythm, no murmurs / rubs / gallops. No extremity edema. 2+ pedal pulses. No carotid bruits.  Abdomen: no tenderness, no masses palpated. No hepatosplenomegaly. Bowel sounds positive.  Musculoskeletal: no clubbing / cyanosis. No joint deformity upper and lower extremities. Good  ROM, no contractures. Normal muscle tone.  Skin: no rashes, lesions, ulcers. No induration Neurologic: CN 2-12 grossly intact. Sensation intact, DTR normal. Strength 5/5 in all 4.  Psychiatric: Poor recent memory.  Alert and oriented to person and place.  Normal mood.    Labs on Admission: I have personally reviewed following labs and imaging studies  CBC: Recent Labs  Lab 07/27/19 1005 08/02/19 0252  WBC 6.3 10.4  NEUTROABS  --  7.9*  HGB 13.9 9.6*  HCT 41.1 28.7*  MCV 91.9 92.9  PLT 199 008   Basic Metabolic Panel: Recent Labs  Lab 07/27/19 1005 08/02/19 0252  NA 141 136  K 3.4* 3.0*  CL 106 101  CO2 26 20*  GLUCOSE 109* 186*  BUN 26* 96*  CREATININE 1.60* 6.75*  CALCIUM 9.6 8.6*   GFR: Estimated Creatinine Clearance: 5.7 mL/min (A) (by C-G formula based on SCr of 6.75 mg/dL (H)). Liver Function Tests: Recent Labs  Lab 08/02/19 0252  AST 26  ALT 26  ALKPHOS 94  BILITOT 1.0  PROT 6.8  ALBUMIN 3.4*   Recent Labs  Lab 08/02/19 0252  LIPASE 290*   No results for input(s): AMMONIA in the last 168 hours. Coagulation Profile: Recent Labs  Lab 07/27/19 1005  INR 1.0   Cardiac Enzymes: No results for input(s): CKTOTAL, CKMB, CKMBINDEX, TROPONINI in the last 168 hours. BNP (last 3 results) No results for input(s): PROBNP in the last 8760 hours. HbA1C: No results for input(s): HGBA1C in the last 72 hours. CBG: No results for input(s): GLUCAP in the last 168 hours. Lipid Profile: No results for input(s): CHOL, HDL, LDLCALC, TRIG, CHOLHDL, LDLDIRECT in the last 72 hours. Thyroid Function Tests: No results for input(s): TSH, T4TOTAL, FREET4, T3FREE, THYROIDAB in the last 72 hours. Anemia Panel: No results for input(s): VITAMINB12, FOLATE, FERRITIN, TIBC, IRON, RETICCTPCT in the last 72 hours. Urine analysis:    Component Value Date/Time   COLORURINE YELLOW 08/02/2019 0231   APPEARANCEUR CLEAR 08/02/2019 0231   LABSPEC 1.009 08/02/2019 0231   PHURINE  5.0 08/02/2019 0231   GLUCOSEU NEGATIVE 08/02/2019 0231   HGBUR SMALL (A) 08/02/2019 0231   BILIRUBINUR NEGATIVE 08/02/2019 0231   BILIRUBINUR neg 05/19/2019 0947   KETONESUR NEGATIVE 08/02/2019 0231   PROTEINUR 30 (A) 08/02/2019 0231   UROBILINOGEN 0.2 05/19/2019 0947   UROBILINOGEN 0.2 06/01/2011 1822   NITRITE NEGATIVE 08/02/2019 0231   LEUKOCYTESUR NEGATIVE 08/02/2019 0231   Sepsis Labs: Recent Results (from the past 240 hour(s))  Respiratory Panel by RT PCR (Flu A&B, Covid) - Nasopharyngeal Swab     Status: None   Collection Time: 08/02/19  4:49 AM   Specimen: Nasopharyngeal Swab  Result Value Ref Range Status   SARS Coronavirus 2 by RT PCR NEGATIVE NEGATIVE Final    Comment: (NOTE) SARS-CoV-2 target nucleic acids are NOT DETECTED. The SARS-CoV-2 RNA is generally detectable in upper respiratoy specimens during the acute phase of infection. The lowest concentration of SARS-CoV-2 viral copies this assay can detect is 131 copies/mL. A negative result does not preclude SARS-Cov-2 infection and should not be used as the sole basis for treatment or other patient management decisions. A negative result may occur with  improper specimen collection/handling, submission of specimen other than nasopharyngeal swab, presence of viral mutation(s) within the areas targeted by this assay, and inadequate number of viral copies (<131 copies/mL). A negative result must be combined with clinical observations, patient history, and epidemiological information. The expected result is Negative. Fact Sheet for Patients:  PinkCheek.be Fact Sheet for Healthcare Providers:  GravelBags.it This test is not yet ap proved or cleared by the [place] FDA and  has been authorized for detection and/or diagnosis of SARS-CoV-2 by FDA under an Emergency Use Authorization (EUA). This EUA will remain  in effect (meaning this test can be used) for the  duration of the COVID-19 declaration under Section 564(b)(1) of the Act, 21 U.S.C. section 360bbb-3(b)(1), unless the authorization is terminated or revoked sooner.    Influenza A by PCR NEGATIVE NEGATIVE Final   Influenza B by PCR NEGATIVE NEGATIVE Final    Comment: (NOTE) The Xpert Xpress SARS-CoV-2/FLU/RSV assay is intended as an aid in  the diagnosis of influenza from Nasopharyngeal swab specimens and  should not be used as a sole basis for treatment. Nasal washings and  aspirates are unacceptable for Xpert Xpress SARS-CoV-2/FLU/RSV  testing. Fact Sheet for Patients: PinkCheek.be Fact Sheet for Healthcare Providers: GravelBags.it This test is not yet  approved or cleared by the [place] and  has been authorized for detection and/or diagnosis of SARS-CoV-2 by  FDA under an Emergency Use Authorization (EUA). This EUA will remain  in effect (meaning this test can be used) for the duration of the  Covid-19 declaration under Section 564(b)(1) of the Act, 21  U.S.C. section 360bbb-3(b)(1), unless the authorization is  terminated or revoked. Performed at [facility], [address] [place], [place] [address]      Radiological Exams on Admission: CT ABDOMEN PELVIS WO CONTRAST  Result Date: 08/02/2019 CLINICAL DATA:  Recent kidney biopsy with sharp abdominal pain. EXAM: CT ABDOMEN AND PELVIS WITHOUT CONTRAST TECHNIQUE: Multidetector CT imaging of the abdomen and pelvis was performed following the standard protocol without IV contrast. COMPARISON:  Six days ago FINDINGS: Lower chest:  Coronary atherosclerosis.  No acute finding Hepatobiliary: No focal liver abnormality.No evidence of biliary obstruction or stone. Pancreas: Unremarkable. Spleen: Occult mass when compared with MRI. Interval subcapsular hemorrhage deforming the cortex and decompressing into the inferior perirenal space, with overall hematoma dimensions of 14 x  4.5 x 5.8 cm. A superimposed mass is largely obscured and is included in this measurement. Negative left kidney. Unremarkable bladder. Stomach/Bowel:  No obstruction. No appendicitis. Vascular/Lymphatic: No acute vascular abnormality. No mass or adenopathy. Reproductive:Hysterectomy. Other: No ascites or pneumoperitoneum. Musculoskeletal: No acute abnormalities. Lower lumbar facet arthropathy with L4-5 anterolisthesis. These results were called by telephone at the time of interpretation on 08/02/2019 at 4:37 am to provider [facility] , who verbally acknowledged these results. IMPRESSION: ~14 x 4.5 x 6 cm right subcapsular and perinephric hematoma. An underlying mass is partially obscured and included in this volume. Electronically Signed   By: Monte Fantasia M.D.   On: 08/02/2019 04:39    EKG: Independently reviewed.  Sinus rhythm at 78 bpm  Assessment/Plan  Right kidney hematoma Post procedure complication Acute blood loss: Acute.  Patient presents with complaints of right side pain.  Found to have hemoglobin dropping from 13.9 down to 9.6 on admission.  Vital signs currently stable.  Imaging studies revealed 14 x 4.5 x 6 cm right subcapsular perinephric hematoma on previous biopsy kidney.  IR was consulted with initial plan to take patient for arteriogram, but consulted urology for need of right nephrectomy given renal cell carcinoma diagnosis to decrease contrast exposure.  -Admit to medical telemetry bed -N.p.o. -Type and screen for possible need of blood products  -Hold aspirin -Serial monitoring of H&H -Transfuse blood products as needed for blood counts less than 8 g/dL or patient becomes hemodynamically unstable. -Appreciate IR and urology consultative services, will follow-up for further recommendations  Acute renal failure superimposed on chronic kidney disease stage III: Patient baseline creatinine previously noted to be around 1.6, but presents with creatinine elevated up to  6.75 with BUN 96.  UA significant for small amount of hemoglobin with rare bacteria.  Suspect hematoma contributing to kidney dysfunction patient is followed in the outpatient setting by Dr. Lowanda Foster. -Add on [facility] nephrology consultative services,  will follow-up for further recommendations   Renal cell carcinoma: Acute.  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 -Appreciate urology consultative services,  will follow-up for further recommendation  Hypokalemia: Acute.  Potassium 3.0 on admission. -Give 40 mEq of potassium chloride IV x1 dose now -Continue to monitor and replace as needed  Diabetes mellitus type 2 : Glucose elevated up to 186 on  admission.  Patient with previous history of elevated hemoglobin A1c of 6.7 back in 2013, but patient not on any oral hypoglycemic agents. -Check hemoglobin A1c in a.m.  Hyperthyroidism: Chronic.  Patient well controlled on methimazole per last check in 03/2019. -Continue methimazole  Essential hypertension: Home blood pressure medications include amlodipine 5 mg daily, metoprolol succinate 25 mg daily. -Continue home blood pressure regimen  History of CAD: Status post CABG in 2006 and PCI in 2019. -Held aspirin due to hematoma  Dyslipidemia: Home medications include atorvastatin 80 mg daily. -Consider continuing statin if CK within normal limits  Dementia: Home medications include Aricept 10 mg daily. -Continue Aricept  DVT prophylaxis: SCDs   Code Status: Full Family Communication: Patient son updated at bedside Disposition Plan: Likely discharge home once medically stable Consults called: Nephrology, IR, urology, Admission status: Inpatient  Norval Morton MD [facility] Pager [phone]   If 7PM-7AM, please contact night-coverage [email or web address] Password [facility]  08/02/2019, 11:12 AM

## 2019-08-02 NOTE — ED Notes (Signed)
Pt family called for help. Pt had pulled IV in R hand out. Bleeding controlled. Pt very confused, babbling.

## 2019-08-02 NOTE — Consult Note (Signed)
I have been asked to see the patient by Dr. Royce Macadamia, for evaluation and management of perinephric hematoma.  History of present illness: 74 yo woman with hx of CKDIII, dementia and right renal mass s/p biopsy 07/27/19.  Patient now admitted to [facility] after AKI noted with large perinephric right hematoma.  IR has been consulted and will observe if hemodynamically stable.  Patient has dementia and is not able to answer any questions.    Review of systems: A 12 point comprehensive review of systems was obtained and is negative unless otherwise stated in the history of present illness.  Patient Active Problem List   Diagnosis Date Noted  . ARF (acute renal failure) ([place]) 08/02/2019  . Renal cell carcinoma ([place]) 08/02/2019  . Postoperative surgical complication involving genitourinary system associated with genitourinary procedure 08/02/2019  . Acute blood loss anemia 08/02/2019  . Renal hematoma 08/02/2019  . RLS (restless legs syndrome) 01/25/2019  . Right renal mass 01/25/2019  . Splenic mass 01/25/2019  . Blood in stool 01/25/2019  . Dementia without behavioral disturbance ([place]) 01/25/2019  . Hyperthyroidism 09/14/2018  . Osteopenia 05/20/2018  . Multinodular goiter 10/14/2017  . Weight loss, unintentional 07/14/2017  . CAD -S/P PCI 06/26/17 06/27/2017  . 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 Hx of CABG- 2006   . History of diabetes mellitus   . Essential hypertension   . Stage 3 chronic kidney disease   . Hypokalemia 06/02/2011  . Dyslipidemia, goal LDL below 70 06/02/2011    No current facility-administered medications on file prior to encounter.   Current Outpatient Medications on File Prior to Encounter  Medication Sig Dispense Refill  . aspirin EC 81 MG tablet Take 1 tablet (81 mg total) by mouth daily. 90 tablet 3  . atorvastatin (LIPITOR) 80 MG tablet TAKE 1 TABLET BY MOUTH ONCE DAILY. (Patient taking differently: Take 80 mg by mouth daily. ) 90 tablet 0  . calcium  carbonate (OSCAL) 1500 (600 Ca) MG TABS tablet Take 1,500 mg by mouth daily with breakfast.     . cholecalciferol (VITAMIN D) 25 MCG (1000 UT) tablet Take 1,000 Units by mouth daily.     Marland Kitchen donepezil (ARICEPT) 10 MG tablet Take 1 tablet (10 mg total) by mouth at bedtime. 30 tablet 2  . metoprolol succinate (TOPROL XL) 25 MG 24 hr tablet Take 1 tablet (25 mg total) by mouth daily. 90 tablet 3  . nitroGLYCERIN (NITROSTAT) 0.4 MG SL tablet Place 1 tablet (0.4 mg total) under the tongue every 5 (five) minutes x 3 doses as needed for chest pain. 25 tablet 3    Past Medical History:  Diagnosis Date  . Arthritis   . Cancer of kidney ([place])   . Chronic kidney disease, stage 3, mod decreased GFR   . Coronary atherosclerosis of native coronary artery 2006   Multivessel status post CABG in [place], graft disease documented March 2019 with DES to SVG to diagonal  . Essential hypertension   . 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 History of diabetes mellitus, type II   . Hyperlipidemia   . NSTEMI (non-ST elevated myocardial infarction) ([place]) 06/24/2017  . Right renal mass     Past Surgical History:  Procedure Laterality Date  . ABDOMINAL HYSTERECTOMY    . CORONARY ARTERY BYPASS GRAFT  2006   [place], [place]  . CORONARY STENT INTERVENTION N/A 06/25/2017   Procedure: CORONARY STENT INTERVENTION;  Surgeon: Jettie Booze, MD;  Location: [facility];  Service: Cardiovascular;  Laterality: N/A;  . IR RADIOLOGIST EVAL & MGMT  07/08/2019  . LEFT HEART CATH AND CORS/GRAFTS ANGIOGRAPHY N/A 06/25/2017   Procedure: LEFT HEART CATH AND CORS/GRAFTS ANGIOGRAPHY;  Surgeon: Jettie Booze, MD;  Location: [facility];  Service: Cardiovascular;  Laterality: N/A;    Social History   Tobacco Use  . Smoking status: Never Smoker  . Smokeless tobacco: Never Used  Substance Use Topics  . Alcohol use: No  . Drug use: No    Family History  Problem Relation Age of Onset  .  Heart attack Mother   . Hypertension Mother   . Seizures Son   . Seizures Son   . Seizures Daughter     PE: Vitals:   08/02/19 1100 08/02/19 1200 08/02/19 1513 08/02/19 1653  BP: (!) 154/98 (!) 145/72 (!) 147/72   Pulse: 82 100 89 89  Resp: 14 (!) 23 17 17   Temp:    98.8 F (37.1 C)  TempSrc:    Oral  SpO2: 100% 100% 100% 100%   Patient laying in be comfortably Moaning when trying to converse Atraumatic normocephalic head No increased work of breathing Abdomen is soft, nontender, nondistended Patient wearing diaper Lower extremities are symmetric without appreciable edema Unable to perform neurologic exam   Recent Labs    08/02/19 0252 08/02/19 1437  WBC 10.4  --   HGB 9.6* 10.4*  HCT 28.7* 30.4*   Recent Labs    08/02/19 0252 08/02/19 1437  NA 136 139  K 3.0* 4.5  CL 101 103  CO2 20* 20*  GLUCOSE 186* 145*  BUN 96* 92*  CREATININE 6.75* 6.39*  CALCIUM 8.6* 9.1   No results for input(s): LABPT, INR in the last 72 hours. No results for input(s): LABURIN in the last 72 hours. Results for orders placed or performed during the hospital encounter of 08/02/19  Respiratory Panel by RT PCR (Flu A&B, Covid) - Nasopharyngeal Swab     Status: None   Collection Time: 08/02/19  4:49 AM   Specimen: Nasopharyngeal Swab  Result Value Ref Range Status   SARS Coronavirus 2 by RT PCR NEGATIVE NEGATIVE Final    Comment: (NOTE) SARS-CoV-2 target nucleic acids are NOT DETECTED. The SARS-CoV-2 RNA is generally detectable in upper respiratoy specimens during the acute phase of infection. The lowest concentration of SARS-CoV-2 viral copies this assay can detect is 131 copies/mL. A negative result does not preclude SARS-Cov-2 infection and should not be used as the sole basis for treatment or other patient management decisions. A negative result may occur with  improper specimen collection/handling, submission of specimen other than nasopharyngeal swab, presence of viral  mutation(s) within the areas targeted by this assay, and inadequate number of viral copies (<131 copies/mL). A negative result must be combined with clinical observations, patient history, and epidemiological information. The expected result is Negative. Fact Sheet for Patients:  PinkCheek.be Fact Sheet for Healthcare Providers:  GravelBags.it This test is not yet ap proved or cleared by the [place] FDA and  has been authorized for detection and/or diagnosis of SARS-CoV-2 by FDA under an Emergency Use Authorization (EUA). This EUA will remain  in effect (meaning this test can be used) for the duration of the COVID-19 declaration under Section 564(b)(1) of the Act, 21 U.S.C. section 360bbb-3(b)(1), unless the authorization is terminated or revoked sooner.    Influenza A by PCR NEGATIVE NEGATIVE Final   Influenza B by PCR NEGATIVE NEGATIVE Final  Comment: (NOTE) The Xpert Xpress SARS-CoV-2/FLU/RSV assay is intended as an aid in  the diagnosis of influenza from Nasopharyngeal swab specimens and  should not be used as a sole basis for treatment. Nasal washings and  aspirates are unacceptable for Xpert Xpress SARS-CoV-2/FLU/RSV  testing. Fact Sheet for Patients: PinkCheek.be Fact Sheet for Healthcare Providers: GravelBags.it This test is not yet approved or cleared by the [place] FDA and  has been authorized for detection and/or diagnosis of SARS-CoV-2 by  FDA under an Emergency Use Authorization (EUA). This EUA will remain  in effect (meaning this test can be used) for the duration of the  Covid-19 declaration under Section 564(b)(1) of the Act, 21  U.S.C. section 360bbb-3(b)(1), unless the authorization is  terminated or revoked. Performed at [facility], [address] [place], [place] [address]     Imaging: CT Abd/Pelvis 08/02/19 IMPRESSION: ~14 x  4.5 x 6 cm right subcapsular and perinephric hematoma. An underlying mass is partially obscured and included in this volume.  Imp/Recommendations: 74 yo woman with CKDIII, dementia and right renal mass now with large right perinephric hematoma following renal mass biopsy.  Patient is hemodynamicaly stable with AKI.  Nephrology consulted urology for input.   -no urgent urologic intervention -recommend IR embolization of Hgb drop or becomes hemodynamically unstable -plan for nephrectomy at later date with Dr. Alyson Ingles (he is aware patient is admitted) -follow creatinine which appears to be trending down  Thank you for involving me in this patient's care, please page urology with questions or concerns.    Francess Mullen D Cyan Clippinger

## 2019-08-02 NOTE — Progress Notes (Signed)
Chief Complaint: Patient was seen in consultation today for right renal hematoma   Supervising Physician: Arne Cleveland  Patient Status: [facility] - In-pt  History of Present Illness: Erin Jordan is a 74 y.o. female who underwent (R)renal mass biopsy on 4/20 and she did well without immediate complication. However last pm, her family noticed her c/o some increased pains and brought [facility] to [facility] where her labs shows a significant drop in her Hgb, a rise in her Cr and a CT that showed right renal hematoma/hemorrhage. She has been transferred to [facility] for further care. The biopsy did prove to be renal cell carcinoma. She is resting comfortably in the ER, doesn't really voice c/o pain, N/V HR and BP stable. Son at bedside. Being admitted by [facility], spoke to Dr. Tamala Julian. Imaging reviewed with and by Dr. Vernard Gambles   Past Medical History:  Diagnosis Date  . Arthritis   . Cancer of kidney ([place])   . Chronic kidney disease, stage 3, mod decreased GFR   . Coronary atherosclerosis of native coronary artery 2006   Multivessel status post CABG in [place], graft disease documented March 2019 with DES to SVG to diagonal  . Essential hypertension   . 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 History of diabetes mellitus, type II   . Hyperlipidemia   . NSTEMI (non-ST elevated myocardial infarction) ([place]) 06/24/2017  . Right renal mass     Past Surgical History:  Procedure Laterality Date  . ABDOMINAL HYSTERECTOMY    . CORONARY ARTERY BYPASS GRAFT  2006   [place], [place]  . CORONARY STENT INTERVENTION N/A 06/25/2017   Procedure: CORONARY STENT INTERVENTION;  Surgeon: Jettie Booze, MD;  Location: [facility];  Service: Cardiovascular;  Laterality: N/A;  . IR RADIOLOGIST EVAL & MGMT  07/08/2019  . LEFT HEART CATH AND CORS/GRAFTS ANGIOGRAPHY N/A 06/25/2017   Procedure: LEFT HEART CATH AND CORS/GRAFTS ANGIOGRAPHY;  Surgeon: Jettie Booze, MD;  Location: [facility];  Service: Cardiovascular;  Laterality: N/A;    Allergies: Patient has no known allergies.  Medications:  Current Facility-Administered Medications:  .  0.9 %  sodium chloride infusion, , Intravenous, Continuous, Smith, Rondell A, MD .  acetaminophen (TYLENOL) tablet 650 mg, 650 mg, Oral, Q6H PRN **OR** acetaminophen (TYLENOL) suppository 650 mg, 650 mg, Rectal, Q6H PRN, Smith, Rondell A, MD .  albuterol (PROVENTIL) (2.5 MG/3ML) 0.083% nebulizer solution 2.5 mg, 2.5 mg, Nebulization, Q6H PRN, Smith, Rondell A, MD .  morphine 2 MG/ML injection 1-2 mg, 1-2 mg, Intravenous, Q2H PRN, Smith, Rondell A, MD .  ondansetron (ZOFRAN) tablet 4 mg, 4 mg, Oral, Q6H PRN **OR** ondansetron (ZOFRAN) injection 4 mg, 4 mg, Intravenous, Q6H PRN, Smith, Rondell A, MD .  potassium chloride 10 mEq in 100 mL IVPB, 10 mEq, Intravenous, Q1 Hr x 4, Smith, Rondell A, MD .  sodium chloride flush (NS) 0.9 % injection 3 mL, 3 mL, Intravenous, Q12H, Smith, Rondell A, MD  Current Outpatient Medications:  .  amLODipine (NORVASC) 5 MG tablet, TAKE ONE TABLET BY MOUTH ONCE DAILY. (Patient taking differently: Take 5 mg by mouth daily. ), Disp: 30 tablet, Rfl: 2 .  aspirin EC 81 MG tablet, Take 1 tablet (81 mg total) by mouth daily., Disp: 90 tablet, Rfl: 3 .  atorvastatin (LIPITOR) 80 MG tablet, TAKE 1 TABLET BY MOUTH ONCE DAILY. (Patient taking differently: Take 80 mg by mouth daily. ), Disp: 90 tablet, Rfl: 0 .  calcium carbonate (  OSCAL) 1500 (600 Ca) MG TABS tablet, Take 1,500 mg by mouth daily with breakfast. , Disp: , Rfl:  .  cholecalciferol (VITAMIN D) 25 MCG (1000 UT) tablet, Take 1,000 Units by mouth daily. , Disp: , Rfl:  .  donepezil (ARICEPT) 10 MG tablet, Take 1 tablet (10 mg total) by mouth at bedtime., Disp: 30 tablet, Rfl: 2 .  methimazole (TAPAZOLE) 5 MG tablet, TAKE ONE TABLET BY MOUTH DAILY. (Patient taking differently: Take 5 mg by mouth daily. ), Disp: 30 tablet, Rfl: 2 .  metoprolol succinate  (TOPROL XL) 25 MG 24 hr tablet, Take 1 tablet (25 mg total) by mouth daily., Disp: 90 tablet, Rfl: 3 .  nitroGLYCERIN (NITROSTAT) 0.4 MG SL tablet, Place 1 tablet (0.4 mg total) under the tongue every 5 (five) minutes x 3 doses as needed for chest pain., Disp: 25 tablet, Rfl: 3    Family History  Problem Relation Age of Onset  . Heart attack Mother   . Hypertension Mother   . Seizures Son   . Seizures Son   . Seizures Daughter     Social History   Socioeconomic History  . Marital status: Widowed    Spouse name: Not on file  . Number of children: 6  . Years of education: Not on file  . Highest education level: Not on file  Occupational History  . Occupation: retired  Tobacco Use  . Smoking status: Never Smoker  . Smokeless tobacco: Never Used  Substance and Sexual Activity  . Alcohol use: No  . Drug use: No  . Sexual activity: Never  Other Topics Concern  . Not on file  Social History Narrative   Lives in [place] with her daughter and grandchildren.   Social Determinants of Health   Financial Resource Strain: Medium Risk  . Difficulty of Paying Living Expenses: Somewhat hard  Food Insecurity: No Food Insecurity  . Worried About Charity fundraiser in the Last Year: Never true  . Ran Out of Food in the Last Year: Never true  Transportation Needs: No Transportation Needs  . Lack of Transportation (Medical): No  . Lack of Transportation (Non-Medical): No  Physical Activity: Inactive  . Days of Exercise per Week: 0 days  . Minutes of Exercise per Session: 0 min  Stress: No Stress Concern Present  . Feeling of Stress : Only a little  Social Connections: Somewhat Isolated  . Frequency of Communication with Friends and Family: Three times a week  . Frequency of Social Gatherings with Friends and Family: Three times a week  . Attends Religious Services: 1 to 4 times per year  . Active Member of Clubs or Organizations: No  . Attends Archivist Meetings: Never   . Marital Status: Widowed     Review of Systems: A 12 point ROS discussed and pertinent positives are indicated in the HPI above.  All other systems are negative.  Review of Systems  Vital Signs: BP (!) 158/78 (BP Location: Right Arm)   Pulse 92   Temp (!) 96.9 F (36.1 C) (Oral)   Resp 15   SpO2 100%   Physical Exam Constitutional:      General: She is not in acute distress.    Appearance: She is well-developed. She is not ill-appearing.  HENT:     Mouth/Throat:     Mouth: Mucous membranes are moist.     Pharynx: Oropharynx is clear.  Cardiovascular:     Rate and Rhythm: Normal rate and  regular rhythm.     Pulses: Normal pulses.     Heart sounds: Normal heart sounds.  Pulmonary:     Effort: Pulmonary effort is normal. No respiratory distress.     Breath sounds: Normal breath sounds.  Abdominal:     General: Abdomen is flat.     Palpations: Abdomen is soft.     Tenderness: There is no abdominal tenderness.  Skin:    General: Skin is warm and dry.  Neurological:     General: No focal deficit present.     Mental Status: She is alert and oriented to person, place, and time.  Psychiatric:        Mood and Affect: Mood normal.        Thought Content: Thought content normal.        Judgment: Judgment normal.       Imaging: CT ABDOMEN PELVIS WO CONTRAST  Result Date: 08/02/2019 CLINICAL DATA:  Recent kidney biopsy with sharp abdominal pain. EXAM: CT ABDOMEN AND PELVIS WITHOUT CONTRAST TECHNIQUE: Multidetector CT imaging of the abdomen and pelvis was performed following the standard protocol without IV contrast. COMPARISON:  Six days ago FINDINGS: Lower chest:  Coronary atherosclerosis.  No acute finding Hepatobiliary: No focal liver abnormality.No evidence of biliary obstruction or stone. Pancreas: Unremarkable. Spleen: Occult mass when compared with MRI. Interval subcapsular hemorrhage deforming the cortex and decompressing into the inferior perirenal space, with  overall hematoma dimensions of 14 x 4.5 x 5.8 cm. A superimposed mass is largely obscured and is included in this measurement. Negative left kidney. Unremarkable bladder. Stomach/Bowel:  No obstruction. No appendicitis. Vascular/Lymphatic: No acute vascular abnormality. No mass or adenopathy. Reproductive:Hysterectomy. Other: No ascites or pneumoperitoneum. Musculoskeletal: No acute abnormalities. Lower lumbar facet arthropathy with L4-5 anterolisthesis. These results were called by telephone at the time of interpretation on 08/02/2019 at 4:37 am to provider [facility] , who verbally acknowledged these results. IMPRESSION: ~14 x 4.5 x 6 cm right subcapsular and perinephric hematoma. An underlying mass is partially obscured and included in this volume. Electronically Signed   By: Monte Fantasia M.D.   On: 08/02/2019 04:39   CT BIOPSY  Result Date: 07/27/2019 CLINICAL DATA:  Right renal mass EXAM: CT GUIDED CORE BIOPSY OF RIGHT RENAL MASS ANESTHESIA/SEDATION: Intravenous Fentanyl 57mcg and Versed 1mg  were administered as conscious sedation during continuous monitoring of the patient's level of consciousness and physiological / cardiorespiratory status by the radiology RN, with a total moderate sedation time of 20 minutes. PROCEDURE: The procedure risks, benefits, and alternatives were explained to the patient. Questions regarding the procedure were encouraged and answered. The patient understands and consents to the procedure. Initially, ultrasound of the right kidney was performed, demonstrating a hypoechoic solid-appearing mass measuring at least 3.9 cm partially cyst exophytic from the midpole region. Based on the CT images, an appropriate skin entry site was determined and marked. The operative field was prepped with chlorhexidinein a sterile fashion, and a sterile drape was applied covering the operative field. A sterile gown and sterile gloves were used for the procedure. Local anesthesia was  provided with 1% Lidocaine. Under CT fluoroscopic guidance, a 17 gauge trocar needle was advanced to the margin of the lesion. Once needle tip position was confirmed, coaxial 18-gauge core biopsy samples were obtained, submitted in formalin to surgical pathology. The guide needle was removed. Postprocedure scans show no hemorrhage or other apparent complication. The patient tolerated the procedure well. COMPLICATIONS: None immediate FINDINGS: Exophytic echogenic mass from the mid  pole region of the right kidney measuring at least 4 cm diameter on ultrasound. Mild right hydronephrosis also identified. Technically successful core biopsy of right renal mass. IMPRESSION: 1. Technically successful CT-guided core biopsy, right renal mass. Electronically Signed   By: Lucrezia Europe M.D.   On: 07/27/2019 17:10   IR Radiologist Eval & Mgmt  Result Date: 07/08/2019 Please refer to notes tab for details about interventional procedure. (Op Note)   Labs:  CBC: Recent Labs    01/29/19 0941 07/27/19 1005 08/02/19 0252  WBC 5.7 6.3 10.4  HGB 13.3 13.9 9.6*  HCT 40.6 41.1 28.7*  PLT 198 199 250    COAGS: Recent Labs    07/27/19 1005  INR 1.0    BMP: Recent Labs    01/29/19 0941 07/27/19 1005 08/02/19 0252  NA 142 141 136  K 4.1 3.4* 3.0*  CL 107 106 101  CO2 27 26 20*  GLUCOSE 125* 109* 186*  BUN 38* 26* 96*  CALCIUM 9.6 9.6 8.6*  CREATININE 1.61* 1.60* 6.75*  GFRNONAA 31* 31* 5*  GFRAA 36* 36* 6*    LIVER FUNCTION TESTS: Recent Labs    01/29/19 0941 08/02/19 0252  BILITOT 0.6 1.0  AST 17 26  ALT 16 26  ALKPHOS 85 94  PROT 7.1 6.8  ALBUMIN 4.2 3.4*    TUMOR MARKERS: No results for input(s): AFPTM, CEA, CA199, CHROMGRNA in the last 8760 hours.  Assessment and Plan: Right renal hematoma/hemorrhage following right renal mass biopsy 1 week ago. Pathology positive right renal cell carcinoma. Pt currently hemodynamically stable. Hgb drop to 9.6, follow trends. Cr bump to 6.7,  will repeat to validate. Imaging and case reviewed with Dr. Vernard Gambles Initially felt angiogram would be prudent to evaluate for bleeding source(ie pseudoaneurysm), however given stability, would prefer to hold off use of contrast which could potentially worsen AKI. Additionally, embolization would leave necrotic tumor, which carries additional risk of complications. Favor conservative mgmt at this time. Will ask Urology to see pt to consider formal nephrectomy given RCC dx and that this is no longer a likely candidate for cryoablation as originally discussed with Dr. Anselm Pancoast. IR will follow closely and certainly if she becomes unstable, then angio can still easily be offered/performed. Discussed at length with pt and her son. Also updated [facility] Dr. Tamala Julian of plan.  Thank you for this interesting consult.  I greatly enjoyed meeting Erin Jordan and look forward to participating in their care.  A copy of this report was sent to the requesting provider on this date.  Electronically Signed: Ascencion Dike, PA-C 08/02/2019, 1:32 PM   I spent a total of 40 minutes in face to face in clinical consultation, greater than 50% of which was counseling/coordinating care for renal hemorrhage after biopsy

## 2019-08-02 NOTE — ED Notes (Signed)
Pt arrives to [facility] ED by [facility]

## 2019-08-03 LAB — COMPREHENSIVE METABOLIC PANEL
ALT: 23 U/L (ref 0–44)
AST: 24 U/L (ref 15–41)
Albumin: 2.9 g/dL — ABNORMAL LOW (ref 3.5–5.0)
Alkaline Phosphatase: 85 U/L (ref 38–126)
Anion gap: 17 — ABNORMAL HIGH (ref 5–15)
BUN: 91 mg/dL — ABNORMAL HIGH (ref 8–23)
CO2: 16 mmol/L — ABNORMAL LOW (ref 22–32)
Calcium: 8.9 mg/dL (ref 8.9–10.3)
Chloride: 108 mmol/L (ref 98–111)
Creatinine, Ser: 5.95 mg/dL — ABNORMAL HIGH (ref 0.44–1.00)
GFR calc Af Amer: 7 mL/min — ABNORMAL LOW (ref >60)
GFR calc non Af Amer: 6 mL/min — ABNORMAL LOW (ref >60)
Glucose, Bld: 105 mg/dL — ABNORMAL HIGH (ref 70–99)
Potassium: 3.9 mmol/L (ref 3.5–5.1)
Sodium: 141 mmol/L (ref 135–145)
Total Bilirubin: 1.2 mg/dL (ref 0.3–1.2)
Total Protein: 6.2 g/dL — ABNORMAL LOW (ref 6.5–8.1)

## 2019-08-03 LAB — HEMOGLOBIN A1C
Hgb A1c MFr Bld: 6.2 % — ABNORMAL HIGH (ref 4.8–5.6)
Mean Plasma Glucose: 131.24 mg/dL

## 2019-08-03 LAB — GLUCOSE, CAPILLARY
Glucose-Capillary: 135 mg/dL — ABNORMAL HIGH (ref 70–99)
Glucose-Capillary: 127 mg/dL — ABNORMAL HIGH (ref 70–99)

## 2019-08-03 LAB — CBC
HCT: 27.3 % — ABNORMAL LOW (ref 36.0–46.0)
Hemoglobin: 9.6 g/dL — ABNORMAL LOW (ref 12.0–15.0)
MCH: 30.9 pg (ref 26.0–34.0)
MCHC: 35.2 g/dL (ref 30.0–36.0)
MCV: 87.8 fL (ref 80.0–100.0)
Platelets: 280 10*3/uL (ref 150–400)
RBC: 3.11 MIL/uL — ABNORMAL LOW (ref 3.87–5.11)
RDW: 12.4 % (ref 11.5–15.5)
WBC: 10.8 10*3/uL — ABNORMAL HIGH (ref 4.0–10.5)
nRBC: 0 % (ref 0.0–0.2)

## 2019-08-03 MED ORDER — INSULIN ASPART 100 UNIT/ML ~~LOC~~ SOLN: 0.0000 [IU] | Freq: Every day | SUBCUTANEOUS | Status: DC

## 2019-08-03 MED ORDER — INSULIN ASPART 100 UNIT/ML ~~LOC~~ SOLN: 0.0000 [IU] | Freq: Three times a day (TID) | SUBCUTANEOUS | Status: DC

## 2019-08-03 NOTE — Progress Notes (Signed)
PROGRESS NOTE    ANGELIKA JERRETT  WUJ:[id number] DOB: 04-Dec-1945 DOA: 08/02/2019 PCP: [name], FNP   Brief Narrative:   HPI: Erin Jordan is a 74 y.o. female with medical history significant of hypertension, hyperlipidemia, CKD stage III, dementia, and right renal mass presented with complaints of right-sided abdominal pain.  History is obtained from the patient with the assistance of her son who is present at bedside as he patient has some issues with memory.  Apparently, patient felt something pop on her right side last night.  She describes having a sharp pain felt like a pin sticking her.  Possibly notes having decreased urine output.  She had just recently had a CT-guided biopsy of a mass of her right side of her kidney 6 days ago.  Following the procedure patient and initially not had any issues.  Once home she had been doing fine until yesterday evening.  Denies having any recent falls or trauma.  Review of records from pathology report revealed renal cell carcinoma.  ED Course: Upon admission into the emergency department patient was seen to be afebrile, blood pressure 114/62-160/69, and all other vital signs within normal limits.  Labs significant for hemoglobin 9.6 (hemoglobin 13.9 on 4/20), creatinine 6.75(creatinine 1.6 4/20), BUN 96, and lipase 290.  COVID-19 and influenza screening negative.  Patient was given 500 mL of normal saline IV fluids.  IR and nephrology have been formally consulted.  Patient was transferred from [facility] to [facility] for need of IR services.  TR[facility] called to admit.  Assessment & Plan:   Principal Problem:   Postoperative surgical complication involving genitourinary system associated with genitourinary procedure Active Problems:   Hypokalemia   Dyslipidemia, goal LDL below 70   History of diabetes mellitus   Essential hypertension   CAD -S/P PCI 06/26/17   Hyperthyroidism   Dementia without behavioral disturbance (HCC)   ARF (acute  renal failure) (HCC)   Renal cell carcinoma (HCC)   Acute blood loss anemia   Renal hematoma   Right kidney hematoma/ Post procedure complication of renal biopsy performed on 07/27/2019/renal cell carcinoma Acute blood loss: Acute.  Patient presents with complaints of right side pain.  Found to have hemoglobin dropping from 13.9 6 days ago down to 9.6 on admission.  Vital signs stable.  Imaging studies revealed 14 x 4.5 x 6 cm right subcapsular perinephric hematoma on previous biopsy kidney.  IR was consulted with initial plan to take patient for arteriogram, but consulted urology for need of right nephrectomy given renal cell carcinoma diagnosis to decrease contrast exposure however urology opined for no urgent urological intervention and recommended IR embolization if patient becomes unstable or hemoglobin drops and continue to stick to plan of nephrectomy at a later that with Dr. Alyson Ingles.  Patient's hemoglobin have been fairly stable.  We defer timing and decision of embolization versus nephrectomy to IR and urology.  Monitor CBC daily.  Acute renal failure superimposed on chronic kidney disease stage IIIb: Patient baseline creatinine previously noted to be around 1.6, but presents with creatinine elevated up to 6.75 with BUN 96.  UA significant for small amount of hemoglobin with rare bacteria.  Suspect hematoma contributing to kidney dysfunction patient is followed in the outpatient setting by Dr. Lowanda Foster.  Nephrology consulted and they are following patient.  They opined that patient is not a candidate for HD due to dementia.  Hypokalemia: Resolved.  Diabetes mellitus type 2 : Hemoglobin A1c 6.2.  She is  not on any medications.  Blood sugar controlled.  Will start on SSI.   Hyperthyroidism: Continue methimazole.  Essential hypertension: Blood pressure controlled.  Home blood pressure medications include amlodipine 5 mg daily, metoprolol succinate 25 mg daily.  History of CAD: Status  post CABG in 2006 and PCI in 2019. -Continue to hold aspirin due to hematoma  Dyslipidemia: Holding atorvastatin for now.  Dementia: Continue Aricept  DVT prophylaxis: SCD   Code Status: Full Code  Family Communication:  None present at bedside.   Patient is from: Home Disposition Plan: To be determined Barriers to discharge: Needs close monitoring in the hospital setting for perinephric hematoma  Status is: Inpatient  Remains inpatient appropriate because:Inpatient level of care appropriate due to severity of illness   Dispo: The patient is from: Home              Anticipated d/c is to: SNF              Anticipated d/c date is: 2 days              Patient currently is not medically stable to d/c.         Estimated body mass index is 21.21 kg/m as calculated from the following:   Height as of 07/27/19: 5\' 2"  (1.575 m).   Weight as of 07/27/19: 52.6 kg.      Nutritional status:               Consultants:   IR/urology/nephrology  Procedures:   None during this hospitalization  Antimicrobials:  Anti-infectives (From admission, onward)   None         Subjective: Patient seen and examined.  She is alert but disoriented.  Likely at her baseline due to her dementia.  Complained of right flank pain.  No other complaint.  Objective: Vitals:   08/02/19 2101 08/03/19 0107 08/03/19 0514 08/03/19 0847  BP: 135/63 (!) 145/56 (!) 141/66 (!) 151/59  Pulse: 99 82 93 85  Resp: 20 18 18 12   Temp: 98.7 F (37.1 C) 97.7 F (36.5 C) 99.2 F (37.3 C) 97.7 F (36.5 C)  TempSrc: Oral Oral Oral Oral  SpO2: 100% 100% 100% 98%    Intake/Output Summary (Last 24 hours) at 08/03/2019 1348 Last data filed at 08/03/2019 0601 Gross per 24 hour  Intake 3216.54 ml  Output 485 ml  Net 2731.54 ml   There were no vitals filed for this visit.  Examination:  General exam: Appears calm and comfortable  Respiratory system: Clear to auscultation. Respiratory effort  normal. Cardiovascular system: S1 & S2 heard, RRR. No JVD, murmurs, rubs, gallops or clicks. No pedal edema. Gastrointestinal system: Abdomen is nondistended, soft.  Right CVA tenderness. No organomegaly or masses felt. Normal bowel sounds heard. Central nervous system: Alert and oriented. No focal neurological deficits. Extremities: Symmetric 5 x 5 power. Skin: No rashes, lesions or ulcers Psychiatry: Judgement and insight appear poor. Mood & affect flat   Data Reviewed: I have personally reviewed following labs and imaging studies  CBC: Recent Labs  Lab 08/02/19 0252 08/02/19 1437 08/03/19 0652  WBC 10.4  --  10.8*  NEUTROABS 7.9*  --   --   HGB 9.6* 10.4* 9.6*  HCT 28.7* 30.4* 27.3*  MCV 92.9  --  87.8  PLT 250  --  540   Basic Metabolic Panel: Recent Labs  Lab 08/02/19 0252 08/02/19 1437 08/03/19 0652  NA 136 139 141  K 3.0* 4.5 3.9  CL 101 103 108  CO2 20* 20* 16*  GLUCOSE 186* 145* 105*  BUN 96* 92* 91*  CREATININE 6.75* 6.39* 5.95*  CALCIUM 8.6* 9.1 8.9  PHOS  --  5.1*  --    GFR: Estimated Creatinine Clearance: 6.5 mL/min (A) (by C-G formula based on SCr of 5.95 mg/dL (H)). Liver Function Tests: Recent Labs  Lab 08/02/19 0252 08/02/19 1437 08/03/19 0652  AST 26  --  24  ALT 26  --  23  ALKPHOS 94  --  85  BILITOT 1.0  --  1.2  PROT 6.8  --  6.2*  ALBUMIN 3.4* 3.2* 2.9*   Recent Labs  Lab 08/02/19 0252  LIPASE 290*   No results for input(s): AMMONIA in the last 168 hours. Coagulation Profile: No results for input(s): INR, PROTIME in the last 168 hours. Cardiac Enzymes: Recent Labs  Lab 08/02/19 1437  CKTOTAL 90   BNP (last 3 results) No results for input(s): PROBNP in the last 8760 hours. HbA1C: Recent Labs    08/03/19 0652  HGBA1C 6.2*   CBG: No results for input(s): GLUCAP in the last 168 hours. Lipid Profile: No results for input(s): CHOL, HDL, LDLCALC, TRIG, CHOLHDL, LDLDIRECT in the last 72 hours. Thyroid Function  Tests: Recent Labs    08/02/19 1437  FREET4 1.16*   Anemia Panel: No results for input(s): VITAMINB12, FOLATE, FERRITIN, TIBC, IRON, RETICCTPCT in the last 72 hours. Sepsis Labs: Recent Labs  Lab 08/02/19 0252  LATICACIDVEN 1.0    Recent Results (from the past 240 hour(s))  Respiratory Panel by RT PCR (Flu A&B, Covid) - Nasopharyngeal Swab     Status: None   Collection Time: 08/02/19  4:49 AM   Specimen: Nasopharyngeal Swab  Result Value Ref Range Status   SARS Coronavirus 2 by RT PCR NEGATIVE NEGATIVE Final    Comment: (NOTE) SARS-CoV-2 target nucleic acids are NOT DETECTED. The SARS-CoV-2 RNA is generally detectable in upper respiratoy specimens during the acute phase of infection. The lowest concentration of SARS-CoV-2 viral copies this assay can detect is 131 copies/mL. A negative result does not preclude SARS-Cov-2 infection and should not be used as the sole basis for treatment or other patient management decisions. A negative result may occur with  improper specimen collection/handling, submission of specimen other than nasopharyngeal swab, presence of viral mutation(s) within the areas targeted by this assay, and inadequate number of viral copies (<131 copies/mL). A negative result must be combined with clinical observations, patient history, and epidemiological information. The expected result is Negative. Fact Sheet for Patients:  PinkCheek.be Fact Sheet for Healthcare Providers:  GravelBags.it This test is not yet ap proved or cleared by the [place] FDA and  has been authorized for detection and/or diagnosis of SARS-CoV-2 by FDA under an Emergency Use Authorization (EUA). This EUA will remain  in effect (meaning this test can be used) for the duration of the COVID-19 declaration under Section 564(b)(1) of the Act, 21 U.S.C. section 360bbb-3(b)(1), unless the authorization is terminated or revoked  sooner.    Influenza A by PCR NEGATIVE NEGATIVE Final   Influenza B by PCR NEGATIVE NEGATIVE Final    Comment: (NOTE) The Xpert Xpress SARS-CoV-2/FLU/RSV assay is intended as an aid in  the diagnosis of influenza from Nasopharyngeal swab specimens and  should not be used as a sole basis for treatment. Nasal washings and  aspirates are unacceptable for Xpert Xpress SARS-CoV-2/FLU/RSV  testing. Fact Sheet for Patients: PinkCheek.be Fact Sheet for Healthcare  Providers: GravelBags.it This test is not yet approved or cleared by the [place] and  has been authorized for detection and/or diagnosis of SARS-CoV-2 by  FDA under an Emergency Use Authorization (EUA). This EUA will remain  in effect (meaning this test can be used) for the duration of the  Covid-19 declaration under Section 564(b)(1) of the Act, 21  U.S.C. section 360bbb-3(b)(1), unless the authorization is  terminated or revoked. Performed at [facility], [address] [place], [place] [address]       Radiology Studies: CT ABDOMEN PELVIS WO CONTRAST  Result Date: 08/02/2019 CLINICAL DATA:  Recent kidney biopsy with sharp abdominal pain. EXAM: CT ABDOMEN AND PELVIS WITHOUT CONTRAST TECHNIQUE: Multidetector CT imaging of the abdomen and pelvis was performed following the standard protocol without IV contrast. COMPARISON:  Six days ago FINDINGS: Lower chest:  Coronary atherosclerosis.  No acute finding Hepatobiliary: No focal liver abnormality.No evidence of biliary obstruction or stone. Pancreas: Unremarkable. Spleen: Occult mass when compared with MRI. Interval subcapsular hemorrhage deforming the cortex and decompressing into the inferior perirenal space, with overall hematoma dimensions of 14 x 4.5 x 5.8 cm. A superimposed mass is largely obscured and is included in this measurement. Negative left kidney. Unremarkable bladder. Stomach/Bowel:  No obstruction. No  appendicitis. Vascular/Lymphatic: No acute vascular abnormality. No mass or adenopathy. Reproductive:Hysterectomy. Other: No ascites or pneumoperitoneum. Musculoskeletal: No acute abnormalities. Lower lumbar facet arthropathy with L4-5 anterolisthesis. These results were called by telephone at the time of interpretation on 08/02/2019 at 4:37 am to provider [facility] , who verbally acknowledged these results. IMPRESSION: ~14 x 4.5 x 6 cm right subcapsular and perinephric hematoma. An underlying mass is partially obscured and included in this volume. Electronically Signed   By: Monte Fantasia M.D.   On: 08/02/2019 04:39    Scheduled Meds: . amLODipine  5 mg Oral Daily  . donepezil  10 mg Oral QHS  . methimazole  5 mg Oral Daily  . metoprolol succinate  25 mg Oral Daily  . sodium chloride flush  3 mL Intravenous Q12H   Continuous Infusions: . sodium chloride 75 mL/hr at 08/03/19 1110     LOS: 1 day   Time spent: 34 minutes   Darliss Cheney, MD [facility]  08/03/2019, 1:48 PM   To contact the attending provider between 7A-7P or the covering provider during after hours 7P-7A, please log into the web site [email or web address].

## 2019-08-03 NOTE — Consult Note (Addendum)
[facility] Progress Note  PCP: [name], FNP Admit Date: 08/02/2019 Requesting Physician: Norval Morton, MD Reason for Consult:  Acute renal failure  Assessment/ Plan:   1. Acute on CKD IIIb  Right Renal Hematoma  abdominal pain Patient admitted for acute kidney injury with creatinine elevated to 6.75 and BUN of 96 on admission.  Cause of AKI is presumed to be hematoma s/p renal biopsy on 07/27/2019 which confirmed renal cell carcinoma.  Urology and IR have both been consulted.  Urology recommends for no acute intervention at this time and plans for possible nephrectomy at later date with Dr. Alyson Ingles.  IR was consulted for potential angiogram or embolization.  Given patient is hemodynamically stable at this time, IR chooses to avoid any emergent intervention if any rebleed or instability. Patient unchanged this morning from yesterday's exam. A&O to self only. No abdominal pain on my exam. Mild RUQ pain on Dr. Luis Abed exam. Electrolytes this morning are stable.  Creatinine is 5.95 with BUN of 91. Patient has had one UOP charted since admission to [facility]. No foley in at this time.  -avoid morphine -Daily RFP -monitor UOP and abdominal pain -okay to eat from renal standpoint -conservative management at this time -continue 75 cc/hr fluid 2. Anemia, acute Acute blood loss of 13.9 to 9.6 on admission. 9.6 today. Hemodynamically stable  - Continue to monitor- per primary team  -Hold home ASA   3. Dementia - continue home aricept 10mg   4. Renal Cell Carcinoma, papillary, type I, nuclear grade 3  -defer to outpatient oncology 5. Hypertension  -Continue home toprol and amlodipine  - monitor mIVF, currently 24ml/hr  6. HLD 7. Multinodular Goiter - methimazole  8. CAD - NSTEMI s/p PCI 06/2017, CABG 2006 -hold ASA 9. PET 02/2019: 4 cm splenic mass w/o uptake, likely benign. No metastatic disease   Subjective:   Lying quietly in bed this morning.  No complaints no pain.    Objective:   BP (!) 141/66 (BP Location: Right Arm)   Pulse 93   Temp 99.2 F (37.3 C) (Oral)   Resp 18   SpO2 100%  Intake/Output      04/26 0701 - 04/27 0700 04/27 0701 - 04/28 0700   P.O. 2201    I.V. 1015.5    Other 0    Total Intake 3216.5    Urine 485    Total Output 485    Net +2731.5         Urine Occurrence 1 x    Stool Occurrence 0 x    Emesis Occurrence 0 x     There were no vitals filed for this visit. Weight change:   Physical Exam: Gen: Lying in bed comfortably, awake and alert CVS: 2 out of 6 systolic murmur, minimal S1, normal S2 Resp: Clear to auscultation bilaterally.  No acute respiratory distress Abd: Soft, nondistended nontender to palpation.  Positive bowel sounds Ext: No lower extremity edema.  All extremities moving spontaneously, warm and well perfused  Imaging: CT ABDOMEN PELVIS WO CONTRAST Result Date: 08/02/2019 ~14 x 4.5 x 6 cm right subcapsular and perinephric hematoma. An underlying mass is partially obscured and included in this volume.   Labs: BMET Recent Labs  Lab 07/27/19 1005 08/02/19 0252 08/02/19 1437 08/03/19 0652  NA 141 136 139 141  K 3.4* 3.0* 4.5 3.9  CL 106 101 103 108  CO2 26 20* 20* 16*  GLUCOSE 109* 186* 145* 105*  BUN 26* 96* 92*  91*  CREATININE 1.60* 6.75* 6.39* 5.95*  CALCIUM 9.6 8.6* 9.1 8.9  PHOS  --   --  5.1*  --    CBC Recent Labs  Lab 07/27/19 1005 08/02/19 0252 08/02/19 1437 08/03/19 0652  WBC 6.3 10.4  --  10.8*  NEUTROABS  --  7.9*  --   --   HGB 13.9 9.6* 10.4* 9.6*  HCT 41.1 28.7* 30.4* 27.3*  MCV 91.9 92.9  --  87.8  PLT 199 250  --  280    Medications:    . amLODipine  5 mg Oral Daily  . donepezil  10 mg Oral QHS  . methimazole  5 mg Oral Daily  . metoprolol succinate  25 mg Oral Daily  . sodium chloride flush  3 mL Intravenous Q12H    Zettie Cooley, MD [place] Resident, PGY2 08/03/2019, 8:47 AM

## 2019-08-04 ENCOUNTER — Encounter (HOSPITAL_COMMUNITY): Payer: Self-pay | Admitting: Internal Medicine

## 2019-08-04 LAB — CBC
HCT: 26 % — ABNORMAL LOW (ref 36.0–46.0)
Hemoglobin: 8.7 g/dL — ABNORMAL LOW (ref 12.0–15.0)
MCH: 30.9 pg (ref 26.0–34.0)
MCHC: 33.5 g/dL (ref 30.0–36.0)
MCV: 92.2 fL (ref 80.0–100.0)
Platelets: 271 10*3/uL (ref 150–400)
RBC: 2.82 MIL/uL — ABNORMAL LOW (ref 3.87–5.11)
RDW: 12.2 % (ref 11.5–15.5)
WBC: 7.9 10*3/uL (ref 4.0–10.5)
nRBC: 0 % (ref 0.0–0.2)

## 2019-08-04 LAB — MAGNESIUM: Magnesium: 2 mg/dL (ref 1.7–2.4)

## 2019-08-04 LAB — BASIC METABOLIC PANEL
Anion gap: 11 (ref 5–15)
BUN: 65 mg/dL — ABNORMAL HIGH (ref 8–23)
CO2: 20 mmol/L — ABNORMAL LOW (ref 22–32)
Calcium: 8.6 mg/dL — ABNORMAL LOW (ref 8.9–10.3)
Chloride: 109 mmol/L (ref 98–111)
Creatinine, Ser: 4.37 mg/dL — ABNORMAL HIGH (ref 0.44–1.00)
GFR calc Af Amer: 11 mL/min — ABNORMAL LOW (ref >60)
GFR calc non Af Amer: 9 mL/min — ABNORMAL LOW (ref >60)
Glucose, Bld: 198 mg/dL — ABNORMAL HIGH (ref 70–99)
Potassium: 3.3 mmol/L — ABNORMAL LOW (ref 3.5–5.1)
Sodium: 140 mmol/L (ref 135–145)

## 2019-08-04 LAB — GLUCOSE, CAPILLARY
Glucose-Capillary: 130 mg/dL — ABNORMAL HIGH (ref 70–99)
Glucose-Capillary: 137 mg/dL — ABNORMAL HIGH (ref 70–99)
Glucose-Capillary: 115 mg/dL — ABNORMAL HIGH (ref 70–99)
Glucose-Capillary: 147 mg/dL — ABNORMAL HIGH (ref 70–99)

## 2019-08-04 MED ORDER — POTASSIUM CHLORIDE CRYS ER 20 MEQ PO TBCR
20.0000 meq | EXTENDED_RELEASE_TABLET | Freq: Once | ORAL | Status: AC
  Administered 2019-08-04: 20 meq via ORAL
  Filled 2019-08-04: qty 1

## 2019-08-04 NOTE — Plan of Care (Signed)
  Problem: Safety: Goal: Ability to remain free from injury will improve Outcome: Progressing   

## 2019-08-04 NOTE — Progress Notes (Signed)
  Images and chart reviewed and case discussed with Dr. Earleen Newport.   Hgb 8.7 this am. Down from 9.6.   Vitals remain stable 141/65, pulse 72.   Since patient remains stable and creatinine still elevated (AKI) recommend hold off on arteriogram/embolization unless evidence of re-bleed.  Emine Lopata S Quade Ramirez PA-C 08/04/2019 1:14 PM

## 2019-08-04 NOTE — Progress Notes (Addendum)
[facility] Progress Note  PCP: [name], FNP Admit Date: 08/02/2019 Requesting Physician: Norval Morton, MD Reason for Consult:  Acute renal failure  Assessment/ Plan:   1. AKI CKD IIIb  Right Renal Hematoma  abdominal pain Patient admitted for acute kidney injury with creatinine elevated to 6.75 and BUN of 96 on admission.  Cause of AKI is presumed to be hematoma s/p renal biopsy on 07/27/2019 which confirmed renal cell carcinoma.  Neurology and IR have both been consulted recommending no emergent interventions at this time.  Continue conservative management. Patient's mental status has improved slightly from intial admission. She is more alert, her speech less slurred. Still A&O to herself only. Patient's creatinine is improving to 4.37 with BUN 65.  She is noted to have some mild hypokalemia to 3.3.  Patient is voiding on her own and noted to have multiple urine outputs yesterday.  -Daily RFP -monitor UOP and abdominal pain -continue conservative management at this time -continue 75 cc/hr fluid, if good fluid intake, can d/c mIVF  2. Anemia, acute Hemoglobin 8.7 from 9.6 yesterday.. Hemodynamically stable  - Continue to monitor- per primary team  -Hold home ASA  in setting of hematoma 3. Dementia - continue home aricept 10mg   4. Renal Cell Carcinoma, papillary, type I, nuclear grade 3  -defer to outpatient oncology 5. Hypertension  -Continue home toprol and amlodipine  - monitor mIVF, currently 69ml/hr  6. HLD 7. Multinodular Goiter - methimazole  8. CAD - NSTEMI s/p PCI 06/2017, CABG 2006 -hold ASA 9. PET 02/2019: 4 cm splenic mass w/o uptake, likely benign. No metastatic disease   Subjective:   Lying in bed this morning, no complaints   Objective:   BP (!) 145/75 (BP Location: Left Arm)   Pulse 85   Temp 99.3 F (37.4 C)   Resp 17   SpO2 100%  Intake/Output      04/27 0701 - 04/28 0700 04/28 0701 - 04/29 0700   P.O. 540    I.V. 1392.9     Other     Total Intake 1932.9    Urine 200    Emesis/NG output 0    Stool 0    Blood 0    Total Output 200    Net +1732.9         Urine Occurrence 4 x 1 x   Stool Occurrence 1 x    Emesis Occurrence 0 x     There were no vitals filed for this visit. Weight change:   Physical Exam: General: Ill-appearing female, no acute distress. Appears more alert than previous exams. She notes she is somewhere that "no one wants to be", but does not say hospital.   Cardiovascular: Regular rate and rhythm.  2/6 systolic murmur, unchanged Lungs: CTA B, no acute respiratory distress Abdomen: Nontender, nondistended.  Positive bowel sounds. MSK: No BLEE.  Extremities warm and well perfused and moving spontaneously  Imaging: CT ABDOMEN PELVIS WO CONTRAST Result Date: 08/02/2019 ~14 x 4.5 x 6 cm right subcapsular and perinephric hematoma. An underlying mass is partially obscured and included in this volume.   Labs: BMET Recent Labs  Lab 08/02/19 0252 08/02/19 1437 08/03/19 0652  NA 136 139 141  K 3.0* 4.5 3.9  CL 101 103 108  CO2 20* 20* 16*  GLUCOSE 186* 145* 105*  BUN 96* 92* 91*  CREATININE 6.75* 6.39* 5.95*  CALCIUM 8.6* 9.1 8.9  PHOS  --  5.1*  --  CBC Recent Labs  Lab 08/02/19 0252 08/02/19 1437 08/03/19 0652 08/04/19 0808  WBC 10.4  --  10.8* 7.9  NEUTROABS 7.9*  --   --   --   HGB 9.6* 10.4* 9.6* 8.7*  HCT 28.7* 30.4* 27.3* 26.0*  MCV 92.9  --  87.8 92.2  PLT 250  --  280 271    Medications:    . amLODipine  5 mg Oral Daily  . donepezil  10 mg Oral QHS  . insulin aspart  0-5 Units Subcutaneous QHS  . insulin aspart  0-6 Units Subcutaneous TID WC  . methimazole  5 mg Oral Daily  . metoprolol succinate  25 mg Oral Daily  . sodium chloride flush  3 mL Intravenous Q12H    Zettie Cooley, MD [place] Resident, PGY2 08/04/2019, 9:15 AM

## 2019-08-04 NOTE — Progress Notes (Signed)
Progress Note    Erin Jordan  SEG:[id number] DOB: 02-20-1946  DOA: 08/02/2019 PCP: [name], FNP    Brief Narrative:     Medical records reviewed and are as summarized below:  Erin Jordan is an 74 y.o. female with past medical history that includes hypertension, hyperlipidemia, chronic kidney disease stage III, dementia, right renal mass admitted April 26 with acute kidney injury presumably caused by hematoma status post renal biopsy on April 20 which confirmed renal cell carcinoma.  Evaluated by neurology and interventional radiology who recommend no emergent interventions and conservative management.  Assessment/Plan:   Principal Problem:   Postoperative surgical complication involving genitourinary system associated with genitourinary procedure Active Problems:   Hypokalemia   ARF (acute renal failure) (HCC)   Acute blood loss anemia   Renal hematoma   Essential hypertension   Hyperthyroidism   Dementia without behavioral disturbance (HCC)   Renal cell carcinoma (HCC)   Dyslipidemia, goal LDL below 70   History of diabetes mellitus   CAD -S/P PCI 06/26/17  1.  Right kidney hematoma/post procedure complication of renal biopsy performed on April 20/renal cell carcinoma.  Evaluated by neurology and interventional radiology who recommended no emergent intervention and conservative management.  Of note patient currently has a plan for nephrectomy at a later date per Dr. Alyson Ingles.  Urology recommendation for IR embolization if patient becomes unstable or hemoglobin drops.  IR evaluated recommended no emergent angio given elevated creatinine as well as tumor ambo risks and noted if rebleeds can intervene.  Today nephrology indicates contrast study okay if needed to be done urgently. -Have asked interventional radiology to reevaluate given nephrology's new evaluation and the slight trend downward of hemoglobin -Otherwise urology plans nonemergent nephrectomy -Monitor  hemoglobin  2.  Acute blood loss anemia.  Presumably related to above.  Hemoglobin stable for 2 days and trending down today.  Query dilutional.  MCV 92. -See #1 -Monitor closely -Transfuse as indicated  #3.  Acute renal failure superimposed on chronic kidney disease stage IIIb.  Chart review indicates baseline creatinine 1.6.  Creatinine continues to trend downward with supportive care.  Evaluated by nephrology who opine not a dialysis candidate given her dementia and recommend continuation of supportive care -Hold nephrotoxins -Monitor urine output -Recheck in the morning  4.  Hypokalemia.  Mild. -Replete -Recheck in the a.m.  #5.  Diabetes type 2.  Hemoglobin A1c 6.2 on admission.  Diet controlled.  Serum glucose 198 this morning.  CBG range in the 130s.  Oral intake minimal. -Continue sliding scale  #6.  Hyperthyroidism. -continue methimazole  7.  Hypertension.  Controlled.  Home medications include amlodipine, metoprolol -Continue home meds  #8.  History of CAD status post CABG in 2006 as well as PCI 2019.  Denies chest pain.  Home medications include aspirin -Holding aspirin for now secondary to #1  #9.  Dyslipidemia.  Home medications include a statin -Continue to hold statin for now  #10.  Dementia.  Appears stable at baseline.  Home medications include Aricept -Continue home meds  Family Communication/Anticipated D/C date and plan/Code Status   DVT prophylaxis: scd Code Status: Full Code.  Family Communication: left message for son  Disposition Plan: Status is: Inpatient  Remains inpatient appropriate because:Inpatient level of care appropriate due to severity of illness   Dispo: The patient is from: Home              Anticipated d/c is to: Home  Anticipated d/c date is: 2 days              Patient currently is not medically stable to d/c.          Medical Consultants:    [facility] nephrology  [facility] urology  [place]  IR   Anti-Infectives:    None  Subjective:   Awake alert requesting water.  Denies pain or nausea  Objective:    Vitals:   08/03/19 1836 08/03/19 2233 08/04/19 0525 08/04/19 1023  BP: 137/63 (!) 126/52 (!) 145/75 (!) 141/65  Pulse: 89 86 85 72  Resp: 16 18 17 18   Temp: 99.7 F (37.6 C) 99.8 F (37.7 C) 99.3 F (37.4 C) 99.5 F (37.5 C)  TempSrc: Oral Oral  Oral  SpO2: 100% 99% 100% 98%    Intake/Output Summary (Last 24 hours) at 08/04/2019 1217 Last data filed at 08/04/2019 0900 Gross per 24 hour  Intake 1932.86 ml  Output 200 ml  Net 1732.86 ml   There were no vitals filed for this visit.  Exam: General: Awake alert thin somewhat frail no acute distress CV: Regular rate and rhythm no murmur gallop or rub no lower extremity edema Respiratory: No increased work of breathing breath sounds are clear bilaterally I hear no crackles no wheezes Abdomen: Soft nondistended positive bowel sounds throughout nontender to palpation no guarding or rebounding Musculoskeletal: Joints without swelling/erythema Neuro: Awake alert oriented to self only able to follow simple commands able to make wants and needs known  Data Reviewed:   I have personally reviewed following labs and imaging studies:  Labs: Labs show the following:   Basic Metabolic Panel: Recent Labs  Lab 08/02/19 0252 08/02/19 0252 08/02/19 1437 08/02/19 1437 08/03/19 0652 08/04/19 0808  NA 136  --  139  --  141 140  K 3.0*   < > 4.5   < > 3.9 3.3*  CL 101  --  103  --  108 109  CO2 20*  --  20*  --  16* 20*  GLUCOSE 186*  --  145*  --  105* 198*  BUN 96*  --  92*  --  91* 65*  CREATININE 6.75*  --  6.39*  --  5.95* 4.37*  CALCIUM 8.6*  --  9.1  --  8.9 8.6*  PHOS  --   --  5.1*  --   --   --    < > = values in this interval not displayed.   GFR Estimated Creatinine Clearance: 8.8 mL/min (A) (by C-G formula based on SCr of 4.37 mg/dL (H)). Liver Function Tests: Recent Labs  Lab 08/02/19 0252  08/02/19 1437 08/03/19 0652  AST 26  --  24  ALT 26  --  23  ALKPHOS 94  --  85  BILITOT 1.0  --  1.2  PROT 6.8  --  6.2*  ALBUMIN 3.4* 3.2* 2.9*   Recent Labs  Lab 08/02/19 0252  LIPASE 290*   No results for input(s): AMMONIA in the last 168 hours. Coagulation profile No results for input(s): INR, PROTIME in the last 168 hours.  CBC: Recent Labs  Lab 08/02/19 0252 08/02/19 1437 08/03/19 0652 08/04/19 0808  WBC 10.4  --  10.8* 7.9  NEUTROABS 7.9*  --   --   --   HGB 9.6* 10.4* 9.6* 8.7*  HCT 28.7* 30.4* 27.3* 26.0*  MCV 92.9  --  87.8 92.2  PLT 250  --  280 271  Cardiac Enzymes: Recent Labs  Lab 08/02/19 1437  CKTOTAL 90   BNP (last 3 results) No results for input(s): PROBNP in the last 8760 hours. CBG: Recent Labs  Lab 08/03/19 1653 08/03/19 2112 08/04/19 0648 08/04/19 1128  GLUCAP 135* 127* 130* 137*   D-Dimer: No results for input(s): DDIMER in the last 72 hours. Hgb A1c: Recent Labs    08/03/19 0652  HGBA1C 6.2*   Lipid Profile: No results for input(s): CHOL, HDL, LDLCALC, TRIG, CHOLHDL, LDLDIRECT in the last 72 hours. Thyroid function studies: No results for input(s): TSH, T4TOTAL, T3FREE, THYROIDAB in the last 72 hours.  Invalid input(s): FREET3 Anemia work up: No results for input(s): VITAMINB12, FOLATE, FERRITIN, TIBC, IRON, RETICCTPCT in the last 72 hours. Sepsis Labs: Recent Labs  Lab 08/02/19 0252 08/03/19 0652 08/04/19 0808  WBC 10.4 10.8* 7.9  LATICACIDVEN 1.0  --   --     Microbiology Recent Results (from the past 240 hour(s))  Respiratory Panel by RT PCR (Flu A&B, Covid) - Nasopharyngeal Swab     Status: None   Collection Time: 08/02/19  4:49 AM   Specimen: Nasopharyngeal Swab  Result Value Ref Range Status   SARS Coronavirus 2 by RT PCR NEGATIVE NEGATIVE Final    Comment: (NOTE) SARS-CoV-2 target nucleic acids are NOT DETECTED. The SARS-CoV-2 RNA is generally detectable in upper respiratoy specimens during the  acute phase of infection. The lowest concentration of SARS-CoV-2 viral copies this assay can detect is 131 copies/mL. A negative result does not preclude SARS-Cov-2 infection and should not be used as the sole basis for treatment or other patient management decisions. A negative result may occur with  improper specimen collection/handling, submission of specimen other than nasopharyngeal swab, presence of viral mutation(s) within the areas targeted by this assay, and inadequate number of viral copies (<131 copies/mL). A negative result must be combined with clinical observations, patient history, and epidemiological information. The expected result is Negative. Fact Sheet for Patients:  PinkCheek.be Fact Sheet for Healthcare Providers:  GravelBags.it This test is not yet ap proved or cleared by the [place] FDA and  has been authorized for detection and/or diagnosis of SARS-CoV-2 by FDA under an Emergency Use Authorization (EUA). This EUA will remain  in effect (meaning this test can be used) for the duration of the COVID-19 declaration under Section 564(b)(1) of the Act, 21 U.S.C. section 360bbb-3(b)(1), unless the authorization is terminated or revoked sooner.    Influenza A by PCR NEGATIVE NEGATIVE Final   Influenza B by PCR NEGATIVE NEGATIVE Final    Comment: (NOTE) The Xpert Xpress SARS-CoV-2/FLU/RSV assay is intended as an aid in  the diagnosis of influenza from Nasopharyngeal swab specimens and  should not be used as a sole basis for treatment. Nasal washings and  aspirates are unacceptable for Xpert Xpress SARS-CoV-2/FLU/RSV  testing. Fact Sheet for Patients: PinkCheek.be Fact Sheet for Healthcare Providers: GravelBags.it This test is not yet approved or cleared by the [place] FDA and  has been authorized for detection and/or diagnosis of SARS-CoV-2  by  FDA under an Emergency Use Authorization (EUA). This EUA will remain  in effect (meaning this test can be used) for the duration of the  Covid-19 declaration under Section 564(b)(1) of the Act, 21  U.S.C. section 360bbb-3(b)(1), unless the authorization is  terminated or revoked. Performed at [facility], [address] [place], [place] [address]     Procedures and diagnostic studies:  No results found.  Medications:   .  amLODipine  5 mg Oral Daily  . donepezil  10 mg Oral QHS  . insulin aspart  0-5 Units Subcutaneous QHS  . insulin aspart  0-6 Units Subcutaneous TID WC  . methimazole  5 mg Oral Daily  . metoprolol succinate  25 mg Oral Daily  . sodium chloride flush  3 mL Intravenous Q12H   Continuous Infusions: . sodium chloride 75 mL/hr at 08/04/19 0200     LOS: 2 days   Radene Gunning NP  [facility]   How to contact the [facility] Attending or Consulting provider [place] or covering provider during after hours [place], for this patient?  1. Check the care team in [facility] and look for a) attending/consulting [facility] provider listed and b) the [facility] team listed 2. Log into [email or web address] and use [place]'s universal password to access. If you do not have the password, please contact the hospital operator. 3. Locate the [facility] provider you are looking for under [facility] and page to a number that you can be directly reached. 4. If you still have difficulty reaching the provider, please page the [facility] (Director on Call) for the Hospitalists listed on amion for assistance.  08/04/2019, 12:17 PM

## 2019-08-04 NOTE — Progress Notes (Signed)
Pt was seen for initial mobility evaluation and note safety with RW is needed along with directional assist on RW.  Has not been using a walker per her report, and this device along with cognition are creating a need to assist more.  Pt is home with family per her report, cannot give details of the structure of assistance.  Follow up acutely to make progress with safety and independence, see if pt will need more than home therapy and 24/7 assistance.    08/04/19 1300  PT Visit Information  Last PT Received On 08/04/19  Assistance Needed +1  History of Present Illness 74 yo female with abd pain was admitted, had onset of post procedure R renal hematoma, anemia from blood loss and AKI following renal biopsy.  Original biopsy was 07/27/19. PMHx:   RLS, DM, HLD, NSTEMI, R renal mass, atherosclerosis, CKD3, HTN, dementia, osteopenia,CAD   Precautions  Precautions Fall  Precaution Comments impulsive  Restrictions  Weight Bearing Restrictions No  Home Living  Family/patient expects to be discharged to: Private residence  Living Arrangements Children  Available Help at Discharge Family;Available 24 hours/day  Type of Home House  Home Access Stairs to enter  Entrance Stairs-Number of Steps 4  Entrance Stairs-Rails Right;Left  Home Layout One level  Home Equipment None  Additional Comments pt has some confusion and is source of this information  Prior Function  Level of Independence Independent  Comments pt states she does not use AD  Communication  Communication No difficulties  Pain Assessment  Pain Assessment No/denies pain  Cognition  Arousal/Alertness Awake/alert  Behavior During Therapy Impulsive  Overall Cognitive Status History of cognitive impairments - at baseline  General Comments per chart son reports pt has memory issues  Upper Extremity Assessment  Upper Extremity Assessment Overall WFL for tasks assessed  Lower Extremity Assessment  Lower Extremity Assessment Generalized  weakness  Cervical / Trunk Assessment  Cervical / Trunk Assessment Kyphotic  Bed Mobility  General bed mobility comments up in chair when PT arrived  Transfers  Overall transfer level Needs assistance  Equipment used Rolling walker (2 wheeled);1 person hand held assist  Transfers Sit to/from Stand  Sit to Stand Min guard  General transfer comment pt is able to stand but min guard to maintain safety  Ambulation/Gait  Ambulation/Gait assistance Min guard  Gait Distance (Feet) 110 Feet  Assistive device Rolling walker (2 wheeled);1 person hand held assist  Gait Pattern/deviations Step-through pattern;Decreased stride length  General Gait Details pt is unfamiliar with walker and cued her for control of direction and managing obstacles  Gait velocity reduced  Gait velocity interpretation <1.31 ft/sec, indicative of household ambulator  Balance  Overall balance assessment Needs assistance  Sitting-balance support Feet supported  Sitting balance-Leahy Scale Good  Standing balance support Bilateral upper extremity supported;During functional activity  Standing balance-Leahy Scale Fair  Standing balance comment walker is needed as pt was HHA to take a few steps without AD intially  General Comments  General comments (skin integrity, edema, etc.) pt is not able to give a lot of detail, talks about family being home with her but cannot detail a schedule  Exercises  Exercises Other exercises (LE strength is 4/5)  PT - End of Session  Equipment Utilized During Treatment Gait belt  Activity Tolerance Treatment limited secondary to medical complications (Comment)  Patient left in chair;with call bell/phone within reach;with chair alarm set  Nurse Communication Mobility status  PT Assessment  PT Recommendation/Assessment Patient needs continued  PT services  PT Visit Diagnosis Unsteadiness on feet (R26.81);Muscle weakness (generalized) (M62.81);Difficulty in walking, not elsewhere classified  (R26.2)  PT Problem List Decreased strength;Decreased activity tolerance;Decreased range of motion;Decreased cognition;Decreased balance;Decreased safety awareness  Barriers to Discharge Inaccessible home environment  Barriers to Discharge Comments stairs to enter house  PT Plan  PT Frequency (ACUTE ONLY) Min 3X/week  PT Treatment/Interventions (ACUTE ONLY) DME instruction;Gait training;Stair training;Functional mobility training;Therapeutic activities;Balance training;Therapeutic exercise;Neuromuscular re-education;Patient/family education  AM-PAC PT "6 Clicks" Mobility Outcome Measure (Version 2)  Help needed turning from your back to your side while in a flat bed without using bedrails? 4  Help needed moving from lying on your back to sitting on the side of a flat bed without using bedrails? 3  Help needed moving to and from a bed to a chair (including a wheelchair)? 3  Help needed standing up from a chair using your arms (e.g., wheelchair or bedside chair)? 3  Help needed to walk in hospital room? 3  Help needed climbing 3-5 steps with a railing?  2  6 Click Score 18  Consider Recommendation of Discharge To: Home with [facility]  PT Recommendation  Follow Up Recommendations Home health PT;Supervision for mobility/OOB;Supervision/Assistance - 24 hour  PT equipment Rolling walker with 5" wheels  Individuals Consulted  Consulted and Agree with Results and Recommendations Patient unable/family or caregiver not available  Acute Rehab PT Goals  Patient Stated Goal to get home  PT Goal Formulation Patient unable to participate in goal setting  Time For Goal Achievement 08/11/19  Potential to Achieve Goals Good  PT Time Calculation  PT Start Time (ACUTE ONLY) 1201  PT Stop Time (ACUTE ONLY) 1228  PT Time Calculation (min) (ACUTE ONLY) 27 min  PT General Charges  $$ ACUTE PT VISIT 1 Visit  PT Evaluation  $PT Eval Moderate Complexity 1 Mod  PT Treatments  $Gait Training 8-22 mins  Written  Expression  Dominant Hand Right    Mee Hives, PT MS Acute Rehab Dept. Number: [place] [facility] [place]

## 2019-08-05 LAB — CBC
HCT: 26.4 % — ABNORMAL LOW (ref 36.0–46.0)
Hemoglobin: 8.9 g/dL — ABNORMAL LOW (ref 12.0–15.0)
MCH: 31 pg (ref 26.0–34.0)
MCHC: 33.7 g/dL (ref 30.0–36.0)
MCV: 92 fL (ref 80.0–100.0)
Platelets: 275 10*3/uL (ref 150–400)
RBC: 2.87 MIL/uL — ABNORMAL LOW (ref 3.87–5.11)
RDW: 12.2 % (ref 11.5–15.5)
WBC: 8.4 10*3/uL (ref 4.0–10.5)
nRBC: 0 % (ref 0.0–0.2)

## 2019-08-05 LAB — GLUCOSE, CAPILLARY
Glucose-Capillary: 111 mg/dL — ABNORMAL HIGH (ref 70–99)
Glucose-Capillary: 125 mg/dL — ABNORMAL HIGH (ref 70–99)
Glucose-Capillary: 145 mg/dL — ABNORMAL HIGH (ref 70–99)
Glucose-Capillary: 112 mg/dL — ABNORMAL HIGH (ref 70–99)
Glucose-Capillary: 136 mg/dL — ABNORMAL HIGH (ref 70–99)

## 2019-08-05 LAB — BASIC METABOLIC PANEL
Anion gap: 8 (ref 5–15)
BUN: 49 mg/dL — ABNORMAL HIGH (ref 8–23)
CO2: 21 mmol/L — ABNORMAL LOW (ref 22–32)
Calcium: 8.7 mg/dL — ABNORMAL LOW (ref 8.9–10.3)
Chloride: 113 mmol/L — ABNORMAL HIGH (ref 98–111)
Creatinine, Ser: 3.56 mg/dL — ABNORMAL HIGH (ref 0.44–1.00)
GFR calc Af Amer: 14 mL/min — ABNORMAL LOW (ref >60)
GFR calc non Af Amer: 12 mL/min — ABNORMAL LOW (ref >60)
Glucose, Bld: 124 mg/dL — ABNORMAL HIGH (ref 70–99)
Potassium: 3.7 mmol/L (ref 3.5–5.1)
Sodium: 142 mmol/L (ref 135–145)

## 2019-08-05 NOTE — Progress Notes (Signed)
[facility] Progress Note  PCP: [name], FNP Admit Date: 08/02/2019 Requesting Physician: Norval Morton, MD Reason for Consult:  Acute renal failure  Assessment/ Plan:   1. AKI CKD IIIb  Right Renal Hematoma  abdominal pain  patient's creatinine has continued to improve.  3.56 this morning from 4.37 yesterday with conservative management. -Continue conservative management -outpatient follow-up with urology for nonurgent nephrectomy -Daily RFP -d/c fluids, encourage PO intake  2. Anemia, acute Hemoglobin stable from 8.7 to 8.9 today.  - Continue to monitor- per primary team & IR -Hold home ASA  in setting of hematoma 3. Dementia - continue home aricept 10mg   4. Renal Cell Carcinoma, papillary, type I, nuclear grade 3  -defer to outpatient care 5. Hypertension  -Continue home toprol and amlodipine  6. HLD 7. Multinodular Goiter - methimazole  8. CAD - NSTEMI s/p PCI 06/2017, CABG 2006 -hold ASA 9. PET 02/2019: 4 cm splenic mass w/o uptake, likely benign. No metastatic disease   Subjective:   Doing well this morning no complaints.   Objective:   BP (!) 154/63 (BP Location: Left Arm)   Pulse 82   Temp 98.2 F (36.8 C)   Resp 15   SpO2 100%  Intake/Output      04/28 0701 - 04/29 0700 04/29 0701 - 04/30 0700   P.O. 620    I.V.     Other 0    Total Intake 620    Urine 400    Emesis/NG output 0    Stool 0    Blood     Total Output 400    Net +220         Urine Occurrence 5 x    Stool Occurrence 0 x    Emesis Occurrence 0 x     There were no vitals filed for this visit. Weight change:   Physical Exam: General: NAD, non-toxic, well-appearing, lying comfortably in bed  HEENT: [place]/AT. PERRLA. EOMI.  Cardiovascular: RRR, systolic murmur with minimal S1 and clear S2.  Respiratory: CTAB. No IWOB.  Abdomen: + BS. NT, ND, soft to palpation.  Extremities: Warm and well perfused. Moving spontaneously.  Integumentary: No obvious rashes,  lesions, trauma on general exam. Neuro: A & O x4. CN grossly intact. No FND   Imaging: CT ABDOMEN PELVIS WO CONTRAST Result Date: 08/02/2019 ~14 x 4.5 x 6 cm right subcapsular and perinephric hematoma. An underlying mass is partially obscured and included in this volume.   Labs: BMET Recent Labs  Lab 08/02/19 0252 08/02/19 1437 08/03/19 0652 08/04/19 0808 08/05/19 0438  NA 136 139 141 140 142  K 3.0* 4.5 3.9 3.3* 3.7  CL 101 103 108 109 113*  CO2 20* 20* 16* 20* 21*  GLUCOSE 186* 145* 105* 198* 124*  BUN 96* 92* 91* 65* 49*  CREATININE 6.75* 6.39* 5.95* 4.37* 3.56*  CALCIUM 8.6* 9.1 8.9 8.6* 8.7*  PHOS  --  5.1*  --   --   --    CBC Recent Labs  Lab 08/02/19 0252 08/02/19 0252 08/02/19 1437 08/03/19 0652 08/04/19 0808 08/05/19 0438  WBC 10.4  --   --  10.8* 7.9 8.4  NEUTROABS 7.9*  --   --   --   --   --   HGB 9.6*   < > 10.4* 9.6* 8.7* 8.9*  HCT 28.7*   < > 30.4* 27.3* 26.0* 26.4*  MCV 92.9  --   --  87.8 92.2 92.0  PLT  250  --   --  280 271 275   < > = values in this interval not displayed.    Medications:    . amLODipine  5 mg Oral Daily  . donepezil  10 mg Oral QHS  . insulin aspart  0-5 Units Subcutaneous QHS  . insulin aspart  0-6 Units Subcutaneous TID WC  . methimazole  5 mg Oral Daily  . metoprolol succinate  25 mg Oral Daily  . sodium chloride flush  3 mL Intravenous Q12H    Zettie Cooley, MD [place] Resident, PGY2 08/05/2019, 8:54 AM
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
## 2019-08-05 NOTE — Progress Notes (Signed)
Physical Therapy Treatment Patient Details Name: Erin Jordan MRN: [id number] DOB: 1945-10-13 Today's Date: 08/05/2019    History of Present Illness 74 yo female with abd pain was admitted, had onset of post procedure R renal hematoma, anemia from blood loss and AKI following renal biopsy.  Original biopsy was 07/27/19. PMHx:   RLS, DM, HLD, NSTEMI, R renal mass, atherosclerosis, CKD3, HTN, dementia, osteopenia,CAD     PT Comments    Pt demonstrating good progress today.  She was able to increase gait and perform stairs.  Needed min cues for safety.  Son present and encouraging pt.    Follow Up Recommendations  Home health PT;Supervision for mobility/OOB;Supervision/Assistance - 24 hour     Equipment Recommendations  Rolling walker with 5" wheels    Recommendations for Other Services       Precautions / Restrictions Precautions Precautions: Fall    Mobility  Bed Mobility Overal bed mobility: Needs Assistance Bed Mobility: Supine to Sit;Sit to Supine     Supine to sit: Supervision;HOB elevated Sit to supine: Supervision;HOB elevated   General bed mobility comments: increased time; HOB elevated  Transfers Overall transfer level: Needs assistance Equipment used: Rolling walker (2 wheeled) Transfers: Sit to/from Stand Sit to Stand: Supervision;Min guard         General transfer comment: Sit to stnad x 3 - min guard progressed to supervision; toielting adls with supervision  Ambulation/Gait Ambulation/Gait assistance: Min guard Gait Distance (Feet): 160 Feet Assistive device: Rolling walker (2 wheeled) Gait Pattern/deviations: Step-through pattern;Decreased stride length;Narrow base of support Gait velocity: reduced   General Gait Details: cued for increased BOS with turns and increased stride length; min guard for safety but no overt LOB   Stairs Stairs: Yes Stairs assistance: Min guard Stair Management: One rail Right;Forwards;Step to pattern Number of  Stairs: 5 General stair comments: cues for safety (step to pattern)   Wheelchair Mobility    Modified Rankin (Stroke Patients Only)       Balance Overall balance assessment: Needs assistance Sitting-balance support: Feet supported;No upper extremity supported Sitting balance-Leahy Scale: Good     Standing balance support: During functional activity;No upper extremity supported Standing balance-Leahy Scale: Good                              Cognition Arousal/Alertness: Awake/alert Behavior During Therapy: WFL for tasks assessed/performed Overall Cognitive Status: History of cognitive impairments - at baseline                                        Exercises      General Comments        Pertinent Vitals/Pain Pain Assessment: No/denies pain    Home Living                      Prior Function            PT Goals (current goals can now be found in the care plan section) Acute Rehab PT Goals Patient Stated Goal: to get home PT Goal Formulation: With patient/family Time For Goal Achievement: 08/11/19 Potential to Achieve Goals: Good Progress towards PT goals: Progressing toward goals    Frequency    Min 3X/week      PT Plan Current plan remains appropriate    Co-evaluation  AM-PAC PT "6 Clicks" Mobility   Outcome Measure  Help needed turning from your back to your side while in a flat bed without using bedrails?: None Help needed moving from lying on your back to sitting on the side of a flat bed without using bedrails?: None Help needed moving to and from a bed to a chair (including a wheelchair)?: None Help needed standing up from a chair using your arms (e.g., wheelchair or bedside chair)?: None Help needed to walk in hospital room?: None Help needed climbing 3-5 steps with a railing? : A Little 6 Click Score: 23    End of Session Equipment Utilized During Treatment: Gait belt Activity  Tolerance: Patient tolerated treatment well Patient left: in bed;with call bell/phone within reach;with bed alarm set Nurse Communication: Mobility status PT Visit Diagnosis: Unsteadiness on feet (R26.81);Muscle weakness (generalized) (M62.81);Difficulty in walking, not elsewhere classified (R26.2)     Time: 0947-0962 PT Time Calculation (min) (ACUTE ONLY): 23 min  Charges:  $Gait Training: 8-22 mins $Therapeutic Activity: 8-22 mins                     Maggie Font, PT Acute Rehab Services Pager [phone] [facility] [phone] [facility] [phone]    Karlton Lemon 08/05/2019, 5:59 PM

## 2019-08-05 NOTE — Plan of Care (Signed)
  Problem: Activity: Goal: Risk for activity intolerance will decrease Outcome: Progressing   

## 2019-08-05 NOTE — Progress Notes (Addendum)
Progress Note    Erin Jordan  RWE:[id number] DOB: [birth year]  DOA: 08/02/2019 PCP: [name], FNP    Brief Narrative:    Medical records reviewed and are as summarized below:  Erin Jordan is an 74 y.o. female with past medical history that includes hypertension, hyperlipidemia, chronic kidney disease stage III, dementia, right renal mass admitted April 26 with acute kidney injury related to hematomas develop status post renal biopsy on April 20 which confirmed renal cell carcinoma.  Evaluated by nephrology and interventional radiology who recommend no emergent interventions and conservative management.  A reevaluation by interventional radiology on April 28 with recommendations the same.  Her kidney function is improving but creatinine still elevated and while hemoglobin low no sign symptoms of active bleeding.  Assessment/Plan:   Principal Problem:   Postoperative surgical complication involving genitourinary system associated with genitourinary procedure Active Problems:   Hypokalemia   ARF (acute renal failure) (HCC)   Acute blood loss anemia   Renal hematoma   Essential hypertension   Hyperthyroidism   Dementia without behavioral disturbance (HCC)   Renal cell carcinoma (HCC)   Dyslipidemia, goal LDL below 70   History of diabetes mellitus   CAD -S/P PCI 06/26/17   #1.  Right kidney hematoma/post procedure complication of renal biopsy performed on April 20/renal cell carcinoma.  Evaluated by nephrology and interventional radiology recommend no emergent intervention and conservative management.  Kidney function improved and patient reevaluated by interventional radiology on April 28 who opine creatinine still elevated and no need for urgent intervention with no active bleeding.  Of note patient currently has a plan for nephrectomy at a later date.  Today her hemoglobin is stable from yesterday. -Appreciate nephrology's assistance -Monitor hemoglobin  #2.   Acute blood loss anemia.  Related to #1.  Hemoglobin today stable from yesterday. -Monitor -Transfuse as indicated  3.  Acute renal failure superimposed on chronic kidney disease.  Chart review indicates her baseline creatinine is 1.6.  Creatinine continues to trend down slowly.  Today creatinine 3.56 down from 4.37 yesterday.  Evaluated by nephrology who opted to stop IV fluids. -Monitor intake and output -Hold nephrotoxins -Recheck in the morning.  If creatinine continues to trend downward and hemoglobin stable will likely plan for discharge tomorrow.  #4.  Hypokalemia.  Repleted and resolved.  #5.  Diabetes type 2.  Hemoglobin A1c is 6.2.  Diet controlled. -Sliding scale  #6.  Hypothyroidism. -Continue home meds  #7.  Hypertension.  Controlled. -Continue home amlodipine and metoprolol  #8.  History of CAD status post CABG in 2006 as well as PCI 2019.  No chest pain.  Home medications include aspirin -Holding aspirin for now secondary to #2  #9.  Dyslipidemia -Holding statin for now  #10.  Dementia.  Appears stable at baseline   Family Communication/Anticipated D/C date and plan/Code Status   DVT prophylaxis: scd ordered. Code Status: Full Code.  Family Communication: spoke to  Son, updated and answered questions Disposition Plan: Status is: Inpatient  Remains inpatient appropriate because:Inpatient level of care appropriate due to severity of illness   Dispo: The patient is from: Home              Anticipated d/c is to: Home              Anticipated d/c date is: 1 day              Patient currently is not medically stable to d/c.  Medical Consultants:    None.   Anti-Infectives:    None  Subjective:   Lying in bed eyes closed.  Awakens to verbal stimuli.  Denies pain/discomfort  Objective:    Vitals:   08/04/19 1817 08/04/19 2048 08/05/19 0553 08/05/19 0947  BP: (!) 149/64 (!) 146/64 (!) 154/63 (!) 163/77  Pulse: 84 87 82 70  Resp:  16 19 15 18   Temp: 98.9 F (37.2 C) 97.9 F (36.6 C) 98.2 F (36.8 C) 99.2 F (37.3 C)  TempSrc: Oral   Oral  SpO2: 97% 100% 100% 100%    Intake/Output Summary (Last 24 hours) at 08/05/2019 1112 Last data filed at 08/05/2019 0900 Gross per 24 hour  Intake 800 ml  Output 400 ml  Net 400 ml   There were no vitals filed for this visit.  Exam: General: Awake but drowsy no acute distress CV: Regular rate and rhythm no murmur gallop or rub Respiratory: No increased work of breathing breath sounds are clear bilaterally I hear no wheeze no crackles Abdomen: Nondistended soft nontender to palpation no guarding or rebounding positive bowel sounds throughout Musculoskeletal: Joints without swelling/erythema Neuro drowsy but arousable to verbal stimuli oriented to self and place.  Able to follow simple commands.  Data Reviewed:   I have personally reviewed following labs and imaging studies:  Labs: Labs show the following:   Basic Metabolic Panel: Recent Labs  Lab 08/02/19 0252 08/02/19 0252 08/02/19 1437 08/02/19 1437 08/03/19 0652 08/03/19 0652 08/04/19 0808 08/04/19 1244 08/05/19 0438  NA 136  --  139  --  141  --  140  --  142  K 3.0*   < > 4.5   < > 3.9   < > 3.3*  --  3.7  CL 101  --  103  --  108  --  109  --  113*  CO2 20*  --  20*  --  16*  --  20*  --  21*  GLUCOSE 186*  --  145*  --  105*  --  198*  --  124*  BUN 96*  --  92*  --  91*  --  65*  --  49*  CREATININE 6.75*  --  6.39*  --  5.95*  --  4.37*  --  3.56*  CALCIUM 8.6*  --  9.1  --  8.9  --  8.6*  --  8.7*  MG  --   --   --   --   --   --   --  2.0  --   PHOS  --   --  5.1*  --   --   --   --   --   --    < > = values in this interval not displayed.   GFR Estimated Creatinine Clearance: 10.8 mL/min (A) (by C-G formula based on SCr of 3.56 mg/dL (H)). Liver Function Tests: Recent Labs  Lab 08/02/19 0252 08/02/19 1437 08/03/19 0652  AST 26  --  24  ALT 26  --  23  ALKPHOS 94  --  85  BILITOT 1.0   --  1.2  PROT 6.8  --  6.2*  ALBUMIN 3.4* 3.2* 2.9*   Recent Labs  Lab 08/02/19 0252  LIPASE 290*   No results for input(s): AMMONIA in the last 168 hours. Coagulation profile No results for input(s): INR, PROTIME in the last 168 hours.  CBC: Recent Labs  Lab 08/02/19 0252 08/02/19 1437  08/03/19 9326 08/04/19 0808 08/05/19 0438  WBC 10.4  --  10.8* 7.9 8.4  NEUTROABS 7.9*  --   --   --   --   HGB 9.6* 10.4* 9.6* 8.7* 8.9*  HCT 28.7* 30.4* 27.3* 26.0* 26.4*  MCV 92.9  --  87.8 92.2 92.0  PLT 250  --  280 271 275   Cardiac Enzymes: Recent Labs  Lab 08/02/19 1437  CKTOTAL 90   BNP (last 3 results) No results for input(s): PROBNP in the last 8760 hours. CBG: Recent Labs  Lab 08/04/19 1128 08/04/19 1658 08/04/19 2049 08/05/19 0639 08/05/19 0739  GLUCAP 137* 115* 147* 111* 125*   D-Dimer: No results for input(s): DDIMER in the last 72 hours. Hgb A1c: Recent Labs    08/03/19 0652  HGBA1C 6.2*   Lipid Profile: No results for input(s): CHOL, HDL, LDLCALC, TRIG, CHOLHDL, LDLDIRECT in the last 72 hours. Thyroid function studies: No results for input(s): TSH, T4TOTAL, T3FREE, THYROIDAB in the last 72 hours.  Invalid input(s): FREET3 Anemia work up: No results for input(s): VITAMINB12, FOLATE, FERRITIN, TIBC, IRON, RETICCTPCT in the last 72 hours. Sepsis Labs: Recent Labs  Lab 08/02/19 0252 08/03/19 0652 08/04/19 0808 08/05/19 0438  WBC 10.4 10.8* 7.9 8.4  LATICACIDVEN 1.0  --   --   --     Microbiology Recent Results (from the past 240 hour(s))  Respiratory Panel by RT PCR (Flu A&B, Covid) - Nasopharyngeal Swab     Status: None   Collection Time: 08/02/19  4:49 AM   Specimen: Nasopharyngeal Swab  Result Value Ref Range Status   SARS Coronavirus 2 by RT PCR NEGATIVE NEGATIVE Final    Comment: (NOTE) SARS-CoV-2 target nucleic acids are NOT DETECTED. The SARS-CoV-2 RNA is generally detectable in upper respiratoy specimens during the acute phase of  infection. The lowest concentration of SARS-CoV-2 viral copies this assay can detect is 131 copies/mL. A negative result does not preclude SARS-Cov-2 infection and should not be used as the sole basis for treatment or other patient management decisions. A negative result may occur with  improper specimen collection/handling, submission of specimen other than nasopharyngeal swab, presence of viral mutation(s) within the areas targeted by this assay, and inadequate number of viral copies (<131 copies/mL). A negative result must be combined with clinical observations, patient history, and epidemiological information. The expected result is Negative. Fact Sheet for Patients:  PinkCheek.be Fact Sheet for Healthcare Providers:  GravelBags.it This test is not yet ap proved or cleared by the [place] FDA and  has been authorized for detection and/or diagnosis of SARS-CoV-2 by FDA under an Emergency Use Authorization (EUA). This EUA will remain  in effect (meaning this test can be used) for the duration of the COVID-19 declaration under Section 564(b)(1) of the Act, 21 U.S.C. section 360bbb-3(b)(1), unless the authorization is terminated or revoked sooner.    Influenza A by PCR NEGATIVE NEGATIVE Final   Influenza B by PCR NEGATIVE NEGATIVE Final    Comment: (NOTE) The Xpert Xpress SARS-CoV-2/FLU/RSV assay is intended as an aid in  the diagnosis of influenza from Nasopharyngeal swab specimens and  should not be used as a sole basis for treatment. Nasal washings and  aspirates are unacceptable for Xpert Xpress SARS-CoV-2/FLU/RSV  testing. Fact Sheet for Patients: PinkCheek.be Fact Sheet for Healthcare Providers: GravelBags.it This test is not yet approved or cleared by the [place] FDA and  has been authorized for detection and/or diagnosis of SARS-CoV-2 by  FDA under  an Emergency Use Authorization (EUA). This EUA will remain  in effect (meaning this test can be used) for the duration of the  Covid-19 declaration under Section 564(b)(1) of the Act, 21  U.S.C. section 360bbb-3(b)(1), unless the authorization is  terminated or revoked. Performed at [facility], [address], [place], [place] [address]     Procedures and diagnostic studies:  No results found.  Medications:   . amLODipine  5 mg Oral Daily  . donepezil  10 mg Oral QHS  . insulin aspart  0-5 Units Subcutaneous QHS  . insulin aspart  0-6 Units Subcutaneous TID WC  . methimazole  5 mg Oral Daily  . metoprolol succinate  25 mg Oral Daily  . sodium chloride flush  3 mL Intravenous Q12H   Continuous Infusions:   LOS: 3 days   Radene Gunning NP  [facility]   How to contact the [facility] Attending or Consulting provider [place] or covering provider during after hours [place], for this patient?  1. Check the care team in [facility] and look for a) attending/consulting [facility] provider listed and b) the [facility] team listed 2. Log into [email or web address] and use 's universal password to access. If you do not have the password, please contact the hospital operator. 3. Locate the [facility] provider you are looking for under [facility] and page to a number that you can be directly reached. 4. If you still have difficulty reaching the provider, please page the [facility] (Director on Call) for the Hospitalists listed on amion for assistance.  08/05/2019, 11:12 AM

## 2019-08-06 ENCOUNTER — Inpatient Hospital Stay (HOSPITAL_COMMUNITY): Payer: Medicare HMO

## 2019-08-06 LAB — BASIC METABOLIC PANEL
Anion gap: 9 (ref 5–15)
BUN: 40 mg/dL — ABNORMAL HIGH (ref 8–23)
CO2: 20 mmol/L — ABNORMAL LOW (ref 22–32)
Calcium: 8.9 mg/dL (ref 8.9–10.3)
Chloride: 111 mmol/L (ref 98–111)
Creatinine, Ser: 2.82 mg/dL — ABNORMAL HIGH (ref 0.44–1.00)
GFR calc Af Amer: 18 mL/min — ABNORMAL LOW (ref >60)
GFR calc non Af Amer: 16 mL/min — ABNORMAL LOW (ref >60)
Glucose, Bld: 128 mg/dL — ABNORMAL HIGH (ref 70–99)
Potassium: 3.5 mmol/L (ref 3.5–5.1)
Sodium: 140 mmol/L (ref 135–145)

## 2019-08-06 LAB — CBC
HCT: 29 % — ABNORMAL LOW (ref 36.0–46.0)
Hemoglobin: 9.8 g/dL — ABNORMAL LOW (ref 12.0–15.0)
MCH: 30.9 pg (ref 26.0–34.0)
MCHC: 33.8 g/dL (ref 30.0–36.0)
MCV: 91.5 fL (ref 80.0–100.0)
Platelets: 315 10*3/uL (ref 150–400)
RBC: 3.17 MIL/uL — ABNORMAL LOW (ref 3.87–5.11)
RDW: 11.9 % (ref 11.5–15.5)
WBC: 9.2 10*3/uL (ref 4.0–10.5)
nRBC: 0 % (ref 0.0–0.2)

## 2019-08-06 LAB — GLUCOSE, CAPILLARY
Glucose-Capillary: 117 mg/dL — ABNORMAL HIGH (ref 70–99)
Glucose-Capillary: 139 mg/dL — ABNORMAL HIGH (ref 70–99)

## 2019-08-06 IMAGING — CT CT ABD-PELV W/O CM
2 of 4 series · 16 of 46 positions shown, 18 images · non-contrast
Comparison: CT of the abdomen and pelvis without contrast on
[DATE]

CLINICAL DATA: Acute right-sided subcapsular and perinephric
hemorrhage after biopsy a roughly 3.5 cm right renal mass on
[DATE].

EXAM:
CT ABDOMEN AND PELVIS WITHOUT CONTRAST
TECHNIQUE: Multidetector CT imaging of the abdomen and pelvis was performed
following the standard protocol without IV contrast.

[Series 3: a/p w/o 5mm · axial · non-contrast · 0.60mm/px · z∈[-539,-159]mm · 13 of 84 slices shown, 15 images]
[im 4/84  soft-tissue]
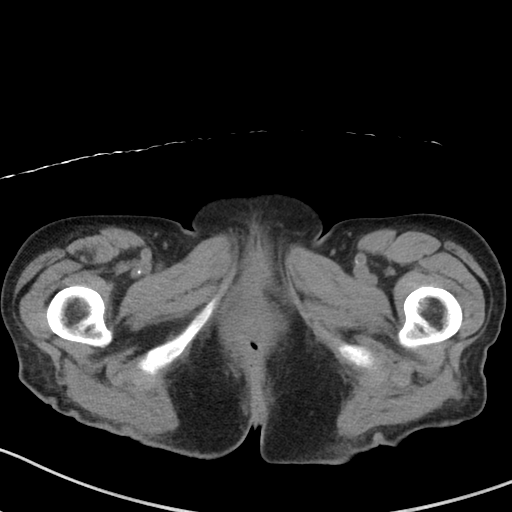
[im 4/84  bone]
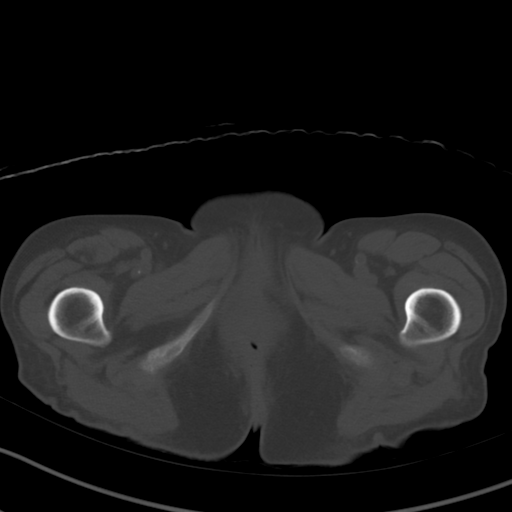
[im 10/84  soft-tissue]
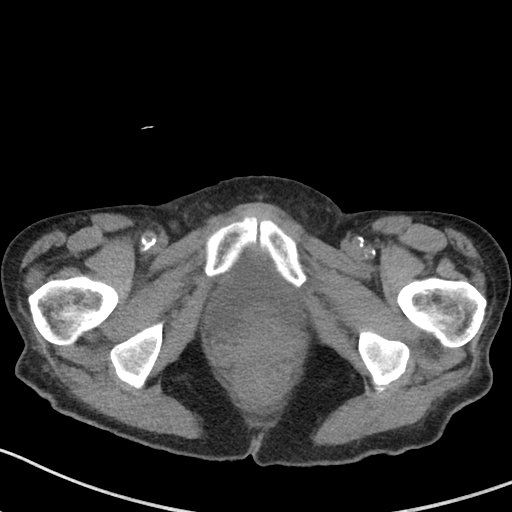
[im 17/84  soft-tissue]
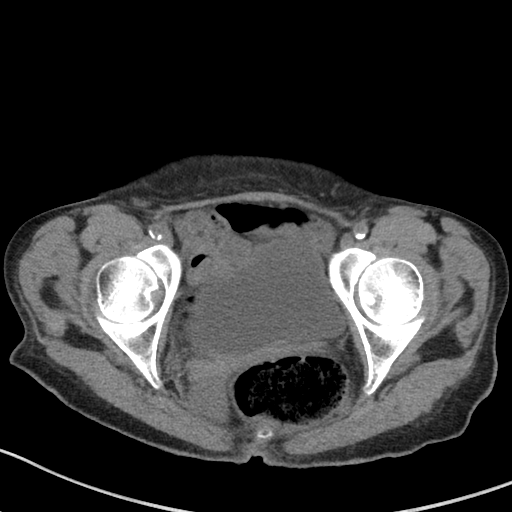
[im 24/84  soft-tissue]
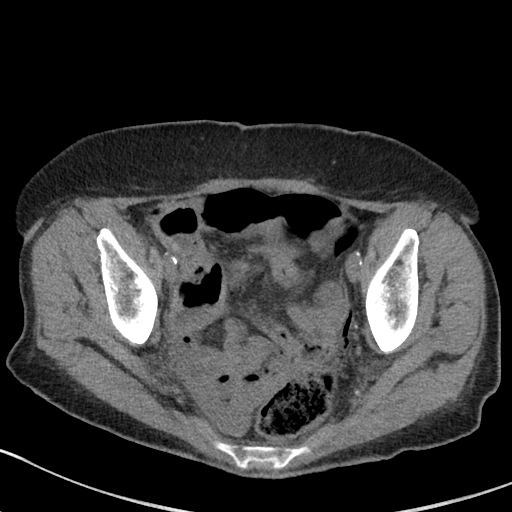
[im 30/84  soft-tissue]
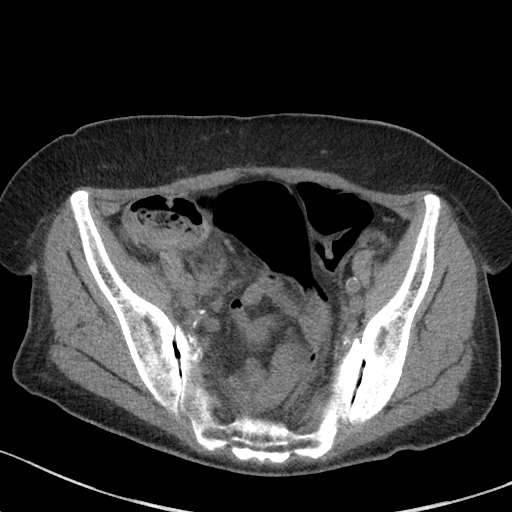
[im 37/84  soft-tissue]
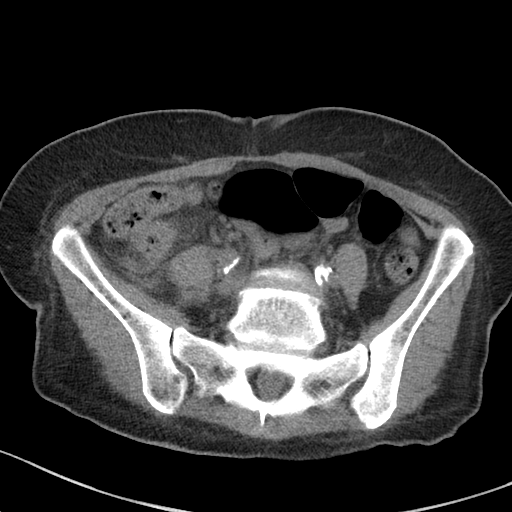
[im 44/84  soft-tissue]
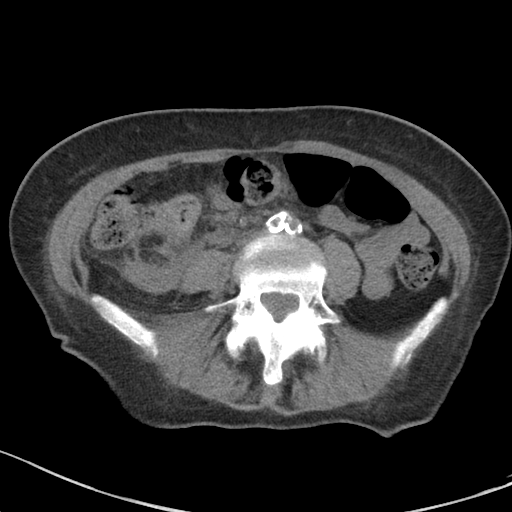
[im 47/84  soft-tissue]
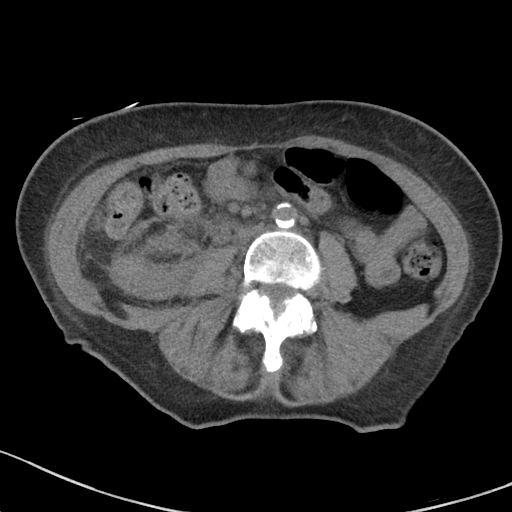
[im 54/84  soft-tissue]
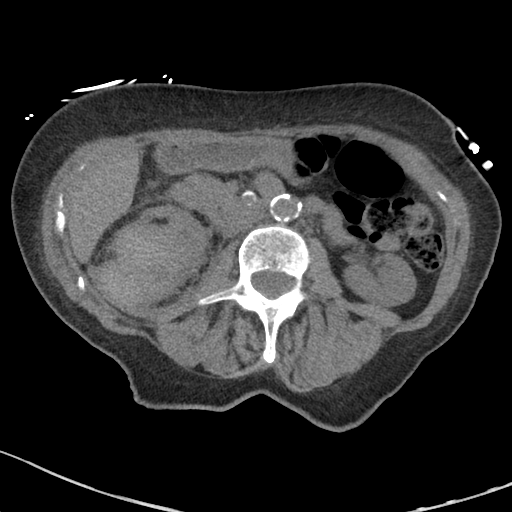
[im 54/84  bone]
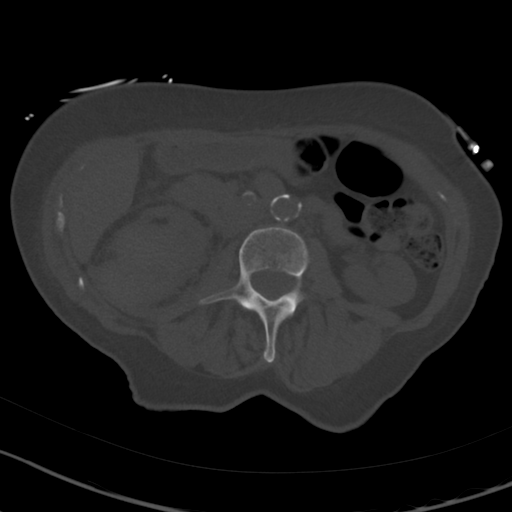
[im 60/84  soft-tissue]
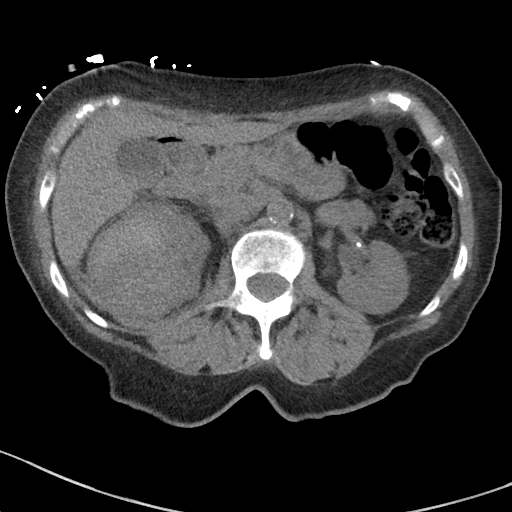
[im 67/84  soft-tissue]
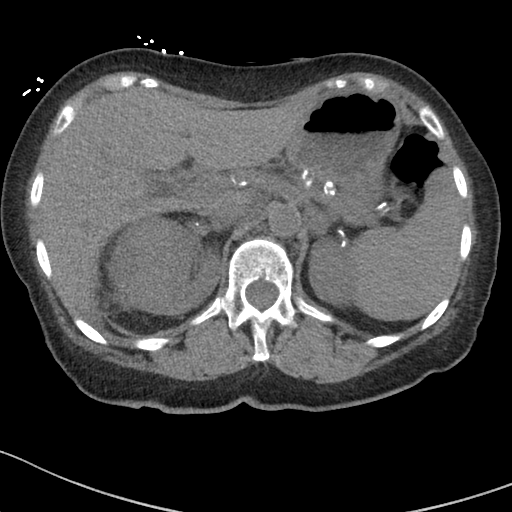
[im 74/84  soft-tissue]
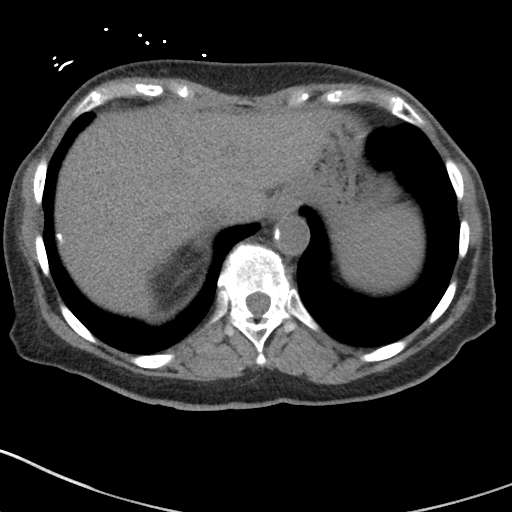
[im 80/84  soft-tissue]
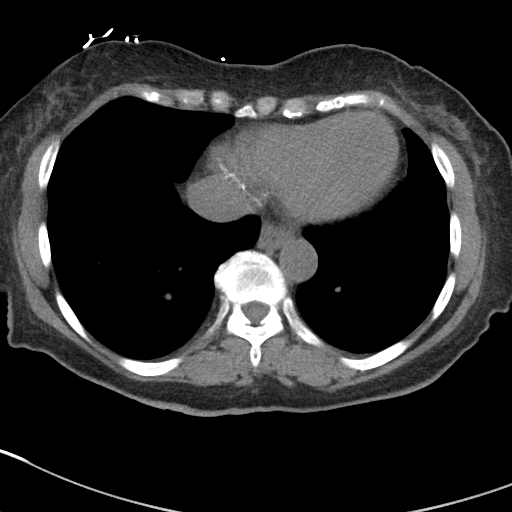

[Series 6: a/p w/o cor · coronal · non-contrast · 0.60mm/px · 3 of 126 slices shown]
[im 42/126  soft-tissue]
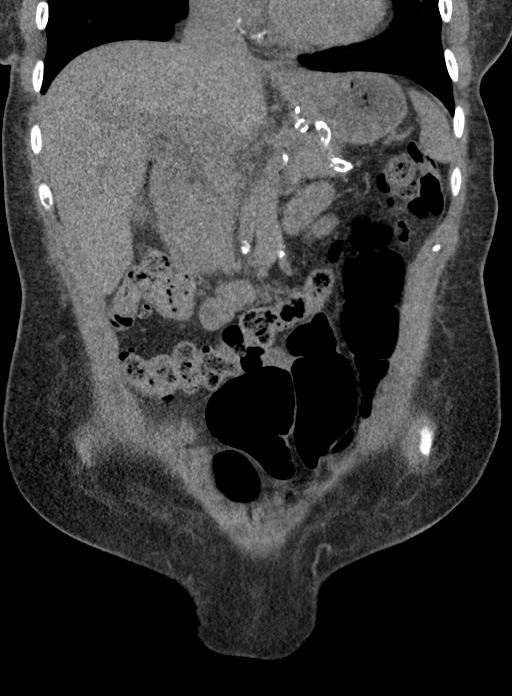
[im 56/126  soft-tissue]
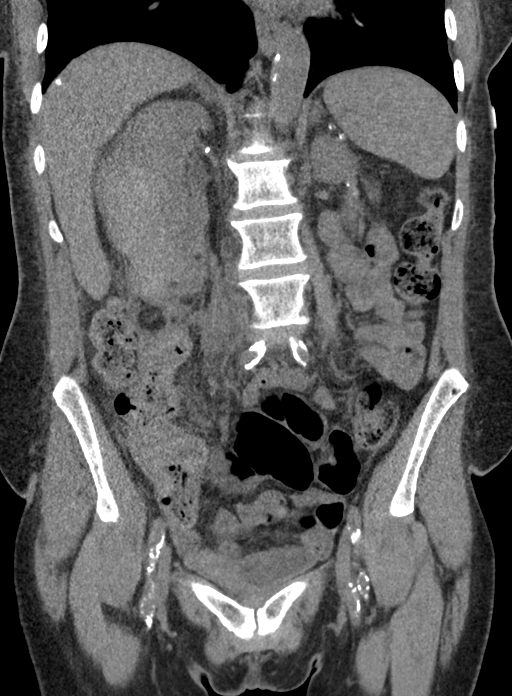
[im 70/126  soft-tissue]
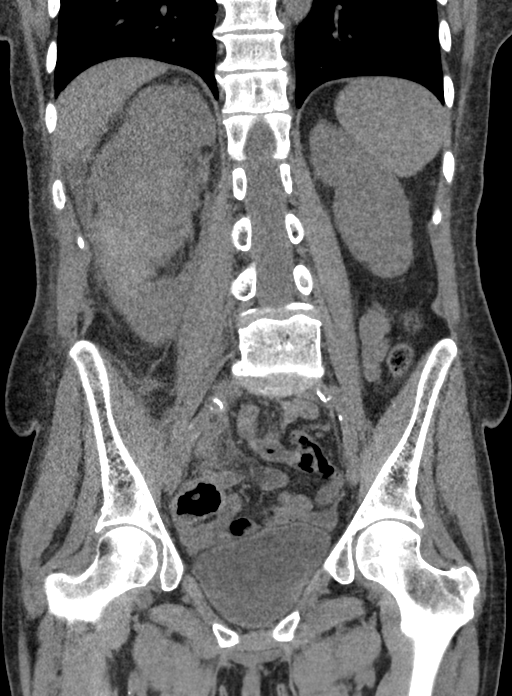

[16 of 46 positions shown; findings below may reference images not displayed]

FINDINGS: Lower chest: No acute abnormality.

Hepatobiliary: No focal liver abnormality is seen. No gallstones,
gallbladder wall thickening, or biliary dilatation.

Pancreas: Unremarkable. No pancreatic ductal dilatation or
surrounding inflammatory changes.

Spleen: Normal in size without focal abnormality.

Adrenals/Urinary Tract: Right renal hemorrhage predominantly in a
lateral subcapsular location appears stable to slightly smaller in
volume and measures approximately 12.9 x 5.3 x 5.7 cm. The hematoma
is also slightly less dense overall than on the prior study. No new
areas of hemorrhage are identified. There is no evidence of
hydronephrosis of the right kidney. The left kidney appears stable.
The bladder is unremarkable.

Stomach/Bowel: Bowel shows no evidence of obstruction, ileus,
inflammation or lesion. No free air.

Vascular/Lymphatic: Stable vascular calcifications. No enlarged
lymph nodes.

Reproductive: Status post hysterectomy. No adnexal masses.

Other: No hernias. No ascites.

Musculoskeletal: No acute or significant osseous findings.
IMPRESSION: Stable to slightly smaller volume of right renal hemorrhage
predominantly in a lateral subcapsular location. The hematoma is
also slightly less dense overall than on the prior study. No new
areas of hemorrhage are identified.

## 2019-08-06 NOTE — TOC Transition Note (Signed)
Transition of Care [facility]) - CM/SW Discharge Note   Patient Details  Name: Erin Jordan MRN: [id number] Date of Birth: 1945-09-10  Transition of Care [facility]) CM/SW Contact:  Bartholomew Crews, RN Phone Number: [phone] 08/06/2019, 2:10 PM   Clinical Narrative:     Patient to transition home today. Walker delivered to bedside this AM. Kaylyn Layer of [place] orders for PT and SW - Alvis Lemmings will follow up in 2 weeks with patient. No other [place] agency available. Both patient and daughter, [name], aware of the delay in start of care for [facility]. No further TOC needs identified.   Final next level of care: [place] Barriers to Discharge: No Barriers Identified   Patient Goals and CMS Choice Patient states their goals for this hospitalization and ongoing recovery are:: return home with family CMS Medicare.gov Compare Post Acute Care list provided to:: Patient Represenative (must comment) Choice offered to / list presented to : Adult Children  Discharge Placement                       Discharge Plan and Services In-house Referral: NA Discharge Planning Services: CM Consult Post Acute Care Choice: Home Health          DME Arranged: Gilford Rile DME Agency: [employer] Date DME Agency Contacted: 08/06/19 Time DME Agency Contacted: 0900 Representative spoke with at DME Agency: Darrall Dears Arranged: PT, Social Work   Date [place]: 08/06/19 Time [place]: 1410 Representative spoke with at B and E: [place] ([place]) Interventions     Readmission Risk Interventions No flowsheet data found.

## 2019-08-06 NOTE — Progress Notes (Signed)
[facility] Progress Note  PCP: [name], FNP Admit Date: 08/02/2019 Requesting Physician: Norval Morton, MD Reason for Consult:  Acute renal failure  Assessment/ Plan:    Acute kidney injury - secondary to hematoma following a renal biopsy - improving with supportive care  Right renal hematoma - urology plans nonemergent nephrectomy  Renal cell carcinoma - new diagnosis   CKD stage III - prev followed with outside practice; baseline may be Cr 1.6  Acute blood loss anemia - post-procedure hematoma; some component of dilution   Dementia - noted at baseline as above  HTN - acceptable  Disposition per primary team.  Nephrology will sign off.  Setting of patient with dementia.  Would repeat BMP with PCP on close follow-up and renal is able to see in follow-up if needed   Subjective:   Feels ok today.  Has been off of IV fluids.    Review of systems:  Limited 2/2 dementia; denies shortness of breath Denies n/v States no pain  Objective:   BP 140/65 (BP Location: Right Arm)   Pulse 78   Temp 98.9 F (37.2 C)   Resp 18   Wt 52.4 kg   SpO2 100%   BMI 21.13 kg/m  Intake/Output      04/29 0701 - 04/30 0700 04/30 0701 - 05/01 0700   P.O. 1140    Other     Total Intake(mL/kg) 1140 (21.8)    Urine (mL/kg/hr) 1850 (1.5)    Emesis/NG output     Stool 0    Total Output 1850    Net -710         Urine Occurrence 3 x    Stool Occurrence 0 x     Filed Weights   08/05/19 2111  Weight: 52.4 kg   Weight change:   Physical Exam: General adult female in bed in no acute distress HEENT normocephalic atraumatic Neck supple trachea midline Lungs clear to auscultation bilaterally normal work of breathing at rest  Heart S1S2 no rub Abdomen soft nontender on exam today nondistended Extremities no edema  Psych calm mood and affect Neuro - oriented to person; awake on exam; not aware of year  Imaging: CT ABDOMEN PELVIS WO CONTRAST Result Date:  08/02/2019 ~14 x 4.5 x 6 cm right subcapsular and perinephric hematoma. An underlying mass is partially obscured and included in this volume.   Labs: BMET Recent Labs  Lab 08/02/19 0252 08/02/19 1437 08/03/19 0652 08/04/19 0808 08/05/19 0438 08/06/19 0641  NA 136 139 141 140 142 140  K 3.0* 4.5 3.9 3.3* 3.7 3.5  CL 101 103 108 109 113* 111  CO2 20* 20* 16* 20* 21* 20*  GLUCOSE 186* 145* 105* 198* 124* 128*  BUN 96* 92* 91* 65* 49* 40*  CREATININE 6.75* 6.39* 5.95* 4.37* 3.56* 2.82*  CALCIUM 8.6* 9.1 8.9 8.6* 8.7* 8.9  PHOS  --  5.1*  --   --   --   --    CBC Recent Labs  Lab 08/02/19 0252 08/02/19 1437 08/03/19 0652 08/04/19 0808 08/05/19 0438 08/06/19 0641  WBC 10.4  --  10.8* 7.9 8.4 9.2  NEUTROABS 7.9*  --   --   --   --   --   HGB 9.6*   < > 9.6* 8.7* 8.9* 9.8*  HCT 28.7*   < > 27.3* 26.0* 26.4* 29.0*  MCV 92.9  --  87.8 92.2 92.0 91.5  PLT 250  --  280 271 275  315   < > = values in this interval not displayed.    Medications:    . amLODipine  5 mg Oral Daily  . donepezil  10 mg Oral QHS  . insulin aspart  0-5 Units Subcutaneous QHS  . insulin aspart  0-6 Units Subcutaneous TID WC  . methimazole  5 mg Oral Daily  . metoprolol succinate  25 mg Oral Daily  . sodium chloride flush  3 mL Intravenous Q12H   Claudia Desanctis, MD 08/06/2019 11:00 AM

## 2019-08-06 NOTE — Progress Notes (Signed)
Spoke to [employer] ,made him aware that patient has pending discharge order today.

## 2019-08-06 NOTE — TOC Progression Note (Signed)
Transition of Care [facility]) - Progression Note    Patient Details  Name: Erin Jordan MRN: [id number] Date of Birth: [birth year]  [facility]) CM/SW Contact  Bartholomew Crews, RN Phone Number: [phone] 08/06/2019, 11:15 AM  Clinical Narrative:     Spoke with patient at the bedside. Discussed discharge planning. RW delivered to the room this AM. TOC following for transition needs.   Expected Discharge Plan: [place] Barriers to Discharge: Continued Medical Work up  Expected Discharge Plan and Services Expected Discharge Plan: [place] In-house Referral: NA Discharge Planning Services: CM Consult Post Acute Care Choice: [place] arrangements for the past 2 months: Single Family Home                 DME Arranged: Gilford Rile DME Agency: [employer] Date DME Agency Contacted: 08/05/19 Time DME Agency Contacted: [phone] Representative spoke with at DME Agency: [place]: PT, Social Work           Social Determinants of Health ([place]) Interventions    Readmission Risk Interventions No flowsheet data found.

## 2019-08-06 NOTE — Plan of Care (Signed)
  Problem: Clinical Measurements: Goal: Respiratory complications will improve Outcome: Adequate for Discharge   

## 2019-08-06 NOTE — Progress Notes (Addendum)
Patient admitted 4/26 for renal hematoma following renal mass biopsy in IR on 4/20 - consulted upon admission for possible angio/embolization however decision was made to hold off on procedure unless evidence of rebleeding was noted.  Per chart review patient has remained hemodynamically stable, hgb/plt stable over last 72H without noted evidence of overt bleeding. Creatinine improved to 2.82 today.   Patient discussed with IR attending today (Dr. Kathlene Cote) who recommends CT abd/pelvis w/o contrast prior to discharge (ordered for today). She will need a CBC/BMP approximately 1-2 weeks post discharge and follow up in IR clinic 3-4 weeks post discharge. I have placed orders for patient to be seen in our clinic - IR scheduler will call patient with appointment date/time.  IR will review CT today once complete and provide further recommendations if needed. Please call with questions or concerns.  Candiss Norse, PA-C Pager# [phone]  ADDENDUM 1335 - CT A/P completed and reviewed by IR attending. Collection appears stable (possibly smaller) and is lower density. Reviewed patient history and recent imaging with IR physician who had previously been following patient, as she is planned for outpatient nephrectomy she does not need to be seen in IR clinic at this time. Orders for outpatient follow up have been cancelled. Patient ok for d/c from IR perspective, primary team aware - please call with any further questions or concerns.

## 2019-08-06 NOTE — Plan of Care (Signed)
  Problem: Activity: Goal: Risk for activity intolerance will decrease Outcome: Progressing   Problem: Nutrition: Goal: Adequate nutrition will be maintained Outcome: Progressing   Problem: Coping: Goal: Level of anxiety will decrease Outcome: Progressing   

## 2019-08-06 NOTE — Discharge Summary (Signed)
Physician Discharge Summary  Erin Jordan CZY:[id number] DOB: [birth year] DOA: 08/02/2019  PCP: [name], FNP  Admit date: 08/02/2019 Discharge date: 08/06/2019  Admitted From: home Discharge disposition: home   Recommendations for Outpatient Follow-Up:   1. Follow-up with primary care provider 1-2 week.  Recommend complete blood count to evaluate hemoglobin and bmet to evaluate kidney function 2. Keep all other scheduled appointments   Discharge Diagnosis:   Principal Problem:   Postoperative surgical complication involving genitourinary system associated with genitourinary procedure Active Problems:   Hypokalemia   ARF (acute renal failure) (HCC)   Acute blood loss anemia   Renal hematoma   Essential hypertension   Hyperthyroidism   Dementia without behavioral disturbance (HCC)   Renal cell carcinoma (HCC)   Dyslipidemia, goal LDL below 70   History of diabetes mellitus   CAD -S/P PCI 06/26/17    Discharge Condition: Improved.  Diet recommendation: Low sodium, heart healthy.  Carbohydrate-modified.   Wound care: None.  Code status: Full.   History of Present Illness:   Erin Jordan is a 74 y.o. female with medical history significant of hypertension, hyperlipidemia, CKD stage III, dementia, and right renal mass presented April 26/21 with complaints of right-sided abdominal pain.  History  obtained from the patient with the assistance of her son who was present at bedside as  patient has some issues with memory.  Apparently, patient felt something pop on her right side the night prior to presentation.  She described having a sharp pain felt like a pin sticking her.  Possibly noted having decreased urine output.  She had just recently had a CT-guided biopsy of a mass of her right side of her kidney 6 days prior.  Following the procedure patient  initially not had any issues.  Once home she had been doing fine until day prior to presentation.  Denied  having any recent falls or trauma.  Review of records from pathology report revealed renal cell carcinoma Upon presentation to the emergency department patient was seen to be afebrile, blood pressure 114/62-160/69, and all other vital signs within normal limits.  Labs significant for hemoglobin 9.6 (hemoglobin 13.9 on 4/20), creatinine 6.75(creatinine 1.6 4/20), BUN 96, and lipase 290.  COVID-19 and influenza screening negative.  Patient was given 500 mL of normal saline IV fluids.  IR and nephrology consulted.  Patient was transferred from [facility] to [facility] for need of IR services.  TRH called to admit.  Hospital Course by Problem:   #1.  Right kidney hematoma/post procedure complication of renal biopsy performed on April 20/renal cell carcinoma.  Evaluated by nephrology and interventional radiology recommend no emergent intervention and conservative management.  Kidney function improved and patient reevaluated by interventional radiology on April 28 who opine creatinine still elevated and no need for urgent intervention with no active bleeding.  Of note patient currently has a plan for nephrectomy at a later date. Day of discharge CT abdomen pelvis revealing hematoma smaller and less dense. No further IR follow up needed.   #2.  Acute blood loss anemia.  Related to #1.  Hemoglobin stable 72 hours. Recommend cbc 1-2 weeks to track hg. Continue to hold asa  3.  Acute renal failure superimposed on chronic kidney disease.  Chart review indicates her baseline creatinine is 1.6. continues to trend down. On day of discharge creatinine 2.8 from 6.3 on admission. Evaluated by nephrology recommends bmet in 1-2 weeks to track kidney function.   #  4.  Hypokalemia.  Repleted and resolved.  #5.  Diabetes type 2.  Hemoglobin A1c is 6.2.  Diet controlled.  #6.  Hypothyroidism. Continue home meds  #7.  Hypertension.  Controlled. Continue home amlodipine and metoprolol  #8.  History of CAD status  post CABG in 2006 as well as PCI 2019.  No chest pain.  Home medications include aspirin. Holding aspirin for now secondary to #2  #9.  Dyslipidemia   #10.  Dementia.  Remained stable at baseline     Medical Consultants:   Dr. Royce Macadamia nephrology Dr. Kathlene Cote IR  Discharge Exam:   Vitals:   08/06/19 0424 08/06/19 0919  BP: (!) 158/64 140/65  Pulse: 75 78  Resp:  18  Temp:  98.9 F (37.2 C)  SpO2:  100%   Vitals:   08/06/19 0221 08/06/19 0423 08/06/19 0424 08/06/19 0919  BP:  (!) 144/112 (!) 158/64 140/65  Pulse:  76 75 78  Resp:  16  18  Temp: 98.4 F (36.9 C) 98.4 F (36.9 C)  98.9 F (37.2 C)  TempSrc: Oral Oral    SpO2:  100%  100%  Weight:        General exam: Appears calm and comfortable. No acute distress Respiratory system: Clear to auscultation. Respiratory effort normal. Cardiovascular system: S1 & S2 heard, RRR. No JVD,  rubs, gallops or clicks. No murmurs. Gastrointestinal system: Abdomen is nondistended, soft and nontender. No organomegaly or masses felt. Normal bowel sounds heard. Central nervous system: Alert and oriented. No focal neurological deficits. Extremities: No clubbing,  or cyanosis. No edema. Skin: No rashes, lesions or ulcers. Psychiatry: Judgement and insight appear normal. Mood & affect appropriate.    The results of significant diagnostics from this hospitalization (including imaging, microbiology, ancillary and laboratory) are listed below for reference.     Procedures and Diagnostic Studies:   CT ABDOMEN PELVIS WO CONTRAST  Result Date: 08/02/2019 CLINICAL DATA:  Recent kidney biopsy with sharp abdominal pain. EXAM: CT ABDOMEN AND PELVIS WITHOUT CONTRAST TECHNIQUE: Multidetector CT imaging of the abdomen and pelvis was performed following the standard protocol without IV contrast. COMPARISON:  Six days ago FINDINGS: Lower chest:  Coronary atherosclerosis.  No acute finding Hepatobiliary: No focal liver abnormality.No evidence of  biliary obstruction or stone. Pancreas: Unremarkable. Spleen: Occult mass when compared with MRI. Interval subcapsular hemorrhage deforming the cortex and decompressing into the inferior perirenal space, with overall hematoma dimensions of 14 x 4.5 x 5.8 cm. A superimposed mass is largely obscured and is included in this measurement. Negative left kidney. Unremarkable bladder. Stomach/Bowel:  No obstruction. No appendicitis. Vascular/Lymphatic: No acute vascular abnormality. No mass or adenopathy. Reproductive:Hysterectomy. Other: No ascites or pneumoperitoneum. Musculoskeletal: No acute abnormalities. Lower lumbar facet arthropathy with L4-5 anterolisthesis. These results were called by telephone at the time of interpretation on 08/02/2019 at 4:37 am to provider [facility] , who verbally acknowledged these results. IMPRESSION: ~14 x 4.5 x 6 cm right subcapsular and perinephric hematoma. An underlying mass is partially obscured and included in this volume. Electronically Signed   By: Monte Fantasia M.D.   On: 08/02/2019 04:39     Labs:   Basic Metabolic Panel: Recent Labs  Lab 08/02/19 1437 08/02/19 1437 08/03/19 6761 08/03/19 9509 08/04/19 0808 08/04/19 0808 08/04/19 1244 08/05/19 0438 08/06/19 0641  NA 139  --  141  --  140  --   --  142 140  K 4.5   < > 3.9   < >  3.3*   < >  --  3.7 3.5  CL 103  --  108  --  109  --   --  113* 111  CO2 20*  --  16*  --  20*  --   --  21* 20*  GLUCOSE 145*  --  105*  --  198*  --   --  124* 128*  BUN 92*  --  91*  --  65*  --   --  49* 40*  CREATININE 6.39*  --  5.95*  --  4.37*  --   --  3.56* 2.82*  CALCIUM 9.1  --  8.9  --  8.6*  --   --  8.7* 8.9  MG  --   --   --   --   --   --  2.0  --   --   PHOS 5.1*  --   --   --   --   --   --   --   --    < > = values in this interval not displayed.   GFR Estimated Creatinine Clearance: 13.6 mL/min (A) (by C-G formula based on SCr of 2.82 mg/dL (H)). Liver Function Tests: Recent Labs  Lab  08/02/19 0252 08/02/19 1437 08/03/19 0652  AST 26  --  24  ALT 26  --  23  ALKPHOS 94  --  85  BILITOT 1.0  --  1.2  PROT 6.8  --  6.2*  ALBUMIN 3.4* 3.2* 2.9*   Recent Labs  Lab 08/02/19 0252  LIPASE 290*   No results for input(s): AMMONIA in the last 168 hours. Coagulation profile No results for input(s): INR, PROTIME in the last 168 hours.  CBC: Recent Labs  Lab 08/02/19 0252 08/02/19 0252 08/02/19 1437 08/03/19 0652 08/04/19 0808 08/05/19 0438 08/06/19 0641  WBC 10.4  --   --  10.8* 7.9 8.4 9.2  NEUTROABS 7.9*  --   --   --   --   --   --   HGB 9.6*   < > 10.4* 9.6* 8.7* 8.9* 9.8*  HCT 28.7*   < > 30.4* 27.3* 26.0* 26.4* 29.0*  MCV 92.9  --   --  87.8 92.2 92.0 91.5  PLT 250  --   --  280 271 275 315   < > = values in this interval not displayed.   Cardiac Enzymes: Recent Labs  Lab 08/02/19 1437  CKTOTAL 90   BNP: Invalid input(s): POCBNP CBG: Recent Labs  Lab 08/05/19 1115 08/05/19 1626 08/05/19 2113 08/06/19 0648 08/06/19 1109  GLUCAP 145* 112* 136* 117* 139*   D-Dimer No results for input(s): DDIMER in the last 72 hours. Hgb A1c No results for input(s): HGBA1C in the last 72 hours. Lipid Profile No results for input(s): CHOL, HDL, LDLCALC, TRIG, CHOLHDL, LDLDIRECT in the last 72 hours. Thyroid function studies No results for input(s): TSH, T4TOTAL, T3FREE, THYROIDAB in the last 72 hours.  Invalid input(s): FREET3 Anemia work up No results for input(s): VITAMINB12, FOLATE, FERRITIN, TIBC, IRON, RETICCTPCT in the last 72 hours. Microbiology Recent Results (from the past 240 hour(s))  Respiratory Panel by RT PCR (Flu A&B, Covid) - Nasopharyngeal Swab     Status: None   Collection Time: 08/02/19  4:49 AM   Specimen: Nasopharyngeal Swab  Result Value Ref Range Status   SARS Coronavirus 2 by RT PCR NEGATIVE NEGATIVE Final    Comment: (NOTE) SARS-CoV-2 target nucleic acids are  NOT DETECTED. The SARS-CoV-2 RNA is generally detectable in  upper respiratoy specimens during the acute phase of infection. The lowest concentration of SARS-CoV-2 viral copies this assay can detect is 131 copies/mL. A negative result does not preclude SARS-Cov-2 infection and should not be used as the sole basis for treatment or other patient management decisions. A negative result may occur with  improper specimen collection/handling, submission of specimen other than nasopharyngeal swab, presence of viral mutation(s) within the areas targeted by this assay, and inadequate number of viral copies (<131 copies/mL). A negative result must be combined with clinical observations, patient history, and epidemiological information. The expected result is Negative. Fact Sheet for Patients:  PinkCheek.be Fact Sheet for Healthcare Providers:  GravelBags.it This test is not yet ap proved or cleared by the [place] FDA and  has been authorized for detection and/or diagnosis of SARS-CoV-2 by FDA under an Emergency Use Authorization (EUA). This EUA will remain  in effect (meaning this test can be used) for the duration of the COVID-19 declaration under Section 564(b)(1) of the Act, 21 U.S.C. section 360bbb-3(b)(1), unless the authorization is terminated or revoked sooner.    Influenza A by PCR NEGATIVE NEGATIVE Final   Influenza B by PCR NEGATIVE NEGATIVE Final    Comment: (NOTE) The Xpert Xpress SARS-CoV-2/FLU/RSV assay is intended as an aid in  the diagnosis of influenza from Nasopharyngeal swab specimens and  should not be used as a sole basis for treatment. Nasal washings and  aspirates are unacceptable for Xpert Xpress SARS-CoV-2/FLU/RSV  testing. Fact Sheet for Patients: PinkCheek.be Fact Sheet for Healthcare Providers: GravelBags.it This test is not yet approved or cleared by the [place] FDA and  has been authorized for  detection and/or diagnosis of SARS-CoV-2 by  FDA under an Emergency Use Authorization (EUA). This EUA will remain  in effect (meaning this test can be used) for the duration of the  Covid-19 declaration under Section 564(b)(1) of the Act, 21  U.S.C. section 360bbb-3(b)(1), unless the authorization is  terminated or revoked. Performed at [facility], [address] [place], [place] [address]      Discharge Instructions:    Allergies as of 08/06/2019   No Known Allergies     Medication List    STOP taking these medications   aspirin EC 81 MG tablet     TAKE these medications   amLODipine 5 MG tablet Commonly known as: NORVASC TAKE ONE TABLET BY MOUTH ONCE DAILY.   atorvastatin 80 MG tablet Commonly known as: LIPITOR TAKE 1 TABLET BY MOUTH ONCE DAILY.   calcium carbonate 1500 (600 Ca) MG Tabs tablet Commonly known as: OSCAL Take 1,500 mg by mouth daily with breakfast.   cholecalciferol 25 MCG (1000 UNIT) tablet Commonly known as: VITAMIN D Take 1,000 Units by mouth daily.   donepezil 10 MG tablet Commonly known as: Aricept Take 1 tablet (10 mg total) by mouth at bedtime.   methimazole 5 MG tablet Commonly known as: TAPAZOLE TAKE ONE TABLET BY MOUTH DAILY.   metoprolol succinate 25 MG 24 hr tablet Commonly known as: Toprol XL Take 1 tablet (25 mg total) by mouth daily.   nitroGLYCERIN 0.4 MG SL tablet Commonly known as: NITROSTAT Place 1 tablet (0.4 mg total) under the tongue every 5 (five) minutes x 3 doses as needed for chest pain.            Durable Medical Equipment  (From admission, onward)         Start  Ordered   08/06/19 0713  For home use only DME Walker rolling  Once    Question Answer Comment  Walker: With [place]   Patient needs a walker to treat with the following condition Unstable gait      08/06/19 0713         Follow-up Information    Care, [facility] Follow up.   Specialty: [place] Why: The  office will call to schedule home health visits in about 2 weeks!! Contact information: [place] [address] [phone]        Aletta Edouard, MD Follow up.   Specialties: Interventional Radiology, Radiology Why: IR scheduler will call you with appointment date/time (approximately 3-4 weeks after discharge). Please call with questions or concerns. Contact information: [address] [place] [address] [phone]            Time coordinating discharge: 40 minutes  Signed:  Radene Gunning NP  [facility] 08/06/2019, 11:32 AM

## 2019-08-10 ENCOUNTER — Telehealth: Payer: Self-pay | Admitting: Family Medicine

## 2019-08-10 ENCOUNTER — Other Ambulatory Visit: Payer: Self-pay

## 2019-08-10 ENCOUNTER — Encounter (HOSPITAL_COMMUNITY): Payer: Self-pay | Admitting: Emergency Medicine

## 2019-08-10 ENCOUNTER — Emergency Department (HOSPITAL_COMMUNITY): Payer: Medicare HMO

## 2019-08-10 ENCOUNTER — Emergency Department (HOSPITAL_COMMUNITY)
Admission: EM | Admit: 2019-08-10 | Discharge: 2019-08-10 | Disposition: A | Discharge status: Home / Self Care | Payer: Medicare HMO | Attending: Emergency Medicine | Admitting: Emergency Medicine

## 2019-08-10 DIAGNOSIS — N183 Chronic kidney disease, stage 3 unspecified: Secondary | ICD-10-CM | POA: Diagnosis not present

## 2019-08-10 DIAGNOSIS — I2510 Atherosclerotic heart disease of native coronary artery without angina pectoris: Secondary | ICD-10-CM | POA: Diagnosis not present

## 2019-08-10 DIAGNOSIS — I129 Hypertensive chronic kidney disease with stage 1 through stage 4 chronic kidney disease, or unspecified chronic kidney disease: Secondary | ICD-10-CM | POA: Diagnosis not present

## 2019-08-10 DIAGNOSIS — Z79899 Other long term (current) drug therapy: Secondary | ICD-10-CM | POA: Diagnosis not present

## 2019-08-10 DIAGNOSIS — M7989 Other specified soft tissue disorders: Secondary | ICD-10-CM | POA: Insufficient documentation

## 2019-08-10 DIAGNOSIS — E1122 Type 2 diabetes mellitus with diabetic chronic kidney disease: Secondary | ICD-10-CM | POA: Insufficient documentation

## 2019-08-10 DIAGNOSIS — F0390 Unspecified dementia without behavioral disturbance: Secondary | ICD-10-CM | POA: Diagnosis not present

## 2019-08-10 DIAGNOSIS — K644 Residual hemorrhoidal skin tags: Secondary | ICD-10-CM | POA: Diagnosis not present

## 2019-08-10 DIAGNOSIS — Z951 Presence of aortocoronary bypass graft: Secondary | ICD-10-CM | POA: Diagnosis not present

## 2019-08-10 DIAGNOSIS — R531 Weakness: Secondary | ICD-10-CM

## 2019-08-10 DIAGNOSIS — M79605 Pain in left leg: Secondary | ICD-10-CM

## 2019-08-10 DIAGNOSIS — K6289 Other specified diseases of anus and rectum: Secondary | ICD-10-CM | POA: Diagnosis present

## 2019-08-10 DIAGNOSIS — Z9181 History of falling: Secondary | ICD-10-CM

## 2019-08-10 LAB — CBC WITH DIFFERENTIAL/PLATELET
Abs Immature Granulocytes: 0.03 10*3/uL (ref 0.00–0.07)
Basophils Absolute: 0 10*3/uL (ref 0.0–0.1)
Basophils Relative: 1 %
Eosinophils Absolute: 0.2 10*3/uL (ref 0.0–0.5)
Eosinophils Relative: 2 %
HCT: 26 % — ABNORMAL LOW (ref 36.0–46.0)
Hemoglobin: 8.8 g/dL — ABNORMAL LOW (ref 12.0–15.0)
Immature Granulocytes: 0 %
Lymphocytes Relative: 15 %
Lymphs Abs: 1.3 10*3/uL (ref 0.7–4.0)
MCH: 31.2 pg (ref 26.0–34.0)
MCHC: 33.8 g/dL (ref 30.0–36.0)
MCV: 92.2 fL (ref 80.0–100.0)
Monocytes Absolute: 0.7 10*3/uL (ref 0.1–1.0)
Monocytes Relative: 9 %
Neutro Abs: 6.2 10*3/uL (ref 1.7–7.7)
Neutrophils Relative %: 73 %
Platelets: 302 10*3/uL (ref 150–400)
RBC: 2.82 MIL/uL — ABNORMAL LOW (ref 3.87–5.11)
RDW: 12.1 % (ref 11.5–15.5)
WBC: 8.4 10*3/uL (ref 4.0–10.5)
nRBC: 0 % (ref 0.0–0.2)

## 2019-08-10 LAB — COMPREHENSIVE METABOLIC PANEL
ALT: 19 U/L (ref 0–44)
AST: 14 U/L — ABNORMAL LOW (ref 15–41)
Albumin: 3 g/dL — ABNORMAL LOW (ref 3.5–5.0)
Alkaline Phosphatase: 76 U/L (ref 38–126)
Anion gap: 9 (ref 5–15)
BUN: 37 mg/dL — ABNORMAL HIGH (ref 8–23)
CO2: 23 mmol/L (ref 22–32)
Calcium: 8.9 mg/dL (ref 8.9–10.3)
Chloride: 107 mmol/L (ref 98–111)
Creatinine, Ser: 2.55 mg/dL — ABNORMAL HIGH (ref 0.44–1.00)
GFR calc Af Amer: 21 mL/min — ABNORMAL LOW (ref >60)
GFR calc non Af Amer: 18 mL/min — ABNORMAL LOW (ref >60)
Glucose, Bld: 109 mg/dL — ABNORMAL HIGH (ref 70–99)
Potassium: 3.7 mmol/L (ref 3.5–5.1)
Sodium: 139 mmol/L (ref 135–145)
Total Bilirubin: 0.7 mg/dL (ref 0.3–1.2)
Total Protein: 6.2 g/dL — ABNORMAL LOW (ref 6.5–8.1)

## 2019-08-10 LAB — URINALYSIS, ROUTINE W REFLEX MICROSCOPIC
Bacteria, UA: NONE SEEN
Bilirubin Urine: NEGATIVE
Glucose, UA: NEGATIVE mg/dL
Ketones, ur: NEGATIVE mg/dL
Leukocytes,Ua: NEGATIVE
Nitrite: NEGATIVE
Protein, ur: 100 mg/dL — AB
Specific Gravity, Urine: 1.011 (ref 1.005–1.030)
pH: 5 (ref 5.0–8.0)

## 2019-08-10 IMAGING — US US EXTREM LOW VENOUS
1 series · 13 of 24 positions shown · non-contrast
Comparison: None.

CLINICAL DATA: Pain in bilateral lower extremities for 1 week



[Series 1: us venous img lower bilat (dvt) · portal-venous · 13 of 64 slices shown]
[im 1/64]
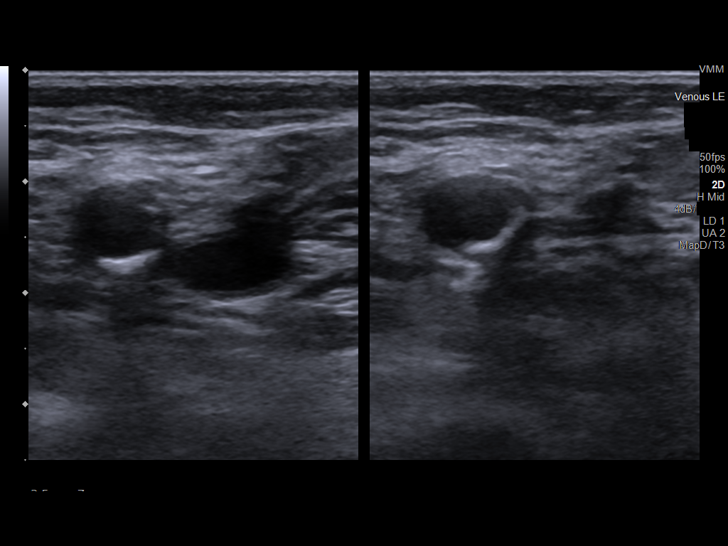
[im 6/64]
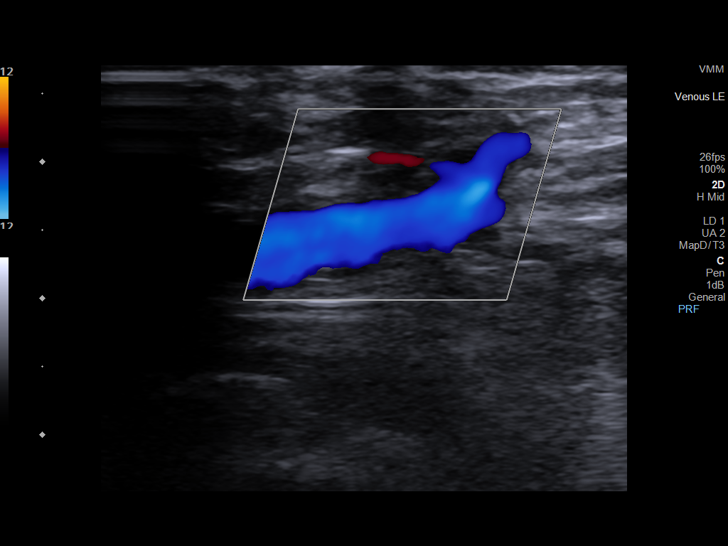
[im 11/64]
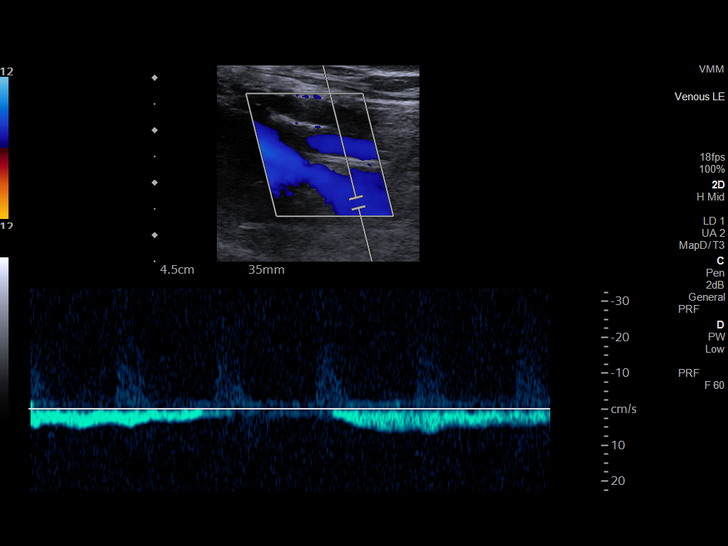
[im 17/64]
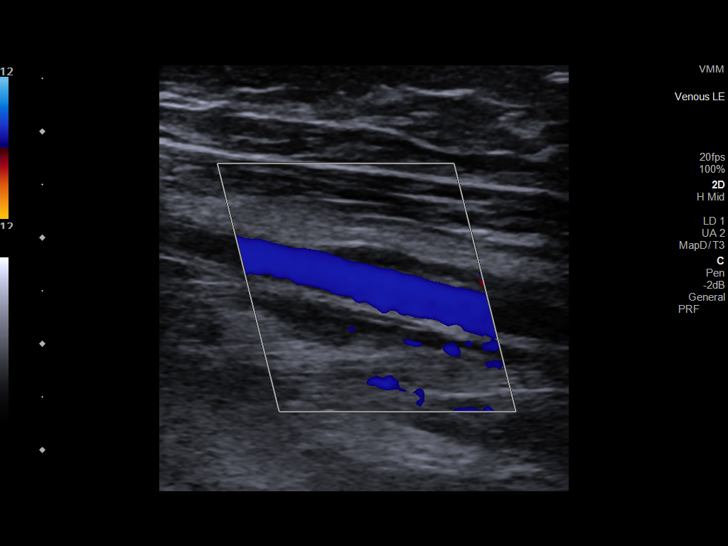
[im 22/64]
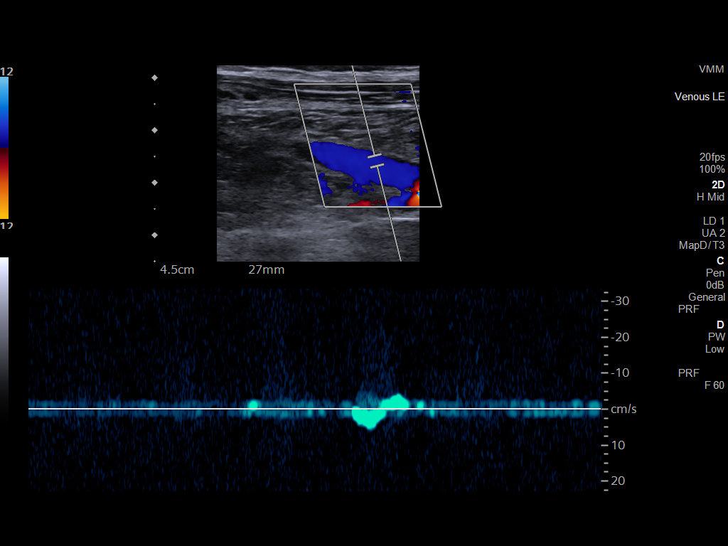
[im 28/64]
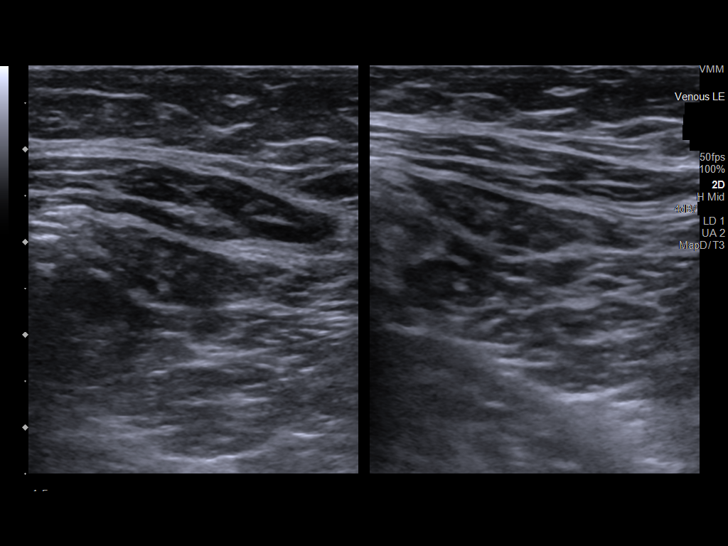
[im 33/64]
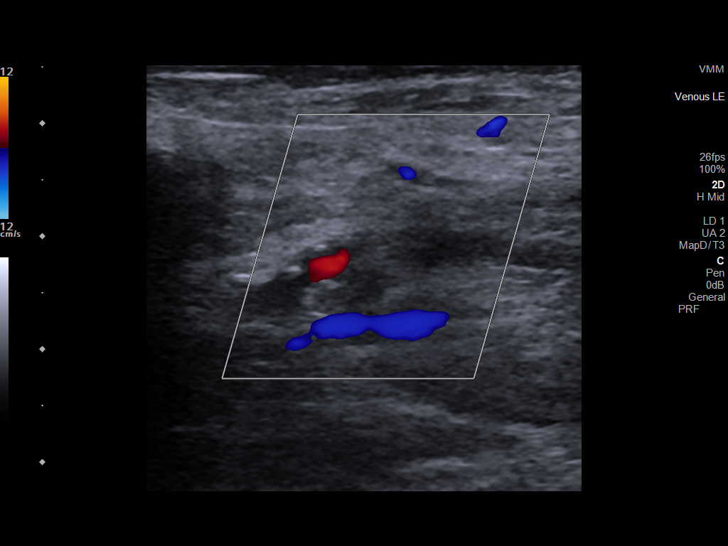
[im 36/64]
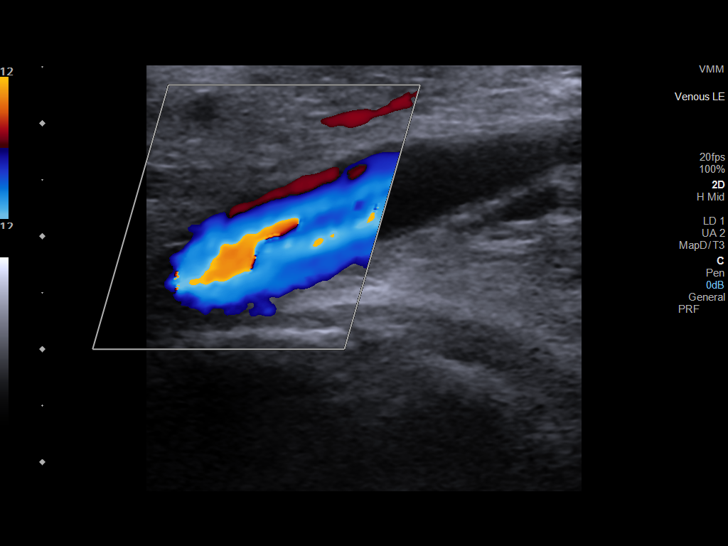
[im 42/64]
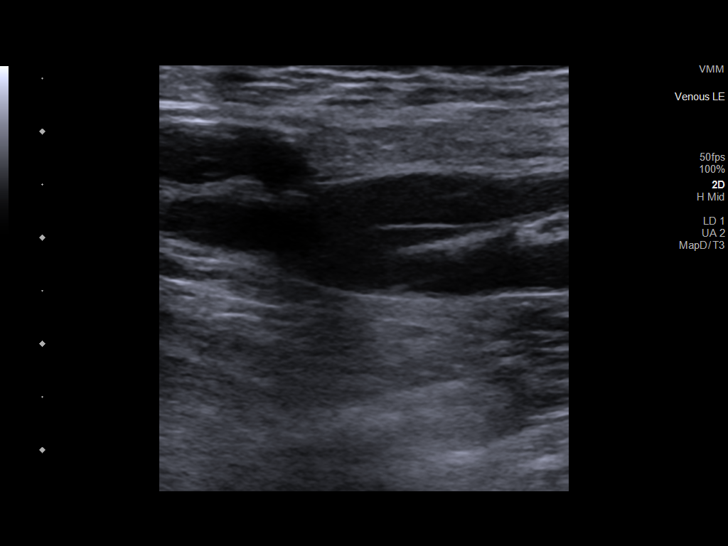
[im 47/64]
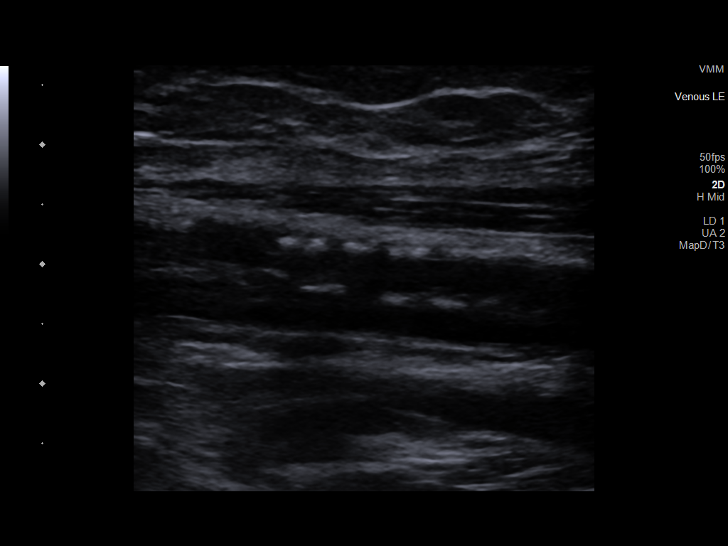
[im 53/64]
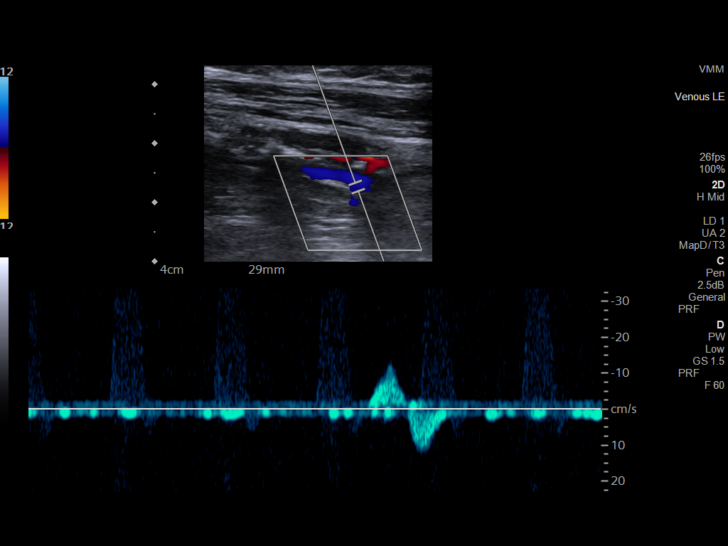
[im 58/64]
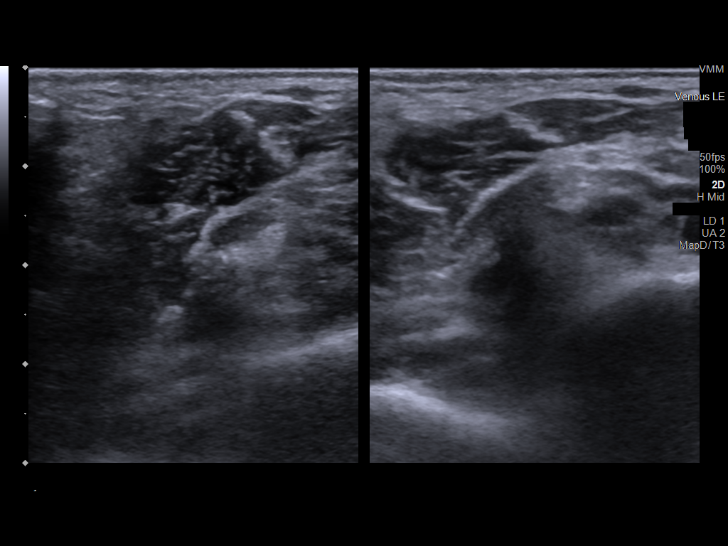
[im 64/64]
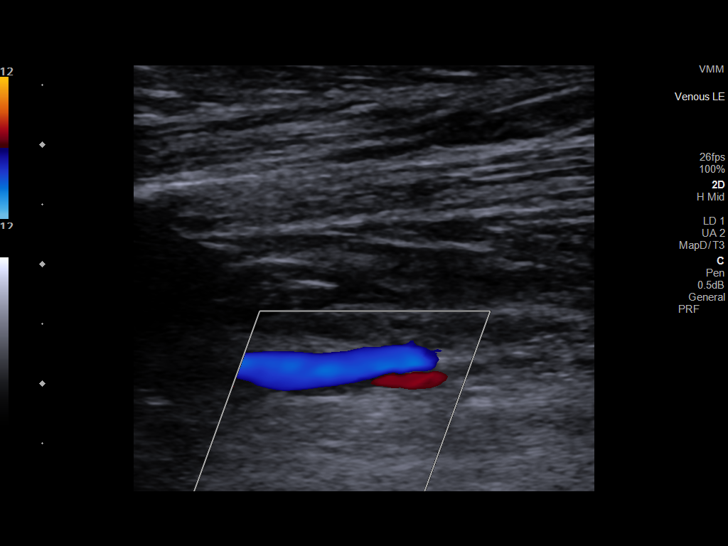

[13 of 24 positions shown; findings below may reference images not displayed]

FINDINGS: RIGHT LOWER EXTREMITY

Common Femoral Vein: No evidence of thrombus. Normal
compressibility, respiratory phasicity and response to augmentation.

Saphenofemoral Junction: No evidence of thrombus. Normal
compressibility and flow on color Doppler imaging.

Profunda Femoral Vein: No evidence of thrombus. Normal
compressibility and flow on color Doppler imaging.

Femoral Vein: No evidence of thrombus. Normal compressibility,
respiratory phasicity and response to augmentation.

Popliteal Vein: No evidence of thrombus. Normal compressibility,
respiratory phasicity and response to augmentation.

Calf Veins: No evidence of thrombus. Normal compressibility and flow
on color Doppler imaging.

Superficial Great Saphenous Vein: No evidence of thrombus. Normal
compressibility.

Venous Reflux:  None.

Other Findings:  None.

LEFT LOWER EXTREMITY

Common Femoral Vein: No evidence of thrombus. Normal
compressibility, respiratory phasicity and response to augmentation.

Saphenofemoral Junction: No evidence of thrombus. Normal
compressibility and flow on color Doppler imaging.

Profunda Femoral Vein: No evidence of thrombus. Normal
compressibility and flow on color Doppler imaging.

Femoral Vein: No evidence of thrombus. Normal compressibility,
respiratory phasicity and response to augmentation.

Popliteal Vein: No evidence of thrombus. Normal compressibility,
respiratory phasicity and response to augmentation.

Calf Veins: No evidence of thrombus. Normal compressibility and flow
on color Doppler imaging.

Superficial Great Saphenous Vein: No evidence of thrombus. Normal
compressibility.

Venous Reflux:  None.

Other Findings:  None.
IMPRESSION: No evidence of deep venous thrombosis in either lower extremity.

## 2019-08-10 MED ORDER — HYDROCORTISONE ACETATE 25 MG RE SUPP
25.0000 mg | Freq: Every day | RECTAL | 0 refills | Status: DC | PRN
Start: 2019-08-10 — End: 2019-10-15

## 2019-08-10 NOTE — Discharge Instructions (Addendum)
You have been diagnosed today with external hemorrhoid.  At this time there does not appear to be the presence of an emergent medical condition, however there is always the potential for conditions to change. Please read and follow the below instructions.  Please return to the Emergency Department immediately for any new or worsening symptoms. Please schedule your primary care doctor appointment on Thursday as scheduled for follow-up. You may use the Anusol cream as prescribed to help with hemorrhoids.  Drink enough water to avoid constipation and dehydration.  Get enough rest.  Get help right away if you have: You have bleeding from your anus You have fever or chills You have abdominal pain, nausea or vomiting You have any new/concerning or worsening of symptoms  Please read the additional information packets attached to your discharge summary.  Do not take your medicine if  develop an itchy rash, swelling in your mouth or lips, or difficulty breathing; call 911 and seek immediate emergency medical attention if this occurs.  Note: Portions of this text may have been transcribed using voice recognition software. Every effort was made to ensure accuracy; however, inadvertent computerized transcription errors may still be present.

## 2019-08-10 NOTE — Telephone Encounter (Signed)
I do not believe [facility] does PT, patient will need home health PT which requires a video visit or in office visit

## 2019-08-10 NOTE — Telephone Encounter (Signed)
VM received from Amy PT w/ [facility] for VO for PT and an aide. VO given

## 2019-08-10 NOTE — Telephone Encounter (Signed)
Erin Jordan, can you help?  Does [facility] provide home PT?  Do we need to schedule video or in person visit with patient?  How do we  Help?

## 2019-08-10 NOTE — Telephone Encounter (Signed)
Appointment scheduled for patient to come in for hospital follow up with Hendricks Limes so they can discuss what she needs.

## 2019-08-10 NOTE — Telephone Encounter (Signed)
Granddaughter called to get hospital follow up scheduled for patient.  Patient scheduled for a tele visit with Blanch Media on 5/12 since patient is very weak.  Seen at [facility] on 4/26 for acute renal failure. Requesting a rx for [facility] to [facility]  [phone] Also, requesting a referral placed for patient to be in rolled in [facility] for in home PT.    Aware you are working from home today and the shower chair will not get completed until you return in office tomorrow. Verbalized understanding.  Please advise

## 2019-08-10 NOTE — ED Provider Notes (Addendum)
[facility] EMERGENCY DEPARTMENT Provider Note   CSN: [id number] Arrival date & time: 08/10/19  1343     History Chief Complaint  Patient presents with  . Rectal Pain    Erin Jordan is a 74 y.o. female history kidney cancer, CKD, CAD, hypertension, diabetes, hyperlipidemia.  Patient brought in by EMS today for concern of a hemorrhoid seen by patient's daughter.  Patient's daughter who does not regularly bathe or take care of patient was given the patient a bath today, upon finishing the bath she noticed a small piece of skin near the anus, was concern for hemorrhoid and brought patient to the ER.  Patient denies any associated pain of this area.  She was unaware of this problem before the daughter noticed it.  Second concern brought by daughter is of bilateral feet swelling, reports is a chronic problem for the patient and has not changed recently however patient does have a history of blood clot and is not on blood thinners, family is concerned for blood clot.  Family has no other acute concerns today, they do report patient has generalized weakness but this is baseline and without acute change.  No history of fever/chills, headache, chest pain/shortness of breath, abdominal pain, nausea/vomiting, diarrhea, melena, hematochezia or additional concerns.  HPI     Past Medical History:  Diagnosis Date  . Arthritis   . Cancer of kidney ([place])   . Chronic kidney disease, stage 3, mod decreased GFR   . Coronary atherosclerosis of native coronary artery 2006   Multivessel status post CABG in [place], graft disease documented March 2019 with DES to SVG to diagonal  . Essential hypertension   . 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 History of diabetes mellitus, type II   . Hyperlipidemia   . NSTEMI (non-ST elevated myocardial infarction) ([place]) 06/24/2017  . Renal hematoma   . Right renal mass     Patient Active Problem List   Diagnosis Date Noted  . ARF (acute renal  failure) ([place]) 08/02/2019  . Renal cell carcinoma ([place]) 08/02/2019  . Postoperative surgical complication involving genitourinary system associated with genitourinary procedure 08/02/2019  . Acute blood loss anemia 08/02/2019  . Renal hematoma 08/02/2019  . RLS (restless legs syndrome) 01/25/2019  . Right renal mass 01/25/2019  . Splenic mass 01/25/2019  . Blood in stool 01/25/2019  . Dementia without behavioral disturbance ([place]) 01/25/2019  . Hyperthyroidism 09/14/2018  . Osteopenia 05/20/2018  . Multinodular goiter 10/14/2017  . Weight loss, unintentional 07/14/2017  . CAD -S/P PCI 06/26/17 06/27/2017  . 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 Hx of CABG- 2006   . History of diabetes mellitus   . Essential hypertension   . Stage 3 chronic kidney disease   . Hypokalemia 06/02/2011  . Dyslipidemia, goal LDL below 70 06/02/2011    Past Surgical History:  Procedure Laterality Date  . ABDOMINAL HYSTERECTOMY    . CORONARY ARTERY BYPASS GRAFT  2006   [place], [place]  . CORONARY STENT INTERVENTION N/A 06/25/2017   Procedure: CORONARY STENT INTERVENTION;  Surgeon: Jettie Booze, MD;  Location: [facility];  Service: Cardiovascular;  Laterality: N/A;  . IR RADIOLOGIST EVAL & MGMT  07/08/2019  . LEFT HEART CATH AND CORS/GRAFTS ANGIOGRAPHY N/A 06/25/2017   Procedure: LEFT HEART CATH AND CORS/GRAFTS ANGIOGRAPHY;  Surgeon: Jettie Booze, MD;  Location: [facility];  Service: Cardiovascular;  Laterality: N/A;     OB History    Gravida  Para      Term      Preterm      AB      Living  6     SAB      TAB      Ectopic      Multiple      Live Births              Family History  Problem Relation Age of Onset  . Heart attack Mother   . Hypertension Mother   . Seizures Son   . Seizures Son   . Seizures Daughter     Social History   Tobacco Use  . Smoking status: Never Smoker  . Smokeless tobacco: Never Used  Substance Use  Topics  . Alcohol use: No  . Drug use: No    Home Medications Prior to Admission medications   Medication Sig Start Date End Date Taking? Authorizing Provider  amLODipine (NORVASC) 5 MG tablet TAKE ONE TABLET BY MOUTH ONCE DAILY. Patient taking differently: Take 5 mg by mouth daily.  08/02/19  Yes Hendricks Limes F, FNP  atorvastatin (LIPITOR) 80 MG tablet TAKE 1 TABLET BY MOUTH ONCE DAILY. Patient taking differently: Take 80 mg by mouth daily.  06/28/19  Yes Hendricks Limes F, FNP  calcium carbonate (OSCAL) 1500 (600 Ca) MG TABS tablet Take 1,500 mg by mouth daily with breakfast.    Yes [provider]  cholecalciferol (VITAMIN D) 25 MCG (1000 UT) tablet Take 1,000 Units by mouth daily.    Yes [provider]  donepezil (ARICEPT) 10 MG tablet Take 1 tablet (10 mg total) by mouth at bedtime. 03/24/19  Yes Hendricks Limes F, FNP  methimazole (TAPAZOLE) 5 MG tablet TAKE ONE TABLET BY MOUTH DAILY. Patient taking differently: Take 5 mg by mouth daily.  08/02/19  Yes Hendricks Limes F, FNP  metoprolol succinate (TOPROL XL) 25 MG 24 hr tablet Take 1 tablet (25 mg total) by mouth daily. 06/24/19  Yes Strader, [place] M, PA-C  nitroGLYCERIN (NITROSTAT) 0.4 MG SL tablet Place 1 tablet (0.4 mg total) under the tongue every 5 (five) minutes x 3 doses as needed for chest pain. 07/07/19  Yes Satira Sark, MD  hydrocortisone (ANUSOL-HC) 25 MG suppository Place 1 suppository (25 mg total) rectally daily as needed for hemorrhoids or anal itching. 08/10/19   Deliah Boston, PA-C    Allergies    Patient has no known allergies.  Review of Systems   Review of Systems Ten systems are reviewed and are negative for acute change except as noted in the HPI  Physical Exam Updated Vital Signs BP (!) 147/80   Pulse 74   Temp 99.6 F (37.6 C) (Oral)   Resp 14   Ht 5' 2.5" (1.588 m)   Wt 52.4 kg   SpO2 100%   BMI 20.79 kg/m   Physical Exam Constitutional:      General: She is not in  acute distress.    Appearance: Normal appearance. She is well-developed. She is not ill-appearing (Chronically frail.) or diaphoretic.  HENT:     Head: Normocephalic and atraumatic.     Right Ear: External ear normal.     Left Ear: External ear normal.     Nose: Nose normal.  Eyes:     General: Vision grossly intact. Gaze aligned appropriately.     Pupils: Pupils are equal, round, and reactive to light.  Neck:     Trachea: Trachea and phonation normal. No tracheal  deviation.  Pulmonary:     Effort: Pulmonary effort is normal. No respiratory distress.  Abdominal:     General: There is no distension.     Palpations: Abdomen is soft.     Tenderness: There is no abdominal tenderness. There is no guarding or rebound.  Genitourinary:    Comments: Examination chaperoned by Finis Bud.  Nonthrombosed hemorrhoid at the 6:00 border of the anus, nontender.  Rectal tone normal.  Soft light brown stool.  No palpable internal hemorrhoids or fissures. Musculoskeletal:        General: No tenderness. Normal range of motion.     Cervical back: Normal range of motion.     Right lower leg: No edema.     Left lower leg: No edema.     Comments: Bilateral lower extremities appear symmetric.  No skin break.  Capillary refill sensation intact to bilateral feet, equal pedal pulses.  No tenderness to palpation.  Skin:    General: Skin is warm and dry.  Neurological:     Mental Status: She is alert.     GCS: GCS eye subscore is 4. GCS verbal subscore is 5. GCS motor subscore is 6.     Comments: Speech is clear and goal oriented, follows commands Major Cranial nerves without deficit, no facial droop Moves extremities without ataxia, coordination intact  Psychiatric:        Behavior: Behavior normal.     ED Results / Procedures / Treatments   Labs (all labs ordered are listed, but only abnormal results are displayed) Labs Reviewed  CBC WITH DIFFERENTIAL/PLATELET - Abnormal; Notable for the following  components:      Result Value   RBC 2.82 (*)    Hemoglobin 8.8 (*)    HCT 26.0 (*)    All other components within normal limits  COMPREHENSIVE METABOLIC PANEL - Abnormal; Notable for the following components:   Glucose, Bld 109 (*)    BUN 37 (*)    Creatinine, Ser 2.55 (*)    Total Protein 6.2 (*)    Albumin 3.0 (*)    AST 14 (*)    GFR calc non Af Amer 18 (*)    GFR calc Af Amer 21 (*)    All other components within normal limits  URINALYSIS, ROUTINE W REFLEX MICROSCOPIC - Abnormal; Notable for the following components:   APPearance HAZY (*)    Hgb urine dipstick MODERATE (*)    Protein, ur 100 (*)    All other components within normal limits    EKG None  Radiology US Venous Img Lower Bilateral (DVT)  Result Date: 08/10/2019 CLINICAL DATA:  Pain in bilateral lower extremities for 1 week EXAM: BILATERAL LOWER EXTREMITY VENOUS DOPPLER ULTRASOUND TECHNIQUE: Gray-scale sonography with graded compression, as well as color Doppler and duplex ultrasound were performed to evaluate the lower extremity deep venous systems from the level of the common femoral vein and including the common femoral, femoral, profunda femoral, popliteal and calf veins including the posterior tibial, peroneal and gastrocnemius veins when visible. The superficial great saphenous vein was also interrogated. Spectral Doppler was utilized to evaluate flow at rest and with distal augmentation maneuvers in the common femoral, femoral and popliteal veins. COMPARISON:  None. FINDINGS: RIGHT LOWER EXTREMITY Common Femoral Vein: No evidence of thrombus. Normal compressibility, respiratory phasicity and response to augmentation. Saphenofemoral Junction: No evidence of thrombus. Normal compressibility and flow on color Doppler imaging. Profunda Femoral Vein: No evidence of thrombus. Normal compressibility and flow on color  Doppler imaging. Femoral Vein: No evidence of thrombus. Normal compressibility, respiratory phasicity and  response to augmentation. Popliteal Vein: No evidence of thrombus. Normal compressibility, respiratory phasicity and response to augmentation. Calf Veins: No evidence of thrombus. Normal compressibility and flow on color Doppler imaging. Superficial Great Saphenous Vein: No evidence of thrombus. Normal compressibility. Venous Reflux:  None. Other Findings:  None. LEFT LOWER EXTREMITY Common Femoral Vein: No evidence of thrombus. Normal compressibility, respiratory phasicity and response to augmentation. Saphenofemoral Junction: No evidence of thrombus. Normal compressibility and flow on color Doppler imaging. Profunda Femoral Vein: No evidence of thrombus. Normal compressibility and flow on color Doppler imaging. Femoral Vein: No evidence of thrombus. Normal compressibility, respiratory phasicity and response to augmentation. Popliteal Vein: No evidence of thrombus. Normal compressibility, respiratory phasicity and response to augmentation. Calf Veins: No evidence of thrombus. Normal compressibility and flow on color Doppler imaging. Superficial Great Saphenous Vein: No evidence of thrombus. Normal compressibility. Venous Reflux:  None. Other Findings:  None. IMPRESSION: No evidence of deep venous thrombosis in either lower extremity. Electronically Signed   By: Randa Ngo M.D.   On: 08/10/2019 16:09    Procedures Procedures (including critical care time)  Medications Ordered in ED Medications - No data to display  ED Course  I have reviewed the triage vital signs and the nursing notes.  Pertinent labs & imaging results that were available during my care of the patient were reviewed by me and considered in my medical decision making (see chart for details).  Clinical Course as of Aug 09 1799  Tue Aug 10, 2019  1451 Willette Cluster RN   [BM]    Clinical Course User Index [BM] Gari Crown   MDM Rules/Calculators/A&P                     I have reviewed patient's chart.  Reviewed  discharge note from 08/06/2019.  Patient has been admitted for right flank pain and acute blood loss anemia from right perinephric hematoma after biopsy, did not require blood transfusion.  Hemoglobin was stable around 9.  Urology has plan for partial nephrectomy as outpatient.  Embolization if patient comes unstable or if hemoglobin drops.  She also had AKI last admission for creatinine improved to around 2. ------------ 74 year old patient today presents today following family concern of possible hemorrhoids seen while bathing patient today.  The daughter who was bathing patient does not regularly perform patient care and had not noticed this before.  On my evaluation patient denies any rectal pain and was unaware of hemorrhoid prior to today.  Examination shows nonthrombosed hemorrhoid, otherwise unremarkable exam.  Additionally family is concerned for possible DVTs of bilateral lower extremities given her history and questionable bilateral feet swelling.  On exam does not appear to be any swelling, she is neurovascularly intact, however due to patient and family concern will obtain bilateral lower extremity ultrasounds.  No evidence of cellulitis, septic arthritis, DVT, compartment syndrome, neurovascular compromise or other emergent pathologies.  Additionally will obtain CBC, CMP and urinalysis for recheck given patient's recent admission. - I have ordered, reviewed and interpreted the following labs.  CBC shows hemoglobin 8.8 appears similar to prior no acute drop this is reassuring.  CBC shows no leukocytosis to suggest infection.  CMP shows creatinine 2.55 and BUN of 37 which is improved from priors.  No emergent electrolyte derangement or acute elevation of LFTs. Urinalysis shows moderate hemoglobin 100 protein, consistent with prior no evidence of infection.  Bilateral Lower Extremity DVT study:    IMPRESSION:  No evidence of deep venous thrombosis in either lower extremity.  - Reassuring  work-up as above, will prescribe patient Anusol as needed and encouraged to follow-up with PCP.  On reassessment she is resting comfortably, sleeping easily arousable to voice.  Patient and daughter state understanding of work-up and have no further questions.  Patient has a follow-up with her primary care provider on Thursday I encouraged them to maintain this appointment.  At this time there does not appear to be any evidence of an acute emergency medical condition and the patient appears stable for discharge with appropriate outpatient follow up. Diagnosis was discussed with patient who verbalizes understanding of care plan and is agreeable to discharge. I have discussed return precautions with patient who verbalizes understanding. Patient encouraged to follow-up with their PCP. All questions answered.  Patient's case discussed with Dr. Karle Starch who agrees with plan to discharge with follow-up.   Note: Portions of this report may have been transcribed using voice recognition software. Every effort was made to ensure accuracy; however, inadvertent computerized transcription errors may still be present. Final Clinical Impression(s) / ED Diagnoses Final diagnoses:  External hemorrhoid    Rx / DC Orders ED Discharge Orders         Ordered    hydrocortisone (ANUSOL-HC) 25 MG suppository  Daily PRN     08/10/19 1756           Deliah Boston, PA-C 08/10/19 1801    Deliah Boston, PA-C 08/10/19 1802    Truddie Hidden, MD 08/10/19 2253

## 2019-08-10 NOTE — Telephone Encounter (Signed)
I can write the prescription for the shower chair tomorrow, but [facility] doesn't provide home PT. She would need home health in order to have PT. If that is needed, I'm pretty sure they have to have a face-to-face visit for me to order it - can be video or in-person.

## 2019-08-10 NOTE — ED Triage Notes (Signed)
PT brought in by [facility] today from home due to a non-bleeding hemorrhoid noted in the bathtub by her daughter today and bilateral feet swelling. PT states she has had generalized weakness and got discharged from [facility] this past week and has kidney cancer and awaiting kidney removal surgery.

## 2019-08-11 NOTE — Addendum Note (Signed)
Addended by: Hendricks Limes F on: 08/11/2019 11:53 AM   Modules accepted: Orders

## 2019-08-11 NOTE — Telephone Encounter (Signed)
Shower chair ordered and signed. In my out-basket for faxing.

## 2019-08-11 NOTE — Telephone Encounter (Signed)
FYI

## 2019-08-16 NOTE — Progress Notes (Signed)
Assessment & Plan:  1. Acute blood loss anemia - CBC with Differential/Platelet  2. Stage 3b chronic kidney disease - CMP14+EGFR  3. Do not intubate, cardiopulmonary resuscitation (CPR)-only code status - DNR (Do Not Resuscitate)  4. Essential hypertension - Well controlled on current regimen.  - CMP14+EGFR - amLODipine (NORVASC) 5 MG tablet; Take 1 tablet (5 mg total) by mouth daily.  Dispense: 90 tablet; Refill: 1   Follow up plan: Return in about 6 weeks (around 09/30/2019) for follow-up of chronic medication conditions.  Hendricks Limes, MSN, APRN, FNP-C [facility]  Subjective:   Patient ID: Erin Jordan, female    DOB: 01/19/1946, 74 y.o.   MRN: [id number]  HPI: Erin Jordan is a 74 y.o. female presenting on 08/19/2019 for Hospitalization Follow-up (4/26- [facility]-acute kindney failure & 5/4 AP- hemmorroid)  Patient is accompanied by her granddaughter whom she is okay with being present.  Patient was hospitalized at [facility] from 08/02/2019 - 08/06/2019 due to a right perinephric hematoma. Her hemoglobin did drop due to acute blood loss, but she did not require a blood transfusion. She was seen by nephrology while in the hospital due to significantly elevated creatinine and acute kidney injury; she was not a good candidate for dialysis but her renal function did improve. It was recommended she have CBC and BMP completed on follow-up.   Patient's granddaughter reports they were told her kidney does need to be removed but they want her to be stronger and more well before they do this.  She does report that hospice has been mentioned but they have not made that decision.  For now they want to proceed with the surgery.  Patient does have follow-up appointments already scheduled with urology, nephrology, and oncology.   ROS: Negative unless specifically indicated above in HPI.   Relevant past medical history reviewed and updated as indicated.   Allergies  and medications reviewed and updated.   Current Outpatient Medications:  .  amLODipine (NORVASC) 5 MG tablet, Take 1 tablet (5 mg total) by mouth daily., Disp: 90 tablet, Rfl: 1 .  atorvastatin (LIPITOR) 80 MG tablet, Take 1 tablet (80 mg total) by mouth daily., Disp: 90 tablet, Rfl: 1 .  calcium carbonate (OSCAL) 1500 (600 Ca) MG TABS tablet, Take 1,500 mg by mouth daily with breakfast. , Disp: , Rfl:  .  cholecalciferol (VITAMIN D) 25 MCG (1000 UT) tablet, Take 1,000 Units by mouth daily. , Disp: , Rfl:  .  donepezil (ARICEPT) 10 MG tablet, Take 1 tablet (10 mg total) by mouth at bedtime., Disp: 30 tablet, Rfl: 2 .  hydrocortisone (ANUSOL-HC) 25 MG suppository, Place 1 suppository (25 mg total) rectally daily as needed for hemorrhoids or anal itching., Disp: 6 suppository, Rfl: 0 .  methimazole (TAPAZOLE) 5 MG tablet, TAKE ONE TABLET BY MOUTH DAILY. (Patient taking differently: Take 5 mg by mouth daily. ), Disp: 30 tablet, Rfl: 2 .  metoprolol succinate (TOPROL XL) 25 MG 24 hr tablet, Take 1 tablet (25 mg total) by mouth daily., Disp: 90 tablet, Rfl: 3 .  nitroGLYCERIN (NITROSTAT) 0.4 MG SL tablet, Place 1 tablet (0.4 mg total) under the tongue every 5 (five) minutes x 3 doses as needed for chest pain., Disp: 25 tablet, Rfl: 3  No Known Allergies  Objective:   BP (!) 156/82   Pulse 89   Temp (!) 96.8 F (36 C) (Temporal)   Ht 5' 2.5" (1.588 m)   Wt 112 lb (50.8  kg)   SpO2 100%   BMI 20.16 kg/m    Physical Exam Vitals reviewed.  Constitutional:      General: She is not in acute distress.    Appearance: Normal appearance. She is not ill-appearing, toxic-appearing or diaphoretic.  HENT:     Head: Normocephalic and atraumatic.  Eyes:     General: No scleral icterus.       Right eye: No discharge.        Left eye: No discharge.     Conjunctiva/sclera: Conjunctivae normal.  Cardiovascular:     Rate and Rhythm: Normal rate and regular rhythm.     Heart sounds: Normal heart  sounds. No murmur. No friction rub. No gallop.   Pulmonary:     Effort: Pulmonary effort is normal. No respiratory distress.     Breath sounds: Normal breath sounds. No stridor. No wheezing, rhonchi or rales.  Musculoskeletal:        General: Normal range of motion.     Cervical back: Normal range of motion.  Skin:    General: Skin is warm and dry.     Capillary Refill: Capillary refill takes less than 2 seconds.  Neurological:     General: No focal deficit present.     Mental Status: She is alert and oriented to person, place, and time. Mental status is at baseline.     Motor: Weakness present.     Gait: Gait abnormal (ambutating with walker).  Psychiatric:        Mood and Affect: Mood normal.        Behavior: Behavior normal.        Thought Content: Thought content normal.        Judgment: Judgment normal.

## 2019-08-16 NOTE — Progress Notes (Deleted)
Virtual Visit via Telephone Note  I connected with [name] on 08/16/19 at *** by telephone and verified that I am speaking with the correct person using two identifiers. [name] is currently located at *** and *** is currently with her during this visit. The provider, [name], FNP is located in their office at time of visit.  I discussed the limitations, risks, security and privacy concerns of performing an evaluation and management service by telephone and the availability of in person appointments. I also discussed with the patient that there may be a patient responsible charge related to this service. The patient expressed understanding and agreed to proceed.  Subjective: PCP: [name], FNP  No chief complaint on file.     ROS: Per HPI  Current Outpatient Medications:  .  amLODipine (NORVASC) 5 MG tablet, TAKE ONE TABLET BY MOUTH ONCE DAILY. (Patient taking differently: Take 5 mg by mouth daily. ), Disp: 30 tablet, Rfl: 2 .  atorvastatin (LIPITOR) 80 MG tablet, TAKE 1 TABLET BY MOUTH ONCE DAILY. (Patient taking differently: Take 80 mg by mouth daily. ), Disp: 90 tablet, Rfl: 0 .  calcium carbonate (OSCAL) 1500 (600 Ca) MG TABS tablet, Take 1,500 mg by mouth daily with breakfast. , Disp: , Rfl:  .  cholecalciferol (VITAMIN D) 25 MCG (1000 UT) tablet, Take 1,000 Units by mouth daily. , Disp: , Rfl:  .  donepezil (ARICEPT) 10 MG tablet, Take 1 tablet (10 mg total) by mouth at bedtime., Disp: 30 tablet, Rfl: 2 .  hydrocortisone (ANUSOL-HC) 25 MG suppository, Place 1 suppository (25 mg total) rectally daily as needed for hemorrhoids or anal itching., Disp: 6 suppository, Rfl: 0 .  methimazole (TAPAZOLE) 5 MG tablet, TAKE ONE TABLET BY MOUTH DAILY. (Patient taking differently: Take 5 mg by mouth daily. ), Disp: 30 tablet, Rfl: 2 .  metoprolol succinate (TOPROL XL) 25 MG 24 hr tablet, Take 1 tablet (25 mg total) by mouth daily., Disp: 90 tablet, Rfl: 3 .   nitroGLYCERIN (NITROSTAT) 0.4 MG SL tablet, Place 1 tablet (0.4 mg total) under the tongue every 5 (five) minutes x 3 doses as needed for chest pain., Disp: 25 tablet, Rfl: 3  No Known Allergies Past Medical History:  Diagnosis Date  . Arthritis   . Cancer of kidney ([place])   . Chronic kidney disease, stage 3, mod decreased GFR   . Coronary atherosclerosis of native coronary artery 2006   Multivessel status post CABG in [place], graft disease documented March 2019 with DES to SVG to diagonal  . Essential hypertension   . 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 History of diabetes mellitus, type II   . Hyperlipidemia   . NSTEMI (non-ST elevated myocardial infarction) ([place]) 06/24/2017  . Renal hematoma   . Right renal mass     Observations/Objective: ***  Assessment and Plan: ***  Follow Up Instructions: *** I discussed the assessment and treatment plan with the patient. The patient was provided an opportunity to ask questions and all were answered. The patient agreed with the plan and demonstrated an understanding of the instructions.   The patient was advised to call back or seek an in-person evaluation if the symptoms worsen or if the condition fails to improve as anticipated.  The above assessment and management plan was discussed with the patient. The patient verbalized understanding of and has agreed to the management plan. Patient is aware to call the clinic if symptoms persist or  worsen. Patient is aware when to return to the clinic for a follow-up visit. Patient educated on when it is appropriate to go to the emergency department.   Time call ended: ***  I provided *** minutes of non-face-to-face time during this encounter.  Hendricks Limes, MSN, APRN, FNP-C [facility] 08/16/19

## 2019-08-18 ENCOUNTER — Other Ambulatory Visit: Payer: Self-pay

## 2019-08-18 ENCOUNTER — Ambulatory Visit (INDEPENDENT_AMBULATORY_CARE_PROVIDER_SITE_OTHER): Payer: Medicare HMO

## 2019-08-18 ENCOUNTER — Ambulatory Visit: Payer: Medicare HMO | Admitting: Family Medicine

## 2019-08-18 DIAGNOSIS — Z9181 History of falling: Secondary | ICD-10-CM

## 2019-08-18 DIAGNOSIS — D630 Anemia in neoplastic disease: Secondary | ICD-10-CM

## 2019-08-18 DIAGNOSIS — I252 Old myocardial infarction: Secondary | ICD-10-CM

## 2019-08-18 DIAGNOSIS — M8580 Other specified disorders of bone density and structure, unspecified site: Secondary | ICD-10-CM

## 2019-08-18 DIAGNOSIS — C641 Malignant neoplasm of right kidney, except renal pelvis: Secondary | ICD-10-CM | POA: Diagnosis not present

## 2019-08-18 DIAGNOSIS — K649 Unspecified hemorrhoids: Secondary | ICD-10-CM

## 2019-08-18 DIAGNOSIS — E785 Hyperlipidemia, unspecified: Secondary | ICD-10-CM

## 2019-08-18 DIAGNOSIS — F0390 Unspecified dementia without behavioral disturbance: Secondary | ICD-10-CM

## 2019-08-18 DIAGNOSIS — N9989 Other postprocedural complications and disorders of genitourinary system: Secondary | ICD-10-CM | POA: Diagnosis not present

## 2019-08-18 DIAGNOSIS — E039 Hypothyroidism, unspecified: Secondary | ICD-10-CM

## 2019-08-18 DIAGNOSIS — M1990 Unspecified osteoarthritis, unspecified site: Secondary | ICD-10-CM

## 2019-08-18 DIAGNOSIS — S37011D Minor contusion of right kidney, subsequent encounter: Secondary | ICD-10-CM

## 2019-08-18 DIAGNOSIS — I2510 Atherosclerotic heart disease of native coronary artery without angina pectoris: Secondary | ICD-10-CM

## 2019-08-18 DIAGNOSIS — G2581 Restless legs syndrome: Secondary | ICD-10-CM

## 2019-08-18 DIAGNOSIS — N183 Chronic kidney disease, stage 3 unspecified: Secondary | ICD-10-CM

## 2019-08-18 DIAGNOSIS — Z955 Presence of coronary angioplasty implant and graft: Secondary | ICD-10-CM

## 2019-08-18 DIAGNOSIS — D62 Acute posthemorrhagic anemia: Secondary | ICD-10-CM

## 2019-08-18 DIAGNOSIS — I129 Hypertensive chronic kidney disease with stage 1 through stage 4 chronic kidney disease, or unspecified chronic kidney disease: Secondary | ICD-10-CM

## 2019-08-18 DIAGNOSIS — E1122 Type 2 diabetes mellitus with diabetic chronic kidney disease: Secondary | ICD-10-CM

## 2019-08-18 DIAGNOSIS — Z951 Presence of aortocoronary bypass graft: Secondary | ICD-10-CM

## 2019-08-18 DIAGNOSIS — N179 Acute kidney failure, unspecified: Secondary | ICD-10-CM

## 2019-08-18 DIAGNOSIS — M47816 Spondylosis without myelopathy or radiculopathy, lumbar region: Secondary | ICD-10-CM

## 2019-08-18 DIAGNOSIS — E0520 Thyrotoxicosis with toxic multinodular goiter without thyrotoxic crisis or storm: Secondary | ICD-10-CM

## 2019-08-18 DIAGNOSIS — D631 Anemia in chronic kidney disease: Secondary | ICD-10-CM

## 2019-08-19 ENCOUNTER — Ambulatory Visit (INDEPENDENT_AMBULATORY_CARE_PROVIDER_SITE_OTHER): Payer: Medicare HMO | Admitting: Family Medicine

## 2019-08-19 ENCOUNTER — Other Ambulatory Visit: Payer: Self-pay

## 2019-08-19 ENCOUNTER — Encounter: Payer: Self-pay | Admitting: Family Medicine

## 2019-08-19 VITALS — BP 156/82 | HR 89 | Temp 96.8°F | Ht 62.5 in | Wt 112.0 lb

## 2019-08-19 DIAGNOSIS — Z789 Other specified health status: Secondary | ICD-10-CM

## 2019-08-19 DIAGNOSIS — D62 Acute posthemorrhagic anemia: Secondary | ICD-10-CM | POA: Diagnosis not present

## 2019-08-19 DIAGNOSIS — N1832 Chronic kidney disease, stage 3b: Secondary | ICD-10-CM | POA: Diagnosis not present

## 2019-08-19 DIAGNOSIS — I10 Essential (primary) hypertension: Secondary | ICD-10-CM | POA: Diagnosis not present

## 2019-08-19 DIAGNOSIS — E785 Hyperlipidemia, unspecified: Secondary | ICD-10-CM

## 2019-08-19 MED ORDER — AMLODIPINE BESYLATE 5 MG PO TABS: 5.0000 mg | ORAL_TABLET | Freq: Every day | ORAL | 1 refills | Status: DC

## 2019-08-19 MED ORDER — ATORVASTATIN CALCIUM 80 MG PO TABS: 80.0000 mg | ORAL_TABLET | Freq: Every day | ORAL | 1 refills | Status: DC

## 2019-08-20 LAB — CMP14+EGFR
ALT: 12 IU/L (ref 0–32)
AST: 13 IU/L (ref 0–40)
Albumin/Globulin Ratio: 1.4 (ref 1.2–2.2)
Albumin: 3.7 g/dL (ref 3.7–4.7)
Alkaline Phosphatase: 125 IU/L — ABNORMAL HIGH (ref 39–117)
BUN/Creatinine Ratio: 12 (ref 12–28)
BUN: 26 mg/dL (ref 8–27)
Bilirubin Total: 0.4 mg/dL (ref 0.0–1.2)
CO2: 19 mmol/L — ABNORMAL LOW (ref 20–29)
Calcium: 10 mg/dL (ref 8.7–10.3)
Chloride: 107 mmol/L — ABNORMAL HIGH (ref 96–106)
Creatinine, Ser: 2.16 mg/dL — ABNORMAL HIGH (ref 0.57–1.00)
GFR calc Af Amer: 25 mL/min/{1.73_m2} — ABNORMAL LOW (ref >59)
GFR calc non Af Amer: 22 mL/min/{1.73_m2} — ABNORMAL LOW (ref >59)
Globulin, Total: 2.6 g/dL (ref 1.5–4.5)
Glucose: 127 mg/dL — ABNORMAL HIGH (ref 65–99)
Potassium: 6 mmol/L (ref 3.5–5.2)
Sodium: 147 mmol/L — ABNORMAL HIGH (ref 134–144)
Total Protein: 6.3 g/dL (ref 6.0–8.5)

## 2019-08-20 LAB — CBC WITH DIFFERENTIAL/PLATELET
Basophils Absolute: 0.1 10*3/uL (ref 0.0–0.2)
Basos: 1 %
EOS (ABSOLUTE): 0.2 10*3/uL (ref 0.0–0.4)
Eos: 3 %
Hematocrit: 30.9 % — ABNORMAL LOW (ref 34.0–46.6)
Hemoglobin: 10.5 g/dL — ABNORMAL LOW (ref 11.1–15.9)
Immature Grans (Abs): 0 10*3/uL (ref 0.0–0.1)
Immature Granulocytes: 0 %
Lymphocytes Absolute: 1.4 10*3/uL (ref 0.7–3.1)
Lymphs: 21 %
MCH: 31 pg (ref 26.6–33.0)
MCHC: 34 g/dL (ref 31.5–35.7)
MCV: 91 fL (ref 79–97)
Monocytes Absolute: 0.6 10*3/uL (ref 0.1–0.9)
Monocytes: 8 %
Neutrophils Absolute: 4.8 10*3/uL (ref 1.4–7.0)
Neutrophils: 67 %
Platelets: 371 10*3/uL (ref 150–450)
RBC: 3.39 x10E6/uL — ABNORMAL LOW (ref 3.77–5.28)
RDW: 12.4 % (ref 11.7–15.4)
WBC: 7 10*3/uL (ref 3.4–10.8)

## 2019-08-22 ENCOUNTER — Encounter: Payer: Self-pay | Admitting: Family Medicine

## 2019-08-27 ENCOUNTER — Other Ambulatory Visit: Payer: Medicare HMO

## 2019-08-27 ENCOUNTER — Other Ambulatory Visit: Payer: Self-pay

## 2019-08-27 DIAGNOSIS — E875 Hyperkalemia: Secondary | ICD-10-CM

## 2019-08-27 NOTE — Addendum Note (Signed)
Addended by: Liliane Bade on: 08/27/2019 03:05 PM   Modules accepted: Orders

## 2019-08-28 LAB — BMP8+EGFR
BUN/Creatinine Ratio: 15 (ref 12–28)
BUN: 30 mg/dL — ABNORMAL HIGH (ref 8–27)
CO2: 24 mmol/L (ref 20–29)
Calcium: 9.5 mg/dL (ref 8.7–10.3)
Chloride: 104 mmol/L (ref 96–106)
Creatinine, Ser: 1.95 mg/dL — ABNORMAL HIGH (ref 0.57–1.00)
GFR calc Af Amer: 28 mL/min/{1.73_m2} — ABNORMAL LOW (ref >59)
GFR calc non Af Amer: 25 mL/min/{1.73_m2} — ABNORMAL LOW (ref >59)
Glucose: 130 mg/dL — ABNORMAL HIGH (ref 65–99)
Potassium: 3.9 mmol/L (ref 3.5–5.2)
Sodium: 142 mmol/L (ref 134–144)

## 2019-09-16 ENCOUNTER — Ambulatory Visit (HOSPITAL_COMMUNITY): Payer: Medicare HMO | Admitting: Hematology

## 2019-09-20 ENCOUNTER — Other Ambulatory Visit: Payer: Self-pay

## 2019-09-20 ENCOUNTER — Inpatient Hospital Stay (HOSPITAL_COMMUNITY): Payer: Medicare HMO | Attending: Hematology | Admitting: Hematology

## 2019-09-20 VITALS — BP 166/71 | HR 98 | Temp 97.7°F | Resp 18 | Wt 111.7 lb

## 2019-09-20 DIAGNOSIS — Z79899 Other long term (current) drug therapy: Secondary | ICD-10-CM | POA: Insufficient documentation

## 2019-09-20 DIAGNOSIS — I10 Essential (primary) hypertension: Secondary | ICD-10-CM | POA: Insufficient documentation

## 2019-09-20 DIAGNOSIS — N183 Chronic kidney disease, stage 3 unspecified: Secondary | ICD-10-CM | POA: Insufficient documentation

## 2019-09-20 DIAGNOSIS — Z90710 Acquired absence of both cervix and uterus: Secondary | ICD-10-CM | POA: Insufficient documentation

## 2019-09-20 DIAGNOSIS — Z8249 Family history of ischemic heart disease and other diseases of the circulatory system: Secondary | ICD-10-CM | POA: Diagnosis not present

## 2019-09-20 DIAGNOSIS — C641 Malignant neoplasm of right kidney, except renal pelvis: Secondary | ICD-10-CM | POA: Insufficient documentation

## 2019-09-20 DIAGNOSIS — D649 Anemia, unspecified: Secondary | ICD-10-CM | POA: Diagnosis not present

## 2019-09-20 DIAGNOSIS — E119 Type 2 diabetes mellitus without complications: Secondary | ICD-10-CM | POA: Insufficient documentation

## 2019-09-20 DIAGNOSIS — I252 Old myocardial infarction: Secondary | ICD-10-CM | POA: Insufficient documentation

## 2019-09-20 DIAGNOSIS — E785 Hyperlipidemia, unspecified: Secondary | ICD-10-CM | POA: Insufficient documentation

## 2019-09-20 DIAGNOSIS — N2889 Other specified disorders of kidney and ureter: Secondary | ICD-10-CM

## 2019-09-20 NOTE — Patient Instructions (Signed)
[place] at [facility] Discharge Instructions  You were seen today by Dr. Delton Coombes. He went over your recent results. Please continue your routine care with your primary care provider. Dr. Delton Coombes will see you back in 3 months for labs and follow up.   Thank you for choosing [place] at [facility] to provide your oncology and hematology care.  To afford each patient quality time with our provider, please arrive at least 15 minutes before your scheduled appointment time.   If you have a lab appointment with the [facility] please come in thru the Main Entrance and check in at the main information desk  You need to re-schedule your appointment should you arrive 10 or more minutes late.  We strive to give you quality time with our providers, and arriving late affects you and other patients whose appointments are after yours.  Also, if you no show three or more times for appointments you may be dismissed from the clinic at the providers discretion.     Again, thank you for choosing [facility].  Our hope is that these requests will decrease the amount of time that you wait before being seen by our physicians.       _____________________________________________________________  Should you have questions after your visit to [facility], please contact our office at [phone] between the hours of 8:00 a.m. and 4:30 p.m.  Voicemails left after 4:00 p.m. will not be returned until the following business day.  For prescription refill requests, have your pharmacy contact our office and allow 72 hours.    Cancer Center Support Programs:   > Cancer Support Group  2nd Tuesday of the month 1pm-2pm, Journey Room

## 2019-09-20 NOTE — Progress Notes (Signed)
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  PRIOR THERAPY: None  CURRENT THERAPY: TBD.  BRIEF ONCOLOGIC HISTORY:  Oncology History   No history exists.    CANCER STAGING: Cancer Staging No matching staging information was found for the patient.  INTERVAL HISTORY:  Ms. Erin Jordan, a 74 y.o. female, returns for routine follow-up of her right kidney mass. Erin Jordan was last contacted via phone on 06/08/2019.  Today she is accompanied by her daughter. She will see Dr. Alyson Ingles on 09/29/2019. She was in the [facility] on 08/10/2019 for bleeding hemorrhoids. She denies having any hematuria.   REVIEW OF SYSTEMS:  Review of Systems  Constitutional: Positive for appetite change (mildly decreased) and fatigue (mild).  Gastrointestinal: Negative for blood in stool.  Genitourinary: Negative for hematuria.   All other systems reviewed and are negative.   PAST MEDICAL/SURGICAL HISTORY:  Past Medical History:  Diagnosis Date  . Arthritis   . Cancer of kidney ([place])   . Chronic kidney disease, stage 3, mod decreased GFR   . Coronary atherosclerosis of native coronary artery 2006   Multivessel status post CABG in [place], graft disease documented March 2019 with DES to SVG to diagonal  . Essential hypertension   . 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 History of diabetes mellitus, type II   . Hyperlipidemia   . NSTEMI (non-ST elevated myocardial infarction) ([place]) 06/24/2017  . Renal hematoma   . Right renal mass    Past Surgical History:  Procedure Laterality Date  . ABDOMINAL HYSTERECTOMY    . CORONARY ARTERY BYPASS GRAFT  2006   [place], [place]  . CORONARY STENT INTERVENTION N/A 06/25/2017   Procedure: CORONARY STENT INTERVENTION;  Surgeon:  Jettie Booze, MD;  Location: [facility];  Service: Cardiovascular;  Laterality: N/A;  . IR RADIOLOGIST EVAL & MGMT  07/08/2019  . LEFT HEART CATH AND CORS/GRAFTS ANGIOGRAPHY N/A 06/25/2017   Procedure: LEFT HEART CATH AND CORS/GRAFTS ANGIOGRAPHY;  Surgeon: Jettie Booze, MD;  Location: [facility];  Service: Cardiovascular;  Laterality: N/A;    SOCIAL HISTORY:  Social History   Socioeconomic History  . Marital status: Widowed    Spouse name: Not on file  . Number of children: 6  . Years of education: Not on file  . Highest education level: Not on file  Occupational History  . Occupation: retired  Tobacco Use  . Smoking status: Never Smoker  . Smokeless tobacco: Never Used  Vaping Use  . Vaping Use: Never used  Substance and Sexual Activity  . Alcohol use: No  . Drug use: No  . Sexual activity: Never  Other Topics Concern  . Not on file  Social History Narrative   Lives in [place] with her daughter and grandchildren.   Social Determinants of Health   Financial Resource Strain: Medium Risk  . Difficulty of Paying Living Expenses: Somewhat hard  Food Insecurity: No Food Insecurity  . Worried About Charity fundraiser in the Last Year: Never true  . Ran Out of Food in the Last Year: Never true  Transportation Needs: No Transportation Needs  . Lack of Transportation (Medical): No  . Lack of Transportation (Non-Medical): No  Physical Activity: Inactive  . Days of  Exercise per Week: 0 days  . Minutes of Exercise per Session: 0 min  Stress: No Stress Concern Present  . Feeling of Stress : Only a little  Social Connections: Moderately Isolated  . Frequency of Communication with Friends and Family: Three times a week  . Frequency of Social Gatherings with Friends and Family: Three times a week  . Attends Religious Services: 1 to 4 times per year  . Active Member of Clubs or Organizations: No  . Attends Archivist Meetings: Never  .  Marital Status: Widowed  Intimate Partner Violence: Not At Risk  . Fear of Current or Ex-Partner: No  . Emotionally Abused: No  . Physically Abused: No  . Sexually Abused: No    FAMILY HISTORY:  Family History  Problem Relation Age of Onset  . Heart attack Mother   . Hypertension Mother   . Seizures Son   . Seizures Son   . Seizures Daughter     CURRENT MEDICATIONS:  Current Outpatient Medications  Medication Sig Dispense Refill  . amLODipine (NORVASC) 5 MG tablet Take 1 tablet (5 mg total) by mouth daily. 90 tablet 1  . atorvastatin (LIPITOR) 80 MG tablet Take 1 tablet (80 mg total) by mouth daily. 90 tablet 1  . calcium carbonate (OSCAL) 1500 (600 Ca) MG TABS tablet Take 1,500 mg by mouth daily with breakfast.     . cholecalciferol (VITAMIN D) 25 MCG (1000 UT) tablet Take 1,000 Units by mouth daily.     Mar[place] Kitchen donepezil (ARICEPT) 10 MG tablet Take 1 tablet (10 mg total) by mouth at bedtime. 30 tablet 2  . methimazole (TAPAZOLE) 5 MG tablet TAKE ONE TABLET BY MOUTH DAILY. (Patient taking differently: Take 5 mg by mouth daily. ) 30 tablet 2  . metoprolol succinate (TOPROL XL) 25 MG 24 hr tablet Take 1 tablet (25 mg total) by mouth daily. 90 tablet 3  . hydrocortisone (ANUSOL-HC) 25 MG suppository Place 1 suppository (25 mg total) rectally daily as needed for hemorrhoids or anal itching. (Patient not taking: Reported on 09/20/2019) 6 suppository 0  . nitroGLYCERIN (NITROSTAT) 0.4 MG SL tablet Place 1 tablet (0.4 mg total) under the tongue every 5 (five) minutes x 3 doses as needed for chest pain. (Patient not taking: Reported on 09/20/2019) 25 tablet 3   No current facility-administered medications for this visit.    ALLERGIES:  No Known Allergies  PHYSICAL EXAM:  Performance status (ECOG): 1 - Symptomatic but completely ambulatory  Vitals:   09/20/19 1601  BP: (!) 166/71  Pulse: 98  Resp: 18  Temp: 97.7 F (36.5 C)  SpO2: 100%   Wt Readings from Last 3 Encounters:  09/20/19  111 lb 11.2 oz (50.7 kg)  08/19/19 112 lb (50.8 kg)  08/10/19 115 lb 8.3 oz (52.4 kg)   Physical Exam Vitals reviewed.  Constitutional:      Appearance: Normal appearance.  Neck:     Thyroid: Thyromegaly (bilat) present.  Cardiovascular:     Rate and Rhythm: Normal rate and regular rhythm.     Pulses: Normal pulses.     Heart sounds: Normal heart sounds.  Pulmonary:     Effort: Pulmonary effort is normal.     Breath sounds: Normal breath sounds.  Musculoskeletal:     Right lower leg: No edema.     Left lower leg: No edema.  Lymphadenopathy:     Cervical: No cervical adenopathy.  Neurological:     General: No focal deficit present.  Mental Status: She is alert and oriented to person, place, and time.  Psychiatric:        Mood and Affect: Mood normal.        Behavior: Behavior normal.      LABORATORY DATA:  I have reviewed the labs as listed.  CBC Latest Ref Rng & Units 08/19/2019 08/10/2019 08/06/2019  WBC 3.4 - 10.8 x10E3/uL 7.0 8.4 9.2  Hemoglobin 11.1 - 15.9 g/dL 10.5(L) 8.8(L) 9.8(L)  Hematocrit 34.0 - 46.6 % 30.9(L) 26.0(L) 29.0(L)  Platelets 150 - 450 x10E3/uL 371 302 315   CMP Latest Ref Rng & Units 08/27/2019 08/19/2019 08/10/2019  Glucose 65 - 99 mg/dL 130(H) 127(H) 109(H)  BUN 8 - 27 mg/dL 30(H) 26 37(H)  Creatinine 0.57 - 1.00 mg/dL 1.95(H) 2.16(H) 2.55(H)  Sodium 134 - 144 mmol/L 142 147(H) 139  Potassium 3.5 - 5.2 mmol/L 3.9 6.0(HH) 3.7  Chloride 96 - 106 mmol/L 104 107(H) 107  CO2 20 - 29 mmol/L 24 19(L) 23  Calcium 8.7 - 10.3 mg/dL 9.5 10.0 8.9  Total Protein 6.0 - 8.5 g/dL - 6.3 6.2(L)  Total Bilirubin 0.0 - 1.2 mg/dL - 0.4 0.7  Alkaline Phos 39 - 117 IU/L - 125(H) 76  AST 0 - 40 IU/L - 13 14(L)  ALT 0 - 32 IU/L - 12 19    DIAGNOSTIC IMAGING:  I have independently reviewed the scans and discussed with the patient.   ASSESSMENT:  1.  Right kidney papillary RCC: -She had renal mass biopsy on 8/34/1962, complicated by hematoma into the right renal  capsule.  She had to have CT scans done subsequently. 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   PLAN:  1.  Right kidney papillary RCC: -She has a follow-up appointment with Dr. Alyson Ingles on 09/29/2019. -She will undergo either nephrectomy or ablation procedure. -I will see her back in 3 months for follow-up.  2.  Thyromegaly: -She has enlargement of thyroid gland but does not have any difficulty swallowing. -She follows up with Dr. Harlow Asa.  3.  CKD: -Her latest creatinine is 1.95 on 08/27/2019.  4.  Normocytic anemia: -Last CBC on 08/19/2019 was 10.5 with MCV of 91. -We plan to check her ferritin, iron panel, I29 and folic acid.    Orders placed this encounter:  No orders of the defined types were placed in this encounter.    Derek Jack, MD [place] [phone]   I, Milinda Antis, am acting as a scribe for Dr. Sanda Linger.  I, Derek Jack MD, have reviewed the above documentation for accuracy and completeness, and I agree with the above.

## 2019-09-29 ENCOUNTER — Ambulatory Visit: Payer: Medicare HMO | Admitting: Urology

## 2019-09-30 ENCOUNTER — Telehealth: Payer: Self-pay

## 2019-09-30 NOTE — Telephone Encounter (Signed)
Returned call regarding rescheduling missed appt. New appt made and caretaker made aware.

## 2019-10-11 ENCOUNTER — Other Ambulatory Visit: Payer: Self-pay

## 2019-10-11 ENCOUNTER — Emergency Department (HOSPITAL_COMMUNITY)
Admission: EM | Admit: 2019-10-11 | Discharge: 2019-10-11 | Disposition: A | Discharge status: Home / Self Care | Payer: Medicare HMO | Attending: Emergency Medicine | Admitting: Emergency Medicine

## 2019-10-11 ENCOUNTER — Encounter (HOSPITAL_COMMUNITY): Payer: Self-pay | Admitting: Emergency Medicine

## 2019-10-11 DIAGNOSIS — E1122 Type 2 diabetes mellitus with diabetic chronic kidney disease: Secondary | ICD-10-CM | POA: Insufficient documentation

## 2019-10-11 DIAGNOSIS — R1084 Generalized abdominal pain: Secondary | ICD-10-CM | POA: Diagnosis present

## 2019-10-11 DIAGNOSIS — R61 Generalized hyperhidrosis: Secondary | ICD-10-CM | POA: Insufficient documentation

## 2019-10-11 DIAGNOSIS — Z79899 Other long term (current) drug therapy: Secondary | ICD-10-CM | POA: Insufficient documentation

## 2019-10-11 DIAGNOSIS — I252 Old myocardial infarction: Secondary | ICD-10-CM | POA: Insufficient documentation

## 2019-10-11 DIAGNOSIS — Z85528 Personal history of other malignant neoplasm of kidney: Secondary | ICD-10-CM | POA: Diagnosis not present

## 2019-10-11 DIAGNOSIS — Z951 Presence of aortocoronary bypass graft: Secondary | ICD-10-CM | POA: Diagnosis not present

## 2019-10-11 DIAGNOSIS — I129 Hypertensive chronic kidney disease with stage 1 through stage 4 chronic kidney disease, or unspecified chronic kidney disease: Secondary | ICD-10-CM | POA: Insufficient documentation

## 2019-10-11 DIAGNOSIS — I2510 Atherosclerotic heart disease of native coronary artery without angina pectoris: Secondary | ICD-10-CM | POA: Diagnosis not present

## 2019-10-11 DIAGNOSIS — N183 Chronic kidney disease, stage 3 unspecified: Secondary | ICD-10-CM | POA: Diagnosis not present

## 2019-10-11 LAB — URINALYSIS, ROUTINE W REFLEX MICROSCOPIC
Bacteria, UA: NONE SEEN
Bilirubin Urine: NEGATIVE
Glucose, UA: 50 mg/dL — AB
Ketones, ur: NEGATIVE mg/dL
Leukocytes,Ua: NEGATIVE
Nitrite: NEGATIVE
Protein, ur: 100 mg/dL — AB
Specific Gravity, Urine: 1.009 (ref 1.005–1.030)
pH: 7 (ref 5.0–8.0)

## 2019-10-11 LAB — COMPREHENSIVE METABOLIC PANEL
ALT: 11 U/L (ref 0–44)
AST: 16 U/L (ref 15–41)
Albumin: 4.1 g/dL (ref 3.5–5.0)
Alkaline Phosphatase: 90 U/L (ref 38–126)
Anion gap: 10 (ref 5–15)
BUN: 30 mg/dL — ABNORMAL HIGH (ref 8–23)
CO2: 24 mmol/L (ref 22–32)
Calcium: 9.1 mg/dL (ref 8.9–10.3)
Chloride: 104 mmol/L (ref 98–111)
Creatinine, Ser: 1.64 mg/dL — ABNORMAL HIGH (ref 0.44–1.00)
GFR calc Af Amer: 35 mL/min — ABNORMAL LOW (ref >60)
GFR calc non Af Amer: 30 mL/min — ABNORMAL LOW (ref >60)
Glucose, Bld: 165 mg/dL — ABNORMAL HIGH (ref 70–99)
Potassium: 3.2 mmol/L — ABNORMAL LOW (ref 3.5–5.1)
Sodium: 138 mmol/L (ref 135–145)
Total Bilirubin: 0.7 mg/dL (ref 0.3–1.2)
Total Protein: 7.3 g/dL (ref 6.5–8.1)

## 2019-10-11 LAB — CBC WITH DIFFERENTIAL/PLATELET
Abs Immature Granulocytes: 0.01 10*3/uL (ref 0.00–0.07)
Basophils Absolute: 0 10*3/uL (ref 0.0–0.1)
Basophils Relative: 1 %
Eosinophils Absolute: 0.1 10*3/uL (ref 0.0–0.5)
Eosinophils Relative: 2 %
HCT: 35.6 % — ABNORMAL LOW (ref 36.0–46.0)
Hemoglobin: 11.7 g/dL — ABNORMAL LOW (ref 12.0–15.0)
Immature Granulocytes: 0 %
Lymphocytes Relative: 23 %
Lymphs Abs: 1.3 10*3/uL (ref 0.7–4.0)
MCH: 31 pg (ref 26.0–34.0)
MCHC: 32.9 g/dL (ref 30.0–36.0)
MCV: 94.4 fL (ref 80.0–100.0)
Monocytes Absolute: 0.4 10*3/uL (ref 0.1–1.0)
Monocytes Relative: 7 %
Neutro Abs: 3.8 10*3/uL (ref 1.7–7.7)
Neutrophils Relative %: 67 %
Platelets: 190 10*3/uL (ref 150–400)
RBC: 3.77 MIL/uL — ABNORMAL LOW (ref 3.87–5.11)
RDW: 12.5 % (ref 11.5–15.5)
WBC: 5.7 10*3/uL (ref 4.0–10.5)
nRBC: 0 % (ref 0.0–0.2)

## 2019-10-11 LAB — LIPASE, BLOOD: Lipase: 90 U/L — ABNORMAL HIGH (ref 11–51)

## 2019-10-11 MED ORDER — FENTANYL CITRATE (PF) 100 MCG/2ML IJ SOLN
50.0000 ug | Freq: Once | INTRAMUSCULAR | Status: AC
  Administered 2019-10-11: 50 ug via INTRAVENOUS
  Filled 2019-10-11: qty 2

## 2019-10-11 NOTE — ED Notes (Signed)
Multiple attempts to reach several family members with no answer.

## 2019-10-11 NOTE — ED Triage Notes (Signed)
Pt daughter called EMS c/o that pt was having excessive sweating and wanted pt transported for further evaluation.

## 2019-10-11 NOTE — Discharge Instructions (Addendum)
All of your testing tonight looks normal.  No signs of any infection or inflammation in your stomach or abdomen.  No signs of any urine infection.  It appears that your sweating has improved.  You did not have a fever   SEEK IMMEDIATE MEDICAL ATTENTION IF: The pain does not go away or becomes severe, particularly over the next 8-12 hours.  A temperature above 100.40F develops.  Repeated vomiting occurs (multiple episodes).  The pain becomes localized to portions of the abdomen. The right side could possibly be appendicitis. In an adult, the left lower portion of the abdomen could be colitis or diverticulitis.  Blood is being passed in stools or vomit (bright red or black tarry stools).  Return also if you develop chest pain, difficulty breathing, dizziness or fainting, or become confused, poorly responsive, or inconsolable.

## 2019-10-11 NOTE — ED Provider Notes (Signed)
[facility] EMERGENCY DEPARTMENT Provider Note   CSN: [id number] Arrival date & time: 10/11/19  0107     History Chief Complaint  Patient presents with  . Excessive Sweating    Erin Jordan is a 74 y.o. female.  The history is provided by the patient.  Abdominal Pain Pain location:  Generalized Pain severity:  Moderate Onset quality:  Gradual Timing:  Constant Progression:  Worsening Chronicity:  New Relieved by:  Nothing Worsened by:  Palpation Associated symptoms: no chest pain, no fever, no shortness of breath and no vomiting    Patient history of CAD, chronic kidney disease, renal carcinoma presents with abdominal pain.  She reports prior to arrival she began having abdominal pain.  Her daughter gave her a medication for this and she began sweating.  Family then called 911.  No fevers or vomiting.  No chest pain/shortness of breath.  She does not recall having his pain previously.  She is otherwise been at her baseline.  She reports eating a dinner baked chicken and nothing else unusual    Past Medical History:  Diagnosis Date  . Arthritis   . Cancer of kidney ([place])   . Chronic kidney disease, stage 3, mod decreased GFR   . Coronary atherosclerosis of native coronary artery 2006   Multivessel status post CABG in [place], graft disease documented March 2019 with DES to SVG to diagonal  . Essential hypertension   . 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 History of diabetes mellitus, type II   . Hyperlipidemia   . NSTEMI (non-ST elevated myocardial infarction) ([place]) 06/24/2017  . Renal hematoma   . Right renal mass     Patient Active Problem List   Diagnosis Date Noted  . ARF (acute renal failure) ([place]) 08/02/2019  . Renal cell carcinoma ([place]) 08/02/2019  . Postoperative surgical complication involving genitourinary system associated with genitourinary procedure 08/02/2019  . Acute blood loss anemia 08/02/2019  . Renal hematoma 08/02/2019  . RLS  (restless legs syndrome) 01/25/2019  . Right renal mass 01/25/2019  . Splenic mass 01/25/2019  . Blood in stool 01/25/2019  . Dementia without behavioral disturbance ([place]) 01/25/2019  . Hyperthyroidism 09/14/2018  . Osteopenia 05/20/2018  . Multinodular goiter 10/14/2017  . Weight loss, unintentional 07/14/2017  . CAD -S/P PCI 06/26/17 06/27/2017  . 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 Hx of CABG- 2006   . History of diabetes mellitus   . Essential hypertension   . Stage 3 chronic kidney disease   . Hypokalemia 06/02/2011  . Dyslipidemia, goal LDL below 70 06/02/2011    Past Surgical History:  Procedure Laterality Date  . ABDOMINAL HYSTERECTOMY    . CORONARY ARTERY BYPASS GRAFT  2006   [place], [place]  . CORONARY STENT INTERVENTION N/A 06/25/2017   Procedure: CORONARY STENT INTERVENTION;  Surgeon: Jettie Booze, MD;  Location: [place] [facility];  Service: Cardiovascular;  Laterality: N/A;  . IR RADIOLOGIST EVAL & MGMT  07/08/2019  . LEFT HEART CATH AND CORS/GRAFTS ANGIOGRAPHY N/A 06/25/2017   Procedure: LEFT HEART CATH AND CORS/GRAFTS ANGIOGRAPHY;  Surgeon: Jettie Booze, MD;  Location: [place] [facility];  Service: Cardiovascular;  Laterality: N/A;     OB History    Gravida      Para      Term      Preterm      AB      Living  6     SAB  TAB      Ectopic      Multiple      Live Births              Family History  Problem Relation Age of Onset  . Heart attack Mother   . Hypertension Mother   . Seizures Son   . Seizures Son   . Seizures Daughter     Social History   Tobacco Use  . Smoking status: Never Smoker  . Smokeless tobacco: Never Used  Vaping Use  . Vaping Use: Never used  Substance Use Topics  . Alcohol use: No  . Drug use: No    Home Medications Prior to Admission medications   Medication Sig Start Date End Date Taking? Authorizing Provider  amLODipine (NORVASC) 5 MG tablet Take 1 tablet (5 mg total) by  mouth daily. 08/19/19   [name], FNP  atorvastatin (LIPITOR) 80 MG tablet Take 1 tablet (80 mg total) by mouth daily. 08/19/19   [name], FNP  calcium carbonate (OSCAL) 1500 (600 Ca) MG TABS tablet Take 1,500 mg by mouth daily with breakfast.     [provider]  cholecalciferol (VITAMIN D) 25 MCG (1000 UT) tablet Take 1,000 Units by mouth daily.     [provider]  donepezil (ARICEPT) 10 MG tablet Take 1 tablet (10 mg total) by mouth at bedtime. 03/24/19   [name], FNP  hydrocortisone (ANUSOL-HC) 25 MG suppository Place 1 suppository (25 mg total) rectally daily as needed for hemorrhoids or anal itching. Patient not taking: Reported on 09/20/2019 08/10/19   Nuala Alpha A, PA-C  methimazole (TAPAZOLE) 5 MG tablet TAKE ONE TABLET BY MOUTH DAILY. Patient taking differently: Take 5 mg by mouth daily.  08/02/19   [name], FNP  metoprolol succinate (TOPROL XL) 25 MG 24 hr tablet Take 1 tablet (25 mg total) by mouth daily. 06/24/19   Strader, Fransisco Hertz, PA-C  nitroGLYCERIN (NITROSTAT) 0.4 MG SL tablet Place 1 tablet (0.4 mg total) under the tongue every 5 (five) minutes x 3 doses as needed for chest pain. Patient not taking: Reported on 09/20/2019 07/07/19   Satira Sark, MD    Allergies    Patient has no known allergies.  Review of Systems   Review of Systems  Constitutional: Positive for diaphoresis. Negative for fever.  Respiratory: Negative for shortness of breath.   Cardiovascular: Negative for chest pain.  Gastrointestinal: Positive for abdominal pain. Negative for vomiting.  Neurological: Negative for headaches.  All other systems reviewed and are negative.   Physical Exam Updated Vital Signs BP (!) 141/100 (BP Location: Left Arm)   Pulse (!) 106   Temp 97.7 F (36.5 C) (Oral)   Resp 18   Ht 1.575 m (5\' 2" )   Wt 51 kg   SpO2 100%   BMI 20.56 kg/m   Physical Exam CONSTITUTIONAL: Elderly, no acute distress HEAD:  Normocephalic/atraumatic EYES: EOMI/PERRL ENMT: Mucous membranes moist NECK: supple no meningeal signs SPINE/BACK:entire spine nontender CV: S1/S2 noted, no murmurs/rubs/gallops noted LUNGS: Lungs are clear to auscultation bilaterally, no apparent distress ABDOMEN: soft, mild diffuse tenderness, no rebound or guarding, bowel sounds noted throughout abdomen GU:no cva tenderness NEURO: Pt is awake/alert/appropriate, moves all extremitiesx4.  No facial droop.  EXTREMITIES: pulses normal/equal, full ROM SKIN: warm, color normal patient is not diaphoretic PSYCH: no abnormalities of mood noted, alert and oriented to situation  ED Results / Procedures / Treatments   Labs (all labs  ordered are listed, but only abnormal results are displayed) Labs Reviewed  CBC WITH DIFFERENTIAL/PLATELET - Abnormal; Notable for the following components:      Result Value   RBC 3.77 (*)    Hemoglobin 11.7 (*)    HCT 35.6 (*)    All other components within normal limits  COMPREHENSIVE METABOLIC PANEL - Abnormal; Notable for the following components:   Potassium 3.2 (*)    Glucose, Bld 165 (*)    BUN 30 (*)    Creatinine, Ser 1.64 (*)    GFR calc non Af Amer 30 (*)    GFR calc Af Amer 35 (*)    All other components within normal limits  LIPASE, BLOOD - Abnormal; Notable for the following components:   Lipase 90 (*)    All other components within normal limits  URINALYSIS, ROUTINE W REFLEX MICROSCOPIC - Abnormal; Notable for the following components:   Color, Urine STRAW (*)    Glucose, UA 50 (*)    Hgb urine dipstick SMALL (*)    Protein, ur 100 (*)    All other components within normal limits    EKG EKG Interpretation  Date/Time:  Monday October 11 2019 01:21:52 EDT Ventricular Rate:  83 PR Interval:    QRS Duration: 101 QT Interval:  391 QTC Calculation: 460 R Axis:   0 Text Interpretation: Sinus rhythm Probable left atrial enlargement Minimal ST depression, inferior leads Baseline wander in  lead(s) II III aVF No significant change since last tracing Confirmed by Ripley Fraise [phone]) on 10/11/2019 1:39:49 AM   Radiology No results found.  Procedures Procedures Medications Ordered in ED Medications  fentaNYL (SUBLIMAZE) injection 50 mcg (50 mcg Intravenous Given 10/11/19 0154)    ED Course  I have reviewed the triage vital signs and the nursing notes.  Pertinent labs  results that were available during my care of the patient were reviewed by me and considered in my medical decision making (see chart for details).    MDM Rules/Calculators/A&P                         1:44 AM Attempted to call family for corroborating  information but no answer 4:11 AM Patient has been monitored for several hours.  She is now back to baseline and denies any complaints No vomiting here.  She is afebrile.  Labs appear near baseline.  No focal tenderness on repeat exam. Unclear cause of abdominal pain that she had earlier, but since pain is resolved no further work-up required Patient had multiple CT scans back in April 2021 due to a postprocedural peri-nephric hematoma. I do not feel further imaging is required. We discussed strict ER return precautions.   This patient presents to the ED for concern of abdominal pain and sweating, this involves an extensive number of treatment options, and is a complaint that carries with it a high risk of complications and morbidity.  The differential diagnosis includes appendicitis, cholecystitis, pancreatitis, diverticulitis, bowel perforation   Lab Tests:   I Ordered, reviewed, and interpreted labs, which included electrolytes, complete blood count, lipase, liver function testing  Medicines ordered:   I ordered medication fentanyl for pain    Additional history obtained:    Previous records obtained and reviewed    Reevaluation:  After the interventions stated above, I reevaluated the patient and found patient is improved   Final  Clinical Impression(s) / ED Diagnoses Final diagnoses:  Generalized abdominal pain  Rx / DC Orders ED Discharge Orders    None       Ripley Fraise, MD 10/11/19 [phone]

## 2019-10-14 ENCOUNTER — Other Ambulatory Visit: Payer: Self-pay

## 2019-10-14 ENCOUNTER — Emergency Department (HOSPITAL_COMMUNITY): Payer: Medicare HMO

## 2019-10-14 ENCOUNTER — Encounter (HOSPITAL_COMMUNITY): Payer: Self-pay | Admitting: Emergency Medicine

## 2019-10-14 ENCOUNTER — Observation Stay (HOSPITAL_COMMUNITY): Payer: Medicare HMO

## 2019-10-14 ENCOUNTER — Observation Stay (HOSPITAL_COMMUNITY)
Admission: EM | Admit: 2019-10-14 | Discharge: 2019-10-15 | Disposition: A | Discharge status: Home / Self Care | Payer: Medicare HMO | Attending: Family Medicine | Admitting: Family Medicine

## 2019-10-14 DIAGNOSIS — E0590 Thyrotoxicosis, unspecified without thyrotoxic crisis or storm: Secondary | ICD-10-CM | POA: Diagnosis not present

## 2019-10-14 DIAGNOSIS — E785 Hyperlipidemia, unspecified: Secondary | ICD-10-CM | POA: Diagnosis present

## 2019-10-14 DIAGNOSIS — I129 Hypertensive chronic kidney disease with stage 1 through stage 4 chronic kidney disease, or unspecified chronic kidney disease: Secondary | ICD-10-CM | POA: Diagnosis not present

## 2019-10-14 DIAGNOSIS — Z20822 Contact with and (suspected) exposure to covid-19: Secondary | ICD-10-CM | POA: Diagnosis not present

## 2019-10-14 DIAGNOSIS — R42 Dizziness and giddiness: Secondary | ICD-10-CM | POA: Insufficient documentation

## 2019-10-14 DIAGNOSIS — R531 Weakness: Secondary | ICD-10-CM | POA: Insufficient documentation

## 2019-10-14 DIAGNOSIS — I10 Essential (primary) hypertension: Secondary | ICD-10-CM | POA: Diagnosis present

## 2019-10-14 DIAGNOSIS — N183 Chronic kidney disease, stage 3 unspecified: Secondary | ICD-10-CM | POA: Diagnosis present

## 2019-10-14 DIAGNOSIS — E876 Hypokalemia: Secondary | ICD-10-CM | POA: Diagnosis present

## 2019-10-14 DIAGNOSIS — Z79899 Other long term (current) drug therapy: Secondary | ICD-10-CM | POA: Diagnosis not present

## 2019-10-14 DIAGNOSIS — R4182 Altered mental status, unspecified: Principal | ICD-10-CM | POA: Diagnosis present

## 2019-10-14 DIAGNOSIS — I2510 Atherosclerotic heart disease of native coronary artery without angina pectoris: Secondary | ICD-10-CM

## 2019-10-14 DIAGNOSIS — E119 Type 2 diabetes mellitus without complications: Secondary | ICD-10-CM | POA: Diagnosis not present

## 2019-10-14 DIAGNOSIS — Z9861 Coronary angioplasty status: Secondary | ICD-10-CM | POA: Diagnosis not present

## 2019-10-14 DIAGNOSIS — F0390 Unspecified dementia without behavioral disturbance: Secondary | ICD-10-CM | POA: Diagnosis present

## 2019-10-14 LAB — CBC WITH DIFFERENTIAL/PLATELET
Abs Immature Granulocytes: 0.01 10*3/uL (ref 0.00–0.07)
Basophils Absolute: 0 10*3/uL (ref 0.0–0.1)
Basophils Relative: 1 %
Eosinophils Absolute: 0.1 10*3/uL (ref 0.0–0.5)
Eosinophils Relative: 2 %
HCT: 39.3 % (ref 36.0–46.0)
Hemoglobin: 12.7 g/dL (ref 12.0–15.0)
Immature Granulocytes: 0 %
Lymphocytes Relative: 23 %
Lymphs Abs: 1.6 10*3/uL (ref 0.7–4.0)
MCH: 30.8 pg (ref 26.0–34.0)
MCHC: 32.3 g/dL (ref 30.0–36.0)
MCV: 95.4 fL (ref 80.0–100.0)
Monocytes Absolute: 0.5 10*3/uL (ref 0.1–1.0)
Monocytes Relative: 7 %
Neutro Abs: 4.6 10*3/uL (ref 1.7–7.7)
Neutrophils Relative %: 67 %
Platelets: 208 10*3/uL (ref 150–400)
RBC: 4.12 MIL/uL (ref 3.87–5.11)
RDW: 12.4 % (ref 11.5–15.5)
WBC: 6.8 10*3/uL (ref 4.0–10.5)
nRBC: 0 % (ref 0.0–0.2)

## 2019-10-14 LAB — TROPONIN I (HIGH SENSITIVITY)
Troponin I (High Sensitivity): 6 ng/L (ref <18)
Troponin I (High Sensitivity): 8 ng/L (ref <18)

## 2019-10-14 LAB — URINALYSIS, ROUTINE W REFLEX MICROSCOPIC
Bacteria, UA: NONE SEEN
Bilirubin Urine: NEGATIVE
Glucose, UA: NEGATIVE mg/dL
Hgb urine dipstick: NEGATIVE
Ketones, ur: NEGATIVE mg/dL
Leukocytes,Ua: NEGATIVE
Nitrite: NEGATIVE
Protein, ur: 100 mg/dL — AB
Specific Gravity, Urine: 1.009 (ref 1.005–1.030)
pH: 6 (ref 5.0–8.0)

## 2019-10-14 LAB — COMPREHENSIVE METABOLIC PANEL
ALT: 12 U/L (ref 0–44)
AST: 16 U/L (ref 15–41)
Albumin: 4.2 g/dL (ref 3.5–5.0)
Alkaline Phosphatase: 95 U/L (ref 38–126)
Anion gap: 13 (ref 5–15)
BUN: 33 mg/dL — ABNORMAL HIGH (ref 8–23)
CO2: 22 mmol/L (ref 22–32)
Calcium: 9.4 mg/dL (ref 8.9–10.3)
Chloride: 104 mmol/L (ref 98–111)
Creatinine, Ser: 1.86 mg/dL — ABNORMAL HIGH (ref 0.44–1.00)
GFR calc Af Amer: 30 mL/min — ABNORMAL LOW (ref >60)
GFR calc non Af Amer: 26 mL/min — ABNORMAL LOW (ref >60)
Glucose, Bld: 143 mg/dL — ABNORMAL HIGH (ref 70–99)
Potassium: 3.4 mmol/L — ABNORMAL LOW (ref 3.5–5.1)
Sodium: 139 mmol/L (ref 135–145)
Total Bilirubin: 0.6 mg/dL (ref 0.3–1.2)
Total Protein: 7.2 g/dL (ref 6.5–8.1)

## 2019-10-14 LAB — SARS CORONAVIRUS 2 BY RT PCR (HOSPITAL ORDER, PERFORMED IN ~~LOC~~ HOSPITAL LAB): SARS Coronavirus 2: NEGATIVE

## 2019-10-14 LAB — ETHANOL: Alcohol, Ethyl (B): <10 mg/dL (ref <10)

## 2019-10-14 LAB — GLUCOSE, CAPILLARY
Glucose-Capillary: 144 mg/dL — ABNORMAL HIGH (ref 70–99)
Glucose-Capillary: 105 mg/dL — ABNORMAL HIGH (ref 70–99)
Glucose-Capillary: 109 mg/dL — ABNORMAL HIGH (ref 70–99)

## 2019-10-14 LAB — MAGNESIUM: Magnesium: 2.2 mg/dL (ref 1.7–2.4)

## 2019-10-14 LAB — ACETAMINOPHEN LEVEL: Acetaminophen (Tylenol), Serum: <10 ug/mL — ABNORMAL LOW (ref 10–30)

## 2019-10-14 LAB — SALICYLATE LEVEL: Salicylate Lvl: <7 mg/dL — ABNORMAL LOW (ref 7.0–30.0)

## 2019-10-14 LAB — LIPASE, BLOOD: Lipase: 68 U/L — ABNORMAL HIGH (ref 11–51)

## 2019-10-14 IMAGING — DX DG CHEST 1V PORT
1 series · 1 of 1 positions shown · non-contrast
Comparison: [DATE]

CLINICAL DATA: Generalized weakness and pain

EXAM:
PORTABLE CHEST 1 VIEW

[chest ap]
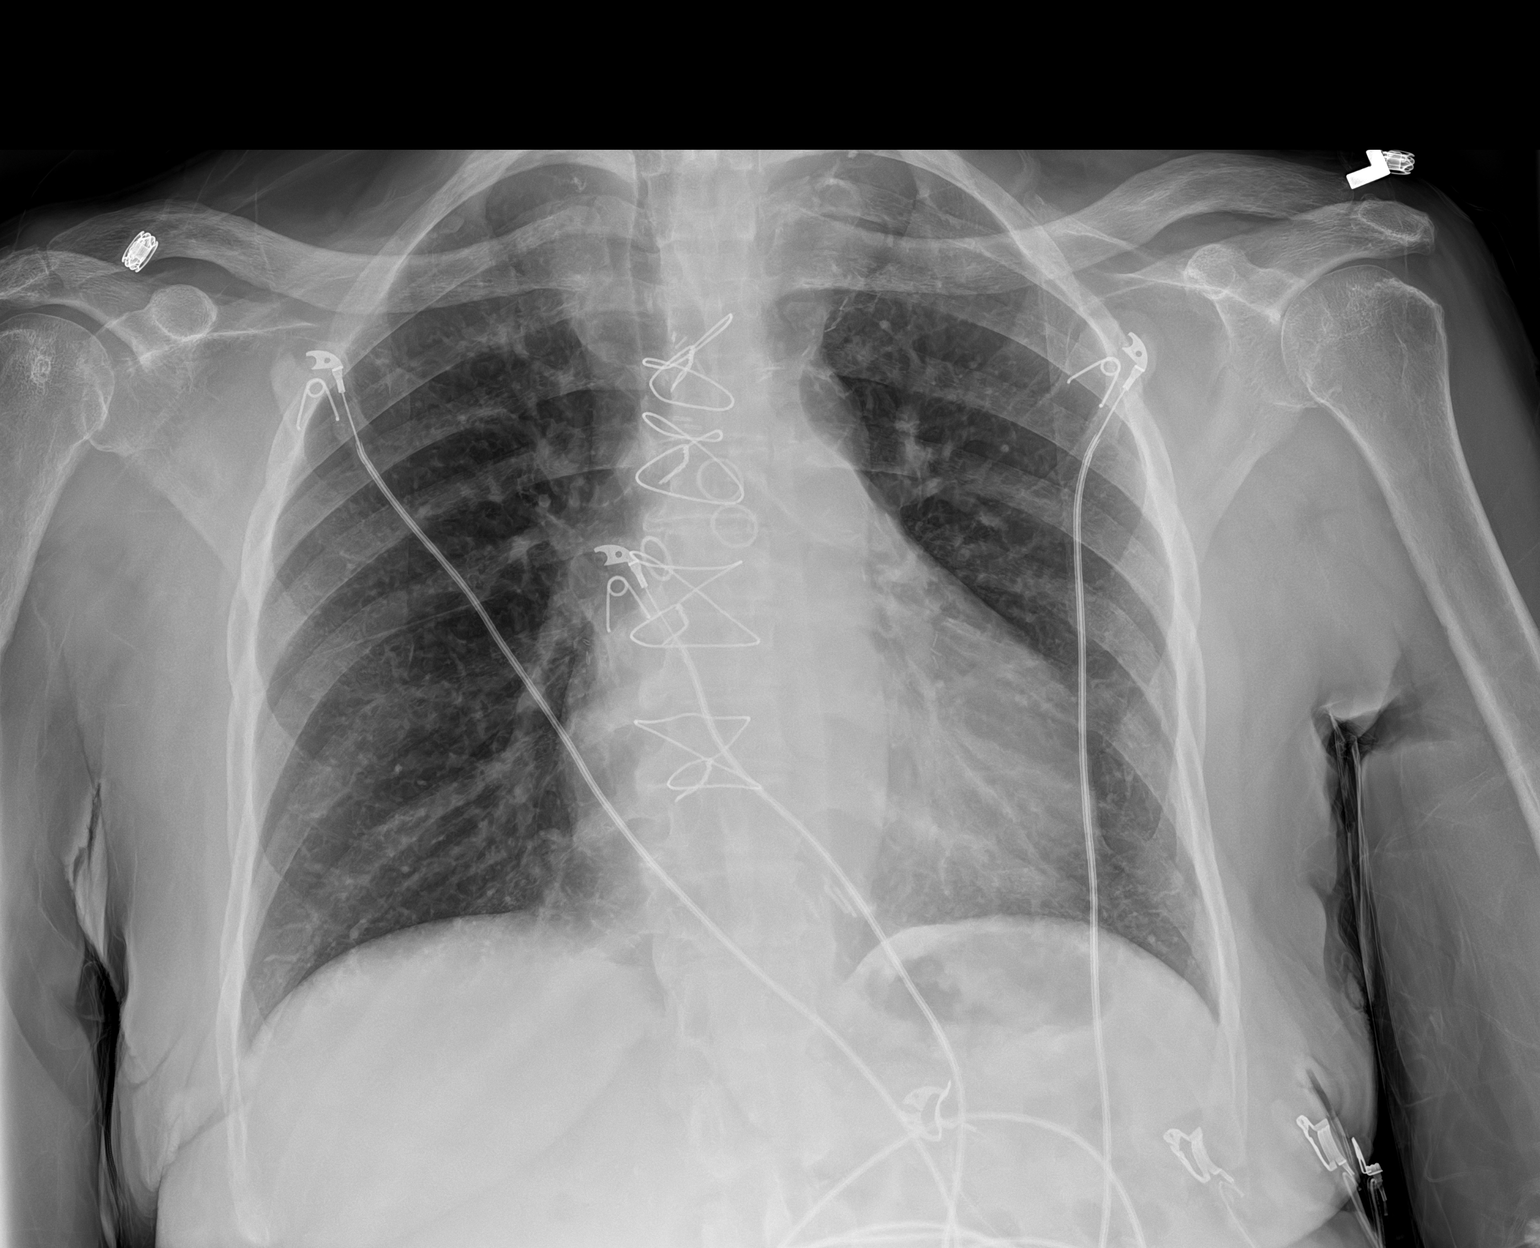

[1 of 1 positions shown; findings below may reference images not displayed]

FINDINGS: Cardiac shadow stable in appearance. Postsurgical changes are again
noted. The lungs are clear bilaterally. No focal infiltrate or
effusion is seen. No bony abnormality is noted.
IMPRESSION: No acute abnormality noted.

## 2019-10-14 IMAGING — CT CT HEAD W/O CM
3 series · 15 of 46 positions shown, 18 images · non-contrast
Comparison: CT [DATE]

CLINICAL DATA: Encephalopathy, generalized weakness for 1 hour

EXAM:
CT HEAD WITHOUT CONTRAST
TECHNIQUE: Contiguous axial images were obtained from the base of the skull
through the vertex without intravenous contrast.

[Series 3: head w o · axial · 0.45mm/px · z∈[+49,+169]mm · 9 of 29 slices shown, 12 images]
[im 3/29  brain]
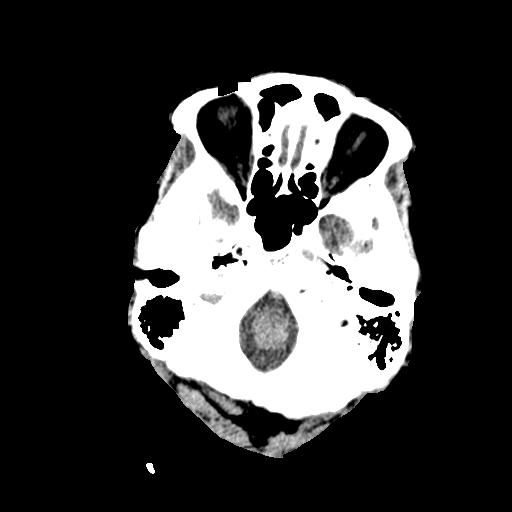
[im 3/29  bone]
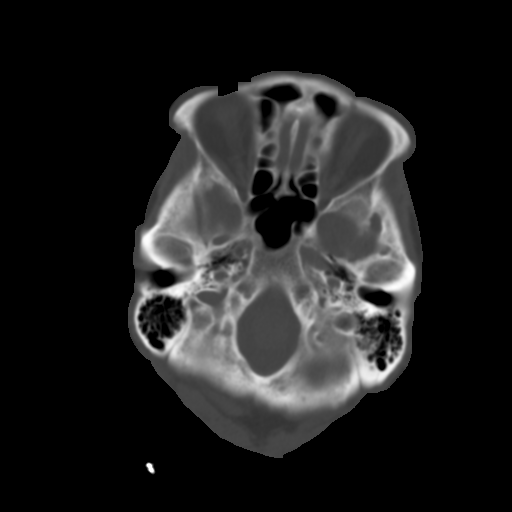
[im 6/29  brain]
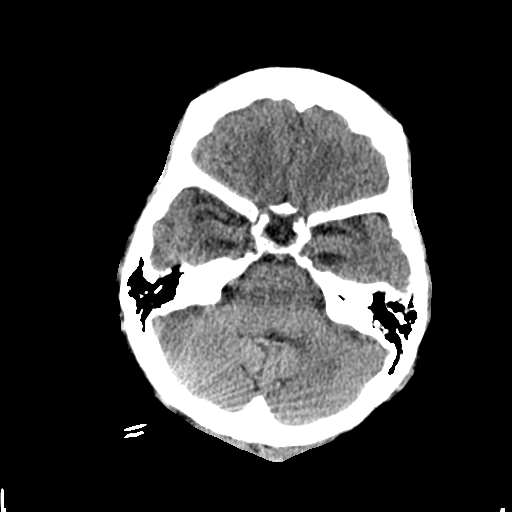
[im 9/29  brain]
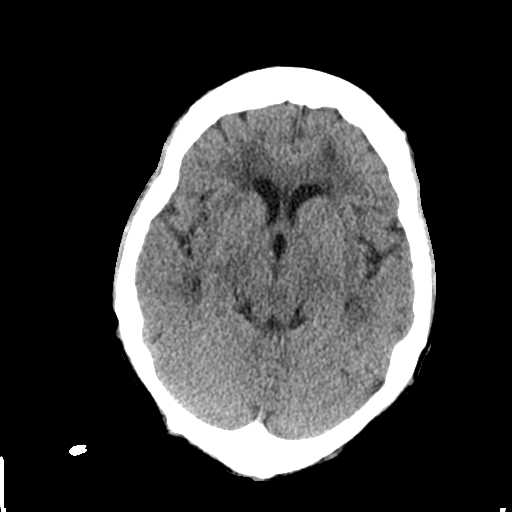
[im 12/29  brain]
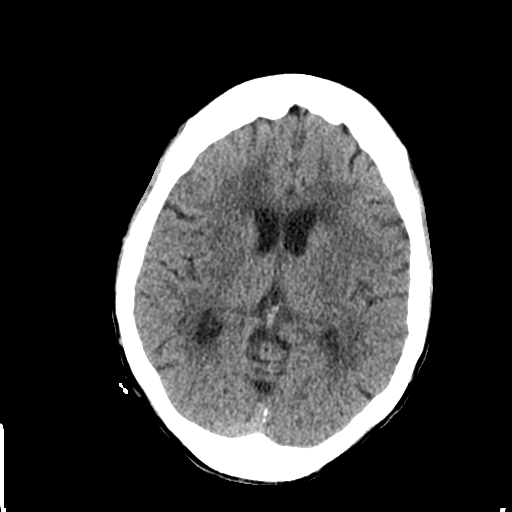
[im 15/29  brain]
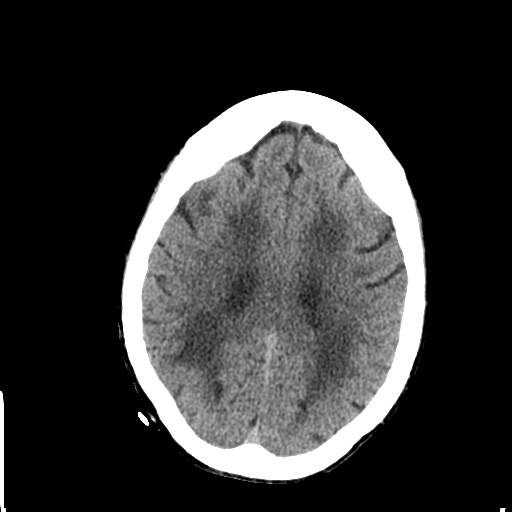
[im 15/29  bone]
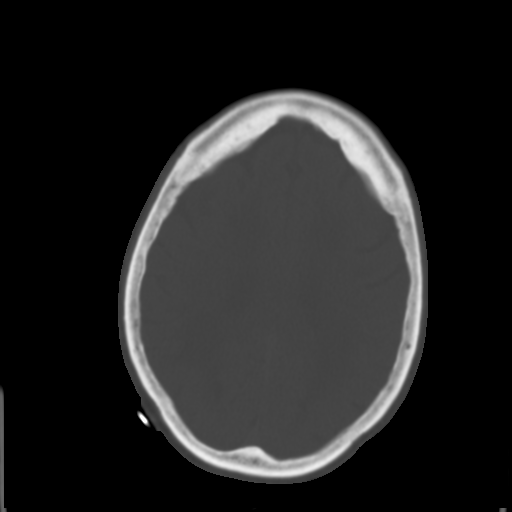
[im 18/29  brain]
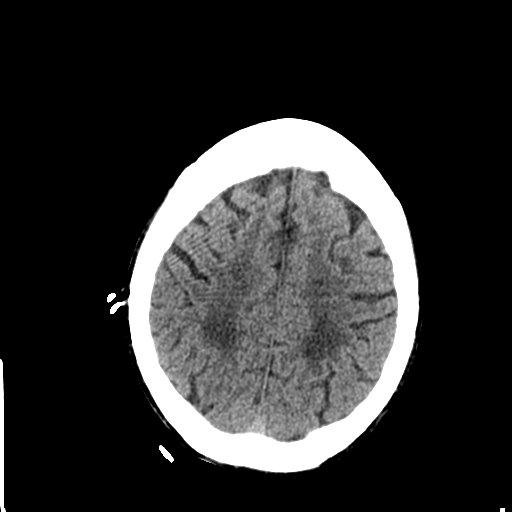
[im 21/29  brain]
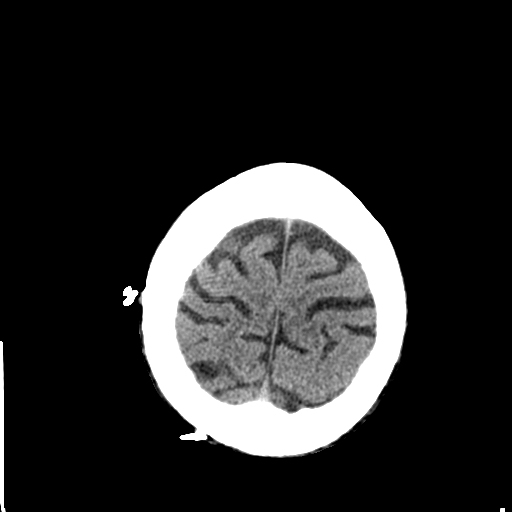
[im 24/29  brain]
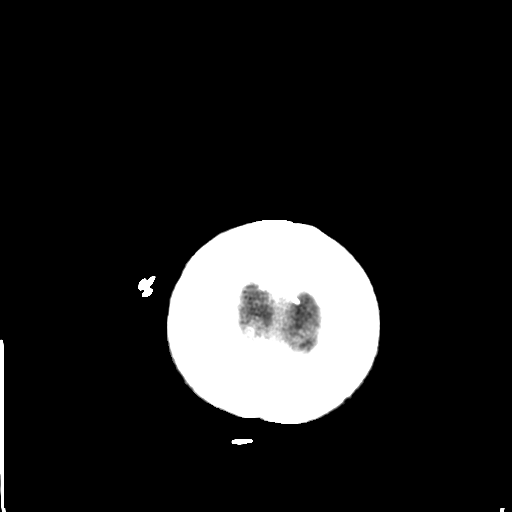
[im 27/29  brain]
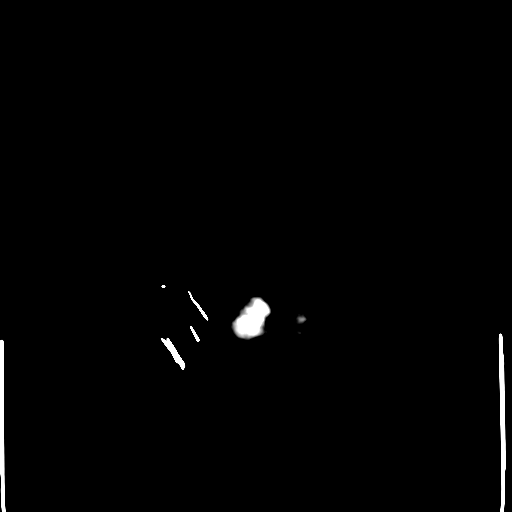
[im 27/29  bone]
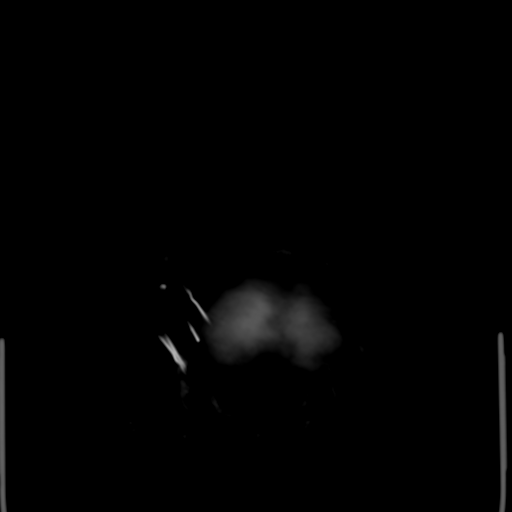

[Series 5: coronal soft · coronal · 0.28mm/px · 3 of 68 slices shown]
[im 23/68  brain]
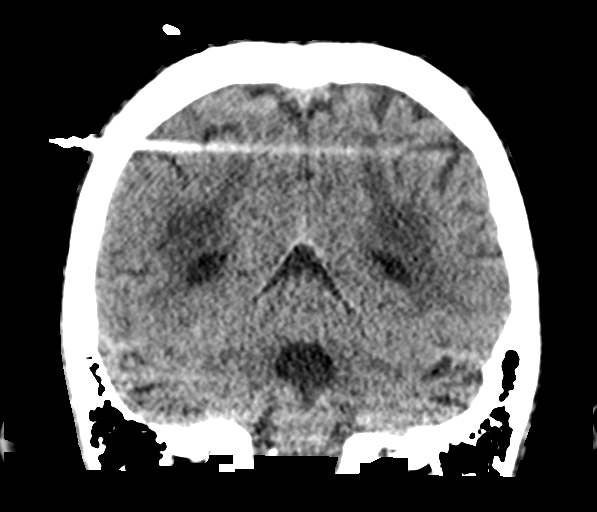
[im 30/68  brain]
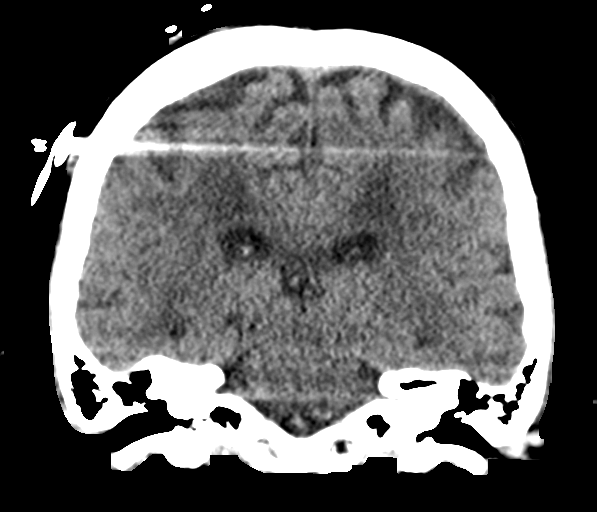
[im 38/68  brain]
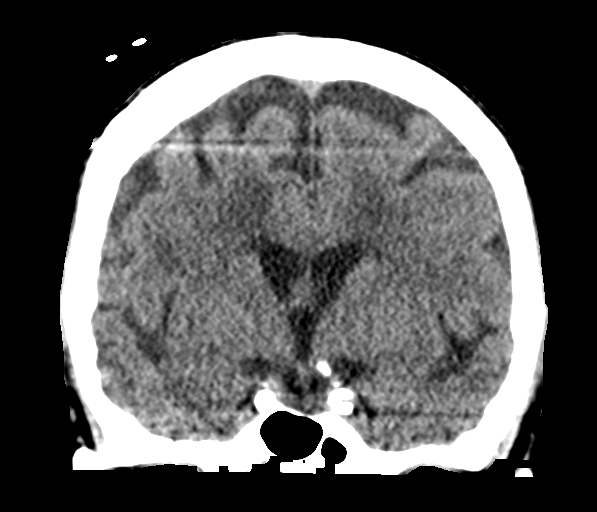

[Series 6: sagittal soft · sagittal · 0.31mm/px · 3 of 51 slices shown]
[im 17/51  brain]
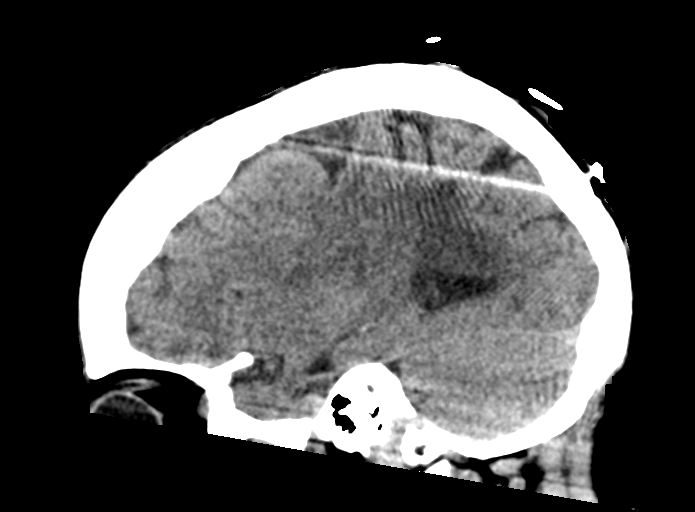
[im 26/51  brain]
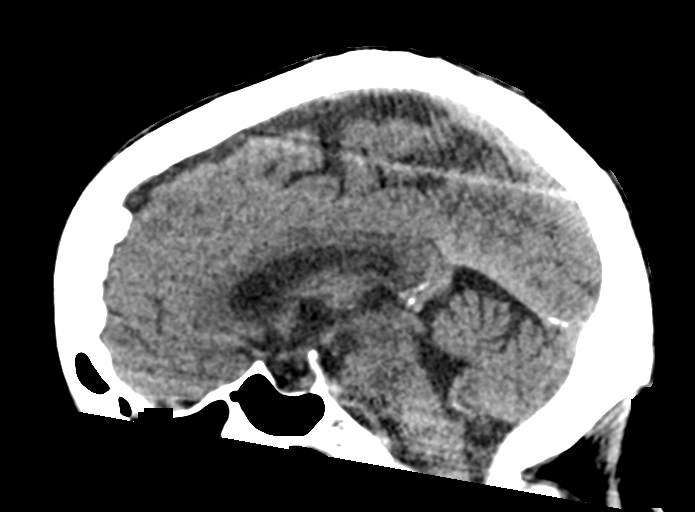
[im 34/51  brain]
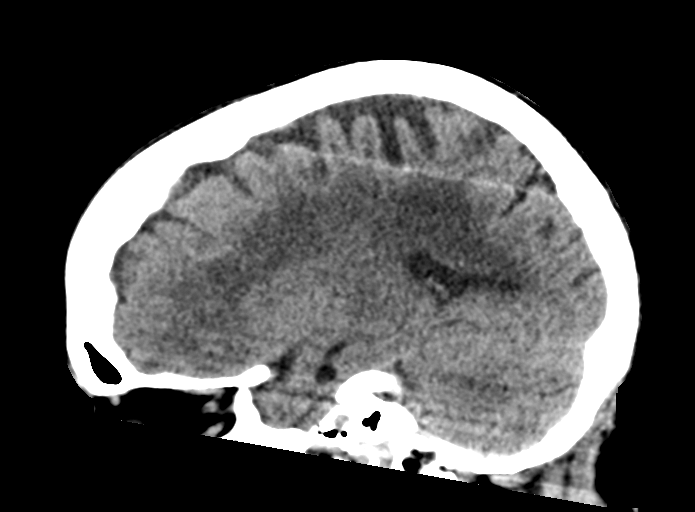

[15 of 46 positions shown; findings below may reference images not displayed]

FINDINGS: Brain: No evidence of acute infarction, hemorrhage, hydrocephalus,
extra-axial collection or mass lesion/mass effect. Symmetric
prominence of the ventricles, cisterns and sulci compatible with
parenchymal volume loss. Patchy areas of white matter
hypoattenuation are most compatible with chronic microvascular
angiopathy.

Vascular: Atherosclerotic calcification of the carotid siphons and
intradural right vertebral artery. No worrisome hyperdense vessel.

Skull: Several metallic hair pins are noted along the right scalp.
No significant scalp swelling or hematoma. No calvarial fracture or
worrisome osseous lesion. Mild benign hyperostosis frontalis
interna.

Sinuses/Orbits: Paranasal sinuses and mastoid air cells are
predominantly clear. Included orbital structures are unremarkable.

Other: None
IMPRESSION: 1. No acute intracranial findings.
2. Chronic microvascular angiopathy and parenchymal volume loss.
3. Intracranial atherosclerosis.

## 2019-10-14 IMAGING — MR MR HEAD W/O CM
8 series · 42 of 48 positions shown · non-contrast
Comparison: Head CT from earlier today

CLINICAL DATA: Generalized weakness.

EXAM:
MRI HEAD WITHOUT CONTRAST
TECHNIQUE: Multiplanar, multiecho pulse sequences of the brain and surrounding
structures were obtained without intravenous contrast.

[Series 4: T1 · sagittal · 5.0mm · 0.40mm/px · 3 of 20 slices shown]
[im 1/20]
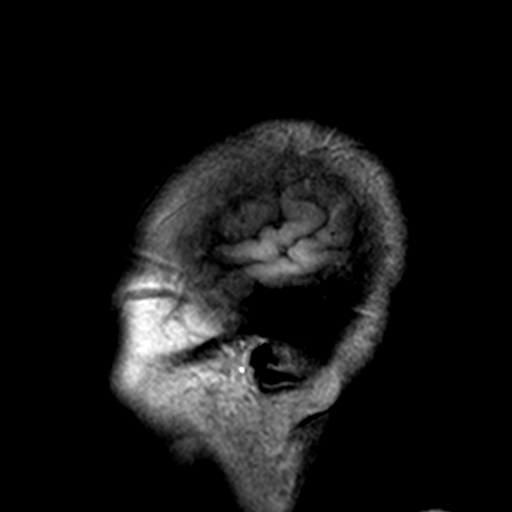
[im 10/20]
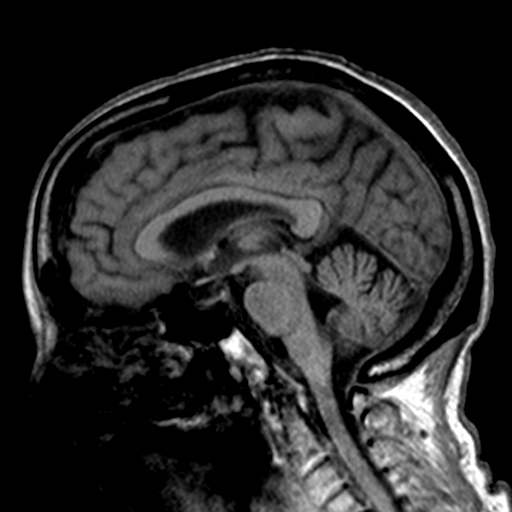
[im 20/20]
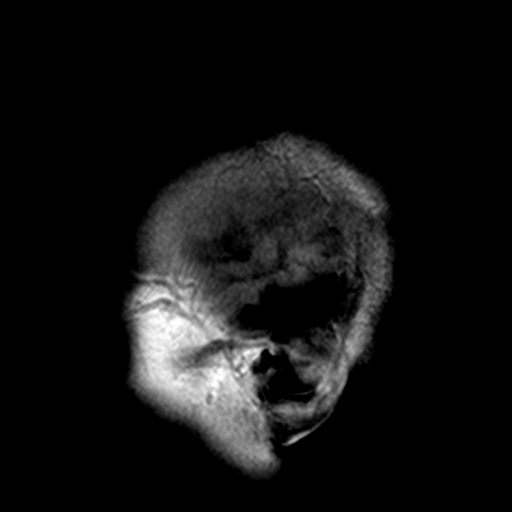

[Series 5: DWI · axial · 3.0mm · 0.82mm/px · z∈[-121,+38]mm · 9 of 56 slices shown (1 of 2)]
[im 1/56]
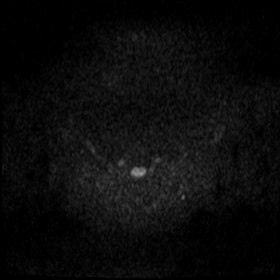
[im 7/56]
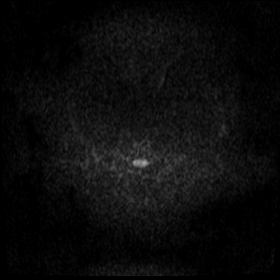
[im 14/56]
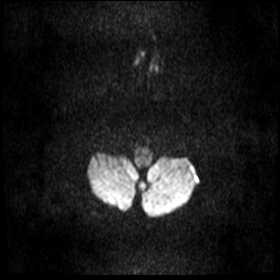
[im 21/56]
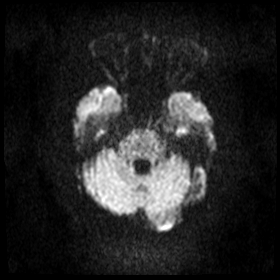
[im 28/56]
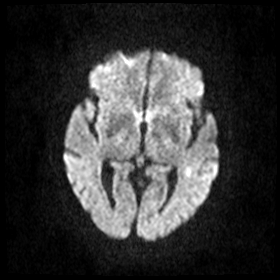
[im 35/56]
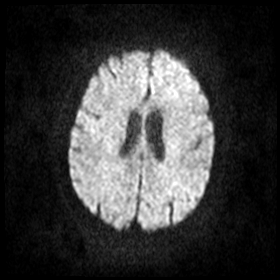
[im 42/56]
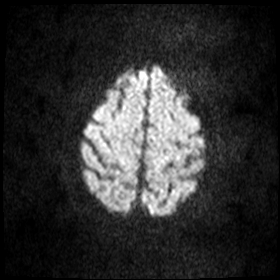
[im 49/56]
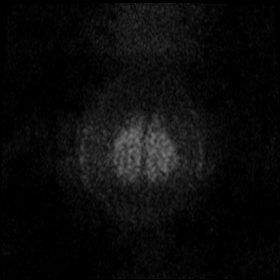
[im 56/56]
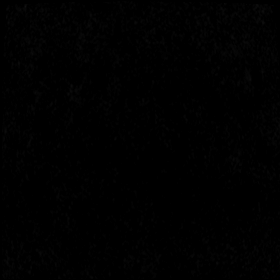

[Series 6: ax dwi_adc · axial · 3.0mm · 0.77mm/px · z∈[-119,+37]mm · 8 of 54 slices shown]
[im 1/54]
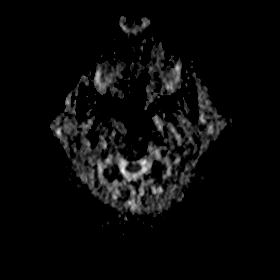
[im 7/54]
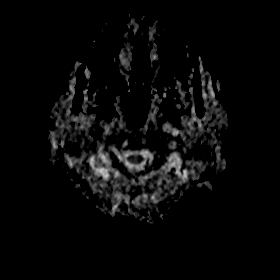
[im 14/54]
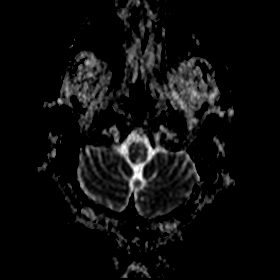
[im 20/54]
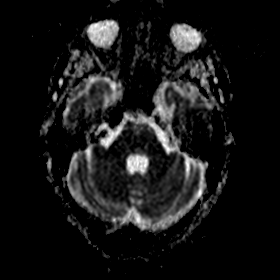
[im 34/54]
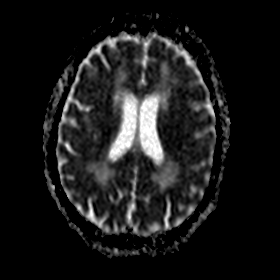
[im 40/54]
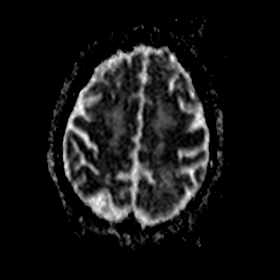
[im 47/54]
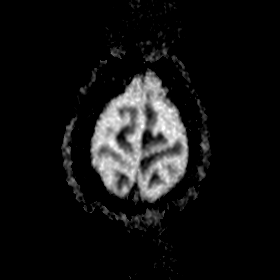
[im 54/54]
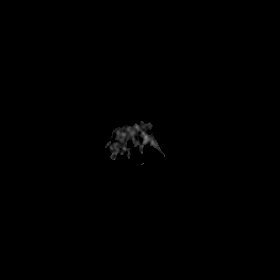

[Series 7: DWI · coronal · 5.0mm · 0.51mm/px · 6 of 35 slices shown (2 of 2)]
[im 1/35]
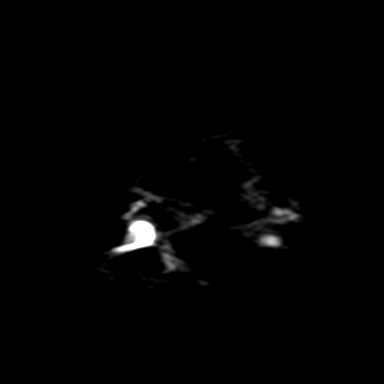
[im 7/35]
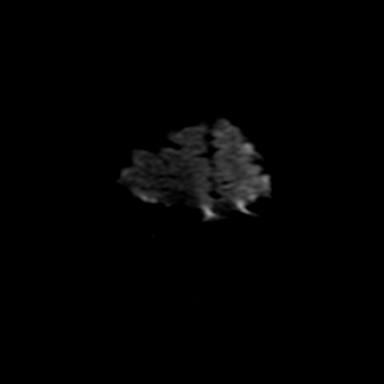
[im 14/35]
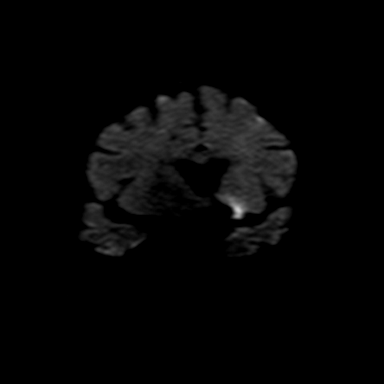
[im 21/35]
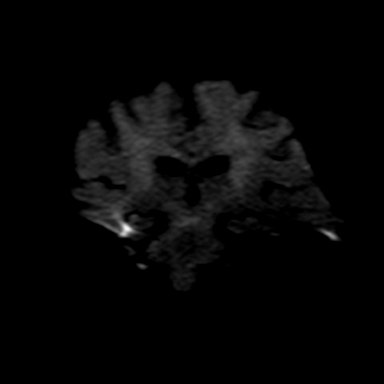
[im 28/35]
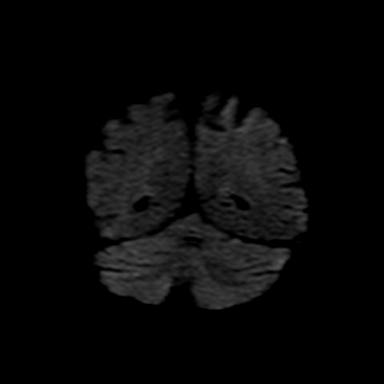
[im 35/35]
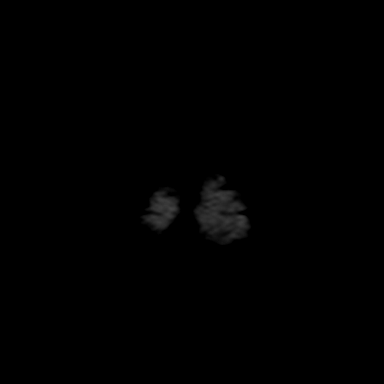

[Series 8: cor dwi_adc · coronal · 5.0mm · 0.51mm/px · 1 of 34 slices shown]
[im 1/34]
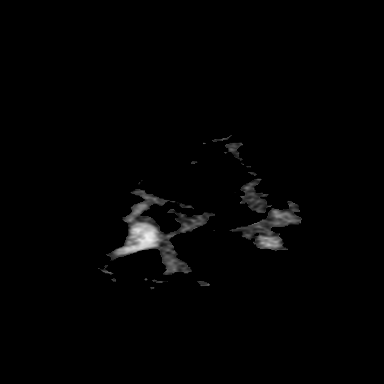

[Series 9: T2 · axial · 5.0mm · 0.65mm/px · z∈[-110,+31]mm · 4 of 23 slices shown (1 of 2)]
[im 1/23]
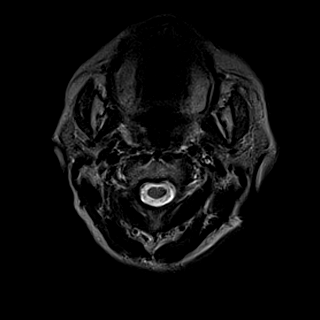
[im 8/23]
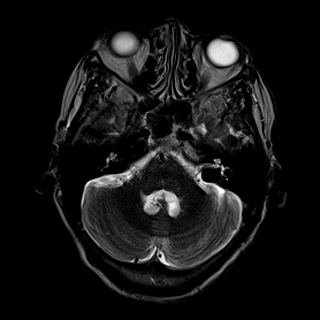
[im 15/23]
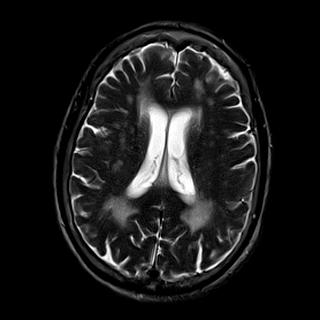
[im 23/23]
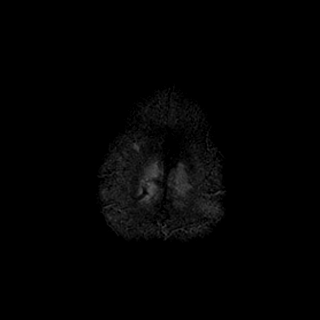

[Series 10: FLAIR · axial · 3.0mm · 0.90mm/px · z∈[-109,+26]mm · 8 of 47 slices shown]
[im 1/47]
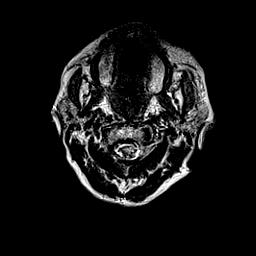
[im 7/47]
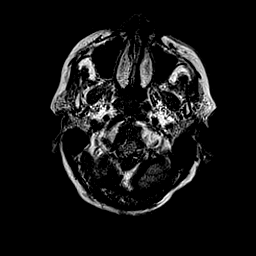
[im 14/47]
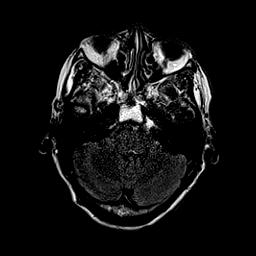
[im 20/47]
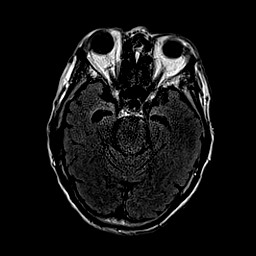
[im 27/47]
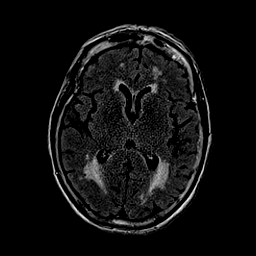
[im 33/47]
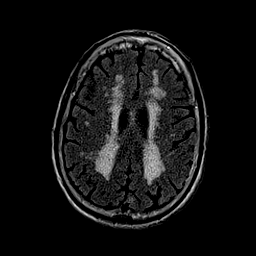
[im 40/47]
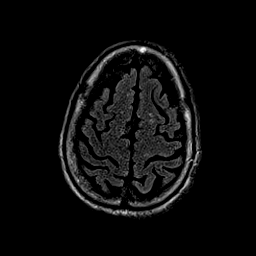
[im 47/47]
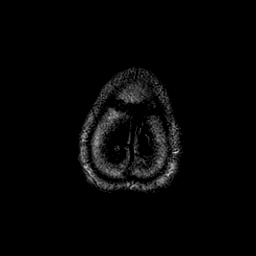

[Series 11: T2 · axial · 5.0mm · 0.43mm/px · z∈[-104,+23]mm · 3 of 21 slices shown (2 of 2)]
[im 1/21]
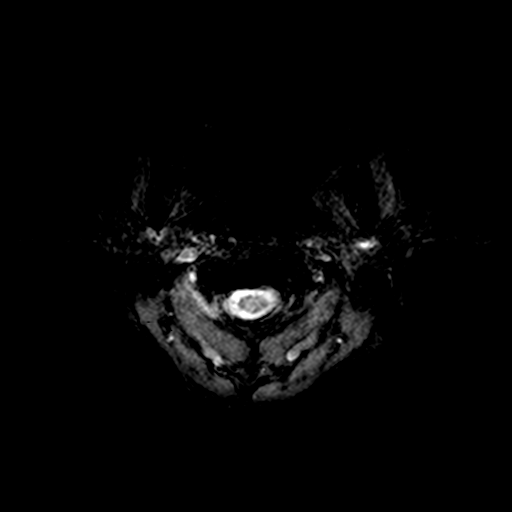
[im 11/21]
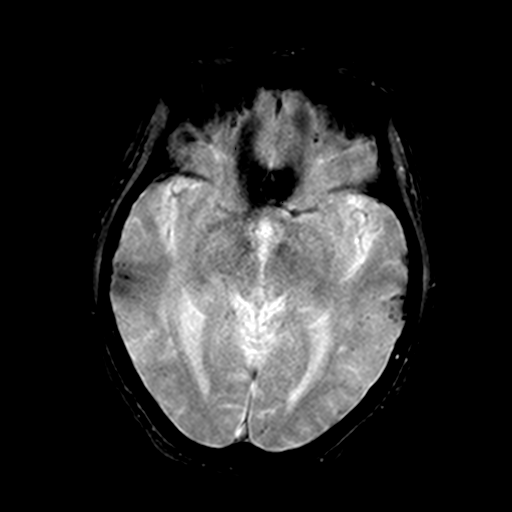
[im 21/21]
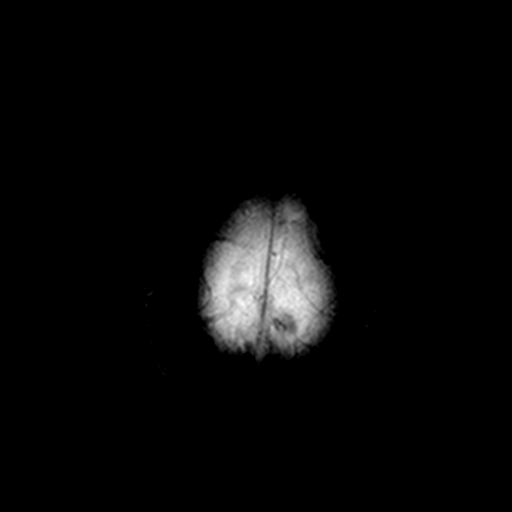

[42 of 48 positions shown; findings below may reference images not displayed]

FINDINGS: Brain: No acute infarction, hemorrhage, hydrocephalus, extra-axial
collection or mass lesion. Confluent FLAIR hyperintensity in the
deep cerebral white matter attributed to chronic small vessel
ischemia. Age normal brain volume. Scattered remote lobar
hemorrhages which are extensive. Less extensive remote cerebellar
hemispheric hemorrhages.

Vascular: Normal flow voids

Skull and upper cervical spine: Normal marrow signal

Sinuses/Orbits: Negative

Other: Incomplete study due to patient request, coronal T2 and axial
T1 weighted imaging was not acquired.
IMPRESSION: 1. Truncated study.  No acute finding, including infarct.
2. Findings of amyloid angiopathy with confluent chronic white
matter ischemia.

## 2019-10-14 MED ORDER — POTASSIUM CHLORIDE CRYS ER 20 MEQ PO TBCR
20.0000 meq | EXTENDED_RELEASE_TABLET | Freq: Once | ORAL | Status: AC
  Administered 2019-10-14: 20 meq via ORAL
  Filled 2019-10-14: qty 1

## 2019-10-14 MED ORDER — ATORVASTATIN CALCIUM 40 MG PO TABS
80.0000 mg | ORAL_TABLET | Freq: Every day | ORAL | Status: DC
  Administered 2019-10-14 – 2019-10-15 (×2): 80 mg via ORAL
  Filled 2019-10-14 (×2): qty 2

## 2019-10-14 MED ORDER — DONEPEZIL HCL 5 MG PO TABS
10.0000 mg | ORAL_TABLET | Freq: Every day | ORAL | Status: DC
  Administered 2019-10-14: 10 mg via ORAL
  Filled 2019-10-14: qty 2

## 2019-10-14 MED ORDER — ACETAMINOPHEN 325 MG PO TABS: 650.0000 mg | ORAL_TABLET | Freq: Four times a day (QID) | ORAL | Status: DC | PRN

## 2019-10-14 MED ORDER — ACETAMINOPHEN 650 MG RE SUPP: 650.0000 mg | Freq: Four times a day (QID) | RECTAL | Status: DC | PRN

## 2019-10-14 MED ORDER — SODIUM CHLORIDE 0.9 % IV BOLUS
1000.0000 mL | Freq: Once | INTRAVENOUS | Status: AC
  Administered 2019-10-14: 1000 mL via INTRAVENOUS

## 2019-10-14 MED ORDER — ONDANSETRON HCL 4 MG/2ML IJ SOLN: 4.0000 mg | Freq: Four times a day (QID) | INTRAMUSCULAR | Status: DC | PRN

## 2019-10-14 MED ORDER — ONDANSETRON HCL 4 MG PO TABS: 4.0000 mg | ORAL_TABLET | Freq: Four times a day (QID) | ORAL | Status: DC | PRN

## 2019-10-14 MED ORDER — NITROGLYCERIN 0.4 MG SL SUBL: 0.4000 mg | SUBLINGUAL_TABLET | SUBLINGUAL | Status: DC | PRN

## 2019-10-14 MED ORDER — HALOPERIDOL LACTATE 5 MG/ML IJ SOLN: 2.0000 mg | Freq: Once | INTRAMUSCULAR | Status: DC

## 2019-10-14 MED ORDER — ENOXAPARIN SODIUM 30 MG/0.3ML ~~LOC~~ SOLN
30.0000 mg | SUBCUTANEOUS | Status: DC
  Administered 2019-10-14 – 2019-10-15 (×2): 30 mg via SUBCUTANEOUS
  Filled 2019-10-14 (×2): qty 0.3

## 2019-10-14 NOTE — ED Notes (Signed)
Erin Jordan [phone]

## 2019-10-14 NOTE — ED Triage Notes (Signed)
Pt from home via [facility]. Family called [facility] and reported pt has had generalized weakness x 1 hour. Pt reports pain "all over."

## 2019-10-14 NOTE — Care Management Obs Status (Signed)
[facility] NOTIFICATION   Patient Details  Name: Erin Jordan MRN: [id number] Date of Birth: Apr 10, 1945   Medicare Observation Status Notification Given:  Yes    Tommy Medal 10/14/2019, 4:17 PM

## 2019-10-14 NOTE — Evaluation (Addendum)
Physical Therapy Evaluation Patient Details Name: Erin Jordan MRN: [id number] DOB: [birth year] Today's Date: 10/14/2019   History of Present Illness  Erin Jordan is a 74 y.o. female with medical history significant of osteoarthritis, renal cell carcinoma, renal hematoma, stage III CKD, CAD, history of NSTEMI, s/p CABG, essential hypertension, free monoclonal light chain, type 2 diabetes, dementia on donezepil, hyperlipidemia who is brought via EMS to the emergency department after the patient was noticed to be confused and generally weak by her granddaughter who was returning from work.  Her mental status has improved she is alert, oriented to self, but disoriented to place, time, date and situation.  She answers simple questions.  She denies headache, dyspnea, chest pain, abdominal or any other pain at this time.  She is otherwise unable to elaborate and provide further information.    Clinical Impression  Patient functioning at baseline for functional mobility and gait.  Patient demonstrates good return for ambulation on level, inclined/declined surfaces and stairs without loss of balance.  Plan:  Patient discharged from physical therapy to care of nursing for ambulation daily as tolerated for length of stay.     Follow Up Recommendations No PT follow up    Equipment Recommendations  None recommended by PT    Recommendations for Other Services       Precautions / Restrictions Precautions Precautions: None Restrictions Weight Bearing Restrictions: No      Mobility  Bed Mobility Overal bed mobility: Modified Independent                Transfers Overall transfer level: Modified independent                  Ambulation/Gait Ambulation/Gait assistance: Modified independent (Device/Increase time) Gait Distance (Feet): 200 Feet Assistive device: None Gait Pattern/deviations: WFL(Within Functional Limits) Gait velocity: decreased   General Gait Details:  demonstrates good return for ambulation on level, inclined and declined surfaces without loss of balance  Stairs Stairs: Yes Stairs assistance: Modified independent (Device/Increase time) Stair Management: One rail Right;Alternating pattern Number of Stairs: 10 General stair comments: demonstrates good return for going up/down stairs using 1 siderail without loss of balance  Wheelchair Mobility    Modified Rankin (Stroke Patients Only)       Balance Overall balance assessment: Modified Independent                                           Pertinent Vitals/Pain Pain Assessment: No/denies pain    Home Living Family/patient expects to be discharged to:: Private residence Living Arrangements: Children Available Help at Discharge: Family;Available PRN/intermittently Type of Home: Mobile home Home Access: Stairs to enter Entrance Stairs-Rails: Right;Left;Can reach both Entrance Stairs-Number of Steps: 5 Home Layout: One level Home Equipment: Walker - 2 wheels;Cane - single point;Shower seat;Bedside commode;Wheelchair - manual      Prior Function Level of Independence: Independent         Comments: household and short distanced Estate agent Dominance   Dominant Hand: Right    Extremity/Trunk Assessment   Upper Extremity Assessment Upper Extremity Assessment: Overall WFL for tasks assessed    Lower Extremity Assessment Lower Extremity Assessment: Overall WFL for tasks assessed    Cervical / Trunk Assessment Cervical / Trunk Assessment: Normal  Communication   Communication: No difficulties  Cognition Arousal/Alertness: Awake/alert Behavior During Therapy: [facility]  for tasks assessed/performed Overall Cognitive Status: Within Functional Limits for tasks assessed                                        General Comments      Exercises     Assessment/Plan    PT Assessment Patent does not need any further PT  services  PT Problem List         PT Treatment Interventions      PT Goals (Current goals can be found in the Care Plan section)  Acute Rehab PT Goals Patient Stated Goal: return home with family to assist PT Goal Formulation: With patient/family Time For Goal Achievement: 10/14/19 Potential to Achieve Goals: Good    Frequency     Barriers to discharge        Co-evaluation               AM-PAC PT "6 Clicks" Mobility  Outcome Measure Help needed turning from your back to your side while in a flat bed without using bedrails?: None Help needed moving from lying on your back to sitting on the side of a flat bed without using bedrails?: None Help needed moving to and from a bed to a chair (including a wheelchair)?: None Help needed standing up from a chair using your arms (e.g., wheelchair or bedside chair)?: None Help needed to walk in hospital room?: None Help needed climbing 3-5 steps with a railing? : None 6 Click Score: 24    End of Session   Activity Tolerance: Patient tolerated treatment well Patient left: in chair;with call bell/phone within reach;with family/visitor present Nurse Communication: Mobility status PT Visit Diagnosis: Unsteadiness on feet (R26.81);Other abnormalities of gait and mobility (R26.89);Muscle weakness (generalized) (M62.81)    Time: 3220-2542 PT Time Calculation (min) (ACUTE ONLY): 25 min   Charges:   PT Evaluation $PT Eval Moderate Complexity: 1 Mod PT Treatments $Therapeutic Activity: 23-37 mins        3:11 PM, 10/14/19 Lonell Grandchild, MPT Physical Therapist with [facility] [phone] office [phone] mobile phone

## 2019-10-14 NOTE — H&P (Signed)
History and Physical    Erin Jordan XBM:[id number] DOB: 1945/06/16 DOA: 10/14/2019  PCP: [name], FNP   Patient coming from: Home.  I have personally briefly reviewed patient's old medical records in [place]  Chief Complaint: Weakness and confusion.  HPI: Erin Jordan is a 74 y.o. female with medical history significant of osteoarthritis, renal cell carcinoma, renal hematoma, stage III CKD, CAD, history of NSTEMI, s/p CABG, essential hypertension, free monoclonal light chain, type 2 diabetes, dementia on donezepil, hyperlipidemia who is brought via EMS to the emergency department after the patient was noticed to be confused and generally weak by her granddaughter who was returning from work.  Her mental status has improved she is alert, oriented to self, but disoriented to place, time, date and situation.  She answers simple questions.  She denies headache, dyspnea, chest pain, abdominal or any other pain at this time.  She is otherwise unable to elaborate and provide further information.  ED Course: Initial vital signs were temperature 97.8 F, pulse 70, respiration 18, blood pressure 185/73 mmHg and O2 sat 100% on room air.  The patient received a 1000 mL NS bolus in the ED.  Urinalysis shows proteinuria of 100 mg/dL, but is otherwise unremarkable.  CBC is normal.  Lipase was sixty-eight.  Troponin was 6 and then 8 ng/L.  Acetaminophen, salicylate and alcohol level are within normal limits.  CMP shows a potassium of 3.4 mmol/L, all other electrolytes are within normal range.  Glucose 143, BUN 33 and creatinine 1.86 mg/dL.  Hepatic functions are within normal range.  Imaging: A one view chest radiograph did not show any acute abnormality.  CT head without contrast showed no acute intracranial findings.  There is chronic microvascular angiopathy and parenchymal volume loss.  There is also intracranial atherosclerosis.  Please see images and full radiology report for further  detail.  Review of Systems: As per HPI otherwise all other systems reviewed and are negative.  Past Medical History:  Diagnosis Date  . Arthritis   . Cancer of kidney ([place])   . Chronic kidney disease, stage 3, mod decreased GFR   . Coronary atherosclerosis of native coronary artery 2006   Multivessel status post CABG in [place], graft disease documented March 2019 with DES to SVG to diagonal  . Essential hypertension   . 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 History of diabetes mellitus, type II   . Hyperlipidemia   . NSTEMI (non-ST elevated myocardial infarction) ([place]) 06/24/2017  . Renal hematoma   . Right renal mass     Past Surgical History:  Procedure Laterality Date  . ABDOMINAL HYSTERECTOMY    . CORONARY ARTERY BYPASS GRAFT  2006   [place], [place]  . CORONARY STENT INTERVENTION N/A 06/25/2017   Procedure: CORONARY STENT INTERVENTION;  Surgeon: Jettie Booze, MD;  Location: [facility];  Service: Cardiovascular;  Laterality: N/A;  . IR RADIOLOGIST EVAL & MGMT  07/08/2019  . LEFT HEART CATH AND CORS/GRAFTS ANGIOGRAPHY N/A 06/25/2017   Procedure: LEFT HEART CATH AND CORS/GRAFTS ANGIOGRAPHY;  Surgeon: Jettie Booze, MD;  Location: [facility];  Service: Cardiovascular;  Laterality: N/A;    Social History  reports that she has never smoked. She has never used smokeless tobacco. She reports that she does not drink alcohol and does not use drugs.  No Known Allergies  Family History  Problem Relation Age of Onset  . Heart attack Mother   . Hypertension  Mother   . Seizures Son   . Seizures Son   . Seizures Daughter    Prior to Admission medications   Medication Sig Start Date End Date Taking? Authorizing Provider  amLODipine (NORVASC) 5 MG tablet Take 1 tablet (5 mg total) by mouth daily. 08/19/19   [name], FNP  atorvastatin (LIPITOR) 80 MG tablet Take 1 tablet (80 mg total) by mouth daily. 08/19/19   [name], FNP    calcium carbonate (OSCAL) 1500 (600 Ca) MG TABS tablet Take 1,500 mg by mouth daily with breakfast.     [provider]  cholecalciferol (VITAMIN D) 25 MCG (1000 UT) tablet Take 1,000 Units by mouth daily.     [provider]  donepezil (ARICEPT) 10 MG tablet Take 1 tablet (10 mg total) by mouth at bedtime. 03/24/19   [name], FNP  hydrocortisone (ANUSOL-HC) 25 MG suppository Place 1 suppository (25 mg total) rectally daily as needed for hemorrhoids or anal itching. Patient not taking: Reported on 09/20/2019 08/10/19   Nuala Alpha A, PA-C  methimazole (TAPAZOLE) 5 MG tablet TAKE ONE TABLET BY MOUTH DAILY. Patient taking differently: Take 5 mg by mouth daily.  08/02/19   [name], FNP  metoprolol succinate (TOPROL XL) 25 MG 24 hr tablet Take 1 tablet (25 mg total) by mouth daily. 06/24/19   Strader, Fransisco Hertz, PA-C  nitroGLYCERIN (NITROSTAT) 0.4 MG SL tablet Place 1 tablet (0.4 mg total) under the tongue every 5 (five) minutes x 3 doses as needed for chest pain. Patient not taking: Reported on 09/20/2019 07/07/19   Satira Sark, MD   Physical Exam: Vitals:   10/14/19 0100 10/14/19 0230 10/14/19 0300 10/14/19 0330  BP: (!) 177/66 (!) 173/91 (!) 183/85 (!) 153/80  Pulse: 61 80 72 67  Resp: 18 13 15 11   Temp:      TempSrc:      SpO2: 100% 100% 100% 100%   Constitutional: NAD, calm, comfortable Eyes: PERRL, lids and conjunctivae normal ENMT: Mucous membranes are moist. Posterior pharynx clear of any exudate or lesions. Neck: normal, supple, no masses, no thyromegaly Respiratory: Decreased breath sounds in bases, otherwise clear to auscultation bilaterally, no wheezing, no crackles. Normal respiratory effort. No accessory muscle use.  Cardiovascular: Regular rate and rhythm, no murmurs / rubs / gallops. No extremity edema. 2+ pedal pulses. No carotid bruits.  Abdomen: Nondistended.  BS positive.  Soft, no tenderness, no masses palpated. No  hepatosplenomegaly. Musculoskeletal: Generalized weakness.  No clubbing / cyanosis.  Good ROM, no contractures. Normal muscle tone.  Skin: no rashes, lesions, ulcers on limited dermatological examination. Neurologic: CN 2-12 grossly intact. Sensation intact, DTR normal.  Grossly nonfocal..  Psychiatric: Normal judgment and insight. Alert and oriented x 3. Normal mood.   Labs on Admission: I have personally reviewed following labs and imaging studies  CBC: Recent Labs  Lab 10/11/19 0150 10/14/19 0102  WBC 5.7 6.8  NEUTROABS 3.8 4.6  HGB 11.7* 12.7  HCT 35.6* 39.3  MCV 94.4 95.4  PLT 190 132    Basic Metabolic Panel: Recent Labs  Lab 10/11/19 0150 10/14/19 0102  NA 138 139  K 3.2* 3.4*  CL 104 104  CO2 24 22  GLUCOSE 165* 143*  BUN 30* 33*  CREATININE 1.64* 1.86*  CALCIUM 9.1 9.4    GFR: Estimated Creatinine Clearance: 20.7 mL/min (A) (by C-G formula based on SCr of 1.86 mg/dL (H)).  Liver Function Tests: Recent Labs  Lab 10/11/19  0150 10/14/19 0102  AST 16 16  ALT 11 12  ALKPHOS 90 95  BILITOT 0.7 0.6  PROT 7.3 7.2  ALBUMIN 4.1 4.2    Urine analysis:    Component Value Date/Time   COLORURINE STRAW (A) 10/14/2019 0233   APPEARANCEUR CLEAR 10/14/2019 0233   LABSPEC 1.009 10/14/2019 0233   PHURINE 6.0 10/14/2019 0233   GLUCOSEU NEGATIVE 10/14/2019 0233   HGBUR NEGATIVE 10/14/2019 0233   BILIRUBINUR NEGATIVE 10/14/2019 0233   BILIRUBINUR neg 05/19/2019 0947   KETONESUR NEGATIVE 10/14/2019 0233   PROTEINUR 100 (A) 10/14/2019 0233   UROBILINOGEN 0.2 05/19/2019 0947   UROBILINOGEN 0.2 06/01/2011 1822   NITRITE NEGATIVE 10/14/2019 0233   LEUKOCYTESUR NEGATIVE 10/14/2019 0233    Radiological Exams on Admission: CT Head Wo Contrast  Result Date: 10/14/2019 CLINICAL DATA:  Encephalopathy, generalized weakness for 1 hour EXAM: CT HEAD WITHOUT CONTRAST TECHNIQUE: Contiguous axial images were obtained from the base of the skull through the vertex without  intravenous contrast. COMPARISON:  CT 04/23/2017 FINDINGS: Brain: No evidence of acute infarction, hemorrhage, hydrocephalus, extra-axial collection or mass lesion/mass effect. Symmetric prominence of the ventricles, cisterns and sulci compatible with parenchymal volume loss. Patchy areas of white matter hypoattenuation are most compatible with chronic microvascular angiopathy. Vascular: Atherosclerotic calcification of the carotid siphons and intradural right vertebral artery. No worrisome hyperdense vessel. Skull: Several metallic hair pins are noted along the right scalp. No significant scalp swelling or hematoma. No calvarial fracture or worrisome osseous lesion. Mild benign hyperostosis frontalis interna. Sinuses/Orbits: Paranasal sinuses and mastoid air cells are predominantly clear. Included orbital structures are unremarkable. Other: None IMPRESSION: 1. No acute intracranial findings. 2. Chronic microvascular angiopathy and parenchymal volume loss. 3. Intracranial atherosclerosis. Electronically Signed   By: Lovena Le M.D.   On: 10/14/2019 03:11   DG Chest Port 1 View  Result Date: 10/14/2019 CLINICAL DATA:  Generalized weakness and pain EXAM: PORTABLE CHEST 1 VIEW COMPARISON:  04/24/2018 FINDINGS: Cardiac shadow stable in appearance. Postsurgical changes are again noted. The lungs are clear bilaterally. No focal infiltrate or effusion is seen. No bony abnormality is noted. IMPRESSION: No acute abnormality noted. Electronically Signed   By: Inez Catalina M.D.   On: 10/14/2019 02:28    EKG: Independently reviewed.  Vent. rate 66 BPM PR interval * ms QRS duration 107 ms QT/QTc 453/475 ms P-R-T axes 70 29 11 Sinus rhythm Borderline repolarization abnormality.  Assessment/Plan Principal Problem:   AMS (altered mental status) (Acute metabolic encephalopathy) No obvious etiology at the moment. Observation/telemetry. Frequent neuro checks. Check MRI of the brain. Further work-up depending  MRI findings.  Active Problems:   Hypokalemia K-Dur 20 mEq p.o. x1. Follow-up potassium level.    Dyslipidemia, goal LDL below 70 Continue atorvastatin after med rec performed.    Essential hypertension Hold antihypertensives until MRI performed. Monitor blood pressure.    Stage 3 chronic kidney disease Monitor renal function electrolytes.    CAD -S/P PCI 06/26/17 On atorvastatin and metoprolol. Not sure the patient is taking aspirin.    Hyperthyroidism Tapazole 5 mg p.o. daily to be reconciled first.    Dementia without behavioral disturbance (HCC) Continue donezepil 10 mg p.o. bedtime.    DVT prophylaxis: Lovenox SQ. Code Status:   Full code. Family Communication:  Her Granddaughter was present in the ED room. Disposition Plan:   Patient is from:  Home.  Anticipated DC to:  Home.  Anticipated DC date:  10/15/2019.  Anticipated DC barriers: Clinical improvement/brain MRI.  Consults called: Admission status:  Observation/telemetry.  Severity of Illness:  High.  Reubin Milan MD [facility]  How to contact the [facility] Attending or Consulting provider [place] or covering provider during after hours [place], for this patient?   1. Check the care team in [facility] and look for a) attending/consulting TRH provider listed and b) the [facility] team listed 2. Log into [email or web address] and use [place]'s universal password to access. If you do not have the password, please contact the hospital operator. 3. Locate the [facility] provider you are looking for under [facility] and page to a number that you can be directly reached. 4. If you still have difficulty reaching the provider, please page the [facility] (Director on Call) for the Hospitalists listed on amion for assistance.  10/14/2019, 5:35 AM   This document was prepared using Dragon voice recognition software and may contain some unintended transcription errors.

## 2019-10-14 NOTE — Progress Notes (Signed)
ASSUMPTION OF CARE NOTE   10/14/2019 3:54 PM  Erin Jordan was seen and examined.  The H&P by the admitting provider, orders, imaging was reviewed.  Please see new orders.  Will continue to follow.   Vitals:   10/14/19 0654 10/14/19 1407  BP: (!) 132/111 (!) 150/74  Pulse: 90 84  Resp: 16 19  Temp:  98.9 F (37.2 C)  SpO2: 100% 100%    Results for orders placed or performed during the hospital encounter of 10/14/19  SARS Coronavirus 2 by RT PCR (hospital order, performed in [place] hospital lab) Nasopharyngeal Nasopharyngeal Swab   Specimen: Nasopharyngeal Swab  Result Value Ref Range   SARS Coronavirus 2 NEGATIVE NEGATIVE  CBC with Differential/Platelet  Result Value Ref Range   WBC 6.8 4.0 - 10.5 K/uL   RBC 4.12 3.87 - 5.11 MIL/uL   Hemoglobin 12.7 12.0 - 15.0 g/dL   HCT 39.3 36 - 46 %   MCV 95.4 80.0 - 100.0 fL   MCH 30.8 26.0 - 34.0 pg   MCHC 32.3 30.0 - 36.0 g/dL   RDW 12.4 11.5 - 15.5 %   Platelets 208 150 - 400 K/uL   nRBC 0.0 0.0 - 0.2 %   Neutrophils Relative % 67 %   Neutro Abs 4.6 1.7 - 7.7 K/uL   Lymphocytes Relative 23 %   Lymphs Abs 1.6 0.7 - 4.0 K/uL   Monocytes Relative 7 %   Monocytes Absolute 0.5 0 - 1 K/uL   Eosinophils Relative 2 %   Eosinophils Absolute 0.1 0 - 0 K/uL   Basophils Relative 1 %   Basophils Absolute 0.0 0 - 0 K/uL   Immature Granulocytes 0 %   Abs Immature Granulocytes 0.01 0.00 - 0.07 K/uL  Comprehensive metabolic panel  Result Value Ref Range   Sodium 139 135 - 145 mmol/L   Potassium 3.4 (L) 3.5 - 5.1 mmol/L   Chloride 104 98 - 111 mmol/L   CO2 22 22 - 32 mmol/L   Glucose, Bld 143 (H) 70 - 99 mg/dL   BUN 33 (H) 8 - 23 mg/dL   Creatinine, Ser 1.86 (H) 0.44 - 1.00 mg/dL   Calcium 9.4 8.9 - 10.3 mg/dL   Total Protein 7.2 6.5 - 8.1 g/dL   Albumin 4.2 3.5 - 5.0 g/dL   AST 16 15 - 41 U/L   ALT 12 0 - 44 U/L   Alkaline Phosphatase 95 38 - 126 U/L   Total Bilirubin 0.6 0.3 - 1.2 mg/dL   GFR calc non Af Amer 26 (L) >60  mL/min   GFR calc Af Amer 30 (L) >60 mL/min   Anion gap 13 5 - 15  Lipase, blood  Result Value Ref Range   Lipase 68 (H) 11 - 51 U/L  Urinalysis, Routine w reflex microscopic  Result Value Ref Range   Color, Urine STRAW (A) YELLOW   APPearance CLEAR CLEAR   Specific Gravity, Urine 1.009 1.005 - 1.030   pH 6.0 5.0 - 8.0   Glucose, UA NEGATIVE NEGATIVE mg/dL   Hgb urine dipstick NEGATIVE NEGATIVE   Bilirubin Urine NEGATIVE NEGATIVE   Ketones, ur NEGATIVE NEGATIVE mg/dL   Protein, ur 100 (A) NEGATIVE mg/dL   Nitrite NEGATIVE NEGATIVE   Leukocytes,Ua NEGATIVE NEGATIVE   WBC, UA 0-5 0 - 5 WBC/hpf   Bacteria, UA NONE SEEN NONE SEEN   Squamous Epithelial / LPF 0-5 0 - 5  Acetaminophen level  Result Value Ref Range  Acetaminophen (Tylenol), Serum <10 (L) 10 - 30 ug/mL  Salicylate level  Result Value Ref Range   Salicylate Lvl <9.4 (L) 7.0 - 30.0 mg/dL  Ethanol  Result Value Ref Range   Alcohol, Ethyl (B) <10 <10 mg/dL  Magnesium  Result Value Ref Range   Magnesium 2.2 1.7 - 2.4 mg/dL  Glucose, capillary  Result Value Ref Range   Glucose-Capillary 144 (H) 70 - 99 mg/dL  Glucose, capillary  Result Value Ref Range   Glucose-Capillary 105 (H) 70 - 99 mg/dL  Troponin I (High Sensitivity)  Result Value Ref Range   Troponin I (High Sensitivity) 6 <18 ng/L  Troponin I (High Sensitivity)  Result Value Ref Range   Troponin I (High Sensitivity) 8 <18 ng/L    C. Wynetta Emery, MD [facility]   10/14/2019 12:30 AM How to contact the [facility] Attending or Consulting provider [place] or covering provider during after hours [place], for this patient?  1. Check the care team in [facility] and look for a) attending/consulting TRH provider listed and b) the [facility] team listed 2. Log into [email or web address] and use [place]'s universal password to access. If you do not have the password, please contact the hospital operator. 3. Locate the [facility] provider you are looking for under [facility] and page to  a number that you can be directly reached. 4. If you still have difficulty reaching the provider, please page the [facility] (Director on Call) for the Hospitalists listed on amion for assistance.

## 2019-10-14 NOTE — ED Provider Notes (Signed)
[facility] Provider Note   CSN: [id number] Arrival date & time: 10/14/19  0030     History Chief Complaint  Patient presents with  . Weakness    Erin Jordan is a 74 y.o. female.  Level 5 caveat for altered mental status.  Patient here with family with confusion, slow to respond, difficulty speaking difficulty raising her arms up".  Granddaughter states when she came home from work the patient is not acting like herself and seemed very slow to respond.  She appeared to be slurring her words and was not able to lift up her arms when she was asked.  This seems to have improved over the past 1 hour.  Patient is awake denies any complaints.  She is oriented to person and place.  No focal weakness, numbness or tingling.  No chest pain or shortness of breath.  No pain with urination or blood in the urine.  No fever.  Recent ED visit 2 days ago for abdominal pain with reassuring work-up.  The history is provided by the patient.  Weakness Associated symptoms: no abdominal pain, no arthralgias, no cough, no dizziness, no dysuria, no fever, no myalgias, no nausea, no shortness of breath and no vomiting        Past Medical History:  Diagnosis Date  . Arthritis   . Cancer of kidney ([place])   . Chronic kidney disease, stage 3, mod decreased GFR   . Coronary atherosclerosis of native coronary artery 2006   Multivessel status post CABG in [place], graft disease documented March 2019 with DES to SVG to diagonal  . Essential hypertension   . 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 History of diabetes mellitus, type II   . Hyperlipidemia   . NSTEMI (non-ST elevated myocardial infarction) ([place]) 06/24/2017  . Renal hematoma   . Right renal mass     Patient Active Problem List   Diagnosis Date Noted  . ARF (acute renal failure) ([place]) 08/02/2019  . Renal cell carcinoma ([place]) 08/02/2019  . Postoperative surgical complication involving genitourinary system  associated with genitourinary procedure 08/02/2019  . Acute blood loss anemia 08/02/2019  . Renal hematoma 08/02/2019  . RLS (restless legs syndrome) 01/25/2019  . Right renal mass 01/25/2019  . Splenic mass 01/25/2019  . Blood in stool 01/25/2019  . Dementia without behavioral disturbance ([place]) 01/25/2019  . Hyperthyroidism 09/14/2018  . Osteopenia 05/20/2018  . Multinodular goiter 10/14/2017  . Weight loss, unintentional 07/14/2017  . CAD -S/P PCI 06/26/17 06/27/2017  . 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 Hx of CABG- 2006   . History of diabetes mellitus   . Essential hypertension   . Stage 3 chronic kidney disease   . Hypokalemia 06/02/2011  . Dyslipidemia, goal LDL below 70 06/02/2011    Past Surgical History:  Procedure Laterality Date  . ABDOMINAL HYSTERECTOMY    . CORONARY ARTERY BYPASS GRAFT  2006   [place], [place]  . CORONARY STENT INTERVENTION N/A 06/25/2017   Procedure: CORONARY STENT INTERVENTION;  Surgeon: Jettie Booze, MD;  Location: [facility];  Service: Cardiovascular;  Laterality: N/A;  . IR RADIOLOGIST EVAL & MGMT  07/08/2019  . LEFT HEART CATH AND CORS/GRAFTS ANGIOGRAPHY N/A 06/25/2017   Procedure: LEFT HEART CATH AND CORS/GRAFTS ANGIOGRAPHY;  Surgeon: Jettie Booze, MD;  Location: [facility];  Service: Cardiovascular;  Laterality: N/A;     OB History    Gravida      Para  Term      Preterm      AB      Living  6     SAB      TAB      Ectopic      Multiple      Live Births              Family History  Problem Relation Age of Onset  . Heart attack Mother   . Hypertension Mother   . Seizures Son   . Seizures Son   . Seizures Daughter     Social History   Tobacco Use  . Smoking status: Never Smoker  . Smokeless tobacco: Never Used  Vaping Use  . Vaping Use: Never used  Substance Use Topics  . Alcohol use: No  . Drug use: No    Home Medications Prior to Admission medications     Medication Sig Start Date End Date Taking? Authorizing Provider  amLODipine (NORVASC) 5 MG tablet Take 1 tablet (5 mg total) by mouth daily. 08/19/19   [name], FNP  atorvastatin (LIPITOR) 80 MG tablet Take 1 tablet (80 mg total) by mouth daily. 08/19/19   [name], FNP  calcium carbonate (OSCAL) 1500 (600 Ca) MG TABS tablet Take 1,500 mg by mouth daily with breakfast.     [provider]  cholecalciferol (VITAMIN D) 25 MCG (1000 UT) tablet Take 1,000 Units by mouth daily.     [provider]  donepezil (ARICEPT) 10 MG tablet Take 1 tablet (10 mg total) by mouth at bedtime. 03/24/19   [name], FNP  hydrocortisone (ANUSOL-HC) 25 MG suppository Place 1 suppository (25 mg total) rectally daily as needed for hemorrhoids or anal itching. Patient not taking: Reported on 09/20/2019 08/10/19   Nuala Alpha A, PA-C  methimazole (TAPAZOLE) 5 MG tablet TAKE ONE TABLET BY MOUTH DAILY. Patient taking differently: Take 5 mg by mouth daily.  08/02/19   [name], FNP  metoprolol succinate (TOPROL XL) 25 MG 24 hr tablet Take 1 tablet (25 mg total) by mouth daily. 06/24/19   Strader, Fransisco Hertz, PA-C  nitroGLYCERIN (NITROSTAT) 0.4 MG SL tablet Place 1 tablet (0.4 mg total) under the tongue every 5 (five) minutes x 3 doses as needed for chest pain. Patient not taking: Reported on 09/20/2019 07/07/19   Satira Sark, MD    Allergies    Patient has no known allergies.  Review of Systems   Review of Systems  Constitutional: Positive for activity change. Negative for appetite change and fever.  HENT: Negative for congestion.   Eyes: Negative for visual disturbance.  Respiratory: Negative for cough, chest tightness and shortness of breath.   Gastrointestinal: Negative for abdominal pain, nausea and vomiting.  Genitourinary: Negative for dysuria, hematuria, vaginal bleeding and vaginal discharge.  Musculoskeletal: Negative for arthralgias and myalgias.  Skin:  Negative for rash.  Neurological: Positive for speech difficulty, weakness and light-headedness. Negative for dizziness.   all other systems are negative except as noted in the HPI and PMH.    Physical Exam Updated Vital Signs BP (!) 185/73   Pulse 70   Temp 97.8 F (36.6 C) (Oral)   SpO2 100%   Physical Exam Vitals and nursing note reviewed.  Constitutional:      General: She is not in acute distress.    Appearance: Normal appearance. She is well-developed and normal weight. She is not ill-appearing.  HENT:     Head: Normocephalic and  atraumatic.     Mouth/Throat:     Pharynx: No oropharyngeal exudate.  Eyes:     Conjunctiva/sclera: Conjunctivae normal.     Pupils: Pupils are equal, round, and reactive to light.  Neck:     Comments: No meningismus. Cardiovascular:     Rate and Rhythm: Normal rate and regular rhythm.     Heart sounds: Normal heart sounds. No murmur heard.   Pulmonary:     Effort: Pulmonary effort is normal. No respiratory distress.     Breath sounds: Normal breath sounds.  Chest:     Chest wall: No tenderness.  Abdominal:     Palpations: Abdomen is soft.     Tenderness: There is no abdominal tenderness. There is no guarding or rebound.  Musculoskeletal:        General: No tenderness. Normal range of motion.     Cervical back: Normal range of motion and neck supple.  Skin:    General: Skin is warm.  Neurological:     Mental Status: She is alert.     Cranial Nerves: No cranial nerve deficit.     Motor: No abnormal muscle tone.     Coordination: Coordination normal.     Comments:  5/5 strength throughout. CN 2-12 intact.Equal grip strength.  Oriented to person and place No pronator drift.  No ataxia on finger-to-nose, cranial nerves II to XII intact, tongue is midline  Psychiatric:        Behavior: Behavior normal.     ED Results / Procedures / Treatments   Labs (all labs ordered are listed, but only abnormal results are displayed) Labs  Reviewed  COMPREHENSIVE METABOLIC PANEL - Abnormal; Notable for the following components:      Result Value   Potassium 3.4 (*)    Glucose, Bld 143 (*)    BUN 33 (*)    Creatinine, Ser 1.86 (*)    GFR calc non Af Amer 26 (*)    GFR calc Af Amer 30 (*)    All other components within normal limits  LIPASE, BLOOD - Abnormal; Notable for the following components:   Lipase 68 (*)    All other components within normal limits  URINALYSIS, ROUTINE W REFLEX MICROSCOPIC - Abnormal; Notable for the following components:   Color, Urine STRAW (*)    Protein, ur 100 (*)    All other components within normal limits  ACETAMINOPHEN LEVEL - Abnormal; Notable for the following components:   Acetaminophen (Tylenol), Serum <10 (*)    All other components within normal limits  SALICYLATE LEVEL - Abnormal; Notable for the following components:   Salicylate Lvl <8.4 (*)    All other components within normal limits  URINE CULTURE  SARS CORONAVIRUS 2 BY RT PCR (HOSPITAL ORDER, [facility])  CBC WITH DIFFERENTIAL/PLATELET  ETHANOL  TROPONIN I (HIGH SENSITIVITY)  TROPONIN I (HIGH SENSITIVITY)    EKG EKG Interpretation  Date/Time:  Thursday October 14 2019 01:52:31 EDT Ventricular Rate:  66 PR Interval:    QRS Duration: 107 QT Interval:  453 QTC Calculation: 475 R Axis:   29 Text Interpretation: Sinus rhythm Borderline repolarization abnormality No significant change was found Confirmed by Ezequiel Essex [phone]) on 10/14/2019 2:28:31 AM   Radiology CT Head Wo Contrast  Result Date: 10/14/2019 CLINICAL DATA:  Encephalopathy, generalized weakness for 1 hour EXAM: CT HEAD WITHOUT CONTRAST TECHNIQUE: Contiguous axial images were obtained from the base of the skull through the vertex without intravenous contrast. COMPARISON:  CT 04/23/2017 FINDINGS: Brain: No evidence of acute infarction, hemorrhage, hydrocephalus, extra-axial collection or mass lesion/mass effect. Symmetric  prominence of the ventricles, cisterns and sulci compatible with parenchymal volume loss. Patchy areas of white matter hypoattenuation are most compatible with chronic microvascular angiopathy. Vascular: Atherosclerotic calcification of the carotid siphons and intradural right vertebral artery. No worrisome hyperdense vessel. Skull: Several metallic hair pins are noted along the right scalp. No significant scalp swelling or hematoma. No calvarial fracture or worrisome osseous lesion. Mild benign hyperostosis frontalis interna. Sinuses/Orbits: Paranasal sinuses and mastoid air cells are predominantly clear. Included orbital structures are unremarkable. Other: None IMPRESSION: 1. No acute intracranial findings. 2. Chronic microvascular angiopathy and parenchymal volume loss. 3. Intracranial atherosclerosis. Electronically Signed   By: Lovena Le M.D.   On: 10/14/2019 03:11   DG Chest Port 1 View  Result Date: 10/14/2019 CLINICAL DATA:  Generalized weakness and pain EXAM: PORTABLE CHEST 1 VIEW COMPARISON:  04/24/2018 FINDINGS: Cardiac shadow stable in appearance. Postsurgical changes are again noted. The lungs are clear bilaterally. No focal infiltrate or effusion is seen. No bony abnormality is noted. IMPRESSION: No acute abnormality noted. Electronically Signed   By: Inez Catalina M.D.   On: 10/14/2019 02:28    Procedures Procedures (including critical care time)  Medications Ordered in ED Medications - No data to display  ED Course  I have reviewed the triage vital signs and the nursing notes.  Pertinent labs & imaging results that were available during my care of the patient were reviewed by me and considered in my medical decision making (see chart for details).    MDM Rules/Calculators/A&P                         Altered mental status, improving back to baseline now.  Oriented x2 without focal neuro deficits  CT head shows chronic microvascular ischemia.  Chest x-ray is negative.  Labs are  reassuring with negative troponin and urinalysis.  Patient is a nonfocal neurological exam.  Unclear whether this represents TIA versus oversedation versus polypharmacy. She appears to be near her baseline with mild AKI.  She is hydrated.  No answer at son's phone number that was given.  Granddaughter does not have any further information.  Plan observation admission to rule out TIA as well as other causes of altered mental status.  Discussed with Dr. Olevia Bowens. Final Clinical Impression(s) / ED Diagnoses Final diagnoses:  AMS (altered mental status)    Rx / DC Orders ED Discharge Orders    None       Edan Serratore, Annie Main, MD 10/14/19 0501

## 2019-10-15 DIAGNOSIS — F0390 Unspecified dementia without behavioral disturbance: Secondary | ICD-10-CM | POA: Diagnosis not present

## 2019-10-15 DIAGNOSIS — I10 Essential (primary) hypertension: Secondary | ICD-10-CM

## 2019-10-15 DIAGNOSIS — E785 Hyperlipidemia, unspecified: Secondary | ICD-10-CM

## 2019-10-15 DIAGNOSIS — I2510 Atherosclerotic heart disease of native coronary artery without angina pectoris: Secondary | ICD-10-CM | POA: Diagnosis not present

## 2019-10-15 DIAGNOSIS — E876 Hypokalemia: Secondary | ICD-10-CM

## 2019-10-15 DIAGNOSIS — N1832 Chronic kidney disease, stage 3b: Secondary | ICD-10-CM

## 2019-10-15 DIAGNOSIS — E0590 Thyrotoxicosis, unspecified without thyrotoxic crisis or storm: Secondary | ICD-10-CM

## 2019-10-15 LAB — URINE CULTURE: Culture: NO GROWTH

## 2019-10-15 NOTE — Discharge Summary (Signed)
Physician Discharge Summary  Erin Jordan NKN:[id number] DOB: Nov 29, 1945 DOA: 10/14/2019  PCP: [name], FNP  Admit date: 10/14/2019 Discharge date: 10/15/2019  Admitted From:  Home  Disposition: Home   Recommendations for Outpatient Follow-up:  1. Follow up with PCP in 1 weeks  Discharge Condition: STABLE   CODE STATUS: FULL    Brief Hospitalization Summary: Please see all hospital notes, images, labs for full details of the hospitalization. ADMISSION HPI: Erin Jordan is a 74 y.o. female with medical history significant of osteoarthritis, renal cell carcinoma, renal hematoma, stage III CKD, CAD, history of NSTEMI, s/p CABG, essential hypertension, free monoclonal light chain, type 2 diabetes, dementia on donezepil, hyperlipidemia who is brought via EMS to the emergency department after the patient was noticed to be confused and generally weak by her granddaughter who was returning from work.  Her mental status has improved she is alert, oriented to self, but disoriented to place, time, date and situation.  She answers simple questions.  She denies headache, dyspnea, chest pain, abdominal or any other pain at this time.  She is otherwise unable to elaborate and provide further information.  ED Course: Initial vital signs were temperature 97.8 F, pulse 70, respiration 18, blood pressure 185/73 mmHg and O2 sat 100% on room air.  The patient received a 1000 mL NS bolus in the ED.  Urinalysis shows proteinuria of 100 mg/dL, but is otherwise unremarkable.  CBC is normal.  Lipase was sixty-eight.  Troponin was 6 and then 8 ng/L.  Acetaminophen, salicylate and alcohol level are within normal limits.  CMP shows a potassium of 3.4 mmol/L, all other electrolytes are within normal range.  Glucose 143, BUN 33 and creatinine 1.86 mg/dL.  Hepatic functions are within normal range.  Imaging: A one view chest radiograph did not show any acute abnormality.  CT head without contrast showed no  acute intracranial findings.  There is chronic microvascular angiopathy and parenchymal volume loss.  There is also intracranial atherosclerosis.  Please see images and full radiology report for further detail.  Patient was admitted for altered mental status and seemed to be a little worse than her normal baseline dementia.  She was evaluated.  No UTI was determined.  She had a mild hypokalemia that was treated.  She had an MRI brain that showed no signs of ischemic changes no CVA findings.  She was given gentle hydration supportive care and she seems to be back at her baseline mental status.  She did have an episode of sundowning likely precipitated by being in a hospital setting.  She is stable to discharge home to follow-up outpatient with her primary care provider and other providers.  Discharge Diagnoses:  Principal Problem:   AMS (altered mental status) Active Problems:   Hypokalemia   Dyslipidemia, goal LDL below 70   Essential hypertension   Stage 3 chronic kidney disease   CAD -S/P PCI 06/26/17   Hyperthyroidism   Dementia without behavioral disturbance [facility])  Discharge Instructions:  Allergies as of 10/15/2019   No Known Allergies     Medication List    STOP taking these medications   hydrocortisone 25 MG suppository Commonly known as: ANUSOL-HC     TAKE these medications   amLODipine 5 MG tablet Commonly known as: NORVASC Take 1 tablet (5 mg total) by mouth daily.   atorvastatin 80 MG tablet Commonly known as: LIPITOR Take 1 tablet (80 mg total) by mouth daily.   calcium carbonate 1500 (600 Ca)  MG Tabs tablet Commonly known as: OSCAL Take 1,500 mg by mouth daily with breakfast.   cholecalciferol 25 MCG (1000 UNIT) tablet Commonly known as: VITAMIN D Take 1,000 Units by mouth daily.   donepezil 10 MG tablet Commonly known as: Aricept Take 1 tablet (10 mg total) by mouth at bedtime.   methimazole 5 MG tablet Commonly known as: TAPAZOLE TAKE ONE TABLET BY  MOUTH DAILY.   metoprolol succinate 25 MG 24 hr tablet Commonly known as: Toprol XL Take 1 tablet (25 mg total) by mouth daily.   nitroGLYCERIN 0.4 MG SL tablet Commonly known as: NITROSTAT Place 1 tablet (0.4 mg total) under the tongue every 5 (five) minutes x 3 doses as needed for chest pain.       Follow-up Information    [name], FNP. Schedule an appointment as soon as possible for a visit in 1 week(s).   Specialty: Family Medicine Contact information: [address] [place] [address] [phone]        Satira Sark, MD. Schedule an appointment as soon as possible for a visit in 1 month(s).   Specialty: Cardiology Contact information: [place] [address] [phone]              No Known Allergies Allergies as of 10/15/2019   No Known Allergies     Medication List    STOP taking these medications   hydrocortisone 25 MG suppository Commonly known as: ANUSOL-HC     TAKE these medications   amLODipine 5 MG tablet Commonly known as: NORVASC Take 1 tablet (5 mg total) by mouth daily.   atorvastatin 80 MG tablet Commonly known as: LIPITOR Take 1 tablet (80 mg total) by mouth daily.   calcium carbonate 1500 (600 Ca) MG Tabs tablet Commonly known as: OSCAL Take 1,500 mg by mouth daily with breakfast.   cholecalciferol 25 MCG (1000 UNIT) tablet Commonly known as: VITAMIN D Take 1,000 Units by mouth daily.   donepezil 10 MG tablet Commonly known as: Aricept Take 1 tablet (10 mg total) by mouth at bedtime.   methimazole 5 MG tablet Commonly known as: TAPAZOLE TAKE ONE TABLET BY MOUTH DAILY.   metoprolol succinate 25 MG 24 hr tablet Commonly known as: Toprol XL Take 1 tablet (25 mg total) by mouth daily.   nitroGLYCERIN 0.4 MG SL tablet Commonly known as: NITROSTAT Place 1 tablet (0.4 mg total) under the tongue every 5 (five) minutes x 3 doses as needed for chest pain.      Procedures/Studies: CT Head Wo  Contrast  Result Date: 10/14/2019 CLINICAL DATA:  Encephalopathy, generalized weakness for 1 hour EXAM: CT HEAD WITHOUT CONTRAST TECHNIQUE: Contiguous axial images were obtained from the base of the skull through the vertex without intravenous contrast. COMPARISON:  CT 04/23/2017 FINDINGS: Brain: No evidence of acute infarction, hemorrhage, hydrocephalus, extra-axial collection or mass lesion/mass effect. Symmetric prominence of the ventricles, cisterns and sulci compatible with parenchymal volume loss. Patchy areas of white matter hypoattenuation are most compatible with chronic microvascular angiopathy. Vascular: Atherosclerotic calcification of the carotid siphons and intradural right vertebral artery. No worrisome hyperdense vessel. Skull: Several metallic hair pins are noted along the right scalp. No significant scalp swelling or hematoma. No calvarial fracture or worrisome osseous lesion. Mild benign hyperostosis frontalis interna. Sinuses/Orbits: Paranasal sinuses and mastoid air cells are predominantly clear. Included orbital structures are unremarkable. Other: None IMPRESSION: 1. No acute intracranial findings. 2. Chronic microvascular angiopathy and parenchymal volume loss.  3. Intracranial atherosclerosis. Electronically Signed   By: Lovena Le M.D.   On: 10/14/2019 03:11   MR BRAIN WO CONTRAST  Result Date: 10/14/2019 CLINICAL DATA:  Generalized weakness. EXAM: MRI HEAD WITHOUT CONTRAST TECHNIQUE: Multiplanar, multiecho pulse sequences of the brain and surrounding structures were obtained without intravenous contrast. COMPARISON:  Head CT from earlier today FINDINGS: Brain: No acute infarction, hemorrhage, hydrocephalus, extra-axial collection or mass lesion. Confluent FLAIR hyperintensity in the deep cerebral white matter attributed to chronic small vessel ischemia. Age normal brain volume. Scattered remote lobar hemorrhages which are extensive. Less extensive remote cerebellar hemispheric  hemorrhages. Vascular: Normal flow voids Skull and upper cervical spine: Normal marrow signal Sinuses/Orbits: Negative Other: Incomplete study due to patient request, coronal T2 and axial T1 weighted imaging was not acquired. IMPRESSION: 1. Truncated study.  No acute finding, including infarct. 2. Findings of amyloid angiopathy with confluent chronic white matter ischemia. Electronically Signed   By: Monte Fantasia M.D.   On: 10/14/2019 06:41   DG Chest Port 1 View  Result Date: 10/14/2019 CLINICAL DATA:  Generalized weakness and pain EXAM: PORTABLE CHEST 1 VIEW COMPARISON:  04/24/2018 FINDINGS: Cardiac shadow stable in appearance. Postsurgical changes are again noted. The lungs are clear bilaterally. No focal infiltrate or effusion is seen. No bony abnormality is noted. IMPRESSION: No acute abnormality noted. Electronically Signed   By: Inez Catalina M.D.   On: 10/14/2019 02:28      Subjective: Pt without complaints. She wants to go home.    Discharge Exam: Vitals:   10/15/19 0437 10/15/19 1109  BP: (!) 152/70 (!) 150/70  Pulse: 68 68  Resp: 16 16  Temp: (!) 97.5 F (36.4 C) (!) 97.5 F (36.4 C)  SpO2: 100%    Vitals:   10/14/19 1407 10/14/19 2025 10/15/19 0437 10/15/19 1109  BP: (!) 150/74 (!) 160/73 (!) 152/70 (!) 150/70  Pulse: 84 75 68 68  Resp: 19 16 16 16   Temp: 98.9 F (37.2 C) 99.2 F (37.3 C) (!) 97.5 F (36.4 C) (!) 97.5 F (36.4 C)  TempSrc: Oral Oral Oral   SpO2: 100% 100% 100%   Weight:    51 kg  Height:    5' 2.01" (1.575 m)   General: Pt is alert, with dementia, awake, not in acute distress Cardiovascular: RRR, S1/S2 +, no rubs, no gallops Respiratory: CTA bilaterally, no wheezing, no rhonchi Abdominal: Soft, NT, ND, bowel sounds + Extremities: no edema, no cyanosis Neurological: nonfocal exam.    The results of significant diagnostics from this hospitalization (including imaging, microbiology, ancillary and laboratory) are listed below for reference.      Microbiology: Recent Results (from the past 240 hour(s))  Urine culture     Status: None   Collection Time: 10/14/19  2:33 AM   Specimen: Urine, Clean Catch  Result Value Ref Range Status   Specimen Description   Final    URINE, CLEAN CATCH Performed at [facility], [address], [place], [place] [address]    Special Requests   Final    NONE Performed at [facility], [address], [place], [place] [address]    Culture   Final    NO GROWTH Performed at [facility], [place] [address], [place], [place] [address]    Report Status 10/15/2019 FINAL  Final  SARS Coronavirus 2 by RT PCR (hospital order, performed in [facility] hospital lab) Nasopharyngeal Nasopharyngeal Swab     Status: None   Collection Time: 10/14/19  3:50 AM   Specimen: Nasopharyngeal Swab  Result Value Ref Range Status   SARS Coronavirus 2 NEGATIVE NEGATIVE Final    Comment: (NOTE) SARS-CoV-2 target nucleic acids are NOT DETECTED.  The SARS-CoV-2 RNA is generally detectable in upper and lower respiratory specimens during the acute phase of infection. The lowest concentration of SARS-CoV-2 viral copies this assay can detect is 250 copies / mL. A negative result does not preclude SARS-CoV-2 infection and should not be used as the sole basis for treatment or other patient management decisions.  A negative result may occur with improper specimen collection / handling, submission of specimen other than nasopharyngeal swab, presence of viral mutation(s) within the areas targeted by this assay, and inadequate number of viral copies (<250 copies / mL). A negative result must be combined with clinical observations, patient history, and epidemiological information.  Fact Sheet for Patients:   StrictlyIdeas.no  Fact Sheet for Healthcare Providers: BankingDealers.co.za  This test is not yet approved or  cleared by the [place] FDA and has been authorized  for detection and/or diagnosis of SARS-CoV-2 by FDA under an Emergency Use Authorization (EUA).  This EUA will remain in effect (meaning this test can be used) for the duration of the COVID-19 declaration under Section 564(b)(1) of the Act, 21 U.S.C. section 360bbb-3(b)(1), unless the authorization is terminated or revoked sooner.  Performed at [facility], [address] [place], [place] [address]      Labs: BNP (last 3 results) No results for input(s): BNP in the last 8760 hours. Basic Metabolic Panel: Recent Labs  Lab 10/11/19 0150 10/14/19 0102 10/14/19 0241  NA 138 139  --   K 3.2* 3.4*  --   CL 104 104  --   CO2 24 22  --   GLUCOSE 165* 143*  --   BUN 30* 33*  --   CREATININE 1.64* 1.86*  --   CALCIUM 9.1 9.4  --   MG  --   --  2.2   Liver Function Tests: Recent Labs  Lab 10/11/19 0150 10/14/19 0102  AST 16 16  ALT 11 12  ALKPHOS 90 95  BILITOT 0.7 0.6  PROT 7.3 7.2  ALBUMIN 4.1 4.2   Recent Labs  Lab 10/11/19 0150 10/14/19 0102  LIPASE 90* 68*   No results for input(s): AMMONIA in the last 168 hours. CBC: Recent Labs  Lab 10/11/19 0150 10/14/19 0102  WBC 5.7 6.8  NEUTROABS 3.8 4.6  HGB 11.7* 12.7  HCT 35.6* 39.3  MCV 94.4 95.4  PLT 190 208   Cardiac Enzymes: No results for input(s): CKTOTAL, CKMB, CKMBINDEX, TROPONINI in the last 168 hours. BNP: Invalid input(s): POCBNP CBG: Recent Labs  Lab 10/14/19 0724 10/14/19 1111 10/14/19 1642  GLUCAP 144* 105* 109*   D-Dimer No results for input(s): DDIMER in the last 72 hours. Hgb A1c No results for input(s): HGBA1C in the last 72 hours. Lipid Profile No results for input(s): CHOL, HDL, LDLCALC, TRIG, CHOLHDL, LDLDIRECT in the last 72 hours. Thyroid function studies No results for input(s): TSH, T4TOTAL, T3FREE, THYROIDAB in the last 72 hours.  Invalid input(s): FREET3 Anemia work up No results for input(s): VITAMINB12, FOLATE, FERRITIN, TIBC, IRON, RETICCTPCT in the last 72  hours. Urinalysis    Component Value Date/Time   COLORURINE STRAW (A) 10/14/2019 0233   APPEARANCEUR CLEAR 10/14/2019 0233   LABSPEC 1.009 10/14/2019 0233   PHURINE 6.0 10/14/2019 0233   GLUCOSEU NEGATIVE 10/14/2019 0233   HGBUR NEGATIVE 10/14/2019  [place] 10/14/2019 0233   BILIRUBINUR neg 05/19/2019 [place] 10/14/2019 0233   PROTEINUR 100 (A) 10/14/2019 0233   UROBILINOGEN 0.2 05/19/2019 0947   UROBILINOGEN 0.2 06/01/2011 1822   NITRITE NEGATIVE 10/14/2019 0233   LEUKOCYTESUR NEGATIVE 10/14/2019 0233   Sepsis Labs Invalid input(s): PROCALCITONIN,  WBC,  LACTICIDVEN Microbiology Recent Results (from the past 240 hour(s))  Urine culture     Status: None   Collection Time: 10/14/19  2:33 AM   Specimen: Urine, Clean Catch  Result Value Ref Range Status   Specimen Description   Final    URINE, CLEAN CATCH Performed at [facility], [address], [place], [place] [address]    Special Requests   Final    NONE Performed at [facility], [address] [place], [place] [address]    Culture   Final    NO GROWTH Performed at [facility], [place] [address], [place], [place] [address]    Report Status 10/15/2019 FINAL  Final  SARS Coronavirus 2 by RT PCR (hospital order, performed in [facility] hospital lab) Nasopharyngeal Nasopharyngeal Swab     Status: None   Collection Time: 10/14/19  3:50 AM   Specimen: Nasopharyngeal Swab  Result Value Ref Range Status   SARS Coronavirus 2 NEGATIVE NEGATIVE Final    Comment: (NOTE) SARS-CoV-2 target nucleic acids are NOT DETECTED.  The SARS-CoV-2 RNA is generally detectable in upper and lower respiratory specimens during the acute phase of infection. The lowest concentration of SARS-CoV-2 viral copies this assay can detect is 250 copies / mL. A negative result does not preclude SARS-CoV-2 infection and should not be used as the sole basis for treatment or other patient management decisions.  A  negative result may occur with improper specimen collection / handling, submission of specimen other than nasopharyngeal swab, presence of viral mutation(s) within the areas targeted by this assay, and inadequate number of viral copies (<250 copies / mL). A negative result must be combined with clinical observations, patient history, and epidemiological information.  Fact Sheet for Patients:   StrictlyIdeas.no  Fact Sheet for Healthcare Providers: BankingDealers.co.za  This test is not yet approved or  cleared by the [place] FDA and has been authorized for detection and/or diagnosis of SARS-CoV-2 by FDA under an Emergency Use Authorization (EUA).  This EUA will remain in effect (meaning this test can be used) for the duration of the COVID-19 declaration under Section 564(b)(1) of the Act, 21 U.S.C. section 360bbb-3(b)(1), unless the authorization is terminated or revoked sooner.  Performed at [facility], [address] [place], [place] [address]    Time coordinating discharge:   SIGNED:  Irwin Brakeman, MD  [facility] 10/15/2019, 11:35 AM How to contact the [facility] Attending or Consulting provider [place] or covering provider during after hours [place], for this patient?  1. Check the care team in [facility] and look for a) attending/consulting [facility] provider listed and b) the [facility] team listed 2. Log into [email or web address] and use 's universal password to access. If you do not have the password, please contact the hospital operator. 3. Locate the [facility] provider you are looking for under [facility] and page to a number that you can be directly reached. 4. If you still have difficulty reaching the provider, please page the [facility] (Director on Call) for the Hospitalists listed on amion for assistance.

## 2019-10-15 NOTE — Discharge Instructions (Signed)
Dementia °Dementia is a condition that affects the way the brain works. It often affects memory and thinking. There are many types of dementia. Some types get worse with time and cannot be reversed. Some types of dementia include: °· Alzheimer's disease. This is the most common type. °· Vascular dementia. This type may happen due to a stroke. °· Lewy body dementia. This type may happen to people who have Parkinson's disease. °· Frontotemporal dementia. This type is caused by damage to nerve cells in certain parts of the brain. °Some people may have more than one type, and this is called mixed dementia. °What are the causes? °This condition is caused by damage to cells in the brain. Some causes that cannot be reversed include: °· Having a condition that affects the blood vessels of the brain, such as diabetes, heart disease, or blood vessel disease. °· Changes to genes. °Some causes that can be reversed or slowed include: °· Injury to the brain. °· Certain medicines. °· Infection. °· Not having enough vitamin B12 in the body, or thyroid problems. °· A tumor or blood clot in the brain. °What are the signs or symptoms? °Symptoms depend on the type of dementia. This may include: °· Problems remembering things. °· Having trouble taking a bath or putting clothes on. °· Forgetting appointments. °· Forgetting to pay bills. °· Trouble planning and making meals. °· Having trouble speaking. °· Getting lost easily. °How is this treated? °Treatment depends on the cause of the dementia. It might include taking medicines that help: °· To control the dementia. °· To slow down the dementia. °· To manage symptoms. °In some cases, treating the cause of your dementia can improve symptoms, reverse symptoms, or slow down how quickly it gets worse. Your doctor can help you find support groups and other doctors who can help with your care. °Follow these instructions at home: °Medicines °· Take over-the-counter and prescription medicines  only as told by your doctor. °· Use a pill organizer to help you manage your medicines. °· Avoidtaking medicines for pain or for sleep. °Lifestyle °· Make healthy choices: °? Be active as told by your doctor. °? Do not use any products that contain nicotine or tobacco, such as cigarettes, e-cigarettes, and chewing tobacco. If you need help quitting, ask your doctor. °? Do not drink alcohol. °? When you get stressed, do something that will help you to relax. Your doctor can give you tips. °? Spend time with other people. °· Make sure you get good sleep. To get good sleep: °? Try not to take naps during the day. °? Keep your bedroom dark and cool. °? In the few hours before you go to bed, try not to do any exercise. °? Do not have foods and drinks with caffeine at night. °Eating and drinking °· Drink enough fluid to keep your pee (urine) pale yellow. °· Eat a healthy diet. °General instructions ° °· Talk with your doctor to figure out: °? What you need help with. °? What your safety needs are. °· Ask your doctor if it is safe for you to drive. °· If told, wear a bracelet that tracks where you are or shows that you are a person with memory loss. °· Work with your family to make big decisions. °· Keep all follow-up visits as told by your doctor. This is important. °Contact a doctor if: °· You have any new symptoms. °· Your symptoms get worse. °· You have problems with swallowing or choking. °Get help   right away if:  You feel very sad, or feel that you want to harm yourself.  You or your family members are worried for your safety. If you ever feel like you may hurt yourself or others, or have thoughts about taking your own life, get help right away. You can go to your nearest emergency department or call:  Your local emergency services (911 in the [place]  A suicide crisis helpline, such as the [facility] at 534 383 2153. This is open 24 hours a day. Summary  Dementia often affects  memory and thinking.  Some types of dementia get worse with time and cannot be reversed.  Treatment for this condition depends on the cause.  Talk with your doctor to figure out what you need help with.  Your doctor can help you find support groups and other doctors who can help with your care. This information is not intended to replace advice given to you by your health care provider. Make sure you discuss any questions you have with your health care provider. Document Revised: 06/09/2018 Document Reviewed: 06/09/2018 Elsevier Patient Education  [place].     Delirium Delirium is a state of mental confusion. It comes on quickly and causes significant changes in a person's thinking and behavior. People with delirium usually have trouble paying attention to what is going on or knowing where they are. They may become very withdrawn or very emotional and unable to sit still. They may even see or feel things that are not there (hallucinations). Delirium is a sign of a serious underlying medical condition. What are the causes? Delirium occurs when something suddenly affects the signals that the brain sends out. Brain signals can be affected by anything that puts severe stress on the body and brain and causes brain chemicals to be out of balance. The most common causes of delirium include:  Infections. These may be bacterial, viral, fungal, or protozoal.  Medicines. These include many over-the-counter and prescription medicines.  Recreational drugs.  Substance withdrawal. This occurs with sudden discontinuation of alcohol, certain medicines, or recreational drugs.  Surgery and anesthesia.  Sudden vascular events, such as stroke and brain hemorrhage.  Other brain disorders, such as migraines, tumors, seizures, and physical head trauma.  Metabolic disorders, such as kidney or liver failure.  Low blood oxygen (anoxia). This may occur with lung disease, cardiac arrest, or  carbon monoxide poisoning.  Hormone imbalances (endocrinopathies), such as an overactive thyroid (hyperthyroidism) or underactive thyroid (hypothyroidism).  Vitamin deficiencies. What increases the risk? The following factors may make someone more likely to develop this condition.  Being a child.  Being an older person.  Living alone.  Having vision loss or hearing loss.  Having an existing brain disease, such as dementia.  Having long-lasting (chronic) medical conditions, such as heart disease.  Being hospitalized for long periods of time. What are the signs or symptoms? Delirium starts with a sudden change in a person's thinking or behavior. Symptoms include:  Not being able to stay awake (drowsiness) or pay attention.  Being confused about places, time, and people.  Forgetfulness.  Having extreme energy levels. These may be low or high.  Changes in sleep patterns.  Extreme mood swings, such as sudden anger or anxiety.  Focusing on things or ideas that are not important.  Rambling and senseless talking.  Difficulty speaking, understanding speech, or both.  Hallucinations.  Tremor or unsteady gait. Symptoms come and go (fluctuate) over time, and they are often worse  at the end of the day. How is this diagnosed? People with delirium may not realize that they have the condition. Often, a family member or health care provider is the first person to notice the changes. This condition may be diagnosed based on a physical exam, health history, and tests.  The health care provider will obtain a detailed history. This may include questions about: ? Current symptoms. ? Medical issues. ? Medicines. ? Recreational drug use.  The health care provider will perform a mental status examination by: ? Asking questions to check for confusion. ? Watching for abnormal behavior.  The health care provider may also order lab tests or additional studies to determine the cause of  the delirium. How is this treated? Treatment of delirium depends on the cause and severity. Delirium usually goes away within days or weeks of treating the underlying cause. In the meantime, do not leave the person alone because he or she may accidentally cause self-harm. This condition may be treated with supportive care, such as:  Increased light during the day and decreased light at night.  Low noise level.  Uninterrupted sleep.  A regular daily schedule.  Clocks and calendars to help with orientation.  Familiar objects, including the person's pictures and clothing.  Frequent visits from familiar family and friends.  A healthy diet.  Gentle exercise. In more severe cases of delirium, medicine may be prescribed to help the person keep calm and think more clearly. Follow these instructions at home:  Continue supportive care as told by a health care provider.  Over-the-counter and prescription medicines should be taken only as told by a health care provider.  Ask a health care provider before using herbs or supplements.  Do not use alcohol or recreational drugs.  Keep all follow-up visits as told by a health care provider. This is important. Contact a health care provider if:  Symptoms do not get better or they become worse.  New symptoms of delirium develop.  Caring for the person at home does not seem safe.  Eating, drinking, or communicating stops.  There are side effects of medicines, such as changes in sleep patterns, dizziness, weight gain, restlessness, movement changes, or tremors. Get help right away if:  Serious thoughts occur about self-harm or about hurting others.  There are serious side effects of medicine, such as: ? Swelling of the face, lips, tongue, or throat. ? Fever, confusion, muscle spasms, or seizures. Summary  Delirium is a state of mental confusion. It comes on quickly and causes significant changes in a person's thinking and  behavior.  Delirium is a sign of a serious underlying medical condition.  Certain medical conditions or a long hospital stay may increase the risk of developing delirium.  Treatment of delirium involves treating the underlying cause and providing supportive treatments, such as a calm and familiar environment. This information is not intended to replace advice given to you by your health care provider. Make sure you discuss any questions you have with your health care provider. Document Revised: 11/13/2017 Document Reviewed: 11/13/2017 Elsevier Patient Education  [place].   IMPORTANT INFORMATION: PAY CLOSE ATTENTION   PHYSICIAN DISCHARGE INSTRUCTIONS  Follow with Primary care provider  [name], FNP  and other consultants as instructed by your Hospitalist Physician  [place] IF SYMPTOMS COME BACK, WORSEN OR NEW PROBLEM DEVELOPS   Please note: You were cared for by a hospitalist during your hospital stay. Every effort will  be made to forward records to your primary care provider.  You can request that your primary care provider send for your hospital records if they have not received them.  Once you are discharged, your primary care physician will handle any further medical issues. Please note that NO REFILLS for any discharge medications will be authorized once you are discharged, as it is imperative that you return to your primary care physician (or establish a relationship with a primary care physician if you do not have one) for your post hospital discharge needs so that they can reassess your need for medications and monitor your lab values.  Please get a complete blood count and chemistry panel checked by your Primary MD at your next visit, and again as instructed by your Primary MD.  Get Medicines reviewed and adjusted: Please take all your medications with you for your next visit with your Primary MD  Laboratory/radiological  data: Please request your Primary MD to go over all hospital tests and procedure/radiological results at the follow up, please ask your primary care provider to get all Hospital records sent to his/her office.  In some cases, they will be blood work, cultures and biopsy results pending at the time of your discharge. Please request that your primary care provider follow up on these results.  If you are diabetic, please bring your blood sugar readings with you to your follow up appointment with primary care.    Please call and make your follow up appointments as soon as possible.    Also Note the following: If you experience worsening of your admission symptoms, develop shortness of breath, life threatening emergency, suicidal or homicidal thoughts you must seek medical attention immediately by calling 911 or calling your MD immediately  if symptoms less severe.  You must read complete instructions/literature along with all the possible adverse reactions/side effects for all the Medicines you take and that have been prescribed to you. Take any new Medicines after you have completely understood and accpet all the possible adverse reactions/side effects.   Do not drive when taking Pain medications or sleeping medications (Benzodiazepines)  Do not take more than prescribed Pain, Sleep and Anxiety Medications. It is not advisable to combine anxiety,sleep and pain medications without talking with your primary care practitioner  Special Instructions: If you have smoked or chewed Tobacco  in the last 2 yrs please stop smoking, stop any regular Alcohol  and or any Recreational drug use.  Wear Seat belts while driving.  Do not drive if taking any narcotic, mind altering or controlled substances or recreational drugs or alcohol.

## 2019-10-15 NOTE — Progress Notes (Signed)
Pt confused/upset at beginning of shift. States that he family "left her here to rot." Attempted to get up several times unattended and was re-directed. Notified on-call MD to make aware as pt may need something to calm her down. After talking with pt, explained why she was here she appeared calmer. Assisted to bed but pt continues to be confused at times, has pulled out IV and tele monitor several times. Pleasant and able to re-direct. Will continue to monitor.

## 2019-10-28 ENCOUNTER — Ambulatory Visit
Admission: EM | Admit: 2019-10-28 | Discharge: 2019-10-28 | Disposition: A | Discharge status: Home / Self Care | Payer: Medicare HMO | Attending: Emergency Medicine | Admitting: Emergency Medicine

## 2019-10-28 ENCOUNTER — Other Ambulatory Visit: Payer: Self-pay

## 2019-10-28 ENCOUNTER — Encounter: Payer: Self-pay | Admitting: Emergency Medicine

## 2019-10-28 DIAGNOSIS — Z20822 Contact with and (suspected) exposure to covid-19: Secondary | ICD-10-CM

## 2019-10-28 NOTE — Discharge Instructions (Addendum)

## 2019-10-28 NOTE — ED Triage Notes (Signed)
Pt needs to be tested for covid. Denies any s/s

## 2019-10-28 NOTE — ED Provider Notes (Signed)
[facility]   [id number] 10/28/19 Arrival Time: 28   CC: COVID exposure  SUBJECTIVE: History from: patient.  Erin Jordan is a 74 y.o. female who presents for COVID testing after positive Covid exposure.  Denies sick exposure to  flu or strep.  Denies recent travel.  Denies aggravating or alleviating symptoms.  Denies previous COVID infection.   Denies fever, chills, fatigue, nasal congestion, rhinorrhea, sore throat, cough, SOB, wheezing, chest pain, nausea, vomiting, changes in bowel or bladder habits.    ROS: As per HPI.  All other pertinent ROS negative.     Past Medical History:  Diagnosis Date  . Arthritis   . Cancer of kidney ([place])   . Chronic kidney disease, stage 3, mod decreased GFR   . Coronary atherosclerosis of native coronary artery 2006   Multivessel status post CABG in [place], graft disease documented March 2019 with DES to SVG to diagonal  . Essential hypertension   . 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 History of diabetes mellitus, type II   . Hyperlipidemia   . NSTEMI (non-ST elevated myocardial infarction) ([place]) 06/24/2017  . Renal hematoma   . Right renal mass    Past Surgical History:  Procedure Laterality Date  . ABDOMINAL HYSTERECTOMY    . CORONARY ARTERY BYPASS GRAFT  2006   [place], [place]  . CORONARY STENT INTERVENTION N/A 06/25/2017   Procedure: CORONARY STENT INTERVENTION;  Surgeon: Jettie Booze, MD;  Location: [facility];  Service: Cardiovascular;  Laterality: N/A;  . IR RADIOLOGIST EVAL & MGMT  07/08/2019  . LEFT HEART CATH AND CORS/GRAFTS ANGIOGRAPHY N/A 06/25/2017   Procedure: LEFT HEART CATH AND CORS/GRAFTS ANGIOGRAPHY;  Surgeon: Jettie Booze, MD;  Location: [facility];  Service: Cardiovascular;  Laterality: N/A;   No Known Allergies No current facility-administered medications on file prior to encounter.   Current Outpatient Medications on File Prior to Encounter  Medication  Sig Dispense Refill  . amLODipine (NORVASC) 5 MG tablet Take 1 tablet (5 mg total) by mouth daily. 90 tablet 1  . atorvastatin (LIPITOR) 80 MG tablet Take 1 tablet (80 mg total) by mouth daily. 90 tablet 1  . calcium carbonate (OSCAL) 1500 (600 Ca) MG TABS tablet Take 1,500 mg by mouth daily with breakfast.     . cholecalciferol (VITAMIN D) 25 MCG (1000 UT) tablet Take 1,000 Units by mouth daily.     Marland Kitchen donepezil (ARICEPT) 10 MG tablet Take 1 tablet (10 mg total) by mouth at bedtime. 30 tablet 2  . methimazole (TAPAZOLE) 5 MG tablet TAKE ONE TABLET BY MOUTH DAILY. (Patient taking differently: Take 5 mg by mouth daily. ) 30 tablet 2  . metoprolol succinate (TOPROL XL) 25 MG 24 hr tablet Take 1 tablet (25 mg total) by mouth daily. 90 tablet 3  . nitroGLYCERIN (NITROSTAT) 0.4 MG SL tablet Place 1 tablet (0.4 mg total) under the tongue every 5 (five) minutes x 3 doses as needed for chest pain. 25 tablet 3   Social History   Socioeconomic History  . Marital status: Widowed    Spouse name: Not on file  . Number of children: 6  . Years of education: Not on file  . Highest education level: Not on file  Occupational History  . Occupation: retired  Tobacco Use  . Smoking status: Never Smoker  . Smokeless tobacco: Never Used  Vaping Use  . Vaping Use: Never used  Substance and Sexual Activity  .  Alcohol use: No  . Drug use: No  . Sexual activity: Never  Other Topics Concern  . Not on file  Social History Narrative   Lives in [place] with her daughter and grandchildren.   Social Determinants of Health   Financial Resource Strain: Medium Risk  . Difficulty of Paying Living Expenses: Somewhat hard  Food Insecurity: No Food Insecurity  . Worried About Charity fundraiser in the Last Year: Never true  . Ran Out of Food in the Last Year: Never true  Transportation Needs: No Transportation Needs  . Lack of Transportation (Medical): No  . Lack of Transportation (Non-Medical): No  Physical  Activity: Inactive  . Days of Exercise per Week: 0 days  . Minutes of Exercise per Session: 0 min  Stress: No Stress Concern Present  . Feeling of Stress : Only a little  Social Connections: Moderately Isolated  . Frequency of Communication with Friends and Family: Three times a week  . Frequency of Social Gatherings with Friends and Family: Three times a week  . Attends Religious Services: 1 to 4 times per year  . Active Member of Clubs or Organizations: No  . Attends Archivist Meetings: Never  . Marital Status: Widowed  Intimate Partner Violence: Not At Risk  . Fear of Current or Ex-Partner: No  . Emotionally Abused: No  . Physically Abused: No  . Sexually Abused: No   Family History  Problem Relation Age of Onset  . Heart attack Mother   . Hypertension Mother   . Seizures Son   . Seizures Son   . Seizures Daughter     OBJECTIVE:  There were no vitals filed for this visit.   General appearance: alert; appears fatigued, but nontoxic; speaking in full sentences and tolerating own secretions HEENT: NCAT; Ears: EACs clear, TMs pearly gray; Eyes: PERRL.  EOM grossly intact. Sinuses: nontender; Nose: nares patent without rhinorrhea, Throat: oropharynx clear, tonsils non erythematous or enlarged, uvula midline  Neck: supple without LAD Lungs: unlabored respirations, symmetrical air entry; cough: absent; no respiratory distress; CTAB Heart: regular rate and rhythm.  Radial pulses 2+ symmetrical bilaterally Skin: warm and dry Psychological: alert and cooperative; normal mood and affect  LABS:  No results found for this or any previous visit (from the past 24 hour(s)).   ASSESSMENT & PLAN:  No diagnosis found.  No orders of the defined types were placed in this encounter.   Discharge Instructions.    COVID testing ordered.  It will take between 2-7 days for test results.  Someone will contact you regarding abnormal results.    In the meantime: You should  remain isolated in your home for 10 days from symptom onset AND greater than 24 hours after symptoms resolution (absence of fever without the use of fever-reducing medication and improvement in respiratory symptoms), whichever is longer Get plenty of rest and push fluids Use medications daily for symptom relief Use OTC medications like ibuprofen or tylenol as needed fever or pain Call or go to the ED if you have any new or worsening symptoms such as fever, worsening cough, shortness of breath, chest tightness, chest pain, turning blue, changes in mental status, etc...   Reviewed expectations re: course of current medical issues. Questions answered. Outlined signs and symptoms indicating need for more acute intervention. Patient verbalized understanding. After Visit Summary given.      Note: This document was prepared using Dragon voice recognition software and may include unintentional dictation errors.  Emerson Monte, FNP 10/28/19 1756

## 2019-10-29 LAB — NOVEL CORONAVIRUS, NAA: SARS-CoV-2, NAA: NOT DETECTED

## 2019-10-29 LAB — SARS-COV-2, NAA 2 DAY TAT

## 2019-11-04 ENCOUNTER — Telehealth: Payer: Self-pay | Admitting: Urology

## 2019-11-04 ENCOUNTER — Ambulatory Visit: Payer: Medicare HMO | Admitting: Urology

## 2019-11-04 NOTE — Telephone Encounter (Signed)
Pts caretaker came in today and thought they had appt. (It was rescheduled twice for drs surgery) She states the pt has been back and forth to hospital and her condition is worse. She doesn't think pt can wait until September to be seen.

## 2019-11-04 NOTE — Telephone Encounter (Signed)
Called granddrt and notified appt moved up. Gave new date and time.

## 2019-11-08 ENCOUNTER — Telehealth: Payer: Self-pay | Admitting: Family Medicine

## 2019-11-16 ENCOUNTER — Ambulatory Visit: Payer: Medicare HMO | Admitting: Urology

## 2019-11-29 ENCOUNTER — Ambulatory Visit: Payer: Medicare HMO | Admitting: Urology

## 2019-12-06 ENCOUNTER — Other Ambulatory Visit: Payer: Self-pay | Admitting: Family Medicine

## 2019-12-06 DIAGNOSIS — E042 Nontoxic multinodular goiter: Secondary | ICD-10-CM

## 2019-12-06 DIAGNOSIS — F0390 Unspecified dementia without behavioral disturbance: Secondary | ICD-10-CM

## 2019-12-20 ENCOUNTER — Other Ambulatory Visit (HOSPITAL_COMMUNITY): Payer: Medicare HMO

## 2019-12-22 ENCOUNTER — Encounter: Payer: Self-pay | Admitting: Urology

## 2019-12-22 ENCOUNTER — Ambulatory Visit (INDEPENDENT_AMBULATORY_CARE_PROVIDER_SITE_OTHER): Payer: Medicare HMO | Admitting: Urology

## 2019-12-22 ENCOUNTER — Other Ambulatory Visit: Payer: Self-pay

## 2019-12-22 VITALS — BP 156/72 | HR 73 | Temp 97.4°F | Ht 62.0 in | Wt 112.4 lb

## 2019-12-22 DIAGNOSIS — N2889 Other specified disorders of kidney and ureter: Secondary | ICD-10-CM | POA: Diagnosis not present

## 2019-12-22 LAB — URINALYSIS, ROUTINE W REFLEX MICROSCOPIC
Bilirubin, UA: NEGATIVE
Glucose, UA: NEGATIVE
Ketones, UA: NEGATIVE
Leukocytes,UA: NEGATIVE
Nitrite, UA: NEGATIVE
RBC, UA: NEGATIVE
Specific Gravity, UA: 1.02 (ref 1.005–1.030)
Urobilinogen, Ur: 0.2 mg/dL (ref 0.2–1.0)
pH, UA: 5 (ref 5.0–7.5)

## 2019-12-22 LAB — MICROSCOPIC EXAMINATION
Epithelial Cells (non renal): >10 /hpf — AB (ref 0–10)
RBC, Urine: NONE SEEN /hpf (ref 0–2)
Renal Epithel, UA: NONE SEEN /hpf

## 2019-12-22 NOTE — Progress Notes (Signed)
12/22/2019 11:09 AM   [name] [birth year] [id number]  Referring provider: [name], [address] [place],  [place] [id number]  followup right renal mass  HPI: Ms Erin Jordan is a 74yo here for followup for right renal cell carcinoma. Baseline creatinine 2.8. She underwent right renal mass biopsy on 07/27/2019. Biopsy was complicated by renal hemorrhage. She presents today for followup. NO recent abdominal imaging.    PMH: Past Medical History:  Diagnosis Date  . Arthritis   . Cancer of kidney ([place])   . Chronic kidney disease, stage 3, mod decreased GFR   . Coronary atherosclerosis of native coronary artery 2006   Multivessel status post CABG in [place], graft disease documented March 2019 with DES to SVG to diagonal  . Essential hypertension   . 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 History of diabetes mellitus, type II   . Hyperlipidemia   . NSTEMI (non-ST elevated myocardial infarction) ([place]) 06/24/2017  . Renal hematoma   . Right renal mass     Surgical History: Past Surgical History:  Procedure Laterality Date  . ABDOMINAL HYSTERECTOMY    . CORONARY ARTERY BYPASS GRAFT  2006   [place], [place]  . CORONARY STENT INTERVENTION N/A 06/25/2017   Procedure: CORONARY STENT INTERVENTION;  Surgeon: Jettie Booze, MD;  Location: [facility];  Service: Cardiovascular;  Laterality: N/A;  . IR RADIOLOGIST EVAL & MGMT  07/08/2019  . LEFT HEART CATH AND CORS/GRAFTS ANGIOGRAPHY N/A 06/25/2017   Procedure: LEFT HEART CATH AND CORS/GRAFTS ANGIOGRAPHY;  Surgeon: Jettie Booze, MD;  Location: [facility];  Service: Cardiovascular;  Laterality: N/A;    Home Medications:  Allergies as of 12/22/2019   No Known Allergies     Medication List       Accurate as of December 22, 2019 11:09 AM. If you have any questions, ask your nurse or doctor.        amLODipine 5 MG tablet Commonly known as: NORVASC Take 1 tablet (5 mg total) by  mouth daily.   atorvastatin 80 MG tablet Commonly known as: LIPITOR Take 1 tablet (80 mg total) by mouth daily.   calcium carbonate 1500 (600 Ca) MG Tabs tablet Commonly known as: OSCAL Take 1,500 mg by mouth daily with breakfast.   cholecalciferol 25 MCG (1000 UNIT) tablet Commonly known as: VITAMIN D Take 1,000 Units by mouth daily.   donepezil 10 MG tablet Commonly known as: ARICEPT Take 1 tablet (10 mg total) by mouth at bedtime. (Needs to be seen before next refill)   methimazole 5 MG tablet Commonly known as: TAPAZOLE TAKE ONE TABLET BY MOUTH DAILY.   metoprolol succinate 25 MG 24 hr tablet Commonly known as: Toprol XL Take 1 tablet (25 mg total) by mouth daily.   nitroGLYCERIN 0.4 MG SL tablet Commonly known as: NITROSTAT Place 1 tablet (0.4 mg total) under the tongue every 5 (five) minutes x 3 doses as needed for chest pain.       Allergies: No Known Allergies  Family History: Family History  Problem Relation Age of Onset  . Heart attack Mother   . Hypertension Mother   . Seizures Son   . Seizures Son   . Seizures Daughter     Social History:  reports that she has never smoked. She has never used smokeless tobacco. She reports that she does not drink alcohol and does not use drugs.  ROS: All other review of systems were reviewed  and are negative except what is noted above in HPI  Physical Exam: BP (!) 156/72   Pulse 73   Temp (!) 97.4 F (36.3 C)   Ht 5\' 2"  (1.575 m)   Wt 112 lb 6.9 oz (51 kg)   BMI 20.56 kg/m   Constitutional:  Alert and oriented, No acute distress. HEENT: [place] AT, moist mucus membranes.  Trachea midline, no masses. Cardiovascular: No clubbing, cyanosis, or edema. Respiratory: Normal respiratory effort, no increased work of breathing. GI: Abdomen is soft, nontender, nondistended, no abdominal masses GU: No CVA tenderness.  Lymph: No cervical or inguinal lymphadenopathy. Skin: No rashes, bruises or suspicious  lesions. Neurologic: Grossly intact, no focal deficits, moving all 4 extremities. Psychiatric: Normal mood and affect.  Laboratory Data: Lab Results  Component Value Date   WBC 6.8 10/14/2019   HGB 12.7 10/14/2019   HCT 39.3 10/14/2019   MCV 95.4 10/14/2019   PLT 208 10/14/2019    Lab Results  Component Value Date   CREATININE 1.86 (H) 10/14/2019    No results found for: PSA  No results found for: TESTOSTERONE  Lab Results  Component Value Date   HGBA1C 6.2 (H) 08/03/2019    Urinalysis    Component Value Date/Time   COLORURINE STRAW (A) 10/14/2019 0233   APPEARANCEUR CLEAR 10/14/2019 0233   LABSPEC 1.009 10/14/2019 0233   PHURINE 6.0 10/14/2019 0233   GLUCOSEU NEGATIVE 10/14/2019 0233   HGBUR NEGATIVE 10/14/2019 0233   BILIRUBINUR NEGATIVE 10/14/2019 0233   BILIRUBINUR neg 05/19/2019 0947   KETONESUR NEGATIVE 10/14/2019 0233   PROTEINUR 100 (A) 10/14/2019 0233   UROBILINOGEN 0.2 05/19/2019 0947   UROBILINOGEN 0.2 06/01/2011 1822   NITRITE NEGATIVE 10/14/2019 0233   LEUKOCYTESUR NEGATIVE 10/14/2019 0233    Lab Results  Component Value Date   BACTERIA NONE SEEN 10/14/2019    Pertinent Imaging: CT 08/06/2019: Images reviewed and discussed with the patient No results found for this or any previous visit.  Results for orders placed during the hospital encounter of 08/10/19  US Venous Img Lower Bilateral (DVT)  Narrative CLINICAL DATA:  Pain in bilateral lower extremities for 1 week  EXAM: BILATERAL LOWER EXTREMITY VENOUS DOPPLER ULTRASOUND  TECHNIQUE: Gray-scale sonography with graded compression, as well as color Doppler and duplex ultrasound were performed to evaluate the lower extremity deep venous systems from the level of the common femoral vein and including the common femoral, femoral, profunda femoral, popliteal and calf veins including the posterior tibial, peroneal and gastrocnemius veins when visible. The superficial great saphenous vein  was also interrogated. Spectral Doppler was utilized to evaluate flow at rest and with distal augmentation maneuvers in the common femoral, femoral and popliteal veins.  COMPARISON:  None.  FINDINGS: RIGHT LOWER EXTREMITY  Common Femoral Vein: No evidence of thrombus. Normal compressibility, respiratory phasicity and response to augmentation.  Saphenofemoral Junction: No evidence of thrombus. Normal compressibility and flow on color Doppler imaging.  Profunda Femoral Vein: No evidence of thrombus. Normal compressibility and flow on color Doppler imaging.  Femoral Vein: No evidence of thrombus. Normal compressibility, respiratory phasicity and response to augmentation.  Popliteal Vein: No evidence of thrombus. Normal compressibility, respiratory phasicity and response to augmentation.  Calf Veins: No evidence of thrombus. Normal compressibility and flow on color Doppler imaging.  Superficial Great Saphenous Vein: No evidence of thrombus. Normal compressibility.  Venous Reflux:  None.  Other Findings:  None.  LEFT LOWER EXTREMITY  Common Femoral Vein: No evidence of thrombus. Normal compressibility, respiratory  phasicity and response to augmentation.  Saphenofemoral Junction: No evidence of thrombus. Normal compressibility and flow on color Doppler imaging.  Profunda Femoral Vein: No evidence of thrombus. Normal compressibility and flow on color Doppler imaging.  Femoral Vein: No evidence of thrombus. Normal compressibility, respiratory phasicity and response to augmentation.  Popliteal Vein: No evidence of thrombus. Normal compressibility, respiratory phasicity and response to augmentation.  Calf Veins: No evidence of thrombus. Normal compressibility and flow on color Doppler imaging.  Superficial Great Saphenous Vein: No evidence of thrombus. Normal compressibility.  Venous Reflux:  None.  Other Findings:  None.  IMPRESSION: No evidence of deep venous  thrombosis in either lower extremity.   Electronically Signed By: Randa Ngo M.D. On: 08/10/2019 16:09  No results found for this or any previous visit.  No results found for this or any previous visit.  Results for orders placed during the hospital encounter of 10/15/18  US RENAL  Narrative CLINICAL DATA:  Chronic kidney disease  EXAM: RENAL / URINARY TRACT ULTRASOUND COMPLETE  COMPARISON:  CT dated April 24, 2018.  FINDINGS: Right Kidney:  Renal measurements: 7.3 x 3.4 x 4.4 cm = volume: 57.9 mL. There is a solid mass in the interpolar region measuring 3.2 x 3.6 x 3.2 cm. There is no hydronephrosis. No shadowing echogenic kidney stones.  Left Kidney:  Renal measurements: 10.1 x 3.9 x 5 cm = volume: 103 mL. Echogenicity within normal limits. No mass or hydronephrosis visualized.  Bladder:  Bladder was underdistended which severely limits evaluation.  IMPRESSION: 1. Solid right renal mass measuring approximately 3.6 cm. This is concerning for renal cell carcinoma until proven otherwise. Urologic follow-up is recommended. 2. No hydronephrosis. 3. Underdistended urinary bladder which limits evaluation.   Electronically Signed By: Constance Holster M.D. On: 10/15/2018 19:23  No results found for this or any previous visit.  No results found for this or any previous visit.  No results found for this or any previous visit.   Assessment & Plan:    1. Right renal mass -Repeat CT abd wo contrast. If it is stable in size I will see her back in 6 months with renal US   No follow-ups on file.  Nicolette Bang, MD  [facility] [place]

## 2019-12-22 NOTE — Patient Instructions (Signed)
Renal Mass  A renal mass is a growth in the kidney. A renal mass may be found while performing an MRI, CT scan, or ultrasound for other problems of the abdomen. Certain types of cancers, infections, or injuries can cause a renal mass. A renal mass that is cancerous (malignant) may grow or spread quickly. Others are harmless (benign). What are common types of renal masses? Renal masses include:  Tumors. These may be cancerous (malignant) or noncancerous (benign). ? The most common type of kidney cancer is renal cell carcinoma. ? The most common benign tumors of the kidney include renal adenomas, oncocytomas, and angiomyolipoma (AML).  Cysts. These are fluid-filled sacs that form on or in the kidney. ? It is not always known what causes a cyst to develop in or on the kidney. ? Most kidney cysts do not cause symptoms and do not need to be treated. What type of testing might I need? Your health care provider may recommend that you have tests to diagnose the cause of your renal mass. The following tests may be done if a renal mass is found:  Physical exam.  Blood tests.  Urine tests.  Imaging tests, such as ultrasound, CT scan, or MRI.  Biopsy. This is a small sample that is removed from the renal mass and tested in a lab. The exact tests and how often they are done will depend on:  The size and appearance of the renal mass.  Risk factors or medical conditions that increase your risk for problems.  Any symptoms associated with the renal mass, or concerns that you have about it. Tests and physical exams may be done once, or they may be done regularly for a period of time. Tests and exams that are done regularly will help monitor whether the mass is growing and beginning to cause problems. What are common treatments for renal masses? Treatment is not always needed for this condition. Your health care provider may recommend careful monitoring (watchful waiting) and regular tests and exams.  Treatment will depend on the cause of the mass. Follow these instructions at home: What you need to do at home will depend on the cause of the mass. Follow the instructions that your health care provider gives to you. In general:  Take over-the-counter and prescription medicines only as told by your health care provider.  If you are prescribed an antibiotic medicine, take it as told by your health care provider. Do not stop taking the antibiotic even if you start to feel better.  Follow any restrictions that are given to you by your health care provider.  Keep all follow-up visits as told by your health care provider. This is important. ? You may need to see your health care provider once or twice a year to have CT scans and ultrasounds done. These tests will show if your renal mass has changed or grown bigger. Contact a health care provider if you:  Have pain in the side or back (flank pain).  Have a fever.  Feel full soon after eating.  Have pain or swelling in the abdomen.  Lose weight. Get help right away if:  Your pain gets worse.  There is blood in your urine.  You cannot urinate.  You have chest pain.  You have trouble breathing. Summary  A renal mass is a growth in the kidney. It may be cancerous (malignant) and grow or spread quickly, or it may be harmless (benign).  Renal masses may be found while performing   an MRI, CT scan, or ultrasound for other problems of the abdomen.  Your health care provider may recommend that you have tests to diagnose the cause of your renal mass. This may include a physical exam, blood tests, urine tests, imaging, or a biopsy.  Treatment is not always needed for this condition. Careful monitoring (watchful waiting) may be recommended. This information is not intended to replace advice given to you by your health care provider. Make sure you discuss any questions you have with your health care provider. Document Revised: 05/01/2017  Document Reviewed: 05/01/2017 Elsevier Patient Education  2020 Elsevier Inc.  

## 2019-12-22 NOTE — Addendum Note (Signed)
Addended by: Valentina Lucks on: 12/22/2019 11:53 AM   Modules accepted: Orders

## 2019-12-22 NOTE — Progress Notes (Signed)
Urological Symptom Review  Patient is experiencing the following symptoms: none   Review of Systems  Gastrointestinal (upper)  : Negative for upper GI symptoms  Gastrointestinal (lower) : Negative for lower GI symptoms  Constitutional : Negative for symptoms  Skin: Negative for skin symptoms  Eyes: Blurred vision  Ear/Nose/Throat : Negative for Ear/Nose/Throat symptoms  Hematologic/Lymphatic: Swollen lymph nodes  Cardiovascular : Negative for cardiovascular symptoms  Respiratory : Negative for respiratory symptoms  Endocrine: Negative for endocrine symptoms  Musculoskeletal: Negative for musculoskeletal symptoms  Neurological: Negative for neurological symptoms  Psychologic: Negative for psychiatric symptoms

## 2019-12-27 ENCOUNTER — Ambulatory Visit (HOSPITAL_COMMUNITY): Payer: Medicare HMO | Admitting: Hematology

## 2019-12-30 ENCOUNTER — Ambulatory Visit (HOSPITAL_COMMUNITY): Payer: Medicare HMO

## 2020-01-04 ENCOUNTER — Other Ambulatory Visit: Payer: Self-pay | Admitting: Family Medicine

## 2020-01-04 DIAGNOSIS — F0390 Unspecified dementia without behavioral disturbance: Secondary | ICD-10-CM

## 2020-01-04 NOTE — Telephone Encounter (Signed)
Britney. NTBS 30 days given 8/30/2

## 2020-01-05 ENCOUNTER — Other Ambulatory Visit: Payer: Self-pay | Admitting: Family Medicine

## 2020-01-05 DIAGNOSIS — F0390 Unspecified dementia without behavioral disturbance: Secondary | ICD-10-CM

## 2020-01-06 ENCOUNTER — Ambulatory Visit: Payer: Medicare HMO | Admitting: Cardiology

## 2020-01-06 ENCOUNTER — Ambulatory Visit (HOSPITAL_COMMUNITY): Payer: Medicare HMO

## 2020-01-06 NOTE — Telephone Encounter (Signed)
Left message for patient to call back and schedule a appointment for medication refill.

## 2020-01-13 ENCOUNTER — Ambulatory Visit (INDEPENDENT_AMBULATORY_CARE_PROVIDER_SITE_OTHER): Payer: Medicare HMO | Admitting: Family Medicine

## 2020-01-13 DIAGNOSIS — I10 Essential (primary) hypertension: Secondary | ICD-10-CM

## 2020-01-13 DIAGNOSIS — E0590 Thyrotoxicosis, unspecified without thyrotoxic crisis or storm: Secondary | ICD-10-CM

## 2020-01-13 DIAGNOSIS — N2889 Other specified disorders of kidney and ureter: Secondary | ICD-10-CM | POA: Diagnosis not present

## 2020-01-13 DIAGNOSIS — N1832 Chronic kidney disease, stage 3b: Secondary | ICD-10-CM

## 2020-01-13 DIAGNOSIS — R7303 Prediabetes: Secondary | ICD-10-CM

## 2020-01-13 DIAGNOSIS — F0390 Unspecified dementia without behavioral disturbance: Secondary | ICD-10-CM

## 2020-01-13 DIAGNOSIS — C641 Malignant neoplasm of right kidney, except renal pelvis: Secondary | ICD-10-CM | POA: Diagnosis not present

## 2020-01-13 DIAGNOSIS — E042 Nontoxic multinodular goiter: Secondary | ICD-10-CM

## 2020-01-13 MED ORDER — DONEPEZIL HCL 10 MG PO TABS: 10.0000 mg | ORAL_TABLET | Freq: Every day | ORAL | 1 refills | Status: DC

## 2020-01-13 MED ORDER — AMLODIPINE BESYLATE 10 MG PO TABS: 10.0000 mg | ORAL_TABLET | Freq: Every day | ORAL | 2 refills | Status: DC

## 2020-01-13 NOTE — Progress Notes (Signed)
Virtual Visit via Telephone Note  I connected with Erin Jordan on 01/13/20 at 4:51 PM by telephone and verified that I am speaking with the correct person using two identifiers. Erin Jordan is currently located at home and her granddaughter is currently with her during this visit. The provider, [name], FNP is located in their office at time of visit.  I discussed the limitations, risks, security and privacy concerns of performing an evaluation and management service by telephone and the availability of in person appointments. I also discussed with the patient that there may be a patient responsible charge related to this service. The patient expressed understanding and agreed to proceed.  Subjective: PCP: [name], FNP  Chief Complaint  Patient presents with  . Medical Management of Chronic Issues   Hypertension: Patient is following-up of elevated blood pressure. Patient has not been checking her BP at home, but it has been elevated at doctors visits. Cardiac symptoms none. Cardiovascular risk factors: advanced age (older than 54 for men, 58 for women), diabetes mellitus, dyslipidemia and hypertension. Use of agents associated with hypertension: none. History of target organ damage: angina/ prior myocardial infarction and chronic kidney disease.  Renal Cell Carcinoma: patient saw urologist, Dr. Alyson Ingles, last month for a follow-up. The current plan is to repeat her CT scan and if the mass is stable in size, he will see her back in 6 months. The granddaughter reports they are currently waiting on insurance to approve the CT scan.   Hyperthyroidism/Multinodular Goiter: there has been no change in medication. Patient is still in need of surgery which was originally on hold due to the mass on her kidney. She was last seen at the beginning of this year by endocrinology per the granddaughter.   Dementia: patient is seeing people who are no longer alive (her husband) and  children.    ROS: Per HPI  Current Outpatient Medications:  .  amLODipine (NORVASC) 5 MG tablet, Take 1 tablet (5 mg total) by mouth daily., Disp: 90 tablet, Rfl: 1 .  atorvastatin (LIPITOR) 80 MG tablet, Take 1 tablet (80 mg total) by mouth daily., Disp: 90 tablet, Rfl: 1 .  calcium carbonate (OSCAL) 1500 (600 Ca) MG TABS tablet, Take 1,500 mg by mouth daily with breakfast. , Disp: , Rfl:  .  cholecalciferol (VITAMIN D) 25 MCG (1000 UT) tablet, Take 1,000 Units by mouth daily. , Disp: , Rfl:  .  donepezil (ARICEPT) 10 MG tablet, Take 1 tablet (10 mg total) by mouth at bedtime. (Needs to be seen before next refill), Disp: 30 tablet, Rfl: 0 .  methimazole (TAPAZOLE) 5 MG tablet, TAKE ONE TABLET BY MOUTH DAILY., Disp: 30 tablet, Rfl: 2 .  metoprolol succinate (TOPROL XL) 25 MG 24 hr tablet, Take 1 tablet (25 mg total) by mouth daily., Disp: 90 tablet, Rfl: 3 .  nitroGLYCERIN (NITROSTAT) 0.4 MG SL tablet, Place 1 tablet (0.4 mg total) under the tongue every 5 (five) minutes x 3 doses as needed for chest pain., Disp: 25 tablet, Rfl: 3  No Known Allergies Past Medical History:  Diagnosis Date  . Arthritis   . Cancer of kidney ([place])   . Chronic kidney disease, stage 3, mod decreased GFR   . Coronary atherosclerosis of native coronary artery 2006   Multivessel status post CABG in [place], graft disease documented March 2019 with DES to SVG to diagonal  . Essential hypertension   . 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  History of diabetes mellitus, type II   . Hyperlipidemia   . NSTEMI (non-ST elevated myocardial infarction) ([place]) 06/24/2017  . Renal hematoma   . Right renal mass     Observations/Objective: A&O  No respiratory distress or wheezing audible over the phone Mood, judgement, and thought processes all WNL   Assessment and Plan: 1. Essential hypertension - Uncontrolled. Amlodipine increased from 5 mg to 10 mg once daily. Diet and exercise encouraged.  -  amLODipine (NORVASC) 10 MG tablet; Take 1 tablet (10 mg total) by mouth daily.  Dispense: 30 tablet; Refill: 2 - CBC with Differential/Platelet; Future - CMP14+EGFR; Future - Lipid panel; Future  2-3. Renal cell carcinoma of right kidney (HCC)/Right renal mass - Managed by urology. Plan is to repeat CT scan which they are waiting on insurance approval for.   4-5. Hyperthyroidism/Multinodular goiter - Encouraged to follow-up with endocrinology.  - Thyroid Panel With TSH; Future  6. Dementia without behavioral disturbance, unspecified dementia type ([place]) - Hallucinating. Discussed could still be due to thyroid. Offered referral to neurology but they decline at this time. Continue current regimen.  - donepezil (ARICEPT) 10 MG tablet; Take 1 tablet (10 mg total) by mouth at bedtime.  Dispense: 90 tablet; Refill: 1  7. Stage 3b chronic kidney disease ([place]) - CMP14+EGFR; Future  8. Prediabetes - Microalbumin / creatinine urine ratio; Future - Bayer DCA Hb A1c Waived; Future   Follow Up Instructions: Return in about 6 weeks (around 02/24/2020) for HTN.  I discussed the assessment and treatment plan with the patient. The patient was provided an opportunity to ask questions and all were answered. The patient agreed with the plan and demonstrated an understanding of the instructions.   The patient was advised to call back or seek an in-person evaluation if the symptoms worsen or if the condition fails to improve as anticipated.  The above assessment and management plan was discussed with the patient. The patient verbalized understanding of and has agreed to the management plan. Patient is aware to call the clinic if symptoms persist or worsen. Patient is aware when to return to the clinic for a follow-up visit. Patient educated on when it is appropriate to go to the emergency department.   Time call ended: 5:14 PM  I provided 25 minutes of non-face-to-face time during this  encounter.  Hendricks Limes, MSN, APRN, FNP-[facility] 01/13/20

## 2020-01-15 ENCOUNTER — Encounter: Payer: Self-pay | Admitting: Family Medicine

## 2020-01-21 ENCOUNTER — Encounter (INDEPENDENT_AMBULATORY_CARE_PROVIDER_SITE_OTHER): Payer: Self-pay

## 2020-01-21 ENCOUNTER — Other Ambulatory Visit: Payer: Self-pay

## 2020-01-21 ENCOUNTER — Other Ambulatory Visit: Payer: Medicare HMO

## 2020-01-21 DIAGNOSIS — R7303 Prediabetes: Secondary | ICD-10-CM

## 2020-01-21 DIAGNOSIS — E0590 Thyrotoxicosis, unspecified without thyrotoxic crisis or storm: Secondary | ICD-10-CM

## 2020-01-21 DIAGNOSIS — E042 Nontoxic multinodular goiter: Secondary | ICD-10-CM

## 2020-01-21 DIAGNOSIS — I10 Essential (primary) hypertension: Secondary | ICD-10-CM

## 2020-01-21 DIAGNOSIS — N1832 Chronic kidney disease, stage 3b: Secondary | ICD-10-CM

## 2020-01-21 LAB — BAYER DCA HB A1C WAIVED: HB A1C (BAYER DCA - WAIVED): 6.1 % (ref <7.0)

## 2020-01-22 LAB — CMP14+EGFR
ALT: 11 IU/L (ref 0–32)
AST: 13 IU/L (ref 0–40)
Albumin/Globulin Ratio: 1.8 (ref 1.2–2.2)
Albumin: 4.2 g/dL (ref 3.7–4.7)
Alkaline Phosphatase: 101 IU/L (ref 44–121)
BUN/Creatinine Ratio: 23 (ref 12–28)
BUN: 46 mg/dL — ABNORMAL HIGH (ref 8–27)
Bilirubin Total: 0.3 mg/dL (ref 0.0–1.2)
CO2: 22 mmol/L (ref 20–29)
Calcium: 9.4 mg/dL (ref 8.7–10.3)
Chloride: 105 mmol/L (ref 96–106)
Creatinine, Ser: 2.04 mg/dL — ABNORMAL HIGH (ref 0.57–1.00)
GFR calc Af Amer: 27 mL/min/{1.73_m2} — ABNORMAL LOW (ref >59)
GFR calc non Af Amer: 23 mL/min/{1.73_m2} — ABNORMAL LOW (ref >59)
Globulin, Total: 2.3 g/dL (ref 1.5–4.5)
Glucose: 125 mg/dL — ABNORMAL HIGH (ref 65–99)
Potassium: 3.7 mmol/L (ref 3.5–5.2)
Sodium: 142 mmol/L (ref 134–144)
Total Protein: 6.5 g/dL (ref 6.0–8.5)

## 2020-01-22 LAB — CBC WITH DIFFERENTIAL/PLATELET
Basophils Absolute: 0 10*3/uL (ref 0.0–0.2)
Basos: 0 %
EOS (ABSOLUTE): 0.2 10*3/uL (ref 0.0–0.4)
Eos: 2 %
Hematocrit: 37.7 % (ref 34.0–46.6)
Hemoglobin: 12.4 g/dL (ref 11.1–15.9)
Immature Grans (Abs): 0 10*3/uL (ref 0.0–0.1)
Immature Granulocytes: 0 %
Lymphocytes Absolute: 2 10*3/uL (ref 0.7–3.1)
Lymphs: 24 %
MCH: 27.4 pg (ref 26.6–33.0)
MCHC: 32.9 g/dL (ref 31.5–35.7)
MCV: 83 fL (ref 79–97)
Monocytes Absolute: 0.3 10*3/uL (ref 0.1–0.9)
Monocytes: 4 %
Neutrophils Absolute: 5.8 10*3/uL (ref 1.4–7.0)
Neutrophils: 70 %
Platelets: 392 10*3/uL (ref 150–450)
RBC: 4.52 x10E6/uL (ref 3.77–5.28)
RDW: 13.8 % (ref 11.7–15.4)
WBC: 8.4 10*3/uL (ref 3.4–10.8)

## 2020-01-22 LAB — MICROALBUMIN / CREATININE URINE RATIO
Creatinine, Urine: 185.2 mg/dL
Microalb/Creat Ratio: 192 mg/g creat — ABNORMAL HIGH (ref 0–29)
Microalbumin, Urine: 355.3 ug/mL

## 2020-01-22 LAB — LIPID PANEL
Chol/HDL Ratio: 2.7 ratio (ref 0.0–4.4)
Cholesterol, Total: 177 mg/dL (ref 100–199)
HDL: 66 mg/dL (ref >39)
LDL Chol Calc (NIH): 91 mg/dL (ref 0–99)
Triglycerides: 113 mg/dL (ref 0–149)
VLDL Cholesterol Cal: 20 mg/dL (ref 5–40)

## 2020-01-22 LAB — THYROID PANEL WITH TSH
Free Thyroxine Index: 1.5 (ref 1.2–4.9)
T3 Uptake Ratio: 23 % — ABNORMAL LOW (ref 24–39)
T4, Total: 6.4 ug/dL (ref 4.5–12.0)
TSH: 1.16 u[IU]/mL (ref 0.450–4.500)

## 2020-01-26 ENCOUNTER — Ambulatory Visit: Payer: Self-pay | Admitting: Surgery

## 2020-01-26 ENCOUNTER — Telehealth: Payer: Self-pay | Admitting: *Deleted

## 2020-01-26 NOTE — Telephone Encounter (Signed)
   [facility] Pre-operative Risk Assessment    HEARTCARE STAFF: - Please ensure there is not already an duplicate clearance open for this procedure. - Under Visit Info/Reason for Call, type in Other and utilize the format Clearance MM/DD/YY or Clearance TBD. Do not use dashes or single digits. - If request is for dental extraction, please clarify the # of teeth to be extracted.  Request for surgical clearance:  1. What type of surgery is being performed? TOTAL THYROIDECTOMY    2. When is this surgery scheduled? TBD   3. What type of clearance is required (medical clearance vs. Pharmacy clearance to hold med vs. Both)? MEDICAL  4. Are there any medications that need to be held prior to surgery and how long? NONE LISTED    5. Practice name and name of physician performing surgery? [facility]; DR. Sherren Mocha GERKIN   6. What is the office phone number? [phone]   7.   What is the office fax number? [place]: Erin Lorenzo, Erin Jordan  8.   Anesthesia type (None, local, MAC, general) ? GENERAL    Julaine Hua 01/26/2020, 12:34 PM  _________________________________________________________________   (provider comments below)

## 2020-01-26 NOTE — Telephone Encounter (Signed)
   Primary Cardiologist: Rozann Lesches, MD  Chart reviewed as part of pre-operative protocol coverage. Because of Erin Jordan's past medical history and time since last visit, she will require a follow-up visit in order to better assess preoperative cardiovascular risk.  Pre-op covering staff: - Please schedule appointment and call patient to inform them. If patient already had an upcoming appointment within acceptable timeframe, please add "pre-op clearance" to the appointment notes so provider is aware. - Please contact requesting surgeon's office via preferred method (i.e, phone, fax) to inform them of need for appointment prior to surgery.  If applicable, this message will also be routed to pharmacy pool and/or primary cardiologist for input on holding anticoagulant/antiplatelet agent as requested below so that this information is available to the clearing provider at time of patient's appointment.   Can we see if her appt with Bernerd Pho can be moved to sooner?  Erin Lin Caraline Deutschman, PA  01/26/2020, 1:49 PM

## 2020-01-27 NOTE — Telephone Encounter (Signed)
Pt has appt with Bernerd Pho, [facility] 03/01/20 for pre op clearance. Will send FYI to the requesting office pt has appt 03/01/20. Will remove from the pre op call back pool.

## 2020-02-20 NOTE — Progress Notes (Signed)
Cardiology Clinic Note   Patient Name: Erin Jordan Date of Encounter: 02/21/2020  Primary Care Provider:  [name], FNP Primary Cardiologist:  Rozann Lesches, MD  Patient Profile    Erin Jordan 74 year old female presents the clinic today for follow-up evaluation of her essential hypertension and coronary artery disease.  She also presents for preoperative cardiac evaluation for total thyroidectomy.  Past Medical History    Past Medical History:  Diagnosis Date  . Arthritis   . Cancer of kidney ([place])   . Chronic kidney disease, stage 3, mod decreased GFR (HCC)   . Coronary atherosclerosis of native coronary artery 2006   Multivessel status post CABG in [place], graft disease documented March 2019 with DES to SVG to diagonal  . Essential hypertension   . 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 History of diabetes mellitus, type II   . Hyperlipidemia   . NSTEMI (non-ST elevated myocardial infarction) ([place]) 06/24/2017  . Renal hematoma   . Right renal mass    Past Surgical History:  Procedure Laterality Date  . ABDOMINAL HYSTERECTOMY    . CORONARY ARTERY BYPASS GRAFT  2006   [place], [place]  . CORONARY STENT INTERVENTION N/A 06/25/2017   Procedure: CORONARY STENT INTERVENTION;  Surgeon: Jettie Booze, MD;  Location: [facility];  Service: Cardiovascular;  Laterality: N/A;  . IR RADIOLOGIST EVAL & MGMT  07/08/2019  . LEFT HEART CATH AND CORS/GRAFTS ANGIOGRAPHY N/A 06/25/2017   Procedure: LEFT HEART CATH AND CORS/GRAFTS ANGIOGRAPHY;  Surgeon: Jettie Booze, MD;  Location: [facility];  Service: Cardiovascular;  Laterality: N/A;    Allergies  No Known Allergies  History of Present Illness    Erin Jordan has a PMH of right renal mass, NSTEMI, hyperlipidemia, diabetes mellitus type 2, essential hypertension, CKD stage III, and arthritis.  She underwent cardiac catheterization with PCI 3/19.  (Proximal LAD-mid LAD 100%  stenosed.  LIMA-LAD patent, first marginal lesion 95% stenosed OST first marginal-first marginal lesion 95% stenosed.  SVG-OM occluded.  Proximal RCA lesion 100% stenosed SVG-PDA occluded, OST second diagonal to second diagonal lesion 100% stenosed.  SVG to diagonal with ostial 75% lesion.  DES successfully placed.  Post intervention 0% residual stenosis.  LVEF 50-55%.  No aortic stenosis visualized.  She was last seen by Dr. Domenic Polite on 07/07/2019.  She was doing well at that time.  She denied chest pain.  Her blood pressure was better controlled on her medications Norvasc and metoprolol were continued.  She presents to the clinic today for follow-up evaluation and preoperative cardiac evaluation.  She states she feels well today.  She was recently seen by ENT who are recommending thyroidectomy.  She does have a prominent thyroid mass on the left side of her neck.  Her daughter states that she has been fairly active at home helping with housework.  She does have dietary indiscretion and enjoys fried/salty type foods.  Her blood pressure is better controlled with the addition of amlodipine however it has continued to be in the 150s over 70s.  I will give her a blood pressure log, salty 6, increase her metoprolol to 37.5 and have her follow-up in 1 month.  She is acceptable risk for her thyroidectomy.  Today she denies chest pain, shortness of breath, lower extremity edema, fatigue, palpitations, melena, hematuria, hemoptysis, diaphoresis, weakness, presyncope, syncope, orthopnea, and PND.   Home Medications    Prior to Admission medications   Medication Sig Start  Date End Date Taking? Authorizing Provider  amLODipine (NORVASC) 10 MG tablet Take 1 tablet (10 mg total) by mouth daily. 01/13/20   [name], FNP  atorvastatin (LIPITOR) 80 MG tablet Take 1 tablet (80 mg total) by mouth daily. 08/19/19   [name], FNP  calcium carbonate (OSCAL) 1500 (600 Ca) MG TABS tablet Take 1,500 mg by mouth  daily with breakfast.     [provider]  cholecalciferol (VITAMIN D) 25 MCG (1000 UT) tablet Take 1,000 Units by mouth daily.     [provider]  donepezil (ARICEPT) 10 MG tablet Take 1 tablet (10 mg total) by mouth at bedtime. 01/13/20   [name], FNP  methimazole (TAPAZOLE) 5 MG tablet TAKE ONE TABLET BY MOUTH DAILY. 12/06/19   [name], FNP  metoprolol succinate (TOPROL XL) 25 MG 24 hr tablet Take 1 tablet (25 mg total) by mouth daily. 06/24/19   Strader, Fransisco Hertz, PA-C  nitroGLYCERIN (NITROSTAT) 0.4 MG SL tablet Place 1 tablet (0.4 mg total) under the tongue every 5 (five) minutes x 3 doses as needed for chest pain. 07/07/19   Satira Sark, MD    Family History    Family History  Problem Relation Age of Onset  . Heart attack Mother   . Hypertension Mother   . Seizures Son   . Seizures Son   . Seizures Daughter    She indicated that her mother is deceased. She indicated that her father is deceased. She indicated that the status of her daughter is unknown.  Social History    Social History   Socioeconomic History  . Marital status: Widowed    Spouse name: Not on file  . Number of children: 6  . Years of education: Not on file  . Highest education level: Not on file  Occupational History  . Occupation: retired  Tobacco Use  . Smoking status: Never Smoker  . Smokeless tobacco: Never Used  Vaping Use  . Vaping Use: Never used  Substance and Sexual Activity  . Alcohol use: No  . Drug use: No  . Sexual activity: Never  Other Topics Concern  . Not on file  Social History Narrative   Lives in [place] with her daughter and grandchildren.   Social Determinants of Health   Financial Resource Strain:   . Difficulty of Paying Living Expenses: Not on file  Food Insecurity:   . Worried About Charity fundraiser in the Last Year: Not on file  . Ran Out of Food in the Last Year: Not on file  Transportation Needs:   . Lack of  Transportation (Medical): Not on file  . Lack of Transportation (Non-Medical): Not on file  Physical Activity:   . Days of Exercise per Week: Not on file  . Minutes of Exercise per Session: Not on file  Stress:   . Feeling of Stress : Not on file  Social Connections:   . Frequency of Communication with Friends and Family: Not on file  . Frequency of Social Gatherings with Friends and Family: Not on file  . Attends Religious Services: Not on file  . Active Member of Clubs or Organizations: Not on file  . Attends Archivist Meetings: Not on file  . Marital Status: Not on file  Intimate Partner Violence:   . Fear of Current or Ex-Partner: Not on file  . Emotionally Abused: Not on file  . Physically Abused: Not on file  . Sexually  Abused: Not on file     Review of Systems    General:  No chills, fever, night sweats or weight changes.  Cardiovascular:  No chest pain, dyspnea on exertion, edema, orthopnea, palpitations, paroxysmal nocturnal dyspnea. Dermatological: No rash, lesions/masses Respiratory: No cough, dyspnea Urologic: No hematuria, dysuria Abdominal:   No nausea, vomiting, diarrhea, bright red blood per rectum, melena, or hematemesis Neurologic:  No visual changes, wkns, changes in mental status. All other systems reviewed and are otherwise negative except as noted above.  Physical Exam    VS:  BP (!) 156/70   Pulse 70   Ht 5\' 2"  (1.575 m)   Wt 108 lb (49 kg)   SpO2 100%   BMI 19.75 kg/m  , BMI Body mass index is 19.75 kg/m. GEN: Well nourished, well developed, in no acute distress. HEENT: normal.  Thyroid enlargement left neck Neck: Supple, no JVD, carotid bruits, or masses. Cardiac: RRR, no murmurs, rubs, or gallops. No clubbing, cyanosis, edema.  Radials/DP/PT 2+ and equal bilaterally.  Respiratory:  Respirations regular and unlabored, clear to auscultation bilaterally. GI: Soft, nontender, nondistended, BS + x 4. MS: no deformity or atrophy. Skin:  warm and dry, no rash. Neuro:  Strength and sensation are intact. Psych: Normal affect.  Accessory Clinical Findings    Recent Labs: 10/14/2019: Magnesium 2.2 01/21/2020: ALT 11; BUN 46; Creatinine, Ser 2.04; Hemoglobin 12.4; Platelets 392; Potassium 3.7; Sodium 142; TSH 1.160   Recent Lipid Panel    Component Value Date/Time   CHOL 177 01/21/2020 1637   TRIG 113 01/21/2020 1637   HDL 66 01/21/2020 1637   CHOLHDL 2.7 01/21/2020 1637   CHOLHDL 4.4 06/02/2011 0431   VLDL 18 06/02/2011 0431   LDLCALC 91 01/21/2020 1637    ECG personally reviewed by me today-none today.  Echocardiogram 06/03/2011 Study Conclusions   - Left ventricle: The cavity size was normal.Mild tomoderate  LVH with disproportionate septal thickening. Systolic  function was normal. The estimated ejection fraction was  in the range of 60% to 65%. Wall motion was normal; there  were no regional wall motion abnormalities.  - Aortic valve: Mildly calcified annulus.  - Mitral valve: Calcified annulus.  - Right ventricle: The cavity size was normal. Wall  thickness was mildly to moderately increased.  - Atrial septum: No defect or patent foramen ovale was  identified.    Cardiac catheterization 06/25/2017  Ost 1st Diag to 1st Diag lesion is 80% stenosed. THis is a long lesion nad is not grafted.  Prox LAD to Mid LAD lesion is 100% stenosed. LIMA to LAD is patent.  Lat 1st Mrg lesion is 95% stenosed.  Ost 1st Mrg to 1st Mrg lesion is 95% stenosed. SVG to OM is occluded.  Prox RCA lesion is 100% stenosed. SVG to PDA is occluded. This appears recent.  Ost 2nd Diag to 2nd Diag lesion is 100% stenosed. SVG to Diagonal with ostial 75% lesion.  A drug-eluting stent was successfully placed using a STENT SIERRA 4.00 X 15 MM.  Post intervention, there is a 0% residual stenosis.  The left ventricular ejection fraction is 50-55% by visual estimate.  There is no aortic valve stenosis.   Continue  aggressive medical therapy.  Complex circumflex bifurcation lesion.  Increase antianginals.  Would likely lose the large lateral branch of the OM1 if PCI were performed.    Assessment & Plan   1.  Coronary artery disease status post CABG-no chest pain today.  No recent episodes of chest  discomfort, arm or neck pain..  Underwent cardiac catheterization 3/19 and received DES x1 to her SVG-D1 graft. Continue aspirin, amlodipine, atorvastatin, metoprolol, nitroglycerin Heart healthy low-sodium diet-salty 6 given Increase physical activity as tolerated  Essential hypertension-BP today 156/70.  Has had better control since increasing amlodipine.  However, blood pressure still remained in the 150s over 70s. Continue amlodipine Increase metoprolol to 37.5 mg daily Heart healthy low-sodium diet-salty 6 given Increase physical activity as tolerated Blood pressure log given.  Mixed hyperlipidemia-LDL 91 on 01/21/2020.  Goal LDL less than 70. Continue atorvastatin Heart healthy low-sodium high-fiber diet Increase physical activity as tolerated  Preoperative cardiac evaluation-total thyroidectomy Dr. Janie Morning surgery     Primary Cardiologist: Rozann Lesches, MD  Chart reviewed as part of pre-operative protocol coverage. Given past medical history and time since last visit, based on ACC/AHA guidelines, Erin Jordan would be at acceptable risk for the planned procedure without further cardiovascular testing.   Her RCRI is a class III risk, 6.6% risk of major cardiac event.  She is able to complete greater than 4 METS of physical activity.  Patient was advised that if she develops new symptoms prior to surgery to contact our office to arrange a follow-up appointment.  He verbalized understanding.  I will route this recommendation to the requesting party via Epic fax function and remove from pre-op pool.  Please call with questions.   Disposition: Follow-up with Dr.  Domenic Polite in 1 months.  Jossie Ng. Jaire Pinkham NP-C    02/21/2020, 8:38 AM [address] Office [phone] Fax [phone]  Notice: This dictation was prepared with Dragon dictation along with smaller phrase technology. Any transcriptional errors that result from this process are unintentional and may not be corrected upon review.

## 2020-02-21 ENCOUNTER — Ambulatory Visit (INDEPENDENT_AMBULATORY_CARE_PROVIDER_SITE_OTHER): Payer: Medicare HMO | Admitting: General Practice

## 2020-02-21 ENCOUNTER — Other Ambulatory Visit: Payer: Self-pay

## 2020-02-21 ENCOUNTER — Encounter: Payer: Self-pay | Admitting: General Practice

## 2020-02-21 VITALS — BP 156/70 | HR 70 | Ht 62.0 in | Wt 108.0 lb

## 2020-02-21 DIAGNOSIS — Z01810 Encounter for preprocedural cardiovascular examination: Secondary | ICD-10-CM

## 2020-02-21 DIAGNOSIS — I25119 Atherosclerotic heart disease of native coronary artery with unspecified angina pectoris: Secondary | ICD-10-CM | POA: Diagnosis not present

## 2020-02-21 DIAGNOSIS — I10 Essential (primary) hypertension: Secondary | ICD-10-CM

## 2020-02-21 DIAGNOSIS — E782 Mixed hyperlipidemia: Secondary | ICD-10-CM

## 2020-02-21 MED ORDER — METOPROLOL SUCCINATE ER 25 MG PO TB24: 37.5000 mg | ORAL_TABLET | Freq: Every day | ORAL | 3 refills | Status: DC

## 2020-02-21 NOTE — Patient Instructions (Signed)
Medication Instructions:  INCREASE Toprol XL to 37.5 mg daily    *If you need a refill on your cardiac medications before your next appointment, please call your pharmacy*   Lab Work: None today If you have labs (blood work) drawn today and your tests are completely normal, you will receive your results only by: Marland Kitchen MyChart Message (if you have MyChart) OR . A paper copy in the mail If you have any lab test that is abnormal or we need to change your treatment, we will call you to review the results.   Testing/Procedures: None today   Follow-Up: At [facility], you and your health needs are our priority.  As part of our continuing mission to provide you with exceptional heart care, we have created designated Provider Care Teams.  These Care Teams include your primary Cardiologist (physician) and Advanced Practice Providers (APPs -  Physician Assistants and Nurse Practitioners) who all work together to provide you with the care you need, when you need it.  We recommend signing up for the patient portal called "MyChart".  Sign up information is provided on this After Visit Summary.  MyChart is used to connect with patients for Virtual Visits (Telemedicine).  Patients are able to view lab/test results, encounter notes, upcoming appointments, etc.  Non-urgent messages can be sent to your provider as well.   To learn more about what you can do with MyChart, go to [email or web address].    Your next appointment:   1 month(s)  The format for your next appointment:   In Person  Provider:   Rozann Lesches, MD, Bernerd Pho, PA-C or Ermalinda Barrios, PA-C   Other Instructions Increase activity as tolerated    Follow [facility] [place] sodium guidelines I provided for you today.   Keep BP log daily with blood pressure and heart rate

## 2020-03-01 ENCOUNTER — Other Ambulatory Visit: Payer: Self-pay | Admitting: Family Medicine

## 2020-03-01 ENCOUNTER — Ambulatory Visit: Payer: Medicare HMO | Admitting: Student

## 2020-03-01 DIAGNOSIS — E042 Nontoxic multinodular goiter: Secondary | ICD-10-CM

## 2020-03-22 NOTE — Progress Notes (Signed)
Cardiology Office Note    Date:  03/23/2020   ID:  Erin Jordan, DOB January 05, 1946, MRN [id number]  PCP:  [name], FNP  Cardiologist:  Rozann Lesches, MD  Electrophysiologist:  None   Chief Complaint: f/u HTN  History of Present Illness:   Erin Jordan is a 74 y.o. female with history of CAD s/p prior CABG ~2006 in [place], graft disease by cath 06/2017, arthritis, CKD stage IV, kidney cancer, DM II, arthritis, hyperlipidemia, thyroid mass who presents for follow-up. Her last cath is outlined below with subsequent stent placement to the SVG-diagonal with residual disease including complex bifurcation lesion, which would likely lose the large lateral branch to the OM1 if PCI were performed. She recently was seen in the office 02/21/20 for pre-op cardiac clearance for total thyroidectomy due to thyroid mass. She is on methimazole. She was cleared by Coletta Memos NP but blood pressure was noted to be elevated so metoprolol was increased from 25mg  daily to 37.5mg  daily.  She is seen back for follow-up today with her granddaughter. Unfortunately they do not know what her blood pressure has been running lately as they do not have a BP cuff at home. Personal check of BP by me was 170/81 which is in line with recent values in the [id number] systolic. Of note, in 01/2020 her PCP had also increased amlodipine (thyroid studies unremarkable then). She is asymptomatic from cardiac standpoint and tolerated increase in beta blocker without adverse effect. Her thyroid surgery is scheduled for later this month. Aspirin is absent from her medicine list. Granddaughter states this was stopped by urology due to renal mass hemorrhage after surgery earlier this year and does not think it has not been revisited since that time.  Labwork independently reviewed: 01/2020 Hgb 12.4, K 3.7, Cr 2.04, LFTs wnl, LDL 91, TSH wnl, A1C 6.1  Past Medical History:  Diagnosis Date  . Arthritis   . Cancer of kidney  ([place])   . CKD (chronic kidney disease), stage IV ([place])   . Coronary atherosclerosis of native coronary artery 2006   Multivessel status post CABG in [place], graft disease documented March 2019 with DES to SVG to diagonal  . Essential hypertension   . 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 History of diabetes mellitus, type II   . Hyperlipidemia   . NSTEMI (non-ST elevated myocardial infarction) ([place]) 06/24/2017  . Renal hematoma   . Right renal mass     Past Surgical History:  Procedure Laterality Date  . ABDOMINAL HYSTERECTOMY    . CORONARY ARTERY BYPASS GRAFT  2006   [place], [place]  . CORONARY STENT INTERVENTION N/A 06/25/2017   Procedure: CORONARY STENT INTERVENTION;  Surgeon: Jettie Booze, MD;  Location: [facility];  Service: Cardiovascular;  Laterality: N/A;  . IR RADIOLOGIST EVAL & MGMT  07/08/2019  . LEFT HEART CATH AND CORS/GRAFTS ANGIOGRAPHY N/A 06/25/2017   Procedure: LEFT HEART CATH AND CORS/GRAFTS ANGIOGRAPHY;  Surgeon: Jettie Booze, MD;  Location: [facility];  Service: Cardiovascular;  Laterality: N/A;    Current Medications: Current Meds  Medication Sig  . amLODipine (NORVASC) 10 MG tablet Take 1 tablet (10 mg total) by mouth daily.  Marland Kitchen atorvastatin (LIPITOR) 80 MG tablet Take 1 tablet (80 mg total) by mouth daily.  . calcium carbonate (OSCAL) 1500 (600 Ca) MG TABS tablet Take 1,500 mg by mouth daily with breakfast.   . cholecalciferol (VITAMIN D) 25 MCG (1000 UT) tablet Take  1,000 Units by mouth daily.   Marland Kitchen donepezil (ARICEPT) 10 MG tablet Take 1 tablet (10 mg total) by mouth at bedtime.  . methimazole (TAPAZOLE) 5 MG tablet TAKE ONE TABLET BY MOUTH DAILY. (Patient taking differently: Take 5 mg by mouth daily.)  . nitroGLYCERIN (NITROSTAT) 0.4 MG SL tablet Place 1 tablet (0.4 mg total) under the tongue every 5 (five) minutes x 3 doses as needed for chest pain.  . [DISCONTINUED] metoprolol succinate (TOPROL XL) 25 MG 24 hr  tablet Take 1.5 tablets (37.5 mg total) by mouth daily.    Allergies:   Patient has no known allergies.   Social History   Socioeconomic History  . Marital status: Widowed    Spouse name: Not on file  . Number of children: 6  . Years of education: Not on file  . Highest education level: Not on file  Occupational History  . Occupation: retired  Tobacco Use  . Smoking status: Never Smoker  . Smokeless tobacco: Never Used  Vaping Use  . Vaping Use: Never used  Substance and Sexual Activity  . Alcohol use: No  . Drug use: No  . Sexual activity: Never  Other Topics Concern  . Not on file  Social History Narrative   Lives in [place] with her daughter and grandchildren.   Social Determinants of Health   Financial Resource Strain: Not on file  Food Insecurity: Not on file  Transportation Needs: Not on file  Physical Activity: Not on file  Stress: Not on file  Social Connections: Not on file     Family History:  The patient's family history includes Heart attack in her mother; Hypertension in her mother; Seizures in her daughter, son, and son.  ROS:   Please see the history of present illness. All other systems are reviewed and otherwise negative.    EKGs/Labs/Other Studies Reviewed:    Studies reviewed are outlined and summarized above. Reports included below if pertinent.  LHC 06/2017   Ost 1st Diag to 1st Diag lesion is 80% stenosed. THis is a long lesion nad is not grafted.  Prox LAD to Mid LAD lesion is 100% stenosed. LIMA to LAD is patent.  Lat 1st Mrg lesion is 95% stenosed.  Ost 1st Mrg to 1st Mrg lesion is 95% stenosed. SVG to OM is occluded.  Prox RCA lesion is 100% stenosed. SVG to PDA is occluded. This appears recent.  Ost 2nd Diag to 2nd Diag lesion is 100% stenosed. SVG to Diagonal with ostial 75% lesion.  A drug-eluting stent was successfully placed using a STENT SIERRA 4.00 X 15 MM.  Post intervention, there is a 0% residual  stenosis.  The left ventricular ejection fraction is 50-55% by visual estimate.  There is no aortic valve stenosis.   Continue aggressive medical therapy.  Complex circumflex bifurcation lesion.  Increase antianginals.  Would likely lose the large lateral branch of the OM1 if PCI were performed.   2D Echo 2013 - Left ventricle: The cavity size was normal.Mild tomoderate  LVH with disproportionate septal thickening. Systolic  function was normal. The estimated ejection fraction was  in the range of 60% to 65%. Wall motion was normal; there  were no regional wall motion abnormalities.  - Aortic valve: Mildly calcified annulus.  - Mitral valve: Calcified annulus.  - Right ventricle: The cavity size was normal. Wall  thickness was mildly to moderately increased.  - Atrial septum: No defect or patent foramen ovale was  identified.    LE  venous duplex 08/2019  IMPRESSION: No evidence of deep venous thrombosis in either lower extremity.    EKG:  EKG is ordered today, personally reviewed, demonstrating NSR 77bpm occasional PVCs, nonspecific TW changes  Recent Labs: 10/14/2019: Magnesium 2.2 01/21/2020: ALT 11; BUN 46; Creatinine, Ser 2.04; Hemoglobin 12.4; Platelets 392; Potassium 3.7; Sodium 142; TSH 1.160  Recent Lipid Panel    Component Value Date/Time   CHOL 177 01/21/2020 1637   TRIG 113 01/21/2020 1637   HDL 66 01/21/2020 1637   CHOLHDL 2.7 01/21/2020 1637   CHOLHDL 4.4 06/02/2011 0431   VLDL 18 06/02/2011 0431   LDLCALC 91 01/21/2020 1637    PHYSICAL EXAM:    VS:  BP (!) 170/81   Pulse 74   Ht 5\' 2"  (1.575 m)   Wt 109 lb (49.4 kg)   SpO2 99%   BMI 19.94 kg/m   BMI: Body mass index is 19.94 kg/m.  GEN: Well nourished, well developed elderly AAF, in no acute distress HEENT: normocephalic, atraumatic, arcus senilis Neck: no JVD, carotid bruits Cardiac: RRR, rare ectopy, no murmurs, rubs, or gallops, no edema  Respiratory:  clear to auscultation  bilaterally, normal work of breathing GI: soft, nontender, nondistended, + BS MS: no deformity or atrophy Skin: warm and dry, no rash Neuro:  Alert and Oriented x 3, Strength and sensation are intact, follows commands Psych: euthymic mood, full affect  Wt Readings from Last 3 Encounters:  03/23/20 109 lb (49.4 kg)  02/21/20 108 lb (49 kg)  12/22/19 112 lb 6.9 oz (51 kg)     ASSESSMENT & PLAN:   1. Essential HTN - BP remains poorly controlled. Will switch metoprolol to carvedilol 6.25mg  BID for improved control. I discussed a close f/u appt vs obtaining a home BP cuff with home monitoring with calling us with results. Her granddaughter is agreeable to the latter. They will check BP once daily on new regimen then relay results after 5 days. Otherwise will continue amlodipine. Avoid ACEi/ARB/ARNI given CKD. Granddaughter relays that primary care has questioned whether her BP issues may settle down post thyroid removal. We discussed plan for close post-surgical BP follow-up and they prefer to schedule this through primary care.  2. CAD s/p prior CABG - asymptomatic. No longer on ASA as above. I will reach out to urology to inquire if she is cleared to restart - may be reasonable to restart after thyroid surgery since she will need to hold for this procedure as well per granddaughter.   3. CKD stage IV - last labs reviewed above by PCP. See #5.  4. Hyperlipidemia - continue statin at present dose. LDL slightly above goal by last check, but in the midst of comorbidities would not make any changes today. She is already on high intensity dosing.  5. PVCs - rare ectopy heard on exam. EKG performed today showing occasional PVCs. She is asymptomatic. Will check BMET, Mg, TSH today to ensure no acute changes. Last EF 2019 was 50-55% and PVCs noted at that time as well. Given lack of cardiac symptoms will continue to monitor conservatively. Follow with beta blocker titration.  Disposition: F/u with Dr.  Domenic Polite in 6 months.  Medication Adjustments/Labs and Tests Ordered: Current medicines are reviewed at length with the patient today.  Concerns regarding medicines are outlined above. Medication changes, Labs and Tests ordered today are summarized above and listed in the Patient Instructions accessible in Encounters.    Signed, Charlie Pitter, PA-C  03/23/2020 3:33 PM  [facility] Location in [place], [place] [address] Ph: [phone]; Fax [phone]

## 2020-03-23 ENCOUNTER — Other Ambulatory Visit: Payer: Self-pay

## 2020-03-23 ENCOUNTER — Telehealth: Payer: Self-pay | Admitting: Physician Assistant

## 2020-03-23 ENCOUNTER — Other Ambulatory Visit (HOSPITAL_COMMUNITY)
Admission: RE | Admit: 2020-03-23 | Discharge: 2020-03-23 | Disposition: A | Discharge status: Home / Self Care | Payer: Medicare HMO | Source: Ambulatory Visit | Attending: Physician Assistant | Admitting: Physician Assistant

## 2020-03-23 ENCOUNTER — Ambulatory Visit (INDEPENDENT_AMBULATORY_CARE_PROVIDER_SITE_OTHER): Payer: Medicare HMO | Admitting: Physician Assistant

## 2020-03-23 ENCOUNTER — Encounter: Payer: Self-pay | Admitting: Physician Assistant

## 2020-03-23 VITALS — BP 170/81 | HR 74 | Ht 62.0 in | Wt 109.0 lb

## 2020-03-23 DIAGNOSIS — E785 Hyperlipidemia, unspecified: Secondary | ICD-10-CM

## 2020-03-23 DIAGNOSIS — N184 Chronic kidney disease, stage 4 (severe): Secondary | ICD-10-CM | POA: Diagnosis not present

## 2020-03-23 DIAGNOSIS — I2510 Atherosclerotic heart disease of native coronary artery without angina pectoris: Secondary | ICD-10-CM | POA: Diagnosis not present

## 2020-03-23 DIAGNOSIS — I10 Essential (primary) hypertension: Secondary | ICD-10-CM | POA: Insufficient documentation

## 2020-03-23 DIAGNOSIS — I493 Ventricular premature depolarization: Secondary | ICD-10-CM | POA: Insufficient documentation

## 2020-03-23 DIAGNOSIS — I499 Cardiac arrhythmia, unspecified: Secondary | ICD-10-CM | POA: Diagnosis not present

## 2020-03-23 LAB — BASIC METABOLIC PANEL
Anion gap: 10 (ref 5–15)
BUN: 43 mg/dL — ABNORMAL HIGH (ref 8–23)
CO2: 28 mmol/L (ref 22–32)
Calcium: 9.5 mg/dL (ref 8.9–10.3)
Chloride: 103 mmol/L (ref 98–111)
Creatinine, Ser: 2.32 mg/dL — ABNORMAL HIGH (ref 0.44–1.00)
GFR, Estimated: 21 mL/min — ABNORMAL LOW (ref >60)
Glucose, Bld: 114 mg/dL — ABNORMAL HIGH (ref 70–99)
Potassium: 4.6 mmol/L (ref 3.5–5.1)
Sodium: 141 mmol/L (ref 135–145)

## 2020-03-23 LAB — TSH: TSH: 1.393 u[IU]/mL (ref 0.350–4.500)

## 2020-03-23 LAB — MAGNESIUM: Magnesium: 2.5 mg/dL — ABNORMAL HIGH (ref 1.7–2.4)

## 2020-03-23 MED ORDER — CARVEDILOL 6.25 MG PO TABS: 6.2500 mg | ORAL_TABLET | Freq: Two times a day (BID) | ORAL | 3 refills | Status: DC

## 2020-03-23 NOTE — Patient Instructions (Addendum)
Medication Instructions:  Your physician has recommended you make the following change in your medication:   Stop Taking Metoprolol  Start Taking Coreg 6.25 mg Two Times Dailly   *If you need a refill on your cardiac medications before your next appointment, please call your pharmacy*   Lab Work: Your physician recommends that you return for lab work in: Today   If you have labs (blood work) drawn today and your tests are completely normal, you will receive your results only by: Marland Kitchen MyChart Message (if you have MyChart) OR . A paper copy in the mail If you have any lab test that is abnormal or we need to change your treatment, we will call you to review the results.   Testing/Procedures: NONE    Follow-Up: At [facility], you and your health needs are our priority.  As part of our continuing mission to provide you with exceptional heart care, we have created designated Provider Care Teams.  These Care Teams include your primary Cardiologist (physician) and Advanced Practice Providers (APPs -  Physician Assistants and Nurse Practitioners) who all work together to provide you with the care you need, when you need it.  We recommend signing up for the patient portal called "MyChart".  Sign up information is provided on this After Visit Summary.  MyChart is used to connect with patients for Virtual Visits (Telemedicine).  Patients are able to view lab/test results, encounter notes, upcoming appointments, etc.  Non-urgent messages can be sent to your provider as well.   To learn more about what you can do with MyChart, go to [email or web address].    Your next appointment:   6 month(s)  The format for your next appointment:   In Person  Provider:   Rozann Lesches, MD   Other Instructions Thank you for choosing [facility]!     I would recommend using a blood pressure cuff that goes on your arm. The wrist ones can be inaccurate. If possible, try to select one that  also reports your heart rate. To check your blood pressure, choose a time at least 3 hours after taking your blood pressure medicines. If you can sample it at different times of the day, that's great - it might give you more information about how your blood pressure fluctuates. Remain seated in a chair for 5 minutes quietly beforehand, then check it. Please record a list of those readings and call us/send in MyChart message with them for our review in 5 days.  Please schedule an appointment with your primary care provider for close follow-up after your thyroid surgery to trend your blood pressure.

## 2020-03-23 NOTE — Telephone Encounter (Addendum)
   I met patient in follow-up today for BP. She has history of CAD s/p prior CABG amongst other medical issues outlined in note. Historically she is supposed to be on aspirin due to her coronary disease. Her granddaughter states Dr. Alyson Ingles stopped it earlier this year - she had had a renal mass biopsy in 07/7652 complicated by renal hemorrhage at that time, necessitating that it be stopped. Granddaughter was not sure if this was a permanent recommendation or not. From cardiac standpoint would like to restart aspirin if she is able to (likely after her thyroid surgery coming up) but would like urology input so will route to Dr. Alyson Ingles. Thanks! Kendy Haston PA-C

## 2020-03-24 NOTE — Progress Notes (Signed)
DUE TO COVID-19 ONLY ONE VISITOR IS ALLOWED TO COME WITH YOU AND STAY IN THE WAITING ROOM ONLY DURING PRE OP AND PROCEDURE DAY OF SURGERY. THE 1 VISITOR  MAY VISIT WITH YOU AFTER SURGERY IN YOUR PRIVATE ROOM DURING VISITING HOURS ONLY!  YOU NEED TO HAVE A COVID 19 TEST ON_12/27/2021 ______ @_______ , THIS TEST MUST BE DONE BEFORE SURGERY,  COVID TESTING SITE [address] [place] [address], IT IS ON THE RIGHT GOING OUT [address] APPROXIMATELY  2 MINUTES PAST [employer] ON THE RIGHT. ONCE YOUR COVID TEST IS COMPLETED,  PLEASE BEGIN THE QUARANTINE INSTRUCTIONS AS OUTLINED IN YOUR HANDOUT.                BERNITA BECKSTROM  03/24/2020   Your procedure is scheduled on: 04/05/2020    Report to [facility] Main  Entrance   Report to admitting at     1100 AM     Call this number if you have problems the morning of surgery [phone]    REMEMBER: NO  SOLID FOOD CANDY OR GUM AFTER MIDNIGHT. CLEAR LIQUIDS UNTIL 1000am         . NOTHING BY MOUTH EXCEPT CLEAR LIQUIDS UNTIL    . PLEASE FINISH ENSURE DRINK PER SURGEON ORDER  WHICH NEEDS TO BE COMPLETED AT   1000am    .      CLEAR LIQUID DIET   Foods Allowed                                                                    Coffee and tea, regular and decaf                            Fruit ices (not with fruit pulp)                                      Iced Popsicles                                    Carbonated beverages, regular and diet                                    Cranberry, grape and apple juices Sports drinks like Gatorade Lightly seasoned clear broth or consume(fat free) Sugar, honey syrup ___________________________________________________________________      BRUSH YOUR TEETH MORNING OF SURGERY AND RINSE YOUR MOUTH OUT, NO CHEWING GUM CANDY OR MINTS.     Take these medicines the morning of surgery with A SIP OF WATER:  Amlodipine, coreg, Tapazole  DO NOT TAKE ANY DIABETIC MEDICATIONS DAY  OF YOUR SURGERY                               You may not have any metal on your body including hair pins and              piercings  Do not wear jewelry, make-up, lotions, powders or perfumes, deodorant             Do not wear nail polish on your fingernails.  Do not shave  48 hours prior to surgery.              Men may shave face and neck.   Do not bring valuables to the hospital. [place].  Contacts, dentures or bridgework may not be worn into surgery.  Leave suitcase in the car. After surgery it may be brought to your room.     Patients discharged the day of surgery will not be allowed to drive home. IF YOU ARE HAVING SURGERY AND GOING HOME THE SAME DAY, YOU MUST HAVE AN ADULT TO DRIVE YOU HOME AND BE WITH YOU FOR 24 HOURS. YOU MAY GO HOME BY TAXI OR UBER OR ORTHERWISE, BUT AN ADULT MUST ACCOMPANY YOU HOME AND STAY WITH YOU FOR 24 HOURS.  Name and phone number of your driver:  Special Instructions: N/A              Please read over the following fact sheets you were given: _____________________________________________________________________  [facility] - Preparing for Surgery Before surgery, you can play an important role.  Because skin is not sterile, your skin needs to be as free of germs as possible.  You can reduce the number of germs on your skin by washing with CHG (chlorahexidine gluconate) soap before surgery.  CHG is an antiseptic cleaner which kills germs and bonds with the skin to continue killing germs even after washing. Please DO NOT use if you have an allergy to CHG or antibacterial soaps.  If your skin becomes reddened/irritated stop using the CHG and inform your nurse when you arrive at Short Stay. Do not shave (including legs and underarms) for at least 48 hours prior to the first CHG shower.  You may shave your face/neck. Please follow these instructions carefully:  1.  Shower with CHG Soap the night before surgery  and the  morning of Surgery.  2.  If you choose to wash your hair, wash your hair first as usual with your  normal  shampoo.  3.  After you shampoo, rinse your hair and body thoroughly to remove the  shampoo.                           4.  Use CHG as you would any other liquid soap.  You can apply chg directly  to the skin and wash                       Gently with a scrungie or clean washcloth.  5.  Apply the CHG Soap to your body ONLY FROM THE NECK DOWN.   Do not use on face/ open                           Wound or open sores. Avoid contact with eyes, ears mouth and genitals (private parts).                       Wash face,  Genitals (private parts) with your normal soap.             6.  Wash  thoroughly, paying special attention to the area where your surgery  will be performed.  7.  Thoroughly rinse your body with warm water from the neck down.  8.  DO NOT shower/wash with your normal soap after using and rinsing off  the CHG Soap.                9.  Pat yourself dry with a clean towel.            10.  Wear clean pajamas.            11.  Place clean sheets on your bed the night of your first shower and do not  sleep with pets. Day of Surgery : Do not apply any lotions/deodorants the morning of surgery.  Please wear clean clothes to the hospital/surgery center.  FAILURE TO FOLLOW THESE INSTRUCTIONS MAY RESULT IN THE CANCELLATION OF YOUR SURGERY PATIENT SIGNATURE_________________________________  NURSE SIGNATURE__________________________________  ________________________________________________________________________

## 2020-03-24 NOTE — Addendum Note (Signed)
Addended by: Levonne Hubert on: 03/24/2020 10:19 AM   Modules accepted: Orders

## 2020-03-27 ENCOUNTER — Telehealth: Payer: Self-pay | Admitting: *Deleted

## 2020-03-27 DIAGNOSIS — N184 Chronic kidney disease, stage 4 (severe): Secondary | ICD-10-CM

## 2020-03-27 NOTE — Telephone Encounter (Signed)
-----   Message from Charlie Pitter, [place] sent at 03/23/2020  5:16 PM EST ----- Please let pt know labs are stable except kidney function shows worsenin from prior values from 2-2.32. She was as high as the mid 2's-3's earlier this year then settled out to 1.8-2.0 recently. We do not have her on anything cardiac wise that would cause an increase in creatinine. Make sure she's eating and drinking well. Please find out if she has a kidney doctor, and if not, place referral for CKD stage IV - in the meantime will also route to primary care for input as patient plans to follow up there for her blood pressure after her thyroid surgery. (Otherwise will not follow with cardiology until 6 months).

## 2020-03-27 NOTE — Telephone Encounter (Signed)
Granddaughter Annelies Coyt notified and order placed.

## 2020-03-27 NOTE — Telephone Encounter (Signed)
-----   Message from Erin Jordan, [place] sent at 03/25/2020  8:05 AM EST ----- After reaching patient can you please contact PCP's office to see if they would be able to follow this up? Likely needs repeat labs to continue to trend in the upcoming weeks. Thanks.

## 2020-03-28 ENCOUNTER — Inpatient Hospital Stay (HOSPITAL_COMMUNITY)
Admission: EM | Admit: 2020-03-28 | Discharge: 2020-03-30 | DRG: 194 | Disposition: A | Discharge status: Home Health | Payer: Medicare HMO | Attending: Family Medicine | Admitting: Family Medicine

## 2020-03-28 ENCOUNTER — Encounter (HOSPITAL_COMMUNITY)
Admission: RE | Admit: 2020-03-28 | Discharge: 2020-03-28 | Disposition: A | Discharge status: Home / Self Care | Payer: Medicare HMO | Source: Ambulatory Visit | Attending: Anesthesiology | Admitting: Anesthesiology

## 2020-03-28 ENCOUNTER — Other Ambulatory Visit: Payer: Self-pay

## 2020-03-28 ENCOUNTER — Emergency Department (HOSPITAL_COMMUNITY): Payer: Medicare HMO

## 2020-03-28 DIAGNOSIS — I2510 Atherosclerotic heart disease of native coronary artery without angina pectoris: Secondary | ICD-10-CM | POA: Diagnosis present

## 2020-03-28 DIAGNOSIS — Z955 Presence of coronary angioplasty implant and graft: Secondary | ICD-10-CM

## 2020-03-28 DIAGNOSIS — E039 Hypothyroidism, unspecified: Secondary | ICD-10-CM | POA: Diagnosis present

## 2020-03-28 DIAGNOSIS — N184 Chronic kidney disease, stage 4 (severe): Secondary | ICD-10-CM | POA: Diagnosis present

## 2020-03-28 DIAGNOSIS — R509 Fever, unspecified: Secondary | ICD-10-CM | POA: Diagnosis not present

## 2020-03-28 DIAGNOSIS — J180 Bronchopneumonia, unspecified organism: Secondary | ICD-10-CM

## 2020-03-28 DIAGNOSIS — E0500 Thyrotoxicosis with diffuse goiter without thyrotoxic crisis or storm: Secondary | ICD-10-CM | POA: Diagnosis present

## 2020-03-28 DIAGNOSIS — Z9861 Coronary angioplasty status: Secondary | ICD-10-CM

## 2020-03-28 DIAGNOSIS — Z951 Presence of aortocoronary bypass graft: Secondary | ICD-10-CM | POA: Diagnosis not present

## 2020-03-28 DIAGNOSIS — J189 Pneumonia, unspecified organism: Secondary | ICD-10-CM | POA: Diagnosis present

## 2020-03-28 DIAGNOSIS — G309 Alzheimer's disease, unspecified: Secondary | ICD-10-CM | POA: Diagnosis not present

## 2020-03-28 DIAGNOSIS — E785 Hyperlipidemia, unspecified: Secondary | ICD-10-CM | POA: Diagnosis present

## 2020-03-28 DIAGNOSIS — I10 Essential (primary) hypertension: Secondary | ICD-10-CM | POA: Diagnosis present

## 2020-03-28 DIAGNOSIS — E042 Nontoxic multinodular goiter: Secondary | ICD-10-CM

## 2020-03-28 DIAGNOSIS — R000 Tachycardia, unspecified: Secondary | ICD-10-CM | POA: Diagnosis present

## 2020-03-28 DIAGNOSIS — F0390 Unspecified dementia without behavioral disturbance: Secondary | ICD-10-CM | POA: Diagnosis present

## 2020-03-28 DIAGNOSIS — Z79899 Other long term (current) drug therapy: Secondary | ICD-10-CM | POA: Diagnosis not present

## 2020-03-28 DIAGNOSIS — G2581 Restless legs syndrome: Secondary | ICD-10-CM | POA: Diagnosis present

## 2020-03-28 DIAGNOSIS — F0281 Dementia in other diseases classified elsewhere with behavioral disturbance: Secondary | ICD-10-CM | POA: Diagnosis not present

## 2020-03-28 DIAGNOSIS — Z20822 Contact with and (suspected) exposure to covid-19: Secondary | ICD-10-CM | POA: Diagnosis present

## 2020-03-28 DIAGNOSIS — Z8249 Family history of ischemic heart disease and other diseases of the circulatory system: Secondary | ICD-10-CM

## 2020-03-28 DIAGNOSIS — F0391 Unspecified dementia with behavioral disturbance: Secondary | ICD-10-CM | POA: Diagnosis present

## 2020-03-28 DIAGNOSIS — E0590 Thyrotoxicosis, unspecified without thyrotoxic crisis or storm: Secondary | ICD-10-CM | POA: Diagnosis present

## 2020-03-28 DIAGNOSIS — I129 Hypertensive chronic kidney disease with stage 1 through stage 4 chronic kidney disease, or unspecified chronic kidney disease: Secondary | ICD-10-CM | POA: Diagnosis present

## 2020-03-28 DIAGNOSIS — E1122 Type 2 diabetes mellitus with diabetic chronic kidney disease: Secondary | ICD-10-CM | POA: Diagnosis present

## 2020-03-28 DIAGNOSIS — C641 Malignant neoplasm of right kidney, except renal pelvis: Secondary | ICD-10-CM | POA: Diagnosis present

## 2020-03-28 DIAGNOSIS — I252 Old myocardial infarction: Secondary | ICD-10-CM

## 2020-03-28 DIAGNOSIS — N183 Chronic kidney disease, stage 3 unspecified: Secondary | ICD-10-CM | POA: Diagnosis present

## 2020-03-28 DIAGNOSIS — Z90710 Acquired absence of both cervix and uterus: Secondary | ICD-10-CM | POA: Diagnosis not present

## 2020-03-28 DIAGNOSIS — Z66 Do not resuscitate: Secondary | ICD-10-CM | POA: Diagnosis present

## 2020-03-28 LAB — URINALYSIS, ROUTINE W REFLEX MICROSCOPIC
Bacteria, UA: NONE SEEN
Bilirubin Urine: NEGATIVE
Glucose, UA: 50 mg/dL — AB
Hgb urine dipstick: NEGATIVE
Ketones, ur: NEGATIVE mg/dL
Leukocytes,Ua: NEGATIVE
Nitrite: NEGATIVE
Protein, ur: >=300 mg/dL — AB
Specific Gravity, Urine: 1.014 (ref 1.005–1.030)
pH: 5 (ref 5.0–8.0)

## 2020-03-28 LAB — CBC WITH DIFFERENTIAL/PLATELET
Abs Immature Granulocytes: 0.03 10*3/uL (ref 0.00–0.07)
Basophils Absolute: 0 10*3/uL (ref 0.0–0.1)
Basophils Relative: 0 %
Eosinophils Absolute: 0 10*3/uL (ref 0.0–0.5)
Eosinophils Relative: 0 %
HCT: 46.9 % — ABNORMAL HIGH (ref 36.0–46.0)
Hemoglobin: 15.5 g/dL — ABNORMAL HIGH (ref 12.0–15.0)
Immature Granulocytes: 0 %
Lymphocytes Relative: 3 %
Lymphs Abs: 0.3 10*3/uL — ABNORMAL LOW (ref 0.7–4.0)
MCH: 30.9 pg (ref 26.0–34.0)
MCHC: 33 g/dL (ref 30.0–36.0)
MCV: 93.4 fL (ref 80.0–100.0)
Monocytes Absolute: 0.7 10*3/uL (ref 0.1–1.0)
Monocytes Relative: 6 %
Neutro Abs: 10.9 10*3/uL — ABNORMAL HIGH (ref 1.7–7.7)
Neutrophils Relative %: 91 %
Platelets: 281 10*3/uL (ref 150–400)
RBC: 5.02 MIL/uL (ref 3.87–5.11)
RDW: 12.6 % (ref 11.5–15.5)
WBC: 11.9 10*3/uL — ABNORMAL HIGH (ref 4.0–10.5)
nRBC: 0 % (ref 0.0–0.2)

## 2020-03-28 LAB — COMPREHENSIVE METABOLIC PANEL
ALT: 16 U/L (ref 0–44)
AST: 22 U/L (ref 15–41)
Albumin: 4.9 g/dL (ref 3.5–5.0)
Alkaline Phosphatase: 93 U/L (ref 38–126)
Anion gap: 15 (ref 5–15)
BUN: 34 mg/dL — ABNORMAL HIGH (ref 8–23)
CO2: 23 mmol/L (ref 22–32)
Calcium: 10 mg/dL (ref 8.9–10.3)
Chloride: 100 mmol/L (ref 98–111)
Creatinine, Ser: 1.9 mg/dL — ABNORMAL HIGH (ref 0.44–1.00)
GFR, Estimated: 27 mL/min — ABNORMAL LOW (ref >60)
Glucose, Bld: 232 mg/dL — ABNORMAL HIGH (ref 70–99)
Potassium: 4 mmol/L (ref 3.5–5.1)
Sodium: 138 mmol/L (ref 135–145)
Total Bilirubin: 0.7 mg/dL (ref 0.3–1.2)
Total Protein: 8.3 g/dL — ABNORMAL HIGH (ref 6.5–8.1)

## 2020-03-28 LAB — RESP PANEL BY RT-PCR (FLU A&B, COVID) ARPGX2
Influenza A by PCR: NEGATIVE
Influenza B by PCR: NEGATIVE
SARS Coronavirus 2 by RT PCR: NEGATIVE

## 2020-03-28 LAB — LACTIC ACID, PLASMA
Lactic Acid, Venous: 2.5 mmol/L (ref 0.5–1.9)
Lactic Acid, Venous: 1.9 mmol/L (ref 0.5–1.9)

## 2020-03-28 LAB — PROCALCITONIN: Procalcitonin: <0.1 ng/mL

## 2020-03-28 IMAGING — DX DG CHEST 1V PORT
1 series · 1 of 1 positions shown · non-contrast
Comparison: [DATE]

CLINICAL DATA: Fever

EXAM:
PORTABLE CHEST 1 VIEW

[chest ap]
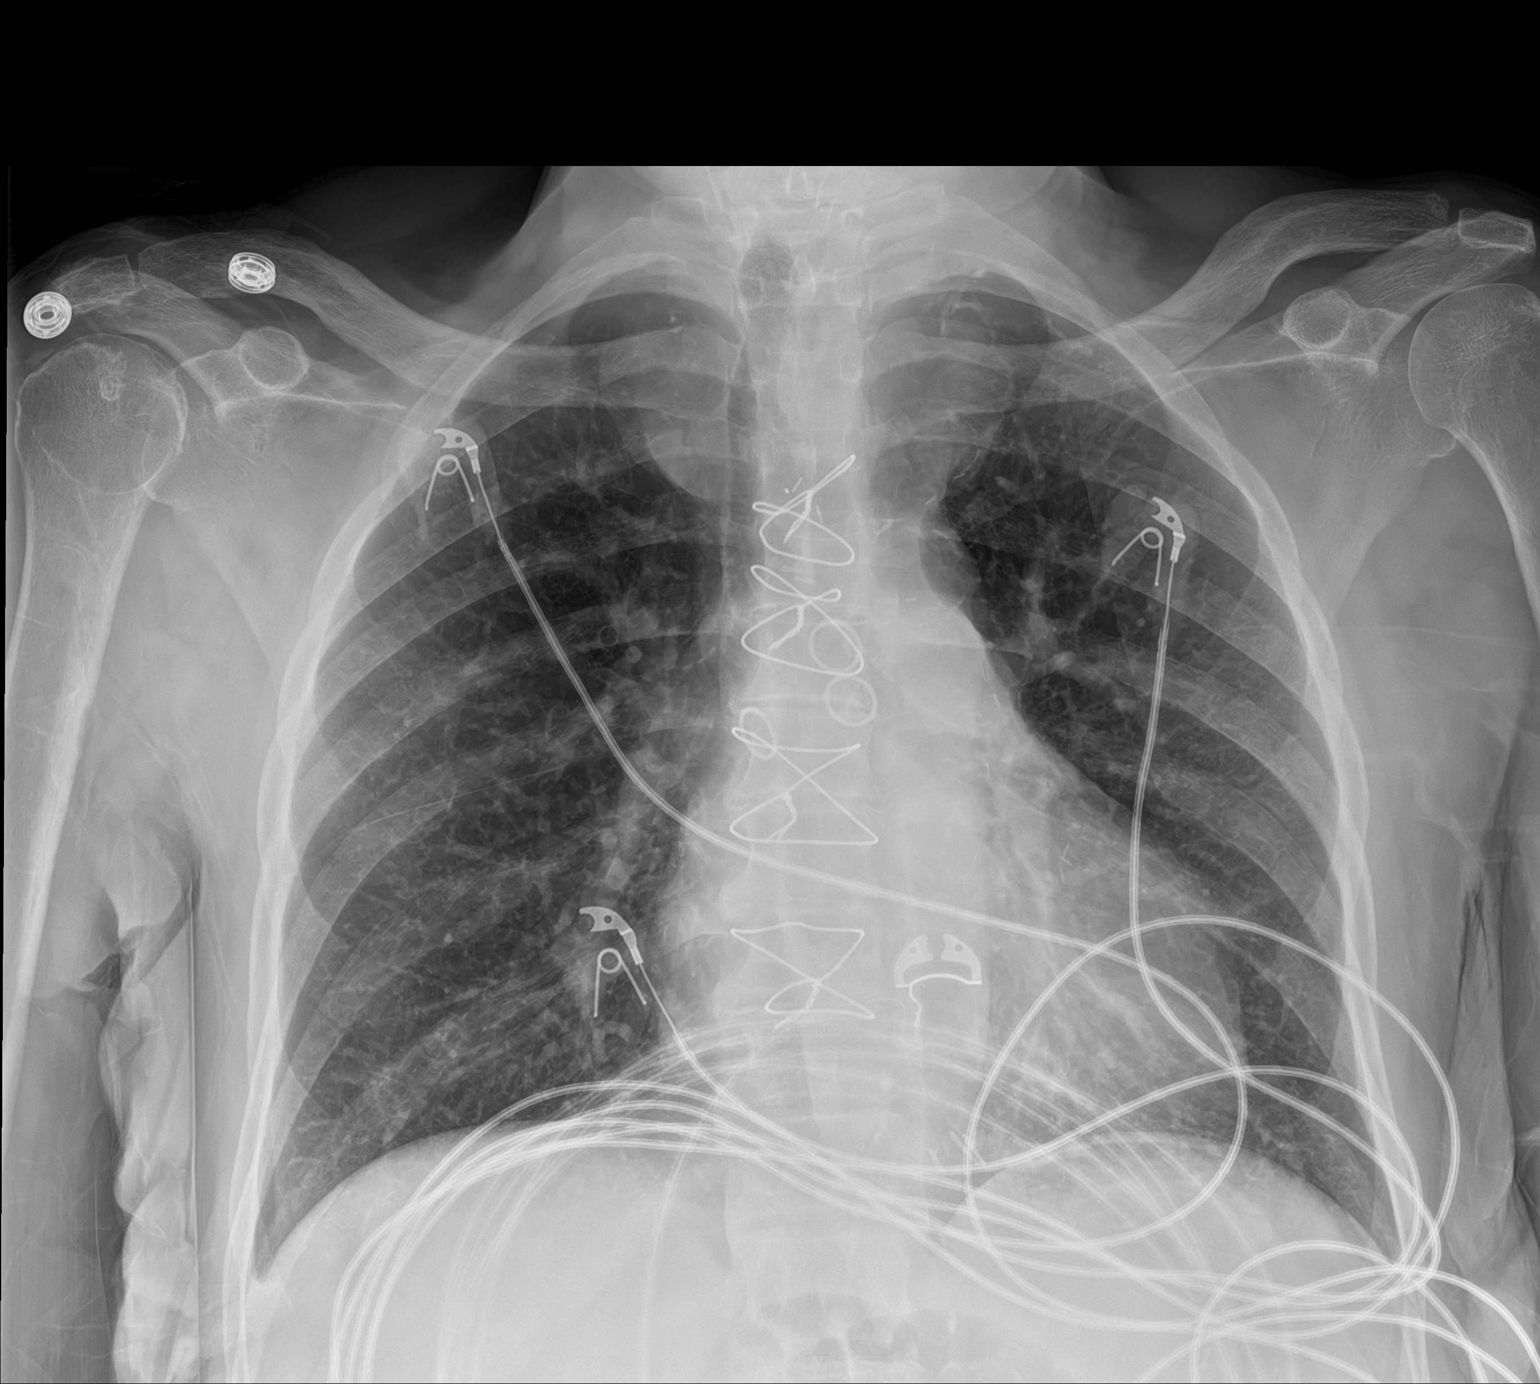

[1 of 1 positions shown; findings below may reference images not displayed]

FINDINGS: Extensive artifact from EKG leads. Normal heart size and aortic
contours. CABG. Upper mediastinal widening which is from goiter by a
[GY] PET-CT. Accentuated markings behind the heart. No edema,
effusion, or pneumothorax.
IMPRESSION: Accentuated markings behind the heart, equivocal for
bronchopneumonia.

## 2020-03-28 MED ORDER — DONEPEZIL HCL 5 MG PO TABS
10.0000 mg | ORAL_TABLET | Freq: Every day | ORAL | Status: DC
  Administered 2020-03-28 – 2020-03-29 (×2): 10 mg via ORAL
  Filled 2020-03-28 (×2): qty 2

## 2020-03-28 MED ORDER — HYDRALAZINE HCL 20 MG/ML IJ SOLN: 10.0000 mg | Freq: Four times a day (QID) | INTRAMUSCULAR | Status: DC | PRN

## 2020-03-28 MED ORDER — IPRATROPIUM-ALBUTEROL 0.5-2.5 (3) MG/3ML IN SOLN: 3.0000 mL | Freq: Four times a day (QID) | RESPIRATORY_TRACT | Status: DC | PRN

## 2020-03-28 MED ORDER — ONDANSETRON HCL 4 MG PO TABS: 4.0000 mg | ORAL_TABLET | Freq: Four times a day (QID) | ORAL | Status: DC | PRN

## 2020-03-28 MED ORDER — SODIUM CHLORIDE 0.9 % IV SOLN
1.0000 g | Freq: Once | INTRAVENOUS | Status: AC
  Administered 2020-03-28: 1 g via INTRAVENOUS
  Filled 2020-03-28: qty 10

## 2020-03-28 MED ORDER — SODIUM CHLORIDE 0.9 % IV SOLN: INTRAVENOUS | Status: AC

## 2020-03-28 MED ORDER — ACETAMINOPHEN 650 MG RE SUPP: 650.0000 mg | Freq: Four times a day (QID) | RECTAL | Status: DC | PRN

## 2020-03-28 MED ORDER — SODIUM CHLORIDE 0.9 % IV BOLUS
1000.0000 mL | Freq: Once | INTRAVENOUS | Status: AC
  Administered 2020-03-28: 1000 mL via INTRAVENOUS

## 2020-03-28 MED ORDER — ACETAMINOPHEN 325 MG PO TABS: 650.0000 mg | ORAL_TABLET | Freq: Four times a day (QID) | ORAL | Status: DC | PRN

## 2020-03-28 MED ORDER — VITAMIN D 25 MCG (1000 UNIT) PO TABS
1000.0000 [IU] | ORAL_TABLET | Freq: Every day | ORAL | Status: DC
  Administered 2020-03-28 – 2020-03-30 (×3): 1000 [IU] via ORAL
  Filled 2020-03-28 (×5): qty 1

## 2020-03-28 MED ORDER — SODIUM CHLORIDE 0.9 % IV SOLN
3.0000 g | Freq: Two times a day (BID) | INTRAVENOUS | Status: DC
  Administered 2020-03-28 – 2020-03-29 (×3): 3 g via INTRAVENOUS
  Filled 2020-03-28 (×5): qty 8

## 2020-03-28 MED ORDER — POLYETHYLENE GLYCOL 3350 17 G PO PACK: 17.0000 g | PACK | Freq: Every day | ORAL | Status: DC | PRN

## 2020-03-28 MED ORDER — AMLODIPINE BESYLATE 5 MG PO TABS
10.0000 mg | ORAL_TABLET | Freq: Every day | ORAL | Status: DC
  Administered 2020-03-28 – 2020-03-30 (×3): 10 mg via ORAL
  Filled 2020-03-28 (×3): qty 2

## 2020-03-28 MED ORDER — HEPARIN SODIUM (PORCINE) 5000 UNIT/ML IJ SOLN
5000.0000 [IU] | Freq: Three times a day (TID) | INTRAMUSCULAR | Status: DC
  Administered 2020-03-28 – 2020-03-30 (×6): 5000 [IU] via SUBCUTANEOUS
  Filled 2020-03-28 (×6): qty 1

## 2020-03-28 MED ORDER — ATORVASTATIN CALCIUM 40 MG PO TABS
80.0000 mg | ORAL_TABLET | Freq: Every day | ORAL | Status: DC
  Administered 2020-03-28 – 2020-03-30 (×3): 80 mg via ORAL
  Filled 2020-03-28 (×3): qty 2

## 2020-03-28 MED ORDER — NITROGLYCERIN 0.4 MG SL SUBL: 0.4000 mg | SUBLINGUAL_TABLET | SUBLINGUAL | Status: DC | PRN

## 2020-03-28 MED ORDER — METHIMAZOLE 5 MG PO TABS
5.0000 mg | ORAL_TABLET | Freq: Every day | ORAL | Status: DC
  Administered 2020-03-28 – 2020-03-30 (×3): 5 mg via ORAL
  Filled 2020-03-28 (×3): qty 1

## 2020-03-28 MED ORDER — ONDANSETRON HCL 4 MG/2ML IJ SOLN: 4.0000 mg | Freq: Four times a day (QID) | INTRAMUSCULAR | Status: DC | PRN

## 2020-03-28 MED ORDER — CARVEDILOL 3.125 MG PO TABS
6.2500 mg | ORAL_TABLET | Freq: Two times a day (BID) | ORAL | Status: DC
  Administered 2020-03-28 – 2020-03-29 (×3): 6.25 mg via ORAL
  Filled 2020-03-28 (×4): qty 2

## 2020-03-28 MED ORDER — SODIUM CHLORIDE 0.9 % IV SOLN
500.0000 mg | INTRAVENOUS | Status: DC
  Administered 2020-03-29: 500 mg via INTRAVENOUS
  Filled 2020-03-28: qty 500

## 2020-03-28 MED ORDER — SODIUM CHLORIDE 0.9 % IV SOLN
500.0000 mg | Freq: Once | INTRAVENOUS | Status: AC
  Administered 2020-03-28: 500 mg via INTRAVENOUS
  Filled 2020-03-28: qty 500

## 2020-03-28 NOTE — ED Triage Notes (Signed)
From home. Family called out for fever. Per EMS house was very hot inside. Pt has hx of dementia.

## 2020-03-28 NOTE — ED Provider Notes (Signed)
[name] EMERGENCY DEPARTMENT Provider Note   CSN: [id number] Arrival date & time: 03/28/20  0957     History Chief Complaint  Patient presents with  . Fever    Erin Jordan is a 74 y.o. female with a prior past medical history of CKD stage IV, CAD, hypertension, hyperlipidemia, NSTEMI that presents emergency department today for fever.  Patient has severe dementia, level 5 caveat.  Was able to speak to family on the phone, they brought her in for temperature of 102 at home.  According to EMS it was very warm in the house, when patient was brought here temperature was 99.6 orally, 99.3 rectally.  Did not receive any antipyretics en route.  Was able to speak to daughter and son on the phone, they state for the past 2 days she has been more weak at home, patient admits to cough.  No other symptoms.  They are unsure about how long she has had a fever, there are multiple people that come into her home to take care of her, not 1 primary caregiver.  They state that today she can even get out of bed due to weakness, no sick contacts.  She did have a primary care appointment today, had to cancel it due to weakness.  Patient is at baseline, no increased confusion, however there are no family members here to confirm.  HPI     Past Medical History:  Diagnosis Date  . Arthritis   . Cancer of kidney ([place])   . CKD (chronic kidney disease), stage IV ([place])   . Coronary atherosclerosis of native coronary artery 2006   Multivessel status post CABG in [place], graft disease documented March 2019 with DES to SVG to diagonal  . Essential hypertension   . 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 History of diabetes mellitus, type II   . Hyperlipidemia   . NSTEMI (non-ST elevated myocardial infarction) ([place]) 06/24/2017  . Renal hematoma   . Right renal mass     Patient Active Problem List   Diagnosis Date Noted  . PNA (pneumonia) 03/28/2020  . Renal cell carcinoma ([place]) 08/02/2019  .  Acute blood loss anemia 08/02/2019  . RLS (restless legs syndrome) 01/25/2019  . Right renal mass 01/25/2019  . Splenic mass 01/25/2019  . Blood in stool 01/25/2019  . Dementia without behavioral disturbance ([place]) 01/25/2019  . Hyperthyroidism 09/14/2018  . Osteopenia 05/20/2018  . Multinodular goiter 10/14/2017  . Weight loss, unintentional 07/14/2017  . CAD -S/P PCI 06/26/17 06/27/2017  . 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 Hx of CABG- 2006   . History of diabetes mellitus   . Essential hypertension   . Stage 3 chronic kidney disease ([place])   . Hypokalemia 06/02/2011  . Dyslipidemia, goal LDL below 70 06/02/2011    Past Surgical History:  Procedure Laterality Date  . ABDOMINAL HYSTERECTOMY    . CORONARY ARTERY BYPASS GRAFT  2006   [place], [place]  . CORONARY STENT INTERVENTION N/A 06/25/2017   Procedure: CORONARY STENT INTERVENTION;  Surgeon: Jettie Booze, MD;  Location: [facility];  Service: Cardiovascular;  Laterality: N/A;  . IR RADIOLOGIST EVAL & MGMT  07/08/2019  . LEFT HEART CATH AND CORS/GRAFTS ANGIOGRAPHY N/A 06/25/2017   Procedure: LEFT HEART CATH AND CORS/GRAFTS ANGIOGRAPHY;  Surgeon: Jettie Booze, MD;  Location: [facility];  Service: Cardiovascular;  Laterality: N/A;     OB History    Gravida  Para      Term      Preterm      AB      Living  6     SAB      IAB      Ectopic      Multiple      Live Births              Family History  Problem Relation Age of Onset  . Heart attack Mother   . Hypertension Mother   . Seizures Son   . Seizures Son   . Seizures Daughter     Social History   Tobacco Use  . Smoking status: Never Smoker  . Smokeless tobacco: Never Used  Vaping Use  . Vaping Use: Never used  Substance Use Topics  . Alcohol use: No  . Drug use: No    Home Medications Prior to Admission medications   Medication Sig Start Date End Date Taking? Authorizing Provider  amLODipine  (NORVASC) 10 MG tablet Take 1 tablet (10 mg total) by mouth daily. 01/13/20  Yes [name], FNP  atorvastatin (LIPITOR) 80 MG tablet Take 1 tablet (80 mg total) by mouth daily. 08/19/19  Yes Hendricks Limes F, FNP  donepezil (ARICEPT) 10 MG tablet Take 1 tablet (10 mg total) by mouth at bedtime. 01/13/20  Yes Hendricks Limes F, FNP  methimazole (TAPAZOLE) 5 MG tablet TAKE ONE TABLET BY MOUTH DAILY. Patient taking differently: Take 5 mg by mouth daily. 03/01/20  Yes Claretta Fraise, MD  nitroGLYCERIN (NITROSTAT) 0.4 MG SL tablet Place 1 tablet (0.4 mg total) under the tongue every 5 (five) minutes x 3 doses as needed for chest pain. 07/07/19  Yes Satira Sark, MD  carvedilol (COREG) 6.25 MG tablet Take 1 tablet (6.25 mg total) by mouth 2 (two) times daily. 03/23/20   Dunn, Nedra Hai, PA-C  cholecalciferol (VITAMIN D) 25 MCG (1000 UT) tablet Take 1,000 Units by mouth daily.     [provider]    Allergies    Patient has no known allergies.  Review of Systems   Review of Systems  Unable to perform ROS: Dementia    Physical Exam Updated Vital Signs BP (!) 169/80   Pulse 100   Temp 98.9 F (37.2 C) (Oral)   Resp 17   Ht 5\' 2"  (1.575 m)   Wt 50.3 kg   SpO2 100%   BMI 20.30 kg/m   Physical Exam Constitutional:      General: She is not in acute distress.    Appearance: Normal appearance. She is not ill-appearing, toxic-appearing or diaphoretic.  HENT:     Head: Normocephalic and atraumatic.     Mouth/Throat:     Mouth: Mucous membranes are moist.     Pharynx: Oropharynx is clear.  Eyes:     General: No scleral icterus.    Extraocular Movements: Extraocular movements intact.     Pupils: Pupils are equal, round, and reactive to light.  Cardiovascular:     Rate and Rhythm: Normal rate and regular rhythm.     Pulses: Normal pulses.     Heart sounds: Normal heart sounds.  Pulmonary:     Effort: Pulmonary effort is normal. No respiratory distress.     Breath sounds:  Normal breath sounds. No stridor. No wheezing, rhonchi or rales.  Chest:     Chest wall: No tenderness.  Abdominal:     General: Abdomen is flat. There is no distension.  Palpations: Abdomen is soft.     Tenderness: There is no abdominal tenderness. There is no guarding or rebound.  Musculoskeletal:        General: No swelling or tenderness. Normal range of motion.     Cervical back: Normal range of motion and neck supple. No rigidity.     Right lower leg: No edema.     Left lower leg: No edema.  Skin:    General: Skin is warm and dry.     Capillary Refill: Capillary refill takes less than 2 seconds.     Coloration: Skin is not pale.     Comments: No lesions noted.  Neurological:     General: No focal deficit present.     Mental Status: She is alert and oriented to person, place, and time.     Comments: Patient is able to follow commands well, alert and oriented x2.  Patient is aware where she is and what her name is, however unknown sure why she is here or what the date is.  Normal strength to upper and lower extremities.  Psychiatric:        Mood and Affect: Mood normal.        Behavior: Behavior normal.     ED Results / Procedures / Treatments   Labs (all labs ordered are listed, but only abnormal results are displayed) Labs Reviewed  LACTIC ACID, PLASMA - Abnormal; Notable for the following components:      Result Value   Lactic Acid, Venous 2.5 (*)    All other components within normal limits  COMPREHENSIVE METABOLIC PANEL - Abnormal; Notable for the following components:   Glucose, Bld 232 (*)    BUN 34 (*)    Creatinine, Ser 1.90 (*)    Total Protein 8.3 (*)    GFR, Estimated 27 (*)    All other components within normal limits  CBC WITH DIFFERENTIAL/PLATELET - Abnormal; Notable for the following components:   WBC 11.9 (*)    Hemoglobin 15.5 (*)    HCT 46.9 (*)    Neutro Abs 10.9 (*)    Lymphs Abs 0.3 (*)    All other components within normal limits   URINALYSIS, ROUTINE W REFLEX MICROSCOPIC - Abnormal; Notable for the following components:   Glucose, UA 50 (*)    Protein, ur >=300 (*)    All other components within normal limits  RESP PANEL BY RT-PCR (FLU A&B, COVID) ARPGX2  CULTURE, BLOOD (ROUTINE X 2)  CULTURE, BLOOD (ROUTINE X 2)  LACTIC ACID, PLASMA  PROCALCITONIN    EKG None  Radiology DG Chest Port 1 View  Result Date: 03/28/2020 CLINICAL DATA:  Fever EXAM: PORTABLE CHEST 1 VIEW COMPARISON:  10/14/2019 FINDINGS: Extensive artifact from EKG leads. Normal heart size and aortic contours. CABG. 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 Accentuated markings behind the heart. No edema, effusion, or pneumothorax. IMPRESSION: Accentuated markings behind the heart, equivocal for bronchopneumonia. Electronically Signed   By: Monte Fantasia M.D.   On: 03/28/2020 11:15    Procedures Procedures (including critical care time)  Medications Ordered in ED Medications  Ampicillin-Sulbactam (UNASYN) 3 g in sodium chloride 0.9 % 100 mL IVPB (0 g Intravenous Stopped 03/28/20 1630)  sodium chloride 0.9 % bolus 1,000 mL (0 mLs Intravenous Stopped 03/28/20 1156)  cefTRIAXone (ROCEPHIN) 1 g in sodium chloride 0.9 % 100 mL IVPB (0 g Intravenous Stopped 03/28/20 1404)  azithromycin (ZITHROMAX) 500 mg in sodium chloride 0.9 %  250 mL IVPB (0 mg Intravenous Stopped 03/28/20 1543)  sodium chloride 0.9 % bolus 1,000 mL (0 mLs Intravenous Stopped 03/28/20 1630)    ED Course  I have reviewed the triage vital signs and the nursing notes.  Pertinent labs & imaging results that were available during my care of the patient were reviewed by me and considered in my medical decision making (see chart for details).  Clinical Course as of 03/28/20 1704  Tue Mar 28, 2020  1639 Monocytes Relative: 6 [SP]    Clinical Course User Index [SP] Alfredia Client, PA-C   MDM Rules/Calculators/A&P                         Erin Jordan is  a 74 y.o. female with a prior past medical history of CKD stage IV, CAD, hypertension, hyperlipidemia, NSTEMI that presents emergency department today for fever. Patient with severe dementia, when I was able to see patient lactic acid resulted 2.5 with small leukocytosis of 11.9.  Family states that she is neurologically at baseline.  With reported temperature of 102 with tachycardia, sepsis order set ordered including blood cultures.  We will hang a liter of fluids at this time, still awaiting chest x-ray, Covid, urinalysis and other labs.  No hypoxia.  Chest x-ray equivocal for bronchopneumonia.  This is fit with patient's cough and weakness, I think patient would benefit from admission at this time.  Upon reevaluation, patient still tachycardic to 107.  Did start patient on IV antibiotics, second liter fluid time which would equate to 30 cc/kg.  Second lactic acid 1.9, did speak to family who think that admission would also be beneficial at this time.  Spoke to Dr. Sloan Leiter, hospitalist will accept the patient.  The patient appears reasonably stabilized for admission considering the current resources, flow, and capabilities available in the ED at this time, and I doubt any other [facility] requiring further screening and/or treatment in the ED prior to admission.  Final Clinical Impression(s) / ED Diagnoses Final diagnoses:  Bronchopneumonia    Rx / DC Orders ED Discharge Orders    None       Alfredia Client, PA-C 03/28/20 1707    Truddie Hidden, MD 03/29/20 [phone]

## 2020-03-28 NOTE — Progress Notes (Signed)
Pharmacy Antibiotic Note  Erin Jordan is a 74 y.o. female admitted on 03/28/2020 with aspiration pneumonia.  Pharmacy has been consulted for Unasyn dosing.  Plan: Unasyn 3gm IV q12h F/u cxs and clinical progress Monitor V/S, labs  Height: 5\' 2"  (157.5 cm) Weight: 50.3 kg (111 lb) IBW/kg (Calculated) : 50.1  Temp (24hrs), Avg:99.3 F (37.4 C), Min:98.9 F (37.2 C), Max:99.6 F (37.6 C)  Recent Labs  Lab 03/23/20 1558 03/28/20 1017 03/28/20 1208  WBC  --  11.9*  --   CREATININE 2.32* 1.90*  --   LATICACIDVEN  --  2.5* 1.9    Estimated Creatinine Clearance: 19.9 mL/min (A) (by C-G formula based on SCr of 1.9 mg/dL (H)).    No Known Allergies  Antimicrobials this admission: unasyn 12/21 >>  Azithromycin 12/21x1 Ceftriaxone 12/21 x 1   Dose adjustments this admission: prn  Microbiology results: 12/21 BCx: pending  Thank you for allowing pharmacy to be a part of this patient's care.  Isac Sarna, BS [place], [place] Clinical Pharmacist Pager [phone] 03/28/2020 3:04 PM

## 2020-03-28 NOTE — Progress Notes (Signed)
Pt's daughter called in to say pt not feeling well not coming in for preop appt.  Pt's daughter stater she was having " pain in stomach".  Instructed pt's daughter to call her physician regarding. This. Called office of [facility] and spoke with Armen in Triage and informed that pt was having stomach pain today and not coming in for preop per pt's daughter.  Infromed Armen also that cardiology telephone note from 03/23/2020 states they would like DR [facility] ( urology input).

## 2020-03-28 NOTE — Progress Notes (Signed)
Pt arrived from ED alert oriented to self and place. Transferred to bed and now resting. VSS stable

## 2020-03-28 NOTE — H&P (Signed)
HISTORY AND PHYSICAL       PATIENT DETAILS Name: Erin Jordan Age: 74 y.o. Sex: female Date of Birth: September 15, 1945 Admit Date: 03/28/2020 [id number], Shireen Quan, FNP   Patient coming from: Home   CHIEF COMPLAINT:  Fever  HPI: Erin Jordan is a 74 y.o. female with medical history significant of CAD s/p CABG in 2006, stage IV CKD, DM-2, HLD, hypothyroidism, thyroid mass, right renal mass, dementia-who was brought to the ED by EMS for evaluation of fever and weakness.  Due to patient's current mental status-she is unable to provide history-this MD spoke with [place] Erin Jordan-patient's granddaughter (first contact in face sheet)-and obtain further history.  Apparently-patient was in her usual state of health till yesterday-this morning family noted that the patient was very weak-much more confused than her usual baseline.  They noted fever as well.  She apparently had a similar presentation when she had bleeding after renal biopsy earlier this year.  Family was worried-and subsequently EMS was called-and patient was brought to [facility] emergency room-where further evaluation revealed pneumonia.  Patient was noted to be coughing while this MD was interviewing her.  She appears to be pleasantly confused-but able to provide some history-although her thoughts were very tangential.  From what I can tell-she denied any nausea, vomiting or diarrhea.  Denies any abdominal pain.  ED Course:  Confused-imaging positive for pneumonia-given Rocephin/Zithromax-hospitalist service called to admit.   REVIEW OF SYSTEMS:  Unable to obtain given dementia.   ALLERGIES:  No Known Allergies  PAST MEDICAL HISTORY: Past Medical History:  Diagnosis Date   Arthritis    Cancer of kidney ([place])    CKD (chronic kidney disease), stage IV ([place])    Coronary atherosclerosis of native coronary artery 2006   Multivessel status post CABG in [place], graft disease documented  March 2019 with DES to SVG to diagonal   Essential hypertension    Free monoclonal light chain 04/14/2012   History of diabetes mellitus, type II    Hyperlipidemia    NSTEMI (non-ST elevated myocardial infarction) ([place]) 06/24/2017   Renal hematoma    Right renal mass     PAST SURGICAL HISTORY: Past Surgical History:  Procedure Laterality Date   ABDOMINAL HYSTERECTOMY     CORONARY ARTERY BYPASS GRAFT  2006   [place], [place]   CORONARY STENT INTERVENTION N/A 06/25/2017   Procedure: CORONARY STENT INTERVENTION;  Surgeon: Jettie Booze, MD;  Location: [place] [facility];  Service: Cardiovascular;  Laterality: N/A;   IR RADIOLOGIST EVAL & MGMT  07/08/2019   LEFT HEART CATH AND CORS/GRAFTS ANGIOGRAPHY N/A 06/25/2017   Procedure: LEFT HEART CATH AND CORS/GRAFTS ANGIOGRAPHY;  Surgeon: Jettie Booze, MD;  Location: [place] [facility];  Service: Cardiovascular;  Laterality: N/A;    MEDICATIONS AT HOME: Prior to Admission medications   Medication Sig Start Date End Date Taking? Authorizing Provider  amLODipine (NORVASC) 10 MG tablet Take 1 tablet (10 mg total) by mouth daily. 01/13/20  Yes [name], FNP  atorvastatin (LIPITOR) 80 MG tablet Take 1 tablet (80 mg total) by mouth daily. 08/19/19  Yes Hendricks Limes F, FNP  donepezil (ARICEPT) 10 MG tablet Take 1 tablet (10 mg total) by mouth at bedtime. 01/13/20  Yes Hendricks Limes F, FNP  methimazole (TAPAZOLE) 5 MG tablet TAKE ONE TABLET BY MOUTH DAILY. Patient taking differently: Take 5 mg by mouth daily. 03/01/20  Yes Claretta Fraise, MD  nitroGLYCERIN (NITROSTAT)  0.4 MG SL tablet Place 1 tablet (0.4 mg total) under the tongue every 5 (five) minutes x 3 doses as needed for chest pain. 07/07/19  Yes Satira Sark, MD  carvedilol (COREG) 6.25 MG tablet Take 1 tablet (6.25 mg total) by mouth 2 (two) times daily. 03/23/20   Dunn, Nedra Hai, PA-C  cholecalciferol (VITAMIN D) 25 MCG (1000 UT) tablet Take 1,000 Units by  mouth daily.     [provider]    FAMILY HISTORY: Family History  Problem Relation Age of Onset   Heart attack Mother    Hypertension Mother    Seizures Son    Seizures Son    Seizures Daughter      SOCIAL HISTORY:  reports that she has never smoked. She has never used smokeless tobacco. She reports that she does not drink alcohol and does not use drugs.  PHYSICAL EXAM: Blood pressure (!) 180/88, pulse 97, temperature 98.9 F (37.2 C), temperature source Oral, resp. rate 15, height 5\' 2"  (1.575 m), weight 50.3 kg, SpO2 100 %.  General appearance :Awake, alert, not in any distress.  Pleasantly confused.` Eyes:Pink conjunctiva HEENT: Atraumatic and Normocephalic Neck: supple, no JVD.  Resp:Good air entry bilaterally, no added sounds  CVS: S1 S2 regular, no murmurs.  GI: Bowel sounds present, Non tender and not distended with no gaurding, rigidity or rebound. Extremities: B/L Lower Ext shows no edema, both legs are warm to touch Neurology: Nonfocal  LABS ON ADMISSION:  I have personally reviewed following labs and imaging studies  CBC: Recent Labs  Lab 03/28/20 1017  WBC 11.9*  NEUTROABS 10.9*  HGB 15.5*  HCT 46.9*  MCV 93.4  PLT 564    Basic Metabolic Panel: Recent Labs  Lab 03/23/20 1558 03/28/20 1017  NA 141 138  K 4.6 4.0  CL 103 100  CO2 28 23  GLUCOSE 114* 232*  BUN 43* 34*  CREATININE 2.32* 1.90*  CALCIUM 9.5 10.0  MG 2.5*  --     GFR: Estimated Creatinine Clearance: 19.9 mL/min (A) (by C-G formula based on SCr of 1.9 mg/dL (H)).  Liver Function Tests: Recent Labs  Lab 03/28/20 1017  AST 22  ALT 16  ALKPHOS 93  BILITOT 0.7  PROT 8.3*  ALBUMIN 4.9   No results for input(s): LIPASE, AMYLASE in the last 168 hours. No results for input(s): AMMONIA in the last 168 hours.  Coagulation Profile: No results for input(s): INR, PROTIME in the last 168 hours.  Cardiac Enzymes: No results for input(s): CKTOTAL, CKMB, CKMBINDEX,  TROPONINI in the last 168 hours.  BNP (last 3 results) No results for input(s): PROBNP in the last 8760 hours.  HbA1C: No results for input(s): HGBA1C in the last 72 hours.  CBG: No results for input(s): GLUCAP in the last 168 hours.  Lipid Profile: No results for input(s): CHOL, HDL, LDLCALC, TRIG, CHOLHDL, LDLDIRECT in the last 72 hours.  Thyroid Function Tests: No results for input(s): TSH, T4TOTAL, FREET4, T3FREE, THYROIDAB in the last 72 hours.  Anemia Panel: No results for input(s): VITAMINB12, FOLATE, FERRITIN, TIBC, IRON, RETICCTPCT in the last 72 hours.  Urine analysis:    Component Value Date/Time   COLORURINE YELLOW 03/28/2020 1017   APPEARANCEUR CLEAR 03/28/2020 1017   APPEARANCEUR Clear 12/22/2019 1153   LABSPEC 1.014 03/28/2020 1017   PHURINE 5.0 03/28/2020 1017   GLUCOSEU 50 (A) 03/28/2020 1017   HGBUR NEGATIVE 03/28/2020 1017   BILIRUBINUR NEGATIVE 03/28/2020 1017   BILIRUBINUR Negative 12/22/2019  [place] 03/28/2020 1017   PROTEINUR >=300 (A) 03/28/2020 1017   UROBILINOGEN 0.2 05/19/2019 0947   UROBILINOGEN 0.2 06/01/2011 1822   NITRITE NEGATIVE 03/28/2020 1017   LEUKOCYTESUR NEGATIVE 03/28/2020 1017    Sepsis Labs: Lactic Acid, Venous    Component Value Date/Time   LATICACIDVEN 1.9 03/28/2020 1208     Microbiology: Recent Results (from the past 240 hour(s))  Resp Panel by RT-PCR (Flu A&B, Covid) Nasopharyngeal Swab     Status: None   Collection Time: 03/28/20 10:18 AM   Specimen: Nasopharyngeal Swab; Nasopharyngeal(NP) swabs in vial transport medium  Result Value Ref Range Status   SARS Coronavirus 2 by RT PCR NEGATIVE NEGATIVE Final    Comment: (NOTE) SARS-CoV-2 target nucleic acids are NOT DETECTED.  The SARS-CoV-2 RNA is generally detectable in upper respiratory specimens during the acute phase of infection. The lowest concentration of SARS-CoV-2 viral copies this assay can detect is 138 copies/mL. A negative result  does not preclude SARS-Cov-2 infection and should not be used as the sole basis for treatment or other patient management decisions. A negative result may occur with  improper specimen collection/handling, submission of specimen other than nasopharyngeal swab, presence of viral mutation(s) within the areas targeted by this assay, and inadequate number of viral copies(<138 copies/mL). A negative result must be combined with clinical observations, patient history, and epidemiological information. The expected result is Negative.  Fact Sheet for Patients:  EntrepreneurPulse.com.au  Fact Sheet for Healthcare Providers:  IncredibleEmployment.be  This test is no t yet approved or cleared by the [place] FDA and  has been authorized for detection and/or diagnosis of SARS-CoV-2 by FDA under an Emergency Use Authorization (EUA). This EUA will remain  in effect (meaning this test can be used) for the duration of the COVID-19 declaration under Section 564(b)(1) of the Act, 21 U.S.C.section 360bbb-3(b)(1), unless the authorization is terminated  or revoked sooner.       Influenza A by PCR NEGATIVE NEGATIVE Final   Influenza B by PCR NEGATIVE NEGATIVE Final    Comment: (NOTE) The Xpert Xpress SARS-CoV-2/FLU/RSV plus assay is intended as an aid in the diagnosis of influenza from Nasopharyngeal swab specimens and should not be used as a sole basis for treatment. Nasal washings and aspirates are unacceptable for Xpert Xpress SARS-CoV-2/FLU/RSV testing.  Fact Sheet for Patients: EntrepreneurPulse.com.au  Fact Sheet for Healthcare Providers: IncredibleEmployment.be  This test is not yet approved or cleared by the [place] FDA and has been authorized for detection and/or diagnosis of SARS-CoV-2 by FDA under an Emergency Use Authorization (EUA). This EUA will remain in effect (meaning this test can be used) for  the duration of the COVID-19 declaration under Section 564(b)(1) of the Act, 21 U.S.C. section 360bbb-3(b)(1), unless the authorization is terminated or revoked.  Performed at [facility], [address] [place], [place] [address]   Blood culture (routine x 2)     Status: None (Preliminary result)   Collection Time: 03/28/20 11:15 AM   Specimen: BLOOD  Result Value Ref Range Status   Specimen Description BLOOD BLOOD LEFT FOREARM  Final   Special Requests   Final    BOTTLES DRAWN AEROBIC AND ANAEROBIC Blood Culture results may not be optimal due to an inadequate volume of blood received in culture bottles Performed at [facility], [address] [place], [place] [address]    Culture PENDING  Incomplete   Report Status PENDING  Incomplete  Blood culture (routine x 2)  Status: None (Preliminary result)   Collection Time: 03/28/20 12:00 PM   Specimen: BLOOD  Result Value Ref Range Status   Specimen Description BLOOD RIGHT ANTECUBITAL  Final   Special Requests   Final    BOTTLES DRAWN AEROBIC AND ANAEROBIC Blood Culture results may not be optimal due to an inadequate volume of blood received in culture bottles Performed at [facility], [address] [place], [place] [address]    Culture PENDING  Incomplete   Report Status PENDING  Incomplete      RADIOLOGIC STUDIES ON ADMISSION: DG Chest Port 1 View  Result Date: 03/28/2020 CLINICAL DATA:  Fever EXAM: PORTABLE CHEST 1 VIEW COMPARISON:  10/14/2019 FINDINGS: Extensive artifact from EKG leads. Normal heart size and aortic contours. CABG. 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 Accentuated markings behind the heart. No edema, effusion, or pneumothorax. IMPRESSION: Accentuated markings behind the heart, equivocal for bronchopneumonia. Electronically Signed   By: Monte Fantasia M.D.   On: 03/28/2020 11:15    I have personally reviewed images of chest xray: Patchy infiltrates  EKG:  Personally reviewed.   Sinus tachycardia  ASSESSMENT AND PLAN: Community-acquired pneumonia: Febrile-tachycardic-coarse breath sounds all over-we will treat as pneumonia-given history of dementia-some concern for aspiration pneumonia as well.  Check procalcitonin level.  Start Unasyn and Zithromax-obtain SLP eval-await culture data.  Note-patient is not vaccinated for Covid-however influenza/Covid PCR negative.  HTN: Continue amlodipine and Coreg-follow and adjust  HLD: Continue statin  CAD s/p CABG: No anginal symptoms-follows with [facility] heart [facility].  Currently without any anginal symptoms.  CKD stage IV: Creatinine close to baseline-probably due to hypertension-however has significant proteinuria on UA.  Continue outpatient follow-up with PCP/nephrology.  Hypothyroidism: Continue methimazole  Right renal mass/renal cell carcinoma: Being followed by nephrology-she had a biopsy done earlier this year-this was complicated by bleeding from the biopsy site.  Biopsy was positive for renal cell carcinoma.  Thyroid mass: Per family-she is scheduled for thyroid surgery next week.  Dementia: Remains very pleasantly confused-continue Aricept.  Expect some amount of delirium during this hospital stay  Further plan will depend as patient's clinical course evolves and further radiologic and laboratory data become available. Patient will be monitored closely.  Above noted plan was discussed with  Granddaughter Susana Duell [phone]) over the phone-she was in agreement.    CONSULTS: None  DVT Prophylaxis: Prophylactic Heparin  Code Status: DNR-Per granddaughter-patient has existing advanced directives-DNR in place.  Granddaughter agreeable with me placing DNR order.  Disposition Plan:  Discharge back home vs SNF possibly in 2-3 days, depending on clinical course  Admission status:  Inpatient v going to tele   The medical decision making on this patient was of high complexity and the patient is at high risk  for clinical deterioration, therefore this is a level 3 visit.   Total time spent  55 minutes.Greater than 50% of this time was spent in counseling, explanation of diagnosis, planning of further management, and coordination of care.  Severity of illness: The appropriate patient status for this patient is INPATIENT. Inpatient status is judged to be reasonable and necessary in order to provide the required intensity of service to ensure the patient's safety. The patient's presenting symptoms, physical exam findings, and initial radiographic and laboratory data in the context of their chronic comorbidities is felt to place them at high risk for further clinical deterioration. Furthermore, it is not anticipated that the patient will be medically stable for discharge from the  hospital within 2 midnights of admission. The following factors support the patient status of inpatient.   " The patient's presenting symptoms include fever " The worrisome physical exam findings include fever, confusion " The initial radiographic and laboratory data are worrisome because of x-ray positive for pneumonia " The chronic co-morbidities include dementia, CKD stage IV, CAD s/p CABG, thyroid mass, renal cell carcinoma of the right  * I certify that at the point of admission it is my clinical judgment that the patient will require inpatient hospital care spanning beyond 2 midnights from the point of admission due to high intensity of service, high risk for further deterioration and high frequency of surveillance required.**  Orlandus Borowski [facility] Pager [phone]  If 7PM-7AM, please contact night-coverage  Please page via [email or web address]  Go to [email or web address] and use [place]'s universal password to access. If you do not have the password, please contact the hospital operator.  Locate the [facility] provider you are looking for under [facility] and page to a number that you can be directly reached. If you  still have difficulty reaching the provider, please page the [facility] (Director on Call) for the Hospitalists listed on amion for assistance.  03/28/2020, 2:26 PM

## 2020-03-28 NOTE — Progress Notes (Signed)
Called daughter back to complete hx via phone and pt come in one day next week for labs.  Pt just taken by ambulance to [facility] with faver of 102 and glucose of 267 per daughter.  Informed daughter will watch and follow pt and will let [facility] be aware of above.  Called [facility] and spoke with [facility] and made her aware of above.

## 2020-03-29 ENCOUNTER — Other Ambulatory Visit: Payer: Self-pay

## 2020-03-29 ENCOUNTER — Telehealth: Payer: Self-pay

## 2020-03-29 ENCOUNTER — Encounter (HOSPITAL_COMMUNITY): Payer: Self-pay | Admitting: Internal Medicine

## 2020-03-29 LAB — COMPREHENSIVE METABOLIC PANEL
ALT: 11 U/L (ref 0–44)
AST: 16 U/L (ref 15–41)
Albumin: 3.5 g/dL (ref 3.5–5.0)
Alkaline Phosphatase: 79 U/L (ref 38–126)
Anion gap: 10 (ref 5–15)
BUN: 32 mg/dL — ABNORMAL HIGH (ref 8–23)
CO2: 21 mmol/L — ABNORMAL LOW (ref 22–32)
Calcium: 9 mg/dL (ref 8.9–10.3)
Chloride: 109 mmol/L (ref 98–111)
Creatinine, Ser: 1.87 mg/dL — ABNORMAL HIGH (ref 0.44–1.00)
GFR, Estimated: 28 mL/min — ABNORMAL LOW (ref >60)
Glucose, Bld: 212 mg/dL — ABNORMAL HIGH (ref 70–99)
Potassium: 3.7 mmol/L (ref 3.5–5.1)
Sodium: 140 mmol/L (ref 135–145)
Total Bilirubin: 0.6 mg/dL (ref 0.3–1.2)
Total Protein: 6.3 g/dL — ABNORMAL LOW (ref 6.5–8.1)

## 2020-03-29 LAB — CBC
HCT: 45.1 % (ref 36.0–46.0)
Hemoglobin: 14.7 g/dL (ref 12.0–15.0)
MCH: 30.3 pg (ref 26.0–34.0)
MCHC: 32.6 g/dL (ref 30.0–36.0)
MCV: 93 fL (ref 80.0–100.0)
Platelets: 277 10*3/uL (ref 150–400)
RBC: 4.85 MIL/uL (ref 3.87–5.11)
RDW: 12.6 % (ref 11.5–15.5)
WBC: 12.9 10*3/uL — ABNORMAL HIGH (ref 4.0–10.5)
nRBC: 0 % (ref 0.0–0.2)

## 2020-03-29 LAB — GLUCOSE, CAPILLARY: Glucose-Capillary: 158 mg/dL — ABNORMAL HIGH (ref 70–99)

## 2020-03-29 MED ORDER — AZITHROMYCIN 250 MG PO TABS
500.0000 mg | ORAL_TABLET | Freq: Every day | ORAL | Status: DC
Start: 2020-03-30 — End: 2020-03-30
  Administered 2020-03-30: 500 mg via ORAL
  Filled 2020-03-29: qty 2

## 2020-03-29 MED ORDER — LORAZEPAM 2 MG/ML IJ SOLN: 1.0000 mg | Freq: Two times a day (BID) | INTRAMUSCULAR | Status: DC | PRN

## 2020-03-29 MED ORDER — AMOXICILLIN-POT CLAVULANATE 500-125 MG PO TABS
1.0000 | ORAL_TABLET | Freq: Two times a day (BID) | ORAL | Status: DC
  Administered 2020-03-29 – 2020-03-30 (×2): 500 mg via ORAL
  Filled 2020-03-29 (×2): qty 1

## 2020-03-29 MED ORDER — TRAZODONE HCL 50 MG PO TABS
50.0000 mg | ORAL_TABLET | Freq: Every day | ORAL | Status: DC
  Administered 2020-03-29: 50 mg via ORAL
  Filled 2020-03-29: qty 1

## 2020-03-29 MED ORDER — AZITHROMYCIN 250 MG PO TABS: 500.0000 mg | ORAL_TABLET | Freq: Every day | ORAL | Status: DC

## 2020-03-29 NOTE — Telephone Encounter (Signed)
noted 

## 2020-03-29 NOTE — TOC Initial Note (Signed)
Transition of Care [facility]) - Initial/Assessment Note    Patient Details  Name: Erin Jordan MRN: [id number] Date of Birth: [birth year]  [facility] CM/SW Contact:    Boneta Lucks, RN Phone Number: 03/29/2020, 4:20 PM  Clinical Narrative:      Patient admitted with Pneumonia. PT is recommending home health. TOC spoke with Rozanna Boer- daughter she is agreeing. They used [facility] in the past. Georgina Snell with [facility] will accept the referral for HHPT.  Added to AVS.              Expected Discharge Plan: [place] Barriers to Discharge: Continued Medical Work up   Patient Goals and CMS Choice Patient states their goals for this hospitalization and ongoing recovery are:: to return home. CMS Medicare.gov Compare Post Acute Care list provided to:: Patient Represenative (must comment) Choice offered to / list presented to : Adult Children  Expected Discharge Plan and Services Expected Discharge Plan: [place] Arranged: PT [facility] Agency: [place] Date [facility] Agency Contacted: 03/29/20 Time HH Agency Contacted: 72 Representative spoke with at [place]: Georgina Snell  Prior Living Arrangements/Services   Lives with:: Self   Do you feel safe going back to the place where you live?: Yes      Need for Family Participation in Patient Care: Yes (Comment) Care giver support system in place?: Yes (comment)   Criminal Activity/Legal Involvement Pertinent to Current Situation/Hospitalization: No - Comment as needed  Activities of Daily Living Home Assistive Devices/Equipment: Walker (specify type) ADL Screening (condition at time of admission) Patient's cognitive ability adequate to safely complete daily activities?: Yes Is the patient deaf or have difficulty hearing?: No Does the patient have difficulty seeing, even when wearing glasses/contacts?: No Does the patient have difficulty concentrating, remembering, or making decisions?:  Yes Patient able to express need for assistance with ADLs?: Yes Does the patient have difficulty dressing or bathing?: Yes Independently performs ADLs?: No Communication: Independent Dressing (OT): Needs assistance Is this a change from baseline?: Pre-admission baseline Grooming: Needs assistance Is this a change from baseline?: Pre-admission baseline Feeding: Independent Bathing: Needs assistance Is this a change from baseline?: Pre-admission baseline Toileting: Needs assistance Is this a change from baseline?: Pre-admission baseline In/Out Bed: Independent with device (comment) Walks in Home: Independent with device (comment) Does the patient have difficulty walking or climbing stairs?: Yes Weakness of Legs: Both Weakness of Arms/Hands: None  Permission Sought/Granted     Emotional Assessment    Alcohol / Substance Use: Not Applicable Psych Involvement: No (comment)  Admission diagnosis:  Bronchopneumonia [J18.0] PNA (pneumonia) [J18.9] Fever, unspecified fever cause [R50.9] Patient Active Problem List   Diagnosis Date Noted  . PNA (pneumonia) 03/28/2020  . Renal cell carcinoma ([place]) 08/02/2019  . Acute blood loss anemia 08/02/2019  . RLS (restless legs syndrome) 01/25/2019  . Right renal mass 01/25/2019  . Splenic mass 01/25/2019  . Blood in stool 01/25/2019  . Dementia without behavioral disturbance ([place]) 01/25/2019  . Hyperthyroidism 09/14/2018  . Osteopenia 05/20/2018  . Multinodular goiter 10/14/2017  . Weight loss, unintentional 07/14/2017  . CAD -S/P PCI 06/26/17 06/27/2017  . 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 Hx of CABG- 2006   . History of diabetes mellitus   . Essential hypertension   . Stage 3 chronic kidney disease ([place])   . Hypokalemia 06/02/2011  . Dyslipidemia, goal LDL below 70 06/02/2011   PCP:  [name], FNP Pharmacy:   [facility] [address] [address] Phone:  [phone] Fax: [phone]   Readmission Risk Interventions Readmission Risk Prevention Plan 03/29/2020  Transportation Screening Complete  PCP or Specialist Appt within 5-7 Days Not Complete  Home Care Screening Complete  Medication Review (RN CM) Complete  Some recent data might be hidden

## 2020-03-29 NOTE — Plan of Care (Signed)
  Problem: Acute Rehab PT Goals(only PT should resolve) Goal: Pt Will Go Supine/Side To Sit Outcome: Progressing Flowsheets (Taken 03/29/2020 1427) Pt will go Supine/Side to Sit: with modified independence Goal: Patient Will Transfer Sit To/From Stand Outcome: Progressing Flowsheets (Taken 03/29/2020 1427) Patient will transfer sit to/from stand: with modified independence Goal: Pt Will Transfer Bed To Chair/Chair To Bed Outcome: Progressing Goal: Pt Will Ambulate Outcome: Progressing Flowsheets (Taken 03/29/2020 1427) Pt will Ambulate:  50 feet  with supervision  with least restrictive assistive device   2:27 PM, 03/29/20 Lonell Grandchild, MPT Physical Therapist with [facility] [phone] office [phone] mobile phone

## 2020-03-29 NOTE — Evaluation (Signed)
Clinical/Bedside Swallow Evaluation Patient Details  Name: Erin Jordan MRN: [id number] Date of Birth: 1945/11/01  Today's Date: 03/29/2020 Time: SLP Start Time (ACUTE ONLY): 4970 SLP Stop Time (ACUTE ONLY): 0850 SLP Time Calculation (min) (ACUTE ONLY): 15 min  Past Medical History:  Past Medical History:  Diagnosis Date  . Arthritis   . Cancer of kidney ([place])   . CKD (chronic kidney disease), stage IV ([place])   . Coronary atherosclerosis of native coronary artery 2006   Multivessel status post CABG in [place], graft disease documented March 2019 with DES to SVG to diagonal  . Essential hypertension   . 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 History of diabetes mellitus, type II   . Hyperlipidemia   . NSTEMI (non-ST elevated myocardial infarction) ([place]) 06/24/2017  . Renal hematoma   . Right renal mass    Past Surgical History:  Past Surgical History:  Procedure Laterality Date  . ABDOMINAL HYSTERECTOMY    . CORONARY ARTERY BYPASS GRAFT  2006   [place], [place]  . CORONARY STENT INTERVENTION N/A 06/25/2017   Procedure: CORONARY STENT INTERVENTION;  Surgeon: Jettie Booze, MD;  Location: [facility];  Service: Cardiovascular;  Laterality: N/A;  . IR RADIOLOGIST EVAL & MGMT  07/08/2019  . LEFT HEART CATH AND CORS/GRAFTS ANGIOGRAPHY N/A 06/25/2017   Procedure: LEFT HEART CATH AND CORS/GRAFTS ANGIOGRAPHY;  Surgeon: Jettie Booze, MD;  Location: [facility];  Service: Cardiovascular;  Laterality: N/A;   HPI:  74 y.o. female with medical history significant of CAD s/p CABG in 2006, stage IV CKD, DM-2, HLD, hypothyroidism, thyroid mass, right renal mass, dementia-who was brought to the ED by EMS for evaluation of fever and weakness CXR on 03/28/20 indicated Accentuated markings behind the heart, equivocal for bronchopneumonia; BSE generated to assess swallow function.  Assessment / Plan / Recommendation Clinical Impression  Pt seen for clinical  swallowing evaluation to assess swallow function and r/o aspiration.  Pt consumed breakfast tray with orientation to self only and presenting pleasantly confused during assessment.  Normal aging swallow (presbyphagia) noted during consumption as pt exhibited a mild delay in the initiation of the swallow and impaired mastication (due to missing dentition only). Cognitive impairment affecting attention while consuming POs as pt would speak during eating and hold boluses in mouth briefly before initiating a swallow without min-mod verbal cueing provided. Pt also needed assistance initiating consuming POs as pt was confused about what meal she was eating and how to prepare food to consume.  Pt did not exhibit overt s/s of aspiration with any consistency ranging from thin via straw/cup, puree to solids during meal.  Recommend continuation of regular/thin liquid diet with general swallowing precautions and intermittent supervision for compensatory strategies (ie: slow rate, addressing inattention, small bites/sips). ST will s/o at this time.  Thank you for this consult. SLP Visit Diagnosis: Dysphagia, unspecified (R13.10)    Aspiration Risk  Mild aspiration risk only d/t cognitive impairment   Diet Recommendation Regular;Thin liquid   Liquid Administration via: Cup;Straw Medication Administration: Whole meds with liquid Supervision: Patient able to self feed;Intermittent supervision to cue for compensatory strategies Compensations: Slow rate;Small sips/bites Postural Changes: Seated upright at 90 degrees    Other  Recommendations Oral Care Recommendations: Oral care BID   Follow up Recommendations None      Frequency and Duration  (Evaluation only)          Prognosis Prognosis for Safe Diet Advancement: Good  Swallow Study   General Date of Onset: 03/28/20 HPI: 74 y.o. female with medical history significant of CAD s/p CABG in 2006, stage IV CKD, DM-2, HLD, hypothyroidism, thyroid mass,  right renal mass, dementia-who was brought to the ED by EMS for evaluation of fever and weakness Type of Study: Bedside Swallow Evaluation Diet Prior to this Study: Regular;Thin liquids Temperature Spikes Noted: Yes (low grade 99) Respiratory Status: Room air History of Recent Intubation: No Behavior/Cognition: Cooperative;Pleasant mood;Confused Oral Cavity Assessment: Within Functional Limits Oral Care Completed by SLP: Other (Comment) (Pt consuming breakfast tray when SLP entered for BSE) Oral Cavity - Dentition: Poor condition;Missing dentition Vision: Functional for self-feeding Self-Feeding Abilities: Able to feed self;Needs assist Patient Positioning: Upright in bed Baseline Vocal Quality: Hoarse (Min) Volitional Cough: Strong Volitional Swallow: Able to elicit    Oral/Motor/Sensory Function Overall Oral Motor/Sensory Function: Within functional limits   Ice Chips Ice chips: Not tested   Thin Liquid Thin Liquid: Within functional limits Presentation: Straw;Self Fed    Nectar Thick Nectar Thick Liquid: Not tested   Honey Thick Honey Thick Liquid: Not tested   Puree Puree: Not tested   Solid     Solid: Within functional limits Presentation: Self Fed Other Comments: impaired mastication, adequate for diet (primarily d/t missing dentition)      Elvina Sidle, M.S., CCC-SLP 03/29/2020,8:59 AM

## 2020-03-29 NOTE — Evaluation (Signed)
Physical Therapy Evaluation Patient Details Name: Erin Jordan MRN: [id number] DOB: [birth year] Today's Date: 03/29/2020   History of Present Illness  Erin Jordan is a 74 y.o. female with medical history significant of CAD s/p CABG in 2006, stage IV CKD, DM-2, HLD, hypothyroidism, thyroid mass, right renal mass, dementia-who was brought to the ED by EMS for evaluation of fever and weakness.  Due to patient's current mental status-she is unable to provide history-this MD spoke with [place]-patient's granddaughter (first contact in face sheet)-and obtain further history.    Clinical Impression  Patient functioning near baseline for functional mobility and gait, demonstrates slightly labored cadence when taking steps in room without loss of balance and tolerated sitting up in chair after therapy.  Patient pulled out her IV while seated at bedside possibly due to confusion - RN aware.  Patient will benefit from continued physical therapy in hospital and recommended venue below to increase strength, balance, endurance for safe ADLs and gait.     Follow Up Recommendations Home health PT    Equipment Recommendations  None recommended by PT    Recommendations for Other Services       Precautions / Restrictions Precautions Precautions: Fall Restrictions Weight Bearing Restrictions: No      Mobility  Bed Mobility Overal bed mobility: Needs Assistance Bed Mobility: Supine to Sit;Sit to Supine     Supine to sit: Supervision Sit to supine: Supervision   General bed mobility comments: increased time    Transfers Overall transfer level: Needs assistance Equipment used: None;1 person hand held assist Transfers: Sit to/from Stand;Stand Pivot Transfers Sit to Stand: Supervision Stand pivot transfers: Supervision       General transfer comment: increased time, labored movement  Ambulation/Gait Ambulation/Gait assistance: Supervision Gait Distance (Feet): 20  Feet Assistive device: None Gait Pattern/deviations: Decreased step length - right;Decreased step length - left;Decreased stride length Gait velocity: decreased   General Gait Details: slow labored cadence without loss of balance, limited mostly due to self limiting behavior  Stairs            Wheelchair Mobility    Modified Rankin (Stroke Patients Only)       Balance Overall balance assessment: Mild deficits observed, not formally tested                                           Pertinent Vitals/Pain Pain Assessment: No/denies pain    Home Living Family/patient expects to be discharged to:: Private residence Living Arrangements: Children Available Help at Discharge: Family;Available PRN/intermittently Type of Home: Mobile home Home Access: Stairs to enter Entrance Stairs-Rails: Right;Left;Can reach both Entrance Stairs-Number of Steps: 5 Home Layout: One level Home Equipment: Walker - 2 wheels;Cane - single point;Shower seat;Bedside commode;Wheelchair - manual      Prior Function Level of Independence: Independent         Comments: household and short distanced Estate agent Dominance   Dominant Hand: Right    Extremity/Trunk Assessment   Upper Extremity Assessment Upper Extremity Assessment: Defer to OT evaluation    Lower Extremity Assessment Lower Extremity Assessment: Generalized weakness    Cervical / Trunk Assessment Cervical / Trunk Assessment: Normal  Communication   Communication: Other (comment) (Patient easily distracted and appears confused)  Cognition Arousal/Alertness: Awake/alert Behavior During Therapy: Impulsive;Agitated Overall Cognitive Status: History of cognitive impairments - at baseline  General Comments      Exercises     Assessment/Plan    PT Assessment Patient needs continued PT services  PT Problem List Decreased  strength;Decreased activity tolerance;Decreased balance;Decreased mobility       PT Treatment Interventions Balance training;DME instruction;Gait training;Stair training;Functional mobility training;Therapeutic activities;Therapeutic exercise;Patient/family education    PT Goals (Current goals can be found in the Care Plan section)  Acute Rehab PT Goals Patient Stated Goal: return home with family to assist PT Goal Formulation: With patient Time For Goal Achievement: 03/31/20 Potential to Achieve Goals: Good    Frequency Min 2X/week   Barriers to discharge        Co-evaluation               AM-PAC PT "6 Clicks" Mobility  Outcome Measure Help needed turning from your back to your side while in a flat bed without using bedrails?: None Help needed moving from lying on your back to sitting on the side of a flat bed without using bedrails?: None Help needed moving to and from a bed to a chair (including a wheelchair)?: A Little Help needed standing up from a chair using your arms (e.g., wheelchair or bedside chair)?: A Little Help needed to walk in hospital room?: A Little Help needed climbing 3-5 steps with a railing? : A Little 6 Click Score: 20    End of Session   Activity Tolerance: Patient tolerated treatment well;Patient limited by fatigue;Treatment limited secondary to agitation Patient left: in chair;with call bell/phone within reach;with chair alarm set Nurse Communication: Mobility status PT Visit Diagnosis: Unsteadiness on feet (R26.81);Other abnormalities of gait and mobility (R26.89);Muscle weakness (generalized) (M62.81)    Time: 4462-8638 PT Time Calculation (min) (ACUTE ONLY): 22 min   Charges:   PT Evaluation $PT Eval Moderate Complexity: 1 Mod PT Treatments $Therapeutic Activity: 8-22 mins        2:25 PM, 03/29/20 Lonell Grandchild, MPT Physical Therapist with [facility] [phone] office [phone] mobile phone

## 2020-03-29 NOTE — Progress Notes (Signed)
Patient Demographics:    Erin Jordan, is a 74 y.o. female, DOB - December 12, 1945, KYH:[id number]  Admit date - 03/28/2020   Admitting Physician Evalee Mutton Kristeen Mans, MD  Outpatient Primary MD for the patient is [name], FNP  LOS - 1   Chief Complaint  Patient presents with  . Fever        Subjective:    Erin Jordan today has no fevers, no emesis,  No chest pain,   -Confused and disoriented, pulling out IV lines, -Cough and shortness of breath persist  Assessment  & Plan :    Active Problems:   PNA (pneumonia)   Brief Summary:- 74 y.o. female with medical history significant of CAD s/p CABG in 2006, stage IV CKD, DM-2, HLD, hypothyroidism, thyroid mass, right renal mass, dementia admitted on 03/28/20 for Pneumonia--  A/p 1)CAP--- cough and respiratory symptoms improving -Patient was receiving IV calcium but has been pulling out her IVs -If patient continues to pull out IV may switch to p.o. Augmentin and azithromycin -WBC is 12.9 -Continue bronchodilators  2)CKD IV--- -creatinine currently stable around 1.9 which is close to patient's baseline - renally adjust medications, avoid nephrotoxic agents / dehydration  / hypotension  3)Social/Ethics--- DNR, Discussed with grand-daughter Erin Jordan [phone], questions answered  4) advanced dementia behavioral disturbance--- give trazodone nightly, may use lorazepam as needed -Patient has been pulling out IVs -Continue Aricept  5)H/o CAD/ s/p prior CABG /HTN-stable, no ACS type symptoms continue carvedilol, Lipitor and amlodipine  6)Hyperthyroidism: Continue methimazole, tachycardia noted, continue Coreg -Check TSH  7)Right renal mass/renal cell carcinoma: Being followed by nephrology-she had a biopsy done earlier this year-this was complicated by bleeding from the biopsy site.  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 -  8)Thyroid mass: Per family-she is scheduled for thyroid surgery next week.  9) generalized weakness and deconditioning--- physical therapist recommends home health  Disposition/Need for in-Hospital Stay- patient unable to be discharged at this time due to --pneumonia requiring iv antibiotic therapy  Status is: Inpatient  Remains inpatient appropriate because:Shortness of breath   Disposition: The patient is from: Home              Anticipated d/c is to: Home              Anticipated d/c date is: 1 day              Patient currently is not medically stable to d/c. Barriers: Not Clinically Stable-   Code Status :  -  Code Status: DNR   Family Communication:   Discussed with grand-daughter Erin Jordan [phone], questions answered  Consults  :  na  DVT Prophylaxis  :   - SCDs   heparin injection 5,000 Units Start: 03/28/20 2200  Lab Results  Component Value Date   PLT 277 03/29/2020    Inpatient Medications  Scheduled Meds: . amLODipine  10 mg Oral Daily  . amoxicillin-clavulanate  1 tablet Oral Q12H  . atorvastatin  80 mg Oral Daily  . azithromycin  500 mg Oral Daily  . carvedilol  6.25 mg Oral BID  . cholecalciferol  1,000 Units Oral Daily  . donepezil  10 mg Oral QHS  . heparin  5,000 Units Subcutaneous Q8H  .  methimazole  5 mg Oral Daily  . traZODone  50 mg Oral QHS   Continuous Infusions: PRN Meds:.acetaminophen **OR** acetaminophen, hydrALAZINE, ipratropium-albuterol, LORazepam, nitroGLYCERIN, ondansetron **OR** ondansetron (ZOFRAN) IV, polyethylene glycol    Anti-infectives (From admission, onward)   Start     Dose/Rate Route Frequency Ordered Stop   03/29/20 2200  amoxicillin-clavulanate (AUGMENTIN) 875-125 MG per tablet 1 tablet        1 tablet Oral Every 12 hours 03/29/20 1757     03/29/20 1845  azithromycin (ZITHROMAX) tablet 500 mg        500 mg Oral Daily 03/29/20 1757     03/29/20 1400  azithromycin (ZITHROMAX) 500 mg in  sodium chloride 0.9 % 250 mL IVPB  Status:  Discontinued        500 mg 250 mL/hr over 60 Minutes Intravenous Every 24 hours 03/28/20 1828 03/29/20 1757   03/28/20 1600  Ampicillin-Sulbactam (UNASYN) 3 g in sodium chloride 0.9 % 100 mL IVPB  Status:  Discontinued        3 g 200 mL/hr over 30 Minutes Intravenous Every 12 hours 03/28/20 1506 03/29/20 1757   03/28/20 1230  cefTRIAXone (ROCEPHIN) 1 g in sodium chloride 0.9 % 100 mL IVPB        1 g 200 mL/hr over 30 Minutes Intravenous  Once 03/28/20 1224 03/28/20 1404   03/28/20 1230  azithromycin (ZITHROMAX) 500 mg in sodium chloride 0.9 % 250 mL IVPB        500 mg 250 mL/hr over 60 Minutes Intravenous  Once 03/28/20 1224 03/28/20 1543        Objective:   Vitals:   03/28/20 2037 03/29/20 0220 03/29/20 0517 03/29/20 1619  BP: (!) 178/91 (!) 151/75 (!) 147/83 (!) 149/78  Pulse: (!) 110 (!) 108 (!) 101 (!) 102  Resp: 20 20 19 18   Temp: 98.4 F (36.9 C) 98.9 F (37.2 C) 98 F (36.7 C) 98.1 F (36.7 C)  TempSrc:  Oral    SpO2: 100% 100% 100% 97%  Weight:      Height:        Wt Readings from Last 3 Encounters:  03/28/20 50.3 kg  03/23/20 49.4 kg  02/21/20 49 kg     Intake/Output Summary (Last 24 hours) at 03/29/2020 1800 Last data filed at 03/29/2020 0500 Gross per 24 hour  Intake 399.56 ml  Output --  Net 399.56 ml   Physical Exam  Gen:- Awake Alert,  , confused, disoriented HEENT:- Erin Jordan.AT, No sclera icterus Neck-Supple Neck,No JVD,.  Lungs-diminished breath sounds, no rales CV- S1, S2 normal, regular  Abd-  +ve B.Sounds, Abd Soft, No tenderness,    Extremity/Skin:- No  edema, pedal pulses present  Psych-Baseline cognitive and memory deficits, confusion and disorientation persist  neuro-generalized weakness, no new focal deficits, no tremors   Data Review:   Micro Results Recent Results (from the past 240 hour(s))  Resp Panel by RT-PCR (Flu A&B, Covid) Nasopharyngeal Swab     Status: None   Collection Time:  03/28/20 10:18 AM   Specimen: Nasopharyngeal Swab; Nasopharyngeal(NP) swabs in vial transport medium  Result Value Ref Range Status   SARS Coronavirus 2 by RT PCR NEGATIVE NEGATIVE Final    Comment: (NOTE) SARS-CoV-2 target nucleic acids are NOT DETECTED.  The SARS-CoV-2 RNA is generally detectable in upper respiratory specimens during the acute phase of infection. The lowest concentration of SARS-CoV-2 viral copies this assay can detect is 138 copies/mL. A negative result does not preclude SARS-Cov-2 infection  and should not be used as the sole basis for treatment or other patient management decisions. A negative result may occur with  improper specimen collection/handling, submission of specimen other than nasopharyngeal swab, presence of viral mutation(s) within the areas targeted by this assay, and inadequate number of viral copies(<138 copies/mL). A negative result must be combined with clinical observations, patient history, and epidemiological information. The expected result is Negative.  Fact Sheet for Patients:  EntrepreneurPulse.com.au  Fact Sheet for Healthcare Providers:  IncredibleEmployment.be  This test is no t yet approved or cleared by the [place] FDA and  has been authorized for detection and/or diagnosis of SARS-CoV-2 by FDA under an Emergency Use Authorization (EUA). This EUA will remain  in effect (meaning this test can be used) for the duration of the COVID-19 declaration under Section 564(b)(1) of the Act, 21 U.S.C.section 360bbb-3(b)(1), unless the authorization is terminated  or revoked sooner.       Influenza A by PCR NEGATIVE NEGATIVE Final   Influenza B by PCR NEGATIVE NEGATIVE Final    Comment: (NOTE) The Xpert Xpress SARS-CoV-2/FLU/RSV plus assay is intended as an aid in the diagnosis of influenza from Nasopharyngeal swab specimens and should not be used as a sole basis for treatment. Nasal washings  and aspirates are unacceptable for Xpert Xpress SARS-CoV-2/FLU/RSV testing.  Fact Sheet for Patients: EntrepreneurPulse.com.au  Fact Sheet for Healthcare Providers: IncredibleEmployment.be  This test is not yet approved or cleared by the [place] FDA and has been authorized for detection and/or diagnosis of SARS-CoV-2 by FDA under an Emergency Use Authorization (EUA). This EUA will remain in effect (meaning this test can be used) for the duration of the COVID-19 declaration under Section 564(b)(1) of the Act, 21 U.S.C. section 360bbb-3(b)(1), unless the authorization is terminated or revoked.  Performed at [facility], [address], [place], [place] [address]   Blood culture (routine x 2)     Status: None (Preliminary result)   Collection Time: 03/28/20 11:15 AM   Specimen: BLOOD  Result Value Ref Range Status   Specimen Description BLOOD BLOOD LEFT FOREARM  Final   Special Requests   Final    BOTTLES DRAWN AEROBIC AND ANAEROBIC Blood Culture results may not be optimal due to an inadequate volume of blood received in culture bottles   Culture   Final    NO GROWTH < 24 HOURS Performed at [facility], [address], [place], [place] [address]    Report Status PENDING  Incomplete  Blood culture (routine x 2)     Status: None (Preliminary result)   Collection Time: 03/28/20 12:00 PM   Specimen: BLOOD  Result Value Ref Range Status   Specimen Description BLOOD RIGHT ANTECUBITAL  Final   Special Requests   Final    BOTTLES DRAWN AEROBIC AND ANAEROBIC Blood Culture results may not be optimal due to an inadequate volume of blood received in culture bottles   Culture   Final    NO GROWTH < 24 HOURS Performed at [facility], [address] [place], [place] [address]    Report Status PENDING  Incomplete    Radiology Reports DG Chest Port 1 View  Result Date: 03/28/2020 CLINICAL DATA:  Fever EXAM: PORTABLE CHEST 1 VIEW COMPARISON:   10/14/2019 FINDINGS: Extensive artifact from EKG leads. Normal heart size and aortic contours. CABG. 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 Accentuated markings behind the heart. No edema, effusion, or pneumothorax. IMPRESSION: Accentuated markings behind the  heart, equivocal for bronchopneumonia. Electronically Signed   By: Monte Fantasia M.D.   On: 03/28/2020 11:15     CBC Recent Labs  Lab 03/28/20 1017 03/29/20 0532  WBC 11.9* 12.9*  HGB 15.5* 14.7  HCT 46.9* 45.1  PLT 281 277  MCV 93.4 93.0  MCH 30.9 30.3  MCHC 33.0 32.6  RDW 12.6 12.6  LYMPHSABS 0.3*  --   MONOABS 0.7  --   EOSABS 0.0  --   BASOSABS 0.0  --     Chemistries  Recent Labs  Lab 03/23/20 1558 03/28/20 1017 03/29/20 0532  NA 141 138 140  K 4.6 4.0 3.7  CL 103 100 109  CO2 28 23 21*  GLUCOSE 114* 232* 212*  BUN 43* 34* 32*  CREATININE 2.32* 1.90* 1.87*  CALCIUM 9.5 10.0 9.0  MG 2.5*  --   --   AST  --  22 16  ALT  --  16 11  ALKPHOS  --  93 79  BILITOT  --  0.7 0.6   ------------------------------------------------------------------------------------------------------------------ No results for input(s): CHOL, HDL, LDLCALC, TRIG, CHOLHDL, LDLDIRECT in the last 72 hours.  Lab Results  Component Value Date   HGBA1C 6.1 01/21/2020   ------------------------------------------------------------------------------------------------------------------ No results for input(s): TSH, T4TOTAL, T3FREE, THYROIDAB in the last 72 hours.  Invalid input(s): FREET3 ------------------------------------------------------------------------------------------------------------------ No results for input(s): VITAMINB12, FOLATE, FERRITIN, TIBC, IRON, RETICCTPCT in the last 72 hours.  Coagulation profile No results for input(s): INR, PROTIME in the last 168 hours.  No results for input(s): DDIMER in the last 72 hours.  Cardiac Enzymes No results for input(s): CKMB, TROPONINI, MYOGLOBIN in  the last 168 hours.  Invalid input(s): CK ------------------------------------------------------------------------------------------------------------------ No results found for: BNP   Roxan Hockey M.D on 03/29/2020 at 6:00 PM  Go to [email or web address] - for contact info  [facility] - Office  [phone]

## 2020-03-29 NOTE — Progress Notes (Signed)
OT Cancellation Note  Patient Details Name: Erin Jordan MRN: [id number] DOB: 03-17-1946   Cancelled Treatment:    Reason Eval/Treat Not Completed: Patient at procedure or test/ unavailable. Pt just received breakfast and working with SLP. Will check back at a later time.   Guadelupe Sabin, OTR/L  [phone] 03/29/2020, 9:19 AM

## 2020-03-30 ENCOUNTER — Telehealth: Payer: Self-pay

## 2020-03-30 DIAGNOSIS — N184 Chronic kidney disease, stage 4 (severe): Secondary | ICD-10-CM | POA: Diagnosis present

## 2020-03-30 LAB — BLOOD CULTURE ID PANEL (REFLEXED) - BCID2

## 2020-03-30 LAB — CBC
HCT: 44.4 % (ref 36.0–46.0)
Hemoglobin: 14.4 g/dL (ref 12.0–15.0)
MCH: 31.2 pg (ref 26.0–34.0)
MCHC: 32.4 g/dL (ref 30.0–36.0)
MCV: 96.1 fL (ref 80.0–100.0)
Platelets: 231 10*3/uL (ref 150–400)
RBC: 4.62 MIL/uL (ref 3.87–5.11)
RDW: 13 % (ref 11.5–15.5)
WBC: 13.3 10*3/uL — ABNORMAL HIGH (ref 4.0–10.5)
nRBC: 0 % (ref 0.0–0.2)

## 2020-03-30 LAB — BASIC METABOLIC PANEL
Anion gap: 13 (ref 5–15)
BUN: 33 mg/dL — ABNORMAL HIGH (ref 8–23)
CO2: 24 mmol/L (ref 22–32)
Calcium: 9.6 mg/dL (ref 8.9–10.3)
Chloride: 105 mmol/L (ref 98–111)
Creatinine, Ser: 2.13 mg/dL — ABNORMAL HIGH (ref 0.44–1.00)
GFR, Estimated: 24 mL/min — ABNORMAL LOW (ref >60)
Glucose, Bld: 173 mg/dL — ABNORMAL HIGH (ref 70–99)
Potassium: 3.4 mmol/L — ABNORMAL LOW (ref 3.5–5.1)
Sodium: 142 mmol/L (ref 135–145)

## 2020-03-30 MED ORDER — METHIMAZOLE 5 MG PO TABS: 5.0000 mg | ORAL_TABLET | Freq: Every day | ORAL | 5 refills | Status: DC

## 2020-03-30 MED ORDER — TRAZODONE HCL 50 MG PO TABS: 50.0000 mg | ORAL_TABLET | Freq: Every day | ORAL | 5 refills | Status: DC

## 2020-03-30 MED ORDER — AMOXICILLIN-POT CLAVULANATE 500-125 MG PO TABS: 1.0000 | ORAL_TABLET | Freq: Two times a day (BID) | ORAL | 0 refills | Status: AC

## 2020-03-30 MED ORDER — CARVEDILOL 12.5 MG PO TABS
12.5000 mg | ORAL_TABLET | Freq: Two times a day (BID) | ORAL | Status: DC
  Administered 2020-03-30: 12.5 mg via ORAL
  Filled 2020-03-30: qty 1

## 2020-03-30 MED ORDER — AMLODIPINE BESYLATE 10 MG PO TABS: 10.0000 mg | ORAL_TABLET | Freq: Every day | ORAL | 5 refills | Status: DC

## 2020-03-30 MED ORDER — ALBUTEROL SULFATE HFA 108 (90 BASE) MCG/ACT IN AERS: 2.0000 | INHALATION_SPRAY | Freq: Four times a day (QID) | RESPIRATORY_TRACT | 2 refills | Status: DC | PRN

## 2020-03-30 MED ORDER — ACETAMINOPHEN 325 MG PO TABS: 650.0000 mg | ORAL_TABLET | Freq: Four times a day (QID) | ORAL | 0 refills | Status: DC | PRN

## 2020-03-30 MED ORDER — CARVEDILOL 12.5 MG PO TABS: 12.5000 mg | ORAL_TABLET | Freq: Two times a day (BID) | ORAL | 5 refills | Status: DC

## 2020-03-30 MED ORDER — AZITHROMYCIN 500 MG PO TABS: 500.0000 mg | ORAL_TABLET | Freq: Every day | ORAL | 0 refills | Status: AC

## 2020-03-30 NOTE — Care Management Important Message (Signed)
Important Message  Patient Details  Name: Erin Jordan MRN: [id number] Date of Birth: 06/01/1945   Medicare Important Message Given:  Yes     Sherie Don, LCSW 03/30/2020, 10:14 AM

## 2020-03-30 NOTE — Telephone Encounter (Signed)
Attempt  to reach at [phone], got machine, left message to call back to discuss patient     I will fax note to [phone]

## 2020-03-30 NOTE — TOC Transition Note (Signed)
Transition of Care [facility]) - CM/SW Discharge Note   Patient Details  Name: Erin Jordan MRN: [id number] Date of Birth: 07/25/1945  Transition [facility] Care [facility]) CM/SW Contact:  Boneta Lucks, RN Phone Number: 03/30/2020, 1:11 PM   Clinical Narrative:   Patient discharging home today. Home Health orders are in, [place] with [facility] updated and added to AVS for HHPT.    Final next level of care: [place] Barriers to Discharge: Barriers Resolved   Patient Goals and CMS Choice Patient states their goals for this hospitalization and ongoing recovery are:: to return home. CMS Medicare.gov Compare Post Acute Care list provided to:: Patient Represenative (must comment) Choice offered to / list presented to : Adult Children  Discharge Placement    Patient and family notified of of transfer: 03/30/20  Discharge Plan and Services    HH Arranged: PT [facility] Agency: [place] Date [place]: 03/29/20 Time HH Agency Contacted: 1300 Representative spoke with at [place]: [place] Interventions     Readmission Risk Interventions Readmission Risk Prevention Plan 03/30/2020 03/29/2020  Transportation Screening Complete Complete  PCP or Specialist Appt within 5-7 Days - Not Complete  PCP or Specialist Appt within 3-5 Days Complete -  Home Care Screening - Complete  Medication Review (RN CM) - Complete  HRI or Home Care Consult Complete -  Social Work Consult for [place] Planning/Counseling Complete -  Palliative Care Screening Not Applicable -  Medication Review Press photographer) Complete -  Some recent data might be hidden

## 2020-03-30 NOTE — Telephone Encounter (Signed)
Pt was admitted to the hospital 12/21 with pneumonia.  [facility] Lab called today informing of a positive blood culture for staph.

## 2020-03-30 NOTE — Telephone Encounter (Signed)
Left message for pt to return call.

## 2020-03-30 NOTE — Discharge Instructions (Signed)
1)Please note that there has been several changes to your medications--- please take medications exactly as prescribed 2) please follow-up with primary care physician within a week for recheck and reevaluation 3) repeat CBC and BMP blood test with primary care physician within a week advised

## 2020-03-30 NOTE — Progress Notes (Signed)
MD made aware of + blood cultures aerobic gram positive cocci.

## 2020-03-30 NOTE — Telephone Encounter (Signed)
Can we please call patient and verify all of her medications. It looks like she was prescribed Augmentin. Please makes sure she completes this and has a hospital follow up.

## 2020-03-30 NOTE — Telephone Encounter (Signed)
The patient can restart her aspirin from my standpoint

## 2020-03-30 NOTE — Discharge Summary (Signed)
Erin Jordan, is a 74 y.o. female  DOB [birth year]  MRN [id number].  Admission date:  03/28/2020  Admitting Physician  Jonetta Osgood, MD  Discharge Date:  03/30/2020   Primary MD  [name], FNP  Recommendations for primary care physician for things to follow:   1)Please note that there has been several changes to your medications--- please take medications exactly as prescribed 2) please follow-up with primary care physician within a week for recheck and reevaluation 3) repeat CBC and BMP blood test with primary care physician within a week advised  Admission Diagnosis  Bronchopneumonia [J18.0] PNA (pneumonia) [J18.9] Fever, unspecified fever cause [R50.9]   Discharge Diagnosis  Bronchopneumonia [J18.0] PNA (pneumonia) [J18.9] Fever, unspecified fever cause [R50.9]    Principal Problem:   PNA (pneumonia) Active Problems:   CAD -S/P CABG in 2006 AND  PCI 06/26/17   Dementia without behavioral disturbance ([place])   CKD (chronic kidney disease), stage IV ([place])   Essential hypertension   Hyperthyroidism      Past Medical History:  Diagnosis Date  . Arthritis   . Cancer of kidney ([place])   . CKD (chronic kidney disease), stage IV ([place])   . Coronary atherosclerosis of native coronary artery 2006   Multivessel status post CABG in [place], graft disease documented March 2019 with DES to SVG to diagonal  . Essential hypertension   . 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 History of diabetes mellitus, type II   . Hyperlipidemia   . NSTEMI (non-ST elevated myocardial infarction) ([place]) 06/24/2017  . Renal hematoma   . Right renal mass     Past Surgical History:  Procedure Laterality Date  . ABDOMINAL HYSTERECTOMY    . CORONARY ARTERY BYPASS GRAFT  2006   [place], [place]  . CORONARY STENT INTERVENTION N/A 06/25/2017   Procedure: CORONARY STENT INTERVENTION;  Surgeon:  Jettie Booze, MD;  Location: [place] [facility];  Service: Cardiovascular;  Laterality: N/A;  . IR RADIOLOGIST EVAL & MGMT  07/08/2019  . LEFT HEART CATH AND CORS/GRAFTS ANGIOGRAPHY N/A 06/25/2017   Procedure: LEFT HEART CATH AND CORS/GRAFTS ANGIOGRAPHY;  Surgeon: Jettie Booze, MD;  Location: [place] [facility];  Service: Cardiovascular;  Laterality: N/A;       HPI  from the history and physical done on the day of admission:   Fever  HPI: Erin Jordan is a 74 y.o. female with medical history significant of CAD s/p CABG in 2006, stage IV CKD, DM-2, HLD, hypothyroidism, thyroid mass, right renal mass, dementia-who was brought to the ED by EMS for evaluation of fever and weakness.  Due to patient's current mental status-she is unable to provide history-this MD spoke with [name]-patient's granddaughter (first contact in face sheet)-and obtain further history.  Apparently-patient was in her usual state of health till yesterday-this morning family noted that the patient was very weak-much more confused than her usual baseline.  They noted fever as well.  She apparently had a similar presentation when  she had bleeding after renal biopsy earlier this year.  Family was worried-and subsequently EMS was called-and patient was brought to [facility] emergency room-where further evaluation revealed pneumonia.  Patient was noted to be coughing while this MD was interviewing her.  She appears to be pleasantly confused-but able to provide some history-although her thoughts were very tangential.  From what I can tell-she denied any nausea, vomiting or diarrhea.  Denies any abdominal pain.  ED Course:  Confused-imaging positive for pneumonia-given Rocephin/Zithromax-hospitalist service called to admit.      Hospital Course:    Brief Summary:- 74 y.o.femalewith medical history significant ofCAD s/p CABG in 2006, stage IV CKD, DM-2, HLD, hypothyroidism, thyroid mass,  right renal mass, dementia admitted on 03/28/20 for Pneumonia--  A/p 1)CAP--- cough and respiratory symptoms improved -No hypoxia -Rocephin azithromycin on admission -Also received Unasyn IV, -Being discharged on Augmentin on azithromycin --Continue bronchodilators -Patient is afebrile, leukocytosis noted, 1 out of 2 blood culture blood cultures with staph species suspect contaminant-   2)CKD IV--- -creatinine currently stable around 2 which is close to patient's baseline - renally adjust medications, avoid nephrotoxic agents / dehydration  / hypotension  3)Social/Ethics--- DNR, Discussed with grand-daughter [name] [phone], questions answered  4) advanced dementia behavioral disturbance--- -Patient has been pulling out IVs -Continue Aricept  5)H/o CAD/ s/p prior CABG /HTN-stable, no ACS type symptoms continue carvedilol, Lipitor and amlodipine  6)Hyperthyroidism: Continue methimazole, tachycardia noted, continue Coreg -TSH was 1.39 on 03/23/2020  7)Right renal mass/renal cell carcinoma:Being followed by nephrology-she had a biopsy done earlier this year-this was complicated by bleeding from the biopsy site. Biopsy was positive for renal cell carcinoma. - 8)Thyroid mass:Per family-she is scheduled for thyroid surgery next week.  9) generalized weakness and deconditioning--- physical therapist recommends home health  Disposition--- discharge home with family  Disposition: The patient is from: Home  Anticipated d/c is to: Home  \ Code Status :  -  Code Status: DNR   Family Communication:   Discussed with grand-daughter [name] [phone], questions answered  Consults  :  na Discharge Condition: Stable  Follow UP   Follow-up Information    [facility] [place] Follow up.   Specialty: Home Health Services Why:   PT Contact information: [address] [place] [address] [phone]         [name], FNP. Schedule an appointment as soon as possible for a visit in 1 week(s).   Specialty: Family Medicine Why: For repeat CBC and BMP blood test Contact information: [place] [address] [phone]        Satira Sark, MD .   Specialty: Cardiology Contact information: [place] [address] [phone]               Consults obtained -   Diet and Activity recommendation:  As advised  Discharge Instructions    Discharge Instructions    Call MD for:  difficulty breathing, headache or visual disturbances   Complete by: As directed    Call MD for:  persistant dizziness or light-headedness   Complete by: As directed    Call MD for:  persistant nausea and vomiting   Complete by: As directed    Call MD for:  severe uncontrolled pain   Complete by: As directed    Call MD for:  temperature >100.4   Complete by: As directed    Diet - low sodium heart healthy   Complete  by: As directed    Discharge instructions   Complete by: As directed    1)Please note that there has been several changes to your medications--- please take medications exactly as prescribed 2) please follow-up with primary care physician within a week for recheck and reevaluation 3) repeat CBC and BMP blood test with primary care physician within a week advised   Increase activity slowly   Complete by: As directed         Discharge Medications     Allergies as of 03/30/2020   No Known Allergies     Medication List    TAKE these medications   acetaminophen 325 MG tablet Commonly known as: TYLENOL Take 2 tablets (650 mg total) by mouth every 6 (six) hours as needed for mild pain (or Fever >/= 101).   albuterol 108 (90 Base) MCG/ACT inhaler Commonly known as: VENTOLIN HFA Inhale 2 puffs into the lungs every 6 (six) hours as needed for wheezing or shortness of breath.   amLODipine 10 MG tablet Commonly known as: NORVASC Take 1  tablet (10 mg total) by mouth daily.   amoxicillin-clavulanate 500-125 MG tablet Commonly known as: AUGMENTIN Take 1 tablet (500 mg total) by mouth 2 (two) times daily for 5 days.   atorvastatin 80 MG tablet Commonly known as: LIPITOR Take 1 tablet (80 mg total) by mouth daily.   azithromycin 500 MG tablet Commonly known as: ZITHROMAX Take 1 tablet (500 mg total) by mouth daily for 5 days.   carvedilol 12.5 MG tablet Commonly known as: COREG Take 1 tablet (12.5 mg total) by mouth 2 (two) times daily. For BP and Heart What changed:   medication strength  how much to take  additional instructions   cholecalciferol 25 MCG (1000 UNIT) tablet Commonly known as: VITAMIN D Take 1,000 Units by mouth daily.   donepezil 10 MG tablet Commonly known as: ARICEPT Take 1 tablet (10 mg total) by mouth at bedtime.   methimazole 5 MG tablet Commonly known as: TAPAZOLE Take 1 tablet (5 mg total) by mouth daily.   nitroGLYCERIN 0.4 MG SL tablet Commonly known as: NITROSTAT Place 1 tablet (0.4 mg total) under the tongue every 5 (five) minutes x 3 doses as needed for chest pain.   traZODone 50 MG tablet Commonly known as: DESYREL Take 1 tablet (50 mg total) by mouth at bedtime.       Major procedures and Radiology Reports - PLEASE review detailed and final reports for all details, in brief -    DG Chest Port 1 View  Result Date: 03/28/2020 CLINICAL DATA:  Fever EXAM: PORTABLE CHEST 1 VIEW COMPARISON:  10/14/2019 FINDINGS: Extensive artifact from EKG leads. Normal heart size and aortic contours. CABG. 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 Accentuated markings behind the heart. No edema, effusion, or pneumothorax. IMPRESSION: Accentuated markings behind the heart, equivocal for bronchopneumonia. Electronically Signed   By: Monte Fantasia M.D.   On: 03/28/2020 11:15    Micro Results  Recent Results (from the past 240 hour(s))  Resp Panel by RT-PCR (Flu  A&B, Covid) Nasopharyngeal Swab     Status: None   Collection Time: 03/28/20 10:18 AM   Specimen: Nasopharyngeal Swab; Nasopharyngeal(NP) swabs in vial transport medium  Result Value Ref Range Status   SARS Coronavirus 2 by RT PCR NEGATIVE NEGATIVE Final    Comment: (NOTE) SARS-CoV-2 target nucleic acids are NOT DETECTED.  The SARS-CoV-2 RNA is generally detectable in upper respiratory specimens  during the acute phase of infection. The lowest concentration of SARS-CoV-2 viral copies this assay can detect is 138 copies/mL. A negative result does not preclude SARS-Cov-2 infection and should not be used as the sole basis for treatment or other patient management decisions. A negative result may occur with  improper specimen collection/handling, submission of specimen other than nasopharyngeal swab, presence of viral mutation(s) within the areas targeted by this assay, and inadequate number of viral copies(<138 copies/mL). A negative result must be combined with clinical observations, patient history, and epidemiological information. The expected result is Negative.  Fact Sheet for Patients:  EntrepreneurPulse.com.au  Fact Sheet for Healthcare Providers:  IncredibleEmployment.be  This test is no t yet approved or cleared by the [place] FDA and  has been authorized for detection and/or diagnosis of SARS-CoV-2 by FDA under an Emergency Use Authorization (EUA). This EUA will remain  in effect (meaning this test can be used) for the duration of the COVID-19 declaration under Section 564(b)(1) of the Act, 21 U.S.C.section 360bbb-3(b)(1), unless the authorization is terminated  or revoked sooner.       Influenza A by PCR NEGATIVE NEGATIVE Final   Influenza B by PCR NEGATIVE NEGATIVE Final    Comment: (NOTE) The Xpert Xpress SARS-CoV-2/FLU/RSV plus assay is intended as an aid in the diagnosis of influenza from Nasopharyngeal swab specimens  and should not be used as a sole basis for treatment. Nasal washings and aspirates are unacceptable for Xpert Xpress SARS-CoV-2/FLU/RSV testing.  Fact Sheet for Patients: EntrepreneurPulse.com.au  Fact Sheet for Healthcare Providers: IncredibleEmployment.be  This test is not yet approved or cleared by the [place] FDA and has been authorized for detection and/or diagnosis of SARS-CoV-2 by FDA under an Emergency Use Authorization (EUA). This EUA will remain in effect (meaning this test can be used) for the duration of the COVID-19 declaration under Section 564(b)(1) of the Act, 21 U.S.C. section 360bbb-3(b)(1), unless the authorization is terminated or revoked.  Performed at [facility], [address], [place], [place] [address]   Blood culture (routine x 2)     Status: None (Preliminary result)   Collection Time: 03/28/20 11:15 AM   Specimen: BLOOD  Result Value Ref Range Status   Specimen Description BLOOD BLOOD LEFT FOREARM  Final   Special Requests   Final    BOTTLES DRAWN AEROBIC AND ANAEROBIC Blood Culture results may not be optimal due to an inadequate volume of blood received in culture bottles   Culture   Final    NO GROWTH 2 DAYS Performed at [facility], [address] [place], [place] [address]    Report Status PENDING  Incomplete  Blood culture (routine x 2)     Status: None (Preliminary result)   Collection Time: 03/28/20 12:00 PM   Specimen: BLOOD  Result Value Ref Range Status   Specimen Description BLOOD RIGHT ANTECUBITAL  Final   Special Requests   Final    BOTTLES DRAWN AEROBIC AND ANAEROBIC Blood Culture results may not be optimal due to an inadequate volume of blood received in culture bottles   Culture  Setup Time   Final    GRAM POSITIVE COCCI AEROBIC BOTTLE Gram Stain Report Called to,Read Back By and Verified With: @JONES , D @ 0700 BY MATTEHWS, B 12.23.21  Performed at [facility], [address],  [place], [place] [address]    Culture PENDING  Incomplete   Report Status PENDING  Incomplete    Today   Subjective  Erin Jordan today has no new complaints No fever  Or chills   No Nausea, Vomiting or Diarrhea      Patient has been seen and examined prior to discharge   Objective   Blood pressure (!) 161/86, pulse 97, temperature 98.4 F (36.9 C), temperature source Oral, resp. rate 20, height 5\' 2"  (1.575 m), weight 50.3 kg, SpO2 100 %.   Intake/Output Summary (Last 24 hours) at 03/30/2020 1245 Last data filed at 03/29/2020 1900 Gross per 24 hour  Intake 800 ml  Output --  Net 800 ml    Exam Gen:- Awake Alert,  in no acute distress, no conversational dyspnea, no hypoxia HEENT:- [place].AT, No sclera icterus Neck-Supple Neck,No JVD,.  Lungs-improved air movement, no wheezing, no rales CV- S1, S2 normal, regular , prior sternotomy scar Abd-  +ve B.Sounds, Abd Soft, No tenderness,    Extremity/Skin:- No  edema, pedal pulses present  Psych-Baseline cognitive and memory deficits, confusion and disorientation persist  neuro-generalized weakness, no new focal deficits, no tremors   Data Review   CBC w Diff:  Lab Results  Component Value Date   WBC 13.3 (H) 03/30/2020   HGB 14.4 03/30/2020   HGB 12.4 01/21/2020   HCT 44.4 03/30/2020   HCT 37.7 01/21/2020   PLT 231 03/30/2020   PLT 392 01/21/2020   LYMPHOPCT 3 03/28/2020   MONOPCT 6 03/28/2020   EOSPCT 0 03/28/2020   BASOPCT 0 03/28/2020    CMP:  Lab Results  Component Value Date   NA 142 03/30/2020   NA 142 01/21/2020   K 3.4 (L) 03/30/2020   CL 105 03/30/2020   CO2 24 03/30/2020   BUN 33 (H) 03/30/2020   BUN 46 (H) 01/21/2020   CREATININE 2.13 (H) 03/30/2020   PROT 6.3 (L) 03/29/2020   PROT 6.5 01/21/2020   ALBUMIN 3.5 03/29/2020   ALBUMIN 4.2 01/21/2020   BILITOT 0.6 03/29/2020   BILITOT 0.3 01/21/2020   ALKPHOS 79 03/29/2020   AST 16 03/29/2020   ALT 11 03/29/2020  .   Total Discharge time is  about 33 minutes  Roxan Hockey M.D on 03/30/2020 at 12:45 PM  Go to [email or web address] -  for contact info  [facility] - Office  [phone]

## 2020-03-30 NOTE — Evaluation (Signed)
Occupational Therapy Evaluation Patient Details Name: Erin Jordan MRN: [id number] DOB: Feb 15, 1946 Today's Date: 03/30/2020    History of Present Illness JACCI RUBERG is a 74 y.o. female with medical history significant of CAD s/p CABG in 2006, stage IV CKD, DM-2, HLD, hypothyroidism, thyroid mass, right renal mass, dementia-who was brought to the ED by EMS for evaluation of fever and weakness.  Due to patient's current mental status-she is unable to provide history-this MD spoke with [place] Colla-patient's granddaughter (first contact in face sheet)-and obtain further history.   Clinical Impression   Pt agreeable to OT evaluation this am. Pt performing ADLs sitting at EOB and then standing at sink. Pt with no difficulty completing tasks, occasional cuing for locating items in unfamiliar environment. Pt performing all tasks with supervision. Pt is at baseline with ADLs, family is available to assist as needed at home. No further OT services required at this time.     Follow Up Recommendations  No OT follow up;Supervision/Assistance - 24 hour    Equipment Recommendations  None recommended by OT       Precautions / Restrictions Precautions Precautions: Fall Restrictions Weight Bearing Restrictions: No      Mobility Bed Mobility Overal bed mobility: Modified Independent                  Transfers Overall transfer level: Needs assistance Equipment used: None Transfers: Sit to/from [employer] Transfers Sit to Stand: Supervision Stand pivot transfers: Supervision                ADL either performed or assessed with clinical judgement   ADL Overall ADL's : Needs assistance/impaired     Grooming: Wash/dry hands;Oral care;Modified independent;Standing Grooming Details (indicate cue type and reason): Pt performing tasks standing at sink for >5 minutes. No LOB         Upper Body Dressing : Modified independent;Sitting   Lower Body Dressing:  Supervision/safety;Sitting/lateral leans Lower Body Dressing Details (indicate cue type and reason): pt adjusting socks while seated at EOB Toilet Transfer: Supervision/safety;Ambulation;Regular Toilet   Toileting- Water quality scientist and Hygiene: Supervision/safety;Sitting/lateral lean;Sit to/from stand       Functional mobility during ADLs: Supervision/safety       Vision Baseline Vision/History: No visual deficits Patient Visual Report: No change from baseline Vision Assessment?: No apparent visual deficits            Pertinent Vitals/Pain Pain Assessment: No/denies pain     Hand Dominance Right   Extremity/Trunk Assessment Upper Extremity Assessment Upper Extremity Assessment: Overall WFL for tasks assessed   Lower Extremity Assessment Lower Extremity Assessment: Defer to PT evaluation   Cervical / Trunk Assessment Cervical / Trunk Assessment: Normal   Communication Communication Communication: No difficulties   Cognition Arousal/Alertness: Awake/alert Behavior During Therapy: WFL for tasks assessed/performed Overall Cognitive Status: History of cognitive impairments - at baseline                                                Home Living Family/patient expects to be discharged to:: Private residence Living Arrangements: Children Available Help at Discharge: Family;Available PRN/intermittently Type of Home: Mobile home Home Access: Stairs to enter Entrance Stairs-Number of Steps: 5 Entrance Stairs-Rails: Right;Left;Can reach both Home Layout: One level     Bathroom Shower/Tub: Walk-in shower         Home Equipment:  Walker - 2 wheels;Cane - single point;Shower seat;Bedside commode;Wheelchair - manual          Prior Functioning/Environment Level of Independence: Needs assistance  Gait / Transfers Assistance Needed: no DME for mobility ADL's / Homemaking Assistance Needed: Family available for supervision and to assist with  ADLs as needed            OT Problem List: Decreased activity tolerance;Decreased cognition;Impaired balance (sitting and/or standing)          End of Session    Activity Tolerance: Patient tolerated treatment well Patient left: in chair;with call bell/phone within reach;with chair alarm set  OT Visit Diagnosis: Muscle weakness (generalized) (M62.81)                Time: 3295-1884 OT Time Calculation (min): 18 min Charges:  OT General Charges $OT Visit: 1 Visit OT Evaluation $OT Eval Low Complexity: [place], OTR/L  [phone] 03/30/2020, 7:57 AM

## 2020-03-30 NOTE — Telephone Encounter (Signed)
Hi team, I sent the following message to urology in Epic but did not see a reply yet - can you please call Dr. Saralyn Pilar McKenzie's office with urology to relay that message? Per Epic he is with [facility] [place]. Thank you so much! Shakima Nisley

## 2020-04-01 LAB — CULTURE, BLOOD (ROUTINE X 2)

## 2020-04-02 LAB — CULTURE, BLOOD (ROUTINE X 2): Culture: NO GROWTH

## 2020-04-03 ENCOUNTER — Other Ambulatory Visit (HOSPITAL_COMMUNITY): Payer: Medicare HMO

## 2020-04-03 NOTE — Telephone Encounter (Signed)
As below, from cardiac standpoint and urology standpoint, patient can resume baby aspirin 81mg  daily. (She has been on this for cardiac status but several months ago it was stopped around the time of her kidney surgery and never restarted).  She was actually pending upcoming thyroid surgery so our original plan was just to recommend the surgical team restart the aspirin post-surgery when felt safe from their perspective. However, it looks like that surgery is now cancelled in Epic (was hospitalized with PNA in the interim). Can you please contact team doing her thyroid surgery to get their input on when they think surgery will be rescheduled to? If it's postponed, she should be back on aspirin until they need her to stop again (and restart post-surgery when felt safe by performing surgeon). Please communicate plan to pt/granddaughter. Thank you, Daphane Odekirk

## 2020-04-04 ENCOUNTER — Telehealth: Payer: Self-pay | Admitting: Urology

## 2020-04-04 NOTE — Telephone Encounter (Signed)
Thank you for the update. Please let pt/granddaughter know to restart baby aspirin 81mg  and add to [facility].  Please find out from surgical team if they will need her to be off her baby aspirin for her thyroid surgery or if they are OK with her being on this for the surgery. If they require clearance for stopping, will plan to loop Dr. Domenic Polite in.

## 2020-04-04 NOTE — Telephone Encounter (Signed)
Spoke with [facility], pt has an appt on Feb 3 with Dr. Harlow Asa to reschedule thyroid surgery.

## 2020-04-04 NOTE — Telephone Encounter (Signed)
Created in error

## 2020-04-04 NOTE — Telephone Encounter (Signed)
Called to notify pt of ASA. Spoke with granddaughter who states that pt is currently admitted in [place].

## 2020-04-04 NOTE — Telephone Encounter (Addendum)
OK. Would recommend post-hospital visit thereafter when discharged then to readdress clearance since a lot of new developments since then. Let surgery team know as well. Thanks.

## 2020-04-05 ENCOUNTER — Ambulatory Visit (HOSPITAL_COMMUNITY): Admission: RE | Admit: 2020-04-05 | Payer: Medicare HMO | Source: Home / Self Care | Admitting: Surgery

## 2020-04-05 ENCOUNTER — Encounter (HOSPITAL_COMMUNITY): Admission: RE | Payer: Self-pay | Source: Home / Self Care

## 2020-04-05 SURGERY — THYROIDECTOMY
Anesthesia: General

## 2020-04-06 ENCOUNTER — Other Ambulatory Visit: Payer: Self-pay | Admitting: Family Medicine

## 2020-04-06 DIAGNOSIS — I10 Essential (primary) hypertension: Secondary | ICD-10-CM

## 2020-04-13 NOTE — Progress Notes (Signed)
TSH was ordered at most recent office visit to exclude the presence of hyperthyroidism contributing to persistently uncontrolled hypertension. Matias Thurman PA-C

## 2020-04-14 NOTE — Telephone Encounter (Signed)
Attempted to contact pt without return call in over 3 days, will close encounter. 

## 2020-04-19 ENCOUNTER — Other Ambulatory Visit: Payer: Self-pay

## 2020-04-19 ENCOUNTER — Encounter: Payer: Self-pay | Admitting: Family Medicine

## 2020-04-19 ENCOUNTER — Ambulatory Visit (INDEPENDENT_AMBULATORY_CARE_PROVIDER_SITE_OTHER): Payer: Medicare HMO | Admitting: Family Medicine

## 2020-04-19 VITALS — BP 159/74 | HR 80 | Temp 97.7°F | Resp 20 | Ht 62.0 in | Wt 111.0 lb

## 2020-04-19 DIAGNOSIS — C641 Malignant neoplasm of right kidney, except renal pelvis: Secondary | ICD-10-CM

## 2020-04-19 DIAGNOSIS — Z8701 Personal history of pneumonia (recurrent): Secondary | ICD-10-CM

## 2020-04-19 DIAGNOSIS — Z09 Encounter for follow-up examination after completed treatment for conditions other than malignant neoplasm: Secondary | ICD-10-CM | POA: Diagnosis not present

## 2020-04-19 DIAGNOSIS — Z8719 Personal history of other diseases of the digestive system: Secondary | ICD-10-CM | POA: Diagnosis not present

## 2020-04-19 DIAGNOSIS — N184 Chronic kidney disease, stage 4 (severe): Secondary | ICD-10-CM

## 2020-04-19 DIAGNOSIS — E042 Nontoxic multinodular goiter: Secondary | ICD-10-CM

## 2020-04-19 NOTE — Progress Notes (Signed)
Assessment & Plan:  1. Hospital discharge follow-up - Doing well. Working with PT at home.  - CBC with Differential/Platelet - BMP8+EGFR  2. History of pneumonia - Resolved. - CBC with Differential/Platelet - BMP8+EGFR  3. History of small bowel obstruction - Resolved. - CBC with Differential/Platelet - BMP8+EGFR  4. CKD (chronic kidney disease), stage IV (HCC) - BMP8+EGFR  5. Multinodular goiter - Reschedule thyroidectomy with Dr. Harlow Asa.  6. Renal cell carcinoma of right kidney [facility]) - Keep follow-up with urology.    Follow up plan: Return in about 3 months (around 07/18/2020) for follow-up of chronic medication conditions.  Hendricks Limes, MSN, APRN, FNP-C [facility]  Subjective:   Patient ID: Erin Jordan, female    DOB: 07-Apr-1946, 75 y.o.   MRN: [id number]  HPI: Erin Jordan is a 75 y.o. female presenting on 04/19/2020 for Hospitalization Follow-up (AP + discharge 12/23- bronchopneumonia )  Patient is accompanied by her granddaughter who she is okay with being present.  Patient was admitted to [facility] from 03/28/2020 - 03/30/2020 due to pneumonia.  Patient was treated with antibiotics and bronchodilators.  Her blood cultures did grow staph species that was suspected to be a contaminant.  She was discharged home with orders for home health physical therapy.  The physical therapist did come out on Monday of this week and is scheduled to come out again on Friday this is through [place].  Patient was then admitted to [facility] from 04/02/2020 - 04/10/2020 due to a small bowel obstruction.  Patient was managed conservatively.  She improved and was tolerating a diet without further issues.  Patient reports today she is doing well.  Denies any shortness of breath or abdominal pain.  She is tolerating diet as usual.  Patient has to reschedule her thyroidectomy as she was in the hospital when this was scheduled to occur.  She  has not yet been able to get the follow-up CT scan the urologist wants due to insurance.   ROS: Negative unless specifically indicated above in HPI.   Relevant past medical history reviewed and updated as indicated.   Allergies and medications reviewed and updated.   Current Outpatient Medications:  .  acetaminophen (TYLENOL) 325 MG tablet, Take 2 tablets (650 mg total) by mouth every 6 (six) hours as needed for mild pain (or Fever >/= 101)., Disp: 20 tablet, Rfl: 0 .  albuterol (VENTOLIN HFA) 108 (90 Base) MCG/ACT inhaler, Inhale 2 puffs into the lungs every 6 (six) hours as needed for wheezing or shortness of breath., Disp: 18 g, Rfl: 2 .  amLODipine (NORVASC) 10 MG tablet, Take 1 tablet (10 mg total) by mouth daily., Disp: 30 tablet, Rfl: 5 .  atorvastatin (LIPITOR) 80 MG tablet, Take 1 tablet (80 mg total) by mouth daily., Disp: 90 tablet, Rfl: 1 .  carvedilol (COREG) 12.5 MG tablet, Take 1 tablet (12.5 mg total) by mouth 2 (two) times daily. For BP and Heart, Disp: 60 tablet, Rfl: 5 .  cholecalciferol (VITAMIN D) 25 MCG (1000 UT) tablet, Take 1,000 Units by mouth daily. , Disp: , Rfl:  .  donepezil (ARICEPT) 10 MG tablet, Take 1 tablet (10 mg total) by mouth at bedtime., Disp: 90 tablet, Rfl: 1 .  methimazole (TAPAZOLE) 5 MG tablet, Take 1 tablet (5 mg total) by mouth daily., Disp: 30 tablet, Rfl: 5 .  traZODone (DESYREL) 50 MG tablet, Take 1 tablet (50 mg total) by mouth at bedtime., Disp: 30 tablet,  Rfl: 5 .  nitroGLYCERIN (NITROSTAT) 0.4 MG SL tablet, Place 1 tablet (0.4 mg total) under the tongue every 5 (five) minutes x 3 doses as needed for chest pain. (Patient not taking: Reported on 04/19/2020), Disp: 25 tablet, Rfl: 3  No Known Allergies  Objective:   BP (!) 159/74   Pulse 80   Temp 97.7 F (36.5 C)   Resp 20   Ht _0  (1.575 m)   Wt 111 lb (50.3 kg)   SpO2 100%   BMI 20.30 kg/m    Physical Exam Vitals reviewed.  Constitutional:      General: She is not in acute  distress.    Appearance: Normal appearance. She is normal weight. She is not ill-appearing, toxic-appearing or diaphoretic.  HENT:     Head: Normocephalic and atraumatic.  Eyes:     General: No scleral icterus.       Right eye: No discharge.        Left eye: No discharge.     Conjunctiva/sclera: Conjunctivae normal.  Cardiovascular:     Rate and Rhythm: Normal rate and regular rhythm.     Heart sounds: Normal heart sounds. No murmur heard. No friction rub. No gallop.   Pulmonary:     Effort: Pulmonary effort is normal. No respiratory distress.     Breath sounds: Normal breath sounds. No stridor. No wheezing, rhonchi or rales.  Abdominal:     General: Bowel sounds are normal. There is no distension.     Palpations: Abdomen is soft. There is no mass.     Tenderness: There is no abdominal tenderness. There is no guarding or rebound.     Hernia: No hernia is present.  Musculoskeletal:        General: Normal range of motion.     Cervical back: Normal range of motion.  Skin:    General: Skin is warm and dry.     Capillary Refill: Capillary refill takes less than 2 seconds.  Neurological:     General: No focal deficit present.     Mental Status: She is alert and oriented to person, place, and time. Mental status is at baseline.  Psychiatric:        Mood and Affect: Mood normal.        Behavior: Behavior normal.        Thought Content: Thought content normal.        Judgment: Judgment normal.

## 2020-04-20 LAB — BMP8+EGFR
BUN/Creatinine Ratio: 18 (ref 12–28)
BUN: 52 mg/dL — ABNORMAL HIGH (ref 8–27)
CO2: 24 mmol/L (ref 20–29)
Calcium: 9.4 mg/dL (ref 8.7–10.3)
Chloride: 105 mmol/L (ref 96–106)
Creatinine, Ser: 2.85 mg/dL — ABNORMAL HIGH (ref 0.57–1.00)
GFR calc Af Amer: 18 mL/min/{1.73_m2} — ABNORMAL LOW (ref >59)
GFR calc non Af Amer: 15 mL/min/{1.73_m2} — ABNORMAL LOW (ref >59)
Glucose: 170 mg/dL — ABNORMAL HIGH (ref 65–99)
Potassium: 4.3 mmol/L (ref 3.5–5.2)
Sodium: 144 mmol/L (ref 134–144)

## 2020-04-20 LAB — CBC WITH DIFFERENTIAL/PLATELET
Basophils Absolute: 0.1 10*3/uL (ref 0.0–0.2)
Basos: 1 %
EOS (ABSOLUTE): 0.2 10*3/uL (ref 0.0–0.4)
Eos: 3 %
Hematocrit: 34.4 % (ref 34.0–46.6)
Hemoglobin: 11.5 g/dL (ref 11.1–15.9)
Immature Grans (Abs): 0 10*3/uL (ref 0.0–0.1)
Immature Granulocytes: 0 %
Lymphocytes Absolute: 1.1 10*3/uL (ref 0.7–3.1)
Lymphs: 21 %
MCH: 31 pg (ref 26.6–33.0)
MCHC: 33.4 g/dL (ref 31.5–35.7)
MCV: 93 fL (ref 79–97)
Monocytes Absolute: 0.5 10*3/uL (ref 0.1–0.9)
Monocytes: 9 %
Neutrophils Absolute: 3.4 10*3/uL (ref 1.4–7.0)
Neutrophils: 66 %
Platelets: 268 10*3/uL (ref 150–450)
RBC: 3.71 x10E6/uL — ABNORMAL LOW (ref 3.77–5.28)
RDW: 12.6 % (ref 11.7–15.4)
WBC: 5.2 10*3/uL (ref 3.4–10.8)

## 2020-04-27 ENCOUNTER — Telehealth: Payer: Self-pay | Admitting: Urology

## 2020-04-27 NOTE — Telephone Encounter (Signed)
Background note: I along with Dr Alyson Ingles have been trying to get approval for a CT Renal Stone Study since late December 2021. The first one was denied in December and the peer to peer window expired before Dr. Alyson Ingles was back in the office. The second request was denied on 04/18/2020. On Friday, April 21, 2020 I waited on hold with [employer] for 45 minutes to let Dr Alyson Ingles do the peer to peer but he had a surgery scheduled and had to leave. On 04/27/2020 I called [employer] and [employer], I spoke with at least 9 different people and was transferred everytime to another extension. I finally reached a nurse that told me we could appeal the decision which may take up to 30 days or we can try a different code. She gave me [phone] CT w and w/o to try for renal cell carcinoma initial staging. I sent a message to Dr Alyson Ingles today to see if that CT would work for this patient. I also have communicated with patients granddaughter [name] several time during this process. I also called her today and informed her of the current situation. She stated she wasn't sure if the patient thyroid surgery needs to be done before the CT, she said she would get back to me after she talks to Ms. Johnsons PCP and let me know.

## 2020-04-28 ENCOUNTER — Telehealth: Payer: Self-pay | Admitting: Urology

## 2020-04-28 NOTE — Telephone Encounter (Signed)
I spoke with patients insurance yesterday. They stated the CT renal stone study you ordered was denied and would need to be appealed. She gave me that info. She also stated that if you order [id number] which is CT abd/pelvis with and w/o it would be approved.

## 2020-05-02 ENCOUNTER — Ambulatory Visit: Payer: Medicare HMO

## 2020-05-02 ENCOUNTER — Other Ambulatory Visit: Payer: Self-pay

## 2020-05-03 ENCOUNTER — Telehealth: Payer: Self-pay

## 2020-05-04 NOTE — Telephone Encounter (Signed)
Pt is returning nurse call.

## 2020-05-04 NOTE — Telephone Encounter (Signed)
Aware and verbalizes understanding.  

## 2020-05-04 NOTE — Telephone Encounter (Signed)
lmtcb

## 2020-05-04 NOTE — Telephone Encounter (Signed)
I would definitely proceed with the CT with Dr. Alyson Ingles.

## 2020-05-10 ENCOUNTER — Other Ambulatory Visit: Payer: Self-pay | Admitting: Urology

## 2020-05-10 DIAGNOSIS — N2889 Other specified disorders of kidney and ureter: Secondary | ICD-10-CM

## 2020-05-18 ENCOUNTER — Ambulatory Visit (HOSPITAL_COMMUNITY)
Admission: RE | Admit: 2020-05-18 | Discharge: 2020-05-18 | Disposition: A | Discharge status: Home / Self Care | Payer: Medicare HMO | Source: Ambulatory Visit | Attending: Urology | Admitting: Urology

## 2020-05-18 ENCOUNTER — Other Ambulatory Visit: Payer: Self-pay

## 2020-05-18 ENCOUNTER — Telehealth: Payer: Self-pay

## 2020-05-18 DIAGNOSIS — N2889 Other specified disorders of kidney and ureter: Secondary | ICD-10-CM | POA: Insufficient documentation

## 2020-05-18 IMAGING — CT CT ABD-PELV W/O CM
2 of 4 series · 16 of 46 positions shown, 18 images · non-contrast
Comparison: CT abdomen pelvis, [DATE], [DATE], MR abdomen,
[DATE]

CLINICAL DATA: Follow-up right renal mass, biopsy complicated by
hemorrhage

EXAM:
CT ABDOMEN AND PELVIS WITHOUT CONTRAST
TECHNIQUE: Multidetector CT imaging of the abdomen and pelvis was performed
following the standard protocol without IV contrast. Oral enteric
contrast was administered.

[Series 2: axial st · axial · 0.71mm/px · z∈[+448,+803]mm · 13 of 81 slices shown, 15 images]
[im 5/81  soft-tissue]
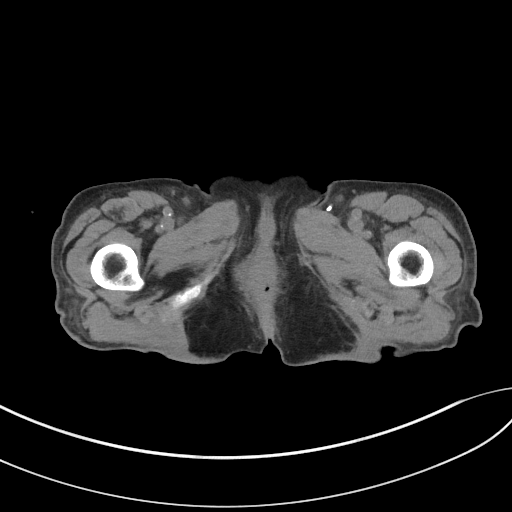
[im 5/81  bone]
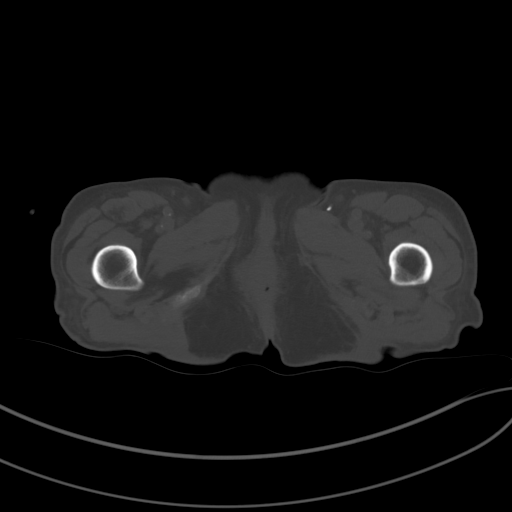
[im 9/81  soft-tissue]
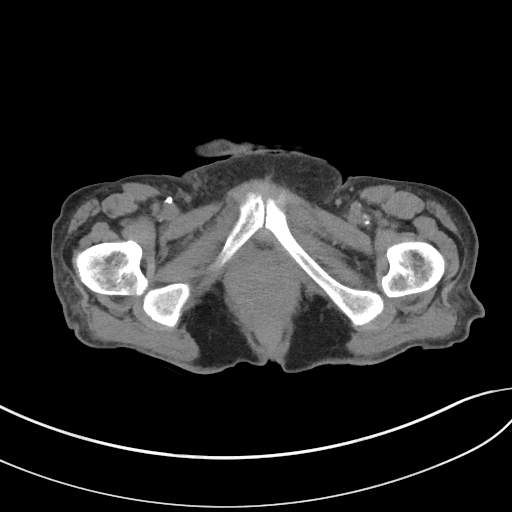
[im 18/81  soft-tissue]
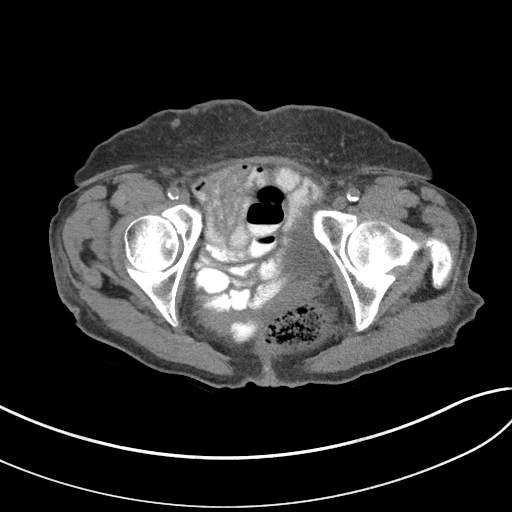
[im 23/81  soft-tissue]
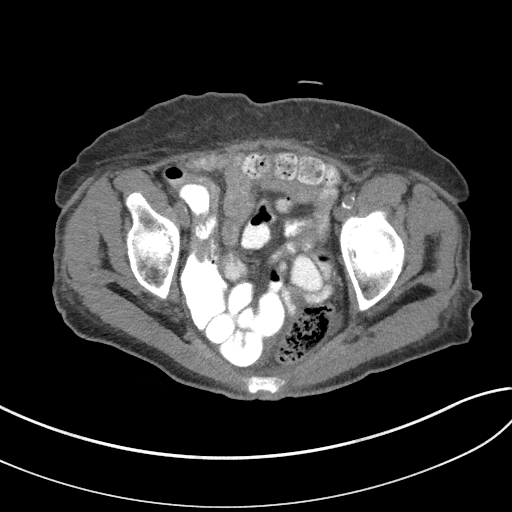
[im 27/81  soft-tissue]
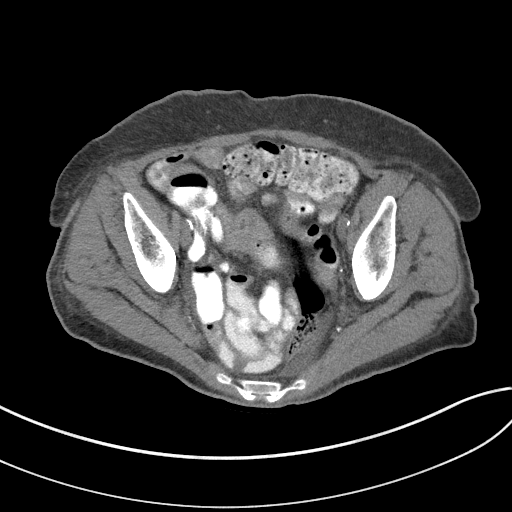
[im 36/81  soft-tissue]
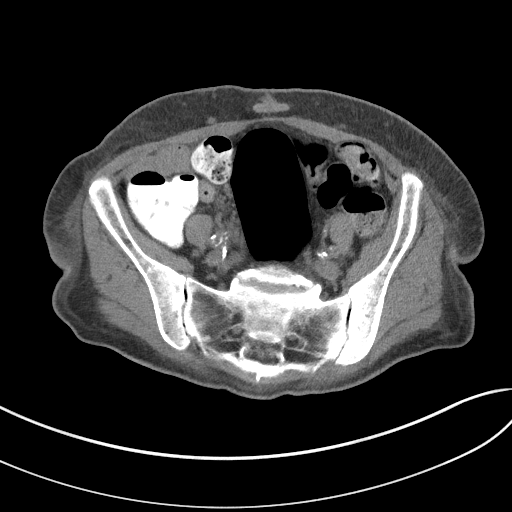
[im 41/81  soft-tissue]
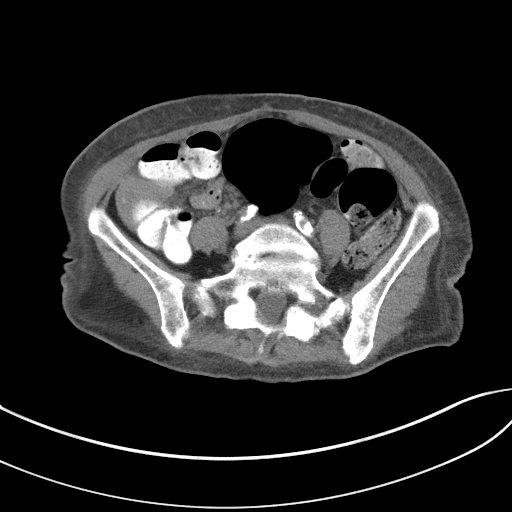
[im 45/81  soft-tissue]
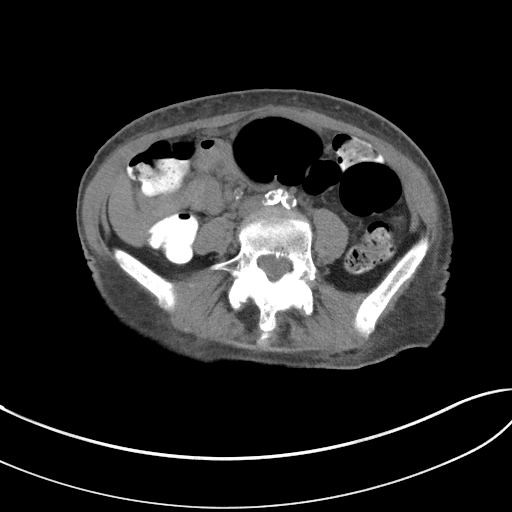
[im 54/81  soft-tissue]
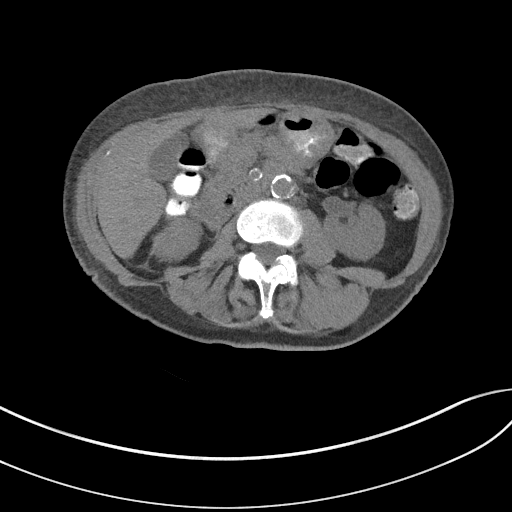
[im 54/81  bone]
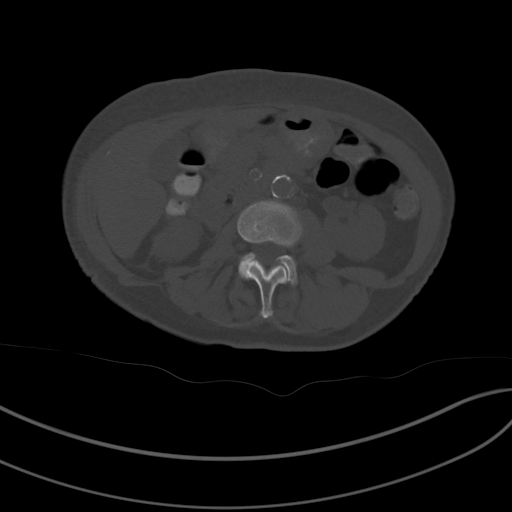
[im 58/81  soft-tissue]
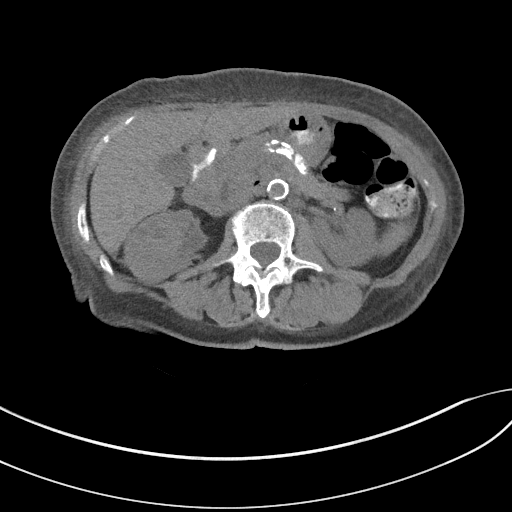
[im 63/81  soft-tissue]
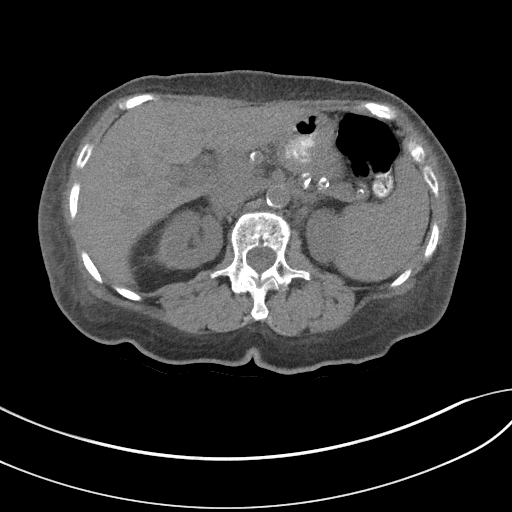
[im 72/81  soft-tissue]
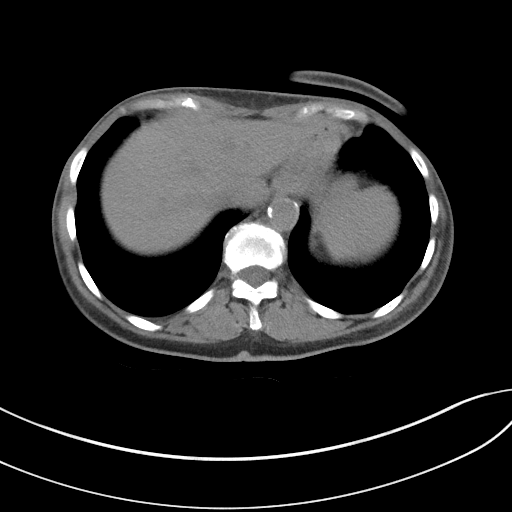
[im 76/81  soft-tissue]
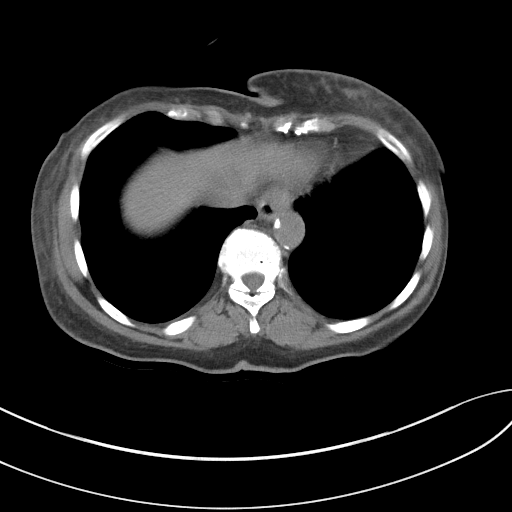

[Series 5: coronal st · coronal · 0.69mm/px · 3 of 85 slices shown]
[im 29/85  soft-tissue]
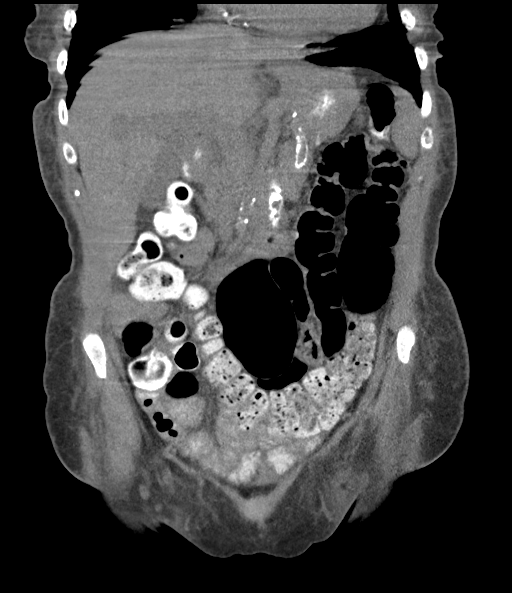
[im 38/85  soft-tissue]
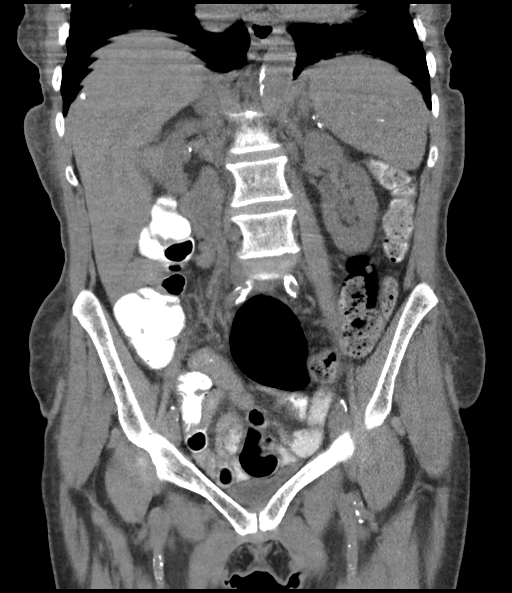
[im 47/85  soft-tissue]
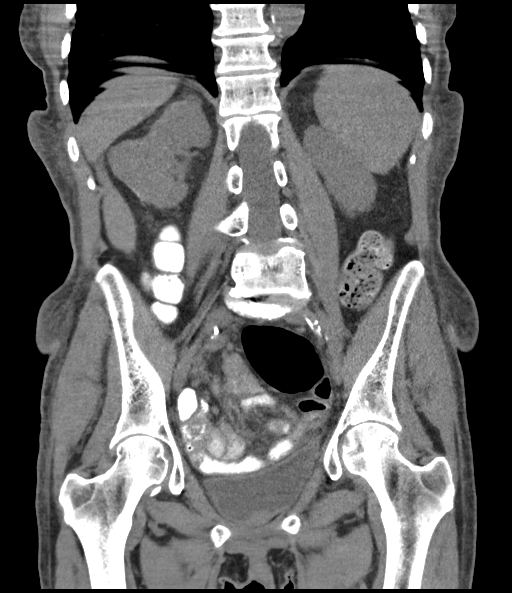

[16 of 46 positions shown; findings below may reference images not displayed]

FINDINGS: Lower chest: No acute abnormality.

Hepatobiliary: No solid liver abnormality is seen. No gallstones,
gallbladder wall thickening, or biliary dilatation.

Pancreas: Unremarkable. No pancreatic ductal dilatation or
surrounding inflammatory changes.

Spleen: Normal in size without significant abnormality.

Adrenals/Urinary Tract: Adrenal glands are unremarkable. No evident
interval change in noncontrast appearance of a slightly hypodense,
partially exophytic mass of the lateral midportion of the right
kidney measuring approximately 4.4 x 4.1 cm. This does appear to
have substantially enlarged in comparison to more remote prior
examinations, particularly MR dated [DATE]. The left kidney is
normal in noncontrast appearance. No hydronephrosis. Bladder is
unremarkable.

Stomach/Bowel: Stomach is within normal limits. Appendix appears
normal. No evidence of bowel wall thickening, distention, or
inflammatory changes.

Vascular/Lymphatic: Aortic atherosclerosis. No enlarged abdominal or
pelvic lymph nodes.

Reproductive: Status post hysterectomy.

Other: No abdominal wall hernia or abnormality. No abdominopelvic
ascites.

Musculoskeletal: No acute or significant osseous findings.
IMPRESSION: No evident interval change in noncontrast appearance of a slightly
hypodense, partially exophytic mass of the lateral midportion of the
right kidney measuring approximately 4.4 x 4.1 cm; characterization
very limited by noncontrast CT. This does however appear to have
substantially enlarged in comparison to more remote prior
examinations, particularly MR dated [DATE]. Findings remain
highly concerning for renal cell carcinoma. Contrast enhanced CT or
MRI, which are not contraindicated by stable chronic renal disease,
are strongly preferred for the characterization and follow-up of
renal lesions.

Aortic Atherosclerosis ([YG]-[YG]).

## 2020-05-18 MED ORDER — IOHEXOL 9 MG/ML PO SOLN
ORAL | Status: AC
  Filled 2020-05-18: qty 500

## 2020-05-18 MED ORDER — BARIUM SULFATE 2.1 % PO SUSP
ORAL | Status: AC
  Filled 2020-05-18: qty 2

## 2020-05-23 NOTE — Telephone Encounter (Signed)
Old ppw printed and placed on providers desk to be updated

## 2020-05-31 NOTE — Telephone Encounter (Signed)
Fax finally went through on 05/30/20

## 2020-06-06 ENCOUNTER — Telehealth: Payer: Self-pay

## 2020-06-07 ENCOUNTER — Ambulatory Visit: Payer: Self-pay | Admitting: Surgery

## 2020-06-21 ENCOUNTER — Other Ambulatory Visit: Payer: Self-pay

## 2020-06-21 ENCOUNTER — Ambulatory Visit (INDEPENDENT_AMBULATORY_CARE_PROVIDER_SITE_OTHER): Payer: Medicare HMO | Admitting: Urology

## 2020-06-21 ENCOUNTER — Encounter: Payer: Self-pay | Admitting: Urology

## 2020-06-21 VITALS — BP 119/54 | HR 67 | Temp 98.5°F | Ht 62.0 in | Wt 102.0 lb

## 2020-06-21 DIAGNOSIS — N2889 Other specified disorders of kidney and ureter: Secondary | ICD-10-CM

## 2020-06-21 LAB — URINALYSIS, ROUTINE W REFLEX MICROSCOPIC
Bilirubin, UA: NEGATIVE
Glucose, UA: NEGATIVE
Leukocytes,UA: NEGATIVE
Nitrite, UA: NEGATIVE
Protein,UA: NEGATIVE
RBC, UA: NEGATIVE
Specific Gravity, UA: 1.01 (ref 1.005–1.030)
Urobilinogen, Ur: 0.2 mg/dL (ref 0.2–1.0)
pH, UA: 6.5 (ref 5.0–7.5)

## 2020-06-21 NOTE — Progress Notes (Signed)
06/21/2020 2:26 PM   Erin Jordan 11-05-1945 [id number]  Referring provider: [name], [address] [place],  [place] [address]  followup right renal mass  HPI: Erin Jordan is a 62PP here for followup for her right renal mass. She underwetn CT 05/18/2020 which showed a stable 4.4 right mesophytic renal mass which is stable in size. She denies any flank pain. She denies any hematuria. She denies any LUTS.     PMH: Past Medical History:  Diagnosis Date  . Arthritis   . Cancer of kidney ([place])   . CKD (chronic kidney disease), stage IV ([place])   . Coronary atherosclerosis of native coronary artery 2006   Multivessel status post CABG in [place], graft disease documented March 2019 with DES to SVG to diagonal  . Essential hypertension   . 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 History of diabetes mellitus, type II   . Hyperlipidemia   . NSTEMI (non-ST elevated myocardial infarction) ([place]) 06/24/2017  . Renal hematoma   . Right renal mass     Surgical History: Past Surgical History:  Procedure Laterality Date  . ABDOMINAL HYSTERECTOMY    . CORONARY ARTERY BYPASS GRAFT  2006   [place], [place]  . CORONARY STENT INTERVENTION N/A 06/25/2017   Procedure: CORONARY STENT INTERVENTION;  Surgeon: Jettie Booze, MD;  Location: [facility];  Service: Cardiovascular;  Laterality: N/A;  . IR RADIOLOGIST EVAL & MGMT  07/08/2019  . LEFT HEART CATH AND CORS/GRAFTS ANGIOGRAPHY N/A 06/25/2017   Procedure: LEFT HEART CATH AND CORS/GRAFTS ANGIOGRAPHY;  Surgeon: Jettie Booze, MD;  Location: [facility] [place] [facility];  Service: Cardiovascular;  Laterality: N/A;    Home Medications:  Allergies as of 06/21/2020   No Known Allergies     Medication List       Accurate as of June 21, 2020  2:26 PM. If you have any questions, ask your nurse or doctor.        acetaminophen 325 MG tablet Commonly known as: TYLENOL Take 2 tablets (650 mg total) by mouth  every 6 (six) hours as needed for mild pain (or Fever >/= 101).   albuterol 108 (90 Base) MCG/ACT inhaler Commonly known as: VENTOLIN HFA Inhale 2 puffs into the lungs every 6 (six) hours as needed for wheezing or shortness of breath.   amLODipine 10 MG tablet Commonly known as: NORVASC Take 1 tablet (10 mg total) by mouth daily.   atorvastatin 80 MG tablet Commonly known as: LIPITOR Take 1 tablet (80 mg total) by mouth daily.   carvedilol 12.5 MG tablet Commonly known as: COREG Take 1 tablet (12.5 mg total) by mouth 2 (two) times daily. For BP and Heart   cholecalciferol 25 MCG (1000 UNIT) tablet Commonly known as: VITAMIN D Take 1,000 Units by mouth daily.   donepezil 10 MG tablet Commonly known as: ARICEPT Take 1 tablet (10 mg total) by mouth Jordan bedtime.   methimazole 5 MG tablet Commonly known as: TAPAZOLE Take 1 tablet (5 mg total) by mouth daily.   metoprolol succinate 25 MG 24 hr tablet Commonly known as: TOPROL-XL Take 37.5 mg by mouth daily.   nitroGLYCERIN 0.4 MG SL tablet Commonly known as: NITROSTAT Place 1 tablet (0.4 mg total) under the tongue every 5 (five) minutes x 3 doses as needed for chest pain.   traZODone 50 MG tablet Commonly known as: DESYREL Take 1 tablet (50 mg total) by mouth Jordan bedtime.  Allergies: No Known Allergies  Family History: Family History  Problem Relation Age of Onset  . Heart attack Mother   . Hypertension Mother   . Seizures Son   . Seizures Son   . Seizures Daughter     Social History:  reports that she has never smoked. She has never used smokeless tobacco. She reports that she does not drink alcohol and does not use drugs.  ROS: All other review of systems were reviewed and are negative except what is noted above in HPI  Physical Exam: BP (!) 119/54   Pulse 67   Temp 98.5 F (36.9 C)   Ht 5\' 2"  (1.575 m)   Wt 102 lb (46.3 kg)   BMI 18.66 kg/m   Constitutional:  Alert and oriented, No acute  distress. HEENT: Erin Jordan, moist mucus membranes.  Trachea midline, no masses. Cardiovascular: No clubbing, cyanosis, or edema. Respiratory: Normal respiratory effort, no increased work of breathing. GI: Abdomen is soft, nontender, nondistended, no abdominal masses GU: No CVA tenderness.  Lymph: No cervical or inguinal lymphadenopathy. Skin: No rashes, bruises or suspicious lesions. Neurologic: Grossly intact, no focal deficits, moving all 4 extremities. Psychiatric: Normal mood and affect.  Laboratory Data: Lab Results  Component Value Date   WBC 5.2 04/19/2020   HGB 11.5 04/19/2020   HCT 34.4 04/19/2020   MCV 93 04/19/2020   PLT 268 04/19/2020    Lab Results  Component Value Date   CREATININE 2.85 (H) 04/19/2020    No results found for: PSA  No results found for: TESTOSTERONE  Lab Results  Component Value Date   HGBA1C 6.1 01/21/2020    Urinalysis    Component Value Date/Time   COLORURINE YELLOW 03/28/2020 1017   APPEARANCEUR CLEAR 03/28/2020 1017   APPEARANCEUR Clear 12/22/2019 1153   LABSPEC 1.014 03/28/2020 1017   PHURINE 5.0 03/28/2020 1017   GLUCOSEU 50 (A) 03/28/2020 1017   HGBUR NEGATIVE 03/28/2020 [place] 03/28/2020 1017   BILIRUBINUR Negative 12/22/2019 1153   KETONESUR NEGATIVE 03/28/2020 1017   PROTEINUR >=300 (A) 03/28/2020 1017   UROBILINOGEN 0.2 05/19/2019 0947   UROBILINOGEN 0.2 06/01/2011 1822   NITRITE NEGATIVE 03/28/2020 1017   LEUKOCYTESUR NEGATIVE 03/28/2020 1017    Lab Results  Component Value Date   LABMICR 355.3 01/21/2020   WBCUA 0-5 12/22/2019   LABEPIT >10 (A) 12/22/2019   BACTERIA NONE SEEN 03/28/2020    Pertinent Imaging: CT 05/18/2020: Images reviewed and discussed with the patient No results found for this or any previous visit.  Results for orders placed during the hospital encounter of 08/10/19  US Venous Img Lower Bilateral (DVT)  Narrative CLINICAL DATA:  Pain in bilateral lower extremities for  1 week  EXAM: BILATERAL LOWER EXTREMITY VENOUS DOPPLER ULTRASOUND  TECHNIQUE: Jordan-scale sonography with graded compression, as well as color Doppler and duplex ultrasound were performed to evaluate the lower extremity deep venous systems from the level of the common femoral vein and including the common femoral, femoral, profunda femoral, popliteal and calf veins including the posterior tibial, peroneal and gastrocnemius veins when visible. The superficial great saphenous vein was also interrogated. Spectral Doppler was utilized to evaluate flow Jordan rest and with distal augmentation maneuvers in the common femoral, femoral and popliteal veins.  COMPARISON:  None.  FINDINGS: RIGHT LOWER EXTREMITY  Common Femoral Vein: No evidence of thrombus. Normal compressibility, respiratory phasicity and response to augmentation.  Saphenofemoral Junction: No evidence of thrombus. Normal compressibility and flow on color  Doppler imaging.  Profunda Femoral Vein: No evidence of thrombus. Normal compressibility and flow on color Doppler imaging.  Femoral Vein: No evidence of thrombus. Normal compressibility, respiratory phasicity and response to augmentation.  Popliteal Vein: No evidence of thrombus. Normal compressibility, respiratory phasicity and response to augmentation.  Calf Veins: No evidence of thrombus. Normal compressibility and flow on color Doppler imaging.  Superficial Great Saphenous Vein: No evidence of thrombus. Normal compressibility.  Venous Reflux:  None.  Other Findings:  None.  LEFT LOWER EXTREMITY  Common Femoral Vein: No evidence of thrombus. Normal compressibility, respiratory phasicity and response to augmentation.  Saphenofemoral Junction: No evidence of thrombus. Normal compressibility and flow on color Doppler imaging.  Profunda Femoral Vein: No evidence of thrombus. Normal compressibility and flow on color Doppler imaging.  Femoral Vein: No evidence  of thrombus. Normal compressibility, respiratory phasicity and response to augmentation.  Popliteal Vein: No evidence of thrombus. Normal compressibility, respiratory phasicity and response to augmentation.  Calf Veins: No evidence of thrombus. Normal compressibility and flow on color Doppler imaging.  Superficial Great Saphenous Vein: No evidence of thrombus. Normal compressibility.  Venous Reflux:  None.  Other Findings:  None.  IMPRESSION: No evidence of deep venous thrombosis in either lower extremity.   Electronically Signed By: Randa Ngo M.D. On: 08/10/2019 16:09  No results found for this or any previous visit.  No results found for this or any previous visit.  Results for orders placed during the hospital encounter of 10/15/18  US RENAL  Narrative CLINICAL DATA:  Chronic kidney disease  EXAM: RENAL / URINARY TRACT ULTRASOUND COMPLETE  COMPARISON:  CT dated April 24, 2018.  FINDINGS: Right Kidney:  Renal measurements: 7.3 x 3.4 x 4.4 cm = volume: 57.9 mL. There is a solid mass in the interpolar region measuring 3.2 x 3.6 x 3.2 cm. There is no hydronephrosis. No shadowing echogenic kidney stones.  Left Kidney:  Renal measurements: 10.1 x 3.9 x 5 cm = volume: 103 mL. Echogenicity within normal limits. No mass or hydronephrosis visualized.  Bladder:  Bladder was underdistended which severely limits evaluation.  IMPRESSION: 1. Solid right renal mass measuring approximately 3.6 cm. This is concerning for renal cell carcinoma until proven otherwise. Urologic follow-up is recommended. 2. No hydronephrosis. 3. Underdistended urinary bladder which limits evaluation.   Electronically Signed By: Constance Holster M.D. On: 10/15/2018 19:23  No results found for this or any previous visit.  No results found for this or any previous visit.  No results found for this or any previous visit.   Assessment & Plan:    1. Renal mass -RTC 6  months with a renal US - Urinalysis, Routine w reflex microscopic   No follow-ups on file.  Nicolette Bang, MD  [facility] [place]

## 2020-06-21 NOTE — Patient Instructions (Signed)

## 2020-06-21 NOTE — Progress Notes (Signed)
Urological Symptom Review  Patient is experiencing the following symptoms: none   Review of Systems  Gastrointestinal (upper)  : Negative for upper GI symptoms  Gastrointestinal (lower) : Negative for lower GI symptoms  Constitutional : Weight loss  Skin: Negative for skin symptoms  Eyes: Blurred vision  Ear/Nose/Throat : Negative for Ear/Nose/Throat symptoms  Hematologic/Lymphatic: Negative for Hematologic/Lymphatic symptoms  Cardiovascular : Negative for cardiovascular symptoms  Respiratory : Negative for respiratory symptoms  Endocrine: Negative for endocrine symptoms  Musculoskeletal: Negative for musculoskeletal symptoms  Neurological: Negative for neurological symptoms  Psychologic: Negative for psychiatric symptoms

## 2020-07-17 NOTE — Telephone Encounter (Signed)
Patient seen in office

## 2020-07-19 ENCOUNTER — Ambulatory Visit (HOSPITAL_COMMUNITY)
Admission: RE | Admit: 2020-07-19 | Discharge: 2020-07-19 | Disposition: A | Discharge status: Home / Self Care | Payer: Medicare HMO | Source: Ambulatory Visit | Attending: Anesthesiology | Admitting: Anesthesiology

## 2020-07-19 ENCOUNTER — Other Ambulatory Visit: Payer: Self-pay

## 2020-07-19 ENCOUNTER — Encounter (HOSPITAL_COMMUNITY): Payer: Self-pay

## 2020-07-19 ENCOUNTER — Encounter (HOSPITAL_COMMUNITY)
Admission: RE | Admit: 2020-07-19 | Discharge: 2020-07-19 | Disposition: A | Discharge status: Home / Self Care | Payer: Medicare HMO | Source: Ambulatory Visit | Attending: Surgery | Admitting: Surgery

## 2020-07-19 DIAGNOSIS — Z01818 Encounter for other preprocedural examination: Secondary | ICD-10-CM | POA: Insufficient documentation

## 2020-07-19 HISTORY — DX: Pneumonia, unspecified organism: J18.9

## 2020-07-19 HISTORY — DX: Hypothyroidism, unspecified: E03.9

## 2020-07-19 HISTORY — DX: Type 2 diabetes mellitus without complications: E11.9

## 2020-07-19 LAB — CBC
HCT: 34.8 % — ABNORMAL LOW (ref 36.0–46.0)
Hemoglobin: 11.4 g/dL — ABNORMAL LOW (ref 12.0–15.0)
MCH: 31.1 pg (ref 26.0–34.0)
MCHC: 32.8 g/dL (ref 30.0–36.0)
MCV: 95.1 fL (ref 80.0–100.0)
Platelets: 213 10*3/uL (ref 150–400)
RBC: 3.66 MIL/uL — ABNORMAL LOW (ref 3.87–5.11)
RDW: 12.8 % (ref 11.5–15.5)
WBC: 5.1 10*3/uL (ref 4.0–10.5)
nRBC: 0 % (ref 0.0–0.2)

## 2020-07-19 LAB — HEMOGLOBIN A1C
Hgb A1c MFr Bld: 5.4 % (ref 4.8–5.6)
Mean Plasma Glucose: 108.28 mg/dL

## 2020-07-19 LAB — BASIC METABOLIC PANEL
Anion gap: 9 (ref 5–15)
BUN: 31 mg/dL — ABNORMAL HIGH (ref 8–23)
CO2: 27 mmol/L (ref 22–32)
Calcium: 9.2 mg/dL (ref 8.9–10.3)
Chloride: 105 mmol/L (ref 98–111)
Creatinine, Ser: 1.93 mg/dL — ABNORMAL HIGH (ref 0.44–1.00)
GFR, Estimated: 27 mL/min — ABNORMAL LOW (ref >60)
Glucose, Bld: 173 mg/dL — ABNORMAL HIGH (ref 70–99)
Potassium: 4.5 mmol/L (ref 3.5–5.1)
Sodium: 141 mmol/L (ref 135–145)

## 2020-07-19 IMAGING — DX DG CHEST 2V
2 series · 2 of 2 positions shown · non-contrast
Comparison: [DATE] and older exams.

CLINICAL DATA: Pt preop for thyroid surgery set for [DATE]. Hx
cabg [L5], angiography w/stents [L5], hx htn, cad, DM.

EXAM:
CHEST - 2 VIEW

[chest pa]
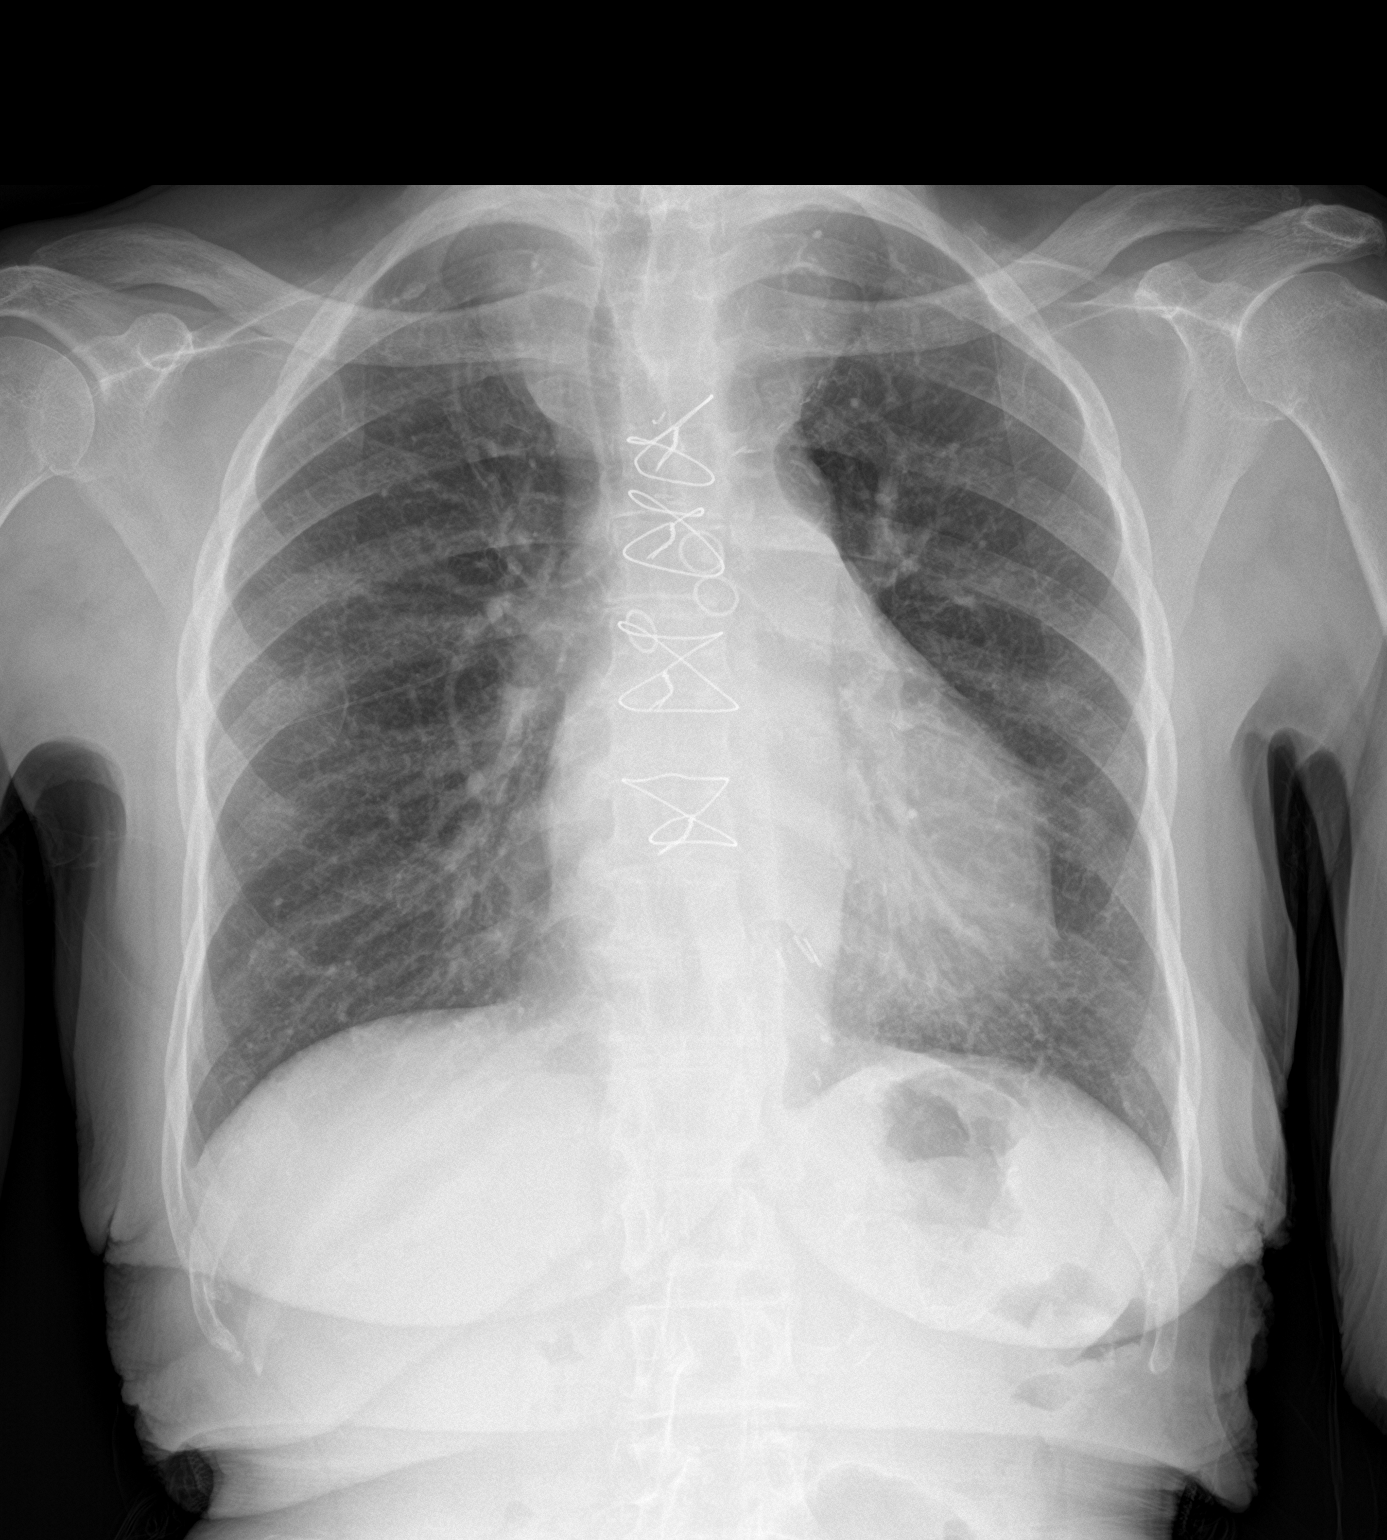

[chest lat]
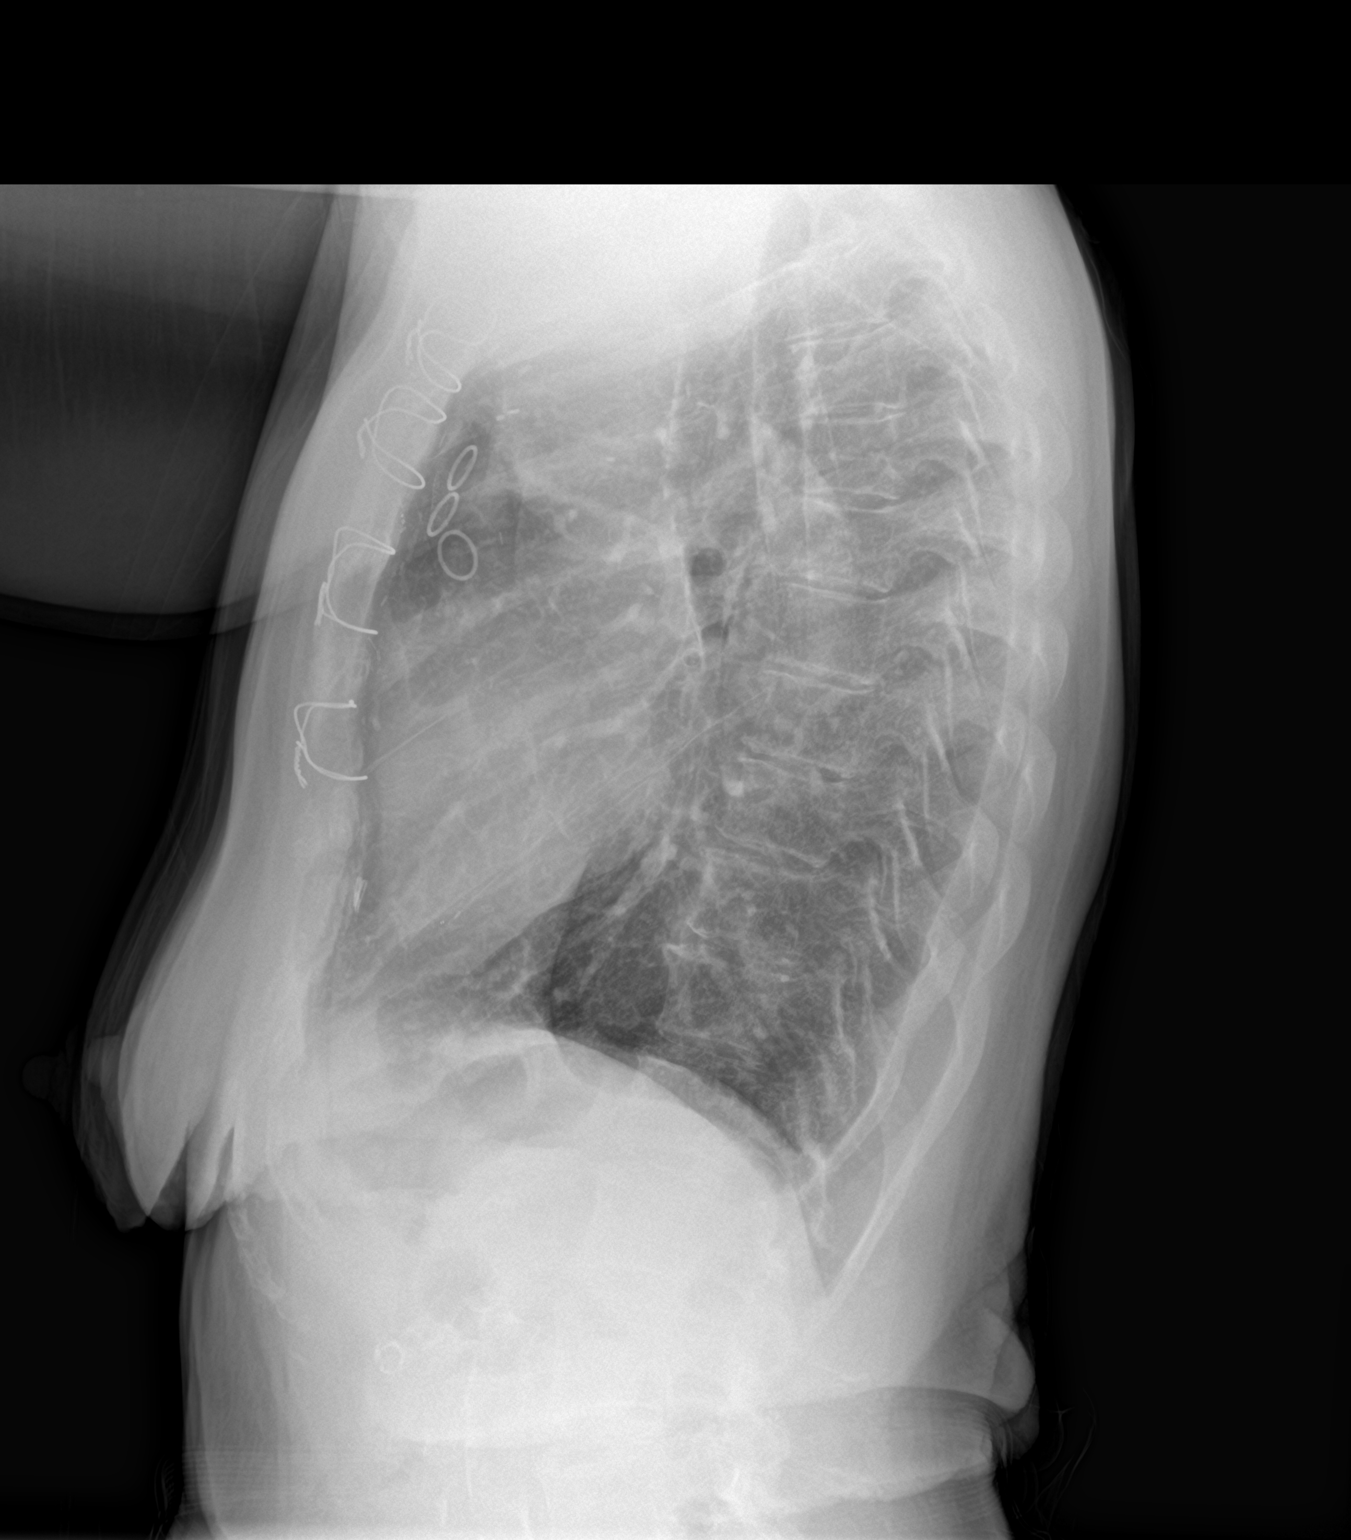

[2 of 2 positions shown; findings below may reference images not displayed]

FINDINGS: Stable changes from prior CABG surgery. Cardiac silhouette is normal
in size and configuration. No mediastinal or hilar masses or
evidence of adenopathy.

Lungs are clear.  No pleural effusion or pneumothorax.

Skeletal structures are intact.
IMPRESSION: No active cardiopulmonary disease.

## 2020-07-19 NOTE — Progress Notes (Signed)
DUE TO COVID-19 ONLY ONE VISITOR IS ALLOWED TO COME WITH YOU AND STAY IN THE WAITING ROOM ONLY DURING PRE OP AND PROCEDURE DAY OF SURGERY. THE 1 VISITOR  MAY VISIT WITH YOU AFTER SURGERY IN YOUR PRIVATE ROOM DURING VISITING HOURS ONLY!  YOU NEED TO HAVE A COVID 19 TEST ON__4/19/2022 _____ @_______ , THIS TEST MUST BE DONE BEFORE SURGERY,  COVID TESTING SITE [address] [place] [address], IT IS ON THE RIGHT GOING OUT [address] APPROXIMATELY  2 MINUTES PAST [employer] ON THE RIGHT. ONCE YOUR COVID TEST IS COMPLETED,  PLEASE BEGIN THE QUARANTINE INSTRUCTIONS AS OUTLINED IN YOUR HANDOUT.                Erin Jordan  07/19/2020   Your procedure is scheduled on:  07/28/2020   Report to [facility] Main  Entrance   Report to admitting at    0800AM     Call this number if you have problems the morning of surgery [phone]    REMEMBER: NO  SOLID FOOD CANDY OR GUM AFTER MIDNIGHT. CLEAR LIQUIDS UNTIL   0700am       . NOTHING BY MOUTH EXCEPT CLEAR LIQUIDS UNTIL   0700am     . PLEASE FINISH ENSURE DRINK PER SURGEON ORDER  WHICH NEEDS TO BE COMPLETED AT 0700am    .      CLEAR LIQUID DIET   Foods Allowed                                                                    Coffee and tea, regular and decaf                            Fruit ices (not with fruit pulp)                                      Iced Popsicles                                    Carbonated beverages, regular and diet                                    Cranberry, grape and apple juices Sports drinks like Gatorade Lightly seasoned clear broth or consume(fat free) Sugar, honey syrup ___________________________________________________________________      BRUSH YOUR TEETH MORNING OF SURGERY AND RINSE YOUR MOUTH OUT, NO CHEWING GUM CANDY OR MINTS.     Take these medicines the morning of surgery with A SIP OF WATER:  Toprol. Tapazole, coreg, amlodipine, Inhalers as usual and bring  DO  NOT TAKE ANY DIABETIC MEDICATIONS DAY OF YOUR SURGERY                               You may not have any metal on your body including hair pins and  piercings  Do not wear jewelry, make-up, lotions, powders or perfumes, deodorant             Do not wear nail polish on your fingernails.  Do not shave  48 hours prior to surgery.              Men may shave face and neck.   Do not bring valuables to the hospital. [place].  Contacts, dentures or bridgework may not be worn into surgery.  Leave suitcase in the car. After surgery it may be brought to your room.     Patients discharged the day of surgery will not be allowed to drive home. IF YOU ARE HAVING SURGERY AND GOING HOME THE SAME DAY, YOU MUST HAVE AN ADULT TO DRIVE YOU HOME AND BE WITH YOU FOR 24 HOURS. YOU MAY GO HOME BY TAXI OR UBER OR ORTHERWISE, BUT AN ADULT MUST ACCOMPANY YOU HOME AND STAY WITH YOU FOR 24 HOURS.  Name and phone number of your driver:  Special Instructions: N/A              Please read over the following fact sheets you were given: _____________________________________________________________________  [facility] - Preparing for Surgery Before surgery, you can play an important role.  Because skin is not sterile, your skin needs to be as free of germs as possible.  You can reduce the number of germs on your skin by washing with CHG (chlorahexidine gluconate) soap before surgery.  CHG is an antiseptic cleaner which kills germs and bonds with the skin to continue killing germs even after washing. Please DO NOT use if you have an allergy to CHG or antibacterial soaps.  If your skin becomes reddened/irritated stop using the CHG and inform your nurse when you arrive at Short Stay. Do not shave (including legs and underarms) for at least 48 hours prior to the first CHG shower.  You may shave your face/neck. Please follow these instructions carefully:  1.  Shower  with CHG Soap the night before surgery and the  morning of Surgery.  2.  If you choose to wash your hair, wash your hair first as usual with your  normal  shampoo.  3.  After you shampoo, rinse your hair and body thoroughly to remove the  shampoo.                           4.  Use CHG as you would any other liquid soap.  You can apply chg directly  to the skin and wash                       Gently with a scrungie or clean washcloth.  5.  Apply the CHG Soap to your body ONLY FROM THE NECK DOWN.   Do not use on face/ open                           Wound or open sores. Avoid contact with eyes, ears mouth and genitals (private parts).                       Wash face,  Genitals (private parts) with your normal soap.             6.  Wash thoroughly, paying special attention to the area where your surgery  will be performed.  7.  Thoroughly rinse your body with warm water from the neck down.  8.  DO NOT shower/wash with your normal soap after using and rinsing off  the CHG Soap.                9.  Pat yourself dry with a clean towel.            10.  Wear clean pajamas.            11.  Place clean sheets on your bed the night of your first shower and do not  sleep with pets. Day of Surgery : Do not apply any lotions/deodorants the morning of surgery.  Please wear clean clothes to the hospital/surgery center.  FAILURE TO FOLLOW THESE INSTRUCTIONS MAY RESULT IN THE CANCELLATION OF YOUR SURGERY PATIENT SIGNATURE_________________________________  NURSE SIGNATURE__________________________________  ________________________________________________________________________

## 2020-07-19 NOTE — Progress Notes (Addendum)
Anesthesia Review:  PCP:  Delight Ovens in [place] 04/19/20- epic  Cardiologist : DR Johnny Bridge  LOV with Cardiology- 03/23/20- Lisbeth Renshaw [facility]  06/21/20- Renal mass office visit - DR McKenzie  Chest x-ray : 07/19/20 [facility] [place] CXR- 03/28/20- epic  CXR- 07/19/20 EKG : 03/29/20 - epic  Echo : Stress test: Cardiac Cath : 2019  Activity level:  Sleep Study/ CPAP : Fasting Blood Sugar :      / Checks Blood Sugar -- times a day:   Blood Thinner/ Instructions /Last Dose: ASA / Instructions/ Last Dose :  Pt has Dementia .  Accompanied by granddaughter at preop appt.  Granddaugther reports that she is  Not POA and does not report any POA bein in place. To be with gmother during day day of surgery but son or daughter will be here that nite.  Pt lives at home with son and granddaughter.  Pt able to answer some questions at preop appt.  Most of questons completed by granddaughter.   Recent hospitalization- 03/28/20- pneumonia- epic And 04/02/2020- Small Bowel OBstructon Admission  Hx of Diabetes- hgba1c-07/19/20- 5.4 BMP done 07/19/20 routed to DR Gerkin.

## 2020-07-20 NOTE — Progress Notes (Signed)
Anesthesia Chart Review   Case: [id number] Date/Time: 07/28/20 0945   Procedure: TOTAL THYROIDECTOMY (N/A ) - 2 HOURS   Anesthesia type: General   Pre-op diagnosis: multiple thyroid goiter   Location: [facility] ROOM 01 / [facility]   Surgeons: Armandina Gemma, MD      DISCUSSION:75 y.o. never smoker with h/o HTN, Dm II, CKD Stage IV, CAD (CABG 2006, PCI 06/26/17), multiple thyroid goiter scheduled for above procedure 07/28/20 with Dr. Armandina Gemma.   Per cardiology note 02/21/2020, "Chart reviewed as part of pre-operative protocol coverage. Given past medical history and time since last visit, based on ACC/AHA guidelines, Erin Jordan would be at acceptable risk for the planned procedure without further cardiovascular testing.   Her RCRI is a class III risk, 6.6% risk of major cardiac event.  She is able to complete greater than 4 METS of physical activity."  Last seen by PCP 04/19/20. VS: BP 133/60   Pulse 78   Temp 37.2 C (Oral)   Resp 16   SpO2 100%   PROVIDERS: [name], FNP is PCP   Rozann Lesches, MD is Cardiologist  LABS: Labs reviewed: Acceptable for surgery. (all labs ordered are listed, but only abnormal results are displayed)  Labs Reviewed  BASIC METABOLIC PANEL - Abnormal; Notable for the following components:      Result Value   Glucose, Bld 173 (*)    BUN 31 (*)    Creatinine, Ser 1.93 (*)    GFR, Estimated 27 (*)    All other components within normal limits  CBC - Abnormal; Notable for the following components:   RBC 3.66 (*)    Hemoglobin 11.4 (*)    HCT 34.8 (*)    All other components within normal limits  HEMOGLOBIN A1C     IMAGES:   EKG:   CV: Echo 06/03/2011 Study Conclusions   - Left ventricle: The cavity size was normal.Mild tomoderate  LVH with disproportionate septal thickening. Systolic  function was normal. The estimated ejection fraction was  in the range of 60% to 65%. Wall motion was normal; there  were no regional wall  motion abnormalities.  - Aortic valve: Mildly calcified annulus.  - Mitral valve: Calcified annulus.  - Right ventricle: The cavity size was normal. Wall  thickness was mildly to moderately increased.  - Atrial septum: No defect or patent foramen ovale was  identified.   Past Medical History:  Diagnosis Date  . Arthritis   . Cancer of kidney ([place])   . CKD (chronic kidney disease), stage IV ([place])   . Coronary atherosclerosis of native coronary artery 2006   Multivessel status post CABG in [place], graft disease documented March 2019 with DES to SVG to diagonal  . Diabetes mellitus without complication ([place])    hx of not currently on meds at preop visit of 07/19/20  . Essential hypertension   . 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 History of diabetes mellitus, type II   . Hyperlipidemia   . Hypothyroidism   . NSTEMI (non-ST elevated myocardial infarction) ([place]) 06/24/2017  . Pneumonia    hx of 2021   . Renal hematoma   . Right renal mass     Past Surgical History:  Procedure Laterality Date  . ABDOMINAL HYSTERECTOMY    . CORONARY ARTERY BYPASS GRAFT  2006   [place], [place]  . CORONARY STENT INTERVENTION N/A 06/25/2017   Procedure: CORONARY STENT INTERVENTION;  Surgeon: Jettie Booze, MD;  Location:  [facility];  Service: Cardiovascular;  Laterality: N/A;  . IR RADIOLOGIST EVAL & MGMT  07/08/2019  . LEFT HEART CATH AND CORS/GRAFTS ANGIOGRAPHY N/A 06/25/2017   Procedure: LEFT HEART CATH AND CORS/GRAFTS ANGIOGRAPHY;  Surgeon: Jettie Booze, MD;  Location: [facility];  Service: Cardiovascular;  Laterality: N/A;    MEDICATIONS: . acetaminophen (TYLENOL) 325 MG tablet  . albuterol (VENTOLIN HFA) 108 (90 Base) MCG/ACT inhaler  . amLODipine (NORVASC) 10 MG tablet  . atorvastatin (LIPITOR) 80 MG tablet  . carvedilol (COREG) 12.5 MG tablet  . cholecalciferol (VITAMIN D) 25 MCG (1000 UT) tablet  . donepezil (ARICEPT) 10 MG tablet  .  methimazole (TAPAZOLE) 5 MG tablet  . metoprolol succinate (TOPROL-XL) 25 MG 24 hr tablet  . nitroGLYCERIN (NITROSTAT) 0.4 MG SL tablet  . traZODone (DESYREL) 50 MG tablet   No current facility-administered medications for this encounter.     Konrad Felix, PA-C [facility] Pre-Surgical Testing [phone]

## 2020-07-22 ENCOUNTER — Encounter (HOSPITAL_COMMUNITY): Payer: Self-pay | Admitting: Surgery

## 2020-07-22 DIAGNOSIS — E049 Nontoxic goiter, unspecified: Secondary | ICD-10-CM | POA: Diagnosis present

## 2020-07-22 NOTE — H&P (Signed)
General Surgery [facility]  Erin Jordan DOB: 12/12/1945 Widowed / Language: English / Race: Black or African American Female   History of Present Illness   The patient is a 75 year old female who presents with a complaint of Enlarged thyroid.  CHIEF COMPLAINT: thyroid goiter, hyperthyroidism, multiple thyroid nodules  Patient is referred by Dr. Aviva Signs in [place] for surgical evaluation and management of multinodular thyroid goiter with hyperthyroidism. Patient's endocrinologist is Dr. Bud Face. Patient was evaluated in August 2019 for possible thyroidectomy. However, due to cardiac issues, surgery was postponed for at least a year while the patient was anticoagulated. Patient has seen her cardiologist in follow-up, Dr. Rozann Lesches, and has been released to proceed with thyroid surgery. Anticoagulation has been discontinued. Patient was seen and evaluated in October 2021 in preparation for thyroid surgery. Unfortunately she developed bronchopneumonia and required hospitalization and her procedure was postponed. She has recovered nicely. She has been released by her medical team to proceed with thyroid surgery. She presents today accompanied by her daughter. They feel that the thyroid is become even larger in the interim. They would like to proceed with thyroidectomy in the near future.   Allergies  No Known Allergies   Allergies Reconciled   Medication History  Carvedilol (6.25MG  Tablet, Oral) Active. Donepezil HCl (10MG  Tablet, Oral) Active. Metoprolol Succinate ER (25MG  Tablet ER 24HR, Oral) Active. traZODone HCl (50MG  Tablet, Oral) Active. Atorvastatin Calcium (80MG  Tablet, Oral) Active. amLODIPine Besylate (10MG  Tablet, Oral) Active. Aspirin (81MG  Tablet, Oral) Active. Lipitor (40MG  Tablet, Oral) Active. GlipiZIDE (10MG  Tablet, Oral) Active. Tapazole (5MG  Tablet, Oral) Active. Metoprolol Succinate (50MG  CP24 Sprinkle,  Oral) Active. Nitroglycerin (0.4MG  Tab Sublingual, Sublingual as needed) Active. Medications Reconciled   Physical Exam   GENERAL APPEARANCE Development: normal Nutritional status: normal Gross deformities: none  SKIN Rash, lesions, ulcers: none Induration, erythema: none Nodules: none palpable  EYES Conjunctiva and lids: normal Pupils: equal and reactive Iris: normal bilaterally  EARS, NOSE, MOUTH, THROAT External ears: no lesion or deformity External nose: no lesion or deformity Hearing: grossly normal Due to Covid-19 pandemic, patient is wearing a mask.  NECK Symmetric: no Trachea: midline Thyroid: Markedly enlarged thyroid, left lobe greater than right, diffusely nodular without discrete or dominant mass. No tenderness. No stridor.  CHEST Respiratory effort: normal Retraction or accessory muscle use: no Breath sounds: normal bilaterally Rales, rhonchi, wheeze: none  CARDIOVASCULAR Auscultation: regular rhythm, normal rate Murmurs: none Pulses: radial pulse 2+ palpable Lower extremity edema: none  MUSCULOSKELETAL Station and gait: normal Digits and nails: no clubbing or cyanosis Muscle strength: grossly normal all extremities Range of motion: grossly normal all extremities Deformity: none  LYMPHATIC Cervical: none palpable Supraclavicular: none palpable  PSYCHIATRIC Oriented to person, place, and time: yes Mood and affect: normal for situation Judgment and insight: appropriate for situation    Assessment & Plan   ENLARGED THYROID (E04.9) MULTIPLE THYROID NODULES (E04.2) HYPERTHYROIDISM (E05.90)  Patient returns to my practice accompanied by her daughter to discuss proceeding with total thyroidectomy. This procedure had originally been scheduled in late 2021 but was canceled due to bronchopneumonia. The patient has now recovered and then released by her medical team to proceed with thyroid surgery.  We discussed total thyroidectomy as the  procedure of choice. We discussed the risk of the procedure given the markedly enlarged thyroid gland leading to increased risk of recurrent laryngeal nerve injury and alteration in voice quality. We also discussed the risk of injury to parathyroid  glands and hypocalcemia following the procedure. We discussed the hospital stay to be anticipated. We discussed the size and location of the surgical incision. The patient and her daughter understand and agree to proceed with surgery in the near future.  Patient will need to be evaluated preoperatively by anesthesiology regarding airway management at the time of surgery. We will ask for an anesthesia consultation.  The risks and benefits of the procedure have been discussed at length with the patient. The patient understands the proposed procedure, potential alternative treatments, and the course of recovery to be expected. All of the patient's questions have been answered at this time. The patient wishes to proceed with surgery.   Armandina Gemma, MD [facility] Office: [phone]

## 2020-07-25 ENCOUNTER — Other Ambulatory Visit (HOSPITAL_COMMUNITY)
Admission: RE | Admit: 2020-07-25 | Discharge: 2020-07-25 | Disposition: A | Discharge status: Home / Self Care | Payer: Medicare HMO | Source: Ambulatory Visit | Attending: Surgery | Admitting: Surgery

## 2020-07-25 DIAGNOSIS — Z20822 Contact with and (suspected) exposure to covid-19: Secondary | ICD-10-CM | POA: Diagnosis not present

## 2020-07-25 DIAGNOSIS — Z01812 Encounter for preprocedural laboratory examination: Secondary | ICD-10-CM | POA: Diagnosis present

## 2020-07-25 LAB — SARS CORONAVIRUS 2 (TAT 6-24 HRS): SARS Coronavirus 2: NEGATIVE

## 2020-07-27 NOTE — Progress Notes (Signed)
Family member present in lobby.  States pt lost her soap and preop instructions for day of surgery.  Reprinted preop instructions and hibiclens soap given to family member.  REviewed preop instructions with family  Member.

## 2020-07-27 NOTE — Progress Notes (Addendum)
In reviewin g preop instructions and meds realized pt was on coreg and toprol listed on medicaitons in epic.  Attempted to call granddaughtter, Erin Jordan to clarify meds prior to surgery.  LVMM at 3pm.  Called son, Erin Jordan at 3pm and son called back and stated he would try and get in touch with Erin Jordan or another family member who stays with pt, Erin Jordan. At 420 pm have not heard from Erin Jordan or from other familiy members regarding clarification of meds prior to surgery on 07/28/20.  Called and LVMM again for Erin Jordan at [phone].  Called son, Erin Jordan back again for clariification of meds and he stated he had tried to get in touch with Erin Jordan and left her a message.  Son stated he would try and get in touch with his sister also.  Informed son that it was important they call nurse back to clarify meds.  Son voiced understanding.   At 1755 pm attempted to call son, Erin Jordan back and was unable to speak with him or leave him a message.  LVMM on phone number of [phone] which is granddaughter of pt .  LVMM stating to bring a list of meds that pt takes am of surgery with times of day that pt takes meds and pt is to take Carvedilol( coreg) am of surgery if she takes in am and to take Toprol ( MEtoprolol) am of surgery if she takes in am.  [facility] short Stay telephone number of [phone] as resource phone number and they are available until 7pm.  Phone call witnessed by Zelphia Cairo, RN .

## 2020-07-28 ENCOUNTER — Ambulatory Visit (HOSPITAL_COMMUNITY): Payer: Medicare HMO | Admitting: Physician Assistant

## 2020-07-28 ENCOUNTER — Ambulatory Visit (HOSPITAL_COMMUNITY)
Admission: RE | Admit: 2020-07-28 | Discharge: 2020-07-29 | Disposition: A | Discharge status: Home / Self Care | Payer: Medicare HMO | Attending: Surgery | Admitting: Surgery

## 2020-07-28 ENCOUNTER — Encounter (HOSPITAL_COMMUNITY)
Admission: RE | Disposition: A | Discharge status: Home / Self Care | Payer: Self-pay | Source: Home / Self Care | Attending: Surgery

## 2020-07-28 ENCOUNTER — Ambulatory Visit (HOSPITAL_COMMUNITY): Payer: Medicare HMO | Admitting: Certified Registered"

## 2020-07-28 ENCOUNTER — Telehealth: Payer: Self-pay | Admitting: Cardiology

## 2020-07-28 ENCOUNTER — Encounter (HOSPITAL_COMMUNITY): Payer: Self-pay | Admitting: Surgery

## 2020-07-28 DIAGNOSIS — E0520 Thyrotoxicosis with toxic multinodular goiter without thyrotoxic crisis or storm: Secondary | ICD-10-CM | POA: Diagnosis present

## 2020-07-28 DIAGNOSIS — Z7984 Long term (current) use of oral hypoglycemic drugs: Secondary | ICD-10-CM | POA: Insufficient documentation

## 2020-07-28 DIAGNOSIS — Z7982 Long term (current) use of aspirin: Secondary | ICD-10-CM | POA: Insufficient documentation

## 2020-07-28 DIAGNOSIS — E0590 Thyrotoxicosis, unspecified without thyrotoxic crisis or storm: Secondary | ICD-10-CM | POA: Diagnosis present

## 2020-07-28 DIAGNOSIS — E042 Nontoxic multinodular goiter: Secondary | ICD-10-CM

## 2020-07-28 DIAGNOSIS — E049 Nontoxic goiter, unspecified: Secondary | ICD-10-CM | POA: Diagnosis present

## 2020-07-28 DIAGNOSIS — Z79899 Other long term (current) drug therapy: Secondary | ICD-10-CM | POA: Insufficient documentation

## 2020-07-28 HISTORY — PX: THYROIDECTOMY: SHX17

## 2020-07-28 HISTORY — DX: Thyrotoxicosis with toxic multinodular goiter without thyrotoxic crisis or storm: E05.20

## 2020-07-28 LAB — GLUCOSE, CAPILLARY: Glucose-Capillary: 178 mg/dL — ABNORMAL HIGH (ref 70–99)

## 2020-07-28 SURGERY — THYROIDECTOMY
Anesthesia: General | Site: Neck

## 2020-07-28 MED ORDER — ROCURONIUM BROMIDE 10 MG/ML (PF) SYRINGE
PREFILLED_SYRINGE | INTRAVENOUS | Status: AC
  Filled 2020-07-28: qty 10

## 2020-07-28 MED ORDER — CALCIUM CARBONATE ANTACID 500 MG PO CHEW: 2.0000 | CHEWABLE_TABLET | Freq: Three times a day (TID) | ORAL | 1 refills | Status: DC

## 2020-07-28 MED ORDER — METOPROLOL SUCCINATE ER 25 MG PO TB24: 37.5000 mg | ORAL_TABLET | Freq: Every day | ORAL | Status: DC

## 2020-07-28 MED ORDER — ORAL CARE MOUTH RINSE
15.0000 mL | Freq: Once | OROMUCOSAL | Status: AC
  Administered 2020-07-28: 15 mL via OROMUCOSAL

## 2020-07-28 MED ORDER — EPHEDRINE 5 MG/ML INJ
INTRAVENOUS | Status: AC
  Filled 2020-07-28: qty 10

## 2020-07-28 MED ORDER — ONDANSETRON HCL 4 MG/2ML IJ SOLN
INTRAMUSCULAR | Status: AC
  Filled 2020-07-28: qty 2

## 2020-07-28 MED ORDER — CARVEDILOL 6.25 MG PO TABS
6.2500 mg | ORAL_TABLET | Freq: Two times a day (BID) | ORAL | Status: DC
  Administered 2020-07-28 – 2020-07-29 (×2): 6.25 mg via ORAL
  Filled 2020-07-28 (×2): qty 1

## 2020-07-28 MED ORDER — PROPOFOL 10 MG/ML IV BOLUS
INTRAVENOUS | Status: DC | PRN
  Administered 2020-07-28 (×2): 60 mg via INTRAVENOUS

## 2020-07-28 MED ORDER — AMISULPRIDE (ANTIEMETIC) 5 MG/2ML IV SOLN: 10.0000 mg | Freq: Once | INTRAVENOUS | Status: DC | PRN

## 2020-07-28 MED ORDER — OXYCODONE HCL 5 MG/5ML PO SOLN
5.0000 mg | Freq: Once | ORAL | Status: DC | PRN
Start: 2020-07-28 — End: 2020-07-28

## 2020-07-28 MED ORDER — HYDROMORPHONE HCL 1 MG/ML IJ SOLN
INTRAMUSCULAR | Status: AC
  Filled 2020-07-28: qty 1

## 2020-07-28 MED ORDER — SUGAMMADEX SODIUM 200 MG/2ML IV SOLN
INTRAVENOUS | Status: DC | PRN
  Administered 2020-07-28: 100 mg via INTRAVENOUS

## 2020-07-28 MED ORDER — CHLORHEXIDINE GLUCONATE CLOTH 2 % EX PADS: 6.0000 | MEDICATED_PAD | Freq: Once | CUTANEOUS | Status: DC

## 2020-07-28 MED ORDER — LACTATED RINGERS IV SOLN: INTRAVENOUS | Status: DC

## 2020-07-28 MED ORDER — ALBUTEROL SULFATE HFA 108 (90 BASE) MCG/ACT IN AERS
2.0000 | INHALATION_SPRAY | Freq: Four times a day (QID) | RESPIRATORY_TRACT | Status: DC | PRN
  Filled 2020-07-28: qty 6.7

## 2020-07-28 MED ORDER — DEXAMETHASONE SODIUM PHOSPHATE 10 MG/ML IJ SOLN
INTRAMUSCULAR | Status: DC | PRN
  Administered 2020-07-28: 4 mg via INTRAVENOUS

## 2020-07-28 MED ORDER — ONDANSETRON HCL 4 MG/2ML IJ SOLN
INTRAMUSCULAR | Status: DC | PRN
  Administered 2020-07-28: 4 mg via INTRAVENOUS

## 2020-07-28 MED ORDER — LEVOTHYROXINE SODIUM 88 MCG PO TABS: 88.0000 ug | ORAL_TABLET | Freq: Every day | ORAL | 2 refills | Status: DC

## 2020-07-28 MED ORDER — HYDROMORPHONE HCL 1 MG/ML IJ SOLN: 1.0000 mg | INTRAMUSCULAR | Status: DC | PRN

## 2020-07-28 MED ORDER — HYDROMORPHONE HCL 1 MG/ML IJ SOLN
0.2500 mg | INTRAMUSCULAR | Status: DC | PRN
  Administered 2020-07-28 (×2): 0.5 mg via INTRAVENOUS

## 2020-07-28 MED ORDER — AMLODIPINE BESYLATE 10 MG PO TABS
10.0000 mg | ORAL_TABLET | Freq: Every day | ORAL | Status: DC
  Administered 2020-07-29: 10 mg via ORAL
  Filled 2020-07-28: qty 1

## 2020-07-28 MED ORDER — CALCIUM CARBONATE 1250 (500 CA) MG PO TABS
2.0000 | ORAL_TABLET | Freq: Three times a day (TID) | ORAL | Status: DC
  Administered 2020-07-28 – 2020-07-29 (×3): 1000 mg via ORAL
  Filled 2020-07-28 (×4): qty 1

## 2020-07-28 MED ORDER — ROCURONIUM BROMIDE 10 MG/ML (PF) SYRINGE
PREFILLED_SYRINGE | INTRAVENOUS | Status: DC | PRN
  Administered 2020-07-28: 50 mg via INTRAVENOUS
  Administered 2020-07-28 (×2): 10 mg via INTRAVENOUS

## 2020-07-28 MED ORDER — TRAMADOL HCL 50 MG PO TABS: 50.0000 mg | ORAL_TABLET | Freq: Four times a day (QID) | ORAL | 0 refills | Status: DC | PRN

## 2020-07-28 MED ORDER — EPHEDRINE SULFATE-NACL 50-0.9 MG/10ML-% IV SOSY
PREFILLED_SYRINGE | INTRAVENOUS | Status: DC | PRN
  Administered 2020-07-28: 10 mg via INTRAVENOUS

## 2020-07-28 MED ORDER — FENTANYL CITRATE (PF) 100 MCG/2ML IJ SOLN
INTRAMUSCULAR | Status: AC
  Filled 2020-07-28: qty 2

## 2020-07-28 MED ORDER — LIDOCAINE 2% (20 MG/ML) 5 ML SYRINGE
INTRAMUSCULAR | Status: AC
  Filled 2020-07-28: qty 5

## 2020-07-28 MED ORDER — OXYCODONE HCL 5 MG PO TABS: 5.0000 mg | ORAL_TABLET | ORAL | Status: DC | PRN

## 2020-07-28 MED ORDER — ONDANSETRON HCL 4 MG/2ML IJ SOLN: 4.0000 mg | Freq: Four times a day (QID) | INTRAMUSCULAR | Status: DC | PRN

## 2020-07-28 MED ORDER — ACETAMINOPHEN 650 MG RE SUPP: 650.0000 mg | Freq: Four times a day (QID) | RECTAL | Status: DC | PRN

## 2020-07-28 MED ORDER — PROPOFOL 10 MG/ML IV BOLUS
INTRAVENOUS | Status: AC
  Filled 2020-07-28: qty 20

## 2020-07-28 MED ORDER — FENTANYL CITRATE (PF) 100 MCG/2ML IJ SOLN
INTRAMUSCULAR | Status: DC | PRN
  Administered 2020-07-28 (×2): 25 ug via INTRAVENOUS
  Administered 2020-07-28: 50 ug via INTRAVENOUS

## 2020-07-28 MED ORDER — ACETAMINOPHEN 325 MG PO TABS
650.0000 mg | ORAL_TABLET | Freq: Four times a day (QID) | ORAL | Status: DC | PRN
Start: 2020-07-28 — End: 2020-07-29

## 2020-07-28 MED ORDER — PROMETHAZINE HCL 25 MG/ML IJ SOLN: 6.2500 mg | INTRAMUSCULAR | Status: DC | PRN

## 2020-07-28 MED ORDER — OXYCODONE HCL 5 MG PO TABS: 5.0000 mg | ORAL_TABLET | Freq: Once | ORAL | Status: DC | PRN

## 2020-07-28 MED ORDER — LIDOCAINE 2% (20 MG/ML) 5 ML SYRINGE
INTRAMUSCULAR | Status: DC | PRN
  Administered 2020-07-28: 20 mg via INTRAVENOUS

## 2020-07-28 MED ORDER — CHLORHEXIDINE GLUCONATE 0.12 % MT SOLN: 15.0000 mL | Freq: Once | OROMUCOSAL | Status: AC

## 2020-07-28 MED ORDER — CEFAZOLIN SODIUM-DEXTROSE 2-4 GM/100ML-% IV SOLN
2.0000 g | INTRAVENOUS | Status: AC
  Administered 2020-07-28: 2 g via INTRAVENOUS
  Filled 2020-07-28: qty 100

## 2020-07-28 MED ORDER — TRAMADOL HCL 50 MG PO TABS: 50.0000 mg | ORAL_TABLET | Freq: Four times a day (QID) | ORAL | Status: DC | PRN

## 2020-07-28 MED ORDER — ONDANSETRON 4 MG PO TBDP: 4.0000 mg | ORAL_TABLET | Freq: Four times a day (QID) | ORAL | Status: DC | PRN

## 2020-07-28 MED ORDER — DEXAMETHASONE SODIUM PHOSPHATE 10 MG/ML IJ SOLN
INTRAMUSCULAR | Status: AC
  Filled 2020-07-28: qty 1

## 2020-07-28 MED ORDER — TRAZODONE HCL 50 MG PO TABS
50.0000 mg | ORAL_TABLET | Freq: Every day | ORAL | Status: DC
  Administered 2020-07-28: 50 mg via ORAL
  Filled 2020-07-28: qty 1

## 2020-07-28 MED ORDER — 0.9 % SODIUM CHLORIDE (POUR BTL) OPTIME
TOPICAL | Status: DC | PRN
  Administered 2020-07-28: 1000 mL

## 2020-07-28 MED ORDER — SODIUM CHLORIDE 0.45 % IV SOLN: INTRAVENOUS | Status: DC

## 2020-07-28 SURGICAL SUPPLY — 31 items

## 2020-07-28 NOTE — Transfer of Care (Signed)
Immediate Anesthesia Transfer of Care Note  Patient: Erin Jordan  Procedure(s) Performed: TOTAL THYROIDECTOMY (N/A Neck)  Patient Location: PACU  Anesthesia Type:General  Level of Consciousness: awake and alert   Airway & Oxygen Therapy: Patient Spontanous Breathing and Patient connected to face mask oxygen  Post-op Assessment: Report given to RN, Post -op Vital signs reviewed and stable and Patient moving all extremities X 4  Post vital signs: Reviewed and stable  Last Vitals:  Vitals Value Taken Time  BP    Temp    Pulse 72 07/28/20 1237  Resp 15 07/28/20 1237  SpO2 100 % 07/28/20 1237  Vitals shown include unvalidated device data.  Last Pain:  Vitals:   07/28/20 0817  TempSrc: Oral         Complications: No complications documented.

## 2020-07-28 NOTE — Plan of Care (Signed)
  Problem: Coping: Goal: Level of anxiety will decrease Outcome: Progressing   Problem: Pain Managment: Goal: General experience of comfort will improve Outcome: Progressing   Problem: Safety: Goal: Ability to remain free from injury will improve Outcome: Progressing   

## 2020-07-28 NOTE — Anesthesia Procedure Notes (Signed)
Procedure Name: Intubation Date/Time: 07/28/2020 10:12 AM Performed by: Niel Hummer, CRNA Pre-anesthesia Checklist: Patient identified, Emergency Drugs available, Suction available and Patient being monitored Patient Re-evaluated:Patient Re-evaluated prior to induction Oxygen Delivery Method: Circle system utilized Preoxygenation: Pre-oxygenation with 100% oxygen Induction Type: IV induction Ventilation: Mask ventilation without difficulty Laryngoscope Size: Mac and 3 Grade View: Grade I Tube type: Oral Tube size: 7.0 mm Number of attempts: 1 Airway Equipment and Method: Stylet Placement Confirmation: ETT inserted through vocal cords under direct vision,  positive ETCO2 and breath sounds checked- equal and bilateral Secured at: 20 cm Tube secured with: Tape Dental Injury: Teeth and Oropharynx as per pre-operative assessment

## 2020-07-28 NOTE — Telephone Encounter (Signed)
Pt c/o medication issue:  1. Name of Medication:  carvedilol (COREG) tablet 12.5 mg  metoprolol succinate (TOPROL-XL) 24 hr tablet 37.5 mg    2. How are you currently taking this medication (dosage and times per day)? Not sure  3. Are you having a reaction (difficulty breathing--STAT)? no  4. What is your medication issue? Terry from [facility] states have a question on why the patient is prescribed 2 beta blockers. Phone: [phone]

## 2020-07-28 NOTE — Telephone Encounter (Signed)
I spoke with pharmacy and relayed that when D.Dunn, PA-C saw patient in office on 03/23/20, her Toprol was stopped and Coreg 6.25 mg BID was ordered. Patient has never returned for office visit since. They have number for granddaughter who cares for patient and will speak with her.

## 2020-07-28 NOTE — Anesthesia Postprocedure Evaluation (Signed)
Anesthesia Post Note  Patient: SAHVANNA MCMANIGAL  Procedure(s) Performed: TOTAL THYROIDECTOMY (N/A Neck)     Patient location during evaluation: PACU Anesthesia Type: General Level of consciousness: awake and alert Pain management: pain level controlled Vital Signs Assessment: post-procedure vital signs reviewed and stable Respiratory status: spontaneous breathing, nonlabored ventilation and respiratory function stable Cardiovascular status: blood pressure returned to baseline and stable Postop Assessment: no apparent nausea or vomiting Anesthetic complications: no   No complications documented.  Last Vitals:  Vitals:   07/28/20 1400 07/28/20 1425  BP:  (!) 157/72  Pulse:  73  Resp:  18  Temp:    SpO2: 100% 100%    Last Pain:  Vitals:   07/28/20 1425  TempSrc: Oral  PainSc:                  Lynda Rainwater

## 2020-07-28 NOTE — Discharge Instructions (Signed)
[facility]  THYROID & PARATHYROID SURGERY:  POST-OP INSTRUCTIONS  Always review your discharge instruction sheet from the facility where your surgery was performed.  A prescription for pain medication may be given to you upon discharge.  Take your pain medication as prescribed.  If narcotic pain medicine is not needed, then you may take acetaminophen (Tylenol) or ibuprofen (Advil) as needed.  Take your usually prescribed medications unless otherwise directed.  If you need a refill on your pain medication, please contact our office during regular business hours.  Prescriptions cannot be processed by our office after 5 pm or on weekends.  Start with a light diet upon arrival home, such as soup and crackers or toast.  Be sure to drink plenty of fluids daily.  Resume your normal diet the day after surgery.  Most patients will experience some swelling and bruising on the chest and neck area.  Ice packs will help.  Swelling and bruising can take several days to resolve.   It is common to experience some constipation after surgery.  Increasing fluid intake and taking a stool softener (Colace) will usually help or prevent this problem.  A mild laxative (Milk of Magnesia or Miralax) should be taken according to package directions if there has been no bowel movement after 48 hours.  You have steri-strips and a gauze dressing over your incision.  You may remove the gauze bandage on the second day after surgery, and you may shower at that time.  Leave your steri-strips (small skin tapes) in place directly over the incision.  These strips should remain on the skin for 5-7 days and then be removed.  You may get them wet in the shower and pat them dry.  You may resume regular (light) daily activities beginning the next day (such as daily self-care, walking, climbing stairs) gradually increasing activities as tolerated.  You may have sexual intercourse when it is comfortable.  Refrain from  any heavy lifting or straining until approved by your doctor.  You may drive when you no longer are taking prescription pain medication, you can comfortably wear a seatbelt, and you can safely maneuver your car and apply brakes.  You should see your doctor in the office for a follow-up appointment approximately three weeks after your surgery.  Make sure that you call for this appointment within a day or two after you arrive home to insure a convenient appointment time.  WHEN TO CALL YOUR DOCTOR: -- Fever greater than 101.5 -- Inability to urinate -- Nausea and/or vomiting - persistent -- Extreme swelling or bruising -- Continued bleeding from incision -- Increased pain, redness, or drainage from the incision -- Difficulty swallowing or breathing -- Muscle cramping or spasms -- Numbness or tingling in hands or around lips  The clinic staff is available to answer your questions during regular business hours.  Please don't hesitate to call and ask to speak to one of the nurses if you have concerns.  Fadia Marlar, MD [facility] Office: [phone] 

## 2020-07-28 NOTE — Anesthesia Preprocedure Evaluation (Signed)
Anesthesia Evaluation  Patient identified by MRN, date of birth, ID band Patient awake    Reviewed: Allergy & Precautions, NPO status , Patient's Chart, lab work & pertinent test results  Airway Mallampati: II  TM Distance: >3 FB Neck ROM: Full    Dental no notable dental hx.    Pulmonary neg pulmonary ROS,    Pulmonary exam normal breath sounds clear to auscultation       Cardiovascular hypertension, Pt. on medications + CAD, + Past MI and + Cardiac Stents  Normal cardiovascular exam Rhythm:Regular Rate:Normal     Neuro/Psych negative neurological ROS  negative psych ROS   GI/Hepatic negative GI ROS, Neg liver ROS,   Endo/Other  negative endocrine ROSdiabetes, Type 2  Renal/GU Renal InsufficiencyRenal disease  negative genitourinary   Musculoskeletal  (+) Arthritis , Osteoarthritis,    Abdominal   Peds negative pediatric ROS (+)  Hematology negative hematology ROS (+)   Anesthesia Other Findings   Reproductive/Obstetrics negative OB ROS                             Anesthesia Physical Anesthesia Plan  ASA: III  Anesthesia Plan: General   Post-op Pain Management:    Induction: Intravenous  PONV Risk Score and Plan: 3 and Ondansetron, Dexamethasone, Midazolam and Treatment may vary due to age or medical condition  Airway Management Planned: Oral ETT  Additional Equipment:   Intra-op Plan:   Post-operative Plan: Extubation in OR  Informed Consent: I have reviewed the patients History and Physical, chart, labs and discussed the procedure including the risks, benefits and alternatives for the proposed anesthesia with the patient or authorized representative who has indicated his/her understanding and acceptance.     Dental advisory given  Plan Discussed with: CRNA  Anesthesia Plan Comments:         Anesthesia Quick Evaluation

## 2020-07-28 NOTE — Progress Notes (Signed)
Attempt to contact [facility] to obtain further admission data unsuccessful.

## 2020-07-28 NOTE — Progress Notes (Signed)
Pharmacy Note: Regarding BP meds  After speaking with [facility], contacted [facility] regarding patient's BP meds. Per their records, patient did fill most recent script for Coreg 6.25 mg BID in April, but has not picked up Toprol or Norvasc since February 2022.  Had discussed meds with Dr. Harlow Asa prior to receiving the above info from [facility], with plan to continue what patient had been taking at home. Per that discussion, will d/c orders for Toprol as patient was no longer taking and was not supposed to be taking (See note from Barbarann Ehlers earlier today). Will allow amlodipine to continue as patient is not currently taking, but was still supposed to be taking, and BP running high.   Please contact [place] with any further inquiries.  Reuel Boom, PharmD, BCPS [phone] 07/28/2020, 6:44 PM

## 2020-07-28 NOTE — Op Note (Addendum)
Procedure Note  Pre-operative Diagnosis:  Multinodular thyroid goiter, hyperthyroidism   Post-operative Diagnosis:  same  Surgeon:  Armandina Gemma, MD  Assistant:  Romana Juniper, MD   Procedure:  Total thyroidectomy  Anesthesia:  General  Estimated Blood Loss:  minimal  Drains: none         Specimen: thyroid to pathology  Indications:  Patient is referred by Dr. Aviva Signs in [place] for surgical evaluation and management of multinodular thyroid goiter with hyperthyroidism. Patient's endocrinologist is Dr. Bud Face. Patient was evaluated in August 2019 for possible thyroidectomy. However, due to cardiac issues, surgery was postponed for at least a year while the patient was anticoagulated. Patient has seen her cardiologist in follow-up, Dr. Rozann Lesches, and has been released to proceed with thyroid surgery. Anticoagulation has been discontinued. Patient was seen and evaluated in October 2021 in preparation for thyroid surgery. Unfortunately she developed bronchopneumonia and required hospitalization and her procedure was postponed. She has recovered nicely. She has been released by her medical team to proceed with thyroid surgery. She presents today accompanied by her daughter. They feel that the thyroid is become even larger in the interim. They would like to proceed with thyroidectomy in the near future.  Procedure Details: Procedure was done in OR #1 at the [facility]. The patient was brought to the operating room and placed in a supine position on the operating room table. Following administration of general anesthesia, the patient was positioned and then prepped and draped in the usual aseptic fashion. After ascertaining that an adequate level of anesthesia had been achieved, a small Kocher incision was made with #15 blade. Dissection was carried through subcutaneous tissues and platysma.Hemostasis was achieved with the electrocautery. Skin flaps were elevated  cephalad and caudad from the thyroid notch to the sternal notch. A Mahorner self-retaining retractor was placed for exposure. Strap muscles were incised in the midline and dissection was begun on the left side.  Strap muscles were reflected laterally.  Left thyroid lobe was massively enlarged with multiple nodules.  The size of the lobe deviated the trachea to the right of midline. The left lobe was gently mobilized with blunt dissection. Superior pole vessels were dissected out and divided individually between small and medium ligaclips with the harmonic scalpel. The thyroid lobe was rolled anteriorly. Branches of the inferior thyroid artery were divided between small ligaclips with the harmonic scalpel. Inferior venous tributaries were divided between ligaclips. The recurrent laryngeal nerve was identified and preserved along its course. The ligament of Gwenlyn Found was released with the electrocautery and the gland was mobilized onto the anterior trachea. Isthmus was mobilized across the midline. There was a small pyramidal lobe present which was resected with the isthmus. Dry pack was placed in the left neck.  The right thyroid lobe was gently mobilized with blunt dissection. Right thyroid lobe was markedly enlarged as well and extended posterior to the airway with a very high superior pole. Superior pole vessels were dissected out and divided between small and medium ligaclips with the Harmonic scalpel. Superior parathyroid was identified and preserved. Inferior venous tributaries were divided between medium ligaclips with the harmonic scalpel. The right thyroid lobe was rolled anteriorly and the branches of the inferior thyroid artery divided between small ligaclips. The right recurrent laryngeal nerve was identified and preserved along its course. The ligament of Gwenlyn Found was released with the electrocautery. The right thyroid lobe was mobilized onto the anterior trachea and the remainder of the thyroid was dissected  off the anterior trachea and the thyroid was completely excised. A suture was used to mark the left lobe. The entire thyroid gland was submitted to pathology for review.  The neck was irrigated with warm saline. Fibrillar was placed throughout the operative field. Strap muscles were approximated in the midline with interrupted 3-0 Vicryl sutures. Platysma was closed with interrupted 3-0 Vicryl sutures. Skin was closed with a running 4-0 Monocryl subcuticular suture. Wound was washed and Dermabond was applied. The patient was awakened from anesthesia and brought to the recovery room. The patient tolerated the procedure well.   Armandina Gemma, MD [facility] Office: [phone]

## 2020-07-28 NOTE — Interval H&P Note (Signed)
History and Physical Interval Note:  07/28/2020 9:37 AM  Erin Jordan  has presented today for surgery, with the diagnosis of multiple thyroid goiter.  The various methods of treatment have been discussed with the patient and family. After consideration of risks, benefits and other options for treatment, the patient has consented to    Procedure(s) with comments: TOTAL THYROIDECTOMY (N/A) - 2 HOURS as a surgical intervention.    The patient's history has been reviewed, patient examined, no change in status, stable for surgery.  I have reviewed the patient's chart and labs.  Questions were answered to the patient's satisfaction.    Armandina Gemma, MD [facility] Office: [place]

## 2020-07-28 NOTE — Progress Notes (Signed)
Unable to obtain admission question answers due to patient being a poor historian. Patient unable to follow commands to properly use incentive spirometer.

## 2020-07-29 ENCOUNTER — Encounter (HOSPITAL_COMMUNITY): Payer: Self-pay | Admitting: Surgery

## 2020-07-29 DIAGNOSIS — E0520 Thyrotoxicosis with toxic multinodular goiter without thyrotoxic crisis or storm: Secondary | ICD-10-CM | POA: Diagnosis not present

## 2020-07-29 LAB — BASIC METABOLIC PANEL
Anion gap: 13 (ref 5–15)
BUN: 27 mg/dL — ABNORMAL HIGH (ref 8–23)
CO2: 21 mmol/L — ABNORMAL LOW (ref 22–32)
Calcium: 9.6 mg/dL (ref 8.9–10.3)
Chloride: 100 mmol/L (ref 98–111)
Creatinine, Ser: 1.62 mg/dL — ABNORMAL HIGH (ref 0.44–1.00)
GFR, Estimated: 33 mL/min — ABNORMAL LOW (ref >60)
Glucose, Bld: 150 mg/dL — ABNORMAL HIGH (ref 70–99)
Potassium: 4.3 mmol/L (ref 3.5–5.1)
Sodium: 134 mmol/L — ABNORMAL LOW (ref 135–145)

## 2020-07-29 NOTE — Discharge Summary (Signed)
Physician Discharge Summary  Patient ID: Erin Jordan MRN: [id number] DOB/AGE: [birth year] 77 y.o.  Admit date: 07/28/2020 Discharge date: 07/29/2020  Admission Diagnoses:  Discharge Diagnoses:  Principal Problem:   Enlarged thyroid Active Problems:   Multiple thyroid nodules   Hyperthyroidism   Toxic multinodular goiter   Discharged Condition: good  Hospital Course: uneventful post op recovery.  Discharged home POD#1    Significant Diagnostic Studies:   Treatments: surgery: total thyroidectomy  Discharge Exam: Blood pressure (!) 149/74, pulse 84, temperature 98.8 F (37.1 C), temperature source Oral, resp. rate 16, height 5\' 1"  (1.549 m), weight 49.1 kg, SpO2 100 %. General appearance: alert, cooperative and no distress Resp: clear to auscultation bilaterally Cardio: regular rate and rhythm, S1, S2 normal, no murmur, click, rub or gallop Incision/Wound:neck incision healing well.   No hematoma, voice normal  Disposition: Discharge disposition: 01-Home or Self Care        Allergies as of 07/29/2020   No Known Allergies     Medication List    TAKE these medications   acetaminophen 325 MG tablet Commonly known as: TYLENOL Take 2 tablets (650 mg total) by mouth every 6 (six) hours as needed for mild pain (or Fever >/= 101).   albuterol 108 (90 Base) MCG/ACT inhaler Commonly known as: VENTOLIN HFA Inhale 2 puffs into the lungs every 6 (six) hours as needed for wheezing or shortness of breath.   amLODipine 10 MG tablet Commonly known as: NORVASC Take 1 tablet (10 mg total) by mouth daily.   atorvastatin 80 MG tablet Commonly known as: LIPITOR Take 1 tablet (80 mg total) by mouth daily.   calcium carbonate 500 MG chewable tablet Commonly known as: Tums Chew 2 tablets (400 mg of elemental calcium total) by mouth 3 (three) times daily.   carvedilol 12.5 MG tablet Commonly known as: COREG Take 1 tablet (12.5 mg total) by mouth 2 (two) times daily. For  BP and Heart   cholecalciferol 25 MCG (1000 UNIT) tablet Commonly known as: VITAMIN D Take 1,000 Units by mouth daily.   donepezil 10 MG tablet Commonly known as: ARICEPT Take 1 tablet (10 mg total) by mouth at bedtime.   levothyroxine 88 MCG tablet Commonly known as: Synthroid Take 1 tablet (88 mcg total) by mouth daily before breakfast.   metoprolol succinate 25 MG 24 hr tablet Commonly known as: TOPROL-XL Take 37.5 mg by mouth daily.   nitroGLYCERIN 0.4 MG SL tablet Commonly known as: NITROSTAT Place 1 tablet (0.4 mg total) under the tongue every 5 (five) minutes x 3 doses as needed for chest pain.   traMADol 50 MG tablet Commonly known as: ULTRAM Take 1-2 tablets (50-100 mg total) by mouth every 6 (six) hours as needed for moderate pain.   traZODone 50 MG tablet Commonly known as: DESYREL Take 1 tablet (50 mg total) by mouth at bedtime.       Follow-up Information    Armandina Gemma, MD. Schedule an appointment as soon as possible for a visit in 3 weeks.   Specialty: General Surgery Why: For wound re-check Contact information: [address] [place] [address] [phone]               Signed: Coralie Keens 07/29/2020, 9:16 AM

## 2020-07-29 NOTE — Progress Notes (Signed)
Nurse reviewed discharge discharge instructions with pt's son.  Pt's son verbalized understanding of discharge instructions, follow up appointments and new medications.  No concerns at time of discharge.

## 2020-07-29 NOTE — Progress Notes (Signed)
Patient ID: Erin Jordan, female   DOB: 1945-10-17, 75 y.o.   MRN: [id number]  Baseline dementia No issues overnight Neck incision clean without swelling or hematoma Calcium normal   Plan: Discharge home

## 2020-07-31 LAB — SURGICAL PATHOLOGY

## 2020-07-31 NOTE — Progress Notes (Signed)
Please contact patient and notify of benign pathology results.  Jazelyn Sipe M. Graceanna Theissen, MD, FACS [facility] Office: [phone]   

## 2020-08-08 ENCOUNTER — Encounter: Payer: Self-pay | Admitting: Family Medicine

## 2020-08-08 ENCOUNTER — Ambulatory Visit: Payer: Medicare HMO | Admitting: Family Medicine

## 2020-08-18 ENCOUNTER — Encounter: Payer: Self-pay | Admitting: Family Medicine

## 2020-08-18 ENCOUNTER — Other Ambulatory Visit: Payer: Self-pay

## 2020-08-18 ENCOUNTER — Ambulatory Visit (INDEPENDENT_AMBULATORY_CARE_PROVIDER_SITE_OTHER): Payer: Medicare HMO | Admitting: Family Medicine

## 2020-08-18 VITALS — BP 126/71 | HR 79 | Temp 98.5°F | Ht 61.0 in | Wt 117.0 lb

## 2020-08-18 DIAGNOSIS — E890 Postprocedural hypothyroidism: Secondary | ICD-10-CM | POA: Diagnosis not present

## 2020-08-18 DIAGNOSIS — I2510 Atherosclerotic heart disease of native coronary artery without angina pectoris: Secondary | ICD-10-CM | POA: Diagnosis not present

## 2020-08-18 DIAGNOSIS — Z9861 Coronary angioplasty status: Secondary | ICD-10-CM | POA: Diagnosis not present

## 2020-08-18 NOTE — Progress Notes (Signed)
Assessment & Plan:  1. History of total thyroidectomy Patient is doing well postop.  Discussed her thyroid levels need to be checked 6 to 8 weeks after her thyroidectomy with either Dr. Dorris Fetch or myself.  2. 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   Follow up plan: Return in about 4 weeks (around 09/15/2020) for follow-up of chronic medication conditions.  Hendricks Limes, MSN, APRN, FNP-C [facility]  Subjective:   Patient ID: Erin Jordan, female    DOB: 1945-06-20, 75 y.o.   MRN: [id number]  HPI: Erin Jordan is a 75 y.o. female presenting on 08/18/2020 for Hospitalization Follow-up (Enlarged thyroid 07/28/20 [facility])  Patient is accompanied by her granddaughter who she is okay with being present.  Patient was admitted to [facility] on 07/28/2020 for a total thyroidectomy.  She was discharged home on postop day 1.  She was advised to follow-up with Dr. Harlow Asa in 3 weeks for a wound recheck, which she did earlier this week.  Granddaughter reports that Dr. Harlow Asa would like patient to follow back up with Dr. Dorris Fetch, endocrinologist.  She is taking her levothyroxine first thing in the morning with a full glass of water.     ROS: Negative unless specifically indicated above in HPI.   Relevant past medical history reviewed and updated as indicated.   Allergies and medications reviewed and updated.   Current Outpatient Medications:  .  acetaminophen (TYLENOL) 325 MG tablet, Take 2 tablets (650 mg total) by mouth every 6 (six) hours as needed for mild pain (or Fever >/= 101)., Disp: 20 tablet, Rfl: 0 .  albuterol (VENTOLIN HFA) 108 (90 Base) MCG/ACT inhaler, Inhale 2 puffs into the lungs every 6 (six) hours as needed for wheezing or shortness of breath., Disp: 18 g, Rfl: 2 .  amLODipine (NORVASC) 10 MG tablet, Take 1 tablet (10 mg total) by mouth daily., Disp: 30 tablet, Rfl: 5 .   atorvastatin (LIPITOR) 80 MG tablet, Take 1 tablet (80 mg total) by mouth daily., Disp: 90 tablet, Rfl: 1 .  calcium carbonate (TUMS) 500 MG chewable tablet, Chew 2 tablets (400 mg of elemental calcium total) by mouth 3 (three) times daily., Disp: 90 tablet, Rfl: 1 .  carvedilol (COREG) 12.5 MG tablet, Take 1 tablet (12.5 mg total) by mouth 2 (two) times daily. For BP and Heart, Disp: 60 tablet, Rfl: 5 .  cholecalciferol (VITAMIN D) 25 MCG (1000 UT) tablet, Take 1,000 Units by mouth daily. , Disp: , Rfl:  .  donepezil (ARICEPT) 10 MG tablet, Take 1 tablet (10 mg total) by mouth at bedtime., Disp: 90 tablet, Rfl: 1 .  levothyroxine (SYNTHROID) 88 MCG tablet, Take 1 tablet (88 mcg total) by mouth daily before breakfast., Disp: 30 tablet, Rfl: 2 .  metoprolol succinate (TOPROL-XL) 25 MG 24 hr tablet, Take 37.5 mg by mouth daily., Disp: , Rfl:  .  nitroGLYCERIN (NITROSTAT) 0.4 MG SL tablet, Place 1 tablet (0.4 mg total) under the tongue every 5 (five) minutes x 3 doses as needed for chest pain., Disp: 25 tablet, Rfl: 3 .  traMADol (ULTRAM) 50 MG tablet, Take 1-2 tablets (50-100 mg total) by mouth every 6 (six) hours as needed for moderate pain., Disp: 15 tablet, Rfl: 0 .  traZODone (DESYREL) 50 MG tablet, Take 1 tablet (50 mg total) by mouth at bedtime., Disp: 30 tablet, Rfl: 5  No Known  Allergies  Objective:   BP 126/71   Pulse 79   Temp 98.5 F (36.9 C) (Temporal)   Ht 5\' 1"  (1.549 m)   Wt 117 lb (53.1 kg)   SpO2 100%   BMI 22.11 kg/m    Physical Exam Vitals reviewed.  Constitutional:      General: She is not in acute distress.    Appearance: Normal appearance. She is not ill-appearing, toxic-appearing or diaphoretic.  HENT:     Head: Normocephalic and atraumatic.  Eyes:     General: No scleral icterus.       Right eye: No discharge.        Left eye: No discharge.     Conjunctiva/sclera: Conjunctivae normal.  Neck:      Comments: Scar from recent surgery well approximated without  erythema, warmth, or drainage. Cardiovascular:     Rate and Rhythm: Normal rate and regular rhythm.     Heart sounds: Normal heart sounds. No murmur heard. No friction rub. No gallop.   Pulmonary:     Effort: Pulmonary effort is normal. No respiratory distress.     Breath sounds: Normal breath sounds. No stridor. No wheezing, rhonchi or rales.  Musculoskeletal:        General: Normal range of motion.     Cervical back: Normal range of motion.  Skin:    General: Skin is warm and dry.     Capillary Refill: Capillary refill takes less than 2 seconds.  Neurological:     General: No focal deficit present.     Mental Status: She is alert and oriented to person, place, and time. Mental status is at baseline.  Psychiatric:        Mood and Affect: Mood normal.        Behavior: Behavior normal.        Thought Content: Thought content normal.        Judgment: Judgment normal.

## 2020-08-21 ENCOUNTER — Other Ambulatory Visit: Payer: Self-pay | Admitting: "Endocrinology

## 2020-08-21 ENCOUNTER — Telehealth: Payer: Self-pay

## 2020-08-21 DIAGNOSIS — E890 Postprocedural hypothyroidism: Secondary | ICD-10-CM

## 2020-08-21 NOTE — Telephone Encounter (Signed)
Her last visit here was in 2019. She needs TSH, Free T4 and visit after her labs.

## 2020-08-21 NOTE — Telephone Encounter (Signed)
Pt had her surgery on 4/22. She said her PCP is wanting her to be seen soon. Last office visit said follow up in 8 weeks after surgery. Do you want her to wait that long or go ahead and get her in? Her PCP said that she needed to be started on some medication and she wanted to see her back next week. Please advise    Call back at [phone]//[phone]

## 2020-08-21 NOTE — Telephone Encounter (Signed)
Pt will complete blood work and be seen this thursday

## 2020-08-23 LAB — T4, FREE: Free T4: 1.19 ng/dL (ref 0.82–1.77)

## 2020-08-23 LAB — TSH: TSH: 29.5 u[IU]/mL — ABNORMAL HIGH (ref 0.450–4.500)

## 2020-08-24 ENCOUNTER — Other Ambulatory Visit: Payer: Self-pay | Admitting: Family Medicine

## 2020-08-24 ENCOUNTER — Encounter: Payer: Self-pay | Admitting: "Endocrinology

## 2020-08-24 ENCOUNTER — Ambulatory Visit (INDEPENDENT_AMBULATORY_CARE_PROVIDER_SITE_OTHER): Payer: Medicare HMO | Admitting: "Endocrinology

## 2020-08-24 ENCOUNTER — Other Ambulatory Visit: Payer: Self-pay

## 2020-08-24 VITALS — BP 145/61 | HR 77 | Ht 61.0 in | Wt 119.0 lb

## 2020-08-24 DIAGNOSIS — F0390 Unspecified dementia without behavioral disturbance: Secondary | ICD-10-CM

## 2020-08-24 DIAGNOSIS — E785 Hyperlipidemia, unspecified: Secondary | ICD-10-CM

## 2020-08-24 DIAGNOSIS — E890 Postprocedural hypothyroidism: Secondary | ICD-10-CM | POA: Diagnosis not present

## 2020-08-24 MED ORDER — LEVOTHYROXINE SODIUM 100 MCG PO TABS: 100.0000 ug | ORAL_TABLET | Freq: Every day | ORAL | 0 refills | Status: DC

## 2020-08-24 NOTE — Progress Notes (Signed)
08/24/2020, 10:04 PM 08/24/2020   Subjective:    Patient ID: Erin Jordan, female    DOB: 02/28/1946, PCP [name], FNP   Past Medical History:  Diagnosis Date  . Arthritis   . Cancer of kidney ([place])   . CKD (chronic kidney disease), stage IV ([place])   . Coronary atherosclerosis of native coronary artery 2006   Multivessel status post CABG in [place], graft disease documented March 2019 with DES to SVG to diagonal  . Diabetes mellitus without complication ([place])    hx of not currently on meds at preop visit of 07/19/20  . Essential hypertension   . 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 History of diabetes mellitus, type II   . Hyperlipidemia   . Hypothyroidism   . NSTEMI (non-ST elevated myocardial infarction) ([place]) 06/24/2017  . Pneumonia    hx of 2021   . Renal hematoma   . Right renal mass    Past Surgical History:  Procedure Laterality Date  . ABDOMINAL HYSTERECTOMY    . CORONARY ARTERY BYPASS GRAFT  2006   [place], [place]  . CORONARY STENT INTERVENTION N/A 06/25/2017   Procedure: CORONARY STENT INTERVENTION;  Surgeon: Jettie Booze, MD;  Location: [facility];  Service: Cardiovascular;  Laterality: N/A;  . IR RADIOLOGIST EVAL & MGMT  07/08/2019  . LEFT HEART CATH AND CORS/GRAFTS ANGIOGRAPHY N/A 06/25/2017   Procedure: LEFT HEART CATH AND CORS/GRAFTS ANGIOGRAPHY;  Surgeon: Jettie Booze, MD;  Location: [facility];  Service: Cardiovascular;  Laterality: N/A;  . THYROIDECTOMY N/A 07/28/2020   Procedure: TOTAL THYROIDECTOMY;  Surgeon: Armandina Gemma, MD;  Location: [facility];  Service: General;  Laterality: N/A;  2 HOURS   Social History   Socioeconomic History  . Marital status: Widowed    Spouse name: Not on file  . Number of children: 6  . Years of education: Not on file  . Highest education level: Not on file  Occupational History  . Occupation: retired  Tobacco Use   . Smoking status: Never Smoker  . Smokeless tobacco: Never Used  Vaping Use  . Vaping Use: Never used  Substance and Sexual Activity  . Alcohol use: No  . Drug use: No  . Sexual activity: Never  Other Topics Concern  . Not on file  Social History Narrative   Lives in [place] with her daughter and grandchildren.   Social Determinants of Health   Financial Resource Strain: Not on file  Food Insecurity: Not on file  Transportation Needs: Not on file  Physical Activity: Not on file  Stress: Not on file  Social Connections: Not on file   Outpatient Encounter Medications as of 08/24/2020  Medication Sig  . levothyroxine (SYNTHROID) 100 MCG tablet Take 1 tablet (100 mcg total) by mouth daily before breakfast.  . albuterol (VENTOLIN HFA) 108 (90 Base) MCG/ACT inhaler Inhale 2 puffs into the lungs every 6 (six) hours as needed for wheezing or shortness of breath.  Marland Kitchen amLODipine (NORVASC) 10 MG tablet Take 1 tablet (10 mg total) by mouth daily.  Marland Kitchen aspirin EC 81 MG tablet Take 81 mg by mouth daily. Swallow whole.  . calcium carbonate (TUMS) 500 MG  chewable tablet Chew 2 tablets (400 mg of elemental calcium total) by mouth 3 (three) times daily.  . carvedilol (COREG) 12.5 MG tablet Take 1 tablet (12.5 mg total) by mouth 2 (two) times daily. For BP and Heart  . nitroGLYCERIN (NITROSTAT) 0.4 MG SL tablet Place 1 tablet (0.4 mg total) under the tongue every 5 (five) minutes x 3 doses as needed for chest pain.  . traZODone (DESYREL) 50 MG tablet Take 1 tablet (50 mg total) by mouth at bedtime.  . [DISCONTINUED] atorvastatin (LIPITOR) 80 MG tablet Take 1 tablet (80 mg total) by mouth daily.  . [DISCONTINUED] donepezil (ARICEPT) 10 MG tablet Take 1 tablet (10 mg total) by mouth at bedtime.  . [DISCONTINUED] levothyroxine (SYNTHROID) 88 MCG tablet Take 1 tablet (88 mcg total) by mouth daily before breakfast.   No facility-administered encounter medications on file as of 08/24/2020.    ALLERGIES: No Known Allergies  VACCINATION STATUS: Immunization History  Administered Date(s) Administered  . Fluad Quad(high Dose 65+) 01/25/2019  . Influenza Split 06/03/2011  . Influenza, High Dose Seasonal PF 06/27/2017  . Pneumococcal Conjugate-13 03/26/2013  . Pneumococcal Polysaccharide-23 11/03/2007, 02/14/2015, 06/27/2017    HPI Erin Jordan is 75 y.o. female who presents today with a medical history as above. she was previously seen in consultation for large compressive multinodular goiter.  See previous visit notes.  She was referred by  [name], FNP.  -She has known about having goiter for several years.  She did not seek medical attention for it until March of 05/2017 when she was brought by EMS to ER with chest pain.  She was subsequently found to have coronary artery disease which required coronary artery bypass graft.  In the process, she was diagnosed with multinodular goiter confirmed by ultrasound on July 29, 2017. -She was also found to have elevated free T4 associated with suppressed TSH. -Due to her presentation consistent with compressive multinodular goiter, she was directly sent for thyroidectomy in July 2019.  She delayed her surgery until very recently, July 28, 2020.  Her surgical samples are negative for malignancy. She was discharged on levothyroxine 88 mcg p.o. daily before breakfast.  She reports compliance to her medication.  Her previous symptoms of neck compression have resolved.  She still has a healing wound status post thyroidectomy. -She denies family history of thyroid dysfunction nor thyroid cancer.  She denies any history of neck radiation.   Review of Systems  Constitutional: With BMI of 22.4.     Objective:    BP (!) 145/61   Pulse 77   Ht 5\' 1"  (1.549 m)   Wt 119 lb (54 kg)   BMI 22.48 kg/m   Wt Readings from Last 3 Encounters:  08/24/20 119 lb (54 kg)  08/18/20 117 lb (53.1 kg)  07/28/20 108 lb 3.2 oz (49.1 kg)     Physical Exam  Constitutional:  + appropriate weight for height, not in acute distress, normal state of mind Eyes: PERRLA, EOMI, no exophthalmos ENT: Healing, fresh post thyroidectomy surgical scar on anterior lower neck.  moist mucous membranes, + huge nodular thyromegaly, no cervical lymphadenopathy C  CMP ( most recent) CMP     Component Value Date/Time   NA 134 (L) 07/29/2020 0357   NA 144 04/19/2020 1455   K 4.3 07/29/2020 0357   CL 100 07/29/2020 0357   CO2 21 (L) 07/29/2020 0357   GLUCOSE 150 (H) 07/29/2020 0357   BUN 27 (H) 07/29/2020 6160  BUN 52 (H) 04/19/2020 1455   CREATININE 1.62 (H) 07/29/2020 0357   CALCIUM 9.6 07/29/2020 0357   PROT 6.3 (L) 03/29/2020 0532   PROT 6.5 01/21/2020 1637   ALBUMIN 3.5 03/29/2020 0532   ALBUMIN 4.2 01/21/2020 1637   AST 16 03/29/2020 0532   ALT 11 03/29/2020 0532   ALKPHOS 79 03/29/2020 0532   BILITOT 0.6 03/29/2020 0532   BILITOT 0.3 01/21/2020 1637   GFRNONAA 33 (L) 07/29/2020 0357   GFRAA 18 (L) 04/19/2020 1455     Diabetic Labs (most recent): Lab Results  Component Value Date   HGBA1C 5.4 07/19/2020   HGBA1C 6.1 01/21/2020   HGBA1C 6.2 (H) 08/03/2019     Lipid Panel ( most recent) Lipid Panel     Component Value Date/Time   CHOL 177 01/21/2020 1637   TRIG 113 01/21/2020 1637   HDL 66 01/21/2020 1637   CHOLHDL 2.7 01/21/2020 1637   CHOLHDL 4.4 06/02/2011 0431   VLDL 18 06/02/2011 0431   LDLCALC 91 01/21/2020 1637    Total thyroidectomy July 28, 2020.  Final microscopic diagnosis: Thyroid total thyroidectomy: Benign nodular hyperplasia.  Recent Results (from the past 2160 hour(s))  Urinalysis, Routine w reflex microscopic     Status: Abnormal   Collection Time: 06/21/20  1:51 PM  Result Value Ref Range   Specific Gravity, UA 1.010 1.005 - 1.030   pH, UA 6.5 5.0 - 7.5   Color, UA Yellow Yellow   Appearance Ur Clear Clear   Leukocytes,UA Negative Negative   Protein,UA Negative Negative/Trace    Glucose, UA Negative Negative   Ketones, UA 1+ (A) Negative   RBC, UA Negative Negative   Bilirubin, UA Negative Negative   Urobilinogen, Ur 0.2 0.2 - 1.0 mg/dL   Nitrite, UA Negative Negative   Microscopic Examination Comment     Comment: Microscopic follows if indicated.  Basic metabolic panel per protocol     Status: Abnormal   Collection Time: 07/19/20  2:35 PM  Result Value Ref Range   Sodium 141 135 - 145 mmol/L   Potassium 4.5 3.5 - 5.1 mmol/L   Chloride 105 98 - 111 mmol/L   CO2 27 22 - 32 mmol/L   Glucose, Bld 173 (H) 70 - 99 mg/dL    Comment: Glucose reference range applies only to samples taken after fasting for at least 8 hours.   BUN 31 (H) 8 - 23 mg/dL   Creatinine, Ser 1.93 (H) 0.44 - 1.00 mg/dL   Calcium 9.2 8.9 - 10.3 mg/dL   GFR, Estimated 27 (L) >60 mL/min    Comment: (NOTE) Calculated using the CKD-EPI Creatinine Equation (2021)    Anion gap 9 5 - 15    Comment: Performed at [facility], [place] [address] [place], [place] [address]  CBC per protocol     Status: Abnormal   Collection Time: 07/19/20  2:35 PM  Result Value Ref Range   WBC 5.1 4.0 - 10.5 K/uL   RBC 3.66 (L) 3.87 - 5.11 MIL/uL   Hemoglobin 11.4 (L) 12.0 - 15.0 g/dL   HCT 34.8 (L) 36.0 - 46.0 %   MCV 95.1 80.0 - 100.0 fL   MCH 31.1 26.0 - 34.0 pg   MCHC 32.8 30.0 - 36.0 g/dL   RDW 12.8 11.5 - 15.5 %   Platelets 213 150 - 400 K/uL   nRBC 0.0 0.0 - 0.2 %    Comment: Performed at [facility], [place] [address] [place], [place] [address]  Hemoglobin A1c     Status: None   Collection Time: 07/19/20  2:35 PM  Result Value Ref Range   Hgb A1c MFr Bld 5.4 4.8 - 5.6 %    Comment: (NOTE) Pre diabetes:          5.7%-6.4%  Diabetes:              >6.4%  Glycemic control for   <7.0% adults with diabetes    Mean Plasma Glucose 108.28 mg/dL    Comment: Performed at [facility] [address], [place], [place] [address]  SARS CORONAVIRUS 2 (TAT 6-24 HRS)  Nasopharyngeal Nasopharyngeal Swab     Status: None   Collection Time: 07/25/20 10:47 AM   Specimen: Nasopharyngeal Swab  Result Value Ref Range   SARS Coronavirus 2 NEGATIVE NEGATIVE    Comment: (NOTE) SARS-CoV-2 target nucleic acids are NOT DETECTED.  The SARS-CoV-2 RNA is generally detectable in upper and lower respiratory specimens during the acute phase of infection. Negative results do not preclude SARS-CoV-2 infection, do not rule out co-infections with other pathogens, and should not be used as the sole basis for treatment or other patient management decisions. Negative results must be combined with clinical observations, patient history, and epidemiological information. The expected result is Negative.  Fact Sheet for Patients: SugarRoll.be  Fact Sheet for Healthcare Providers: https://www.woods-mathews.com/  This test is not yet approved or cleared by the [place] FDA and  has been authorized for detection and/or diagnosis of SARS-CoV-2 by FDA under an Emergency Use Authorization (EUA). This EUA will remain  in effect (meaning this test can be used) for the duration of the COVID-19 declaration under Se ction 564(b)(1) of the Act, 21 U.S.C. section 360bbb-3(b)(1), unless the authorization is terminated or revoked sooner.  Performed at [facility], [place] [address], [place], [place] [address]   Surgical pathology     Status: None   Collection Time: 07/28/20 10:30 AM  Result Value Ref Range   SURGICAL PATHOLOGY      SURGICAL PATHOLOGY CASE: [id number] PATIENT: [name] Surgical Pathology Report     Clinical History: Multiple thyroid goiter ([facility])     FINAL MICROSCOPIC DIAGNOSIS:  A. THYROID, TOTAL THYROIDECTOMY: - Benign nodular hyperplasia.   GROSS DESCRIPTION:  Specimen: Received fresh labeled thyroid Specimen integrity: The specimen is focally disrupted, with a suture designating the left  thyroid lobe. Size and weight: The right thyroid lobe measures 12.4 x 5.8 x 4.0 cm, and the left thyroid lobe measures 13.5 x 7.9 x 5.1 cm.  The specimen has a weight of 341 g External surface: Tan-red, with mild adhesions and cautery artifact on the posterior surface.  The left thyroid lobe displays 2 areas of disruption, with tan-pink nodules identified at the defects.  The intact outer surfaces are inked black. Lesion: The specimen displays numerous tan-red, gelatinous, focally calcified nodules. Uninvolved parenchyma: A small amount of red-brown uninvolved parenchyma  is identified. Block Summary: Representative sections are submitted in 8 cassettes. 1-4 = right thyroid lobe 5-8 = left thyroid lobe  Craig Staggers 07/28/2020)    Final Diagnosis performed by Vicente Males, MD.   Electronically signed 07/31/2020 Technical and / or Professional components performed at [facility], [place] [address] [place], [place] [address].  Immunohistochemistry Technical component (if applicable) was performed at [facility]. [address] [place], [place], [place] [address].   IMMUNOHISTOCHEMISTRY DISCLAIMER (if applicable): Some of these immunohistochemical  stains may have been developed and the performance characteristics determine by [facility]. Some may not have been cleared or approved by the U.S. Food and Drug Administration. The FDA has determined that such clearance or approval is not necessary. This test is used for clinical purposes. It should not be regarded as investigational or for research.  This laboratory is certified under the [place] (CLIA-88) as qualified to perform high complexity clinical laboratory testing.  The controls stained appropriately.   Glucose, capillary     Status: Abnormal   Collection Time: 07/28/20 12:40 PM  Result Value Ref Range   Glucose-Capillary 178 (H) 70 - 99 mg/dL     Comment: Glucose reference range applies only to samples taken after fasting for at least 8 hours.  Basic metabolic panel     Status: Abnormal   Collection Time: 07/29/20  3:57 AM  Result Value Ref Range   Sodium 134 (L) 135 - 145 mmol/L   Potassium 4.3 3.5 - 5.1 mmol/L   Chloride 100 98 - 111 mmol/L   CO2 21 (L) 22 - 32 mmol/L   Glucose, Bld 150 (H) 70 - 99 mg/dL    Comment: Glucose reference range applies only to samples taken after fasting for at least 8 hours.   BUN 27 (H) 8 - 23 mg/dL   Creatinine, Ser 1.62 (H) 0.44 - 1.00 mg/dL   Calcium 9.6 8.9 - 10.3 mg/dL   GFR, Estimated 33 (L) >60 mL/min    Comment: (NOTE) Calculated using the CKD-EPI Creatinine Equation (2021)    Anion gap 13 5 - 15    Comment: Performed at [facility], [place] [address] [place], [place] [address]  TSH     Status: Abnormal   Collection Time: 08/22/20  4:48 PM  Result Value Ref Range   TSH 29.500 (H) 0.450 - 4.500 uIU/mL  T4, free     Status: None   Collection Time: 08/22/20  4:48 PM  Result Value Ref Range   Free T4 1.19 0.82 - 1.77 ng/dL    Assessment & Plan:   1. Toxic multinodular goiter-status post total thyroidectomy.  Neck compression symptoms resolved.  Followed by Dr. Armandina Gemma. Surgical pathology is benign.  No further intervention needed. 2.  Postsurgical hypothyroidism Her previsit thyroid function tests are consistent with inadequate replacement.  I discussed and increase her levothyroxine to 100 mcg p.o. daily before breakfast.   - We discussed about the correct intake of her thyroid hormone, on empty stomach at fasting, with water, separated by at least 30 minutes from breakfast and other medications,  and separated by more than 4 hours from calcium, iron, multivitamins, acid reflux medications (PPIs). -Patient is made aware of the fact that thyroid hormone replacement is needed for life, dose to be adjusted by periodic monitoring of thyroid function tests.   I  spent 34 minutes in the care of the patient today including review of labs from Thyroid Function, her surgical sample cytology,  face-to-face time discussing  her lab results and symptoms, medications doses, her options of short and long term treatment based on the latest standards of care / guidelines;   and documenting the encounter.  Erin Jordan  participated in the discussions, expressed understanding, and voiced agreement with the above plans.  All questions were answered to her satisfaction. she is encouraged to contact clinic should she have any questions or concerns prior to her return visit.   -  I advised her  to maintain close follow up with [name], FNP for primary care needs.   Follow up plan: Return in about 3 months (around 11/24/2020) for F/U with Pre-visit Labs.   Glade Lloyd, MD [facility] [address] [place], [place] [address] Phone: [phone]  Fax: [phone]     08/24/2020, 10:04 PM  This note was partially dictated with voice recognition software. Similar sounding words can be transcribed inadequately or may not  be corrected upon review.

## 2020-09-02 ENCOUNTER — Other Ambulatory Visit: Payer: Self-pay

## 2020-09-02 ENCOUNTER — Emergency Department (HOSPITAL_COMMUNITY): Payer: Medicare HMO

## 2020-09-02 ENCOUNTER — Encounter (HOSPITAL_COMMUNITY): Payer: Self-pay

## 2020-09-02 ENCOUNTER — Emergency Department (HOSPITAL_COMMUNITY)
Admission: EM | Admit: 2020-09-02 | Discharge: 2020-09-02 | Disposition: A | Discharge status: Home / Self Care | Payer: Medicare HMO | Attending: Emergency Medicine | Admitting: Emergency Medicine

## 2020-09-02 DIAGNOSIS — E039 Hypothyroidism, unspecified: Secondary | ICD-10-CM | POA: Insufficient documentation

## 2020-09-02 DIAGNOSIS — R42 Dizziness and giddiness: Secondary | ICD-10-CM | POA: Diagnosis not present

## 2020-09-02 DIAGNOSIS — R55 Syncope and collapse: Secondary | ICD-10-CM

## 2020-09-02 DIAGNOSIS — Z20822 Contact with and (suspected) exposure to covid-19: Secondary | ICD-10-CM | POA: Diagnosis not present

## 2020-09-02 DIAGNOSIS — Z7982 Long term (current) use of aspirin: Secondary | ICD-10-CM | POA: Diagnosis not present

## 2020-09-02 DIAGNOSIS — Z85528 Personal history of other malignant neoplasm of kidney: Secondary | ICD-10-CM | POA: Insufficient documentation

## 2020-09-02 DIAGNOSIS — E1122 Type 2 diabetes mellitus with diabetic chronic kidney disease: Secondary | ICD-10-CM | POA: Diagnosis not present

## 2020-09-02 DIAGNOSIS — I2510 Atherosclerotic heart disease of native coronary artery without angina pectoris: Secondary | ICD-10-CM | POA: Insufficient documentation

## 2020-09-02 DIAGNOSIS — R6883 Chills (without fever): Secondary | ICD-10-CM | POA: Diagnosis not present

## 2020-09-02 DIAGNOSIS — N184 Chronic kidney disease, stage 4 (severe): Secondary | ICD-10-CM | POA: Diagnosis not present

## 2020-09-02 DIAGNOSIS — Z79899 Other long term (current) drug therapy: Secondary | ICD-10-CM | POA: Diagnosis not present

## 2020-09-02 DIAGNOSIS — K6389 Other specified diseases of intestine: Secondary | ICD-10-CM | POA: Insufficient documentation

## 2020-09-02 DIAGNOSIS — I129 Hypertensive chronic kidney disease with stage 1 through stage 4 chronic kidney disease, or unspecified chronic kidney disease: Secondary | ICD-10-CM | POA: Diagnosis not present

## 2020-09-02 DIAGNOSIS — F0390 Unspecified dementia without behavioral disturbance: Secondary | ICD-10-CM | POA: Diagnosis not present

## 2020-09-02 DIAGNOSIS — R531 Weakness: Secondary | ICD-10-CM | POA: Insufficient documentation

## 2020-09-02 DIAGNOSIS — Z951 Presence of aortocoronary bypass graft: Secondary | ICD-10-CM | POA: Insufficient documentation

## 2020-09-02 LAB — CBC WITH DIFFERENTIAL/PLATELET
Abs Immature Granulocytes: 0.04 10*3/uL (ref 0.00–0.07)
Basophils Absolute: 0 10*3/uL (ref 0.0–0.1)
Basophils Relative: 1 %
Eosinophils Absolute: 0.2 10*3/uL (ref 0.0–0.5)
Eosinophils Relative: 2 %
HCT: 36.4 % (ref 36.0–46.0)
Hemoglobin: 11.9 g/dL — ABNORMAL LOW (ref 12.0–15.0)
Immature Granulocytes: 1 %
Lymphocytes Relative: 22 %
Lymphs Abs: 1.5 10*3/uL (ref 0.7–4.0)
MCH: 31.2 pg (ref 26.0–34.0)
MCHC: 32.7 g/dL (ref 30.0–36.0)
MCV: 95.3 fL (ref 80.0–100.0)
Monocytes Absolute: 0.6 10*3/uL (ref 0.1–1.0)
Monocytes Relative: 9 %
Neutro Abs: 4.5 10*3/uL (ref 1.7–7.7)
Neutrophils Relative %: 65 %
Platelets: 188 10*3/uL (ref 150–400)
RBC: 3.82 MIL/uL — ABNORMAL LOW (ref 3.87–5.11)
RDW: 13.9 % (ref 11.5–15.5)
WBC: 6.9 10*3/uL (ref 4.0–10.5)
nRBC: 0 % (ref 0.0–0.2)

## 2020-09-02 LAB — URINALYSIS, ROUTINE W REFLEX MICROSCOPIC
Bacteria, UA: NONE SEEN
Bilirubin Urine: NEGATIVE
Glucose, UA: NEGATIVE mg/dL
Hgb urine dipstick: NEGATIVE
Ketones, ur: NEGATIVE mg/dL
Nitrite: NEGATIVE
Protein, ur: 100 mg/dL — AB
Specific Gravity, Urine: 1.006 (ref 1.005–1.030)
pH: 7 (ref 5.0–8.0)

## 2020-09-02 LAB — LIPASE, BLOOD: Lipase: 76 U/L — ABNORMAL HIGH (ref 11–51)

## 2020-09-02 LAB — BASIC METABOLIC PANEL
Anion gap: 5 (ref 5–15)
BUN: 26 mg/dL — ABNORMAL HIGH (ref 8–23)
CO2: 28 mmol/L (ref 22–32)
Calcium: 8.8 mg/dL — ABNORMAL LOW (ref 8.9–10.3)
Chloride: 104 mmol/L (ref 98–111)
Creatinine, Ser: 2.36 mg/dL — ABNORMAL HIGH (ref 0.44–1.00)
GFR, Estimated: 21 mL/min — ABNORMAL LOW (ref >60)
Glucose, Bld: 124 mg/dL — ABNORMAL HIGH (ref 70–99)
Potassium: 3.9 mmol/L (ref 3.5–5.1)
Sodium: 137 mmol/L (ref 135–145)

## 2020-09-02 LAB — RESP PANEL BY RT-PCR (FLU A&B, COVID) ARPGX2
Influenza A by PCR: NEGATIVE
Influenza B by PCR: NEGATIVE
SARS Coronavirus 2 by RT PCR: NEGATIVE

## 2020-09-02 LAB — HEPATIC FUNCTION PANEL
ALT: 17 U/L (ref 0–44)
AST: 19 U/L (ref 15–41)
Albumin: 3.8 g/dL (ref 3.5–5.0)
Alkaline Phosphatase: 100 U/L (ref 38–126)
Bilirubin, Direct: <0.1 mg/dL (ref 0.0–0.2)
Total Bilirubin: 0.7 mg/dL (ref 0.3–1.2)
Total Protein: 7 g/dL (ref 6.5–8.1)

## 2020-09-02 LAB — TROPONIN I (HIGH SENSITIVITY)
Troponin I (High Sensitivity): 8 ng/L (ref <18)
Troponin I (High Sensitivity): 8 ng/L (ref <18)

## 2020-09-02 LAB — PROTIME-INR
INR: 1.1 (ref 0.8–1.2)
Prothrombin Time: 14 seconds (ref 11.4–15.2)

## 2020-09-02 LAB — TSH: TSH: 25.022 u[IU]/mL — ABNORMAL HIGH (ref 0.350–4.500)

## 2020-09-02 LAB — T4, FREE: Free T4: 1 ng/dL (ref 0.61–1.12)

## 2020-09-02 IMAGING — CT CT ABD-PELV W/O CM
2 of 4 series · 17 of 46 positions shown, 19 images · non-contrast
Comparison: [DATE]

CLINICAL DATA: Acute nonlocalized abdominal pain

EXAM:
CT ABDOMEN AND PELVIS WITHOUT CONTRAST
TECHNIQUE: Multidetector CT imaging of the abdomen and pelvis was performed
following the standard protocol without IV contrast.

[Series 2: axial st · axial · 0.62mm/px · z∈[-397,-57]mm · 14 of 76 slices shown, 16 images]
[im 4/76  soft-tissue]
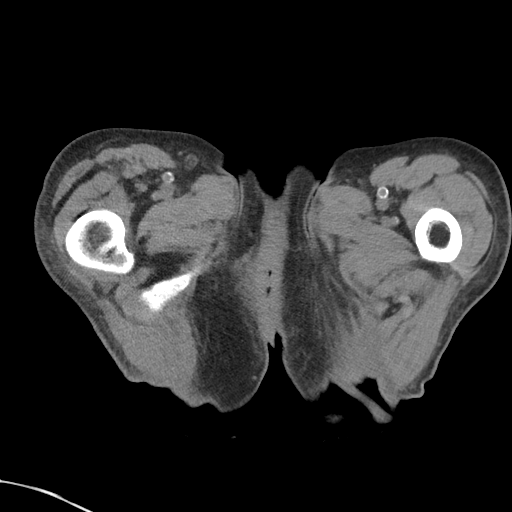
[im 4/76  bone]
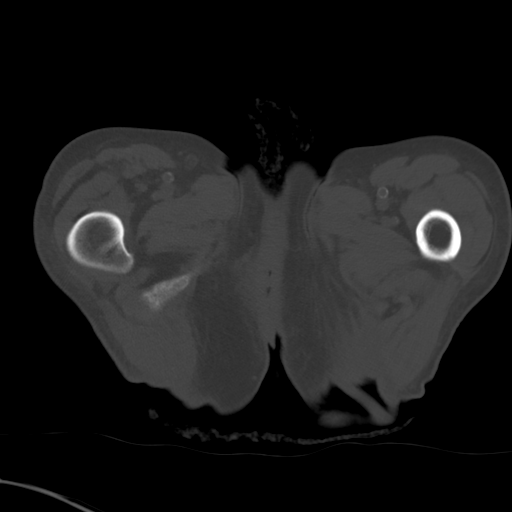
[im 12/76  soft-tissue]
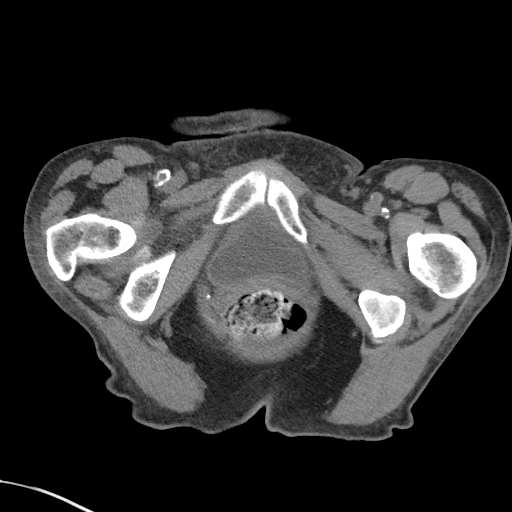
[im 16/76  soft-tissue]
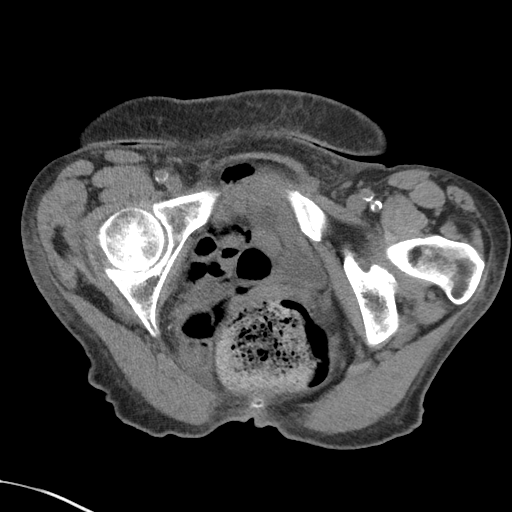
[im 19/76  soft-tissue]
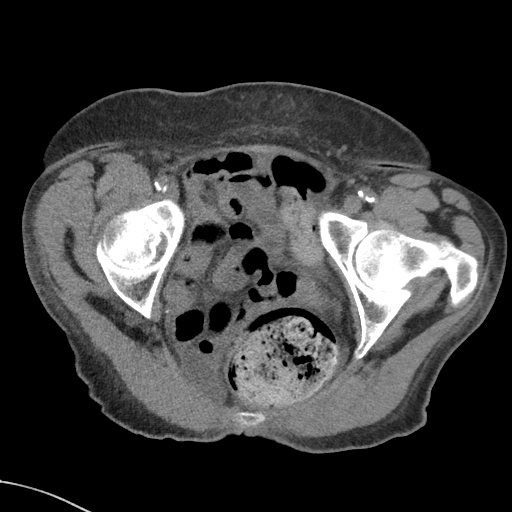
[im 27/76  soft-tissue]
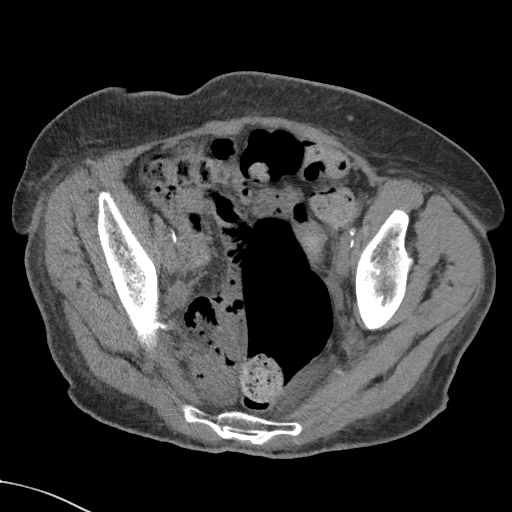
[im 31/76  soft-tissue]
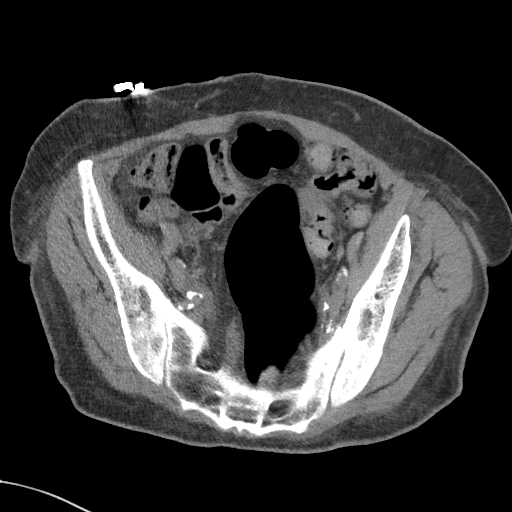
[im 34/76  soft-tissue]
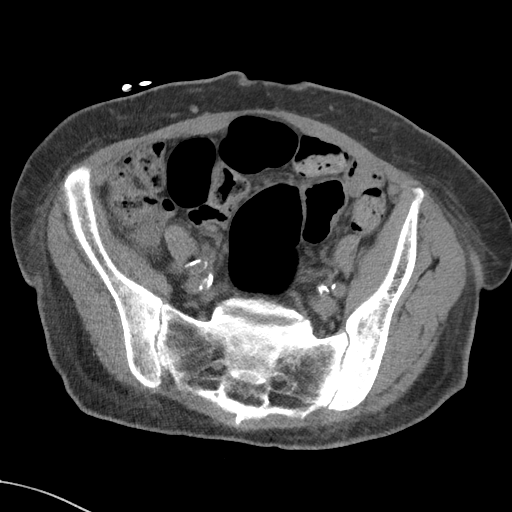
[im 42/76  soft-tissue]
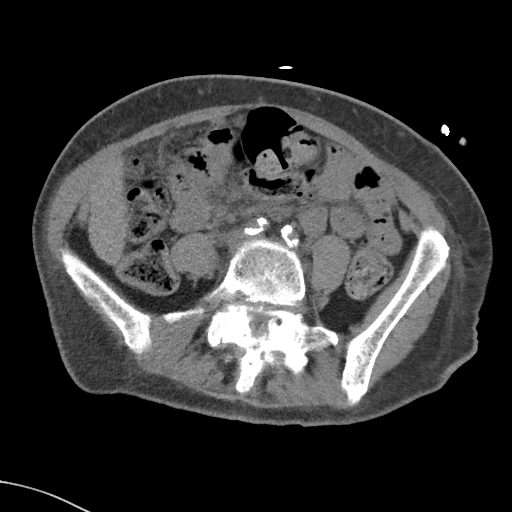
[im 46/76  soft-tissue]
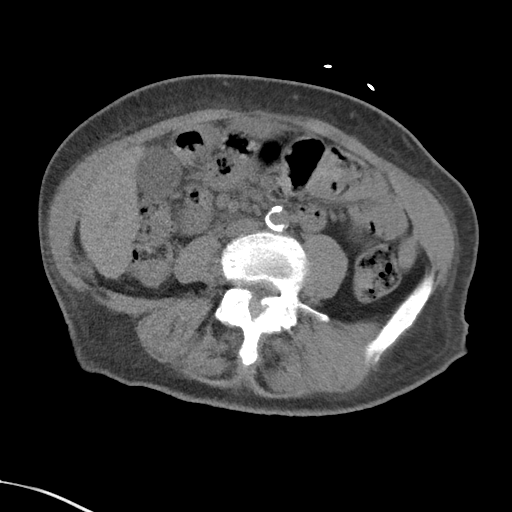
[im 46/76  bone]
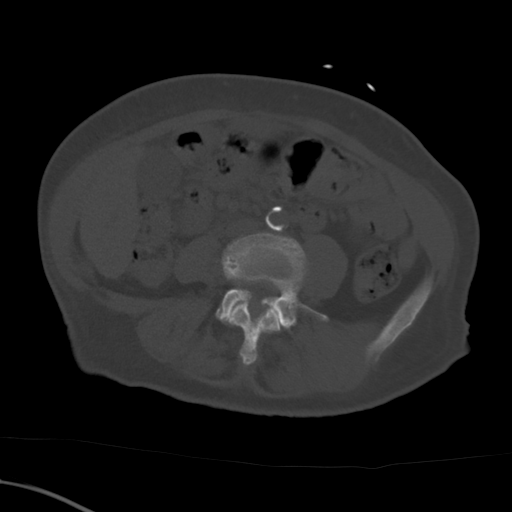
[im 49/76  soft-tissue]
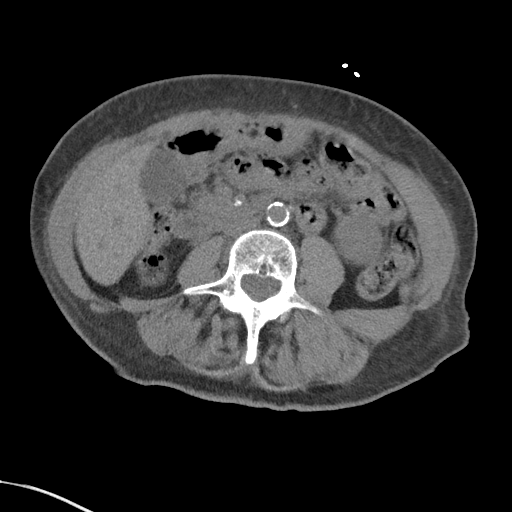
[im 57/76  soft-tissue]
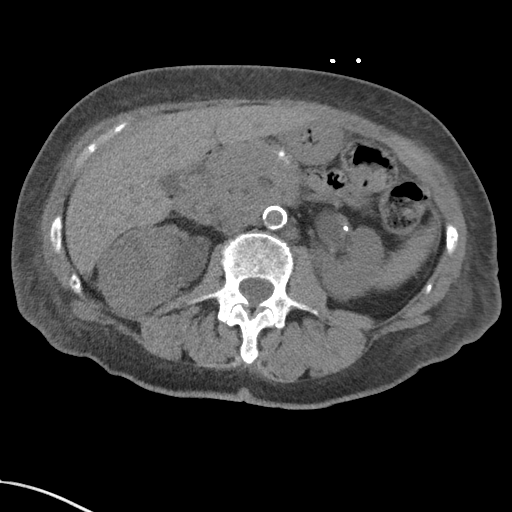
[im 61/76  soft-tissue]
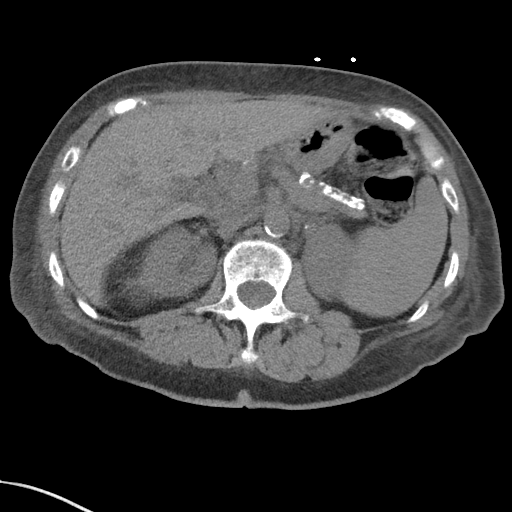
[im 64/76  soft-tissue]
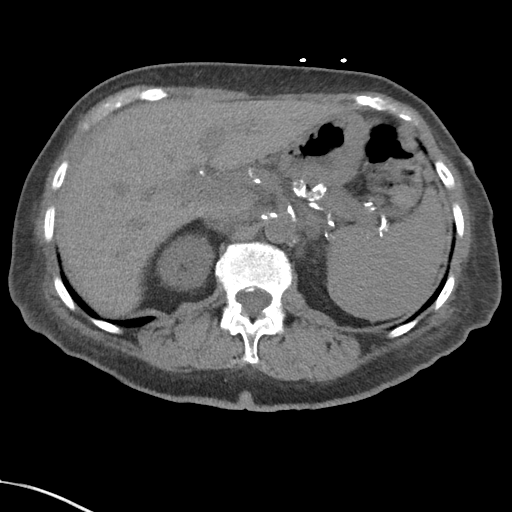
[im 72/76  soft-tissue]
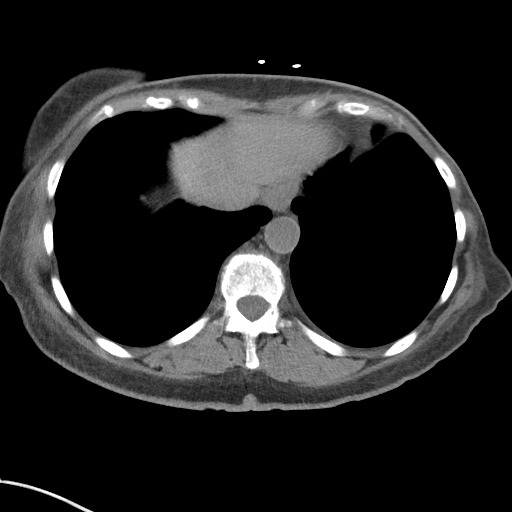

[Series 5: coronal st · coronal · 0.67mm/px · 3 of 85 slices shown]
[im 29/85  soft-tissue]
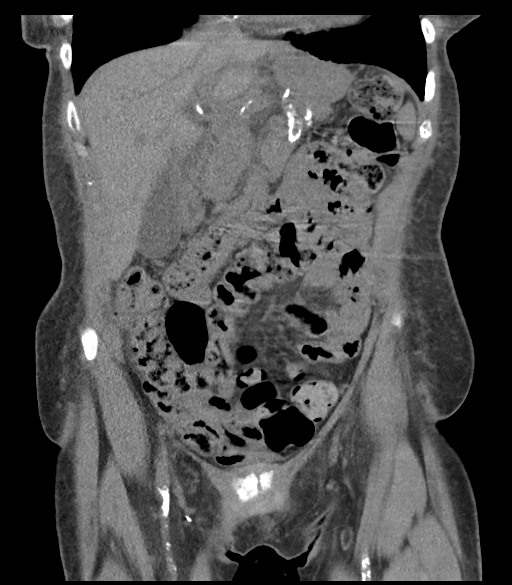
[im 38/85  soft-tissue]
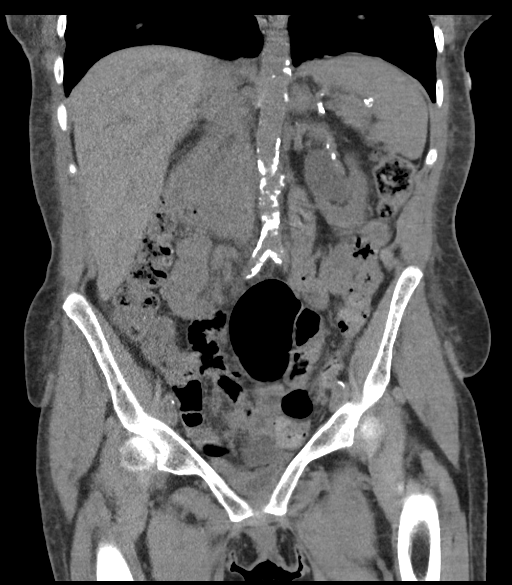
[im 47/85  soft-tissue]
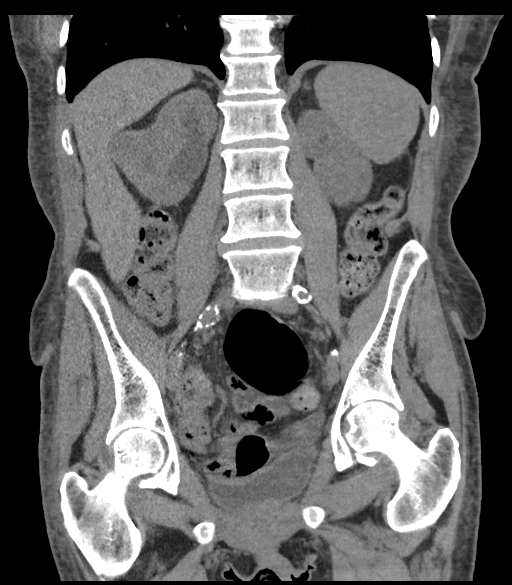

[17 of 46 positions shown; findings below may reference images not displayed]

FINDINGS: Lower chest:  Coronary atherosclerosis.

Hepatobiliary: No focal liver abnormality.No evidence of biliary
obstruction or stone.

Pancreas: Unremarkable.

Spleen: Unremarkable.

Adrenals/Urinary Tract: Negative adrenals. Solid-appearing right
renal mass measuring at least 4 cm, similar to prior based on all
projections. No hydronephrosis. Bilateral extrarenal pelvis.
Unremarkable bladder.

Stomach/Bowel:  Tortuous colon with generalized stool.

Vascular/Lymphatic: No acute vascular abnormality. Diffuse
atheromatous calcification. No mass or adenopathy.

Reproductive:Hysterectomy

Other: Small volume pelvic fluid which was also noted previously.

Musculoskeletal: No acute abnormalities. Lumbar spine degeneration
with L4/5 anterolisthesis.
IMPRESSION: 1. Generalized colonic distention by stool and gas.
2. Known right renal mass.
3.  Aortic Atherosclerosis ([UG]-[UG]).

## 2020-09-02 IMAGING — CT CT HEAD W/O CM
4 series · 16 of 47 positions shown, 18 images · non-contrast
Comparison: [DATE]

CLINICAL DATA: Nonspecific dizziness

EXAM:
CT HEAD WITHOUT CONTRAST
TECHNIQUE: Contiguous axial images were obtained from the base of the skull
through the vertex without intravenous contrast.

[Series 2: head w o · axial · 0.40mm/px · z∈[+1354,+1454]mm · 7 of 28 slices shown, 9 images]
[im 4/28  brain]
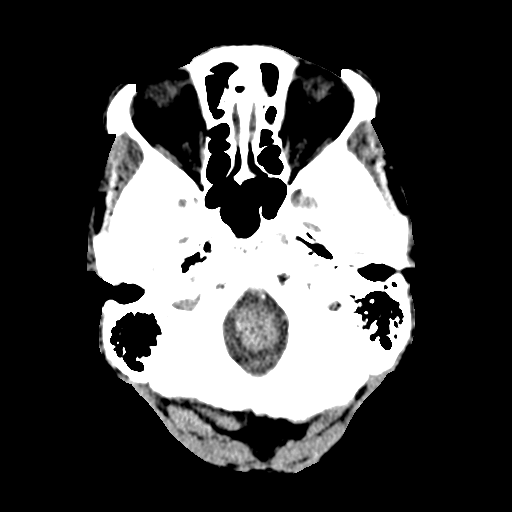
[im 4/28  bone]
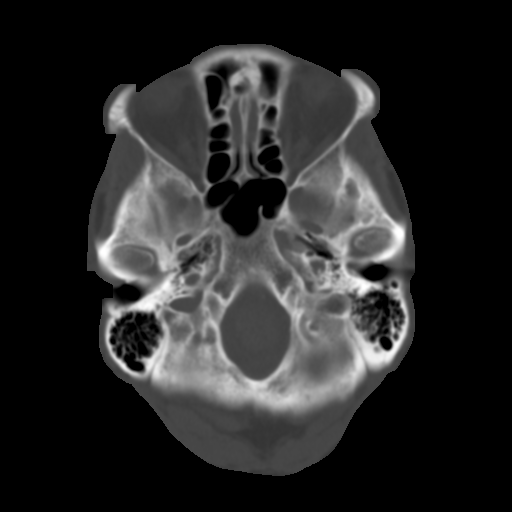
[im 7/28  brain]
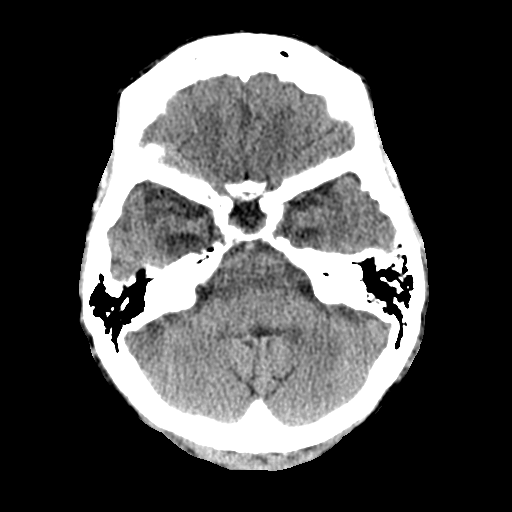
[im 11/28  brain]
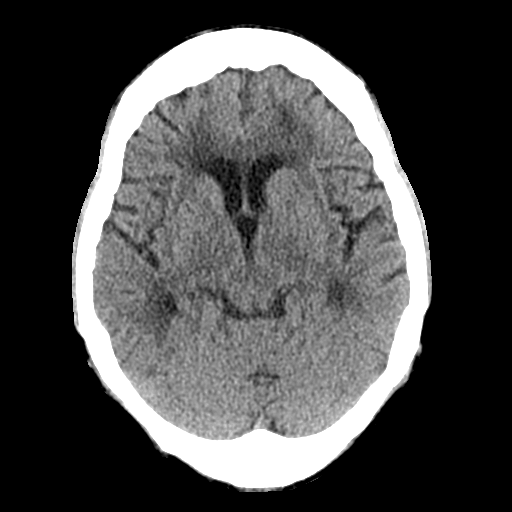
[im 14/28  brain]
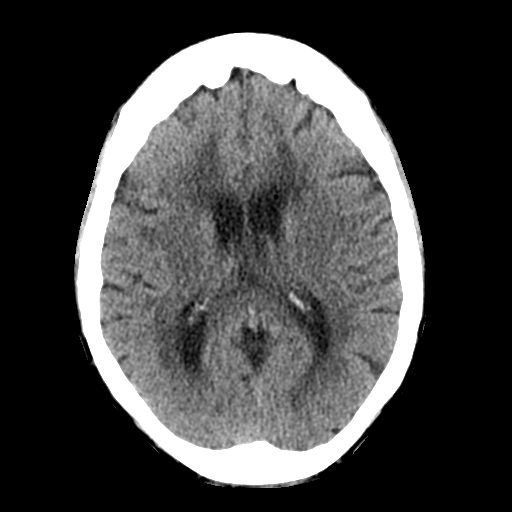
[im 17/28  brain]
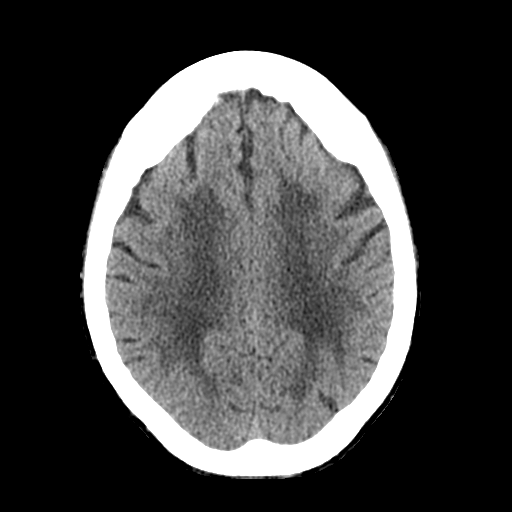
[im 17/28  bone]
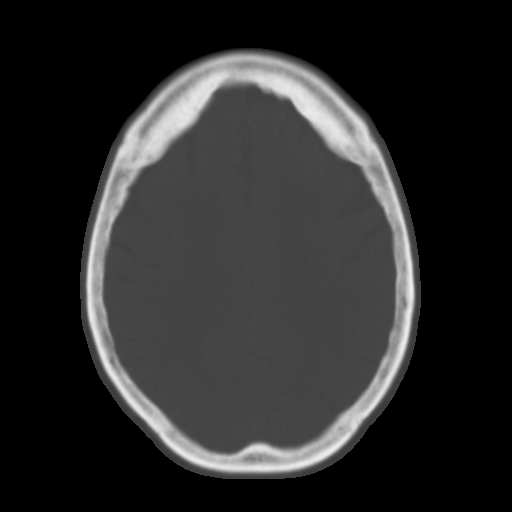
[im 21/28  brain]
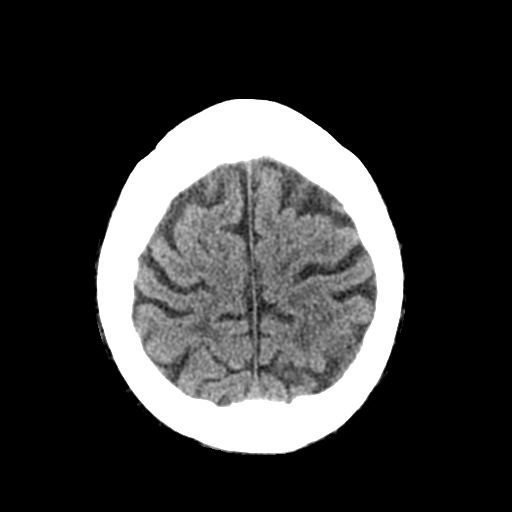
[im 24/28  brain]
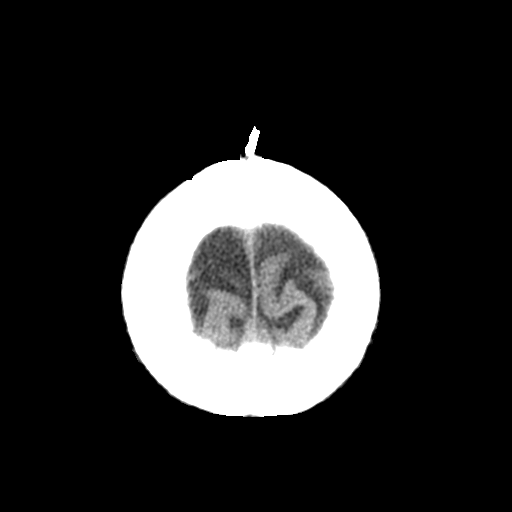

[Series 3: head bone · axial · 0.40mm/px · z∈[+1351,+1379]mm · 3 of 69 slices shown]
[im 7/69  bone]
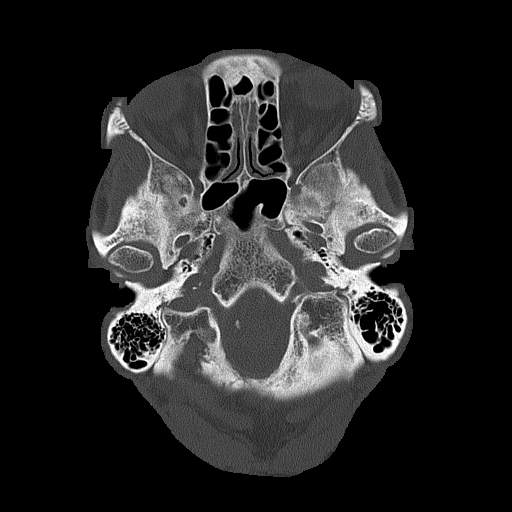
[im 14/69  bone]
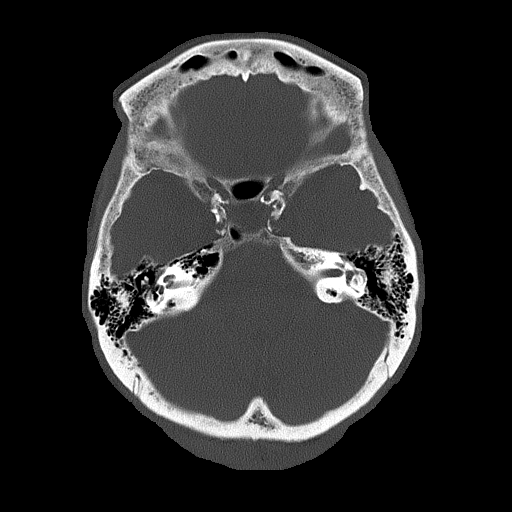
[im 21/69  bone]
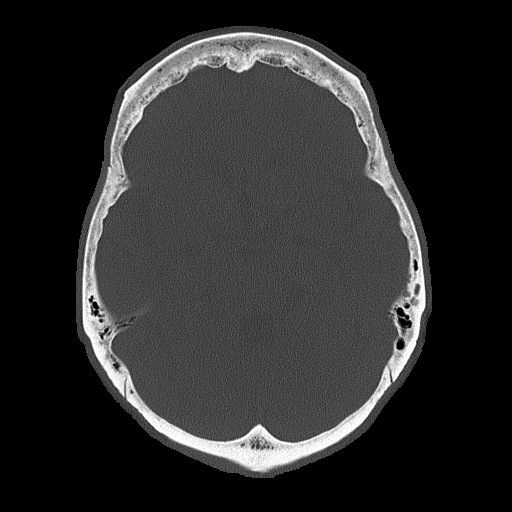

[Series 4: coronal soft · coronal · 0.31mm/px · 3 of 67 slices shown]
[im 23/67  brain]
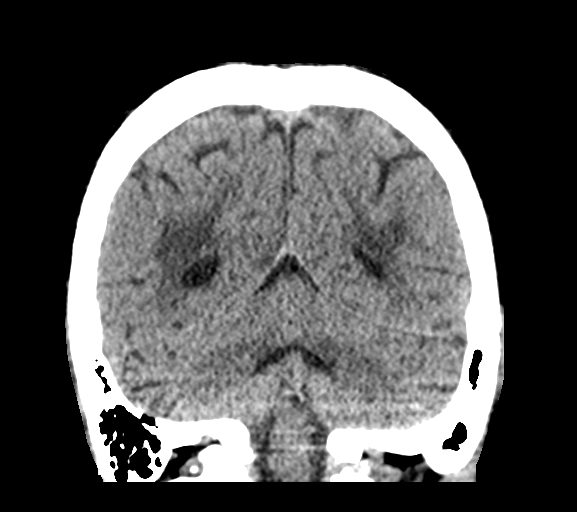
[im 30/67  brain]
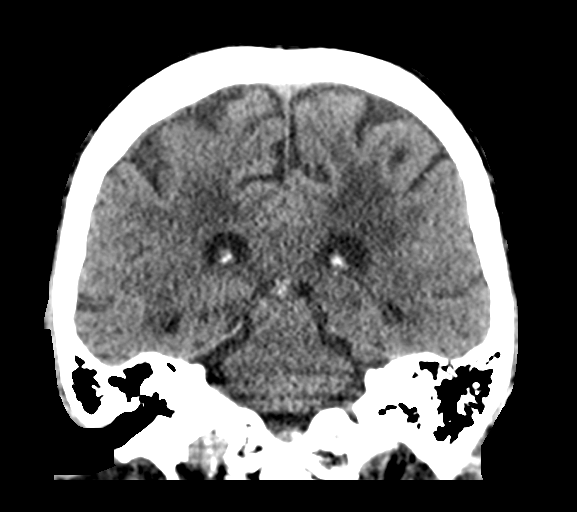
[im 37/67  brain]
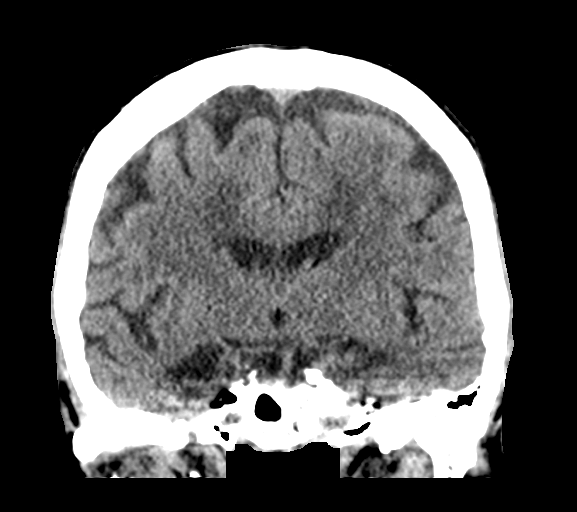

[Series 5: sagittal soft · sagittal · 0.31mm/px · 3 of 58 slices shown]
[im 20/58  brain]
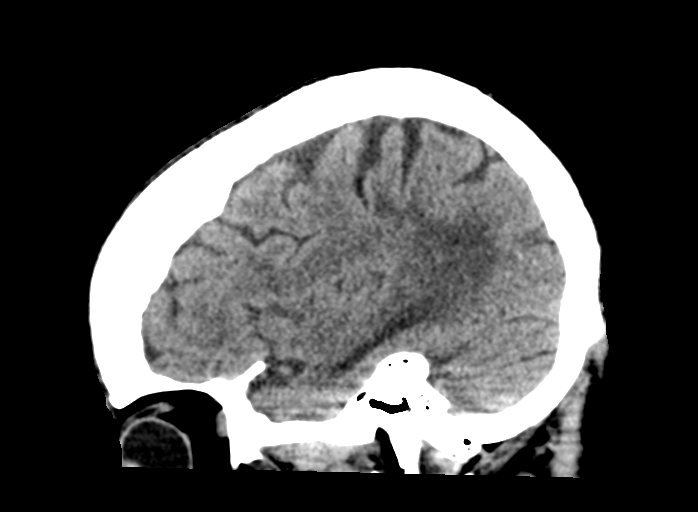
[im 29/58  brain]
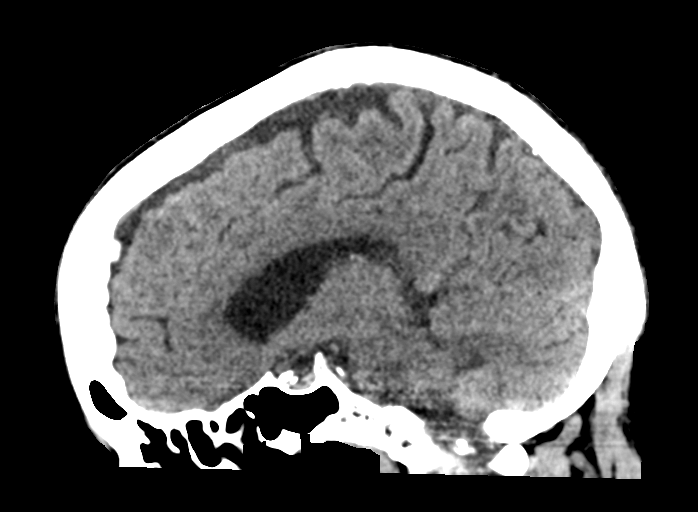
[im 39/58  brain]
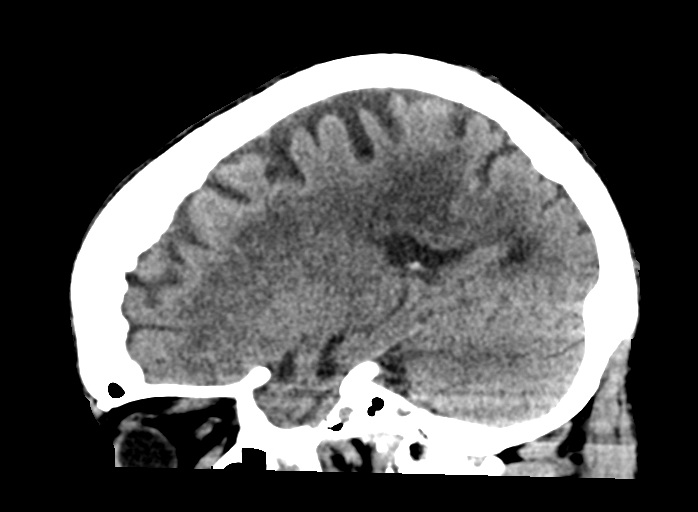

[16 of 47 positions shown; findings below may reference images not displayed]

FINDINGS: Brain: No evidence of acute infarction, hemorrhage, hydrocephalus,
extra-axial collection or mass lesion/mass effect. Confluent
low-density in the cerebral white matter. Age normal brain volume

Vascular: Atheromatous calcification.  No hyperdense vessel

Skull: Normal. Negative for fracture or focal lesion.

Sinuses/Orbits: Negative
IMPRESSION: 1. No acute finding.
2. Confluent chronic small vessel ischemia in the cerebral white
matter.

## 2020-09-02 IMAGING — DX DG CHEST 1V PORT
1 series · 1 of 1 positions shown · non-contrast
Comparison: [DATE]

CLINICAL DATA: Altered mental status

EXAM:
PORTABLE CHEST 1 VIEW

[chest ap]
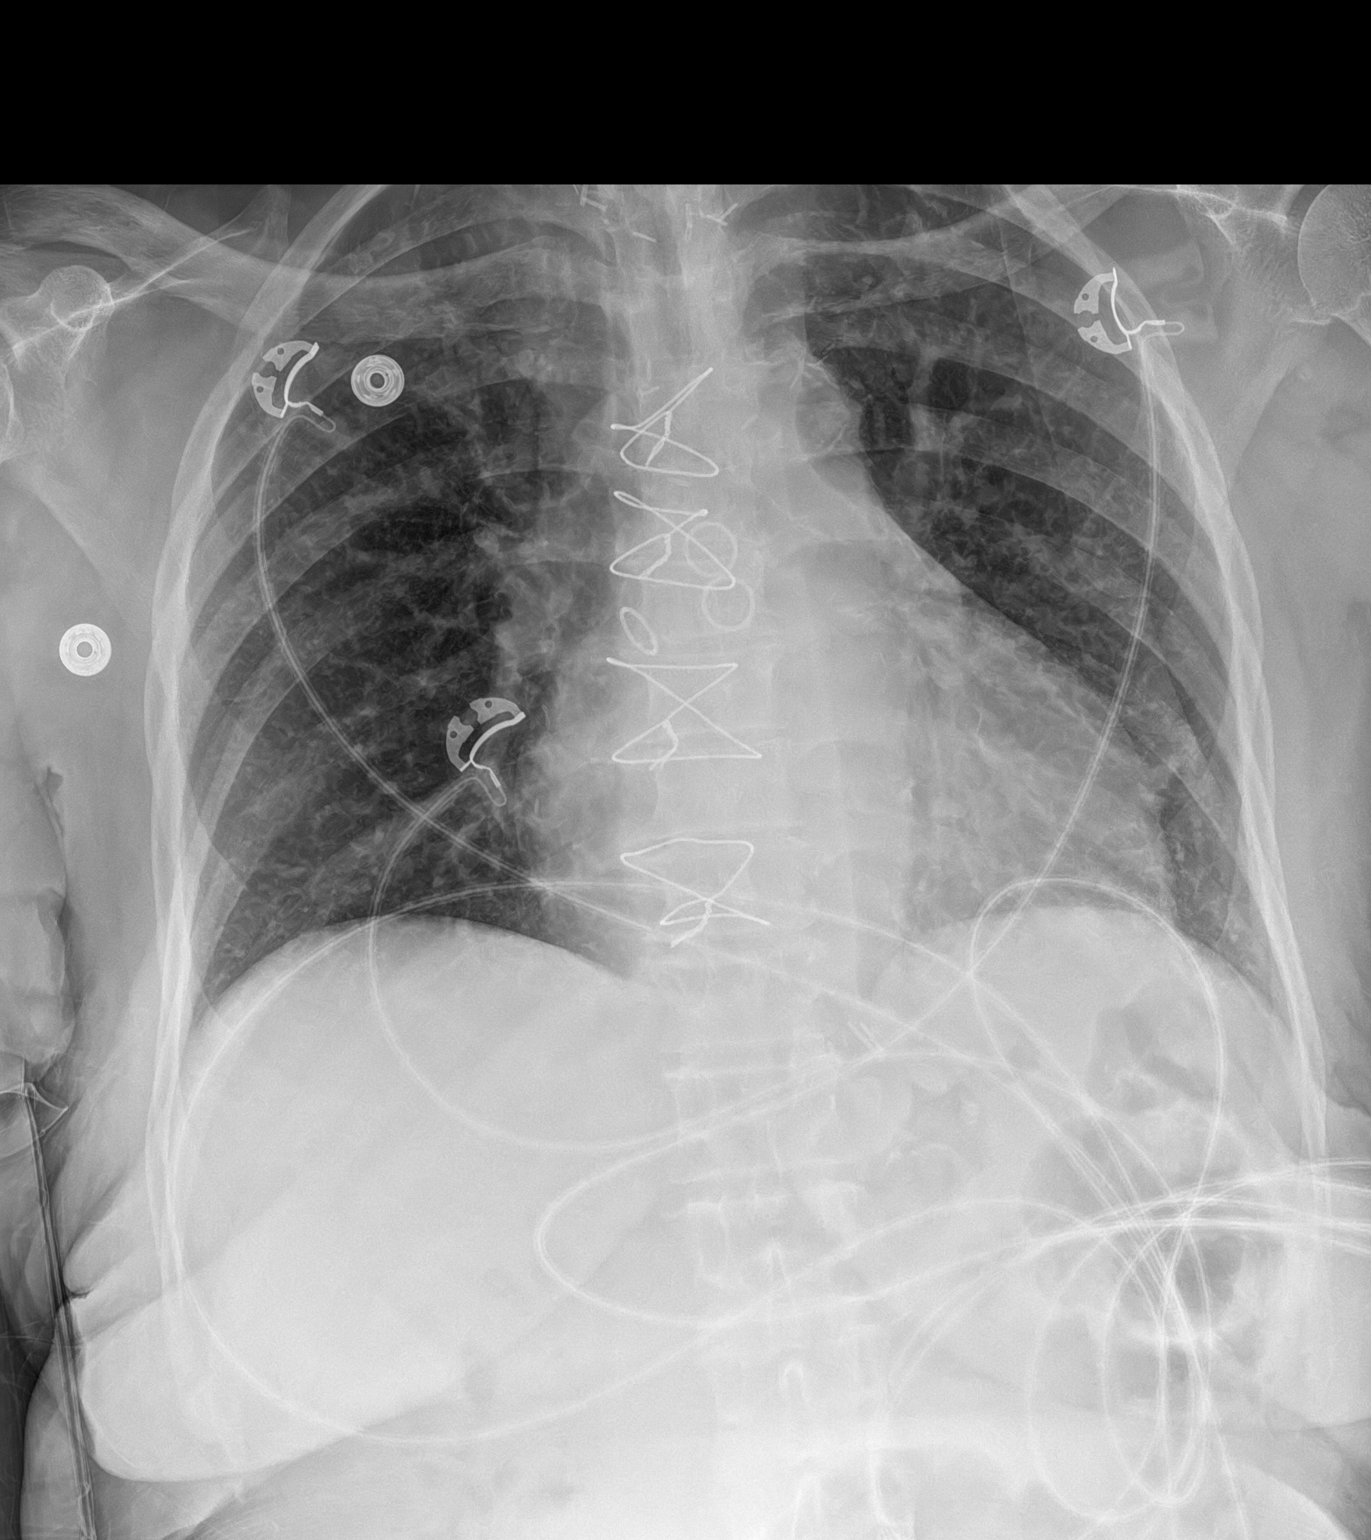

[1 of 1 positions shown; findings below may reference images not displayed]

FINDINGS: Post sternotomy changes. Surgical changes at the thoracic inlet.
Borderline to mild cardiomegaly with aortic atherosclerosis. No
consolidation, pleural effusion, or pneumothorax.
IMPRESSION: Borderline to mild cardiomegaly.

## 2020-09-02 MED ORDER — SODIUM CHLORIDE 0.9 % IV BOLUS
1000.0000 mL | Freq: Once | INTRAVENOUS | Status: AC
  Administered 2020-09-02: 1000 mL via INTRAVENOUS

## 2020-09-02 NOTE — ED Notes (Signed)
Pt back from CT, encouraged to call if assistance needed; purewick placed back on pt; pt verbalized understanding

## 2020-09-02 NOTE — ED Notes (Signed)
EKG given to Dr. Rancour 

## 2020-09-02 NOTE — ED Triage Notes (Signed)
Pt arrived via [facility] from home c/o dizziness from suspected mold in her home. Pt reported to EMS she thinks it is black mold, however, EMS report not being able to see or smell any mold in the home. Pt had recent total thyroidectomy at [facility].

## 2020-09-02 NOTE — Discharge Instructions (Signed)
Your testing is reassuring. No evidence of heart attack or stroke. Keep yourself hydrated and followup with your doctor. Return to the ED if you develop new or worsening symptoms.

## 2020-09-02 NOTE — ED Provider Notes (Signed)
[facility] Provider Note   CSN: [id number] Arrival date & time: 09/02/20  0013     History Chief Complaint  Patient presents with  . Dizziness    Erin Jordan is a 75 y.o. female.  Level 5 caveat for suspected dementia.  Patient is a poor historian.  She arrives via EMS with a chief complaint of dizziness but states that she does not know why EMS was called.  Earlier this evening she apparently began feeling sick at home with chills and nausea but no vomiting.  She felt dizzy and lightheaded but cannot really provide details of what she was feeling.  She denies any vomiting.  She denies any focal weakness, numbness or tingling.  She denies any chest pain or shortness of breath.  No abdominal pain or vomiting.  No pain with urination or blood in the urine.   She states she is feeling better at this time is still having some chills.  She apparently has had sick contacts at home. She underwent a total thyroidectomy at [facility] in April.  Additional information obtained from patient's granddaughter [name] by phone.  Patient stated that she "felt funny in her head" earlier today but could not provide further details.  The family became concerned given patient's recent thyroid surgery and adjustment of medications.  There is no documented fever, chills, nausea or vomiting. Patient does have a documented history of dementia and is no more confused than usual.  The history is provided by the patient.  Dizziness Associated symptoms: nausea and weakness   Associated symptoms: no chest pain and no shortness of breath        Past Medical History:  Diagnosis Date  . Arthritis   . Cancer of kidney ([place])   . CKD (chronic kidney disease), stage IV ([place])   . Coronary atherosclerosis of native coronary artery 2006   Multivessel status post CABG in [place], graft disease documented March 2019 with DES to SVG to diagonal  . Diabetes mellitus without complication  ([place])    hx of not currently on meds at preop visit of 07/19/20  . Essential hypertension   . 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 History of diabetes mellitus, type II   . Hyperlipidemia   . Hypothyroidism   . NSTEMI (non-ST elevated myocardial infarction) ([place]) 06/24/2017  . Pneumonia    hx of 2021   . Renal hematoma   . Right renal mass     Patient Active Problem List   Diagnosis Date Noted  . 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  . CKD (chronic kidney disease), stage IV ([place]) 03/30/2020  . Renal cell carcinoma ([place]) 08/02/2019  . Acute blood loss anemia 08/02/2019  . RLS (restless legs syndrome) 01/25/2019  . Right renal mass 01/25/2019  . Splenic mass 01/25/2019  . Blood in stool 01/25/2019  . Dementia without behavioral disturbance ([place]) 01/25/2019  . Osteopenia 05/20/2018  . Weight loss, unintentional 07/14/2017  . CAD -S/P CABG in 2006 AND  PCI 06/26/17 06/27/2017  . 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 Hx of CABG- 2006   . History of diabetes mellitus   . Essential hypertension   . Hypokalemia 06/02/2011  . Dyslipidemia, goal LDL below 70 06/02/2011    Past Surgical History:  Procedure Laterality Date  . ABDOMINAL HYSTERECTOMY    . CORONARY ARTERY BYPASS GRAFT  2006   [place], [place]  . CORONARY STENT INTERVENTION N/A 06/25/2017   Procedure: CORONARY STENT INTERVENTION;  Surgeon: Jettie Booze, MD;  Location: [facility];  Service: Cardiovascular;  Laterality: N/A;  . IR RADIOLOGIST EVAL & MGMT  07/08/2019  . LEFT HEART CATH AND CORS/GRAFTS ANGIOGRAPHY N/A 06/25/2017   Procedure: LEFT HEART CATH AND CORS/GRAFTS ANGIOGRAPHY;  Surgeon: Jettie Booze, MD;  Location: [facility];  Service: Cardiovascular;  Laterality: N/A;  . THYROIDECTOMY N/A 07/28/2020   Procedure: TOTAL THYROIDECTOMY;  Surgeon: Armandina Gemma, MD;  Location: [facility];  Service: General;  Laterality: N/A;  2 HOURS     OB History    Gravida      Para      Term       Preterm      AB      Living  6     SAB      IAB      Ectopic      Multiple      Live Births              Family History  Problem Relation Age of Onset  . Heart attack Mother   . Hypertension Mother   . Seizures Son   . Seizures Son   . Seizures Daughter     Social History   Tobacco Use  . Smoking status: Never Smoker  . Smokeless tobacco: Never Used  Vaping Use  . Vaping Use: Never used  Substance Use Topics  . Alcohol use: No  . Drug use: No    Home Medications Prior to Admission medications   Medication Sig Start Date End Date Taking? Authorizing Provider  albuterol (VENTOLIN HFA) 108 (90 Base) MCG/ACT inhaler Inhale 2 puffs into the lungs every 6 (six) hours as needed for wheezing or shortness of breath. 03/30/20   Roxan Hockey, MD  amLODipine (NORVASC) 10 MG tablet Take 1 tablet (10 mg total) by mouth daily. 03/30/20   Roxan Hockey, MD  aspirin EC 81 MG tablet Take 81 mg by mouth daily. Swallow whole.    [provider]  atorvastatin (LIPITOR) 80 MG tablet TAKE 1 TABLET BY MOUTH ONCE DAILY. 08/24/20   [name], FNP  calcium carbonate (TUMS) 500 MG chewable tablet Chew 2 tablets (400 mg of elemental calcium total) by mouth 3 (three) times daily. 07/28/20   Armandina Gemma, MD  carvedilol (COREG) 12.5 MG tablet Take 1 tablet (12.5 mg total) by mouth 2 (two) times daily. For BP and Heart 03/30/20   Emokpae, Courage, MD  donepezil (ARICEPT) 10 MG tablet TAKE (1) TABLET BY MOUTH AT BEDTIME. 08/24/20   [name], FNP  levothyroxine (SYNTHROID) 100 MCG tablet Take 1 tablet (100 mcg total) by mouth daily before breakfast. 08/24/20   Nida, Marella Chimes, MD  nitroGLYCERIN (NITROSTAT) 0.4 MG SL tablet Place 1 tablet (0.4 mg total) under the tongue every 5 (five) minutes x 3 doses as needed for chest pain. 07/07/19   Satira Sark, MD  traZODone (DESYREL) 50 MG tablet Take 1 tablet (50 mg total) by mouth at bedtime. 03/30/20    Roxan Hockey, MD    Allergies    Patient has no known allergies.  Review of Systems   Review of Systems  Constitutional: Positive for activity change, appetite change and fatigue.  HENT: Negative for congestion and rhinorrhea.   Respiratory: Negative for chest tightness and shortness of breath.   Cardiovascular: Negative for chest pain.  Gastrointestinal: Positive for nausea.  Genitourinary: Negative for dysuria and hematuria.  Musculoskeletal: Negative for  arthralgias and myalgias.  Skin: Negative for rash.  Neurological: Positive for dizziness and weakness.    all other systems are negative except as noted in the HPI and PMH.  Physical Exam Updated Vital Signs BP (!) 165/91   Pulse 73   Temp 97.9 F (36.6 C) (Oral)   Resp 17   Ht 5\' 1"  (1.549 m)   Wt 54 kg   SpO2 100%   BMI 22.49 kg/m   Physical Exam Vitals and nursing note reviewed.  Constitutional:      General: She is not in acute distress.    Appearance: She is well-developed.     Comments: Oriented to person and place  HENT:     Head: Normocephalic and atraumatic.     Mouth/Throat:     Pharynx: No oropharyngeal exudate.  Eyes:     Conjunctiva/sclera: Conjunctivae normal.     Pupils: Pupils are equal, round, and reactive to light.  Neck:     Comments: No meningismus.  Healing surgical incision anterior neck Cardiovascular:     Rate and Rhythm: Normal rate and regular rhythm.     Heart sounds: Normal heart sounds. No murmur heard.   Pulmonary:     Effort: Pulmonary effort is normal. No respiratory distress.     Breath sounds: Normal breath sounds.  Abdominal:     Palpations: Abdomen is soft.     Tenderness: There is no abdominal tenderness. There is no guarding or rebound.  Musculoskeletal:        General: No tenderness. Normal range of motion.     Cervical back: Normal range of motion and neck supple.  Skin:    General: Skin is warm.  Neurological:     Mental Status: She is alert.      Cranial Nerves: No cranial nerve deficit.     Motor: No abnormal muscle tone.     Coordination: Coordination normal.     Comments: CN 2-12 intact, no ataxia on finger to nose, no nystagmus, 5/5 strength throughout, no pronator drift,  Negative Romberg, normal gait.  No ataxia.  Psychiatric:        Behavior: Behavior normal.     ED Results / Procedures / Treatments   Labs (all labs ordered are listed, but only abnormal results are displayed) Labs Reviewed  CBC WITH DIFFERENTIAL/PLATELET - Abnormal; Notable for the following components:      Result Value   RBC 3.82 (*)    Hemoglobin 11.9 (*)    All other components within normal limits  URINALYSIS, ROUTINE W REFLEX MICROSCOPIC - Abnormal; Notable for the following components:   Color, Urine COLORLESS (*)    Protein, ur 100 (*)    Leukocytes,Ua TRACE (*)    All other components within normal limits  BASIC METABOLIC PANEL - Abnormal; Notable for the following components:   Glucose, Bld 124 (*)    BUN 26 (*)    Creatinine, Ser 2.36 (*)    Calcium 8.8 (*)    GFR, Estimated 21 (*)    All other components within normal limits  LIPASE, BLOOD - Abnormal; Notable for the following components:   Lipase 76 (*)    All other components within normal limits  TSH - Abnormal; Notable for the following components:   TSH 25.022 (*)    All other components within normal limits  RESP PANEL BY RT-PCR (FLU A&B, COVID) [id number]  PROTIME-INR  HEPATIC FUNCTION PANEL  T4, FREE  TROPONIN I (HIGH SENSITIVITY)  TROPONIN I (  HIGH SENSITIVITY)  TROPONIN I (HIGH SENSITIVITY)    EKG EKG Interpretation  Date/Time:  Saturday Sep 02 2020 03:30:51 EDT Ventricular Rate:  69 PR Interval:  216 QRS Duration: 84 QT Interval:  424 QTC Calculation: 454 R Axis:   11 Text Interpretation: Sinus rhythm with 1st degree A-V block Low voltage QRS Cannot rule out Anterior infarct , age undetermined Abnormal ECG No significant change was found Confirmed by Ezequiel Essex [phone]) on 09/02/2020 3:41:51 AM   Radiology CT Head Wo Contrast  Result Date: 09/02/2020 CLINICAL DATA:  Nonspecific dizziness EXAM: CT HEAD WITHOUT CONTRAST TECHNIQUE: Contiguous axial images were obtained from the base of the skull through the vertex without intravenous contrast. COMPARISON:  10/14/2019 FINDINGS: Brain: No evidence of acute infarction, hemorrhage, hydrocephalus, extra-axial collection or mass lesion/mass effect. Confluent low-density in the cerebral white matter. Age normal brain volume Vascular: Atheromatous calcification.  No hyperdense vessel Skull: Normal. Negative for fracture or focal lesion. Sinuses/Orbits: Negative IMPRESSION: 1. No acute finding. 2. Confluent chronic small vessel ischemia in the cerebral white matter. Electronically Signed   By: Monte Fantasia M.D.   On: 09/02/2020 05:03   DG Chest Portable 1 View  Result Date: 09/02/2020 CLINICAL DATA:  Altered mental status EXAM: PORTABLE CHEST 1 VIEW COMPARISON:  07/19/2020 FINDINGS: Post sternotomy changes. Surgical changes at the thoracic inlet. Borderline to mild cardiomegaly with aortic atherosclerosis. No consolidation, pleural effusion, or pneumothorax. IMPRESSION: Borderline to mild cardiomegaly. Electronically Signed   By: Donavan Foil M.D.   On: 09/02/2020 04:01    Procedures Procedures   Medications Ordered in ED Medications  sodium chloride 0.9 % bolus 1,000 mL (has no administration in time range)    ED Course  I have reviewed the triage vital signs and the nursing notes.  Pertinent labs & imaging results that were available during my care of the patient were reviewed by me and considered in my medical decision making (see chart for details).    MDM Rules/Calculators/A&P                         Patient is a poor historian.  Nonspecific chills and dizziness with nausea that has since improved.  Nonfocal neurological exam with no facial droop and symmetric strength. She is able to  ambulate.  No ataxia on finger-to-nose.  Orthostatics are negative.  CT head is stable.  Creatinine is 2.36 which is in patient's range of previous values.  Troponin is negative.  On recheck, patient is feeling improved.  She is tolerating p.o. and ambulatory.  She denies any headache, dizziness, chest pain, abdominal pain.  She is not certain but believes that she had some abdominal pain at home earlier today.  Denies ever having a headache.  Does state that she felt lightheaded at home.  Family apparently activated EMS due to this.  They are also concerned about possibility of mold exposure in patient's apartment but this has not been confirmed.  With her complaint of abdominal pain we will proceed with imaging of her abdomen though do not suspect acute surgical pathology.  Her urinalysis is negative.  Anticipate she will be able to be discharged home pending results of CT scan and second troponin.  Care to be transferred at shift change. Final Clinical Impression(s) / ED Diagnoses Final diagnoses:  Chills  Near syncope    Rx / DC Orders ED Discharge Orders    None       Scottie Metayer, Annie Main,  MD 09/02/20 [id number]

## 2020-09-02 NOTE — ED Notes (Signed)
Called and spoke with [facility] and informed her pt is ready to be discharged and picked up; she verbalized understanding

## 2020-09-02 NOTE — ED Triage Notes (Signed)
Pt c/o dizziness that started earlier today, pt says she is better now, just a little dizzy. Pt ambulatory with steady gait from lobby to room 1.

## 2020-09-02 NOTE — ED Notes (Signed)
Pt given water- tolerated well.

## 2020-09-02 NOTE — ED Notes (Addendum)
In room to obtain orthostatic vitals on patient. Upon getting patient up out of bed to do standing vitals, thought I saw something crawling on patient's jacket. Upon looking inside patient there was bed bug crawling on her. Was able to capture bug in container and given to [employer].

## 2020-09-02 NOTE — ED Notes (Signed)
Pt ambulatory with no difficulty or distress noted. 

## 2020-09-02 NOTE — ED Notes (Signed)
Pt gone to CT 

## 2020-09-02 NOTE — ED Provider Notes (Signed)
Blood pressure 129/63, pulse (!) 59, temperature 97.9 F (36.6 C), temperature source Oral, resp. rate 15, height 5\' 1"  (1.549 m), weight 54 kg, SpO2 100 %.  Assuming care from Dr. Wyvonnia Dusky.  In short, Erin Jordan is a 75 y.o. female with a chief complaint of Dizziness .  Refer to the original H&P for additional details.  The current plan of care is to follow up on CT and COVID.   09:20 AM  COVID/Flu negative. Troponin negative. CT abdomen/pelvis with no acute findings.    IMPRESSION: 1. Generalized colonic distention by stool and gas. 2. Known right renal mass. 3. Aortic Atherosclerosis (ICD10-I70.0).   Electronically Signed By: Erin Jordan M.D. On: 09/02/2020 08:04  09:25 AM  Went to update the patient.  She is up and ambulatory in the room without difficulty.  She states that her symptoms have improved while in the emergency department.  Plan for discharge home with close PCP follow-up.  Discussed hydration strategy along with ED return precautions.    Erin Fast, MD 09/02/20 0930

## 2020-09-02 NOTE — ED Notes (Signed)
Pt found sitting on side of bed with leads pulled off and purewick laying on bed; pt states she had an incontinence of bladder; pt has sitter at bedside now due to not staying in bed

## 2020-09-03 ENCOUNTER — Other Ambulatory Visit: Payer: Self-pay

## 2020-09-03 ENCOUNTER — Emergency Department (HOSPITAL_COMMUNITY)
Admission: EM | Admit: 2020-09-03 | Discharge: 2020-09-04 | Disposition: A | Discharge status: Home / Self Care | Payer: Medicare HMO | Attending: Emergency Medicine | Admitting: Emergency Medicine

## 2020-09-03 DIAGNOSIS — F0390 Unspecified dementia without behavioral disturbance: Secondary | ICD-10-CM | POA: Insufficient documentation

## 2020-09-03 DIAGNOSIS — I129 Hypertensive chronic kidney disease with stage 1 through stage 4 chronic kidney disease, or unspecified chronic kidney disease: Secondary | ICD-10-CM | POA: Insufficient documentation

## 2020-09-03 DIAGNOSIS — Z79899 Other long term (current) drug therapy: Secondary | ICD-10-CM | POA: Diagnosis not present

## 2020-09-03 DIAGNOSIS — Z7982 Long term (current) use of aspirin: Secondary | ICD-10-CM | POA: Insufficient documentation

## 2020-09-03 DIAGNOSIS — Z951 Presence of aortocoronary bypass graft: Secondary | ICD-10-CM | POA: Insufficient documentation

## 2020-09-03 DIAGNOSIS — R42 Dizziness and giddiness: Secondary | ICD-10-CM | POA: Insufficient documentation

## 2020-09-03 DIAGNOSIS — E1122 Type 2 diabetes mellitus with diabetic chronic kidney disease: Secondary | ICD-10-CM | POA: Diagnosis not present

## 2020-09-03 DIAGNOSIS — R531 Weakness: Secondary | ICD-10-CM | POA: Diagnosis not present

## 2020-09-03 DIAGNOSIS — I2510 Atherosclerotic heart disease of native coronary artery without angina pectoris: Secondary | ICD-10-CM | POA: Insufficient documentation

## 2020-09-03 DIAGNOSIS — E039 Hypothyroidism, unspecified: Secondary | ICD-10-CM | POA: Diagnosis not present

## 2020-09-03 DIAGNOSIS — N184 Chronic kidney disease, stage 4 (severe): Secondary | ICD-10-CM | POA: Insufficient documentation

## 2020-09-03 DIAGNOSIS — R110 Nausea: Secondary | ICD-10-CM | POA: Insufficient documentation

## 2020-09-03 DIAGNOSIS — Z85528 Personal history of other malignant neoplasm of kidney: Secondary | ICD-10-CM | POA: Insufficient documentation

## 2020-09-03 MED ORDER — SODIUM CHLORIDE 0.9 % IV BOLUS
1000.0000 mL | Freq: Once | INTRAVENOUS | Status: AC
  Administered 2020-09-04: 1000 mL via INTRAVENOUS

## 2020-09-03 NOTE — ED Triage Notes (Signed)
Pt here for unresolved dizziness since Friday. Per ems pt also started giggling after family sprayed disinfectant, pt ambulatory from stretcher to treatment room,  Bed bugs also found on patient.

## 2020-09-03 NOTE — ED Provider Notes (Signed)
[facility] EMERGENCY DEPARTMENT Provider Note   CSN: [id number] Arrival date & time: 09/03/20  2217     History Chief Complaint  Patient presents with  . Dizziness    Erin Jordan is a 75 y.o. female.  Level 5 caveat for dementia.  Patient returns with ongoing dizziness for the past several days.  She was seen on May 28 by myself for similar symptoms.  She has a history of dementia, CKD, cancer of the kidney, diabetes, hypertension and previous CABG. She is a poor historian.  States she been having intermittent dizzy spells for the past several days.  They come and go lasting for several minutes to hours at a time.  She denies room spinning sensation but says it is more of a lightheaded feeling.  Denies any focal weakness, numbness or tingling.  Denies any chest pain or shortness of breath.  No abdominal pain or vomiting.  No fever. No travel or sick contacts. Family has reported that she has had increased dizzy spells and this is the same thing has been happening for the past 3 days.  She is not dizzy currently.  She denies any pain. She is oriented to person and place  Discussed with patient's granddaughter [name] by phone.  She was unaware that patient was sent to the ED and does not know why she was sent here this evening.  She suggest calling her Sister zina [phone]. There is no answer at this number  Concern given patient's recent thyroid surgery and adjustment of medications.  She underwent total thyroidectomy in April.  The history is provided by the patient.  Dizziness Associated symptoms: nausea and weakness   Associated symptoms: no chest pain, no shortness of breath and no vomiting        Past Medical History:  Diagnosis Date  . Arthritis   . Cancer of kidney ([place])   . CKD (chronic kidney disease), stage IV ([place])   . Coronary atherosclerosis of native coronary artery 2006   Multivessel status post CABG in [place], graft disease documented March  2019 with DES to SVG to diagonal  . Diabetes mellitus without complication ([place])    hx of not currently on meds at preop visit of 07/19/20  . Essential hypertension   . 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 History of diabetes mellitus, type II   . Hyperlipidemia   . Hypothyroidism   . NSTEMI (non-ST elevated myocardial infarction) ([place]) 06/24/2017  . Pneumonia    hx of 2021   . Renal hematoma   . Right renal mass     Patient Active Problem List   Diagnosis Date Noted  . 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  . CKD (chronic kidney disease), stage IV ([place]) 03/30/2020  . Renal cell carcinoma ([place]) 08/02/2019  . Acute blood loss anemia 08/02/2019  . RLS (restless legs syndrome) 01/25/2019  . Right renal mass 01/25/2019  . Splenic mass 01/25/2019  . Blood in stool 01/25/2019  . Dementia without behavioral disturbance ([place]) 01/25/2019  . Osteopenia 05/20/2018  . Weight loss, unintentional 07/14/2017  . CAD -S/P CABG in 2006 AND  PCI 06/26/17 06/27/2017  . 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 Hx of CABG- 2006   . History of diabetes mellitus   . Essential hypertension   . Hypokalemia 06/02/2011  . Dyslipidemia, goal LDL below 70 06/02/2011    Past Surgical History:  Procedure Laterality Date  . ABDOMINAL HYSTERECTOMY    . CORONARY ARTERY BYPASS GRAFT  [address] [place]  . CORONARY STENT INTERVENTION N/A 06/25/2017   Procedure: CORONARY STENT INTERVENTION;  Surgeon: Jettie Booze, MD;  Location: [facility];  Service: Cardiovascular;  Laterality: N/A;  . IR RADIOLOGIST EVAL & MGMT  07/08/2019  . LEFT HEART CATH AND CORS/GRAFTS ANGIOGRAPHY N/A 06/25/2017   Procedure: LEFT HEART CATH AND CORS/GRAFTS ANGIOGRAPHY;  Surgeon: Jettie Booze, MD;  Location: [facility];  Service: Cardiovascular;  Laterality: N/A;  . THYROIDECTOMY N/A 07/28/2020   Procedure: TOTAL THYROIDECTOMY;  Surgeon: Armandina Gemma, MD;  Location: [facility];  Service: General;   Laterality: N/A;  2 HOURS     OB History    Gravida      Para      Term      Preterm      AB      Living  6     SAB      IAB      Ectopic      Multiple      Live Births              Family History  Problem Relation Age of Onset  . Heart attack Mother   . Hypertension Mother   . Seizures Son   . Seizures Son   . Seizures Daughter     Social History   Tobacco Use  . Smoking status: Never Smoker  . Smokeless tobacco: Never Used  Vaping Use  . Vaping Use: Never used  Substance Use Topics  . Alcohol use: No  . Drug use: No    Home Medications Prior to Admission medications   Medication Sig Start Date End Date Taking? Authorizing Provider  albuterol (VENTOLIN HFA) 108 (90 Base) MCG/ACT inhaler Inhale 2 puffs into the lungs every 6 (six) hours as needed for wheezing or shortness of breath. 03/30/20   Roxan Hockey, MD  amLODipine (NORVASC) 10 MG tablet Take 1 tablet (10 mg total) by mouth daily. 03/30/20   Roxan Hockey, MD  aspirin EC 81 MG tablet Take 81 mg by mouth daily. Swallow whole.    [provider]  atorvastatin (LIPITOR) 80 MG tablet TAKE 1 TABLET BY MOUTH ONCE DAILY. 08/24/20   [name], FNP  calcium carbonate (TUMS) 500 MG chewable tablet Chew 2 tablets (400 mg of elemental calcium total) by mouth 3 (three) times daily. 07/28/20   Armandina Gemma, MD  carvedilol (COREG) 12.5 MG tablet Take 1 tablet (12.5 mg total) by mouth 2 (two) times daily. For BP and Heart 03/30/20   Emokpae, Courage, MD  donepezil (ARICEPT) 10 MG tablet TAKE (1) TABLET BY MOUTH AT BEDTIME. 08/24/20   [name], FNP  levothyroxine (SYNTHROID) 100 MCG tablet Take 1 tablet (100 mcg total) by mouth daily before breakfast. 08/24/20   Nida, Marella Chimes, MD  nitroGLYCERIN (NITROSTAT) 0.4 MG SL tablet Place 1 tablet (0.4 mg total) under the tongue every 5 (five) minutes x 3 doses as needed for chest pain. 07/07/19   Satira Sark, MD  traZODone  (DESYREL) 50 MG tablet Take 1 tablet (50 mg total) by mouth at bedtime. 03/30/20   Roxan Hockey, MD    Allergies    Patient has no known allergies.  Review of Systems   Review of Systems  Constitutional: Negative for activity change, appetite change and fever.  HENT: Negative for congestion and rhinorrhea.   Respiratory: Negative for chest tightness and shortness of breath.   Cardiovascular: Negative  for chest pain.  Gastrointestinal: Positive for nausea. Negative for abdominal pain and vomiting.  Genitourinary: Negative for dysuria and hematuria.  Musculoskeletal: Negative for arthralgias and myalgias.  Neurological: Positive for dizziness, weakness and light-headedness.    all other systems are negative except as noted in the HPI and PMH.   Physical Exam Updated Vital Signs BP (!) 145/61   Pulse 75   Temp 98.3 F (36.8 C)   Resp 17   SpO2 100%   Physical Exam Vitals and nursing note reviewed.  Constitutional:      General: She is not in acute distress.    Appearance: She is well-developed.     Comments: Oriented to person and place  HENT:     Head: Normocephalic and atraumatic.     Mouth/Throat:     Pharynx: No oropharyngeal exudate.  Eyes:     Conjunctiva/sclera: Conjunctivae normal.     Pupils: Pupils are equal, round, and reactive to light.  Neck:     Comments: No meningismus. Healing surgical incisions to anterior neck. no signs of infection Cardiovascular:     Rate and Rhythm: Normal rate and regular rhythm.     Heart sounds: Normal heart sounds. No murmur heard.   Pulmonary:     Effort: Pulmonary effort is normal. No respiratory distress.     Breath sounds: Normal breath sounds.  Abdominal:     Palpations: Abdomen is soft.     Tenderness: There is no abdominal tenderness. There is no guarding or rebound.  Musculoskeletal:        General: No tenderness. Normal range of motion.     Cervical back: Normal range of motion and neck supple.  Skin:     General: Skin is warm.  Neurological:     Mental Status: She is alert and oriented to person, place, and time.     Cranial Nerves: No cranial nerve deficit.     Motor: No abnormal muscle tone.     Coordination: Coordination normal.     Comments: CN 2-12 intact, no ataxia on finger to nose, no nystagmus, 5/5 strength throughout, no pronator drift, Romberg negative, normal gait.  No appreciable nystagmus.  No ataxia on finger-to-nose.  Normal gait.  Psychiatric:        Behavior: Behavior normal.     ED Results / Procedures / Treatments   Labs (all labs ordered are listed, but only abnormal results are displayed) Labs Reviewed  CBC WITH DIFFERENTIAL/PLATELET - Abnormal; Notable for the following components:      Result Value   RBC 3.15 (*)    Hemoglobin 10.0 (*)    HCT 30.1 (*)    All other components within normal limits  COMPREHENSIVE METABOLIC PANEL - Abnormal; Notable for the following components:   Glucose, Bld 104 (*)    BUN 30 (*)    Creatinine, Ser 2.44 (*)    Calcium 8.2 (*)    Total Protein 6.0 (*)    Albumin 3.2 (*)    GFR, Estimated 20 (*)    All other components within normal limits  URINALYSIS, ROUTINE W REFLEX MICROSCOPIC - Abnormal; Notable for the following components:   APPearance CLOUDY (*)    Protein, ur 100 (*)    Leukocytes,Ua MODERATE (*)    Bacteria, UA RARE (*)    All other components within normal limits  POC OCCULT BLOOD, ED  TROPONIN I (HIGH SENSITIVITY)  TROPONIN I (HIGH SENSITIVITY)    EKG EKG Interpretation  Date/Time:  Sunday  Sep 03 2020 22:28:59 EDT Ventricular Rate:  71 PR Interval:  211 QRS Duration: 94 QT Interval:  525 QTC Calculation: 571 R Axis:   1 Text Interpretation: Sinus rhythm Low voltage, precordial leads Borderline T abnormalities, anterior leads Prolonged QT interval Baseline wander in lead(s) II III aVF No significant change was found Confirmed by Ezequiel Essex [id number]) on 09/04/2020 5:01:59 AM   Radiology CT  ABDOMEN PELVIS WO CONTRAST  Result Date: 09/02/2020 CLINICAL DATA:  Acute nonlocalized abdominal pain EXAM: CT ABDOMEN AND PELVIS WITHOUT CONTRAST TECHNIQUE: Multidetector CT imaging of the abdomen and pelvis was performed following the standard protocol without IV contrast. COMPARISON:  05/18/2020 FINDINGS: Lower chest:  Coronary atherosclerosis. Hepatobiliary: No focal liver abnormality.No evidence of biliary obstruction or stone. Pancreas: Unremarkable. Spleen: Unremarkable. Adrenals/Urinary Tract: Negative adrenals. Solid-appearing right renal mass measuring at least 4 cm, similar to prior based on all projections. No hydronephrosis. Bilateral extrarenal pelvis. Unremarkable bladder. Stomach/Bowel:  Tortuous colon with generalized stool. Vascular/Lymphatic: No acute vascular abnormality. Diffuse atheromatous calcification. No mass or adenopathy. Reproductive:Hysterectomy Other: Small volume pelvic fluid which was also noted previously. Musculoskeletal: No acute abnormalities. Lumbar spine degeneration with L4/5 anterolisthesis. IMPRESSION: 1. Generalized colonic distention by stool and gas. 2. Known right renal mass. 3.  Aortic Atherosclerosis (ICD10-I70.0). Electronically Signed   By: Monte Fantasia M.D.   On: 09/02/2020 08:04   CT Head Wo Contrast  Result Date: 09/02/2020 CLINICAL DATA:  Nonspecific dizziness EXAM: CT HEAD WITHOUT CONTRAST TECHNIQUE: Contiguous axial images were obtained from the base of the skull through the vertex without intravenous contrast. COMPARISON:  10/14/2019 FINDINGS: Brain: No evidence of acute infarction, hemorrhage, hydrocephalus, extra-axial collection or mass lesion/mass effect. Confluent low-density in the cerebral white matter. Age normal brain volume Vascular: Atheromatous calcification.  No hyperdense vessel Skull: Normal. Negative for fracture or focal lesion. Sinuses/Orbits: Negative IMPRESSION: 1. No acute finding. 2. Confluent chronic small vessel ischemia in the  cerebral white matter. Electronically Signed   By: Monte Fantasia M.D.   On: 09/02/2020 05:03   DG Chest Portable 1 View  Result Date: 09/02/2020 CLINICAL DATA:  Altered mental status EXAM: PORTABLE CHEST 1 VIEW COMPARISON:  07/19/2020 FINDINGS: Post sternotomy changes. Surgical changes at the thoracic inlet. Borderline to mild cardiomegaly with aortic atherosclerosis. No consolidation, pleural effusion, or pneumothorax. IMPRESSION: Borderline to mild cardiomegaly. Electronically Signed   By: Donavan Foil M.D.   On: 09/02/2020 04:01    Procedures Procedures   Medications Ordered in ED Medications  sodium chloride 0.9 % bolus 1,000 mL (has no administration in time range)    ED Course  I have reviewed the triage vital signs and the nursing notes.  Pertinent labs & imaging results that were available during my care of the patient were reviewed by me and considered in my medical decision making (see chart for details).    MDM Rules/Calculators/A&P                         Patient with history of dementia here with recurrent dizzy spells that come and go lasting for several minutes to hours at a time.  Nonfocal neurological exam with no facial droop and symmetric strength.  No ataxia on exam.  Work-up from the other day showed a stable CT head as well as a negative CT abdomen pelvis.  EKG is unchanged.  Troponin is negative x2.   hemoglobin has downtrending slightly from 11.9-10 but Hemoccult is negative. UA is negative.  Creatinine 2.4 which seems to be within the range of her previous values  Patient's granddaughter [name] not aware she is not in the hospital.  Other family members not answering the phone.  Unclear why patient was sent back to the ED. Her neurological exam is nonfocal.  D/w Granddaughter Zina.  She states she called EMS last evening because patient was complaining of dizziness and having spells where she had to grab onto things to hold herself up.  She did not fall  or hit her head.  She also had some transient shortness of breath which resolved quickly. Patient in no respiratory distress here has had negative troponins, chest x-ray and EKG.  Discussed with patient's family that MRI is not available but would be the only way to rule out stroke completely.  There is no suggestion of stroke on her CT scan and her exam does not favor stroke either. Family requesting patient be transferred for MRI to rule out stroke.  They understand she will need to be sent to [facility].  Anticipate she will be able to be discharged back home if this is negative. Transfer discussed with Dr. Randal Buba.   Final Clinical Impression(s) / ED Diagnoses Final diagnoses:  None    Rx / DC Orders ED Discharge Orders    None       Diane Mochizuki, Annie Main, MD 09/04/20 [phone]

## 2020-09-04 ENCOUNTER — Emergency Department (HOSPITAL_COMMUNITY): Payer: Medicare HMO

## 2020-09-04 DIAGNOSIS — N184 Chronic kidney disease, stage 4 (severe): Secondary | ICD-10-CM | POA: Diagnosis not present

## 2020-09-04 DIAGNOSIS — Z951 Presence of aortocoronary bypass graft: Secondary | ICD-10-CM | POA: Diagnosis not present

## 2020-09-04 DIAGNOSIS — R42 Dizziness and giddiness: Secondary | ICD-10-CM | POA: Diagnosis present

## 2020-09-04 DIAGNOSIS — E039 Hypothyroidism, unspecified: Secondary | ICD-10-CM | POA: Diagnosis not present

## 2020-09-04 DIAGNOSIS — I2510 Atherosclerotic heart disease of native coronary artery without angina pectoris: Secondary | ICD-10-CM | POA: Diagnosis not present

## 2020-09-04 DIAGNOSIS — F0390 Unspecified dementia without behavioral disturbance: Secondary | ICD-10-CM | POA: Diagnosis not present

## 2020-09-04 DIAGNOSIS — E1122 Type 2 diabetes mellitus with diabetic chronic kidney disease: Secondary | ICD-10-CM | POA: Diagnosis not present

## 2020-09-04 DIAGNOSIS — R110 Nausea: Secondary | ICD-10-CM | POA: Diagnosis not present

## 2020-09-04 DIAGNOSIS — Z79899 Other long term (current) drug therapy: Secondary | ICD-10-CM | POA: Diagnosis not present

## 2020-09-04 DIAGNOSIS — Z7982 Long term (current) use of aspirin: Secondary | ICD-10-CM | POA: Diagnosis not present

## 2020-09-04 DIAGNOSIS — R531 Weakness: Secondary | ICD-10-CM | POA: Diagnosis not present

## 2020-09-04 DIAGNOSIS — I129 Hypertensive chronic kidney disease with stage 1 through stage 4 chronic kidney disease, or unspecified chronic kidney disease: Secondary | ICD-10-CM | POA: Diagnosis not present

## 2020-09-04 DIAGNOSIS — Z85528 Personal history of other malignant neoplasm of kidney: Secondary | ICD-10-CM | POA: Diagnosis not present

## 2020-09-04 LAB — COMPREHENSIVE METABOLIC PANEL
ALT: 14 U/L (ref 0–44)
AST: 17 U/L (ref 15–41)
Albumin: 3.2 g/dL — ABNORMAL LOW (ref 3.5–5.0)
Alkaline Phosphatase: 78 U/L (ref 38–126)
Anion gap: 5 (ref 5–15)
BUN: 30 mg/dL — ABNORMAL HIGH (ref 8–23)
CO2: 26 mmol/L (ref 22–32)
Calcium: 8.2 mg/dL — ABNORMAL LOW (ref 8.9–10.3)
Chloride: 104 mmol/L (ref 98–111)
Creatinine, Ser: 2.44 mg/dL — ABNORMAL HIGH (ref 0.44–1.00)
GFR, Estimated: 20 mL/min — ABNORMAL LOW (ref >60)
Glucose, Bld: 104 mg/dL — ABNORMAL HIGH (ref 70–99)
Potassium: 3.6 mmol/L (ref 3.5–5.1)
Sodium: 135 mmol/L (ref 135–145)
Total Bilirubin: 0.5 mg/dL (ref 0.3–1.2)
Total Protein: 6 g/dL — ABNORMAL LOW (ref 6.5–8.1)

## 2020-09-04 LAB — CBC WITH DIFFERENTIAL/PLATELET
Abs Immature Granulocytes: 0.03 10*3/uL (ref 0.00–0.07)
Basophils Absolute: 0 10*3/uL (ref 0.0–0.1)
Basophils Relative: 1 %
Eosinophils Absolute: 0.1 10*3/uL (ref 0.0–0.5)
Eosinophils Relative: 2 %
HCT: 30.1 % — ABNORMAL LOW (ref 36.0–46.0)
Hemoglobin: 10 g/dL — ABNORMAL LOW (ref 12.0–15.0)
Immature Granulocytes: 1 %
Lymphocytes Relative: 31 %
Lymphs Abs: 1.8 10*3/uL (ref 0.7–4.0)
MCH: 31.7 pg (ref 26.0–34.0)
MCHC: 33.2 g/dL (ref 30.0–36.0)
MCV: 95.6 fL (ref 80.0–100.0)
Monocytes Absolute: 0.7 10*3/uL (ref 0.1–1.0)
Monocytes Relative: 11 %
Neutro Abs: 3.1 10*3/uL (ref 1.7–7.7)
Neutrophils Relative %: 54 %
Platelets: 156 10*3/uL (ref 150–400)
RBC: 3.15 MIL/uL — ABNORMAL LOW (ref 3.87–5.11)
RDW: 14.1 % (ref 11.5–15.5)
WBC: 5.8 10*3/uL (ref 4.0–10.5)
nRBC: 0 % (ref 0.0–0.2)

## 2020-09-04 LAB — URINALYSIS, ROUTINE W REFLEX MICROSCOPIC
Bilirubin Urine: NEGATIVE
Glucose, UA: NEGATIVE mg/dL
Hgb urine dipstick: NEGATIVE
Ketones, ur: NEGATIVE mg/dL
Nitrite: NEGATIVE
Protein, ur: 100 mg/dL — AB
Specific Gravity, Urine: 1.011 (ref 1.005–1.030)
pH: 5 (ref 5.0–8.0)

## 2020-09-04 LAB — TROPONIN I (HIGH SENSITIVITY)
Troponin I (High Sensitivity): 6 ng/L (ref <18)
Troponin I (High Sensitivity): 8 ng/L (ref <18)

## 2020-09-04 LAB — POC OCCULT BLOOD, ED: Fecal Occult Bld: NEGATIVE

## 2020-09-04 IMAGING — MR MR MRA HEAD W/O CM
1 series · 19 of 48 positions shown · non-contrast
Comparison: No pertinent prior exam.
COMPARISON: [DATE]

CLINICAL DATA: Dizziness for the past few days.  Dementia

EXAM:
MRI HEAD WITHOUT CONTRAST
MRA HEAD WITHOUT CONTRAST
TECHNIQUE: Multiplanar, multi-echo pulse sequences of the brain and surrounding
structures were acquired without intravenous contrast. Angiographic
images of the Circle of Willis were acquired using MRA technique
without intravenous contrast.

[Series 9: 3d cow · axial · 0.5mm · 0.41mm/px · z∈[-133,-55]mm · 19 of 172 slices shown]
[im 1/172]
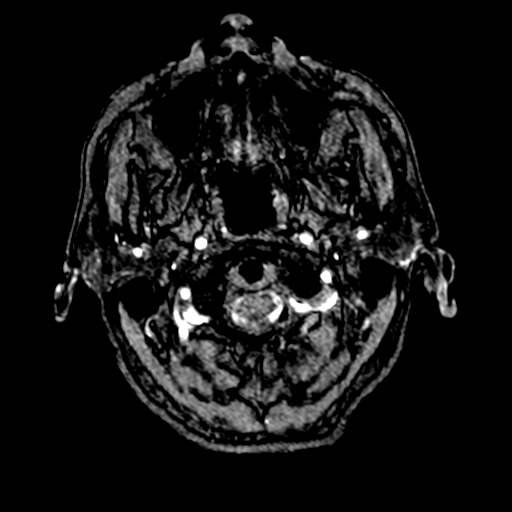
[im 4/172]
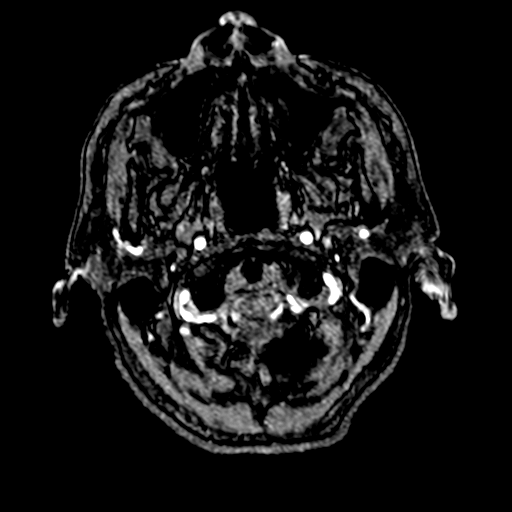
[im 8/172]
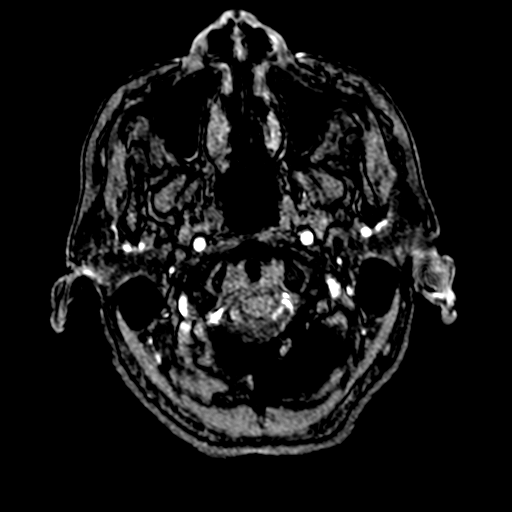
[im 11/172]
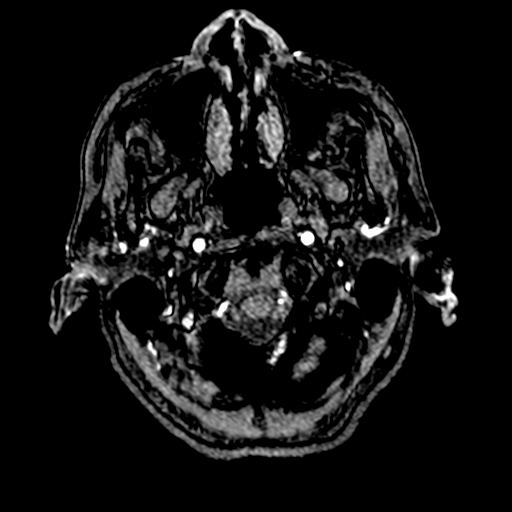
[im 15/172]
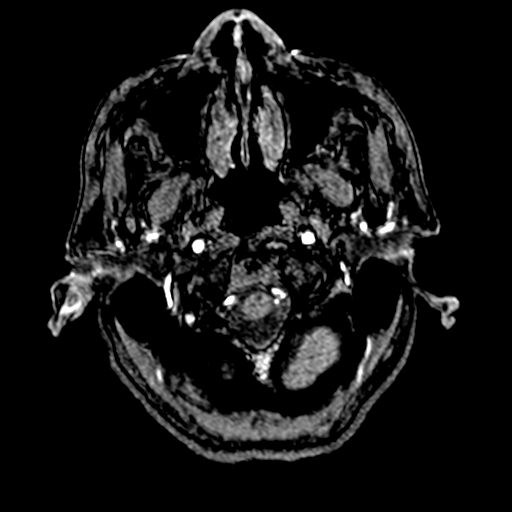
[im 19/172]
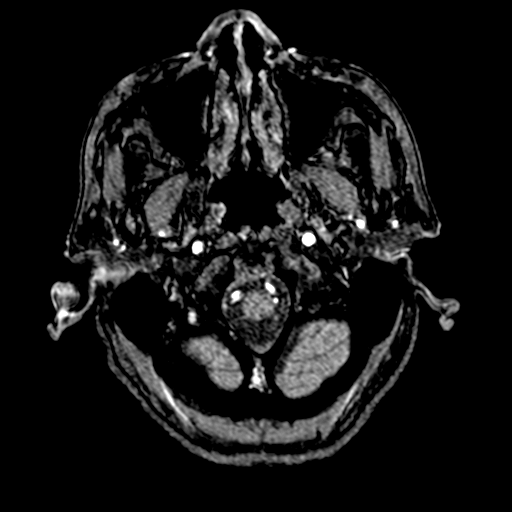
[im 22/172]
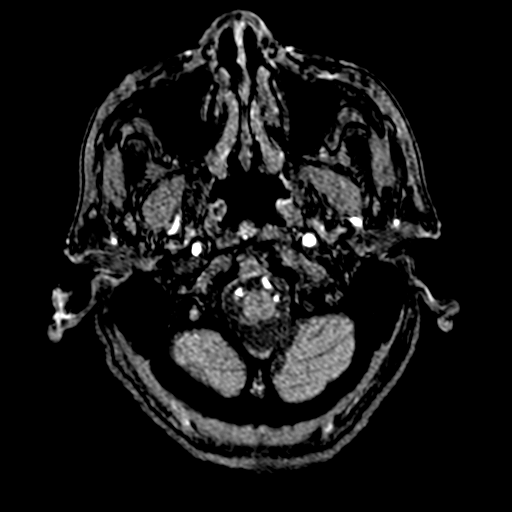
[im 26/172]
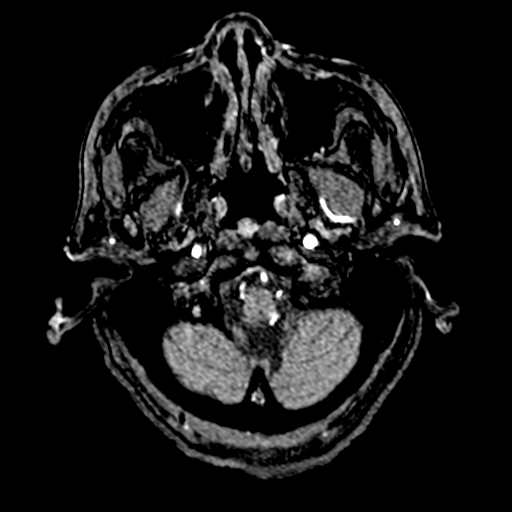
[im 30/172]
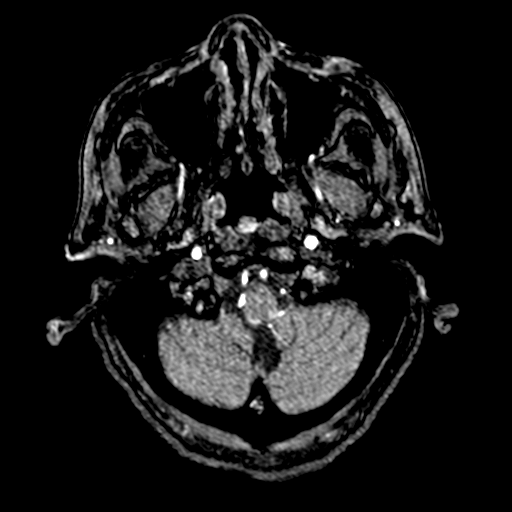
[im 33/172]
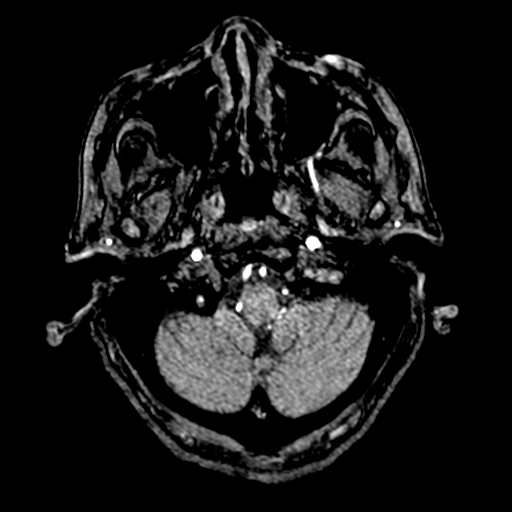
[im 37/172]
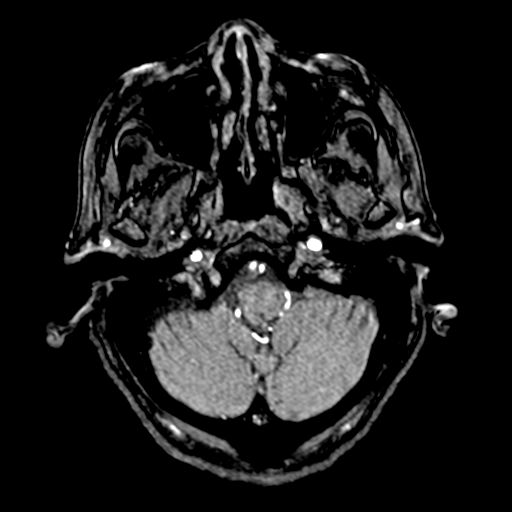
[im 55/172]
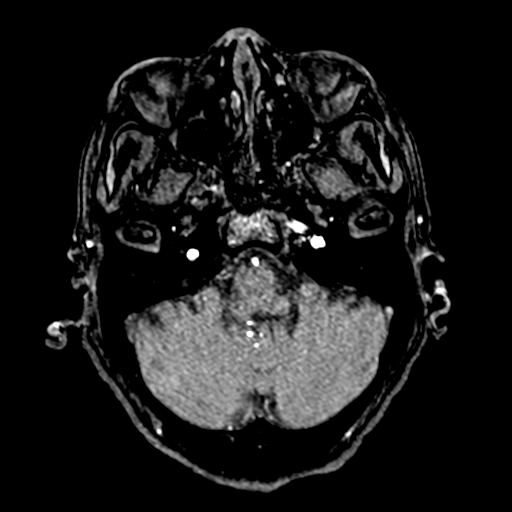
[im 77/172]
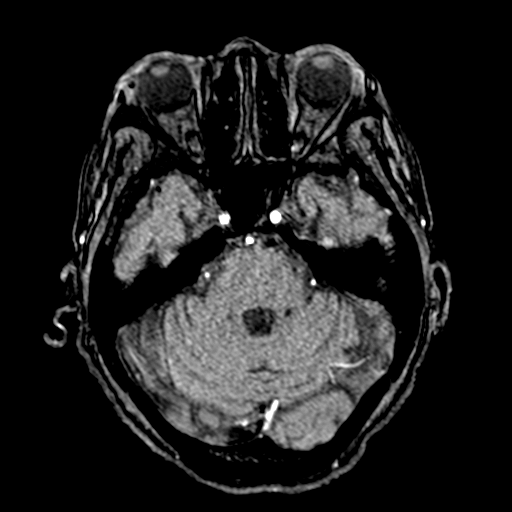
[im 88/172]
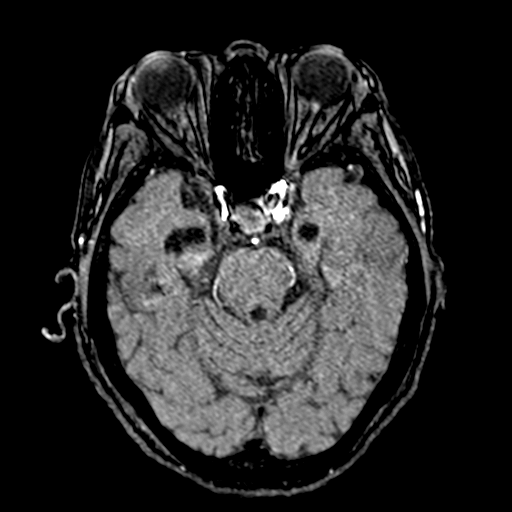
[im 99/172]
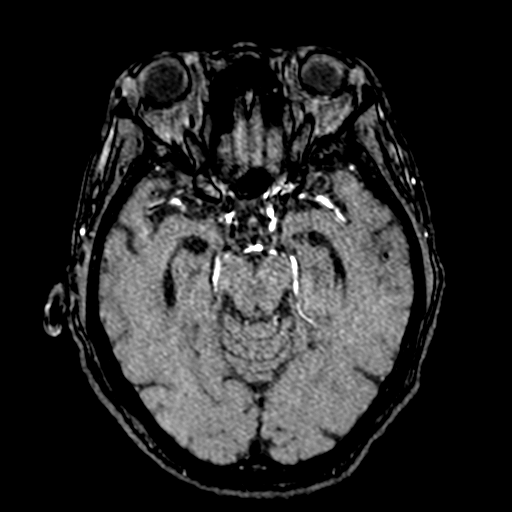
[im 121/172]
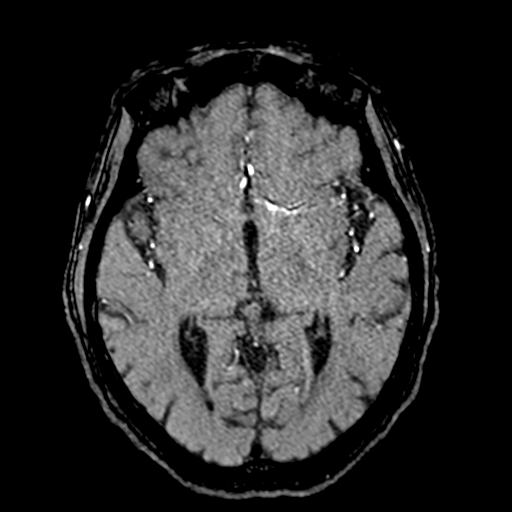
[im 142/172]
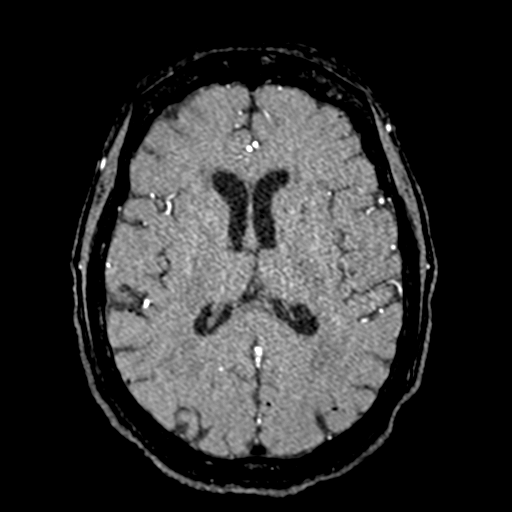
[im 146/172]
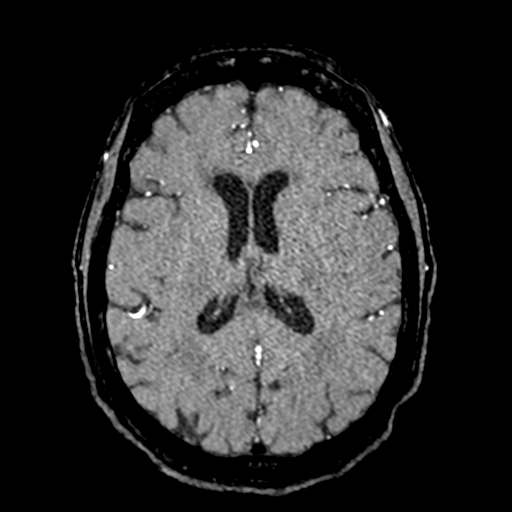
[im 164/172]
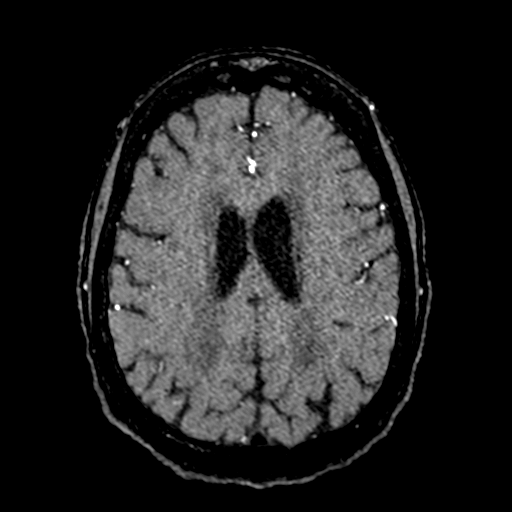

[19 of 48 positions shown; findings below may reference images not displayed]

FINDINGS: MRI HEAD FINDINGS

Brain: No acute infarction, hemorrhage, hydrocephalus, extra-axial
collection or mass lesion. Innumerable peripheral chronic
microhemorrhages with superior left frontal superficial siderosis.
Confluent chronic small vessel ischemia in the cerebral white
matter.

Vascular: Normal flow voids.  MRA below.

Skull and upper cervical spine: Negative

Sinuses/Orbits: Negative

MRA HEAD FINDINGS

The carotid, vertebral, and basilar arteries are patent although
severely affected by extensive irregularity from atheromatous
disease. There are advanced mid basilar and tandem right ICA
stenoses. Extensive branch vessel narrowing with worse narrowing at
the right P2 segment. Fenestrated appearance to the left M1 segment.
Hypoplastic right A1 segment. Negative for aneurysm.
IMPRESSION: Brain MRI:

1. No emergent finding.
2. Findings of amyloid angiopathy.

Intracranial MRA:

1. No emergent finding.
2. Severe and widespread atheromatous disease with high-grade
narrowings in both anterior and posterior circulations.

## 2020-09-04 IMAGING — MR MR HEAD W/O CM
12 of 13 series · 44 of 48 positions shown · non-contrast
Comparison: No pertinent prior exam.
COMPARISON: [DATE]

CLINICAL DATA: Dizziness for the past few days.  Dementia

EXAM:
MRI HEAD WITHOUT CONTRAST
MRA HEAD WITHOUT CONTRAST
TECHNIQUE: Multiplanar, multi-echo pulse sequences of the brain and surrounding
structures were acquired without intravenous contrast. Angiographic
images of the Circle of Willis were acquired using MRA technique
without intravenous contrast.

[Series 5: DWI · axial · 3.0mm · 0.88mm/px · z∈[-143,-7]mm · 7 of 96 slices shown (1 of 4)]
[im 1/96]
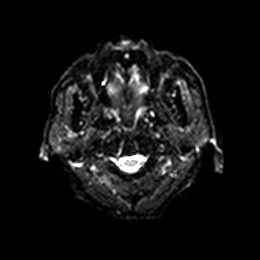
[im 16/96]
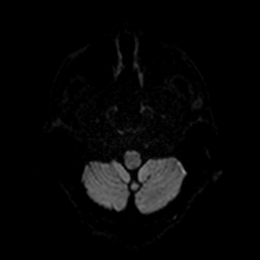
[im 32/96]
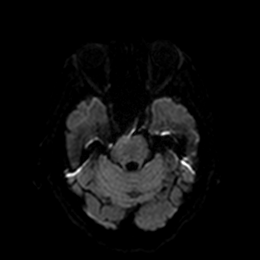
[im 48/96]
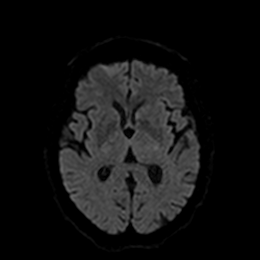
[im 64/96]
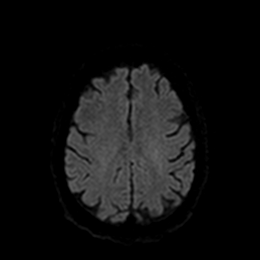
[im 80/96]
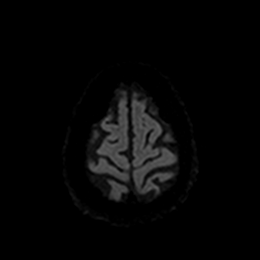
[im 96/96]
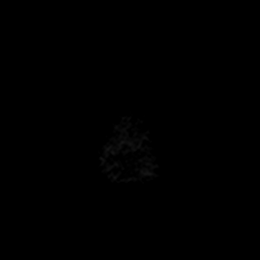

[Series 6: DWI · axial · 3.0mm · 0.88mm/px · z∈[-143,-7]mm · 4 of 47 slices shown (2 of 4)]
[im 1/47]
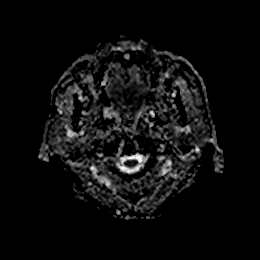
[im 16/47]
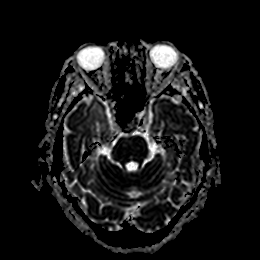
[im 31/47]
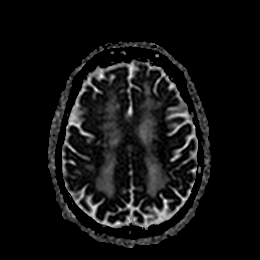
[im 47/47]
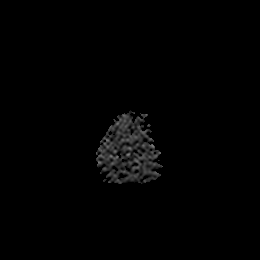

[Series 7: DWI · coronal · 4.0mm · 0.88mm/px · 5 of 64 slices shown (3 of 4)]
[im 1/64]
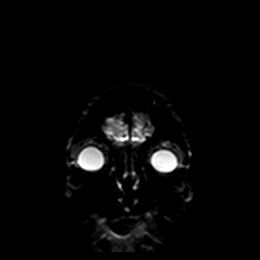
[im 16/64]
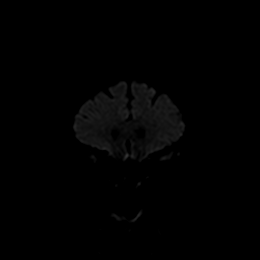
[im 32/64]
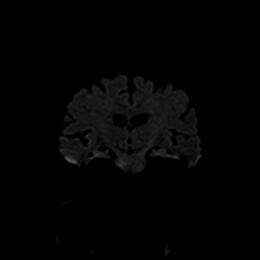
[im 48/64]
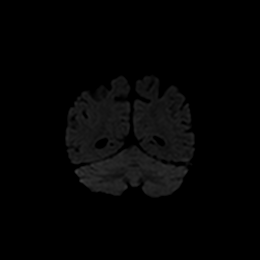
[im 64/64]
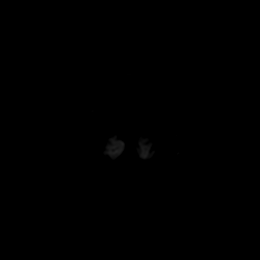

[Series 8: DWI · coronal · 4.0mm · 0.88mm/px · 2 of 32 slices shown (4 of 4)]
[im 1/32]
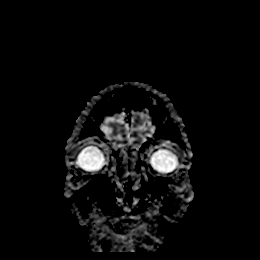
[im 32/32]
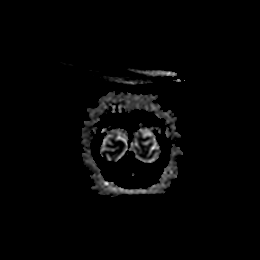

[Series 13: T1 · sagittal · 5.0mm · 0.75mm/px · 2 of 23 slices shown]
[im 1/23]
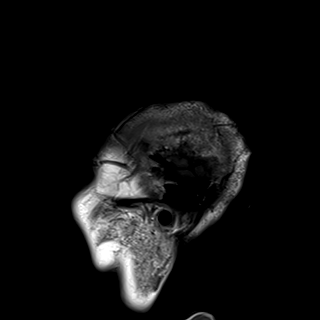
[im 23/23]
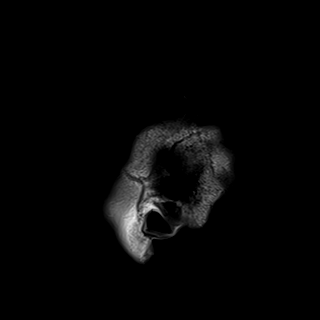

[Series 14: T2 · axial · 5.0mm · 0.72mm/px · z∈[-146,+4]mm · 2 of 27 slices shown (1 of 2)]
[im 1/27]
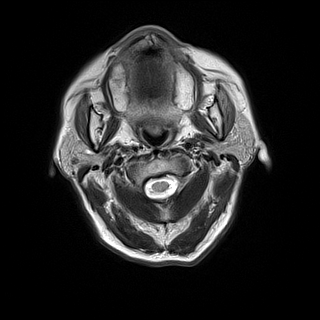
[im 27/27]
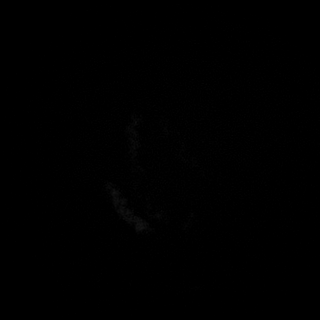

[Series 15: FLAIR · axial · 5.0mm · 0.45mm/px · z∈[-144,+6]mm · 2 of 27 slices shown]
[im 1/27]
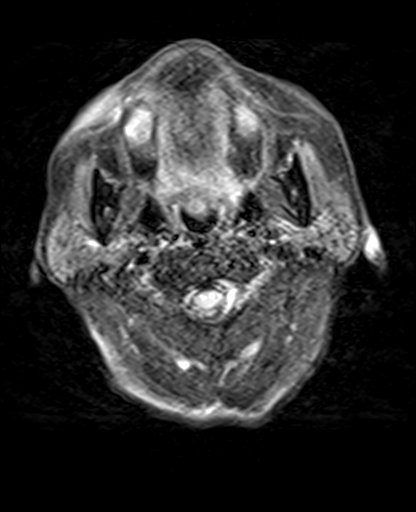
[im 27/27]
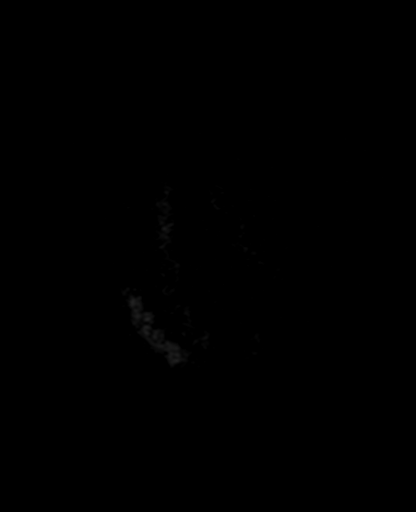

[Series 16: mag_images · axial · 3.0mm · 0.90mm/px · z∈[-159,+11]mm · 5 of 60 slices shown]
[im 1/60]
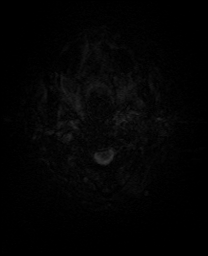
[im 15/60]
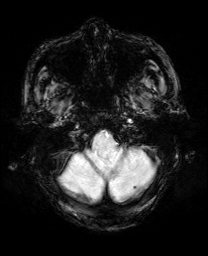
[im 30/60]
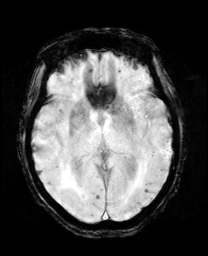
[im 45/60]
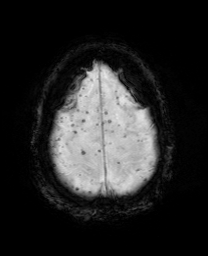
[im 60/60]
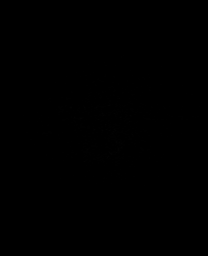

[Series 17: pha_images · axial · 3.0mm · 0.90mm/px · z∈[-159,-0]mm · 4 of 55 slices shown]
[im 1/55]
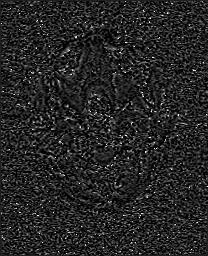
[im 19/55]
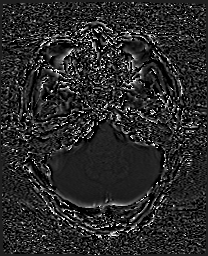
[im 37/55]
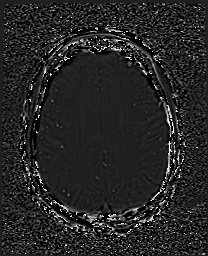
[im 55/55]
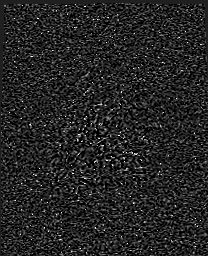

[Series 18: swi_images · axial · 3.0mm · 0.90mm/px · z∈[-159,+11]mm · 5 of 60 slices shown]
[im 1/60]
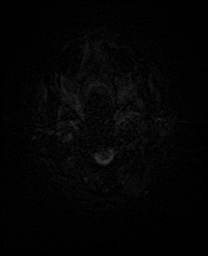
[im 15/60]
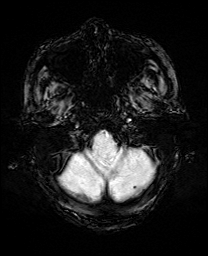
[im 30/60]
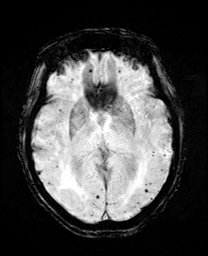
[im 45/60]
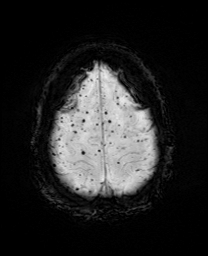
[im 60/60]
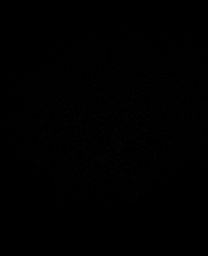

[Series 19: mip_images(sw) · axial · 24.0mm · 0.90mm/px · z∈[-149,+1]mm · 4 of 53 slices shown]
[im 1/53]
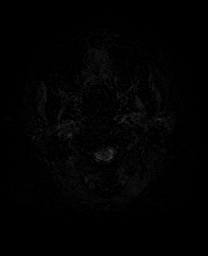
[im 18/53]
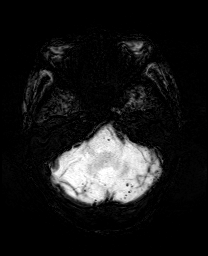
[im 35/53]
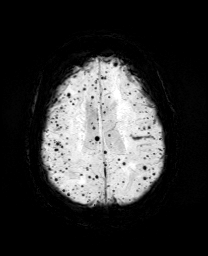
[im 53/53]
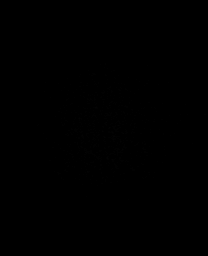

[Series 21: T2 · coronal · 5.0mm · 0.34mm/px · 2 of 29 slices shown (2 of 2)]
[im 1/29]
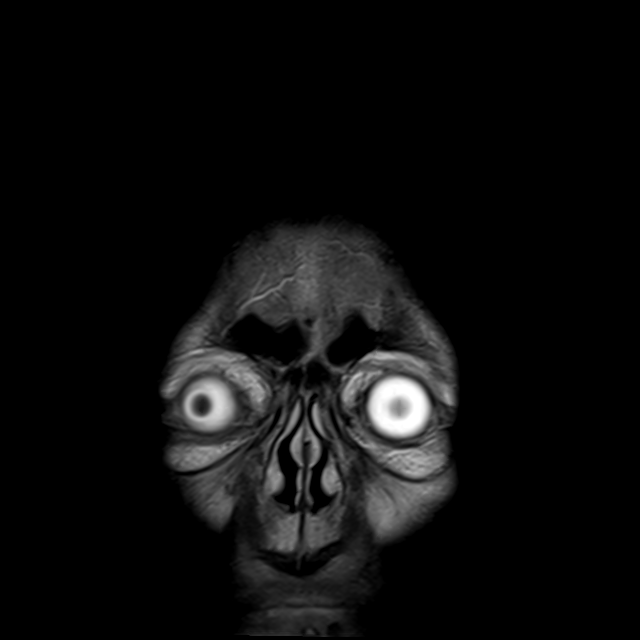
[im 29/29]
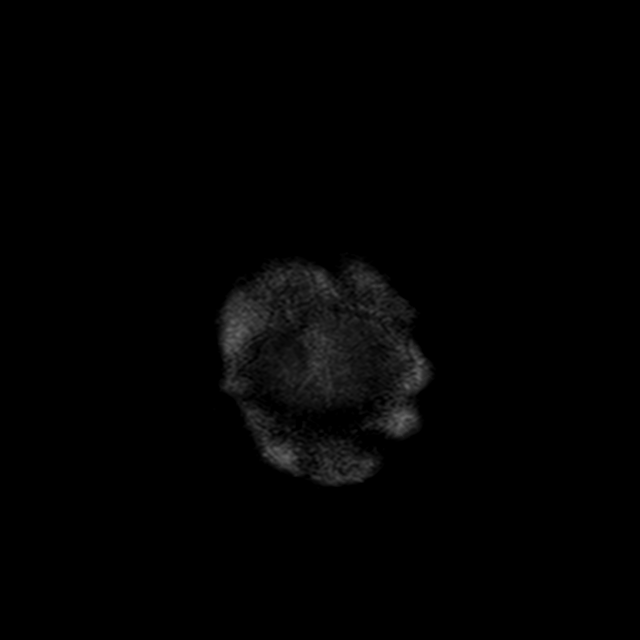

[44 of 48 positions shown; findings below may reference images not displayed]

FINDINGS: MRI HEAD FINDINGS

Brain: No acute infarction, hemorrhage, hydrocephalus, extra-axial
collection or mass lesion. Innumerable peripheral chronic
microhemorrhages with superior left frontal superficial siderosis.
Confluent chronic small vessel ischemia in the cerebral white
matter.

Vascular: Normal flow voids.  MRA below.

Skull and upper cervical spine: Negative

Sinuses/Orbits: Negative

MRA HEAD FINDINGS

The carotid, vertebral, and basilar arteries are patent although
severely affected by extensive irregularity from atheromatous
disease. There are advanced mid basilar and tandem right ICA
stenoses. Extensive branch vessel narrowing with worse narrowing at
the right P2 segment. Fenestrated appearance to the left M1 segment.
Hypoplastic right A1 segment. Negative for aneurysm.
IMPRESSION: Brain MRI:

1. No emergent finding.
2. Findings of amyloid angiopathy.

Intracranial MRA:

1. No emergent finding.
2. Severe and widespread atheromatous disease with high-grade
narrowings in both anterior and posterior circulations.

## 2020-09-04 NOTE — ED Notes (Signed)
Patient transferred from [facility] for MRI for c/o dizziness Patient currently alert

## 2020-09-04 NOTE — ED Notes (Signed)
Patient returned back from MRI.

## 2020-09-04 NOTE — ED Notes (Signed)
Pt ambulated to bathroom without any assistance.  Pt had no difficulty ambulating.

## 2020-09-04 NOTE — ED Notes (Signed)
Patient ambulated to the restroom. Tolerated well.

## 2020-09-04 NOTE — ED Notes (Signed)
Granddaughter called [phone] Miu Chiong states she will becoming to get. Her.

## 2020-09-04 NOTE — ED Provider Notes (Signed)
9:12 AM Patient was transferred from [facility] for MRI.  Has dizziness, especially when getting up to walk for about a week.  Feels both lightheaded and off balance.  She is able to walk however.  Currently pending MRI to rule out CVA.   11:05 AM MRI shows no acute findings.  She has ambulated to the bathroom without difficulty.  Will discharge home   Sherwood Gambler, MD 09/04/20 1130

## 2020-09-06 ENCOUNTER — Ambulatory Visit (INDEPENDENT_AMBULATORY_CARE_PROVIDER_SITE_OTHER): Payer: Medicare HMO

## 2020-09-06 VITALS — Ht 61.0 in | Wt 123.0 lb

## 2020-09-06 DIAGNOSIS — Z0000 Encounter for general adult medical examination without abnormal findings: Secondary | ICD-10-CM | POA: Diagnosis not present

## 2020-09-06 DIAGNOSIS — Z1231 Encounter for screening mammogram for malignant neoplasm of breast: Secondary | ICD-10-CM

## 2020-09-06 NOTE — Addendum Note (Signed)
Addended by: Adalberto Cole E on: 09/06/2020 02:11 PM   Modules accepted: Orders

## 2020-09-06 NOTE — Patient Instructions (Signed)
Erin Jordan , Thank you for taking time to come for your Medicare Wellness Visit. I appreciate your ongoing commitment to your health goals. Please review the following plan we discussed and let me know if I can assist you in the future.   Screening recommendations/referrals: Colonoscopy: Done 05/15/2010 - Repeat not required Mammogram: Done 06/02/2018 - Repeat annually (Ordered today) Bone Density: Done 05/20/2018 - Repeat every 2 years (do this at next visit) Recommended yearly ophthalmology/optometry visit for glaucoma screening and checkup Recommended yearly dental visit for hygiene and checkup  Vaccinations: Influenza vaccine: Done 01/25/2019 - Repeat each fall  Pneumococcal vaccine: Done 03/26/2013 & 06/27/2017 Tdap vaccine: Due (every 10 years) Shingles vaccine: Due Shingrix discussed. Please contact your pharmacy for coverage information.    Covid-19: Due  Conditions/risks identified: Aim for 30 minutes of exercise or brisk walking each day, drink 6-8 glasses of water and eat lots of fruits and vegetables.  Next appointment: Follow up in one year for your annual wellness visit    Preventive Care 65 Years and Older, Female Preventive care refers to lifestyle choices and visits with your health care provider that can promote health and wellness. What does preventive care include?  A yearly physical exam. This is also called an annual well check.  Dental exams once or twice a year.  Routine eye exams. Ask your health care provider how often you should have your eyes checked.  Personal lifestyle choices, including:  Daily care of your teeth and gums.  Regular physical activity.  Eating a healthy diet.  Avoiding tobacco and drug use.  Limiting alcohol use.  Practicing safe sex.  Taking low-dose aspirin every day.  Taking vitamin and mineral supplements as recommended by your health care provider. What happens during an annual well check? The services and screenings  done by your health care provider during your annual well check will depend on your age, overall health, lifestyle risk factors, and family history of disease. Counseling  Your health care provider may ask you questions about your:  Alcohol use.  Tobacco use.  Drug use.  Emotional well-being.  Home and relationship well-being.  Sexual activity.  Eating habits.  History of falls.  Memory and ability to understand (cognition).  Work and work Statistician.  Reproductive health. Screening  You may have the following tests or measurements:  Height, weight, and BMI.  Blood pressure.  Lipid and cholesterol levels. These may be checked every 5 years, or more frequently if you are over 9 years old.  Skin check.  Lung cancer screening. You may have this screening every year starting at age 67 if you have a 30-pack-year history of smoking and currently smoke or have quit within the past 15 years.  Fecal occult blood test (FOBT) of the stool. You may have this test every year starting at age 71.  Flexible sigmoidoscopy or colonoscopy. You may have a sigmoidoscopy every 5 years or a colonoscopy every 10 years starting at age 73.  Hepatitis C blood test.  Hepatitis B blood test.  Sexually transmitted disease (STD) testing.  Diabetes screening. This is done by checking your blood sugar (glucose) after you have not eaten for a while (fasting). You may have this done every 1-3 years.  Bone density scan. This is done to screen for osteoporosis. You may have this done starting at age 42.  Mammogram. This may be done every 1-2 years. Talk to your health care provider about how often you should have regular mammograms. Talk  with your health care provider about your test results, treatment options, and if necessary, the need for more tests. Vaccines  Your health care provider may recommend certain vaccines, such as:  Influenza vaccine. This is recommended every year.  Tetanus,  diphtheria, and acellular pertussis (Tdap, Td) vaccine. You may need a Td booster every 10 years.  Zoster vaccine. You may need this after age 37.  Pneumococcal 13-valent conjugate (PCV13) vaccine. One dose is recommended after age 10.  Pneumococcal polysaccharide (PPSV23) vaccine. One dose is recommended after age 3. Talk to your health care provider about which screenings and vaccines you need and how often you need them. This information is not intended to replace advice given to you by your health care provider. Make sure you discuss any questions you have with your health care provider. Document Released: 04/21/2015 Document Revised: 12/13/2015 Document Reviewed: 01/24/2015 Elsevier Interactive Patient Education  2017 [place] Prevention in the Home Falls can cause injuries. They can happen to people of all ages. There are many things you can do to make your home safe and to help prevent falls. What can I do on the outside of my home?  Regularly fix the edges of walkways and driveways and fix any cracks.  Remove anything that might make you trip as you walk through a door, such as a raised step or threshold.  Trim any bushes or trees on the path to your home.  Use bright outdoor lighting.  Clear any walking paths of anything that might make someone trip, such as rocks or tools.  Regularly check to see if handrails are loose or broken. Make sure that both sides of any steps have handrails.  Any raised decks and porches should have guardrails on the edges.  Have any leaves, snow, or ice cleared regularly.  Use sand or salt on walking paths during winter.  Clean up any spills in your garage right away. This includes oil or grease spills. What can I do in the bathroom?  Use night lights.  Install grab bars by the toilet and in the tub and shower. Do not use towel bars as grab bars.  Use non-skid mats or decals in the tub or shower.  If you need to sit down in  the shower, use a plastic, non-slip stool.  Keep the floor dry. Clean up any water that spills on the floor as soon as it happens.  Remove soap buildup in the tub or shower regularly.  Attach bath mats securely with double-sided non-slip rug tape.  Do not have throw rugs and other things on the floor that can make you trip. What can I do in the bedroom?  Use night lights.  Make sure that you have a light by your bed that is easy to reach.  Do not use any sheets or blankets that are too big for your bed. They should not hang down onto the floor.  Have a firm chair that has side arms. You can use this for support while you get dressed.  Do not have throw rugs and other things on the floor that can make you trip. What can I do in the kitchen?  Clean up any spills right away.  Avoid walking on wet floors.  Keep items that you use a lot in easy-to-reach places.  If you need to reach something above you, use a strong step stool that has a grab bar.  Keep electrical cords out of the way.  Do  not use floor polish or wax that makes floors slippery. If you must use wax, use non-skid floor wax.  Do not have throw rugs and other things on the floor that can make you trip. What can I do with my stairs?  Do not leave any items on the stairs.  Make sure that there are handrails on both sides of the stairs and use them. Fix handrails that are broken or loose. Make sure that handrails are as long as the stairways.  Check any carpeting to make sure that it is firmly attached to the stairs. Fix any carpet that is loose or worn.  Avoid having throw rugs at the top or bottom of the stairs. If you do have throw rugs, attach them to the floor with carpet tape.  Make sure that you have a light switch at the top of the stairs and the bottom of the stairs. If you do not have them, ask someone to add them for you. What else can I do to help prevent falls?  Wear shoes that:  Do not have high  heels.  Have rubber bottoms.  Are comfortable and fit you well.  Are closed at the toe. Do not wear sandals.  If you use a stepladder:  Make sure that it is fully opened. Do not climb a closed stepladder.  Make sure that both sides of the stepladder are locked into place.  Ask someone to hold it for you, if possible.  Clearly mark and make sure that you can see:  Any grab bars or handrails.  First and last steps.  Where the edge of each step is.  Use tools that help you move around (mobility aids) if they are needed. These include:  Canes.  Walkers.  Scooters.  Crutches.  Turn on the lights when you go into a dark area. Replace any light bulbs as soon as they burn out.  Set up your furniture so you have a clear path. Avoid moving your furniture around.  If any of your floors are uneven, fix them.  If there are any pets around you, be aware of where they are.  Review your medicines with your doctor. Some medicines can make you feel dizzy. This can increase your chance of falling. Ask your doctor what other things that you can do to help prevent falls. This information is not intended to replace advice given to you by your health care provider. Make sure you discuss any questions you have with your health care provider. Document Released: 01/19/2009 Document Revised: 08/31/2015 Document Reviewed: 04/29/2014 Elsevier Interactive Patient Education  2017 [employer].

## 2020-09-06 NOTE — Progress Notes (Signed)
Subjective:   Erin Jordan is a 75 y.o. female who presents for an Initial Medicare Annual Wellness Visit.  Virtual Visit via Telephone Note  I connected with  Erin Jordan on 09/06/20 at  1:15 PM EDT by telephone and verified that I am speaking with the correct person using two identifiers.  Location: Patient: Home Provider: [facility] Persons participating in the virtual visit: patient, granddaughter, Erin Jordan Health Advisor   I discussed the limitations, risks, security and privacy concerns of performing an evaluation and management service by telephone and the availability of in person appointments. The patient expressed understanding and agreed to proceed.  Interactive audio and video telecommunications were attempted between this nurse and patient, however failed, due to patient having technical difficulties OR patient did not have access to video capability.  We continued and completed visit with audio only.  Some vital signs may be absent or patient reported.   Erin Lusty E Trena Dunavan, LPN     Review of Systems     Cardiac Risk Factors include: advanced age (>23men, >65 women);dyslipidemia;hypertension;sedentary lifestyle     Objective:    Today's Vitals   09/06/20 1328  Weight: 123 lb (55.8 kg)  Height: 5\' 1"  (1.549 m)  PainSc: 0-No pain   Body mass index is 23.24 kg/m.  Advanced Directives 09/02/2020 07/28/2020 07/19/2020 03/28/2020 10/14/2019 10/14/2019 10/11/2019  Does Patient Have a Medical Advance Directive? No No No No No No No  Would patient like information on creating a medical advance directive? No - Patient declined No - Patient declined - No - Patient declined No - Patient declined - No - Patient declined  Pre-existing out of facility DNR order (yellow form or pink MOST form) - - - - - - -    Current Medications (verified) Outpatient Encounter Medications as of 09/06/2020  Medication Sig  . albuterol (VENTOLIN HFA) 108 (90 Base) MCG/ACT inhaler Inhale 2  puffs into the lungs every 6 (six) hours as needed for wheezing or shortness of breath.  Marland Kitchen amLODipine (NORVASC) 10 MG tablet Take 1 tablet (10 mg total) by mouth daily.  Marland Kitchen aspirin EC 81 MG tablet Take 81 mg by mouth daily. Swallow whole.  Marland Kitchen atorvastatin (LIPITOR) 80 MG tablet TAKE 1 TABLET BY MOUTH ONCE DAILY.  . calcium carbonate (TUMS) 500 MG chewable tablet Chew 2 tablets (400 mg of elemental calcium total) by mouth 3 (three) times daily.  . carvedilol (COREG) 12.5 MG tablet Take 1 tablet (12.5 mg total) by mouth 2 (two) times daily. For BP and Heart  . donepezil (ARICEPT) 10 MG tablet TAKE (1) TABLET BY MOUTH AT BEDTIME.  Marland Kitchen levothyroxine (SYNTHROID) 100 MCG tablet Take 1 tablet (100 mcg total) by mouth daily before breakfast.  . methimazole (TAPAZOLE) 5 MG tablet Take 5 mg by mouth daily.  . nitroGLYCERIN (NITROSTAT) 0.4 MG SL tablet Place 1 tablet (0.4 mg total) under the tongue every 5 (five) minutes x 3 doses as needed for chest pain.  . traZODone (DESYREL) 50 MG tablet Take 1 tablet (50 mg total) by mouth at bedtime.   No facility-administered encounter medications on file as of 09/06/2020.    Allergies (verified) Patient has no known allergies.   History: Past Medical History:  Diagnosis Date  . Arthritis   . Cancer of kidney ([place])   . CKD (chronic kidney disease), stage IV ([place])   . Coronary atherosclerosis of native coronary artery 2006   Multivessel status post CABG in [place], graft disease  documented March 2019 with DES to SVG to diagonal  . Diabetes mellitus without complication ([place])    hx of not currently on meds at preop visit of 07/19/20  . Essential hypertension   . 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 History of diabetes mellitus, type II   . Hyperlipidemia   . Hypothyroidism   . NSTEMI (non-ST elevated myocardial infarction) ([place]) 06/24/2017  . Pneumonia    hx of 2021   . Renal hematoma   . Right renal mass    Past Surgical History:  Procedure  Laterality Date  . ABDOMINAL HYSTERECTOMY    . CORONARY ARTERY BYPASS GRAFT  2006   [place], [place]  . CORONARY STENT INTERVENTION N/A 06/25/2017   Procedure: CORONARY STENT INTERVENTION;  Surgeon: Jettie Booze, MD;  Location: [facility];  Service: Cardiovascular;  Laterality: N/A;  . IR RADIOLOGIST EVAL & MGMT  07/08/2019  . LEFT HEART CATH AND CORS/GRAFTS ANGIOGRAPHY N/A 06/25/2017   Procedure: LEFT HEART CATH AND CORS/GRAFTS ANGIOGRAPHY;  Surgeon: Jettie Booze, MD;  Location: [facility];  Service: Cardiovascular;  Laterality: N/A;  . THYROIDECTOMY N/A 07/28/2020   Procedure: TOTAL THYROIDECTOMY;  Surgeon: Armandina Gemma, MD;  Location: [facility];  Service: General;  Laterality: N/A;  2 HOURS   Family History  Problem Relation Age of Onset  . Heart attack Mother   . Hypertension Mother   . Seizures Son   . Seizures Son   . Seizures Daughter    Social History   Socioeconomic History  . Marital status: Widowed    Spouse name: Not on file  . Number of children: 6  . Years of education: Not on file  . Highest education level: Not on file  Occupational History  . Occupation: retired  Tobacco Use  . Smoking status: Never Smoker  . Smokeless tobacco: Never Used  Vaping Use  . Vaping Use: Never used  Substance and Sexual Activity  . Alcohol use: No  . Drug use: No  . Sexual activity: Never  Other Topics Concern  . Not on file  Social History Narrative   Lives in [place] with her daughter and grandchildren.   She has dementia   Social Determinants of Health   Financial Resource Strain: Medium Risk  . Difficulty of Paying Living Expenses: Somewhat hard  Food Insecurity: No Food Insecurity  . Worried About Charity fundraiser in the Last Year: Never true  . Ran Out of Food in the Last Year: Never true  Transportation Needs: No Transportation Needs  . Lack of Transportation (Medical): No  . Lack of Transportation (Non-Medical): No  Physical  Activity: Insufficiently Active  . Days of Exercise per Week: 7 days  . Minutes of Exercise per Session: 20 min  Stress: No Stress Concern Present  . Feeling of Stress : Only a little  Social Connections: Moderately Isolated  . Frequency of Communication with Friends and Family: More than three times a week  . Frequency of Social Gatherings with Friends and Family: More than three times a week  . Attends Religious Services: 1 to 4 times per year  . Active Member of Clubs or Organizations: No  . Attends Archivist Meetings: Never  . Marital Status: Widowed    Tobacco Counseling Counseling given: Not Answered   Clinical Intake:  Pre-visit preparation completed: Yes  Pain : No/denies pain Pain Score: 0-No pain     BMI - recorded: 23.24 Nutritional Status: BMI  of 19-24  Normal Nutritional Risks: Unintentional weight loss Diabetes: No  How often do you need to have someone help you when you read instructions, pamphlets, or other written materials from your doctor or pharmacy?: 1 - Never  Diabetic? No  Interpreter Needed?: No  Information entered by :: Graham Hyun, LPN   Activities of Daily Living In your present state of health, do you have any difficulty performing the following activities: 09/06/2020 07/19/2020  Hearing? N N  Vision? N N  Difficulty concentrating or making decisions? Tempie Donning  Walking or climbing stairs? N N  Dressing or bathing? N N  Doing errands, shopping? N N  Preparing Food and eating ? N -  Using the Toilet? N -  In the past six months, have you accidently leaked urine? N -  Do you have problems with loss of bowel control? N -  Managing your Medications? Y -  Managing your Finances? Y -  Housekeeping or managing your Housekeeping? N -  Some recent data might be hidden    Patient Care Team: [name], FNP as PCP - General (Family Medicine) Donetta Potts, RN as Oncology Nurse Navigator Derek Jack, MD as Consulting  Physician (Hematology) Satira Sark, MD as Consulting Physician (Cardiology) Cassandria Anger, MD as Consulting Physician (Endocrinology) Alyson Ingles Candee Furbish, MD as Consulting Physician (Urology)  Indicate any recent Medical Services you may have received from other than [facility] providers in the past year (date may be approximate).     Assessment:   This is a routine wellness examination for Francheska.  Hearing/Vision screen  Hearing Screening   125Hz  250Hz  500Hz  1000Hz  2000Hz  3000Hz  4000Hz  6000Hz  8000Hz   Right ear:           Left ear:           Comments: Denies hearing difficulties   Vision Screening Comments: Wears eyeglasses - needs cataract surgery - has postponed until health is better - up to date with eye exams at [facility] in [place]   Dietary issues and exercise activities discussed: Current Exercise Habits: Home exercise routine, Type of exercise: walking, Time (Minutes): 20, Frequency (Times/Week): 7, Weekly Exercise (Minutes/Week): 140, Intensity: Mild, Exercise limited by: cardiac condition(s)  Goals Addressed            This Visit's Progress   . Exercise 3x per week (30 min per time)        Depression Screen PHQ 2/9 Scores 09/06/2020 04/19/2020 08/19/2019 03/24/2019 01/25/2019 10/14/2017  PHQ - 2 Score 2 0 0 0 0 0  PHQ- 9 Score 9 - - - 0 -    Fall Risk Fall Risk  04/19/2020 08/19/2019 03/24/2019 01/25/2019 10/14/2017  Falls in the past year? 0 0 0 1 No  Number falls in past yr: - - - 0 -  Injury with Fall? - - - 0 -    FALL RISK PREVENTION PERTAINING TO THE HOME:  Any stairs in or around the home? Yes  If so, are there any without handrails? No  Home free of loose throw rugs in walkways, pet beds, electrical cords, etc? Yes  Adequate lighting in your home to reduce risk of falls? Yes   ASSISTIVE DEVICES UTILIZED TO PREVENT FALLS:  Life alert? No  Use of a cane, walker or w/c? No  Grab bars in the bathroom? Yes  Shower chair or bench in shower? Yes   Elevated toilet seat or a handicapped toilet? Yes   TIMED UP AND  GO:  Was the test performed? No . Telephonic visit  Cognitive Function: Cognitive status assessed by direct observation. Patient has current diagnosis of cognitive impairment. Patient is unable to complete screening 6CIT or MMSE.         Immunizations Immunization History  Administered Date(s) Administered  . Fluad Quad(high Dose 65+) 01/25/2019  . Influenza Split 06/03/2011  . Influenza, High Dose Seasonal PF 06/27/2017  . Pneumococcal Conjugate-13 03/26/2013  . Pneumococcal Polysaccharide-23 11/03/2007, 02/14/2015, 06/27/2017    TDAP status: Due, Education has been provided regarding the importance of this vaccine. Advised may receive this vaccine at local pharmacy or Health Dept. Aware to provide a copy of the vaccination record if obtained from local pharmacy or Health Dept. Verbalized acceptance and understanding.  Flu Vaccine status: Due, Education has been provided regarding the importance of this vaccine. Advised may receive this vaccine at local pharmacy or Health Dept. Aware to provide a copy of the vaccination record if obtained from local pharmacy or Health Dept. Verbalized acceptance and understanding.  Pneumococcal vaccine status: Up to date  Covid-19 vaccine status: Declined, Education has been provided regarding the importance of this vaccine but patient still declined. Advised may receive this vaccine at local pharmacy or Health Dept.or vaccine clinic. Aware to provide a copy of the vaccination record if obtained from local pharmacy or Health Dept. Verbalized acceptance and understanding.  Qualifies for Shingles Vaccine? Yes   Zostavax completed No   Shingrix Completed?: No.    Education has been provided regarding the importance of this vaccine. Patient has been advised to call insurance company to determine out of pocket expense if they have not yet received this vaccine. Advised may also receive  vaccine at local pharmacy or Health Dept. Verbalized acceptance and understanding.  Screening Tests Health Maintenance  Topic Date Due  . COVID-19 Vaccine (1) Never done  . TETANUS/TDAP  Never done  . Zoster Vaccines- Shingrix (1 of 2) Never done  . DEXA SCAN  05/20/2020  . INFLUENZA VACCINE  11/06/2020  . URINE MICROALBUMIN  01/20/2021  . Hepatitis C Screening  Completed  . PNA vac Low Risk Adult  Completed  . HPV VACCINES  Aged Out    Health Maintenance  Health Maintenance Due  Topic Date Due  . COVID-19 Vaccine (1) Never done  . TETANUS/TDAP  Never done  . Zoster Vaccines- Shingrix (1 of 2) Never done  . DEXA SCAN  05/20/2020    Colorectal cancer screening: No longer required.   Mammogram status: Ordered 09/06/20. Pt provided with contact info and advised to call to schedule appt.   Bone Density status: Completed 05/20/2018. Results reflect: Bone density results: OSTEOPENIA. Repeat every 2 years.  Lung Cancer Screening: (Low Dose CT Chest recommended if Age 29-80 years, 30 pack-year currently smoking OR have quit w/in 15years.) does not qualify.   Additional Screening:  Hepatitis C Screening: does qualify; Completed 01/27/2019  Vision Screening: Recommended annual ophthalmology exams for early detection of glaucoma and other disorders of the eye. Is the patient up to date with their annual eye exam?  Yes  Who is the provider or what is the name of the office in which the patient attends annual eye exams? [facility] If pt is not established with a provider, would they like to be referred to a provider to establish care? No .   Dental Screening: Recommended annual dental exams for proper oral hygiene  Community Resource Referral / Chronic Care Management: CRR required this  visit?  No   CCM required this visit?  No      Plan:     I have personally reviewed and noted the following in the patient's chart:   . Medical and social history . Use of  alcohol, tobacco or illicit drugs  . Current medications and supplements including opioid prescriptions. Patient is not currently taking opioid prescriptions. . Functional ability and status . Nutritional status . Physical activity . Advanced directives . List of other physicians . Hospitalizations, surgeries, and ER visits in previous 12 months . Vitals . Screenings to include cognitive, depression, and falls . Referrals and appointments  In addition, I have reviewed and discussed with patient certain preventive protocols, quality metrics, and best practice recommendations. A written personalized care plan for preventive services as well as general preventive health recommendations were provided to patient.     Sandrea Hammond, LPN   [id number]   Nurse Notes: PHQ-9 abnormal score - her granddaughter, [name] says she sleep a lot more, acts like she doesn't want to get out and do things like she used to, and says she "wants to go home" a lot, so she's worried about her.

## 2020-09-22 ENCOUNTER — Ambulatory Visit: Payer: Medicare HMO | Admitting: Family Medicine

## 2020-09-22 ENCOUNTER — Encounter: Payer: Self-pay | Admitting: Family Medicine

## 2020-09-25 ENCOUNTER — Other Ambulatory Visit: Payer: Self-pay | Admitting: Family Medicine

## 2020-09-27 ENCOUNTER — Ambulatory Visit (INDEPENDENT_AMBULATORY_CARE_PROVIDER_SITE_OTHER): Payer: Medicare HMO | Admitting: Student

## 2020-09-27 ENCOUNTER — Encounter: Payer: Self-pay | Admitting: Student

## 2020-09-27 ENCOUNTER — Other Ambulatory Visit: Payer: Self-pay

## 2020-09-27 ENCOUNTER — Other Ambulatory Visit: Payer: Self-pay | Admitting: Family Medicine

## 2020-09-27 VITALS — BP 142/72 | HR 70 | Ht 66.0 in | Wt 115.0 lb

## 2020-09-27 DIAGNOSIS — I10 Essential (primary) hypertension: Secondary | ICD-10-CM | POA: Diagnosis not present

## 2020-09-27 DIAGNOSIS — N184 Chronic kidney disease, stage 4 (severe): Secondary | ICD-10-CM | POA: Diagnosis not present

## 2020-09-27 DIAGNOSIS — E785 Hyperlipidemia, unspecified: Secondary | ICD-10-CM | POA: Diagnosis not present

## 2020-09-27 DIAGNOSIS — I2510 Atherosclerotic heart disease of native coronary artery without angina pectoris: Secondary | ICD-10-CM

## 2020-09-27 NOTE — Patient Instructions (Signed)
Medication Instructions:  Your physician recommends that you continue on your current medications as directed. Please refer to the Current Medication list given to you today.  *If you need a refill on your cardiac medications before your next appointment, please call your pharmacy*   Lab Work: None If you have labs (blood work) drawn today and your tests are completely normal, you will receive your results only by: [place] (if you have MyChart) OR A paper copy in the mail If you have any lab test that is abnormal or we need to change your treatment, we will call you to review the results.   Testing/Procedures: None   Follow-Up: At [facility], you and your health needs are our priority.  As part of our continuing mission to provide you with exceptional heart care, we have created designated Provider Care Teams.  These Care Teams include your primary Cardiologist (physician) and Advanced Practice Providers (APPs -  Physician Assistants and Nurse Practitioners) who all work together to provide you with the care you need, when you need it.  We recommend signing up for the patient portal called "MyChart".  Sign up information is provided on this After Visit Summary.  MyChart is used to connect with patients for Virtual Visits (Telemedicine).  Patients are able to view lab/test results, encounter notes, upcoming appointments, etc.  Non-urgent messages can be sent to your provider as well.   To learn more about what you can do with MyChart, go to [email or web address].    Your next appointment:   12 month(s)  The format for your next appointment:   In Person  Provider:   In [place] with Dr. Myles Gip   Other Instructions
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
## 2020-10-04 ENCOUNTER — Ambulatory Visit: Payer: Medicare HMO | Admitting: Family Medicine

## 2020-10-05 ENCOUNTER — Inpatient Hospital Stay (HOSPITAL_COMMUNITY): Admission: RE | Admit: 2020-10-05 | Payer: Medicare HMO | Source: Ambulatory Visit

## 2020-10-13 ENCOUNTER — Other Ambulatory Visit: Payer: Self-pay

## 2020-10-13 ENCOUNTER — Ambulatory Visit (INDEPENDENT_AMBULATORY_CARE_PROVIDER_SITE_OTHER): Payer: Medicare HMO | Admitting: Family Medicine

## 2020-10-13 ENCOUNTER — Encounter: Payer: Self-pay | Admitting: Family Medicine

## 2020-10-13 VITALS — BP 134/70 | HR 69 | Temp 97.6°F | Ht 66.0 in | Wt 112.4 lb

## 2020-10-13 DIAGNOSIS — E890 Postprocedural hypothyroidism: Secondary | ICD-10-CM

## 2020-10-13 DIAGNOSIS — I2510 Atherosclerotic heart disease of native coronary artery without angina pectoris: Secondary | ICD-10-CM

## 2020-10-13 DIAGNOSIS — E785 Hyperlipidemia, unspecified: Secondary | ICD-10-CM | POA: Diagnosis not present

## 2020-10-13 DIAGNOSIS — Z9861 Coronary angioplasty status: Secondary | ICD-10-CM

## 2020-10-13 DIAGNOSIS — I10 Essential (primary) hypertension: Secondary | ICD-10-CM

## 2020-10-13 DIAGNOSIS — F0390 Unspecified dementia without behavioral disturbance: Secondary | ICD-10-CM

## 2020-10-13 DIAGNOSIS — N184 Chronic kidney disease, stage 4 (severe): Secondary | ICD-10-CM

## 2020-10-13 MED ORDER — ASPIRIN EC 81 MG PO TBEC: 81.0000 mg | DELAYED_RELEASE_TABLET | Freq: Every day | ORAL | 5 refills | Status: DC

## 2020-10-13 MED ORDER — CARVEDILOL 6.25 MG PO TABS: 6.2500 mg | ORAL_TABLET | Freq: Two times a day (BID) | ORAL | 5 refills | Status: DC

## 2020-10-13 MED ORDER — DONEPEZIL HCL 10 MG PO TABS: ORAL_TABLET | ORAL | 5 refills | Status: DC

## 2020-10-13 MED ORDER — AMLODIPINE BESYLATE 10 MG PO TABS: 10.0000 mg | ORAL_TABLET | Freq: Every day | ORAL | 5 refills | Status: DC
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
## 2020-10-14 LAB — CMP14+EGFR
ALT: 14 IU/L (ref 0–32)
AST: 17 IU/L (ref 0–40)
Albumin/Globulin Ratio: 1.8 (ref 1.2–2.2)
Albumin: 4.6 g/dL (ref 3.7–4.7)
Alkaline Phosphatase: 158 IU/L — ABNORMAL HIGH (ref 44–121)
BUN/Creatinine Ratio: 14 (ref 12–28)
BUN: 32 mg/dL — ABNORMAL HIGH (ref 8–27)
Bilirubin Total: 0.4 mg/dL (ref 0.0–1.2)
CO2: 20 mmol/L (ref 20–29)
Calcium: 9.7 mg/dL (ref 8.7–10.3)
Chloride: 102 mmol/L (ref 96–106)
Creatinine, Ser: 2.34 mg/dL — ABNORMAL HIGH (ref 0.57–1.00)
Globulin, Total: 2.6 g/dL (ref 1.5–4.5)
Glucose: 138 mg/dL — ABNORMAL HIGH (ref 65–99)
Potassium: 4.3 mmol/L (ref 3.5–5.2)
Sodium: 142 mmol/L (ref 134–144)
Total Protein: 7.2 g/dL (ref 6.0–8.5)
eGFR: 21 mL/min/{1.73_m2} — ABNORMAL LOW (ref >59)

## 2020-10-14 LAB — LIPID PANEL
Chol/HDL Ratio: 2.7 ratio (ref 0.0–4.4)
Cholesterol, Total: 264 mg/dL — ABNORMAL HIGH (ref 100–199)
HDL: 98 mg/dL (ref >39)
LDL Chol Calc (NIH): 154 mg/dL — ABNORMAL HIGH (ref 0–99)
Triglycerides: 76 mg/dL (ref 0–149)
VLDL Cholesterol Cal: 12 mg/dL (ref 5–40)

## 2020-10-14 LAB — CBC WITH DIFFERENTIAL/PLATELET
Basophils Absolute: 0 10*3/uL (ref 0.0–0.2)
Basos: 1 %
EOS (ABSOLUTE): 0.1 10*3/uL (ref 0.0–0.4)
Eos: 2 %
Hematocrit: 38.8 % (ref 34.0–46.6)
Hemoglobin: 13.3 g/dL (ref 11.1–15.9)
Immature Grans (Abs): 0 10*3/uL (ref 0.0–0.1)
Immature Granulocytes: 0 %
Lymphocytes Absolute: 1.3 10*3/uL (ref 0.7–3.1)
Lymphs: 23 %
MCH: 30 pg (ref 26.6–33.0)
MCHC: 34.3 g/dL (ref 31.5–35.7)
MCV: 88 fL (ref 79–97)
Monocytes Absolute: 0.5 10*3/uL (ref 0.1–0.9)
Monocytes: 9 %
Neutrophils Absolute: 3.7 10*3/uL (ref 1.4–7.0)
Neutrophils: 65 %
Platelets: 228 10*3/uL (ref 150–450)
RBC: 4.43 x10E6/uL (ref 3.77–5.28)
RDW: 12.5 % (ref 11.7–15.4)
WBC: 5.7 10*3/uL (ref 3.4–10.8)

## 2020-10-14 LAB — T4, FREE: Free T4: 1.68 ng/dL (ref 0.82–1.77)

## 2020-10-14 LAB — TSH: TSH: 5.5 u[IU]/mL — ABNORMAL HIGH (ref 0.450–4.500)

## 2020-10-29 ENCOUNTER — Other Ambulatory Visit: Payer: Self-pay

## 2020-10-29 ENCOUNTER — Emergency Department (HOSPITAL_COMMUNITY): Payer: Medicare HMO

## 2020-10-29 ENCOUNTER — Emergency Department (HOSPITAL_COMMUNITY)
Admission: EM | Admit: 2020-10-29 | Discharge: 2020-10-29 | Disposition: A | Discharge status: Home / Self Care | Payer: Medicare HMO | Attending: Emergency Medicine | Admitting: Emergency Medicine

## 2020-10-29 ENCOUNTER — Encounter (HOSPITAL_COMMUNITY): Payer: Self-pay | Admitting: Emergency Medicine

## 2020-10-29 DIAGNOSIS — I129 Hypertensive chronic kidney disease with stage 1 through stage 4 chronic kidney disease, or unspecified chronic kidney disease: Secondary | ICD-10-CM | POA: Diagnosis not present

## 2020-10-29 DIAGNOSIS — E039 Hypothyroidism, unspecified: Secondary | ICD-10-CM | POA: Diagnosis not present

## 2020-10-29 DIAGNOSIS — Z79899 Other long term (current) drug therapy: Secondary | ICD-10-CM | POA: Diagnosis not present

## 2020-10-29 DIAGNOSIS — Z20822 Contact with and (suspected) exposure to covid-19: Secondary | ICD-10-CM | POA: Insufficient documentation

## 2020-10-29 DIAGNOSIS — F01518 Vascular dementia, unspecified severity, with other behavioral disturbance: Secondary | ICD-10-CM

## 2020-10-29 DIAGNOSIS — Z7982 Long term (current) use of aspirin: Secondary | ICD-10-CM | POA: Diagnosis not present

## 2020-10-29 DIAGNOSIS — Z85528 Personal history of other malignant neoplasm of kidney: Secondary | ICD-10-CM | POA: Diagnosis not present

## 2020-10-29 DIAGNOSIS — Z951 Presence of aortocoronary bypass graft: Secondary | ICD-10-CM | POA: Diagnosis not present

## 2020-10-29 DIAGNOSIS — F0151 Vascular dementia with behavioral disturbance: Secondary | ICD-10-CM | POA: Insufficient documentation

## 2020-10-29 DIAGNOSIS — N184 Chronic kidney disease, stage 4 (severe): Secondary | ICD-10-CM | POA: Diagnosis not present

## 2020-10-29 DIAGNOSIS — I2510 Atherosclerotic heart disease of native coronary artery without angina pectoris: Secondary | ICD-10-CM | POA: Insufficient documentation

## 2020-10-29 DIAGNOSIS — R4182 Altered mental status, unspecified: Secondary | ICD-10-CM | POA: Diagnosis present

## 2020-10-29 LAB — COMPREHENSIVE METABOLIC PANEL
ALT: 12 U/L (ref 0–44)
AST: 17 U/L (ref 15–41)
Albumin: 3.6 g/dL (ref 3.5–5.0)
Alkaline Phosphatase: 115 U/L (ref 38–126)
Anion gap: 5 (ref 5–15)
BUN: 47 mg/dL — ABNORMAL HIGH (ref 8–23)
CO2: 25 mmol/L (ref 22–32)
Calcium: 8.6 mg/dL — ABNORMAL LOW (ref 8.9–10.3)
Chloride: 106 mmol/L (ref 98–111)
Creatinine, Ser: 3.07 mg/dL — ABNORMAL HIGH (ref 0.44–1.00)
GFR, Estimated: 15 mL/min — ABNORMAL LOW (ref >60)
Glucose, Bld: 153 mg/dL — ABNORMAL HIGH (ref 70–99)
Potassium: 3.4 mmol/L — ABNORMAL LOW (ref 3.5–5.1)
Sodium: 136 mmol/L (ref 135–145)
Total Bilirubin: 0.5 mg/dL (ref 0.3–1.2)
Total Protein: 6.7 g/dL (ref 6.5–8.1)

## 2020-10-29 LAB — APTT: aPTT: 24 seconds (ref 24–36)

## 2020-10-29 LAB — URINALYSIS, ROUTINE W REFLEX MICROSCOPIC
Bilirubin Urine: NEGATIVE
Glucose, UA: NEGATIVE mg/dL
Ketones, ur: NEGATIVE mg/dL
Nitrite: NEGATIVE
Protein, ur: >=300 mg/dL — AB
Specific Gravity, Urine: 1.01 (ref 1.005–1.030)
pH: 6 (ref 5.0–8.0)

## 2020-10-29 LAB — CBC
HCT: 34.4 % — ABNORMAL LOW (ref 36.0–46.0)
Hemoglobin: 12.1 g/dL (ref 12.0–15.0)
MCH: 31.2 pg (ref 26.0–34.0)
MCHC: 35.2 g/dL (ref 30.0–36.0)
MCV: 88.7 fL (ref 80.0–100.0)
Platelets: 220 10*3/uL (ref 150–400)
RBC: 3.88 MIL/uL (ref 3.87–5.11)
RDW: 12.7 % (ref 11.5–15.5)
WBC: 6.1 10*3/uL (ref 4.0–10.5)
nRBC: 0 % (ref 0.0–0.2)

## 2020-10-29 LAB — RESP PANEL BY RT-PCR (FLU A&B, COVID) ARPGX2
Influenza A by PCR: NEGATIVE
Influenza B by PCR: NEGATIVE
SARS Coronavirus 2 by RT PCR: NEGATIVE

## 2020-10-29 LAB — PROTIME-INR
INR: 1.1 (ref 0.8–1.2)
Prothrombin Time: 14.2 seconds (ref 11.4–15.2)

## 2020-10-29 LAB — LACTIC ACID, PLASMA: Lactic Acid, Venous: 1.1 mmol/L (ref 0.5–1.9)

## 2020-10-29 IMAGING — DX DG CHEST 1V PORT
1 series · 1 of 1 positions shown · non-contrast
Comparison: [DATE]

CLINICAL DATA: Questionable sepsis. Altered mental status, fever,
and cough.

EXAM:
PORTABLE CHEST 1 VIEW

[chest ap]
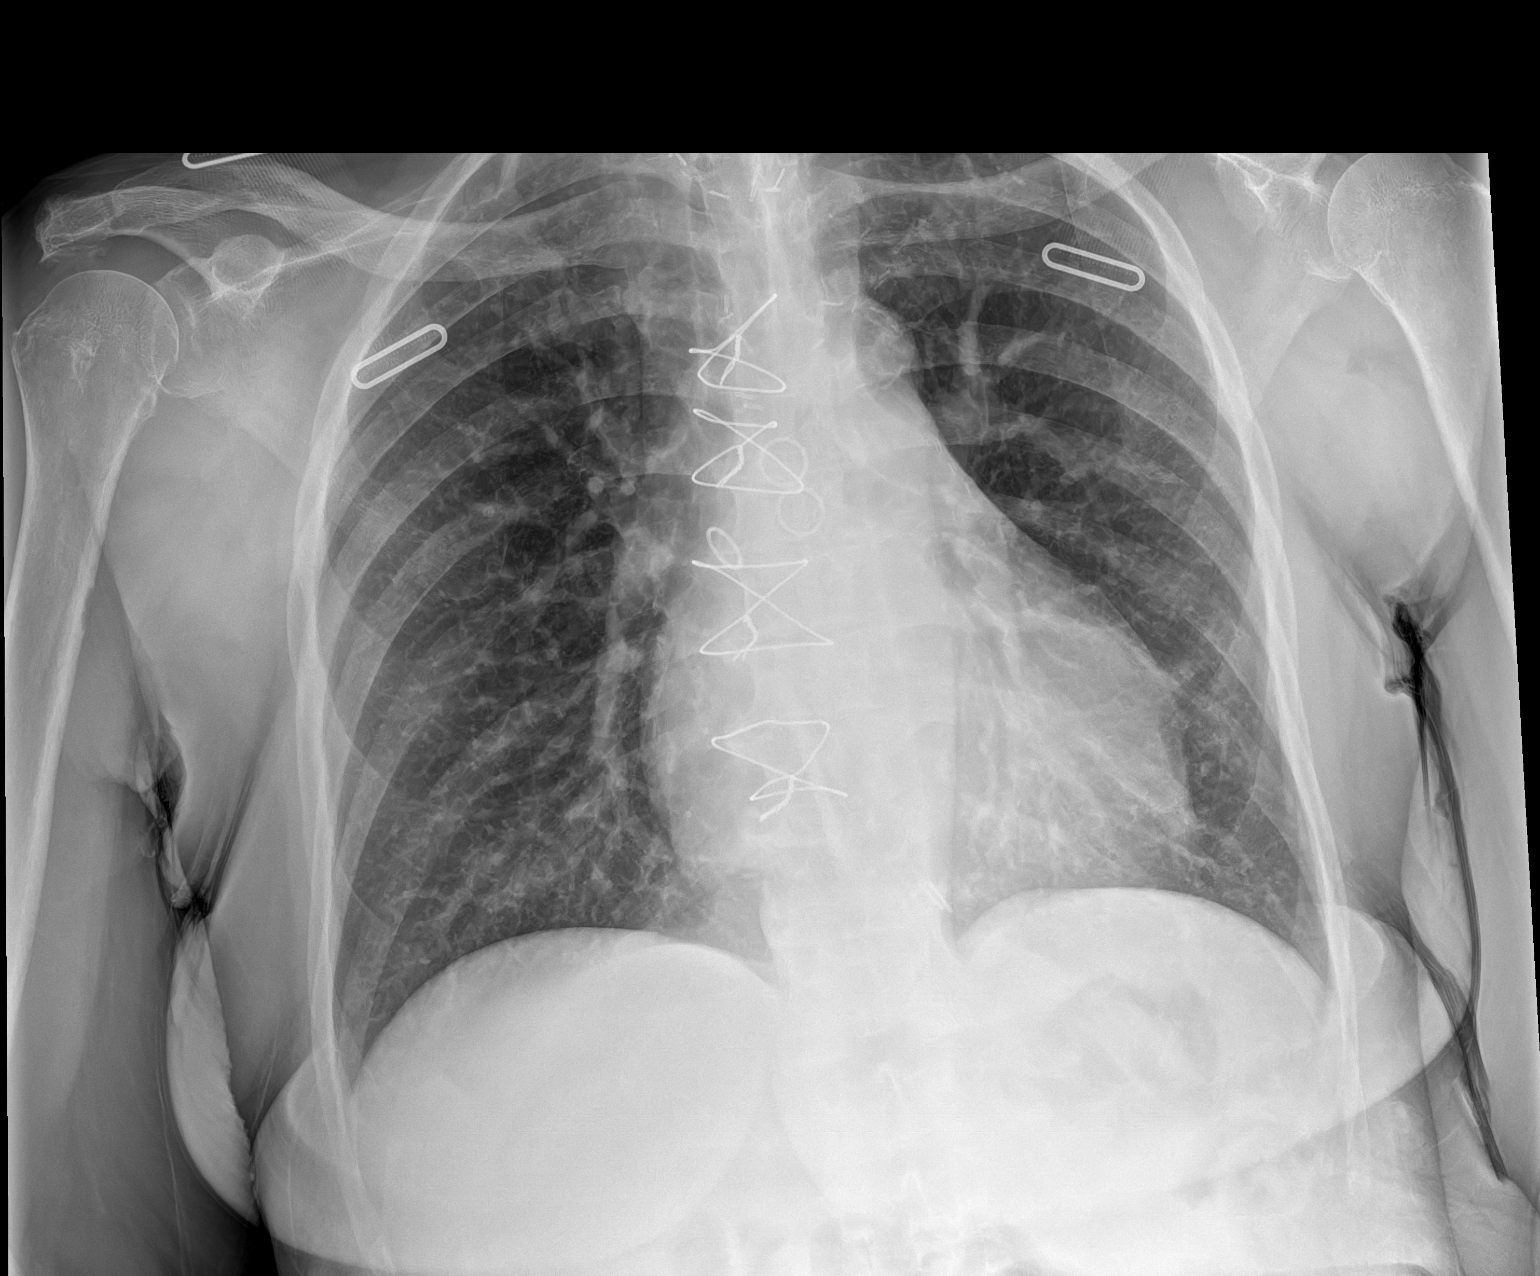

[1 of 1 positions shown; findings below may reference images not displayed]

FINDINGS: Postoperative changes in the mediastinum and base of neck. Heart
size and pulmonary vascularity are normal. No airspace disease or
consolidation in the lungs. No pleural effusions. No pneumothorax.
Mediastinal contours appear intact. Degenerative changes in the
spine and shoulders.
IMPRESSION: No active disease.

## 2020-10-29 MED ORDER — SODIUM CHLORIDE 0.9 % IV BOLUS
500.0000 mL | Freq: Once | INTRAVENOUS | Status: AC
  Administered 2020-10-29: 500 mL via INTRAVENOUS
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
## 2020-10-29 NOTE — ED Notes (Signed)
Pt refusing to allow vital signs to be gathered at this time. EDP notified. Pts son now at bedside and updated on Pt status. Pts son reports Pt is baseline altered with dementia.
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
## 2020-10-29 NOTE — ED Notes (Signed)
Pt refused IV Fluids at this time. Pt refusing to provide urine sample. EDP Notified. Pt confused at this time. Pt observed walking around carrying home tv remote control stating it was her phone and she "takes it everywhere" with her.

## 2020-10-29 NOTE — Discharge Instructions (Addendum)
Follow-up with your family doctor this week for recheck 

## 2020-10-29 NOTE — ED Notes (Signed)
X-ray at bedside

## 2020-10-31 LAB — URINE CULTURE

## 2020-11-03 LAB — CULTURE, BLOOD (SINGLE)
Culture: NO GROWTH
Special Requests: ADEQUATE

## 2020-11-07 ENCOUNTER — Encounter (HOSPITAL_COMMUNITY): Payer: Self-pay | Admitting: Emergency Medicine

## 2020-11-07 ENCOUNTER — Other Ambulatory Visit: Payer: Self-pay

## 2020-11-07 ENCOUNTER — Emergency Department (HOSPITAL_COMMUNITY)
Admission: EM | Admit: 2020-11-07 | Discharge: 2020-11-08 | Disposition: A | Discharge status: Home / Self Care | Payer: Medicare HMO | Attending: Emergency Medicine | Admitting: Emergency Medicine

## 2020-11-07 DIAGNOSIS — I2510 Atherosclerotic heart disease of native coronary artery without angina pectoris: Secondary | ICD-10-CM | POA: Diagnosis not present

## 2020-11-07 DIAGNOSIS — N184 Chronic kidney disease, stage 4 (severe): Secondary | ICD-10-CM | POA: Insufficient documentation

## 2020-11-07 DIAGNOSIS — R110 Nausea: Secondary | ICD-10-CM | POA: Diagnosis not present

## 2020-11-07 DIAGNOSIS — E1122 Type 2 diabetes mellitus with diabetic chronic kidney disease: Secondary | ICD-10-CM | POA: Diagnosis not present

## 2020-11-07 DIAGNOSIS — R1084 Generalized abdominal pain: Secondary | ICD-10-CM | POA: Diagnosis present

## 2020-11-07 DIAGNOSIS — Z951 Presence of aortocoronary bypass graft: Secondary | ICD-10-CM | POA: Diagnosis not present

## 2020-11-07 DIAGNOSIS — Z79899 Other long term (current) drug therapy: Secondary | ICD-10-CM | POA: Insufficient documentation

## 2020-11-07 DIAGNOSIS — I129 Hypertensive chronic kidney disease with stage 1 through stage 4 chronic kidney disease, or unspecified chronic kidney disease: Secondary | ICD-10-CM | POA: Diagnosis not present

## 2020-11-07 DIAGNOSIS — F0390 Unspecified dementia without behavioral disturbance: Secondary | ICD-10-CM | POA: Diagnosis not present

## 2020-11-07 DIAGNOSIS — Y900 Blood alcohol level of less than 20 mg/100 ml: Secondary | ICD-10-CM | POA: Insufficient documentation

## 2020-11-07 DIAGNOSIS — Z20822 Contact with and (suspected) exposure to covid-19: Secondary | ICD-10-CM | POA: Diagnosis not present

## 2020-11-07 DIAGNOSIS — Z7982 Long term (current) use of aspirin: Secondary | ICD-10-CM | POA: Insufficient documentation

## 2020-11-07 DIAGNOSIS — Z85528 Personal history of other malignant neoplasm of kidney: Secondary | ICD-10-CM | POA: Diagnosis not present

## 2020-11-07 DIAGNOSIS — E890 Postprocedural hypothyroidism: Secondary | ICD-10-CM | POA: Insufficient documentation

## 2020-11-07 LAB — COMPREHENSIVE METABOLIC PANEL
ALT: 15 U/L (ref 0–44)
AST: 15 U/L (ref 15–41)
Albumin: 3.3 g/dL — ABNORMAL LOW (ref 3.5–5.0)
Alkaline Phosphatase: 106 U/L (ref 38–126)
Anion gap: 6 (ref 5–15)
BUN: 33 mg/dL — ABNORMAL HIGH (ref 8–23)
CO2: 27 mmol/L (ref 22–32)
Calcium: 8.2 mg/dL — ABNORMAL LOW (ref 8.9–10.3)
Chloride: 103 mmol/L (ref 98–111)
Creatinine, Ser: 2.72 mg/dL — ABNORMAL HIGH (ref 0.44–1.00)
GFR, Estimated: 18 mL/min — ABNORMAL LOW (ref >60)
Glucose, Bld: 127 mg/dL — ABNORMAL HIGH (ref 70–99)
Potassium: 3.3 mmol/L — ABNORMAL LOW (ref 3.5–5.1)
Sodium: 136 mmol/L (ref 135–145)
Total Bilirubin: 0.4 mg/dL (ref 0.3–1.2)
Total Protein: 6 g/dL — ABNORMAL LOW (ref 6.5–8.1)

## 2020-11-07 LAB — CBC WITH DIFFERENTIAL/PLATELET
Abs Immature Granulocytes: 0.02 10*3/uL (ref 0.00–0.07)
Basophils Absolute: 0 10*3/uL (ref 0.0–0.1)
Basophils Relative: 0 %
Eosinophils Absolute: 0.1 10*3/uL (ref 0.0–0.5)
Eosinophils Relative: 3 %
HCT: 31.9 % — ABNORMAL LOW (ref 36.0–46.0)
Hemoglobin: 10.7 g/dL — ABNORMAL LOW (ref 12.0–15.0)
Immature Granulocytes: 0 %
Lymphocytes Relative: 27 %
Lymphs Abs: 1.5 10*3/uL (ref 0.7–4.0)
MCH: 30.7 pg (ref 26.0–34.0)
MCHC: 33.5 g/dL (ref 30.0–36.0)
MCV: 91.7 fL (ref 80.0–100.0)
Monocytes Absolute: 0.7 10*3/uL (ref 0.1–1.0)
Monocytes Relative: 12 %
Neutro Abs: 3.3 10*3/uL (ref 1.7–7.7)
Neutrophils Relative %: 58 %
Platelets: 197 10*3/uL (ref 150–400)
RBC: 3.48 MIL/uL — ABNORMAL LOW (ref 3.87–5.11)
RDW: 13.4 % (ref 11.5–15.5)
WBC: 5.6 10*3/uL (ref 4.0–10.5)
nRBC: 0 % (ref 0.0–0.2)

## 2020-11-07 LAB — ETHANOL: Alcohol, Ethyl (B): <10 mg/dL (ref <10)

## 2020-11-07 LAB — LIPASE, BLOOD: Lipase: 58 U/L — ABNORMAL HIGH (ref 11–51)

## 2020-11-07 NOTE — Discharge Instructions (Addendum)
As discussed, your evaluation today has been largely reassuring.  But, it is important that you monitor your condition carefully, and do not hesitate to return to the ED if you develop new, or concerning changes in your condition.  Otherwise, please follow-up with your physician for appropriate ongoing care.  A COVID test has been sent on your behalf.  These results should be available overnight.

## 2020-11-07 NOTE — ED Provider Notes (Signed)
[facility] EMERGENCY DEPARTMENT Provider Note   CSN: [id number] Arrival date & time: 11/07/20  2111     History Chief Complaint  Patient presents with   Abdominal Pain    Erin Jordan is a 75 y.o. female.  HPI Patient presents after an episode of abdominal pain, now resolved. Patient notes that she was well prior to the event, which occurred just prior to EMS transfer here. Per EMS the patient was unremarkable in route. Patient notes multiple medical issues, but states that she is been doing generally well.  It sounds as though after an interaction with her children she developed abdominal pain, that was diffuse, lasted about 30 minutes, and is resolved without intervention.  There was some nausea, but no vomiting, no diarrhea.  No recent other changes such as nausea, vomiting, diarrhea, urinary pattern differences.    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    Patient Active Problem List   Diagnosis Date Noted   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   CKD (chronic kidney disease), stage IV ([place]) 03/30/2020   Renal cell carcinoma ([place]) 08/02/2019   RLS (restless legs syndrome) 01/25/2019   Right renal mass 01/25/2019   Splenic mass 01/25/2019   Blood in stool 01/25/2019   Dementia without behavioral disturbance ([place]) 01/25/2019   Osteopenia 05/20/2018   Weight loss, unintentional 07/14/2017    CAD -S/P CABG in 2006 AND  PCI 06/26/17 06/27/2017   Free monoclonal light chain 04/14/2012   Hx of CABG- 2006    History of diabetes mellitus    Essential hypertension    Hypokalemia 06/02/2011   Dyslipidemia, goal LDL below 70 06/02/2011    Past Surgical History:  Procedure Laterality Date   ABDOMINAL HYSTERECTOMY     CORONARY ARTERY BYPASS GRAFT  2006   [place], [place]   CORONARY STENT INTERVENTION N/A 06/25/2017   Procedure: CORONARY STENT INTERVENTION;  Surgeon: Jettie Booze, MD;  Location: [facility];  Service: Cardiovascular;  Laterality: N/A;   IR RADIOLOGIST EVAL & MGMT  07/08/2019   LEFT HEART CATH AND CORS/GRAFTS ANGIOGRAPHY N/A 06/25/2017   Procedure: LEFT HEART CATH AND CORS/GRAFTS ANGIOGRAPHY;  Surgeon: Jettie Booze, MD;  Location: [place];  Service: Cardiovascular;  Laterality: N/A;   THYROIDECTOMY N/A 07/28/2020   Procedure: TOTAL THYROIDECTOMY;  Surgeon: Armandina Gemma, MD;  Location: [facility];  Service: General;  Laterality: N/A;  2 HOURS     OB History     Gravida      Para      Term      Preterm      AB      Living  6      SAB      IAB      Ectopic      Multiple  Live Births              Family History  Problem Relation Age of Onset   Heart attack Mother    Hypertension Mother    Seizures Son    Seizures Son    Seizures Daughter     Social History   Tobacco Use   Smoking status: Never   Smokeless tobacco: Never  Vaping Use   Vaping Use: Never used  Substance Use Topics   Alcohol use: No   Drug use: No    Home Medications Prior to Admission medications   Medication Sig Start Date End Date Taking? Authorizing Provider  amLODipine (NORVASC) 10 MG tablet Take 1 tablet (10 mg total) by mouth daily. 10/13/20   [name], FNP  aspirin EC 81 MG tablet Take 1 tablet (81 mg total) by mouth daily. Swallow whole. 10/13/20   [name], FNP  atorvastatin (LIPITOR) 80 MG tablet TAKE 1 TABLET  BY MOUTH ONCE DAILY. 08/24/20   [name], FNP  calcium carbonate (TUMS) 500 MG chewable tablet Chew 2 tablets (400 mg of elemental calcium total) by mouth 3 (three) times daily. 07/28/20   Armandina Gemma, MD  carvedilol (COREG) 6.25 MG tablet Take 1 tablet (6.25 mg total) by mouth 2 (two) times daily. For BP and Heart 10/13/20   Hendricks Limes F, FNP  donepezil (ARICEPT) 10 MG tablet TAKE (1) TABLET BY MOUTH AT BEDTIME. 10/13/20   Hendricks Limes F, FNP  levothyroxine (SYNTHROID) 100 MCG tablet Take 1 tablet (100 mcg total) by mouth daily before breakfast. 08/24/20   Nida, Marella Chimes, MD  traZODone (DESYREL) 50 MG tablet TAKE (1) TABLET BY MOUTH AT BEDTIME. 09/26/20   [name], FNP    Allergies    Patient has no known allergies.  Review of Systems   Review of Systems  Constitutional:        Per HPI, otherwise negative  HENT:         Per HPI, otherwise negative  Respiratory:         Per HPI, otherwise negative  Cardiovascular:        Per HPI, otherwise negative  Gastrointestinal:  Negative for vomiting.  Endocrine:       Negative aside from HPI  Genitourinary:        Neg aside from HPI   Musculoskeletal:        Per HPI, otherwise negative  Skin: Negative.   Neurological:  Negative for syncope.   Physical Exam Updated Vital Signs BP (!) 160/70   Pulse 63   Temp 99.3 F (37.4 C) (Oral)   Resp 14   Ht 5\' 6"  (1.676 m)   Wt 51 kg   SpO2 100%   BMI 18.15 kg/m   Physical Exam Vitals and nursing note reviewed.  Constitutional:      General: She is not in acute distress.    Appearance: She is well-developed.  HENT:     Head: Normocephalic and atraumatic.  Eyes:     Conjunctiva/sclera: Conjunctivae normal.  Cardiovascular:     Rate and Rhythm: Normal rate and regular rhythm.  Pulmonary:     Effort: Pulmonary effort is normal. No respiratory distress.     Breath sounds: Normal breath sounds. No stridor.  Abdominal:     General: There is no distension.      Tenderness: There is no abdominal tenderness. There is no guarding.  Skin:    General: Skin is warm  and dry.  Neurological:     Mental Status: She is alert and oriented to person, place, and time.     Cranial Nerves: No cranial nerve deficit.    ED Results / Procedures / Treatments   Labs (all labs ordered are listed, but only abnormal results are displayed) Labs Reviewed  COMPREHENSIVE METABOLIC PANEL - Abnormal; Notable for the following components:      Result Value   Potassium 3.3 (*)    Glucose, Bld 127 (*)    BUN 33 (*)    Creatinine, Ser 2.72 (*)    Calcium 8.2 (*)    Total Protein 6.0 (*)    Albumin 3.3 (*)    GFR, Estimated 18 (*)    All other components within normal limits  LIPASE, BLOOD - Abnormal; Notable for the following components:   Lipase 58 (*)    All other components within normal limits  CBC WITH DIFFERENTIAL/PLATELET - Abnormal; Notable for the following components:   RBC 3.48 (*)    Hemoglobin 10.7 (*)    HCT 31.9 (*)    All other components within normal limits  SARS CORONAVIRUS 2 (TAT 6-24 HRS)  ETHANOL     Procedures Procedures   Medications Ordered in ED Medications - No data to display  ED Course  I have reviewed the triage vital signs and the nursing notes.  Pertinent labs & imaging results that were available during my care of the patient were reviewed by me and considered in my medical decision making (see chart for details).   11:15 PM Patient awake, alert, sitting on the edge of the bed.  Vital signs unremarkable. Have reviewed her labs, which are slightly abnormal COVID, though consistent with multiple prior studies, without notable changes. With no ongoing pain, no hemodynamic instability, low suspicion for acute abdominal processes, no evidence for bacteremia, sepsis. Patient's pain may be secondary to early process versus other phenomena or COVID, test for this has been sent.  Patient comfortable with discharge with outpatient  follow-up as needed. Notably, the patient was found to have bedbugs, but is aware of these in her living situation.  Final Clinical Impression(s) / ED Diagnoses Final diagnoses:  Generalized abdominal pain     Carmin Muskrat, MD 11/07/20 2316

## 2020-11-07 NOTE — ED Triage Notes (Addendum)
Pt brought in by [facility] for c/o abdominal pain x 30 mins. EMS states pt stated that it felt like her abdomen was "boiling". Per EMS, pt stated abdominal pain was gone upon their arrival to ED and pt stated she "was just stressed" d/t family issues.

## 2020-11-08 LAB — SARS CORONAVIRUS 2 (TAT 6-24 HRS): SARS Coronavirus 2: NEGATIVE

## 2020-11-21 ENCOUNTER — Other Ambulatory Visit: Payer: Self-pay | Admitting: "Endocrinology

## 2020-11-24 ENCOUNTER — Ambulatory Visit: Payer: Medicare HMO | Admitting: Family Medicine

## 2020-11-27 ENCOUNTER — Ambulatory Visit: Payer: Medicare HMO | Admitting: Family Medicine

## 2020-11-29 ENCOUNTER — Ambulatory Visit (INDEPENDENT_AMBULATORY_CARE_PROVIDER_SITE_OTHER): Payer: Medicare HMO | Admitting: "Endocrinology

## 2020-11-29 ENCOUNTER — Other Ambulatory Visit: Payer: Self-pay

## 2020-11-29 ENCOUNTER — Encounter: Payer: Self-pay | Admitting: "Endocrinology

## 2020-11-29 VITALS — BP 148/76 | HR 64 | Ht 66.0 in | Wt 119.8 lb

## 2020-11-29 DIAGNOSIS — E890 Postprocedural hypothyroidism: Secondary | ICD-10-CM | POA: Diagnosis not present

## 2020-11-29 MED ORDER — LEVOTHYROXINE SODIUM 100 MCG PO TABS: ORAL_TABLET | ORAL | 1 refills | Status: DC

## 2020-11-29 NOTE — Progress Notes (Signed)
11/29/2020, 9:39 AM 11/29/2020   Subjective:    Patient ID: Erin Jordan, female    DOB: 1945/05/24, PCP [name], FNP   Past Medical History:  Diagnosis Date   Arthritis    Cancer of kidney [facility])    CKD (chronic kidney disease), stage IV ([place])    Coronary atherosclerosis of native coronary artery 2006   Multivessel status post CABG in [place], graft disease documented March 2019 with DES to SVG to diagonal   Essential hypertension    Free monoclonal light chain 04/14/2012   History of diabetes mellitus, type II    Hyperlipidemia    Hypothyroidism    NSTEMI (non-ST elevated myocardial infarction) ([place]) 06/24/2017   Pneumonia    hx of 2021    Renal hematoma    Right renal mass    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;  Service: Cardiovascular;  Laterality: N/A;   IR RADIOLOGIST EVAL & MGMT  07/08/2019   LEFT HEART CATH AND CORS/GRAFTS ANGIOGRAPHY N/A 06/25/2017   Procedure: LEFT HEART CATH AND CORS/GRAFTS ANGIOGRAPHY;  Surgeon: Jettie Booze, MD;  Location: [place];  Service: Cardiovascular;  Laterality: N/A;   THYROIDECTOMY N/A 07/28/2020   Procedure: TOTAL THYROIDECTOMY;  Surgeon: Armandina Gemma, MD;  [facility];  Service: General;  Laterality: N/A;  2 HOURS   Social History   Socioeconomic History   Marital status: Widowed    Spouse name: Not on file   Number of children: 6   Years of education: Not on file   Highest education level: Not on file  Occupational History   Occupation: retired  Tobacco Use   Smoking status: Never   Smokeless tobacco: Never  Vaping Use   Vaping Use: Never used   Substance and Sexual Activity   Alcohol use: No   Drug use: No   Sexual activity: Never  Other Topics Concern   Not on file  Social History Narrative   Lives in [place] with her daughter and grandchildren.   She has dementia   Social Determinants of Radio broadcast assistant Strain: Medium Risk   Difficulty of Paying Living Expenses: Somewhat hard  Food Insecurity: No Food Insecurity   Worried About Charity fundraiser in the Last Year: Never true   Ran Out of Food in the Last Year: Never true  Transportation Needs: No Transportation Needs   Lack of Transportation (Medical): No   Lack of Transportation (Non-Medical): No  Physical Activity: Insufficiently Active   Days of Exercise per Week: 7 days   Minutes of Exercise per Session: 20 min  Stress: No Stress Concern Present   Feeling of Stress : Only a little  Social Connections: Moderately Isolated   Frequency of Communication with Friends and Family: More than three times a week   Frequency of Social Gatherings with Friends and Family: More than three times a week  Attends Religious Services: 1 to 4 times per year   Active Member of Clubs or Organizations: No   Attends Archivist Meetings: Never   Marital Status: Widowed   Outpatient Encounter Medications as of 11/29/2020  Medication Sig   amLODipine (NORVASC) 10 MG tablet Take 1 tablet (10 mg total) by mouth daily.   aspirin EC 81 MG tablet Take 1 tablet (81 mg total) by mouth daily. Swallow whole.   atorvastatin (LIPITOR) 80 MG tablet TAKE 1 TABLET BY MOUTH ONCE DAILY.   calcium carbonate (TUMS) 500 MG chewable tablet Chew 2 tablets (400 mg of elemental calcium total) by mouth 3 (three) times daily.   carvedilol (COREG) 6.25 MG tablet Take 1 tablet (6.25 mg total) by mouth 2 (two) times daily. For BP and Heart   donepezil (ARICEPT) 10 MG tablet TAKE (1) TABLET BY MOUTH AT BEDTIME.   levothyroxine (SYNTHROID) 100 MCG tablet TAKE 1 TABLET BY MOUTH ONCE BEFORE  BREAKFAST.   traZODone (DESYREL) 50 MG tablet TAKE (1) TABLET BY MOUTH AT BEDTIME.   [DISCONTINUED] levothyroxine (SYNTHROID) 100 MCG tablet TAKE 1 TABLET BY MOUTH ONCE BEFORE BREAKFAST.   No facility-administered encounter medications on file as of 11/29/2020.   ALLERGIES: No Known Allergies  VACCINATION STATUS: Immunization History  Administered Date(s) Administered   Fluad Quad(high Dose 65+) 01/25/2019   Influenza Split 06/03/2011   Influenza, High Dose Seasonal PF 06/27/2017   Pneumococcal Conjugate-13 03/26/2013   Pneumococcal Polysaccharide-23 11/03/2007, 02/14/2015, 06/27/2017    HPI Erin Jordan is 75 y.o. female who presents today with a medical history as above. she was previously seen in consultation for large compressive multinodular goiter.  See previous visit notes.  She was referred by  [name], FNP.  -She has known about having goiter for several years.  She did not seek medical attention for it until March of 05/2017 when she was brought by EMS to ER with chest pain.  She was subsequently found to have coronary artery disease which required coronary artery bypass graft.  In the process, she was diagnosed with multinodular goiter confirmed by ultrasound on July 29, 2017. -She was also found to have elevated free T4 associated with suppressed TSH. -Due to her presentation consistent with compressive multinodular goiter, she was directly sent for thyroidectomy in July 2019.  She delayed her surgery until very recently, July 28, 2020.  Her surgical samples are negative for malignancy. She was discharged on levothyroxine 88 mcg p.o. daily before breakfast.  She reports compliance to her medication.  During her last visit here her levothyroxine was adjusted to 100 mcg.  Her previsit thyroid function tests are significantly better.  Her previous symptoms of neck compression have resolved.  She has no new complaints today.   She denies any history of neck  radiation.   Review of Systems  Constitutional: With BMI of 22.4.     Objective:    BP (!) 148/76   Pulse 64   Ht 5\' 6"  (1.676 m)   Wt 119 lb 12.8 oz (54.3 kg)   BMI 19.34 kg/m   Wt Readings from Last 3 Encounters:  11/29/20 119 lb 12.8 oz (54.3 kg)  11/07/20 112 lb 7 oz (51 kg)  10/29/20 112 lb 6.4 oz (51 kg)    Physical Exam  Constitutional:  + appropriate weight for height, not in acute distress, normal state of mind Eyes: PERRLA, EOMI, no exophthalmos ENT: Well-healed post thyroidectomy surgical scar on anterior lower neck.  moist mucous membranes, + huge nodular thyromegaly, no cervical lymphadenopathy C  CMP ( most recent) CMP     Component Value Date/Time   NA 136 11/07/2020 2211   NA 142 10/13/2020 1004   K 3.3 (L) 11/07/2020 2211   CL 103 11/07/2020 2211   CO2 27 11/07/2020 2211   GLUCOSE 127 (H) 11/07/2020 2211   BUN 33 (H) 11/07/2020 2211   BUN 32 (H) 10/13/2020 1004   CREATININE 2.72 (H) 11/07/2020 2211   CALCIUM 8.2 (L) 11/07/2020 2211   PROT 6.0 (L) 11/07/2020 2211   PROT 7.2 10/13/2020 1004   ALBUMIN 3.3 (L) 11/07/2020 2211   ALBUMIN 4.6 10/13/2020 1004   AST 15 11/07/2020 2211   ALT 15 11/07/2020 2211   ALKPHOS 106 11/07/2020 2211   BILITOT 0.4 11/07/2020 2211   BILITOT 0.4 10/13/2020 1004   GFRNONAA 18 (L) 11/07/2020 2211   GFRAA 18 (L) 04/19/2020 1455     Diabetic Labs (most recent): Lab Results  Component Value Date   HGBA1C 5.4 07/19/2020   HGBA1C 6.1 01/21/2020   HGBA1C 6.2 (H) 08/03/2019     Lipid Panel ( most recent) Lipid Panel     Component Value Date/Time   CHOL 264 (H) 10/13/2020 1004   TRIG 76 10/13/2020 1004   HDL 98 10/13/2020 1004   CHOLHDL 2.7 10/13/2020 1004   CHOLHDL 4.4 06/02/2011 0431   VLDL 18 06/02/2011 0431   LDLCALC 154 (H) 10/13/2020 1004    Total thyroidectomy July 28, 2020.  Final microscopic diagnosis: Thyroid total thyroidectomy: Benign nodular hyperplasia.  Results for TYESE, FINKEN  (MRN [id number]) as of 11/29/2020 09:56  Ref. Range 09/02/2020 04:18 10/13/2020 10:04  TSH Latest Ref Range: 0.450 - 4.500 uIU/mL 25.022 (H) 5.500 (H)  T4,Free(Direct) Latest Ref Range: 0.82 - 1.77 ng/dL 1.00 1.68     Assessment & Plan:   1. Toxic multinodular goiter-status post total thyroidectomy.  Neck compression symptoms resolved.  Followed by Dr. Armandina Gemma. Surgical pathology is benign.  No further intervention needed.  2.  Postsurgical hypothyroidism Her previsit thyroid function tests are significantly better.  She will not tolerate any higher dose of levothyroxine.  She is advised to continue levothyroxine 100 mcg p.o. daily before breakfast.    - We discussed about the correct intake of her thyroid hormone, on empty stomach at fasting, with water, separated by at least 30 minutes from breakfast and other medications,  and separated by more than 4 hours from calcium, iron, multivitamins, acid reflux medications (PPIs). -Patient is made aware of the fact that thyroid hormone replacement is needed for life, dose to be adjusted by periodic monitoring of thyroid function tests.   - I advised her  to maintain close follow up with [name], FNP for primary care needs.  I spent 21 minutes in the care of the patient today including review of labs from Thyroid Function, CMP, and other relevant labs ; imaging/biopsy records (current and previous including abstractions from other facilities); face-to-face time discussing  her lab results and symptoms, medications doses, her options of short and long term treatment based on the latest standards of care / guidelines;   and documenting the encounter.  Erin Jordan  participated in the discussions, expressed understanding, and voiced agreement with the above plans.  All questions were answered to her satisfaction. she is encouraged to contact clinic should she have any questions or concerns prior to her return visit.   Follow up  plan:  Return in about 6 months (around 06/01/2021) for F/U with Pre-visit Labs.   Glade Lloyd, MD [facility] [address] [place], [place] [address] Phone: [phone]  Fax: [phone]     11/29/2020, 9:39 AM  This note was partially dictated with voice recognition software. Similar sounding words can be transcribed inadequately or may not  be corrected upon review.

## 2020-12-07 ENCOUNTER — Ambulatory Visit: Payer: Medicare HMO | Admitting: Family Medicine

## 2020-12-14 ENCOUNTER — Emergency Department (HOSPITAL_COMMUNITY)
Admission: EM | Admit: 2020-12-14 | Discharge: 2020-12-14 | Disposition: A | Discharge status: Home / Self Care | Payer: Medicare HMO | Attending: Emergency Medicine | Admitting: Emergency Medicine

## 2020-12-14 ENCOUNTER — Other Ambulatory Visit: Payer: Self-pay

## 2020-12-14 ENCOUNTER — Encounter (HOSPITAL_COMMUNITY): Payer: Self-pay

## 2020-12-14 DIAGNOSIS — F0390 Unspecified dementia without behavioral disturbance: Secondary | ICD-10-CM | POA: Diagnosis not present

## 2020-12-14 DIAGNOSIS — Z79899 Other long term (current) drug therapy: Secondary | ICD-10-CM | POA: Insufficient documentation

## 2020-12-14 DIAGNOSIS — Z951 Presence of aortocoronary bypass graft: Secondary | ICD-10-CM | POA: Diagnosis not present

## 2020-12-14 DIAGNOSIS — Z85528 Personal history of other malignant neoplasm of kidney: Secondary | ICD-10-CM | POA: Diagnosis not present

## 2020-12-14 DIAGNOSIS — E039 Hypothyroidism, unspecified: Secondary | ICD-10-CM | POA: Diagnosis not present

## 2020-12-14 DIAGNOSIS — I129 Hypertensive chronic kidney disease with stage 1 through stage 4 chronic kidney disease, or unspecified chronic kidney disease: Secondary | ICD-10-CM | POA: Insufficient documentation

## 2020-12-14 DIAGNOSIS — I2510 Atherosclerotic heart disease of native coronary artery without angina pectoris: Secondary | ICD-10-CM | POA: Insufficient documentation

## 2020-12-14 DIAGNOSIS — M79605 Pain in left leg: Secondary | ICD-10-CM | POA: Diagnosis not present

## 2020-12-14 DIAGNOSIS — N184 Chronic kidney disease, stage 4 (severe): Secondary | ICD-10-CM | POA: Insufficient documentation

## 2020-12-14 DIAGNOSIS — E1122 Type 2 diabetes mellitus with diabetic chronic kidney disease: Secondary | ICD-10-CM | POA: Insufficient documentation

## 2020-12-14 DIAGNOSIS — M79604 Pain in right leg: Secondary | ICD-10-CM | POA: Insufficient documentation

## 2020-12-14 DIAGNOSIS — Z7982 Long term (current) use of aspirin: Secondary | ICD-10-CM | POA: Diagnosis not present

## 2020-12-14 LAB — CBC WITH DIFFERENTIAL/PLATELET
Abs Immature Granulocytes: 0.02 10*3/uL (ref 0.00–0.07)
Basophils Absolute: 0 10*3/uL (ref 0.0–0.1)
Basophils Relative: 1 %
Eosinophils Absolute: 0.2 10*3/uL (ref 0.0–0.5)
Eosinophils Relative: 3 %
HCT: 33.3 % — ABNORMAL LOW (ref 36.0–46.0)
Hemoglobin: 11.2 g/dL — ABNORMAL LOW (ref 12.0–15.0)
Immature Granulocytes: 0 %
Lymphocytes Relative: 23 %
Lymphs Abs: 1.4 10*3/uL (ref 0.7–4.0)
MCH: 30.9 pg (ref 26.0–34.0)
MCHC: 33.6 g/dL (ref 30.0–36.0)
MCV: 91.7 fL (ref 80.0–100.0)
Monocytes Absolute: 0.7 10*3/uL (ref 0.1–1.0)
Monocytes Relative: 11 %
Neutro Abs: 3.7 10*3/uL (ref 1.7–7.7)
Neutrophils Relative %: 62 %
Platelets: 188 10*3/uL (ref 150–400)
RBC: 3.63 MIL/uL — ABNORMAL LOW (ref 3.87–5.11)
RDW: 13.6 % (ref 11.5–15.5)
WBC: 6 10*3/uL (ref 4.0–10.5)
nRBC: 0 % (ref 0.0–0.2)

## 2020-12-14 LAB — BASIC METABOLIC PANEL
Anion gap: 8 (ref 5–15)
BUN: 33 mg/dL — ABNORMAL HIGH (ref 8–23)
CO2: 24 mmol/L (ref 22–32)
Calcium: 8.9 mg/dL (ref 8.9–10.3)
Chloride: 109 mmol/L (ref 98–111)
Creatinine, Ser: 2.26 mg/dL — ABNORMAL HIGH (ref 0.44–1.00)
GFR, Estimated: 22 mL/min — ABNORMAL LOW (ref >60)
Glucose, Bld: 136 mg/dL — ABNORMAL HIGH (ref 70–99)
Potassium: 3.6 mmol/L (ref 3.5–5.1)
Sodium: 141 mmol/L (ref 135–145)

## 2020-12-14 LAB — CK: Total CK: 350 U/L — ABNORMAL HIGH (ref 38–234)

## 2020-12-14 MED ORDER — HYDROCODONE-ACETAMINOPHEN 5-325 MG PO TABS
2.0000 | ORAL_TABLET | Freq: Once | ORAL | Status: AC
  Administered 2020-12-14: 2 via ORAL
  Filled 2020-12-14: qty 2

## 2020-12-14 MED ORDER — TRAMADOL HCL 50 MG PO TABS: 50.0000 mg | ORAL_TABLET | Freq: Four times a day (QID) | ORAL | 0 refills | Status: DC | PRN

## 2020-12-14 NOTE — Discharge Instructions (Addendum)
Tramadol as prescribed as needed for pain.  Follow up with primary doctor if symptoms do not improve in the next few days.

## 2020-12-14 NOTE — ED Triage Notes (Signed)
Complains of bilateral leg pain that started tonight. Pt ambulated to bed.

## 2020-12-14 NOTE — ED Provider Notes (Signed)
[facility] EMERGENCY DEPARTMENT Provider Note   CSN: [id number] Arrival date & time: 12/14/20  0425     History Chief Complaint  Patient presents with   Leg Pain    Erin Jordan is a 75 y.o. female.  Patient is a 75 year old female with past medical history of type 2 diabetes, hypertension, chronic renal insufficiency, restless legs.  Patient presenting today for evaluation of leg pain.  This woke her from sleep just prior to arrival.  She describes an aching in her legs from the knees down.  She denies any injury or trauma.  She denies any recent illness.  She denies any chest pain or difficulty breathing.  The history is provided by the patient.  Leg Pain Time since incident:  2 hours Injury: no   Pain details:    Quality:  Aching   Radiates to:  Does not radiate   Severity:  Moderate   Onset quality:  Sudden   Timing:  Constant   Progression:  Worsening Chronicity:  New Relieved by:  Nothing Worsened by:  Nothing Ineffective treatments:  None tried     Past Medical History:  Diagnosis Date   Arthritis    Cancer of kidney ([place])    CKD (chronic kidney disease), stage IV (HCC)    Coronary atherosclerosis of native coronary artery 2006   Multivessel status post CABG in [place], graft disease documented March 2019 with DES to SVG to diagonal   Essential hypertension    Free monoclonal light chain 04/14/2012   History of diabetes mellitus, type II    Hyperlipidemia    Hypothyroidism    NSTEMI (non-ST elevated myocardial infarction) ([place]) 06/24/2017   Pneumonia    hx of 2021    Renal hematoma    Right renal mass    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    Patient Active Problem List   Diagnosis Date Noted   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   CKD (chronic kidney disease), stage IV ([place]) 03/30/2020   Renal cell carcinoma ([place]) 08/02/2019   RLS (restless legs syndrome) 01/25/2019   Right renal mass 01/25/2019   Splenic mass 01/25/2019   Blood  in stool 01/25/2019   Dementia without behavioral disturbance ([place]) 01/25/2019   Osteopenia 05/20/2018   Weight loss, unintentional 07/14/2017   CAD -S/P CABG in 2006 AND  PCI 06/26/17 06/27/2017   Free monoclonal light chain 04/14/2012   Hx of CABG- 2006    History of diabetes mellitus    Essential hypertension    Hypokalemia 06/02/2011   Dyslipidemia, goal LDL below 70 06/02/2011    Past Surgical History:  Procedure Laterality Date   ABDOMINAL HYSTERECTOMY     CORONARY ARTERY BYPASS GRAFT  2006   [place], [place]   CORONARY STENT INTERVENTION N/A 06/25/2017   Procedure: CORONARY STENT INTERVENTION;  Surgeon: Jettie Booze, MD;  Location: [place] [facility];  Service: Cardiovascular;  Laterality: N/A;   IR RADIOLOGIST EVAL & MGMT  07/08/2019   LEFT HEART CATH AND CORS/GRAFTS ANGIOGRAPHY N/A 06/25/2017   Procedure: LEFT HEART CATH AND CORS/GRAFTS ANGIOGRAPHY;  Surgeon: Jettie Booze, MD;  Location: [place] [facility];  Service: Cardiovascular;  Laterality: N/A;   THYROIDECTOMY N/A 07/28/2020   Procedure: TOTAL THYROIDECTOMY;  Surgeon: Armandina Gemma, MD;  Location: [facility];  Service: General;  Laterality: N/A;  2 HOURS     OB History     Gravida      Para      Term  Preterm      AB      Living  6      SAB      IAB      Ectopic      Multiple      Live Births              Family History  Problem Relation Age of Onset   Heart attack Mother    Hypertension Mother    Seizures Son    Seizures Son    Seizures Daughter     Social History   Tobacco Use   Smoking status: Never   Smokeless tobacco: Never  Vaping Use   Vaping Use: Never used  Substance Use Topics   Alcohol use: No   Drug use: No    Home Medications Prior to Admission medications   Medication Sig Start Date End Date Taking? Authorizing Provider  amLODipine (NORVASC) 10 MG tablet Take 1 tablet (10 mg total) by mouth daily. 10/13/20   [name], FNP  aspirin EC 81 MG  tablet Take 1 tablet (81 mg total) by mouth daily. Swallow whole. 10/13/20   [name], FNP  atorvastatin (LIPITOR) 80 MG tablet TAKE 1 TABLET BY MOUTH ONCE DAILY. 08/24/20   [name], FNP  calcium carbonate (TUMS) 500 MG chewable tablet Chew 2 tablets (400 mg of elemental calcium total) by mouth 3 (three) times daily. 07/28/20   Armandina Gemma, MD  carvedilol (COREG) 6.25 MG tablet Take 1 tablet (6.25 mg total) by mouth 2 (two) times daily. For BP and Heart 10/13/20   Hendricks Limes F, FNP  donepezil (ARICEPT) 10 MG tablet TAKE (1) TABLET BY MOUTH AT BEDTIME. 10/13/20   Hendricks Limes F, FNP  levothyroxine (SYNTHROID) 100 MCG tablet TAKE 1 TABLET BY MOUTH ONCE BEFORE BREAKFAST. 11/29/20   Cassandria Anger, MD  traZODone (DESYREL) 50 MG tablet TAKE (1) TABLET BY MOUTH AT BEDTIME. 09/26/20   [name], FNP    Allergies    Patient has no known allergies.  Review of Systems   Review of Systems  All other systems reviewed and are negative.  Physical Exam Updated Vital Signs Ht 5\' 6"  (1.676 m)   Wt 56 kg   BMI 19.93 kg/m   Physical Exam Vitals and nursing note reviewed.  Constitutional:      General: She is not in acute distress.    Appearance: She is well-developed. She is not diaphoretic.     Comments: Patient is awake and alert.  She is in no acute distress.  She is chronically ill-appearing.  HENT:     Head: Normocephalic and atraumatic.  Cardiovascular:     Rate and Rhythm: Normal rate and regular rhythm.     Heart sounds: No murmur heard.   No friction rub. No gallop.  Pulmonary:     Effort: Pulmonary effort is normal. No respiratory distress.     Breath sounds: Normal breath sounds. No wheezing.  Abdominal:     General: Bowel sounds are normal. There is no distension.     Palpations: Abdomen is soft.     Tenderness: There is no abdominal tenderness.  Musculoskeletal:        General: Normal range of motion.     Cervical back: Normal range of motion and neck  supple.     Comments: Lower extremities are somewhat cachectic, but appear grossly normal.  The legs are symmetrical with no calf tenderness and Bevelyn Buckles' sign is  absent.  DP pulses are palpable bilaterally and she has good range of motion of the ankle and knees.  Skin:    General: Skin is warm and dry.  Neurological:     General: No focal deficit present.     Mental Status: She is alert and oriented to person, place, and time.    ED Results / Procedures / Treatments   Labs (all labs ordered are listed, but only abnormal results are displayed) Labs Reviewed - No data to display  EKG None  Radiology No results found.  Procedures Procedures   Medications Ordered in ED Medications  HYDROcodone-acetaminophen (NORCO/VICODIN) 5-325 MG per tablet 2 tablet (has no administration in time range)    ED Course  I have reviewed the triage vital signs and the nursing notes.  Pertinent labs & imaging results that were available during my care of the patient were reviewed by me and considered in my medical decision making (see chart for details).    MDM Rules/Calculators/A&P  Patient presenting here with complaints of bilateral leg pain that began in the absence of any injury or trauma.  Patient's physical examination is unremarkable.  The extremities appear well-perfused and DP pulses are easily palpable.  Laboratory studies reveal unremarkable electrolytes and CK levels not reflective of rhabdo.  Patient given hydrocodone and seems to be feeling better.  At this point, I feel as though discharge is appropriate.  I highly doubt bilateral DVT or other acute issue.  Patient seems appropriate for discharge with return as needed.  Final Clinical Impression(s) / ED Diagnoses Final diagnoses:  None    Rx / DC Orders ED Discharge Orders     None        Veryl Speak, MD 12/14/20 [phone]

## 2020-12-15 ENCOUNTER — Ambulatory Visit (HOSPITAL_COMMUNITY): Payer: Medicare HMO

## 2020-12-18 ENCOUNTER — Encounter: Payer: Self-pay | Admitting: Family Medicine

## 2020-12-22 ENCOUNTER — Ambulatory Visit: Payer: Medicare HMO | Admitting: Urology

## 2020-12-23 ENCOUNTER — Other Ambulatory Visit: Payer: Self-pay | Admitting: Family Medicine

## 2021-01-05 ENCOUNTER — Ambulatory Visit (HOSPITAL_COMMUNITY): Payer: Medicare HMO

## 2021-01-05 ENCOUNTER — Encounter (HOSPITAL_COMMUNITY): Payer: Self-pay

## 2021-01-08 ENCOUNTER — Other Ambulatory Visit: Payer: Self-pay

## 2021-01-08 ENCOUNTER — Emergency Department (HOSPITAL_COMMUNITY): Payer: Medicare HMO

## 2021-01-08 ENCOUNTER — Emergency Department (HOSPITAL_COMMUNITY)
Admission: EM | Admit: 2021-01-08 | Discharge: 2021-01-08 | Disposition: A | Discharge status: Home / Self Care | Payer: Medicare HMO | Attending: Emergency Medicine | Admitting: Emergency Medicine

## 2021-01-08 ENCOUNTER — Encounter (HOSPITAL_COMMUNITY): Payer: Self-pay

## 2021-01-08 DIAGNOSIS — E039 Hypothyroidism, unspecified: Secondary | ICD-10-CM | POA: Insufficient documentation

## 2021-01-08 DIAGNOSIS — E1122 Type 2 diabetes mellitus with diabetic chronic kidney disease: Secondary | ICD-10-CM | POA: Diagnosis not present

## 2021-01-08 DIAGNOSIS — Z79899 Other long term (current) drug therapy: Secondary | ICD-10-CM | POA: Insufficient documentation

## 2021-01-08 DIAGNOSIS — N184 Chronic kidney disease, stage 4 (severe): Secondary | ICD-10-CM | POA: Insufficient documentation

## 2021-01-08 DIAGNOSIS — Z7982 Long term (current) use of aspirin: Secondary | ICD-10-CM | POA: Diagnosis not present

## 2021-01-08 DIAGNOSIS — M79606 Pain in leg, unspecified: Secondary | ICD-10-CM | POA: Diagnosis not present

## 2021-01-08 DIAGNOSIS — Z20822 Contact with and (suspected) exposure to covid-19: Secondary | ICD-10-CM | POA: Insufficient documentation

## 2021-01-08 DIAGNOSIS — I2510 Atherosclerotic heart disease of native coronary artery without angina pectoris: Secondary | ICD-10-CM | POA: Insufficient documentation

## 2021-01-08 DIAGNOSIS — Z951 Presence of aortocoronary bypass graft: Secondary | ICD-10-CM | POA: Insufficient documentation

## 2021-01-08 DIAGNOSIS — I129 Hypertensive chronic kidney disease with stage 1 through stage 4 chronic kidney disease, or unspecified chronic kidney disease: Secondary | ICD-10-CM | POA: Insufficient documentation

## 2021-01-08 DIAGNOSIS — R531 Weakness: Secondary | ICD-10-CM | POA: Diagnosis present

## 2021-01-08 DIAGNOSIS — F0390 Unspecified dementia without behavioral disturbance: Secondary | ICD-10-CM | POA: Diagnosis not present

## 2021-01-08 DIAGNOSIS — Z85528 Personal history of other malignant neoplasm of kidney: Secondary | ICD-10-CM | POA: Insufficient documentation

## 2021-01-08 LAB — CBC
HCT: 38.5 % (ref 36.0–46.0)
Hemoglobin: 12.8 g/dL (ref 12.0–15.0)
MCH: 29.8 pg (ref 26.0–34.0)
MCHC: 33.2 g/dL (ref 30.0–36.0)
MCV: 89.5 fL (ref 80.0–100.0)
Platelets: 223 10*3/uL (ref 150–400)
RBC: 4.3 MIL/uL (ref 3.87–5.11)
RDW: 13.4 % (ref 11.5–15.5)
WBC: 7.6 10*3/uL (ref 4.0–10.5)
nRBC: 0 % (ref 0.0–0.2)

## 2021-01-08 LAB — DIFFERENTIAL
Abs Immature Granulocytes: 0.03 10*3/uL (ref 0.00–0.07)
Basophils Absolute: 0.1 10*3/uL (ref 0.0–0.1)
Basophils Relative: 1 %
Eosinophils Absolute: 0.1 10*3/uL (ref 0.0–0.5)
Eosinophils Relative: 1 %
Immature Granulocytes: 0 %
Lymphocytes Relative: 11 %
Lymphs Abs: 0.8 10*3/uL (ref 0.7–4.0)
Monocytes Absolute: 0.4 10*3/uL (ref 0.1–1.0)
Monocytes Relative: 6 %
Neutro Abs: 6.2 10*3/uL (ref 1.7–7.7)
Neutrophils Relative %: 81 %

## 2021-01-08 LAB — PROTIME-INR
INR: 1.1 (ref 0.8–1.2)
Prothrombin Time: 14 seconds (ref 11.4–15.2)

## 2021-01-08 LAB — RESP PANEL BY RT-PCR (FLU A&B, COVID) ARPGX2
Influenza A by PCR: NEGATIVE
Influenza B by PCR: NEGATIVE
SARS Coronavirus 2 by RT PCR: NEGATIVE

## 2021-01-08 LAB — BLOOD GAS, VENOUS
Acid-base deficit: 1.8 mmol/L (ref 0.0–2.0)
Bicarbonate: 21.7 mmol/L (ref 20.0–28.0)
FIO2: 21
O2 Saturation: 77.4 %
Patient temperature: 36.4
pCO2, Ven: 48.3 mmHg (ref 44.0–60.0)
pH, Ven: 7.309 (ref 7.250–7.430)
pO2, Ven: 45.9 mmHg — ABNORMAL HIGH (ref 32.0–45.0)

## 2021-01-08 LAB — COMPREHENSIVE METABOLIC PANEL
ALT: 20 U/L (ref 0–44)
AST: 20 U/L (ref 15–41)
Albumin: 3.9 g/dL (ref 3.5–5.0)
Alkaline Phosphatase: 138 U/L — ABNORMAL HIGH (ref 38–126)
Anion gap: 9 (ref 5–15)
BUN: 50 mg/dL — ABNORMAL HIGH (ref 8–23)
CO2: 25 mmol/L (ref 22–32)
Calcium: 9.4 mg/dL (ref 8.9–10.3)
Chloride: 108 mmol/L (ref 98–111)
Creatinine, Ser: 2.7 mg/dL — ABNORMAL HIGH (ref 0.44–1.00)
GFR, Estimated: 18 mL/min — ABNORMAL LOW (ref >60)
Glucose, Bld: 153 mg/dL — ABNORMAL HIGH (ref 70–99)
Potassium: 3.9 mmol/L (ref 3.5–5.1)
Sodium: 142 mmol/L (ref 135–145)
Total Bilirubin: 0.9 mg/dL (ref 0.3–1.2)
Total Protein: 7.3 g/dL (ref 6.5–8.1)

## 2021-01-08 LAB — AMMONIA: Ammonia: <10 umol/L (ref 9–35)

## 2021-01-08 LAB — URINALYSIS, ROUTINE W REFLEX MICROSCOPIC
Bilirubin Urine: NEGATIVE
Glucose, UA: 50 mg/dL — AB
Ketones, ur: NEGATIVE mg/dL
Leukocytes,Ua: NEGATIVE
Nitrite: NEGATIVE
Protein, ur: >=300 mg/dL — AB
Specific Gravity, Urine: 1.012 (ref 1.005–1.030)
pH: 5 (ref 5.0–8.0)

## 2021-01-08 LAB — RAPID URINE DRUG SCREEN, HOSP PERFORMED
Amphetamines: NOT DETECTED
Barbiturates: NOT DETECTED
Benzodiazepines: NOT DETECTED
Cocaine: NOT DETECTED
Opiates: NOT DETECTED
Tetrahydrocannabinol: NOT DETECTED

## 2021-01-08 LAB — APTT: aPTT: 26 seconds (ref 24–36)

## 2021-01-08 LAB — ETHANOL: Alcohol, Ethyl (B): <10 mg/dL (ref <10)

## 2021-01-08 MED ORDER — SODIUM CHLORIDE 0.9 % IV SOLN: 100.0000 mL/h | INTRAVENOUS | Status: DC

## 2021-01-08 MED ORDER — SODIUM CHLORIDE 0.9 % IV BOLUS
500.0000 mL | Freq: Once | INTRAVENOUS | Status: AC
  Administered 2021-01-08: 500 mL via INTRAVENOUS

## 2021-01-08 NOTE — ED Notes (Signed)
Purwick was placed @12 :50pm by this NT

## 2021-01-08 NOTE — ED Notes (Signed)
Pt family member called for d/c.  States will be here in 30 min

## 2021-01-08 NOTE — ED Provider Notes (Signed)
[facility] EMERGENCY DEPARTMENT Provider Note   CSN: [id number] Arrival date & time: 01/08/21  1115     History Chief Complaint  Patient presents with   Weakness    Erin Jordan is a 75 y.o. female.   Weakness  Patient presented to the ED for evaluation of weakness.  According to the EMS report family was concerned about decreased mobility and generalized weakness.  The patient herself denies any complaints.  She cannot really tell me why she is here.  She denies any chest pain or abdominal pain.  No fevers or chills.  Past Medical History:  Diagnosis Date   Arthritis    Cancer of kidney ([place])    CKD (chronic kidney disease), stage IV ([place])    Coronary atherosclerosis of native coronary artery 2006   Multivessel status post CABG in [place], graft disease documented March 2019 with DES to SVG to diagonal   Essential hypertension    Free monoclonal light chain 04/14/2012   History of diabetes mellitus, type II    Hyperlipidemia    Hypothyroidism    NSTEMI (non-ST elevated myocardial infarction) ([place]) 06/24/2017   Pneumonia    hx of 2021    Renal hematoma    Right renal mass    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    Patient Active Problem List   Diagnosis Date Noted   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) 03/30/2020   Renal cell carcinoma ([place]) 08/02/2019   RLS (restless legs syndrome) 01/25/2019   Right renal mass 01/25/2019   Splenic mass 01/25/2019   Blood in stool 01/25/2019   Dementia without behavioral disturbance ([place]) 01/25/2019   Osteopenia 05/20/2018   Weight loss, unintentional 07/14/2017   CAD -S/P CABG in 2006 AND  PCI 06/26/17 06/27/2017   Free monoclonal light chain 04/14/2012   Hx of CABG- 2006    History of diabetes mellitus    Essential hypertension    Hypokalemia 06/02/2011   Dyslipidemia, goal LDL below 70 06/02/2011    Past Surgical History:  Procedure Laterality Date    ABDOMINAL HYSTERECTOMY     CORONARY ARTERY BYPASS GRAFT  2006   [place], [place]   CORONARY STENT INTERVENTION N/A 06/25/2017   Procedure: CORONARY STENT INTERVENTION;  Surgeon: Jettie Booze, MD;  Location: [place] [facility];  Service: Cardiovascular;  Laterality: N/A;   IR RADIOLOGIST EVAL & MGMT  07/08/2019   LEFT HEART CATH AND CORS/GRAFTS ANGIOGRAPHY N/A 06/25/2017   Procedure: LEFT HEART CATH AND CORS/GRAFTS ANGIOGRAPHY;  Surgeon: Jettie Booze, MD;  Location: [place] [facility];  Service: Cardiovascular;  Laterality: N/A;   THYROIDECTOMY N/A 07/28/2020   Procedure: TOTAL THYROIDECTOMY;  Surgeon: Armandina Gemma, MD;  [facility];  Service: General;  Laterality: N/A;  2 HOURS     OB History     Gravida      Para      Term      Preterm      AB      Living  6      SAB      IAB      Ectopic      Multiple      Live Births              Family History  Problem Relation Age of Onset   Heart attack Mother    Hypertension Mother    Seizures Son    Seizures Son  Seizures Daughter     Social History   Tobacco Use   Smoking status: Never   Smokeless tobacco: Never  Vaping Use   Vaping Use: Never used  Substance Use Topics   Alcohol use: No   Drug use: No    Home Medications Prior to Admission medications   Medication Sig Start Date End Date Taking? Authorizing Provider  amLODipine (NORVASC) 10 MG tablet Take 1 tablet (10 mg total) by mouth daily. 10/13/20  Yes Hendricks Limes F, FNP  aspirin EC 81 MG tablet Take 1 tablet (81 mg total) by mouth daily. Swallow whole. 10/13/20  Yes Hendricks Limes F, FNP  atorvastatin (LIPITOR) 80 MG tablet TAKE 1 TABLET BY MOUTH ONCE DAILY. 08/24/20  Yes [name], FNP  calcium carbonate (TUMS) 500 MG chewable tablet Chew 2 tablets (400 mg of elemental calcium total) by mouth 3 (three) times daily. 07/28/20  Yes Armandina Gemma, MD  carvedilol (COREG) 6.25 MG tablet Take 1 tablet (6.25 mg total) by mouth 2  (two) times daily. For BP and Heart 10/13/20  Yes Hendricks Limes F, FNP  donepezil (ARICEPT) 10 MG tablet TAKE (1) TABLET BY MOUTH AT BEDTIME. 10/13/20  Yes Hendricks Limes F, FNP  levothyroxine (SYNTHROID) 100 MCG tablet TAKE 1 TABLET BY MOUTH ONCE BEFORE BREAKFAST. 11/29/20  Yes Nida, Marella Chimes, MD  traMADol (ULTRAM) 50 MG tablet Take 1 tablet (50 mg total) by mouth every 6 (six) hours as needed. 12/14/20  Yes Delo, Nathaneil Canary, MD  traZODone (DESYREL) 50 MG tablet TAKE (1) TABLET BY MOUTH AT BEDTIME. 12/25/20  Yes [name], FNP    Allergies    Patient has no known allergies.  Review of Systems   Review of Systems  Neurological:  Positive for weakness.  All other systems reviewed and are negative.  Physical Exam Updated Vital Signs BP (!) 148/96 (BP Location: Right Arm)   Pulse 100   Temp 98.9 F (37.2 C) (Oral)   Resp 16   Ht 1.676 m (5\' 6" )   Wt 56 kg   SpO2 99%   BMI 19.93 kg/m   Physical Exam Vitals and nursing note reviewed.  Constitutional:      General: She is not in acute distress.    Appearance: She is well-developed.     Comments: Elderly, frail  HENT:     Head: Normocephalic and atraumatic.     Right Ear: External ear normal.     Left Ear: External ear normal.  Eyes:     General: No scleral icterus.       Right eye: No discharge.        Left eye: No discharge.     Conjunctiva/sclera: Conjunctivae normal.  Neck:     Trachea: No tracheal deviation.  Cardiovascular:     Rate and Rhythm: Normal rate and regular rhythm.  Pulmonary:     Effort: Pulmonary effort is normal. No respiratory distress.     Breath sounds: Normal breath sounds. No stridor. No wheezing or rales.  Abdominal:     General: Bowel sounds are normal. There is no distension.     Palpations: Abdomen is soft.     Tenderness: There is no abdominal tenderness. There is no guarding or rebound.  Musculoskeletal:        General: No tenderness or deformity.     Cervical back: Neck supple.   Skin:    General: Skin is warm and dry.     Findings: No rash.  Neurological:  GCS: GCS eye subscore is 4. GCS verbal subscore is 4. GCS motor subscore is 6.     Cranial Nerves: No cranial nerve deficit (no facial droop, extraocular movements intact, no slurred speech).     Sensory: No sensory deficit.     Motor: Weakness present. No abnormal muscle tone or seizure activity.     Coordination: Coordination normal.     Comments: Patient is alert and awake, confused about date and time, will move all extremities but has difficulty lifting legs off of the bed.  Speech is somewhat tangential and hard to understand but I think this is primarily related to her lack of teeth and absent dentures  Psychiatric:        Mood and Affect: Mood normal.    ED Results / Procedures / Treatments   Labs (all labs ordered are listed, but only abnormal results are displayed) Labs Reviewed  COMPREHENSIVE METABOLIC PANEL - Abnormal; Notable for the following components:      Result Value   Glucose, Bld 153 (*)    BUN 50 (*)    Creatinine, Ser 2.70 (*)    Alkaline Phosphatase 138 (*)    GFR, Estimated 18 (*)    All other components within normal limits  URINALYSIS, ROUTINE W REFLEX MICROSCOPIC - Abnormal; Notable for the following components:   Glucose, UA 50 (*)    Hgb urine dipstick SMALL (*)    Protein, ur >=300 (*)    Bacteria, UA RARE (*)    All other components within normal limits  BLOOD GAS, VENOUS - Abnormal; Notable for the following components:   pO2, Ven 45.9 (*)    All other components within normal limits  RESP PANEL BY RT-PCR (FLU A&B, COVID) ARPGX2  ETHANOL  PROTIME-INR  APTT  CBC  DIFFERENTIAL  RAPID URINE DRUG SCREEN, HOSP PERFORMED  AMMONIA    EKG EKG Interpretation  Date/Time:  Monday January 08 2021 11:35:22 EDT Ventricular Rate:  68 PR Interval:  183 QRS Duration: 102 QT Interval:  429 QTC Calculation: 457 R Axis:   -9 Text Interpretation: Sinus rhythm  Nonspecific T abnormalities, lateral leads Baseline wander in lead(s) II III aVF No significant change since last tracing Confirmed by Dorie Rank [phone]) on 01/08/2021 2:43:58 PM  Radiology CT HEAD WO CONTRAST  Result Date: 01/08/2021 CLINICAL DATA:  Generalized weakness. EXAM: CT HEAD WITHOUT CONTRAST TECHNIQUE: Contiguous axial images were obtained from the base of the skull through the vertex without intravenous contrast. COMPARISON:  Head CT, 09/02/2020. FINDINGS: Brain: No evidence of acute infarction, hemorrhage, hydrocephalus, extra-axial collection or mass lesion/mass effect. Bilateral periventricular white matter hypoattenuation is noted consistent with moderate to advanced chronic microvascular ischemic change. Vascular: No hyperdense vessel or unexpected calcification. Skull: Normal. Negative for fracture or focal lesion. Sinuses/Orbits: Visualized globes and orbits are unremarkable. Visualized sinuses are clear. Other: None. IMPRESSION: 1. No acute intracranial abnormalities. 2. Moderate to advanced chronic microvascular ischemic change, stable from the previous head CT. Electronically Signed   By: Lajean Manes M.D.   On: 01/08/2021 12:47    Procedures Procedures   Medications Ordered in ED Medications  sodium chloride 0.9 % bolus 500 mL (0 mLs Intravenous Stopped 01/08/21 1528)    Followed by  0.9 %  sodium chloride infusion (has no administration in time range)    ED Course  I have reviewed the triage vital signs and the nursing notes.  Pertinent labs & imaging results that were available during my care of the  patient were reviewed by me and considered in my medical decision making (see chart for details).  Clinical Course as of 01/08/21 1953  Mon Jan 08, 2021  1313 CBC is normal. [JK]  1313 CT without acute changes [JK]  1313 Creatinine elevated 2.7 but similar to previous values [JK]  1520 Discussed findings with son.  She is scheduled for further outpatient evaluation of  her kidney function.  We will make sure to check UA here [JK]  1543 Patient has been able to ambulate around the ED several times. [JK]    Clinical Course User Index [JK] Dorie Rank, MD   MDM Rules/Calculators/A&P                           Pt presented to the ED for evaluation of weakness.  Additional history by son.  Pt had been complaining of some leg pain.  Prior records reviewed and she has been seen for leg pain before.  No signs of vascular compromise or infection in th ED.  Pt may have issues with neuropathy.  ED workup without signs of infection, stroke, anemia, electrolyte abnormality.  Appears to be at her baseline.  Stable for discharge, outpt follow up Final Clinical Impression(s) / ED Diagnoses Final diagnoses:  Weakness    Rx / DC Orders ED Discharge Orders     None        Dorie Rank, MD 01/08/21 [phone]

## 2021-01-08 NOTE — ED Provider Notes (Signed)
3:50 PM-checkout from Dr. Tomi Bamberger to evaluate patient after return of urinalysis.  He suspects she can go home with a negative urine.  She has been ambulated and done well, she does with family members to help her with her care.  She has nonspecific leg discomfort felt to be possibly neuropathy.  Urinalysis results returned and are reassuring.  0-5 red and white blood cells per high-power field, with rare bacteria.  Additionally small amount of glucose and hemoglobin and protein are present.  The sample is not indicative of UTI in the absence of other concerning symptoms.  4:55 PM-alert nontoxic appearance.  No respiratory distress.  Abdomen soft nontender.  She is able to move extremities independently on command.  She is confused.  MDM-patient has been screened for acute problems.  The evaluation is reassuring.  Discharge vital signs are normal.  She has been ambulatory here.There is no indication for hospitalization or further ED intervention.   Daleen Bo, MD 01/08/21 [phone]

## 2021-01-08 NOTE — Discharge Instructions (Addendum)
The test today did not show any serious infections, or acute problems in the blood or vital signs.  Continue giving her her usual medicines.  Encouraged her to eat and drink regularly.  Watch her closely for signs of decompensation.  Consider following up with the primary care doctor within a week or 2 to ensure that she continues to do well.  Return here as needed for concerning symptoms.

## 2021-01-08 NOTE — ED Triage Notes (Signed)
Pt bib ems from home for decreased mobility and generalized weakness per family.  Pt is alert to self.  Confused to time, place.  Denies complaints.

## 2021-01-09 ENCOUNTER — Encounter: Payer: Self-pay | Admitting: Urology

## 2021-01-09 ENCOUNTER — Ambulatory Visit (INDEPENDENT_AMBULATORY_CARE_PROVIDER_SITE_OTHER): Payer: Medicare HMO | Admitting: Urology

## 2021-01-09 ENCOUNTER — Other Ambulatory Visit: Payer: Self-pay

## 2021-01-09 VITALS — BP 151/82 | HR 71 | Temp 98.7°F | Wt 120.2 lb

## 2021-01-09 DIAGNOSIS — N2889 Other specified disorders of kidney and ureter: Secondary | ICD-10-CM | POA: Diagnosis not present

## 2021-01-09 LAB — URINALYSIS, ROUTINE W REFLEX MICROSCOPIC
Bilirubin, UA: NEGATIVE
Glucose, UA: NEGATIVE
Ketones, UA: NEGATIVE
Leukocytes,UA: NEGATIVE
Nitrite, UA: NEGATIVE
Specific Gravity, UA: 1.025 (ref 1.005–1.030)
Urobilinogen, Ur: 0.2 mg/dL (ref 0.2–1.0)
pH, UA: 5.5 (ref 5.0–7.5)

## 2021-01-09 LAB — MICROSCOPIC EXAMINATION
Epithelial Cells (non renal): >10 /hpf — AB (ref 0–10)
Renal Epithel, UA: NONE SEEN /hpf

## 2021-01-09 NOTE — Patient Instructions (Signed)
Renal Mass  A renal mass is an abnormal growth in the kidney. It may be found while performing an MRI, CT scan, or ultrasound to evaluate other problems of the abdomen. A renal mass that is cancerous (malignant) may grow or spread quickly. Others are not cancerous (benign). Renal masses include: Tumors. These may be malignant or benign. The most common type of kidney cancer in adults is renal cell carcinoma. In children, the most common type of kidney cancer is Wilms tumor. The most common benign tumors of the kidney include renal adenomas, oncocytomas, and angiomyolipoma (AML). Cysts. These are fluid-filled sacs that form on or in the kidney. What are the causes? Certain types of cancers, infections, or injuries can cause a renal mass. It isnot always known what causes a cyst to develop in or on the kidney. What are the signs or symptoms? Often, a renal mass does not cause any signs or symptoms; most kidney cysts donot cause symptoms. How is this diagnosed? Your health care provider may recommend tests to diagnose the cause of your renal mass. These tests may be done if a renal mass is found: Physical exam. Blood tests. Urine tests. Imaging tests, such as ultrasound, CT scan, or MRI. Biopsy. This is a small sample that is removed from the renal mass and tested in a lab. The exact tests and how often they are done will depend on: The size and appearance of the renal mass. Risk factors or medical conditions that increase your risk for problems. Any symptoms associated with the renal mass, or concerns that you have about it. Tests and physical exams may be done once, or they may be done regularly for a period of time. Tests and exams that are done regularly will help monitorwhether the mass is growing and beginning to cause problems. How is this treated? Treatment is not always needed for this condition. Your health care provider may recommend careful monitoring and regular tests and exams.  Treatment willdepend on the cause of the mass. Treatment for a cancerous renal mass may include surgical removal,chemotherapy, radiation, or immunotherapy. Most kidney cysts do not need to be treated. Follow these instructions at home: What you need to do at home will depend on the cause of the mass. Follow the instructions that your health care provider gives to you. In general: Take over-the-counter and prescription medicines only as told by your health care provider. If you were prescribed an antibiotic medicine, take it as told by your health care provider. Do not stop taking the antibiotic even if you start to feel better. Follow any restrictions that are given to you by your health care provider. Keep all follow-up visits. This is important. You may need to see your health care provider once or twice a year to have CT scans and ultrasounds. These tests will show if your renal mass has changed or grown. Contact a health care provider if you: Have pain in your side or back (flank pain). Have a fever. Feel full soon after eating. Have pain or swelling in the abdomen. Lose weight. Get help right away if: Your pain gets worse. There is blood in your urine. You cannot urinate. You have chest pain. You have trouble breathing. These symptoms may represent a serious problem that is an emergency. Do not wait to see if the symptoms will go away. Get medical help right away. Call your local emergency services (911 in the [place] Summary A renal mass is an abnormal growth in the kidney.   It may be cancerous (malignant) and grow or spread quickly, or it may not be cancerous (benign). Renal masses often do not have any signs or symptoms. Renal masses may be found while performing an MRI, CT scan, or ultrasound for other problems of the abdomen. Your health care provider may recommend that you have tests to diagnose the cause of your renal mass. These may include a physical exam, blood tests, urine  tests, imaging, or a biopsy. Treatment is not always needed for this condition. Careful monitoring may be recommended. This information is not intended to replace advice given to you by your health care provider. Make sure you discuss any questions you have with your healthcare provider. Document Revised: 09/20/2019 Document Reviewed: 09/20/2019 Elsevier Patient Education  2022 [employer] Inc.  

## 2021-01-09 NOTE — Progress Notes (Signed)
01/09/2021 3:23 PM   Erin Jordan Dec 30, 1945 [id number]  Referring provider: [name], [place],  [place] [id number]  Followup right renal mass.    HPI: Erin Jordan is a 75yo here for followup for a right renal mass. No flank pain. No LUTS. No hematuria. She underwent CT 5/28 which showed a 4cm right renal mass which is stable in size. 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   History of diabetes mellitus, type II    Hyperlipidemia    Hypothyroidism    NSTEMI (non-ST elevated myocardial infarction) ([place]) 06/24/2017   Pneumonia    hx of 2021    Renal hematoma    Right renal mass    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    Surgical History: Past Surgical History:  Procedure Laterality Date   ABDOMINAL HYSTERECTOMY     CORONARY ARTERY BYPASS GRAFT  2006   [place], [place] N/A 06/25/2017   Procedure: CORONARY STENT INTERVENTION;  Surgeon: Jettie Booze, MD;  Location: [facility];  Service: Cardiovascular;  Laterality: N/A;   IR RADIOLOGIST EVAL & MGMT  07/08/2019   LEFT HEART CATH AND CORS/GRAFTS ANGIOGRAPHY N/A 06/25/2017   Procedure: LEFT HEART CATH AND CORS/GRAFTS ANGIOGRAPHY;  Surgeon: Jettie Booze, MD;  Location: [facility];  Service: Cardiovascular;  Laterality: N/A;   THYROIDECTOMY N/A 07/28/2020   Procedure: TOTAL THYROIDECTOMY;  Surgeon: Armandina Gemma, MD;  Location: [facility];  Service: General;  Laterality: N/A;  2 HOURS    Home Medications:  Allergies as of 01/09/2021   No Known Allergies      Medication List         Accurate as of January 09, 2021  3:23 PM. If you have any questions, ask your nurse or doctor.          amLODipine 10 MG tablet Commonly known as: NORVASC Take 1 tablet (10 mg total) by mouth daily.   aspirin EC 81 MG tablet Take 1 tablet (81 mg total) by mouth daily. Swallow whole.   atorvastatin 80 MG tablet Commonly known as: LIPITOR TAKE 1 TABLET BY MOUTH ONCE DAILY.   calcium carbonate 500 MG chewable tablet Commonly known as: Tums Chew 2 tablets (400 mg of elemental calcium total) by mouth 3 (three) times daily.   carvedilol 6.25 MG tablet Commonly known as: COREG Take 1 tablet (6.25 mg total) by mouth 2 (two) times daily. For BP and Heart   donepezil 10 MG tablet Commonly known as: ARICEPT TAKE (1) TABLET BY MOUTH AT BEDTIME.   levothyroxine 100 MCG tablet Commonly known as: SYNTHROID TAKE 1 TABLET BY MOUTH ONCE BEFORE BREAKFAST.   traMADol 50 MG tablet Commonly known as: ULTRAM Take 1 tablet (50 mg total) by mouth every 6 (six) hours as needed.   traZODone 50 MG tablet Commonly known as: DESYREL TAKE (1) TABLET BY MOUTH AT BEDTIME.        Allergies: No Known Allergies  Family History: Family History  Problem Relation Age of Onset   Heart attack  Mother    Hypertension Mother    Seizures Son    Seizures Son    Seizures Daughter     Social History:  reports that she has never smoked. She has never used smokeless tobacco. She reports that she does not drink alcohol and does not use drugs.  ROS: All other review of systems were reviewed and are negative except what is noted above in HPI  Physical Exam: BP (!) 151/82 (BP Location: Left Arm, Patient Position: Sitting, Cuff Size: Normal)   Pulse 71   Temp 98.7 F (37.1 C)   Wt 120 lb 3.2 oz (54.5 kg)   BMI 19.40 kg/m   Constitutional:  Alert and oriented, No acute distress. HEENT: [place] AT, moist mucus membranes.  Trachea midline, no masses. Cardiovascular: No clubbing, cyanosis, or  edema. Respiratory: Normal respiratory effort, no increased work of breathing. GI: Abdomen is soft, nontender, nondistended, no abdominal masses GU: No CVA tenderness.  Lymph: No cervical or inguinal lymphadenopathy. Skin: No rashes, bruises or suspicious lesions. Neurologic: Grossly intact, no focal deficits, moving all 4 extremities. Psychiatric: Normal mood and affect.  Laboratory Data: Lab Results  Component Value Date   WBC 7.6 01/08/2021   HGB 12.8 01/08/2021   HCT 38.5 01/08/2021   MCV 89.5 01/08/2021   PLT 223 01/08/2021    Lab Results  Component Value Date   CREATININE 2.70 (H) 01/08/2021    No results found for: PSA  No results found for: TESTOSTERONE  Lab Results  Component Value Date   HGBA1C 5.4 07/19/2020    Urinalysis    Component Value Date/Time   COLORURINE YELLOW 01/08/2021 1613   APPEARANCEUR CLEAR 01/08/2021 1613   APPEARANCEUR Clear 06/21/2020 1351   LABSPEC 1.012 01/08/2021 1613   PHURINE 5.0 01/08/2021 1613   GLUCOSEU 50 (A) 01/08/2021 1613   HGBUR SMALL (A) 01/08/2021 1613   BILIRUBINUR NEGATIVE 01/08/2021 1613   BILIRUBINUR Negative 06/21/2020 1351   KETONESUR NEGATIVE 01/08/2021 1613   PROTEINUR >=300 (A) 01/08/2021 1613   UROBILINOGEN 0.2 05/19/2019 0947   UROBILINOGEN 0.2 06/01/2011 1822   NITRITE NEGATIVE 01/08/2021 1613   LEUKOCYTESUR NEGATIVE 01/08/2021 1613    Lab Results  Component Value Date   LABMICR Comment 06/21/2020   WBCUA 0-5 12/22/2019   LABEPIT >10 (A) 12/22/2019   BACTERIA RARE (A) 01/08/2021    Pertinent Imaging: CT 5/28: Images reviewed and discussed with the patient  No results found for this or any previous visit.  Results for orders placed during the hospital encounter of 08/10/19  US Venous Img Lower Bilateral (DVT)  Narrative CLINICAL DATA:  Pain in bilateral lower extremities for 1 week  EXAM: BILATERAL LOWER EXTREMITY VENOUS DOPPLER ULTRASOUND  TECHNIQUE: Jordan-scale sonography with graded  compression, as well as color Doppler and duplex ultrasound were performed to evaluate the lower extremity deep venous systems from the level of the common femoral vein and including the common femoral, femoral, profunda femoral, popliteal and calf veins including the posterior tibial, peroneal and gastrocnemius veins when visible. The superficial great saphenous vein was also interrogated. Spectral Doppler was utilized to evaluate flow at rest and with distal augmentation maneuvers in the common femoral, femoral and popliteal veins.  COMPARISON:  None.  FINDINGS: RIGHT LOWER EXTREMITY  Common Femoral Vein: No evidence of thrombus. Normal compressibility, respiratory phasicity and response to augmentation.  Saphenofemoral Junction: No evidence of thrombus. Normal compressibility and flow on color Doppler imaging.  Profunda Femoral Vein: No evidence of thrombus. Normal compressibility  and flow on color Doppler imaging.  Femoral Vein: No evidence of thrombus. Normal compressibility, respiratory phasicity and response to augmentation.  Popliteal Vein: No evidence of thrombus. Normal compressibility, respiratory phasicity and response to augmentation.  Calf Veins: No evidence of thrombus. Normal compressibility and flow on color Doppler imaging.  Superficial Great Saphenous Vein: No evidence of thrombus. Normal compressibility.  Venous Reflux:  None.  Other Findings:  None.  LEFT LOWER EXTREMITY  Common Femoral Vein: No evidence of thrombus. Normal compressibility, respiratory phasicity and response to augmentation.  Saphenofemoral Junction: No evidence of thrombus. Normal compressibility and flow on color Doppler imaging.  Profunda Femoral Vein: No evidence of thrombus. Normal compressibility and flow on color Doppler imaging.  Femoral Vein: No evidence of thrombus. Normal compressibility, respiratory phasicity and response to augmentation.  Popliteal Vein: No  evidence of thrombus. Normal compressibility, respiratory phasicity and response to augmentation.  Calf Veins: No evidence of thrombus. Normal compressibility and flow on color Doppler imaging.  Superficial Great Saphenous Vein: No evidence of thrombus. Normal compressibility.  Venous Reflux:  None.  Other Findings:  None.  IMPRESSION: No evidence of deep venous thrombosis in either lower extremity.   Electronically Signed By: Randa Ngo M.D. On: 08/10/2019 16:09  No results found for this or any previous visit.  No results found for this or any previous visit.  Results for orders placed during the hospital encounter of 10/15/18  US RENAL  Narrative CLINICAL DATA:  Chronic kidney disease  EXAM: RENAL / URINARY TRACT ULTRASOUND COMPLETE  COMPARISON:  CT dated April 24, 2018.  FINDINGS: Right Kidney:  Renal measurements: 7.3 x 3.4 x 4.4 cm = volume: 57.9 mL. There is a solid mass in the interpolar region measuring 3.2 x 3.6 x 3.2 cm. There is no hydronephrosis. No shadowing echogenic kidney stones.  Left Kidney:  Renal measurements: 10.1 x 3.9 x 5 cm = volume: 103 mL. Echogenicity within normal limits. No mass or hydronephrosis visualized.  Bladder:  Bladder was underdistended which severely limits evaluation.  IMPRESSION: 1. Solid right renal mass measuring approximately 3.6 cm. This is concerning for renal cell carcinoma until proven otherwise. Urologic follow-up is recommended. 2. No hydronephrosis. 3. Underdistended urinary bladder which limits evaluation.   Electronically Signed By: Constance Holster M.D. On: 10/15/2018 19:23  No results found for this or any previous visit.  No results found for this or any previous visit.  No results found for this or any previous visit.   Assessment & Plan:    1. Right renal mass -RTC 6 months with renal US   No follow-ups on file.  Nicolette Bang, MD  [facility] [place]

## 2021-01-09 NOTE — Addendum Note (Signed)
Addended by: Iris Pert on: 01/09/2021 04:30 PM   Modules accepted: Orders

## 2021-01-09 NOTE — Progress Notes (Signed)
Urological Symptom Review  Patient is experiencing the following symptoms: Frequent urination Burning/pain with urination Get up at night to urinate Injury to kidneys/bladder   Review of Systems  Gastrointestinal (upper)  : Negative for upper GI symptoms  Gastrointestinal (lower) : Negative for lower GI symptoms  Constitutional : Weight loss Fatigue  Skin: Negative for skin symptoms  Eyes: Negative for eye symptoms  Ear/Nose/Throat : Sinus problems  Hematologic/Lymphatic: Negative for Hematologic/Lymphatic symptoms  Cardiovascular : Negative for cardiovascular symptoms  Respiratory : Negative for respiratory symptoms  Endocrine: Negative for endocrine symptoms  Musculoskeletal: Joint pain  Neurological: Headaches Dizziness  Psychologic: Negative for psychiatric symptoms

## 2021-01-11 ENCOUNTER — Other Ambulatory Visit: Payer: Self-pay

## 2021-01-11 ENCOUNTER — Ambulatory Visit (INDEPENDENT_AMBULATORY_CARE_PROVIDER_SITE_OTHER): Payer: Medicare HMO | Admitting: Family Medicine

## 2021-01-11 ENCOUNTER — Encounter: Payer: Self-pay | Admitting: Family Medicine

## 2021-01-11 VITALS — BP 156/77 | HR 79 | Temp 97.0°F | Ht 66.0 in | Wt 121.0 lb

## 2021-01-11 DIAGNOSIS — Z780 Asymptomatic menopausal state: Secondary | ICD-10-CM

## 2021-01-11 DIAGNOSIS — Z23 Encounter for immunization: Secondary | ICD-10-CM | POA: Diagnosis not present

## 2021-01-11 DIAGNOSIS — F0390 Unspecified dementia without behavioral disturbance: Secondary | ICD-10-CM | POA: Diagnosis not present

## 2021-01-11 DIAGNOSIS — I10 Essential (primary) hypertension: Secondary | ICD-10-CM

## 2021-01-11 DIAGNOSIS — M8580 Other specified disorders of bone density and structure, unspecified site: Secondary | ICD-10-CM | POA: Diagnosis not present

## 2021-01-11 DIAGNOSIS — E785 Hyperlipidemia, unspecified: Secondary | ICD-10-CM

## 2021-01-11 DIAGNOSIS — Z8639 Personal history of other endocrine, nutritional and metabolic disease: Secondary | ICD-10-CM

## 2021-01-11 LAB — LIPID PANEL
Chol/HDL Ratio: 2.5 ratio (ref 0.0–4.4)
Cholesterol, Total: 250 mg/dL — ABNORMAL HIGH (ref 100–199)
HDL: 99 mg/dL (ref >39)
LDL Chol Calc (NIH): 138 mg/dL — ABNORMAL HIGH (ref 0–99)
Triglycerides: 78 mg/dL (ref 0–149)
VLDL Cholesterol Cal: 13 mg/dL (ref 5–40)

## 2021-01-11 LAB — BAYER DCA HB A1C WAIVED: HB A1C (BAYER DCA - WAIVED): 6.3 % — ABNORMAL HIGH (ref 4.8–5.6)

## 2021-01-11 MED ORDER — CARVEDILOL 12.5 MG PO TABS: 12.5000 mg | ORAL_TABLET | Freq: Two times a day (BID) | ORAL | 1 refills | Status: DC

## 2021-01-11 NOTE — Progress Notes (Signed)
Assessment & Plan:  1. Essential hypertension - increase coreg to 12.5 mg BID per cardiology's previous order, recommendation of nephrologist, and continued uncontrolled BP readings - carvedilol (COREG) 12.5 MG tablet; Take 1 tablet (12.5 mg total) by mouth 2 (two) times daily. For BP and Heart  Dispense: 180 tablet; Refill: 1  2. Dementia without behavioral disturbance ([place]) - continue Aricept - son does not want patient placed in a facility - son provided information about hiring a home health aid independently and not filing through insurance  3. Osteopenia, unspecified location - DEXA at next visit - unable to obtain today as tech is out of the office  4. Dyslipidemia, goal LDL below 70 - continue statin - Lipid panel  5. History of diabetes mellitus - controlled with diet, will check A1C today - [employer] DCA Hb A1c Waived  6. Postmenopausal estrogen deficiency - DG [facility] DEXA  7. Need for immunization against influenza - influenza vaccine today   Return in about 3 months (around 04/13/2021) for follow-up of chronic medication conditions.  Erin Crater, NP Student  I personally was present during the history, physical exam, and medical decision-making activities of this service and have verified that the service and findings are accurately documented in the nurse practitioner student's note.  Hendricks Limes, MSN, APRN, FNP-C [facility]   Subjective:    Patient ID: Erin Jordan, female    DOB: 21-May-1945, 75 y.o.   MRN: [id number]  Patient Care Team: [name], FNP as PCP - General (Family Medicine) Satira Sark, MD as PCP - Cardiology (Cardiology) Donetta Potts, RN as Oncology Nurse Navigator Derek Jack, MD as Consulting Physician (Hematology) Satira Sark, MD as Consulting Physician (Cardiology) Cassandria Anger, MD as Consulting Physician (Endocrinology) Alyson Ingles Candee Furbish, MD as Consulting Physician  (Urology)   Chief Complaint:  Chief Complaint  Patient presents with   F2F    [facility]    HPI: Erin Jordan is a 75 y.o. female presenting on 01/11/2021 for F2F [facility])  Patient presents to the exam room today with her son, Erin Jordan. Typically she is accompanied by her granddaughter Erin Jordan. She has had 3 admissions to the ER for various complaints since her last visit, including generalized weakness, leg pain, abdominal pain, and behavioral disturbances. She is presenting today for an evaluation for home health services for help while the family is at work and to be with her due to her dementia. Unfortunately with her insurance she is not a candidate for in home services like what her son is requesting to be covered by insurance (cooking, cleaning, errands, etc.). Her son states that they do not wish for her to be in skilled care and that there are many family members and neighbors available to help.   Hypertension: According to cardiology notes patient is supposed to be taking Coreg 12.5 mg twice daily.  Per the medication tabs off of her bubble pack that she brought with her today, she is only taking 6.25 mg twice daily.  CAD: Patient is taking atorvastatin 80 mg once daily and aspirin 81 mg.   Dementia: She is taking Aricept 10 mg at bedtime.  CKD: She saw Dr. Theador Hawthorne in September, who recommended her increasing the Coreg to 12.5 mg. He is in the process of working her kidney function up and will see her back in a month.   Health maintenance: She is due for DEXA and influenza vaccine today.  New complaints: None  Social history:  Relevant past medical, surgical, family and social history reviewed and updated as indicated. Interim medical history since our last visit reviewed.  Allergies and medications reviewed and updated.  DATA REVIEWED: CHART IN EPIC  ROS: Negative unless specifically indicated above in HPI.    Current Outpatient Medications:    amLODipine (NORVASC) 10 MG tablet,  Take 1 tablet (10 mg total) by mouth daily., Disp: 30 tablet, Rfl: 5   aspirin EC 81 MG tablet, Take 1 tablet (81 mg total) by mouth daily. Swallow whole., Disp: 30 tablet, Rfl: 5   atorvastatin (LIPITOR) 80 MG tablet, TAKE 1 TABLET BY MOUTH ONCE DAILY., Disp: 30 tablet, Rfl: 5   donepezil (ARICEPT) 10 MG tablet, TAKE (1) TABLET BY MOUTH AT BEDTIME., Disp: 30 tablet, Rfl: 5   levothyroxine (SYNTHROID) 100 MCG tablet, TAKE 1 TABLET BY MOUTH ONCE BEFORE BREAKFAST., Disp: 90 tablet, Rfl: 1   traMADol (ULTRAM) 50 MG tablet, Take 1 tablet (50 mg total) by mouth every 6 (six) hours as needed., Disp: 15 tablet, Rfl: 0   traZODone (DESYREL) 50 MG tablet, TAKE (1) TABLET BY MOUTH AT BEDTIME., Disp: 90 tablet, Rfl: 0   carvedilol (COREG) 12.5 MG tablet, Take 1 tablet (12.5 mg total) by mouth 2 (two) times daily. 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/2012   History of diabetes mellitus, type II    Hyperlipidemia    Hypothyroidism    NSTEMI (non-ST elevated myocardial infarction) ([place]) 06/24/2017   Pneumonia    hx of 2021    Renal hematoma    Right renal mass    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    Past Surgical History:  Procedure Laterality Date   ABDOMINAL HYSTERECTOMY     CORONARY ARTERY BYPASS GRAFT  2006   [place], [place] N/A 06/25/2017   Procedure: CORONARY STENT INTERVENTION;  Surgeon: Jettie Booze, MD;  Location: [facility];  Service: Cardiovascular;  Laterality: N/A;   IR RADIOLOGIST EVAL & MGMT  07/08/2019   LEFT HEART CATH AND CORS/GRAFTS ANGIOGRAPHY  N/A 06/25/2017   Procedure: LEFT HEART CATH AND CORS/GRAFTS ANGIOGRAPHY;  Surgeon: Jettie Booze, MD;  Location: [facility];  Service: Cardiovascular;  Laterality: N/A;   THYROIDECTOMY N/A 07/28/2020   Procedure: TOTAL THYROIDECTOMY;  Surgeon: Armandina Gemma, MD;  Location: [facility];  Service: General;  Laterality: N/A;  2 HOURS    Social History   Socioeconomic History   Marital status: Widowed    Spouse name: Not on file   Number of children: 6   Years of education: Not on file   Highest education level: Not on file  Occupational History   Occupation: retired  Tobacco Use   Smoking status: Never   Smokeless tobacco: Never  Vaping Use   Vaping Use: Never used  Substance and Sexual Activity   Alcohol use: No   Drug use: No   Sexual activity: Never  Other Topics Concern   Not on file  Social History Narrative   Lives in [place]  with her daughter and grandchildren.   She has dementia   Social Determinants of Radio broadcast assistant Strain: Medium Risk   Difficulty of Paying Living Expenses: Somewhat hard  Food Insecurity: No Food Insecurity   Worried About Charity fundraiser in the Last Year: Never true   Ran Out of Food in the Last Year: Never true  Transportation Needs: No Transportation Needs   Lack of Transportation (Medical): No   Lack of Transportation (Non-Medical): No  Physical Activity: Insufficiently Active   Days of Exercise per Week: 7 days   Minutes of Exercise per Session: 20 min  Stress: No Stress Concern Present   Feeling of Stress : Only a little  Social Connections: Moderately Isolated   Frequency of Communication with Friends and Family: More than three times a week   Frequency of Social Gatherings with Friends and Family: More than three times a week   Attends Religious Services: 1 to 4 times per year   Active Member of [employer] or Organizations: No   Attends Archivist Meetings: Never   Marital Status: Widowed  Arboriculturist Violence: Not At Risk   Fear of Current or Ex-Partner: No   Emotionally Abused: No   Physically Abused: No   Sexually Abused: No        Objective:    BP (!) 156/77   Pulse 79   Temp (!) 97 F (36.1 C) (Temporal)   Ht '5\' 6"'  (1.676 m)   Wt 54.9 kg   SpO2 100%   BMI 19.53 kg/m   Wt Readings from Last 3 Encounters:  01/11/21 121 lb (54.9 kg)  01/09/21 120 lb 3.2 oz (54.5 kg)  01/08/21 123 lb 7.3 oz (56 kg)    Physical Exam Vitals reviewed.  Constitutional:      General: She is not in acute distress.    Appearance: Normal appearance. She is underweight. She is not ill-appearing, toxic-appearing or diaphoretic.  HENT:     Head: Normocephalic and atraumatic.  Eyes:     General: No scleral icterus.       Right eye: No discharge.        Left eye: No discharge.     Conjunctiva/sclera: Conjunctivae normal.  Cardiovascular:     Rate and Rhythm: Normal rate and regular rhythm.     Heart sounds: Normal heart sounds. No murmur heard.   No friction rub. No gallop.  Pulmonary:     Effort: Pulmonary effort is normal. No respiratory distress.     Breath sounds: Normal breath sounds. No stridor. No wheezing, rhonchi or rales.  Musculoskeletal:        General: Normal range of motion.     Cervical back: Normal range of motion.  Skin:    General: Skin is warm and dry.     Capillary Refill: Capillary refill takes less than 2 seconds.  Neurological:     General: No focal deficit present.     Mental Status: She is alert and oriented to person, place, and time. Mental status is at baseline.  Psychiatric:        Mood and Affect: Mood normal.        Behavior: Behavior normal.        Thought Content: Thought content normal.        Judgment: Judgment normal.    Lab Results  Component Value Date   TSH 5.500 (H) 10/13/2020   Lab Results  Component Value Date   WBC  7.6 01/08/2021   HGB 12.8 01/08/2021   HCT 38.5 01/08/2021   MCV 89.5 01/08/2021   PLT 223 01/08/2021    Lab Results  Component Value Date   NA 142 01/08/2021   K 3.9 01/08/2021   CO2 25 01/08/2021   GLUCOSE 153 (H) 01/08/2021   BUN 50 (H) 01/08/2021   CREATININE 2.70 (H) 01/08/2021   BILITOT 0.9 01/08/2021   ALKPHOS 138 (H) 01/08/2021   AST 20 01/08/2021   ALT 20 01/08/2021   PROT 7.3 01/08/2021   ALBUMIN 3.9 01/08/2021   CALCIUM 9.4 01/08/2021   ANIONGAP 9 01/08/2021   EGFR 21 (L) 10/13/2020   Lab Results  Component Value Date   CHOL 264 (H) 10/13/2020   Lab Results  Component Value Date   HDL 98 10/13/2020   Lab Results  Component Value Date   LDLCALC 154 (H) 10/13/2020   Lab Results  Component Value Date   TRIG 76 10/13/2020   Lab Results  Component Value Date   CHOLHDL 2.7 10/13/2020   Lab Results  Component Value Date   HGBA1C 5.4 07/19/2020

## 2021-01-12 ENCOUNTER — Other Ambulatory Visit: Payer: Self-pay | Admitting: Family Medicine

## 2021-01-12 ENCOUNTER — Encounter: Payer: Self-pay | Admitting: Family Medicine

## 2021-01-12 DIAGNOSIS — E785 Hyperlipidemia, unspecified: Secondary | ICD-10-CM

## 2021-01-12 MED ORDER — EZETIMIBE 10 MG PO TABS: 10.0000 mg | ORAL_TABLET | Freq: Every day | ORAL | 2 refills | Status: DC

## 2021-02-05 ENCOUNTER — Ambulatory Visit (HOSPITAL_COMMUNITY)
Admission: RE | Admit: 2021-02-05 | Discharge: 2021-02-05 | Disposition: A | Discharge status: Home / Self Care | Payer: Medicare HMO | Source: Ambulatory Visit | Attending: Urology | Admitting: Urology

## 2021-02-05 ENCOUNTER — Other Ambulatory Visit: Payer: Self-pay

## 2021-02-05 DIAGNOSIS — N2889 Other specified disorders of kidney and ureter: Secondary | ICD-10-CM | POA: Insufficient documentation

## 2021-02-05 IMAGING — US US RENAL
1 series · 14 of 25 positions shown · non-contrast
Comparison: Renal ultrasound dated [DATE] and CT abdomen pelvis
dated [DATE].

CLINICAL DATA: Right renal mass.

EXAM:
RENAL / URINARY TRACT ULTRASOUND COMPLETE

[Series 1: us renal · 14 of 89 slices shown]
[im 1/89]
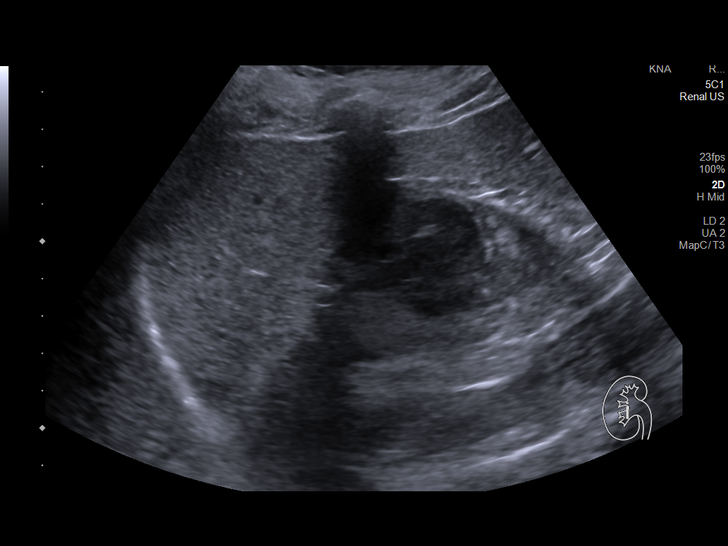
[im 8/89]
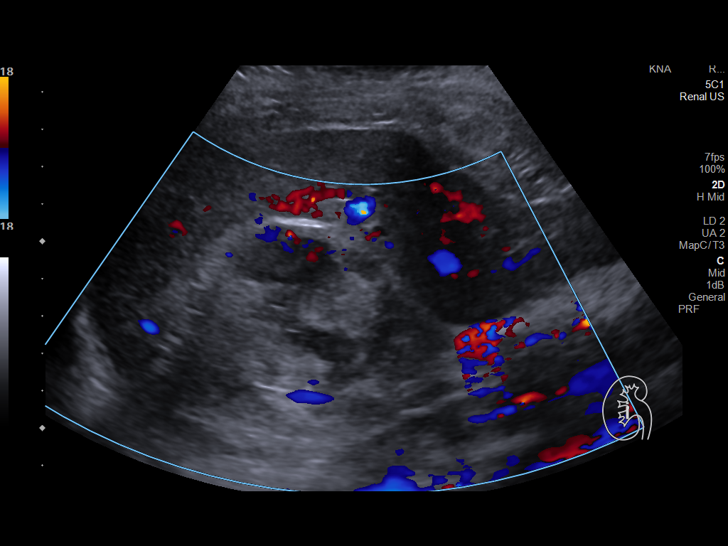
[im 15/89]
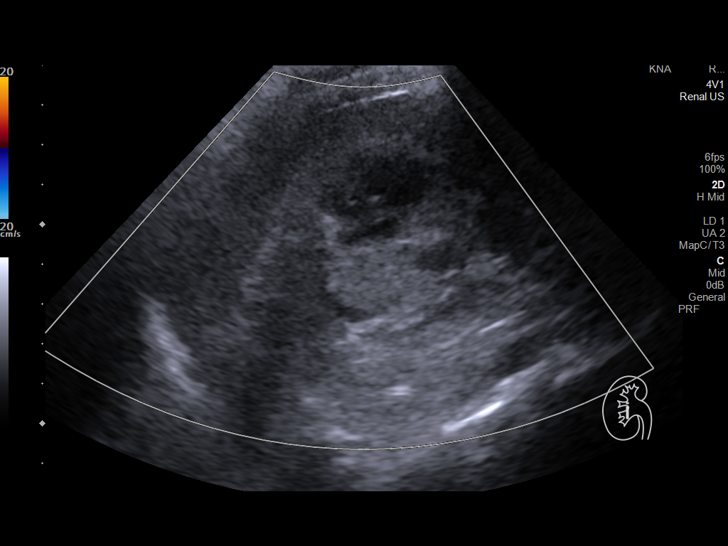
[im 23/89]
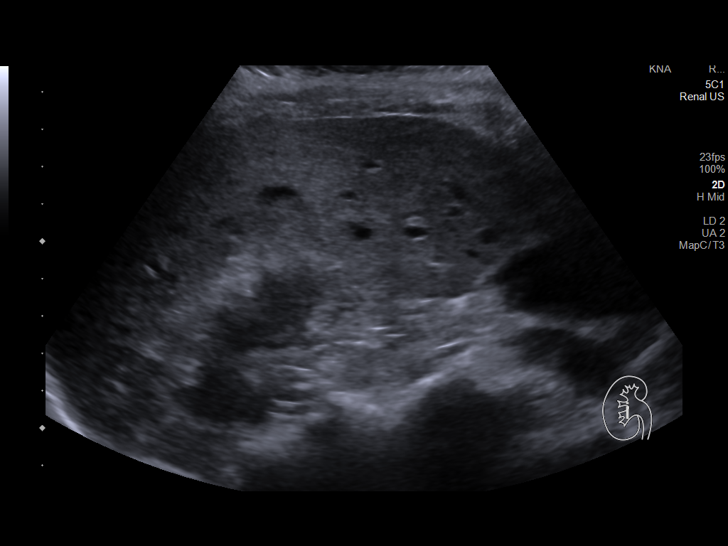
[im 30/89]
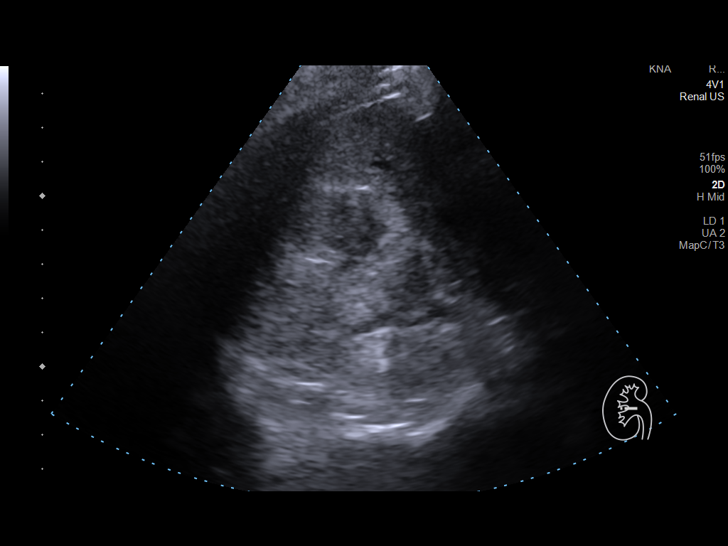
[im 34/89]
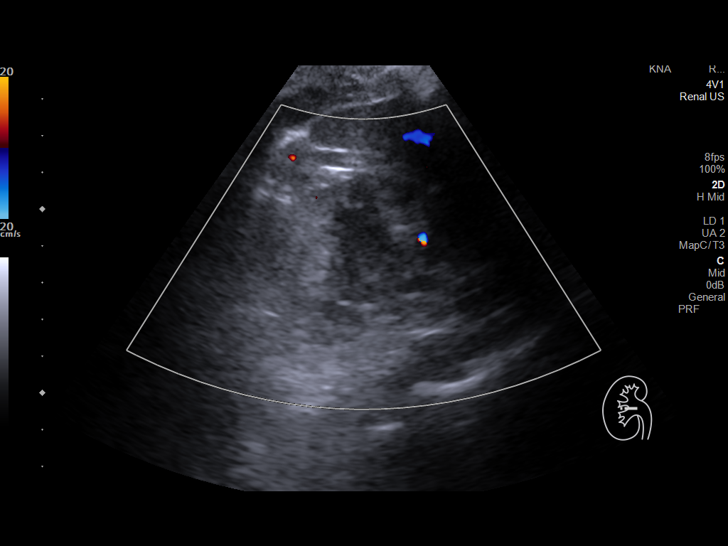
[im 41/89]
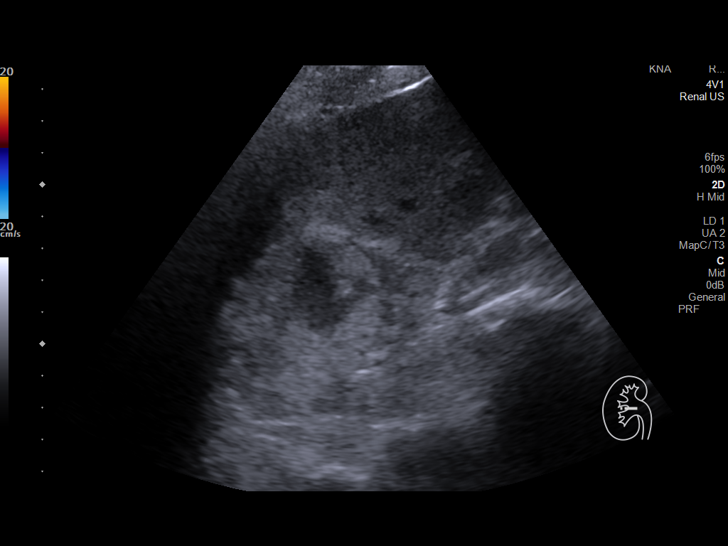
[im 48/89]
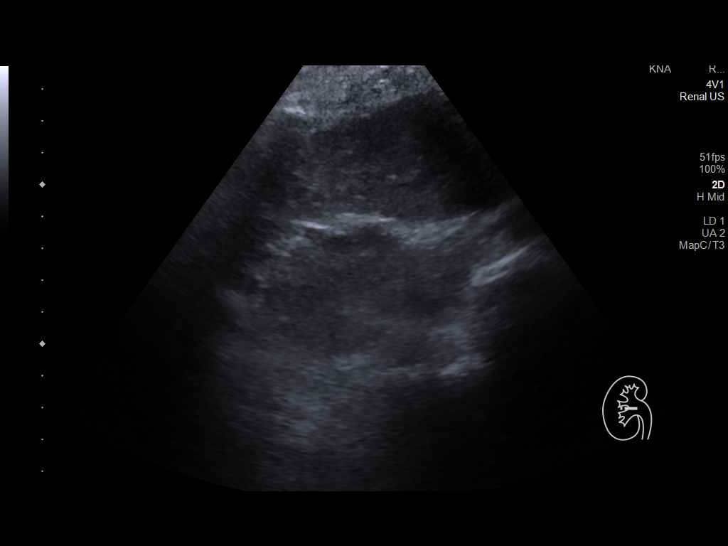
[im 56/89]
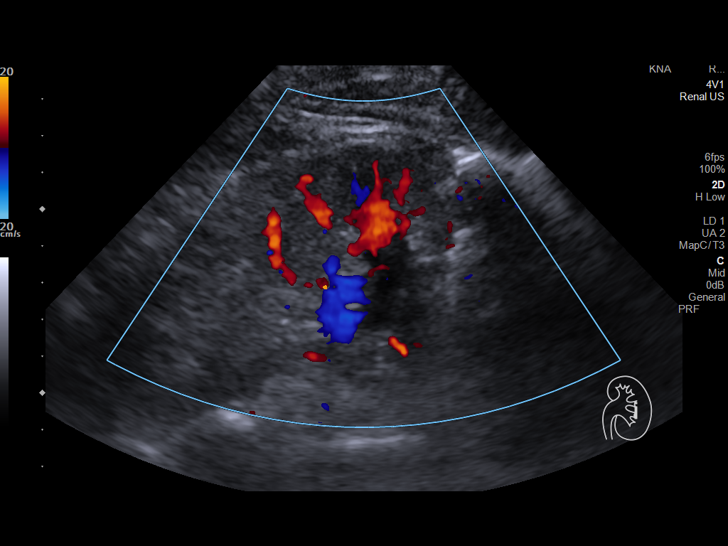
[im 59/89]
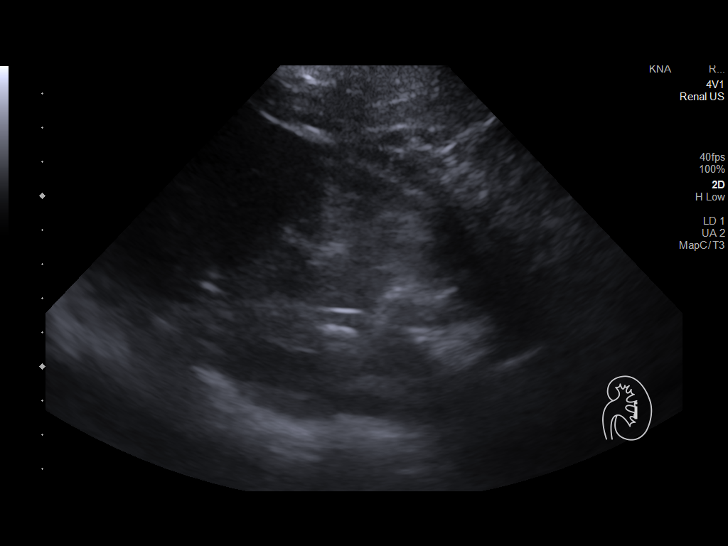
[im 67/89]
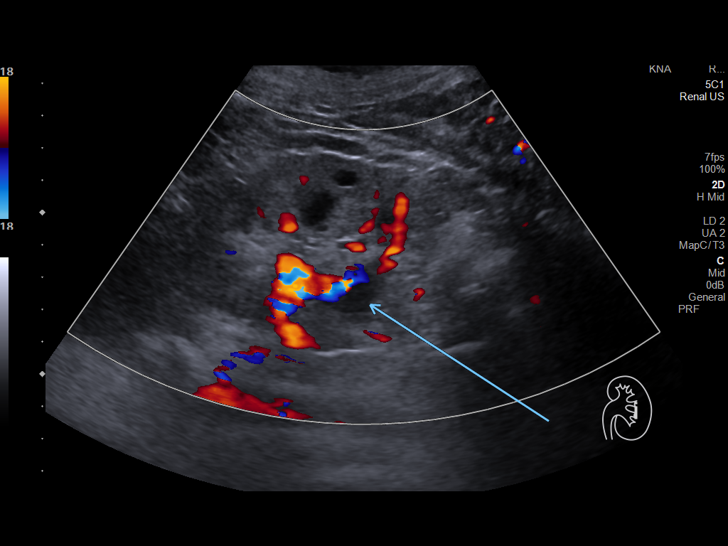
[im 74/89]
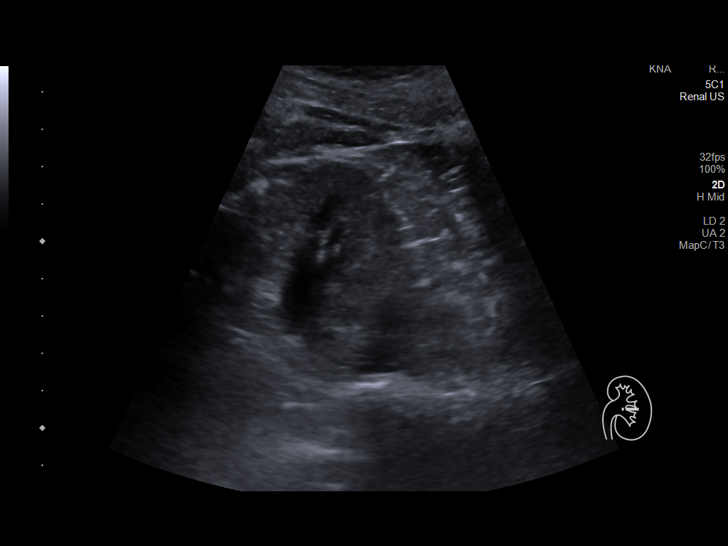
[im 81/89]
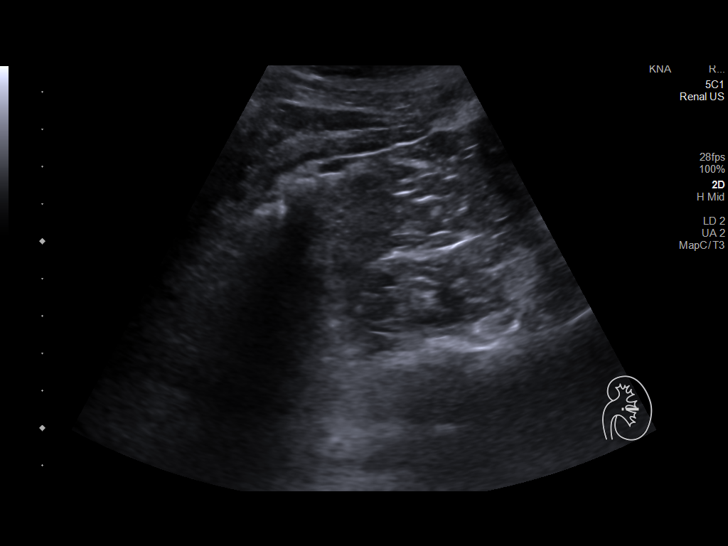
[im 89/89]
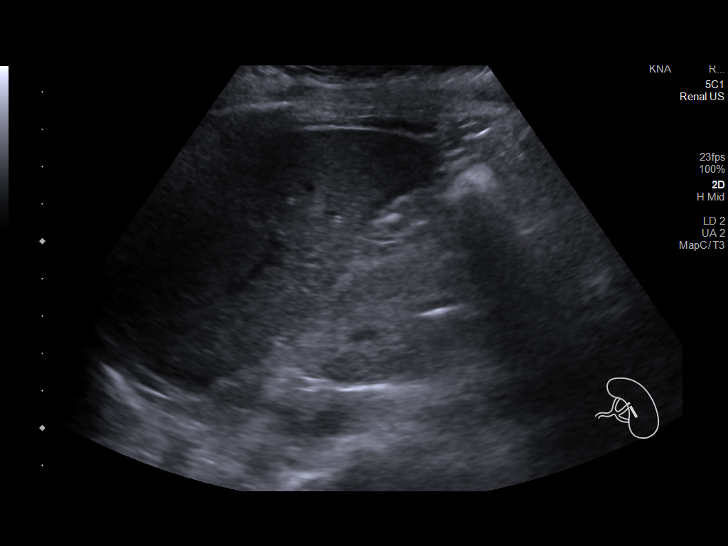

[14 of 25 positions shown; findings below may reference images not displayed]

FINDINGS: Evaluation is limited due to body habitus and overlying bowel gas.

Right Kidney:

Renal measurements: 7.6 x 5.4 x 6.0 cm = volume: 130 mL. The right
kidney demonstrates a heterogeneous echotexture. Ill-defined and
poorly visualized right renal mass measures approximately 5.6 x
x 5.6 cm (previously 3.2 x 3.6 x 3.2 cm). Evaluation however is
limited due to suboptimal visualization. No hydronephrosis or
shadowing stone.

Left Kidney:

Renal measurements: 9.8 x 5.8 x 5.1 cm = volume: 143 mL. The left
kidney suboptimally visualized. There is diffuse increased renal
parenchymal echogenicity. There is mild left hydronephrosis.

Bladder:

The urinary bladder is minimally distended and suboptimally
evaluated.

Other:

None.
IMPRESSION: 1. Known right renal mass, suboptimally evaluated.
2. Echogenic kidneys likely related to chronic kidney disease.
3. Mild left hydronephrosis.

## 2021-02-21 ENCOUNTER — Other Ambulatory Visit: Payer: Self-pay | Admitting: Family Medicine

## 2021-02-21 ENCOUNTER — Other Ambulatory Visit: Payer: Self-pay | Admitting: "Endocrinology

## 2021-02-21 DIAGNOSIS — E785 Hyperlipidemia, unspecified: Secondary | ICD-10-CM

## 2021-02-27 ENCOUNTER — Other Ambulatory Visit (HOSPITAL_COMMUNITY): Payer: Self-pay

## 2021-02-27 DIAGNOSIS — N2889 Other specified disorders of kidney and ureter: Secondary | ICD-10-CM

## 2021-02-28 ENCOUNTER — Other Ambulatory Visit: Payer: Self-pay

## 2021-02-28 ENCOUNTER — Inpatient Hospital Stay (HOSPITAL_COMMUNITY): Payer: Medicare HMO | Attending: Hematology

## 2021-02-28 DIAGNOSIS — F0390 Unspecified dementia without behavioral disturbance: Secondary | ICD-10-CM | POA: Diagnosis not present

## 2021-02-28 DIAGNOSIS — C641 Malignant neoplasm of right kidney, except renal pelvis: Secondary | ICD-10-CM | POA: Insufficient documentation

## 2021-02-28 DIAGNOSIS — N2889 Other specified disorders of kidney and ureter: Secondary | ICD-10-CM

## 2021-02-28 DIAGNOSIS — D472 Monoclonal gammopathy: Secondary | ICD-10-CM | POA: Diagnosis not present

## 2021-02-28 LAB — CBC WITH DIFFERENTIAL/PLATELET
Abs Immature Granulocytes: 0.02 10*3/uL (ref 0.00–0.07)
Basophils Absolute: 0 10*3/uL (ref 0.0–0.1)
Basophils Relative: 1 %
Eosinophils Absolute: 0.2 10*3/uL (ref 0.0–0.5)
Eosinophils Relative: 3 %
HCT: 37.1 % (ref 36.0–46.0)
Hemoglobin: 12.5 g/dL (ref 12.0–15.0)
Immature Granulocytes: 0 %
Lymphocytes Relative: 23 %
Lymphs Abs: 1.4 10*3/uL (ref 0.7–4.0)
MCH: 30 pg (ref 26.0–34.0)
MCHC: 33.7 g/dL (ref 30.0–36.0)
MCV: 89 fL (ref 80.0–100.0)
Monocytes Absolute: 0.5 10*3/uL (ref 0.1–1.0)
Monocytes Relative: 9 %
Neutro Abs: 3.8 10*3/uL (ref 1.7–7.7)
Neutrophils Relative %: 64 %
Platelets: 198 10*3/uL (ref 150–400)
RBC: 4.17 MIL/uL (ref 3.87–5.11)
RDW: 14.1 % (ref 11.5–15.5)
WBC: 6 10*3/uL (ref 4.0–10.5)
nRBC: 0 % (ref 0.0–0.2)

## 2021-02-28 LAB — COMPREHENSIVE METABOLIC PANEL
ALT: 15 U/L (ref 0–44)
AST: 16 U/L (ref 15–41)
Albumin: 3.2 g/dL — ABNORMAL LOW (ref 3.5–5.0)
Alkaline Phosphatase: 149 U/L — ABNORMAL HIGH (ref 38–126)
Anion gap: 8 (ref 5–15)
BUN: 29 mg/dL — ABNORMAL HIGH (ref 8–23)
CO2: 24 mmol/L (ref 22–32)
Calcium: 8.8 mg/dL — ABNORMAL LOW (ref 8.9–10.3)
Chloride: 109 mmol/L (ref 98–111)
Creatinine, Ser: 2.93 mg/dL — ABNORMAL HIGH (ref 0.44–1.00)
GFR, Estimated: 16 mL/min — ABNORMAL LOW (ref >60)
Glucose, Bld: 125 mg/dL — ABNORMAL HIGH (ref 70–99)
Potassium: 3.7 mmol/L (ref 3.5–5.1)
Sodium: 141 mmol/L (ref 135–145)
Total Bilirubin: 0.5 mg/dL (ref 0.3–1.2)
Total Protein: 6.4 g/dL — ABNORMAL LOW (ref 6.5–8.1)

## 2021-02-28 LAB — IRON AND TIBC
Iron: 68 ug/dL (ref 28–170)
Saturation Ratios: 23 % (ref 10.4–31.8)
TIBC: 300 ug/dL (ref 250–450)
UIBC: 232 ug/dL

## 2021-02-28 LAB — FERRITIN: Ferritin: 80 ng/mL (ref 11–307)

## 2021-02-28 LAB — VITAMIN B12: Vitamin B-12: 891 pg/mL (ref 180–914)

## 2021-02-28 LAB — FOLATE: Folate: 10.4 ng/mL (ref >5.9)

## 2021-03-07 NOTE — Progress Notes (Signed)
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  Erin Jordan was previously followed by hematology/oncology clinic oncology clinic for her right renal cell carcinoma, but was lost to follow-up after her visit with Dr. Delton Coombes on 09/20/2019.  She was referred back to [place] by Dr. Theador Hawthorne for evaluation of faint M spike on her most recent labs.  Patient is accompanied today by her son, who assists in history.  Regarding her right renal cell carcinoma, she has continued to follow with Dr. Alyson Ingles, although she was lost to follow-up with our clinic.  She denies any B symptoms such as fever, chills, night sweats, unintentional weight loss.  She has not had any flank pain, hematuria, or changes in her urinary habits.  Recent ultrasound by Dr. Alyson Ingles showed that the right renal tumor has increased in size from 3 cm to 5 cm, and she is in the process of getting another appointment with his office to discuss next steps.  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  She denies any new bone pain or recent fractures.  She denies any B symptoms such as fever, chills, night sweats, unintentional weight loss.  No new neurologic symptoms such as tinnitus, new-onset hearing loss, blurred vision, headache, or dizziness.  Denies any numbness or tingling in hands or feet.  No thromboembolic events since her last visit.  No new masses or lymphadenopathy per her report.  She has 60% energy and 80% appetite. She  endorses that she is maintaining a stable weight.    REVIEW OF SYSTEMS:  Review of Systems  Constitutional:  Positive for fatigue (mild fatigue, energy 60%). Negative for appetite change, chills, diaphoresis, fever and unexpected weight change.  HENT:   Negative for lump/mass and nosebleeds.   Eyes:  Negative for eye problems.  Respiratory:  Negative for cough, hemoptysis and shortness of breath.   Cardiovascular:  Negative for chest pain, leg swelling and palpitations.  Gastrointestinal:  Negative for abdominal pain, blood in stool, constipation, diarrhea, nausea and vomiting.  Genitourinary:  Negative for hematuria.   Skin: Negative.   Neurological:  Negative for dizziness, headaches and light-headedness.  Hematological:  Does not bruise/bleed easily.     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[place], graft disease documented March 2019 with DES to SVG to diagonal   Essential hypertension    Free monoclonal light chain 04/14/2012   History of diabetes mellitus, type II    Hyperlipidemia    Hypothyroidism    NSTEMI (non-ST elevated myocardial infarction) ([place]) 06/24/2017   Pneumonia    hx of 2021    Renal hematoma    Right renal mass    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   Past Surgical History:  Procedure Laterality Date  ABDOMINAL HYSTERECTOMY     CORONARY ARTERY BYPASS GRAFT  2006   [place], [place]   CORONARY STENT INTERVENTION N/A 06/25/2017   Procedure: CORONARY STENT INTERVENTION;  Surgeon: Jettie Booze, MD;  Location: [facility];  Service: Cardiovascular;  Laterality: N/A;   IR RADIOLOGIST EVAL & MGMT  07/08/2019   LEFT HEART CATH AND CORS/GRAFTS ANGIOGRAPHY N/A 06/25/2017   Procedure: LEFT HEART CATH AND CORS/GRAFTS  ANGIOGRAPHY;  Surgeon: Jettie Booze, MD;  Location: [facility];  Service: Cardiovascular;  Laterality: N/A;   THYROIDECTOMY N/A 07/28/2020   Procedure: TOTAL THYROIDECTOMY;  Surgeon: Armandina Gemma, MD;  Location: [facility];  Service: General;  Laterality: N/A;  2 HOURS     SOCIAL HISTORY:  Social History   Socioeconomic History   Marital status: Widowed    Spouse name: Not on file   Number of children: 6   Years of education: Not on file   Highest education level: Not on file  Occupational History   Occupation: retired  Tobacco Use   Smoking status: Never   Smokeless tobacco: Never  Vaping Use   Vaping Use: Never used  Substance and Sexual Activity   Alcohol use: No   Drug use: No   Sexual activity: Never  Other Topics Concern   Not on file  Social History Narrative   Lives in [place] with her daughter and grandchildren.   She has dementia   Social Determinants of Radio broadcast assistant Strain: Medium Risk   Difficulty of Paying Living Expenses: Somewhat hard  Food Insecurity: No Food Insecurity   Worried About Charity fundraiser in the Last Year: Never true   Ran Out of Food in the Last Year: Never true  Transportation Needs: No Transportation Needs   Lack of Transportation (Medical): No   Lack of Transportation (Non-Medical): No  Physical Activity: Insufficiently Active   Days of Exercise per Week: 7 days   Minutes of Exercise per Session: 20 min  Stress: No Stress Concern Present   Feeling of Stress : Only a little  Social Connections: Moderately Isolated   Frequency of Communication with Friends and Family: More than three times a week   Frequency of Social Gatherings with Friends and Family: More than three times a week   Attends Religious Services: 1 to 4 times per year   Active Member of [employer] or Organizations: No   Attends Archivist Meetings: Never   Marital Status: Widowed  Human resources officer Violence: Not At Risk   Fear of  Current or Ex-Partner: No   Emotionally Abused: No   Physically Abused: No   Sexually Abused: No    FAMILY HISTORY:  Family History  Problem Relation Age of Onset   Heart attack Mother    Hypertension Mother    Seizures Son    Seizures Son    Seizures Daughter     CURRENT MEDICATIONS:  Outpatient Encounter Medications as of 03/08/2021  Medication Sig   amLODipine (NORVASC) 10 MG tablet Take 1 tablet (10 mg total) by mouth daily.   aspirin EC 81 MG tablet Take 1 tablet (81 mg total) by mouth daily. Swallow whole.   atorvastatin (LIPITOR) 80 MG tablet TAKE 1 TABLET BY MOUTH ONCE DAILY.   carvedilol (COREG) 12.5 MG tablet Take 1 tablet (12.5 mg total) by mouth 2 (two) times daily. For BP and Heart   donepezil (ARICEPT) 10 MG tablet TAKE (1) TABLET BY MOUTH AT BEDTIME.  ezetimibe (ZETIA) 10 MG tablet Take 1 tablet (10 mg total) by mouth daily.   levothyroxine (SYNTHROID) 100 MCG tablet TAKE 1 TABLET BY MOUTH ONCE BEFORE BREAKFAST.   traMADol (ULTRAM) 50 MG tablet Take 1 tablet (50 mg total) by mouth every 6 (six) hours as needed.   traZODone (DESYREL) 50 MG tablet TAKE (1) TABLET BY MOUTH AT BEDTIME.   No facility-administered encounter medications on file as of 03/08/2021.    ALLERGIES:  No Known Allergies   PHYSICAL EXAM:  ECOG PERFORMANCE STATUS: 0 - Asymptomatic  There were no vitals filed for this visit. There were no vitals filed for this visit. Physical Exam Constitutional:      Appearance: Normal appearance.     Comments: Thin body habitus  HENT:     Head: Normocephalic and atraumatic.     Mouth/Throat:     Mouth: Mucous membranes are moist.  Eyes:     Extraocular Movements: Extraocular movements intact.     Pupils: Pupils are equal, round, and reactive to light.  Cardiovascular:     Rate and Rhythm: Normal rate and regular rhythm.     Pulses: Normal pulses.     Heart sounds: Normal heart sounds.  Pulmonary:     Effort: Pulmonary effort is normal.      Breath sounds: Normal breath sounds.  Abdominal:     General: Bowel sounds are normal.     Palpations: Abdomen is soft.     Tenderness: There is no abdominal tenderness.  Musculoskeletal:        General: No swelling.     Right lower leg: No edema.     Left lower leg: No edema.  Lymphadenopathy:     Cervical: No cervical adenopathy.  Skin:    General: Skin is warm and dry.  Neurological:     General: No focal deficit present.     Mental Status: She is alert and oriented to person, place, and time.  Psychiatric:        Mood and Affect: Mood normal.        Behavior: Behavior normal.     LABORATORY DATA:  I have reviewed the labs as listed.  CBC    Component Value Date/Time   WBC 6.0 02/28/2021 1040   RBC 4.17 02/28/2021 1040   HGB 12.5 02/28/2021 1040   HGB 13.3 10/13/2020 1004   HCT 37.1 02/28/2021 1040   HCT 38.8 10/13/2020 1004   PLT 198 02/28/2021 1040   PLT 228 10/13/2020 1004   MCV 89.0 02/28/2021 1040   MCV 88 10/13/2020 1004   MCH 30.0 02/28/2021 1040   MCHC 33.7 02/28/2021 1040   RDW 14.1 02/28/2021 1040   RDW 12.5 10/13/2020 1004   LYMPHSABS 1.4 02/28/2021 1040   LYMPHSABS 1.3 10/13/2020 1004   MONOABS 0.5 02/28/2021 1040   EOSABS 0.2 02/28/2021 1040   EOSABS 0.1 10/13/2020 1004   BASOSABS 0.0 02/28/2021 1040   BASOSABS 0.0 10/13/2020 1004   CMP Latest Ref Rng & Units 02/28/2021 01/08/2021 12/14/2020  Glucose 70 - 99 mg/dL 125(H) 153(H) 136(H)  BUN 8 - 23 mg/dL 29(H) 50(H) 33(H)  Creatinine 0.44 - 1.00 mg/dL 2.93(H) 2.70(H) 2.26(H)  Sodium 135 - 145 mmol/L 141 142 141  Potassium 3.5 - 5.1 mmol/L 3.7 3.9 3.6  Chloride 98 - 111 mmol/L 109 108 109  CO2 22 - 32 mmol/L '24 25 24  ' Calcium 8.9 - 10.3 mg/dL 8.8(L) 9.4 8.9  Total Protein 6.5 - 8.1 g/dL 6.4(L) 7.3 -  Total Bilirubin 0.3 - 1.2 mg/dL 0.5 0.9 -  Alkaline Phos 38 - 126 U/L 149(H) 138(H) -  AST 15 - 41 U/L 16 20 -  ALT 0 - 44 U/L 15 20 -    DIAGNOSTIC IMAGING:  I have independently reviewed the  relevant imaging and discussed with the patient.  ASSESSMENT & PLAN: 1.  MGUS - Referred by Dr. Theador Hawthorne (02/08/2021) due to findings of faint M spike on most recent lab work - No new bone pain or B symptoms - Most recent labs show normal CBC with Hgb 12.5.  Her creatinine is 2.93, which is about at her usual baseline CKD stage IV (she follows with Dr. Theador Hawthorne) - PLAN: We will initiate full work-up of possible plasma cell dyscrasia (MGUS versus multiple myeloma) as below. - Labs today: SPEP, serum IFE, kappa/lambda light chains, LDH, beta-2 microglobulin, and 24-hour urine with UPEP/IFE.  (CBC and CMP already obtained on 02/28/2021) - Skeletal survey today - RTC in 2 to 3 weeks to discuss results and next steps - We will consider bone marrow biopsy depending on the above results.  2.  Right renal cell carcinoma - CT scan (04/24/2018): Suspicious mass in the right kidney. - Renal US (July 2020): Solid right kidney mass measuring 3.6 cm. - MRI of the abdomen without contrast (12/12/2018): Suspicious for renal cell cancer, although limited by lack of contrast.  MRI also showed indeterminate mass in the spleen, measuring 4 cm. - PET scan (03/03/2019): Showed 3.6 cm soft tissue density, partially exophytic in the right renal cortical area with SUV 3.4.  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  No evidence of hypermetabolic metastatic disease. - Patient referred to urology, followed by Dr. Alyson Ingles, who discussed various options including surgical resection, ablation, and surveillance (05/19/2019) - CT-guided kidney biopsy (07/27/2019) confirmed renal cell carcinoma.  Biopsy was complicated by hematoma, for which patient was hospitalized from 08/01/2020 through 08/05/2020. - Dr. Alyson Ingles referred patient to IR for right renal mass ablation, but per note by Ascencion Dike, PA-C (08/02/2019), mass was "not ideal for curative cryoablation given size and central  location, awaiting input from urology regarding feasibility of elective nephrectomy" - She was lost to follow-up with oncology clinic after June 2021, but has continued to see Dr. Alyson Ingles every 6 months with renal ultrasounds and CT scans for surveillance of renal mass - Most recent visit with Dr. Alyson Ingles was on 01/09/2021, who ordered renal ultrasound and was planning for follow-up visit in 6 months if no major changes.  However, renal ultrasound (02/05/2021) showed an increase in the size of the renal mass from 3 cm to 5 cm. - No B symptoms, flank pain, or hematuria - PLAN: Patient instructed to follow back with Dr. Alyson Ingles regarding increased size of renal mass.  Her son reports that he is call their office and is awaiting a follow-up appointment. - MD visit with Dr. Delton Coombes in 1 month for MD assessment, since patient has not been evaluated by Dr. Delton Coombes since June 2021   PLAN SUMMARY & DISPOSITION: Labs and skeletal survey today 24-hour urine MD visit in 2 to 3 weeks with Dr. Delton Coombes  All questions were answered. The patient knows to call the clinic with any problems, questions or concerns.  Medical decision making: Moderate  Time spent on visit: I spent 20 minutes counseling the patient face to face. The total time spent in the appointment was 30 minutes and more  than 50% was on counseling.   Harriett Rush, PA-C  03/08/2021 11:33 AM

## 2021-03-08 ENCOUNTER — Ambulatory Visit (HOSPITAL_COMMUNITY)
Admission: RE | Admit: 2021-03-08 | Discharge: 2021-03-08 | Disposition: A | Discharge status: Home / Self Care | Payer: Medicare HMO | Source: Ambulatory Visit | Attending: Physician Assistant | Admitting: Physician Assistant

## 2021-03-08 ENCOUNTER — Other Ambulatory Visit: Payer: Self-pay

## 2021-03-08 ENCOUNTER — Inpatient Hospital Stay (HOSPITAL_COMMUNITY): Payer: Medicare HMO | Attending: Physician Assistant | Admitting: Physician Assistant

## 2021-03-08 ENCOUNTER — Inpatient Hospital Stay (HOSPITAL_COMMUNITY): Payer: Medicare HMO

## 2021-03-08 VITALS — BP 172/74 | HR 61 | Temp 98.4°F | Resp 18 | Wt 126.2 lb

## 2021-03-08 DIAGNOSIS — N2889 Other specified disorders of kidney and ureter: Secondary | ICD-10-CM

## 2021-03-08 DIAGNOSIS — D472 Monoclonal gammopathy: Secondary | ICD-10-CM

## 2021-03-08 LAB — LACTATE DEHYDROGENASE: LDH: 277 U/L — ABNORMAL HIGH (ref 98–192)

## 2021-03-08 IMAGING — DX DG BONE SURVEY MET
8 of 10 series · 8 of 10 positions shown · non-contrast
Comparison: CT head without contrast [DATE] CT abdomen and
pelvis [DATE]. Two-view chest x-ray [DATE].

CLINICAL DATA: Monoclonal gammopathy of unknown significance.

EXAM:
METASTATIC BONE SURVEY 21 views

[skull lat]
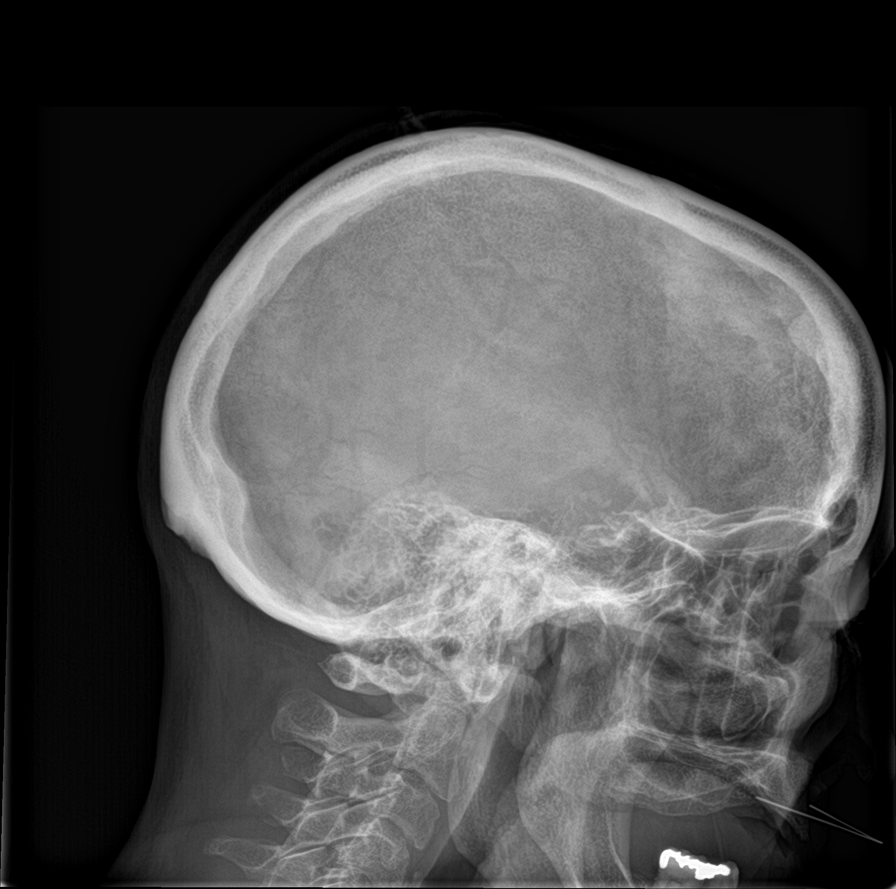

[shoulder ap (1 of 2)]
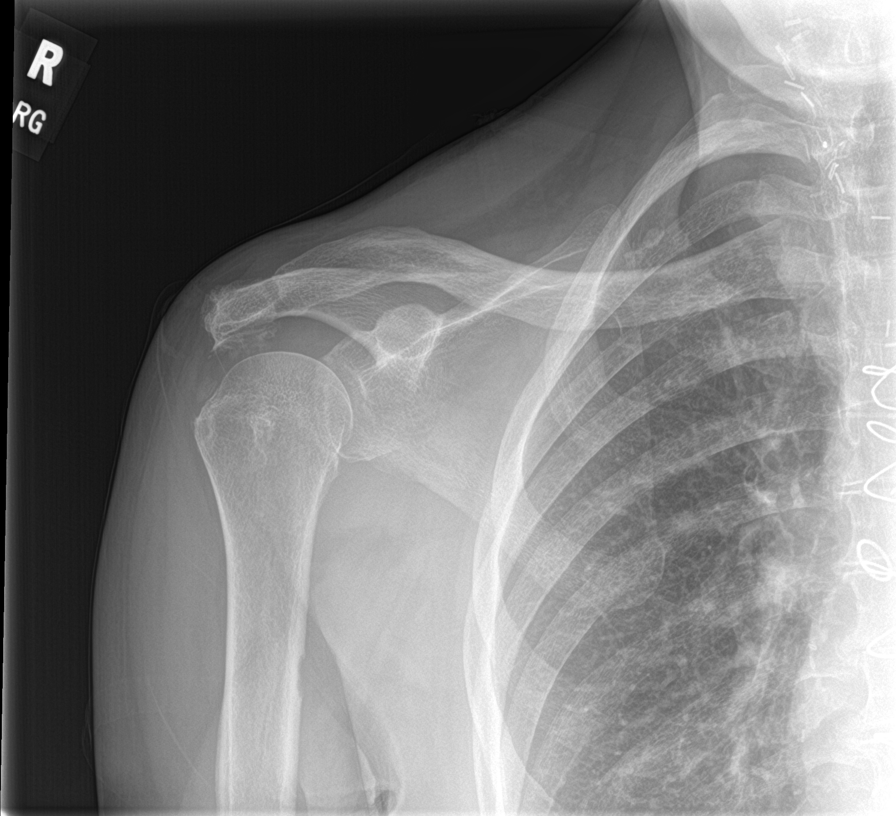

[shoulder ap (2 of 2)]
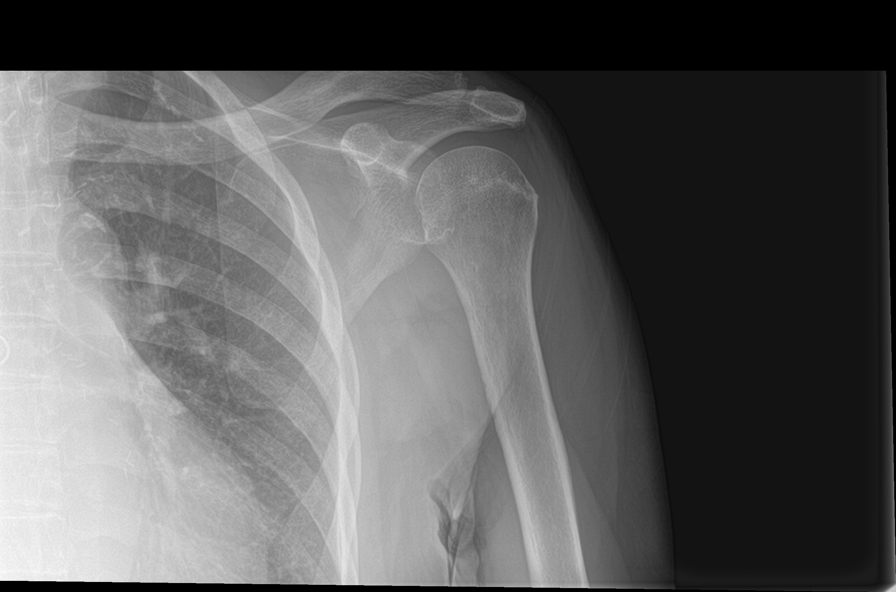

[humerus ap (1 of 2)]
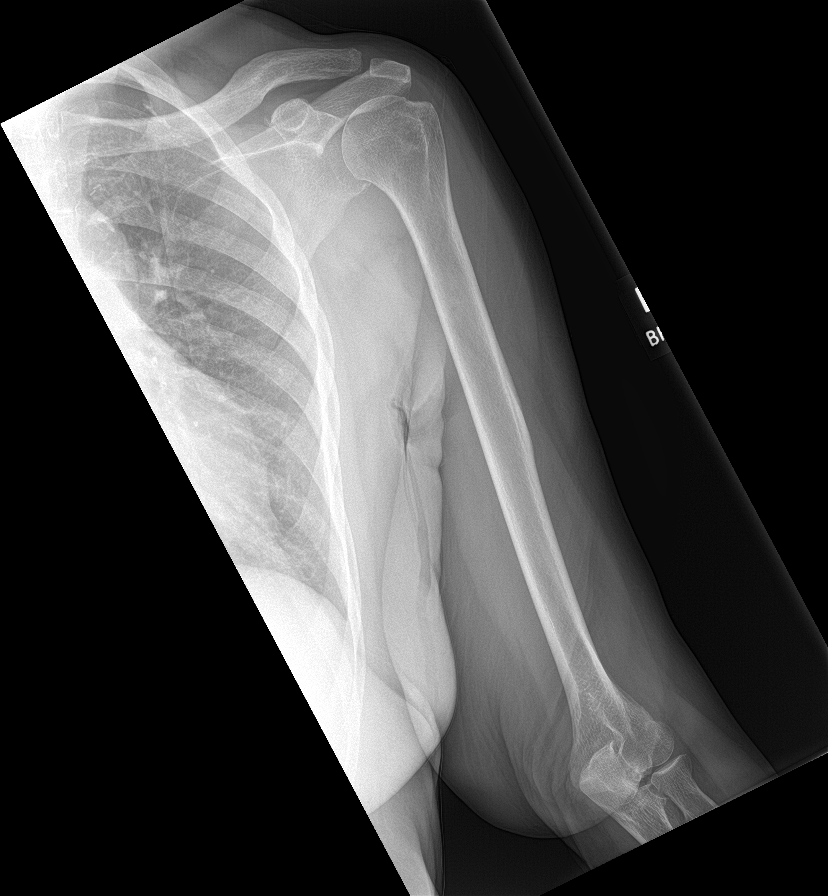

[humerus ap (2 of 2)]
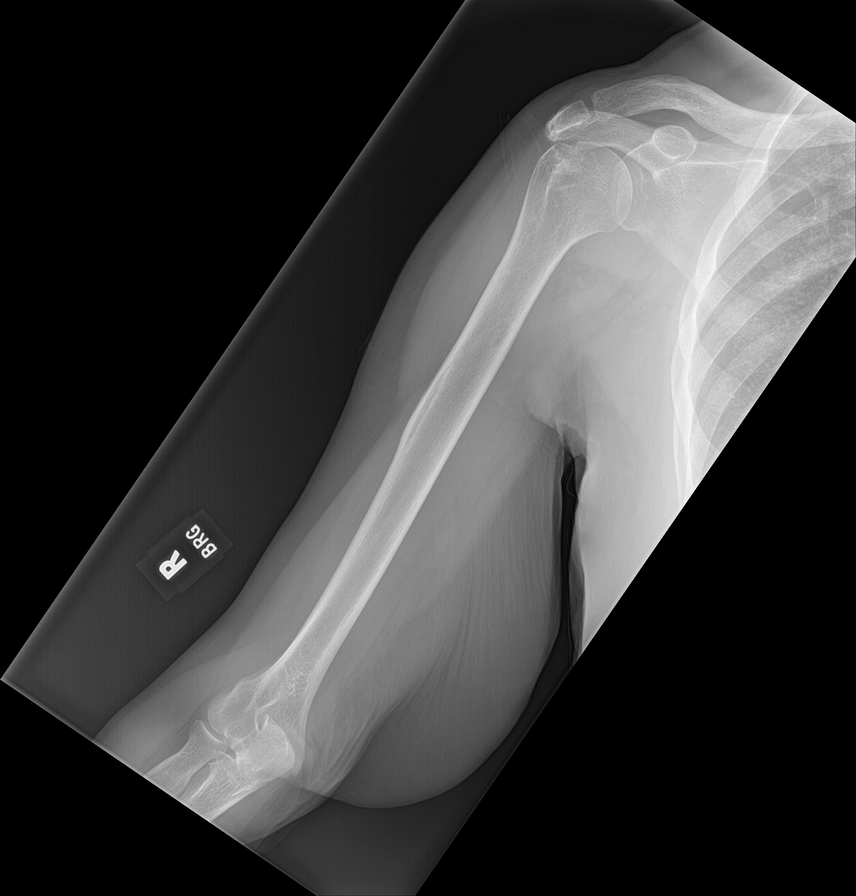

[forearm ap (1 of 2)]
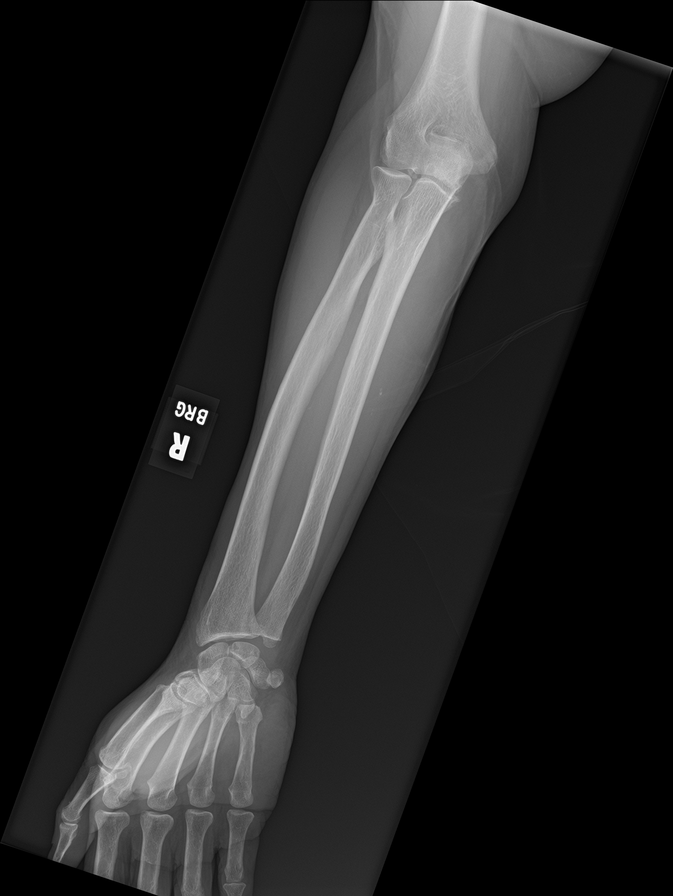

[forearm ap (2 of 2)]
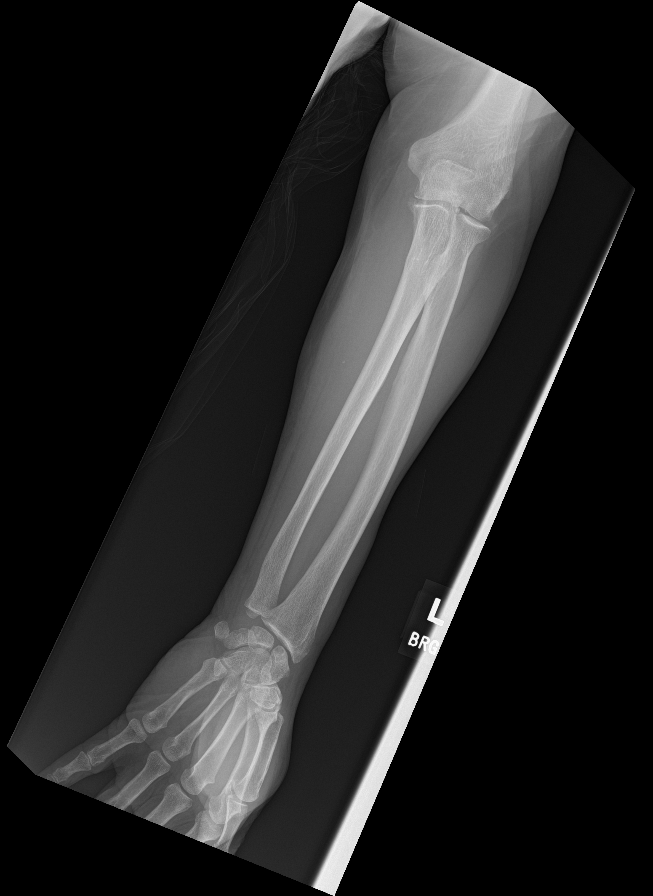

[c-spine ap]
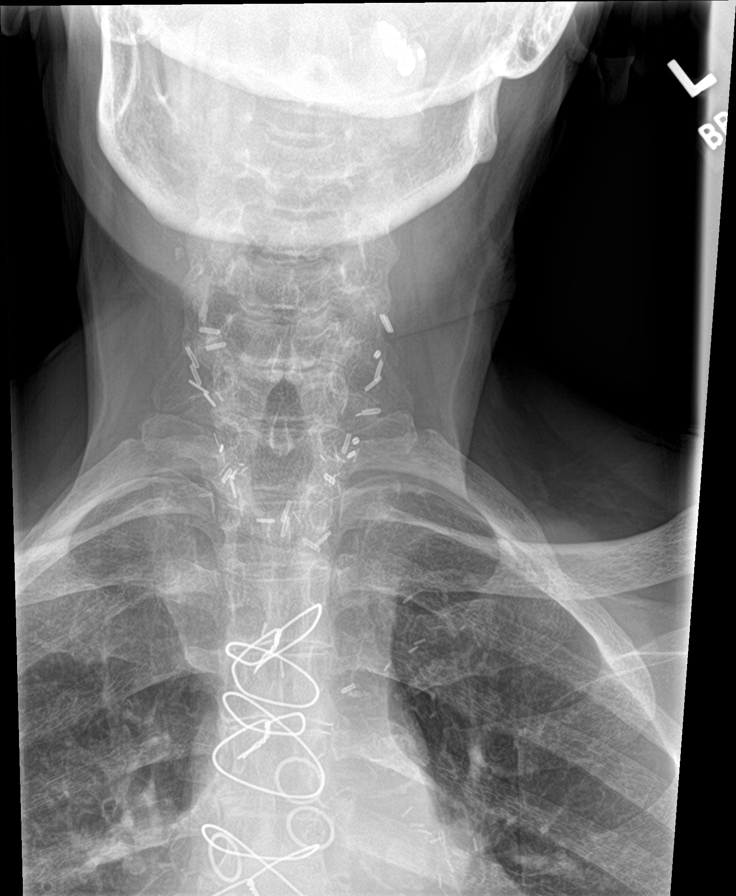

[8 of 10 positions shown; findings below may reference images not displayed]

FINDINGS: No focal lytic or blastic lesions are present. No acute or healing
fractures are present. Degenerative changes in the cervical spine
are most evident at C5-6 and C6-7. Straightening of the cervical
spine noted. Thoracic vertebral body heights maintained.
Degenerative changes are present in the lumbar spine with grade 1
anterolisthesis at L4-5 and facet hypertrophy at L4-5 and L5-S1.

Asymmetric degenerative changes are present in the right shoulder.

Atherosclerotic calcifications are present throughout the aorta and
femoral arteries. No aneurysm is evident.

Cardiomegaly is present without failure, stable. Skull is
unremarkable.
IMPRESSION: 1. No focal lytic or blastic lesions.
2. No acute or healing fractures.
3. Degenerative changes in the cervical and lumbar spine as
described.
4. Asymmetric degenerative changes in the right shoulder.
5. Atherosclerotic changes in the aorta and femoral arteries.

## 2021-03-08 NOTE — Patient Instructions (Addendum)
[facility] at [facility] Discharge Instructions  You were seen today by Tarri Abernethy PA-C for your abnormal protein and your history of kidney cancer.  ABNORMAL PROTEIN: You were sent to [place] by Dr. Theador Hawthorne (kidney doctor) due to an abnormal protein in your blood.  This protein can be associated with chronic kidney disease, but can also be signs of a condition known as MGUS (monoclonal gammopathy of unknown significance), which can in some cases be a precancerous state.  We are going to check additional tests on you today and we will discuss further at your follow-up visit once we have these results.  KIDNEY CANCER: Continue to follow-up with Dr. Alyson Ingles as soon as possible regarding the increased size of your kidney tumor.  You will have an appointment here at our clinic with Dr. Delton Coombes (oncologist/cancer doctor) in 2 to 3 weeks to discuss this with him as well.  LABS: Check labs today before leaving the hospital building  OTHER TESTS: - 24-hour urine test - Check whole-body x-rays in radiology today  MEDICATIONS: No changes to home medications  FOLLOW-UP APPOINTMENT: Office visit with Dr. Delton Coombes in 2 to 3 weeks   Thank you for choosing [facility] at [facility] to provide your oncology and hematology care.  To afford each patient quality time with our provider, please arrive at least 15 minutes before your scheduled appointment time.   If you have a lab appointment with the [facility] please come in thru the Main Entrance and check in at the main information desk.  You need to re-schedule your appointment should you arrive 10 or more minutes late.  We strive to give you quality time with our providers, and arriving late affects you and other patients whose appointments are after yours.  Also, if you no show three or more times for appointments you may be dismissed from the clinic at the providers discretion.     Again, thank  you for choosing [facility].  Our hope is that these requests will decrease the amount of time that you wait before being seen by our physicians.       _____________________________________________________________  Should you have questions after your visit to [facility], please contact our office at [phone] and follow the prompts.  Our office hours are 8:00 a.m. and 4:30 p.m. Monday - Friday.  Please note that voicemails left after 4:00 p.m. may not be returned until the following business day.  We are closed weekends and major holidays.  You do have access to a nurse 24-7, just call the main number to the clinic [phone] and do not press any options, hold on the line and a nurse will answer the phone.    For prescription refill requests, have your pharmacy contact our office and allow 72 hours.    Due to Covid, you will need to wear a mask upon entering the hospital. If you do not have a mask, a mask will be given to you at the Main Entrance upon arrival. For doctor visits, patients may have 1 support person age 9 or older with them. For treatment visits, patients can not have anyone with them due to social distancing guidelines and our immunocompromised population.

## 2021-03-09 LAB — BETA 2 MICROGLOBULIN, SERUM: Beta-2 Microglobulin: 3.9 mg/L — ABNORMAL HIGH (ref 0.6–2.4)

## 2021-03-09 LAB — KAPPA/LAMBDA LIGHT CHAINS
Kappa free light chain: 89.9 mg/L — ABNORMAL HIGH (ref 3.3–19.4)
Kappa, lambda light chain ratio: 2.1 — ABNORMAL HIGH (ref 0.26–1.65)
Lambda free light chains: 42.8 mg/L — ABNORMAL HIGH (ref 5.7–26.3)

## 2021-03-12 LAB — PROTEIN ELECTROPHORESIS, SERUM
A/G Ratio: 0.9 (ref 0.7–1.7)
Albumin ELP: 2.9 g/dL (ref 2.9–4.4)
Alpha-1-Globulin: 0.3 g/dL (ref 0.0–0.4)
Alpha-2-Globulin: 0.9 g/dL (ref 0.4–1.0)
Beta Globulin: 1.3 g/dL (ref 0.7–1.3)
Gamma Globulin: 0.8 g/dL (ref 0.4–1.8)
Globulin, Total: 3.2 g/dL (ref 2.2–3.9)
M-Spike, %: 0.4 g/dL — ABNORMAL HIGH
Total Protein ELP: 6.1 g/dL (ref 6.0–8.5)

## 2021-03-13 LAB — IMMUNOFIXATION ELECTROPHORESIS
IgA: 430 mg/dL — ABNORMAL HIGH (ref 64–422)
IgG (Immunoglobin G), Serum: 752 mg/dL (ref 586–1602)
IgM (Immunoglobulin M), Srm: 34 mg/dL (ref 26–217)
Total Protein ELP: 6.1 g/dL (ref 6.0–8.5)

## 2021-03-22 ENCOUNTER — Other Ambulatory Visit: Payer: Self-pay | Admitting: Family Medicine

## 2021-03-28 NOTE — Progress Notes (Shared)
Erin Jordan, [place] [address]   CLINIC:  Medical Oncology/Hematology  PCP:  [name], [place] [address] / [place] [id number]  [phone]  REASON FOR VISIT:  Follow-up for ***  PRIOR THERAPY: ***  CURRENT THERAPY: ***  INTERVAL HISTORY:  Erin Jordan, a 75 y.o. female, returns for routine follow-up for her ***. Erin Jordan was last seen on {XX/XX/XXXX}.   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/2012   History of diabetes mellitus, type II    Hyperlipidemia    Hypothyroidism    NSTEMI (non-ST elevated myocardial infarction) ([place]) 06/24/2017   Pneumonia    hx of 2021    Renal hematoma    Right renal mass    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   Past Surgical History:  Procedure Laterality Date   ABDOMINAL HYSTERECTOMY     CORONARY ARTERY BYPASS GRAFT  2006   [place], [place] N/A 06/25/2017   Procedure: CORONARY STENT INTERVENTION;  Surgeon: Jettie Booze, MD;  Location: [facility];  Service: Cardiovascular;  Laterality: N/A;   IR RADIOLOGIST EVAL & MGMT  07/08/2019   LEFT HEART CATH AND CORS/GRAFTS ANGIOGRAPHY N/A 06/25/2017   Procedure: LEFT HEART CATH AND CORS/GRAFTS ANGIOGRAPHY;  Surgeon: Jettie Booze, MD;  Location: [facility];  Service: Cardiovascular;  Laterality: N/A;   THYROIDECTOMY N/A 07/28/2020   Procedure: TOTAL THYROIDECTOMY;  Surgeon: Armandina Gemma, MD;  Location: [facility];  Service: General;  Laterality: N/A;  2 HOURS    SOCIAL HISTORY:   Social History   Socioeconomic History   Marital status: Widowed    Spouse name: Not on file   Number of children: 6   Years of education: Not on file   Highest education level: Not on file  Occupational History   Occupation: retired  Tobacco Use   Smoking status: Never   Smokeless tobacco: Never  Vaping Use   Vaping Use: Never used  Substance and Sexual Activity   Alcohol use: No   Drug use: No   Sexual activity: Never  Other Topics Concern   Not on file  Social History Narrative   Lives in [place] with her daughter and grandchildren.   She has dementia   Social Determinants of Radio broadcast assistant Strain: Medium Risk   Difficulty of Paying Living Expenses: Somewhat hard  Food Insecurity: No Food Insecurity   Worried About Charity fundraiser in the Last Year: Never true   Ran Out of Food in the Last Year: Never true  Transportation Needs: No Transportation Needs   Lack of Transportation (Medical): No   Lack of Transportation (Non-Medical): No  Physical Activity: Insufficiently Active   Days of Exercise per Week: 7 days   Minutes of Exercise per Session: 20 min  Stress: No Stress Concern Present   Feeling of Stress : Only a little  Social Connections: Moderately Isolated   Frequency of Communication with Friends and Family: More than three times  a week   Frequency of Social Gatherings with Friends and Family: More than three times a week   Attends Religious Services: 1 to 4 times per year   Active Member of [employer] or Organizations: No   Attends Archivist Meetings: Never   Marital Status: Widowed  Human resources officer Violence: Not At Risk   Fear of Current or Ex-Partner: No   Emotionally Abused: No   Physically Abused: No   Sexually Abused: No    FAMILY HISTORY:  Family History  Problem Relation Age of Onset   Heart attack Mother    Hypertension Mother    Seizures Son    Seizures Son    Seizures Daughter     CURRENT MEDICATIONS:   Current Outpatient Medications  Medication Sig Dispense Refill   amLODipine (NORVASC) 10 MG tablet Take 1 tablet (10 mg total) by mouth daily. 30 tablet 5   aspirin EC 81 MG tablet Take 1 tablet (81 mg total) by mouth daily. Swallow whole. 30 tablet 5   atorvastatin (LIPITOR) 80 MG tablet TAKE 1 TABLET BY MOUTH ONCE DAILY. 30 tablet 4   calcitRIOL (ROCALTROL) 0.25 MCG capsule Take by mouth.     carvedilol (COREG) 12.5 MG tablet Take 1 tablet (12.5 mg total) by mouth 2 (two) times daily. For BP and Heart 180 tablet 1   Cholecalciferol 25 MCG (1000 UT) capsule Take by mouth.     donepezil (ARICEPT) 10 MG tablet TAKE (1) TABLET BY MOUTH AT BEDTIME. 30 tablet 5   ezetimibe (ZETIA) 10 MG tablet Take 1 tablet (10 mg total) by mouth daily. 30 tablet 2   levothyroxine (SYNTHROID) 100 MCG tablet TAKE 1 TABLET BY MOUTH ONCE BEFORE BREAKFAST. 90 tablet 0   traMADol (ULTRAM) 50 MG tablet Take 1 tablet (50 mg total) by mouth every 6 (six) hours as needed. (Patient not taking: Reported on 03/08/2021) 15 tablet 0   traZODone (DESYREL) 50 MG tablet TAKE (1) TABLET BY MOUTH AT BEDTIME. 90 tablet 1   No current facility-administered medications for this visit.    ALLERGIES:  No Known Allergies  PHYSICAL EXAM:  Performance status (ECOG): {CHL ONC ES:[id number]}  There were no vitals filed for this visit. Wt Readings from Last 3 Encounters:  03/08/21 126 lb 3.2 oz (57.2 kg)  01/11/21 121 lb (54.9 kg)  01/09/21 120 lb 3.2 oz (54.5 kg)   Physical Exam  LABORATORY DATA:  I have reviewed the labs as listed.  CBC Latest Ref Rng & Units 02/28/2021 01/08/2021 12/14/2020  WBC 4.0 - 10.5 K/uL 6.0 7.6 6.0  Hemoglobin 12.0 - 15.0 g/dL 12.5 12.8 11.2(L)  Hematocrit 36.0 - 46.0 % 37.1 38.5 33.3(L)  Platelets 150 - 400 K/uL 198 223 188   CMP Latest Ref Rng & Units 02/28/2021 01/08/2021 12/14/2020  Glucose 70 - 99 mg/dL 125(H) 153(H) 136(H)  BUN 8 - 23 mg/dL 29(H) 50(H) 33(H)  Creatinine 0.44 - 1.00 mg/dL 2.93(H)  2.70(H) 2.26(H)  Sodium 135 - 145 mmol/L 141 142 141  Potassium 3.5 - 5.1 mmol/L 3.7 3.9 3.6  Chloride 98 - 111 mmol/L 109 108 109  CO2 22 - 32 mmol/L '24 25 24  ' Calcium 8.9 - 10.3 mg/dL 8.8(L) 9.4 8.9  Total Protein 6.5 - 8.1 g/dL 6.4(L) 7.3 -  Total Bilirubin 0.3 - 1.2 mg/dL 0.5 0.9 -  Alkaline Phos 38 - 126 U/L 149(H) 138(H) -  AST 15 - 41 U/L 16 20 -  ALT 0 - 44 U/L 15  20 -      Component Value Date/Time   RBC 4.17 02/28/2021 1040   MCV 89.0 02/28/2021 1040   MCV 88 10/13/2020 1004   MCH 30.0 02/28/2021 1040   MCHC 33.7 02/28/2021 1040   RDW 14.1 02/28/2021 1040   RDW 12.5 10/13/2020 1004   LYMPHSABS 1.4 02/28/2021 1040   LYMPHSABS 1.3 10/13/2020 1004   MONOABS 0.5 02/28/2021 1040   EOSABS 0.2 02/28/2021 1040   EOSABS 0.1 10/13/2020 1004   BASOSABS 0.0 02/28/2021 1040   BASOSABS 0.0 10/13/2020 1004    DIAGNOSTIC IMAGING:  I have independently reviewed the scans and discussed with the patient. DG Bone Survey Met  Result Date: 03/11/2021 CLINICAL DATA:  Monoclonal gammopathy of unknown significance. EXAM: METASTATIC BONE SURVEY 21 views COMPARISON:  CT head without contrast 01/08/2021 CT abdomen and pelvis 05/18/2020. Two-view chest x-ray 07/19/2020. FINDINGS: No focal lytic or blastic lesions are present. No acute or healing fractures are present. Degenerative changes in the cervical spine are most evident at C5-6 and C6-7. Straightening of the cervical spine noted. Thoracic vertebral body heights maintained. Degenerative changes are present in the lumbar spine with grade 1 anterolisthesis at L4-5 and facet hypertrophy at L4-5 and L5-S1. Asymmetric degenerative changes are present in the right shoulder. Atherosclerotic calcifications are present throughout the aorta and femoral arteries. No aneurysm is evident. Cardiomegaly is present without failure, stable. Skull is unremarkable. IMPRESSION: 1. No focal lytic or blastic lesions. 2. No acute or healing fractures. 3. Degenerative  changes in the cervical and lumbar spine as described. 4. Asymmetric degenerative changes in the right shoulder. 5. Atherosclerotic changes in the aorta and femoral arteries. Electronically Signed   By: San Morelle M.D.   On: 03/11/2021 06:24     ASSESSMENT:  ***   PLAN:  ***  Orders placed this encounter:  No orders of the defined types were placed in this encounter.    Derek Jack, MD [facility] [phone]   I, ***, am acting as a scribe for Dr. Derek Jack.  {Add Barista Statement}

## 2021-03-29 ENCOUNTER — Inpatient Hospital Stay (HOSPITAL_COMMUNITY): Payer: Medicare HMO | Admitting: Hematology

## 2021-04-16 ENCOUNTER — Encounter (HOSPITAL_COMMUNITY): Payer: Self-pay

## 2021-04-16 ENCOUNTER — Emergency Department (HOSPITAL_COMMUNITY)
Admission: EM | Admit: 2021-04-16 | Discharge: 2021-04-17 | Disposition: A | Discharge status: Home / Self Care | Payer: Medicare HMO | Attending: Emergency Medicine | Admitting: Emergency Medicine

## 2021-04-16 DIAGNOSIS — Z6371 Stress on family due to return of family member from military deployment: Secondary | ICD-10-CM | POA: Insufficient documentation

## 2021-04-16 DIAGNOSIS — Z638 Other specified problems related to primary support group: Secondary | ICD-10-CM

## 2021-04-16 DIAGNOSIS — R531 Weakness: Secondary | ICD-10-CM | POA: Insufficient documentation

## 2021-04-16 LAB — CBC WITH DIFFERENTIAL/PLATELET
Abs Immature Granulocytes: 0.04 10*3/uL (ref 0.00–0.07)
Basophils Absolute: 0.1 10*3/uL (ref 0.0–0.1)
Basophils Relative: 1 %
Eosinophils Absolute: 0.3 10*3/uL (ref 0.0–0.5)
Eosinophils Relative: 4 %
HCT: 33.5 % — ABNORMAL LOW (ref 36.0–46.0)
Hemoglobin: 10.9 g/dL — ABNORMAL LOW (ref 12.0–15.0)
Immature Granulocytes: 1 %
Lymphocytes Relative: 19 %
Lymphs Abs: 1.6 10*3/uL (ref 0.7–4.0)
MCH: 29.5 pg (ref 26.0–34.0)
MCHC: 32.5 g/dL (ref 30.0–36.0)
MCV: 90.8 fL (ref 80.0–100.0)
Monocytes Absolute: 0.6 10*3/uL (ref 0.1–1.0)
Monocytes Relative: 8 %
Neutro Abs: 5.5 10*3/uL (ref 1.7–7.7)
Neutrophils Relative %: 67 %
Platelets: 274 10*3/uL (ref 150–400)
RBC: 3.69 MIL/uL — ABNORMAL LOW (ref 3.87–5.11)
RDW: 14.5 % (ref 11.5–15.5)
WBC: 8.2 10*3/uL (ref 4.0–10.5)
nRBC: 0 % (ref 0.0–0.2)

## 2021-04-16 LAB — COMPREHENSIVE METABOLIC PANEL
ALT: 17 U/L (ref 0–44)
AST: 15 U/L (ref 15–41)
Albumin: 3 g/dL — ABNORMAL LOW (ref 3.5–5.0)
Alkaline Phosphatase: 120 U/L (ref 38–126)
Anion gap: 8 (ref 5–15)
BUN: 49 mg/dL — ABNORMAL HIGH (ref 8–23)
CO2: 22 mmol/L (ref 22–32)
Calcium: 8.2 mg/dL — ABNORMAL LOW (ref 8.9–10.3)
Chloride: 110 mmol/L (ref 98–111)
Creatinine, Ser: 3.3 mg/dL — ABNORMAL HIGH (ref 0.44–1.00)
GFR, Estimated: 14 mL/min — ABNORMAL LOW (ref >60)
Glucose, Bld: 168 mg/dL — ABNORMAL HIGH (ref 70–99)
Potassium: 3.4 mmol/L — ABNORMAL LOW (ref 3.5–5.1)
Sodium: 140 mmol/L (ref 135–145)
Total Bilirubin: 0.4 mg/dL (ref 0.3–1.2)
Total Protein: 6.2 g/dL — ABNORMAL LOW (ref 6.5–8.1)

## 2021-04-16 LAB — CBG MONITORING, ED: Glucose-Capillary: 152 mg/dL — ABNORMAL HIGH (ref 70–99)

## 2021-04-16 NOTE — ED Provider Notes (Signed)
[facility] EMERGENCY DEPARTMENT Provider Note   CSN: [id number] Arrival date & time: 04/16/21  1808     History  Chief Complaint  Patient presents with   Weakness    Erin Jordan is a 76 y.o. female.   Weakness Associated symptoms: no abdominal pain, no arthralgias, no chest pain, no cough, no diarrhea, no dizziness, no dysuria, no fever, no headaches, no myalgias, no nausea, no seizures, no shortness of breath and no vomiting   Patient presents from home via EMS without any current physical complaint.  Medical history is notable for previous MI, DM2, CKD, HTN, hypothyroidism, MGUS.  She states that there has been family drama at her home, where multiple family members are staying.  These family members often engage in drug and alcohol use.  They have not been respecting her rules.  This was caused her emotional upset.  Patient was noted to be crying with EMS.  Currently, she denies any physical complaints.  She denies any concerns of her own safety at her home.    Home Medications Prior to Admission medications   Medication Sig Start Date End Date Taking? Authorizing Provider  amLODipine (NORVASC) 10 MG tablet Take 1 tablet (10 mg total) by mouth daily. 10/13/20   [name], FNP  aspirin EC 81 MG tablet Take 1 tablet (81 mg total) by mouth daily. Swallow whole. 10/13/20   [name], FNP  atorvastatin (LIPITOR) 80 MG tablet TAKE 1 TABLET BY MOUTH ONCE DAILY. 02/21/21   [name], FNP  carvedilol (COREG) 12.5 MG tablet Take 1 tablet (12.5 mg total) by mouth 2 (two) times daily. For BP and Heart 01/11/21   [name], FNP  Cholecalciferol 25 MCG (1000 UT) capsule Take by mouth. 02/08/21 02/08/22  [provider]  donepezil (ARICEPT) 10 MG tablet TAKE (1) TABLET BY MOUTH AT BEDTIME. 10/13/20   Hendricks Limes F, FNP  ezetimibe (ZETIA) 10 MG tablet Take 1 tablet (10 mg total) by mouth daily. 01/12/21   [name], FNP  levothyroxine (SYNTHROID) 100 MCG  tablet TAKE 1 TABLET BY MOUTH ONCE BEFORE BREAKFAST. 02/21/21   Cassandria Anger, MD  traMADol (ULTRAM) 50 MG tablet Take 1 tablet (50 mg total) by mouth every 6 (six) hours as needed. Patient not taking: Reported on 03/08/2021 12/14/20   Veryl Speak, MD  traZODone (DESYREL) 50 MG tablet TAKE (1) TABLET BY MOUTH AT BEDTIME. 03/22/21   [name], FNP      Allergies    Patient has no known allergies.    Review of Systems   Review of Systems  Constitutional:  Negative for activity change, appetite change, chills, fatigue and fever.  HENT:  Negative for congestion, ear pain and sore throat.   Eyes:  Negative for pain and visual disturbance.  Respiratory:  Negative for cough, chest tightness, shortness of breath and wheezing.   Cardiovascular:  Negative for chest pain and palpitations.  Gastrointestinal:  Negative for abdominal pain, diarrhea, nausea and vomiting.  Genitourinary:  Negative for dysuria, flank pain, hematuria and pelvic pain.  Musculoskeletal:  Negative for arthralgias, back pain, gait problem, myalgias and neck pain.  Skin:  Negative for color change and rash.  Neurological:  Negative for dizziness, seizures, syncope, weakness, numbness and headaches.  Hematological:  Does not bruise/bleed easily.  All other systems reviewed and are negative.  Physical Exam Updated Vital Signs BP (!) 155/78 (BP Location: Right Arm)    Pulse 78  Temp 98.2 F (36.8 C) (Oral)    Resp 16    Ht 5\' 6"  (1.676 m)    Wt 65.8 kg    SpO2 100%    BMI 23.40 kg/m  Physical Exam Vitals and nursing note reviewed.  Constitutional:      General: She is not in acute distress.    Appearance: Normal appearance. She is well-developed and normal weight. She is not ill-appearing, toxic-appearing or diaphoretic.  HENT:     Head: Normocephalic and atraumatic.     Right Ear: External ear normal.     Left Ear: External ear normal.     Nose: Nose normal.     Mouth/Throat:     Mouth: Mucous  membranes are moist.     Pharynx: Oropharynx is clear.  Eyes:     General: No scleral icterus.    Extraocular Movements: Extraocular movements intact.     Conjunctiva/sclera: Conjunctivae normal.  Cardiovascular:     Rate and Rhythm: Normal rate and regular rhythm.     Heart sounds: No murmur heard. Pulmonary:     Effort: Pulmonary effort is normal. No respiratory distress.     Breath sounds: Normal breath sounds. No wheezing or rales.  Chest:     Chest wall: No tenderness.  Abdominal:     Palpations: Abdomen is soft.     Tenderness: There is no abdominal tenderness.  Musculoskeletal:        General: No swelling, tenderness or deformity. Normal range of motion.     Cervical back: Normal range of motion and neck supple. No rigidity.     Right lower leg: No edema.     Left lower leg: No edema.  Skin:    General: Skin is warm and dry.     Capillary Refill: Capillary refill takes less than 2 seconds.     Coloration: Skin is not jaundiced or pale.  Neurological:     General: No focal deficit present.     Mental Status: She is alert and oriented to person, place, and time.     Cranial Nerves: No cranial nerve deficit.     Sensory: No sensory deficit.     Motor: No weakness.     Coordination: Coordination normal.  Psychiatric:        Mood and Affect: Mood and affect normal.        Speech: Speech normal. Speech is not rapid and pressured, delayed or tangential.        Behavior: Behavior normal. Behavior is not agitated, slowed or withdrawn. Behavior is cooperative.        Thought Content: Thought content normal.        Judgment: Judgment normal.    ED Results / Procedures / Treatments   Labs (all labs ordered are listed, but only abnormal results are displayed) Labs Reviewed  CBC WITH DIFFERENTIAL/PLATELET - Abnormal; Notable for the following components:      Result Value   RBC 3.69 (*)    Hemoglobin 10.9 (*)    HCT 33.5 (*)    All other components within normal limits   COMPREHENSIVE METABOLIC PANEL - Abnormal; Notable for the following components:   Potassium 3.4 (*)    Glucose, Bld 168 (*)    BUN 49 (*)    Creatinine, Ser 3.30 (*)    Calcium 8.2 (*)    Total Protein 6.2 (*)    Albumin 3.0 (*)    GFR, Estimated 14 (*)    All other components  within normal limits  CBG MONITORING, ED - Abnormal; Notable for the following components:   Glucose-Capillary 152 (*)    All other components within normal limits    EKG EKG Interpretation  Date/Time:  Monday April 16 2021 18:42:55 EST Ventricular Rate:  88 PR Interval:  179 QRS Duration: 107 QT Interval:  380 QTC Calculation: 460 R Axis:   -8 Text Interpretation: Sinus rhythm Borderline T wave abnormalities Confirmed by Godfrey Pick [phone]) on 04/16/2021 8:11:13 PM  Radiology No results found.  Procedures Procedures    Medications Ordered in ED Medications - No data to display  ED Course/ Medical Decision Making/ A&P                           Medical Decision Making  Patient presenting from home by EMS.  EMS was uncertain of why they were called.  Patient endorses "family drama".  She denies any physical complaints upon arrival.  I spoke multiple family members: Her son [name], her granddaughter Larence Penning, and her great-grandson.  Patient's son and Isidor Holts were unaware of why the patient was in the emergency department.  Patient's daughter, Larence Penning, stated that patient was complaining of postprandial abdominal pain as well as pain in her legs.  Co morbidities that complicate the patient evaluation  Advanced age, previous MI, DM2, CKD, HTN, hypothyroidism, MGUS   Additional history obtained:  Additional history obtained from multiple family members External records from outside source obtained and reviewed including EMR: Patient is followed by oncology for diagnosis of MGUS as well as nephrology for her CKD.   Lab Tests:  I Ordered, and personally interpreted labs.  The pertinent  results include: 1.6 g/dL drop in hemoglobin in the past month; baseline creatinine, baseline BUN, chronic hypoproteinemia, absence of leukocytosis.   Imaging Studies ordered:  I ordered imaging studies including none I independently visualized and interpreted imaging which showed N/A   Cardiac Monitoring:  The patient was maintained on a cardiac monitor.  I personally viewed and interpreted the cardiac monitored which showed an underlying rhythm of: Sinus rhythm   Medicines ordered and prescription drug management:  Patient remained asymptomatic while in the ED.  No medications indicated.  Consultations Obtained:  I requested consultation with the [facility] for wellness check at home following discharge.   Problem List / ED Course:  Patient presents to the ED with no specific complaint.  EMS was called to her home following situation with family members.  Patient complains of family members who are currently living with her causing disturbances.  She is unhappy with her current living situation, given the issues she has had with these family members.  She currently denies any physical complaints.  There was a report that she was short of breath on scene.  Basic lab work to be obtained.  Patient is kept on bedside cardiac monitor.  Corroborating information was obtained from multiple family members.  Patient's son and Isidor Holts were unaware of why patient came to the ED.  Calls to patient's daughter, Terri Piedra, went unanswered.  I did speak with her granddaughter, Larence Penning who was at home at time of EMS arrival.  Larence Penning stated that patient was complaining of postprandial abdominal pain and pain in her legs.  When questioned about this, patient states that she was having pain in both of her legs, below the knees.  On exam, these areas are not swollen and not tender.  She currently denies any discomfort to these  areas.  She denies any trauma.  Physical exam findings are not consistent with DVT.  No  imaging is indicated at this time.  Patient's abdomen is also soft and nontender.  She was given food and drink in the ED and was able to tolerate this without any recurrence of pain.  Given the conflicting stories between the patient and family members, I did request a TOC consult for wellness check following discharge.  Patient denies any concerns of her own safety at home.  She does feel comfortable with discharge from the ED.   Reevaluation:  After the interventions noted above, I reevaluated the patient and found that they have :stayed the same   Social Determinants of Health:  Patient has concerns about the behavior of multiple family members at home.  While she denies any concerns of her own safety, she does express increased stress from the behaviors of these family members.  She states that she would like them out of her home.   Dispostion:  After consideration of the diagnostic results and the patients response to treatment, I feel that the patent would benefit from discharge with social work follow-up.          Final Clinical Impression(s) / ED Diagnoses Final diagnoses:  Family conflict    Rx / DC Orders ED Discharge Orders     None         Godfrey Pick, MD 04/17/21 1238

## 2021-04-16 NOTE — ED Notes (Signed)
Assisted pt to bedside toilet.

## 2021-04-16 NOTE — ED Notes (Signed)
Grandaughter called regarding pt disposition and to see if we can send pt home via EMS. Granddaughter was notified that this is not possible since pt has no medical necessity to be transported via EMS. Granddaughter is going to call [name], pt son, back to discuss transportation for pt.

## 2021-04-16 NOTE — ED Notes (Signed)
Pt is able to ambulate without any assistance. Pt walking around room and into doorway.

## 2021-04-16 NOTE — ED Notes (Signed)
Pt got out of bed and went to the bathroom unassisted. Pt took off all medical devices. Pt was returned to room by this RN and hooked back up to the monitor.

## 2021-04-16 NOTE — ED Notes (Signed)
Pt. Was giving a frozen dinner. Meat loaf and mashed potatoes. Pt. ate the whole tray. Washing down with soda

## 2021-04-16 NOTE — ED Notes (Addendum)
Pt states that her stomach/belly feels fine after eating

## 2021-04-16 NOTE — ED Notes (Signed)
Spoke with [name], the son, to pick up pt

## 2021-04-16 NOTE — ED Notes (Signed)
Pt is agitated stating that "it is wrong that my grand-daughter was arrested and I was sent here" "it was wrong that they sent me to jail Sunday just because the children's were acting crazy"  This RN talked to pt and attempted to redirect their attention to another topic and to calm down. Will continue to monitor pt

## 2021-04-16 NOTE — ED Triage Notes (Addendum)
Patient to ED from home with no general complaint. States that she was upset/crying PTA and was [facility] from crying. Keeps referencing family drama when asked about what occurred and how upset she was due to their drinking, etc.

## 2021-04-16 NOTE — ED Notes (Signed)
Ambulated pt. Spo2 100%, Heart rate 92 vs the whole time while walking

## 2021-04-23 ENCOUNTER — Other Ambulatory Visit: Payer: Self-pay | Admitting: Physician Assistant

## 2021-04-24 ENCOUNTER — Other Ambulatory Visit: Payer: Self-pay | Admitting: Family Medicine

## 2021-04-24 DIAGNOSIS — I10 Essential (primary) hypertension: Secondary | ICD-10-CM

## 2021-04-25 ENCOUNTER — Other Ambulatory Visit: Payer: Self-pay | Admitting: Family Medicine

## 2021-05-22 ENCOUNTER — Other Ambulatory Visit: Payer: Self-pay | Admitting: "Endocrinology

## 2021-05-22 ENCOUNTER — Other Ambulatory Visit: Payer: Self-pay | Admitting: Family Medicine

## 2021-05-22 DIAGNOSIS — F0390 Unspecified dementia without behavioral disturbance: Secondary | ICD-10-CM

## 2021-05-25 ENCOUNTER — Other Ambulatory Visit: Payer: Self-pay | Admitting: Family Medicine

## 2021-05-25 DIAGNOSIS — F0390 Unspecified dementia without behavioral disturbance: Secondary | ICD-10-CM

## 2021-05-28 ENCOUNTER — Other Ambulatory Visit: Payer: Self-pay | Admitting: Nephrology

## 2021-05-28 ENCOUNTER — Other Ambulatory Visit (HOSPITAL_COMMUNITY): Payer: Self-pay | Admitting: Nephrology

## 2021-05-28 DIAGNOSIS — N2889 Other specified disorders of kidney and ureter: Secondary | ICD-10-CM

## 2021-05-30 ENCOUNTER — Other Ambulatory Visit: Payer: Self-pay

## 2021-05-30 ENCOUNTER — Ambulatory Visit (HOSPITAL_COMMUNITY)
Admission: RE | Admit: 2021-05-30 | Discharge: 2021-05-30 | Disposition: A | Discharge status: Home / Self Care | Payer: Medicare HMO | Source: Ambulatory Visit | Attending: Nephrology | Admitting: Nephrology

## 2021-05-30 DIAGNOSIS — E890 Postprocedural hypothyroidism: Secondary | ICD-10-CM

## 2021-05-30 DIAGNOSIS — N2889 Other specified disorders of kidney and ureter: Secondary | ICD-10-CM | POA: Diagnosis present

## 2021-05-30 IMAGING — US US RENAL
1 series · 14 of 25 positions shown · non-contrast
Comparison: [DATE] renal ultrasound, CT abdomen and pelvis
[DATE]

CLINICAL DATA: Renal mass

EXAM:
RENAL / URINARY TRACT ULTRASOUND COMPLETE

[Series 1: us renal · 14 of 61 slices shown]
[im 1/61]
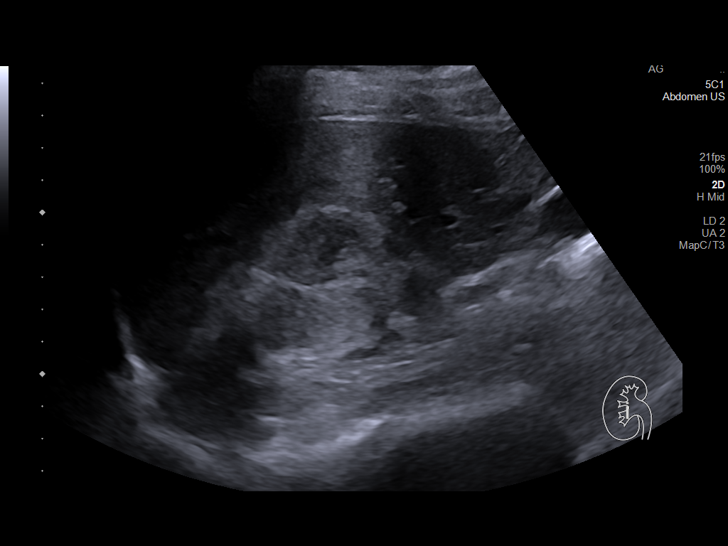
[im 6/61]
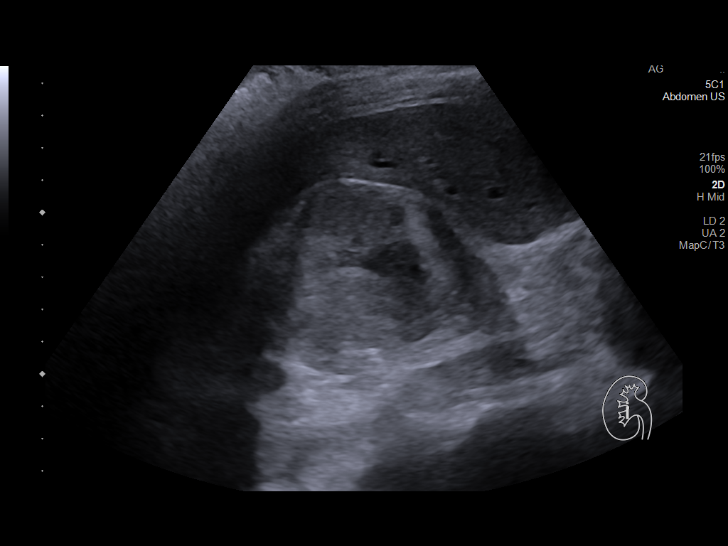
[im 11/61]
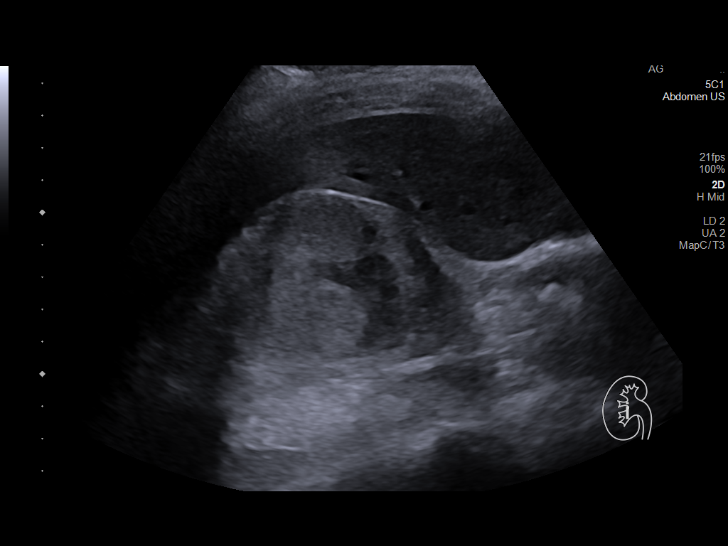
[im 16/61]
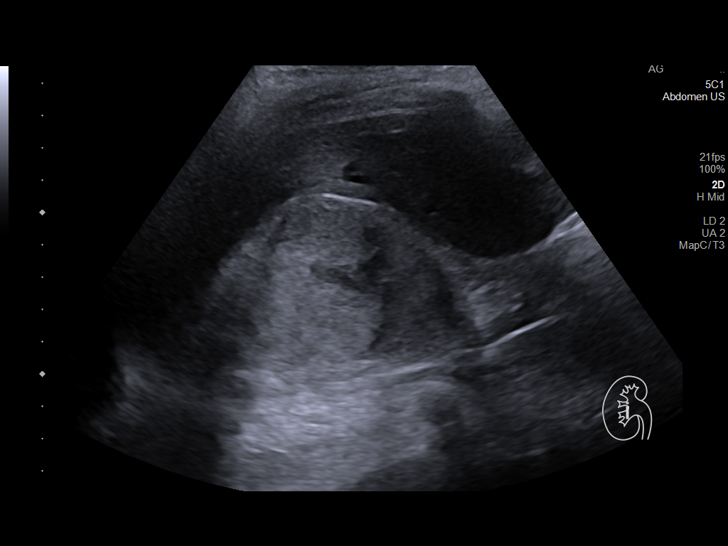
[im 21/61]
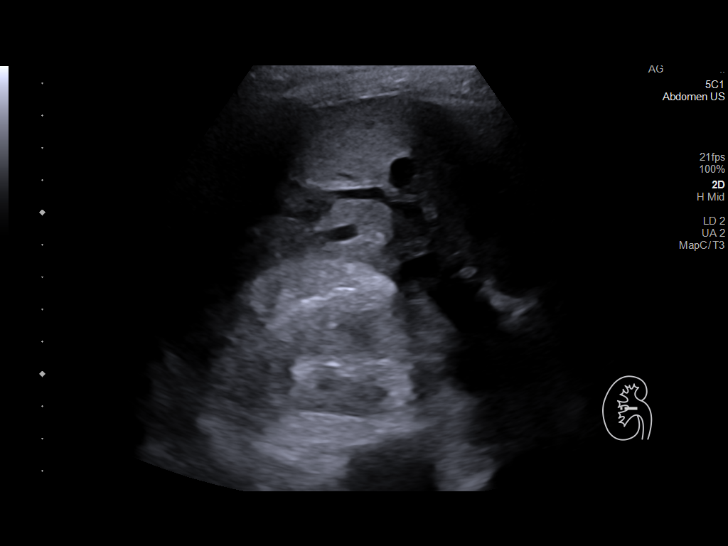
[im 23/61]
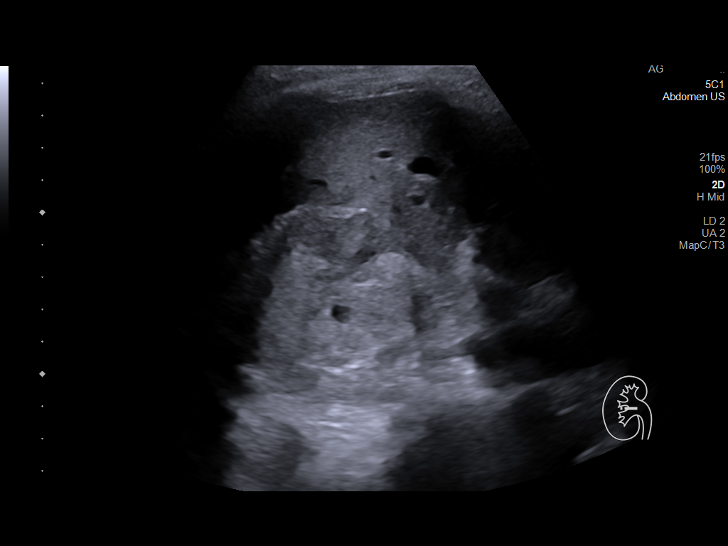
[im 28/61]
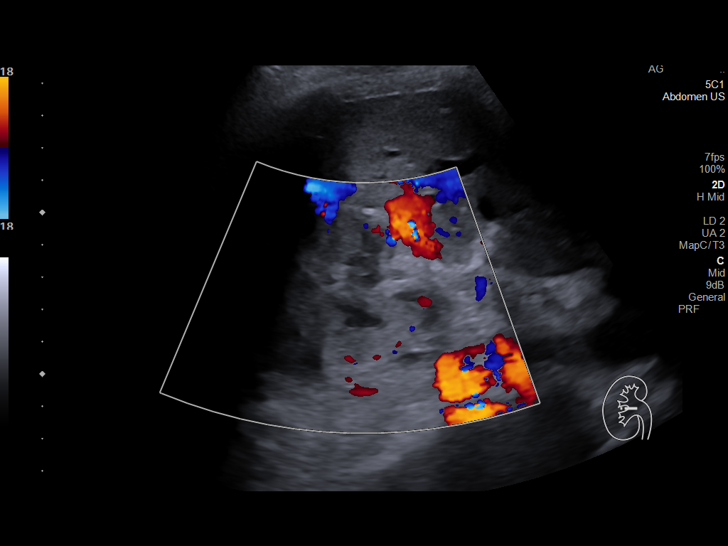
[im 33/61]
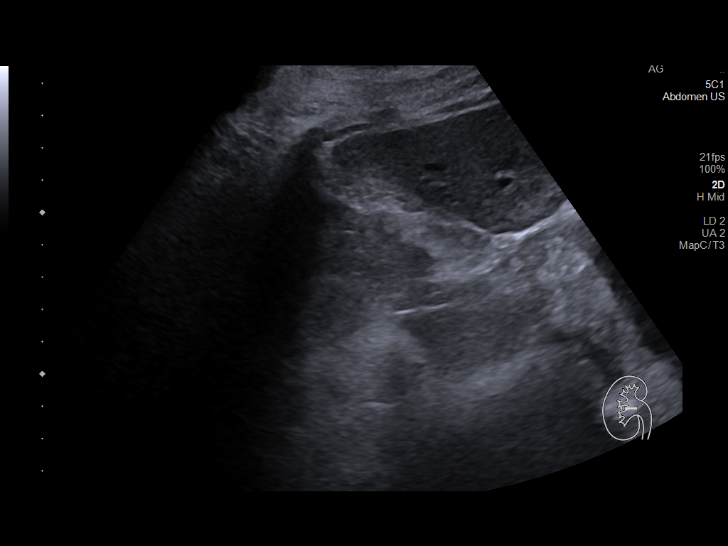
[im 38/61]
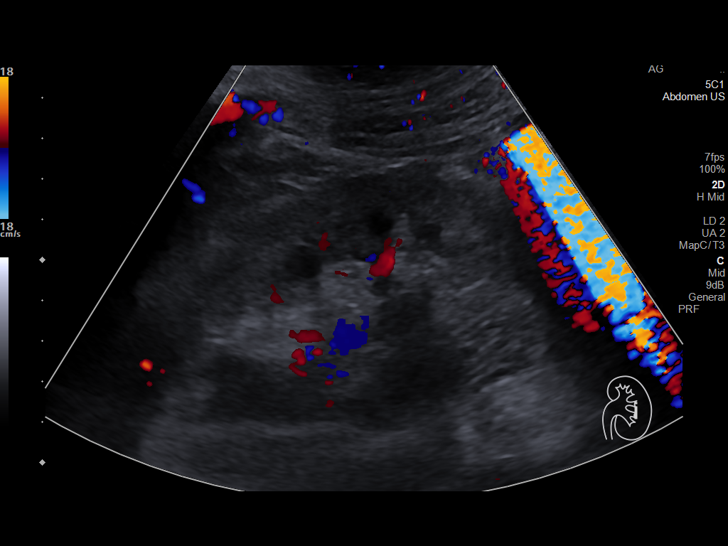
[im 41/61]
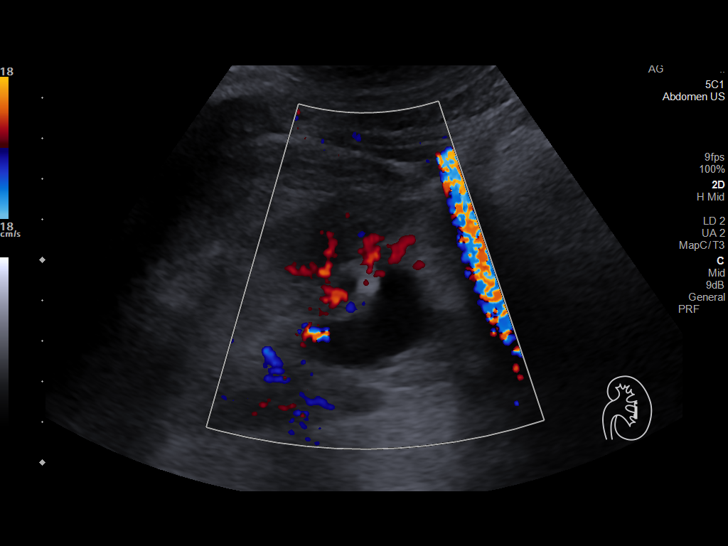
[im 46/61]
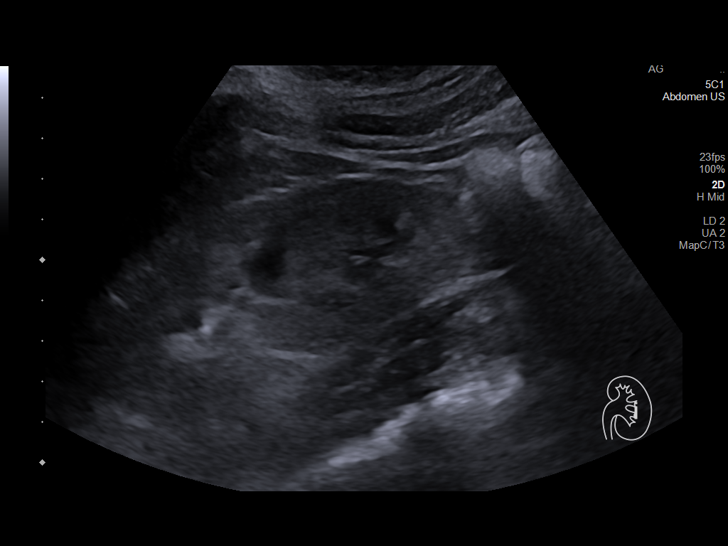
[im 51/61]
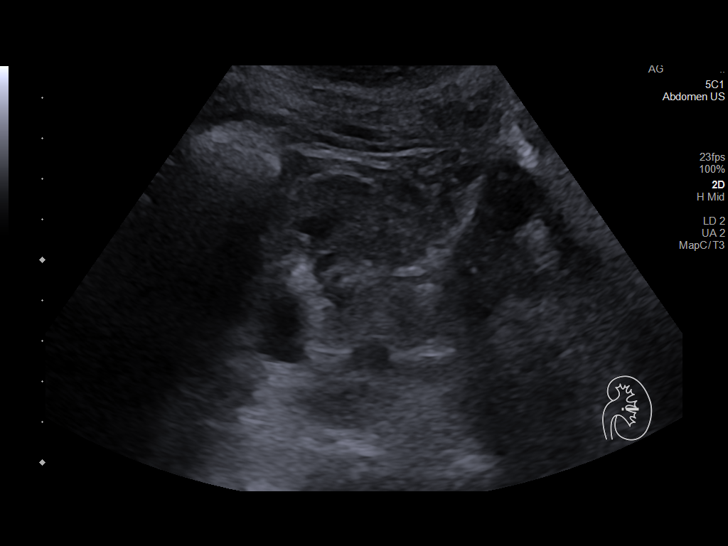
[im 56/61]
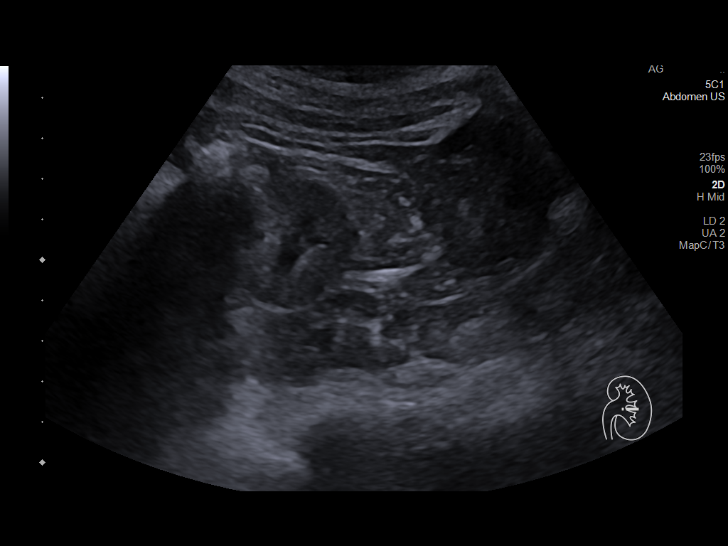
[im 61/61]
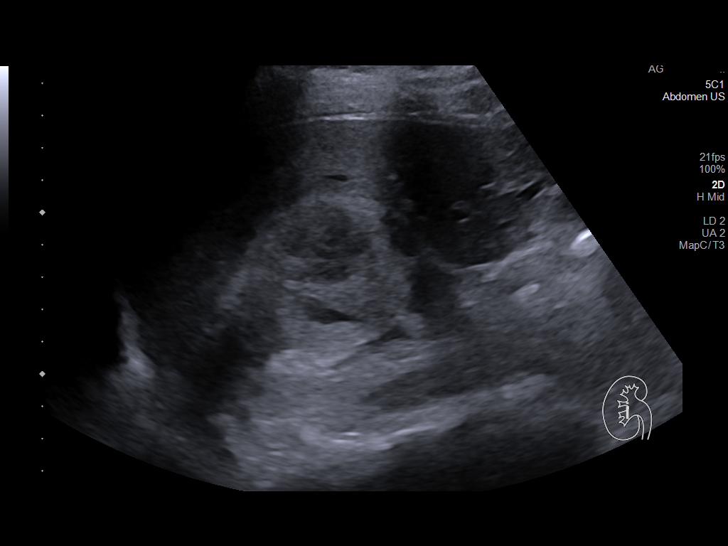

[14 of 25 positions shown; findings below may reference images not displayed]

FINDINGS: Right Kidney:

Renal measurements: 8.2 x 6.1 x 6.3 cm = volume: 166 mL.
Heterogeneous hypoechoic to mildly hyperechoic mass identified at
mid to upper RIGHT kidney, 6.0 x 4.8 x 5.0 cm, highly suspicious for
renal neoplasm; this measured 5.6 x 5.3 x 5.6 cm on the previous
exam. No hydronephrosis. Remaining cortex of the RIGHT kidney
appears mildly thinned and echogenic.

Left Kidney:

Renal measurements: 9.6 x 4.2 x 4.9 cm = volume: 101 mL. Normal
cortical thickness. Increased cortical echogenicity. Mildly dilated
LEFT renal pelvis without calyceal dilatation. No definite shadowing
calculi or renal mass.

Bladder:

Appears normal for degree of bladder distention.

Other:

N/A
IMPRESSION: Medical renal disease changes in both kidneys.

Heterogeneous mid RIGHT renal mass 6.0 x 4.8 x 5.0 cm, little
changed from previous study, highly suspicious for a renal neoplasm,
replacing and obscuring a significant portion of the RIGHT kidney.

## 2021-05-31 LAB — TSH: TSH: 27.5 u[IU]/mL — ABNORMAL HIGH (ref 0.450–4.500)

## 2021-05-31 LAB — T4, FREE: Free T4: 1.33 ng/dL (ref 0.82–1.77)

## 2021-06-01 ENCOUNTER — Ambulatory Visit: Payer: Medicare HMO | Admitting: "Endocrinology

## 2021-06-01 ENCOUNTER — Encounter: Payer: Self-pay | Admitting: "Endocrinology

## 2021-06-01 ENCOUNTER — Other Ambulatory Visit: Payer: Self-pay

## 2021-06-01 VITALS — BP 158/82 | HR 68 | Ht 66.0 in | Wt 129.2 lb

## 2021-06-01 DIAGNOSIS — E890 Postprocedural hypothyroidism: Secondary | ICD-10-CM | POA: Diagnosis not present

## 2021-06-01 DIAGNOSIS — I10 Essential (primary) hypertension: Secondary | ICD-10-CM | POA: Diagnosis not present

## 2021-06-01 MED ORDER — LEVOTHYROXINE SODIUM 100 MCG PO TABS
100.0000 ug | ORAL_TABLET | Freq: Every day | ORAL | 1 refills | Status: DC
Start: 2021-06-01 — End: 2021-12-13

## 2021-06-01 NOTE — Progress Notes (Signed)
06/01/2021, 1:35 PM 06/01/2021   Subjective:    Patient ID: Erin Jordan, female    DOB: Nov 11, 1945, PCP [name], FNP   Past Medical History:  Diagnosis Date   Arthritis    Cancer of kidney [facility])    CKD (chronic kidney disease), stage IV ([place])    Coronary atherosclerosis of native coronary artery 2006   Multivessel status post CABG in [place], graft disease documented March 2019 with DES to SVG to diagonal   Essential hypertension    Free monoclonal light chain 04/14/2012   History of diabetes mellitus, type II    Hyperlipidemia    Hypothyroidism    NSTEMI (non-ST elevated myocardial infarction) ([place]) 06/24/2017   Pneumonia    hx of 2021    Renal hematoma    Right renal mass    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   Past Surgical History:  Procedure Laterality Date   ABDOMINAL HYSTERECTOMY     CORONARY ARTERY BYPASS GRAFT  2006   [place], [place] N/A 06/25/2017   Procedure: CORONARY STENT INTERVENTION;  Surgeon: Jettie Booze, MD;  Location: [place] [facility];  Service: Cardiovascular;  Laterality: N/A;   IR RADIOLOGIST EVAL & MGMT  07/08/2019   LEFT HEART CATH AND CORS/GRAFTS ANGIOGRAPHY N/A 06/25/2017   Procedure: LEFT HEART CATH AND CORS/GRAFTS ANGIOGRAPHY;  Surgeon: Jettie Booze, MD;  Location: [place] [facility];  Service: Cardiovascular;  Laterality: N/A;   THYROIDECTOMY N/A 07/28/2020   Procedure: TOTAL THYROIDECTOMY;  Surgeon: Armandina Gemma, MD;  Location: [facility];  Service: General;  Laterality: N/A;  2 HOURS   Social History   Socioeconomic History   Marital status: Widowed    Spouse name: Not on file   Number of children: 6   Years of education: Not on file   Highest education level: Not on file  Occupational History   Occupation: retired  Tobacco Use   Smoking status: Never   Smokeless tobacco: Never  Vaping Use   Vaping Use: Never used   Substance and Sexual Activity   Alcohol use: No   Drug use: No   Sexual activity: Never  Other Topics Concern   Not on file  Social History Narrative   Lives in [place] with her daughter and grandchildren.   She has dementia   Social Determinants of Radio broadcast assistant Strain: Medium Risk   Difficulty of Paying Living Expenses: Somewhat hard  Food Insecurity: No Food Insecurity   Worried About Charity fundraiser in the Last Year: Never true   Ran Out of Food in the Last Year: Never true  Transportation Needs: No Transportation Needs   Lack of Transportation (Medical): No   Lack of Transportation (Non-Medical): No  Physical Activity: Insufficiently Active   Days of Exercise per Week: 7 days   Minutes of Exercise per Session: 20 min  Stress: No Stress Concern Present   Feeling of Stress : Only a little  Social Connections: Moderately Isolated   Frequency of Communication with Friends and Family: More than three times a week   Frequency of Social Gatherings with Friends and Family: More than three times a week  Attends Religious Services: 1 to 4 times per year   Active Member of Clubs or Organizations: No   Attends Archivist Meetings: Never   Marital Status: Widowed   Outpatient Encounter Medications as of 06/01/2021  Medication Sig   amLODipine (NORVASC) 10 MG tablet TAKE ONE TABLET BY MOUTH ONCE DAILY.   aspirin EC 81 MG tablet Take 1 tablet (81 mg total) by mouth daily. Swallow whole.   atorvastatin (LIPITOR) 80 MG tablet TAKE 1 TABLET BY MOUTH ONCE DAILY.   carvedilol (COREG) 12.5 MG tablet Take 1 tablet (12.5 mg total) by mouth 2 (two) times daily. For BP and Heart   Cholecalciferol 25 MCG (1000 UT) capsule Take by mouth.   donepezil (ARICEPT) 10 MG tablet TAKE (1) TABLET BY MOUTH AT BEDTIME.   ezetimibe (ZETIA) 10 MG tablet Take 1 tablet (10 mg total) by mouth daily.   levothyroxine (SYNTHROID) 100 MCG tablet Take 1 tablet (100 mcg total) by mouth  daily before breakfast.   traMADol (ULTRAM) 50 MG tablet Take 1 tablet (50 mg total) by mouth every 6 (six) hours as needed. (Patient not taking: Reported on 03/08/2021)   traZODone (DESYREL) 50 MG tablet TAKE (1) TABLET BY MOUTH AT BEDTIME.   [DISCONTINUED] levothyroxine (SYNTHROID) 100 MCG tablet TAKE 1 TABLET BY MOUTH ONCE BEFORE BREAKFAST.   No facility-administered encounter medications on file as of 06/01/2021.   ALLERGIES: No Known Allergies  VACCINATION STATUS: Immunization History  Administered Date(s) Administered   Fluad Quad(high Dose 65+) 01/25/2019, 01/11/2021   Influenza Split 06/03/2011   Influenza, High Dose Seasonal PF 06/27/2017   Pneumococcal Conjugate-13 03/26/2013   Pneumococcal Polysaccharide-23 11/03/2007, 02/14/2015, 06/27/2017    HPI Erin Jordan is 76 y.o. female who presents today with a medical history as above. 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  She is status post total thyroidectomy for compressive known multinodular goiter.   She was referred by  [name], FNP.  -She is supposed to be on levothyroxine 100 mcg p.o. daily.  She reports some inconsistency taking this medication.  Her previsit labs are consistent with under replacement.  Her free T4 is within target, however TSH is still above target.  Her previous symptoms of neck compression have resolved.  She has no new complaints today.   She denies palpitations, tremor, heat/cold intolerance.     Review of Systems  Constitutional: With BMI of 22.4.     Objective:    BP (!) 158/82    Pulse 68    Ht _0  (1.676 m)    Wt 129 lb 3.2 oz (58.6 kg)    BMI 20.85 kg/m   Wt Readings from Last 3 Encounters:  06/01/21 129 lb 3.2 oz (58.6 kg)  04/16/21 145 lb (65.8 kg)  03/08/21 126 lb 3.2 oz (57.2 kg)    Physical Exam  Constitutional:  + appropriate weight for height, not in acute distress, normal state of mind Eyes: PERRLA, EOMI, no exophthalmos ENT:  Well-healed post thyroidectomy surgical scar on anterior lower neck.   no cervical lymphadenopathy  CMP ( most recent) CMP     Component Value Date/Time   NA 140 04/16/2021 1843   NA 142 10/13/2020 1004   K 3.4 (L) 04/16/2021 1843   CL 110 04/16/2021 1843   CO2 22 04/16/2021 1843   GLUCOSE 168 (H) 04/16/2021 1843   BUN 49 (H) 04/16/2021 1843   BUN 32 (H) 10/13/2020 1004   CREATININE 3.30 (H)  04/16/2021 1843   CALCIUM 8.2 (L) 04/16/2021 1843   PROT 6.2 (L) 04/16/2021 1843   PROT 7.2 10/13/2020 1004   ALBUMIN 3.0 (L) 04/16/2021 1843   ALBUMIN 4.6 10/13/2020 1004   AST 15 04/16/2021 1843   ALT 17 04/16/2021 1843   ALKPHOS 120 04/16/2021 1843   BILITOT 0.4 04/16/2021 1843   BILITOT 0.4 10/13/2020 1004   GFRNONAA 14 (L) 04/16/2021 1843   GFRAA 18 (L) 04/19/2020 1455     Diabetic Labs (most recent): Lab Results  Component Value Date   HGBA1C 6.3 (H) 01/11/2021   HGBA1C 5.4 07/19/2020   HGBA1C 6.1 01/21/2020     Lipid Panel ( most recent) Lipid Panel     Component Value Date/Time   CHOL 250 (H) 01/11/2021 0944   TRIG 78 01/11/2021 0944   HDL 99 01/11/2021 0944   CHOLHDL 2.5 01/11/2021 0944   CHOLHDL 4.4 06/02/2011 0431   VLDL 18 06/02/2011 0431   LDLCALC 138 (H) 01/11/2021 0944    Total thyroidectomy July 28, 2020.  Final microscopic diagnosis: Thyroid total thyroidectomy: Benign nodular hyperplasia.  Recent Results (from the past 2160 hour(s))  Protein electrophoresis, serum     Status: Abnormal   Collection Time: 03/08/21 12:39 PM  Result Value Ref Range   Total Protein ELP 6.1 6.0 - 8.5 g/dL   Albumin ELP 2.9 2.9 - 4.4 g/dL   Alpha-1-Globulin 0.3 0.0 - 0.4 g/dL   Alpha-2-Globulin 0.9 0.4 - 1.0 g/dL   Beta Globulin 1.3 0.7 - 1.3 g/dL   Gamma Globulin 0.8 0.4 - 1.8 g/dL   M-Spike, % 0.4 (H) Not Observed g/dL   SPE Interp. Comment     Comment: (NOTE) Faint band in beta region suspicious for monoclonal immunoglobulin. This band may represent a benign  spike as seen in older people or could be a paraprotein as seen in Multiple Myeloma, Waldenstrom's Macroglobulinemia or Lymphoma. Depending on clinical circumstances, further diagnostic studies may include serum immunofixation or serum free light chain quantitation. Performed At: [facility] [place], [place] [id number] Rush Farmer MD [id number]    Comment Comment     Comment: (NOTE) Protein electrophoresis scan will follow via computer, mail, or courier delivery.    Globulin, Total 3.2 2.2 - 3.9 g/dL   A/G Ratio 0.9 0.7 - 1.7  Immunofixation electrophoresis     Status: Abnormal   Collection Time: 03/08/21 12:39 PM  Result Value Ref Range   Total Protein ELP 6.1 6.0 - 8.5 g/dL   IgG (Immunoglobin G), Serum 752 586 - 1,602 mg/dL   IgA 430 (H) 64 - 422 mg/dL   IgM (Immunoglobulin M), Srm 34 26 - 217 mg/dL    Comment: (NOTE) Performed At: [facility] [place], [place] [id number] Rush Farmer MD [id number]    Immunofixation Result, Serum Comment (A)     Comment: (NOTE) Immunofixation shows IgG monoclonal protein with lambda light chain specificity. Monoclonal bands detected are faint. Suggest retesting in 4-6 months. Polyclonal increase detected in one or more immunoglobulins.   Lactate dehydrogenase     Status: Abnormal   Collection Time: 03/08/21 12:39 PM  Result Value Ref Range   LDH 277 (H) 98 - 192 U/L    Comment: Performed at [facility], [address], [place], [place] [address]  Beta 2 microglobulin, serum     Status: Abnormal   Collection Time: 03/08/21 12:39 PM  Result Value Ref Range   Beta-2 Microglobulin 3.9 (H) 0.6 -  2.4 mg/L    Comment: (NOTE) Siemens Immulite 2000 Immunochemiluminometric assay (ICMA) Values obtained with different assay methods or kits cannot be used interchangeably. Results cannot be interpreted as absolute evidence of the presence or absence of malignant disease. Performed At: [facility] [place], [place] [id number] Rush Farmer MD [id number]   Kappa/lambda light chains     Status: Abnormal   Collection Time: 03/08/21 12:39 PM  Result Value Ref Range   Kappa free light chain 89.9 (H) 3.3 - 19.4 mg/L   Lambda free light chains 42.8 (H) 5.7 - 26.3 mg/L   Kappa, lambda light chain ratio 2.10 (H) 0.26 - 1.65    Comment: (NOTE) Performed At: [facility] [place], [place] [id number] Rush Farmer MD IH:[id number]   CBG monitoring, ED     Status: Abnormal   Collection Time: 04/16/21  6:37 PM  Result Value Ref Range   Glucose-Capillary 152 (H) 70 - 99 mg/dL    Comment: Glucose reference range applies only to samples taken after fasting for at least 8 hours.  CBC WITH DIFFERENTIAL     Status: Abnormal   Collection Time: 04/16/21  6:43 PM  Result Value Ref Range   WBC 8.2 4.0 - 10.5 K/uL   RBC 3.69 (L) 3.87 - 5.11 MIL/uL   Hemoglobin 10.9 (L) 12.0 - 15.0 g/dL   HCT 33.5 (L) 36.0 - 46.0 %   MCV 90.8 80.0 - 100.0 fL   MCH 29.5 26.0 - 34.0 pg   MCHC 32.5 30.0 - 36.0 g/dL   RDW 14.5 11.5 - 15.5 %   Platelets 274 150 - 400 K/uL   nRBC 0.0 0.0 - 0.2 %   Neutrophils Relative % 67 %   Neutro Abs 5.5 1.7 - 7.7 K/uL   Lymphocytes Relative 19 %   Lymphs Abs 1.6 0.7 - 4.0 K/uL   Monocytes Relative 8 %   Monocytes Absolute 0.6 0.1 - 1.0 K/uL   Eosinophils Relative 4 %   Eosinophils Absolute 0.3 0.0 - 0.5 K/uL   Basophils Relative 1 %   Basophils Absolute 0.1 0.0 - 0.1 K/uL   Immature Granulocytes 1 %   Abs Immature Granulocytes 0.04 0.00 - 0.07 K/uL    Comment: Performed at [facility], [address] [place], [place] [address]  Comprehensive metabolic panel     Status: Abnormal   Collection Time: 04/16/21  6:43 PM  Result Value Ref Range   Sodium 140 135 - 145 mmol/L   Potassium 3.4 (L) 3.5 - 5.1 mmol/L   Chloride 110 98 - 111 mmol/L   CO2 22 22 - 32 mmol/L   Glucose, Bld 168 (H) 70 - 99 mg/dL    Comment:  Glucose reference range applies only to samples taken after fasting for at least 8 hours.   BUN 49 (H) 8 - 23 mg/dL   Creatinine, Ser 3.30 (H) 0.44 - 1.00 mg/dL   Calcium 8.2 (L) 8.9 - 10.3 mg/dL   Total Protein 6.2 (L) 6.5 - 8.1 g/dL   Albumin 3.0 (L) 3.5 - 5.0 g/dL   AST 15 15 - 41 U/L   ALT 17 0 - 44 U/L   Alkaline Phosphatase 120 38 - 126 U/L   Total Bilirubin 0.4 0.3 - 1.2 mg/dL   GFR, Estimated 14 (L) >60 mL/min    Comment: (NOTE) Calculated using the CKD-EPI Creatinine Equation (2021)    Anion gap 8 5 - 15    Comment:  Performed at [facility], [address] [place], [place] [address]  T4, Free     Status: None   Collection Time: 05/30/21 12:13 PM  Result Value Ref Range   Free T4 1.33 0.82 - 1.77 ng/dL  TSH     Status: Abnormal   Collection Time: 05/30/21 12:13 PM  Result Value Ref Range   TSH 27.500 (H) 0.450 - 4.500 uIU/mL     Assessment & Plan:   1. Toxic multinodular goiter-status post total thyroidectomy.  Neck compression symptoms resolved. Surgical pathology is benign.  No further intervention needed.  2.  Postsurgical hypothyroidism Her previsit thyroid function tests are indicated if of inconsistency taking her thyroid hormone.  She will not need a higher dose of levothyroxine.  She is advised to continue levothyroxine 100 mcg p.o. daily before breakfast .  - We discussed about the correct intake of her thyroid hormone, on empty stomach at fasting, with water, separated by at least 30 minutes from breakfast and other medications,  and separated by more than 4 hours from calcium, iron, multivitamins, acid reflux medications (PPIs). -Patient is made aware of the fact that thyroid hormone replacement is needed for life, dose to be adjusted by periodic monitoring of thyroid function tests.  3.  Hypertension: Uncontrolled.  She has enough medications and including amlodipine 10 mg p.o. daily, carvedilol 12.5 mg p.o. twice daily.  Again, she is not sure if she is taking  her blood pressure medication 1 time.  I will not add additional medications but reemphasized for her that she needs to be consistent on these medications until next measurement.   - I advised her  to maintain close follow up with [name], FNP for primary care needs.   I spent 31 minutes in the care of the patient today including review of labs from Thyroid Function, CMP, and other relevant labs ; imaging/biopsy records (current and previous including abstractions from other facilities); face-to-face time discussing  her lab results and symptoms, medications doses, her options of short and long term treatment based on the latest standards of care / guidelines;   and documenting the encounter.  Erin Jordan  participated in the discussions, expressed understanding, and voiced agreement with the above plans.  All questions were answered to her satisfaction. she is encouraged to contact clinic should she have any questions or concerns prior to her return visit.   Follow up plan: Return in about 6 months (around 11/29/2021) for F/U with Pre-visit Labs.   Glade Lloyd, MD [facility] [address] [place], [place] [address] Phone: [phone]  Fax: [phone]     06/01/2021, 1:35 PM  This note was partially dictated with voice recognition software. Similar sounding words can be transcribed inadequately or may not  be corrected upon review.

## 2021-06-04 ENCOUNTER — Telehealth: Payer: Self-pay | Admitting: Family Medicine

## 2021-06-04 NOTE — Telephone Encounter (Signed)
Pt's son needed med list to take to pharmacy but he found the med list from the last hospital and used that.

## 2021-06-05 ENCOUNTER — Ambulatory Visit: Payer: Medicare HMO | Admitting: "Endocrinology

## 2021-06-28 ENCOUNTER — Other Ambulatory Visit: Payer: Self-pay | Admitting: Family Medicine

## 2021-06-28 DIAGNOSIS — F0390 Unspecified dementia without behavioral disturbance: Secondary | ICD-10-CM

## 2021-06-28 DIAGNOSIS — I10 Essential (primary) hypertension: Secondary | ICD-10-CM

## 2021-07-03 ENCOUNTER — Ambulatory Visit (HOSPITAL_COMMUNITY)
Admission: RE | Admit: 2021-07-03 | Discharge: 2021-07-03 | Disposition: A | Discharge status: Home / Self Care | Payer: Medicare HMO | Source: Ambulatory Visit | Attending: Urology | Admitting: Urology

## 2021-07-03 ENCOUNTER — Other Ambulatory Visit: Payer: Self-pay

## 2021-07-03 DIAGNOSIS — N2889 Other specified disorders of kidney and ureter: Secondary | ICD-10-CM | POA: Insufficient documentation

## 2021-07-03 IMAGING — US US RENAL
1 series · 14 of 25 positions shown · non-contrast
Comparison: [DATE]

CLINICAL DATA: Follow-up right renal mass.

EXAM:
RENAL / URINARY TRACT ULTRASOUND COMPLETE

[Series 1: us renal · 14 of 48 slices shown]
[im 1/48]
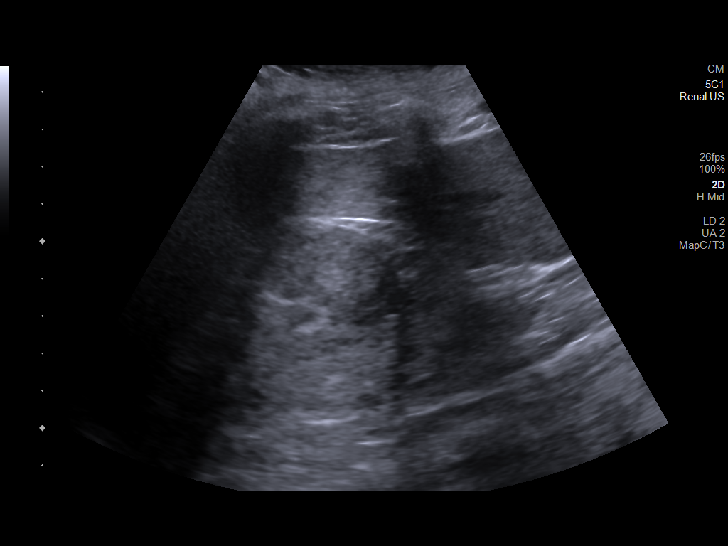
[im 4/48]
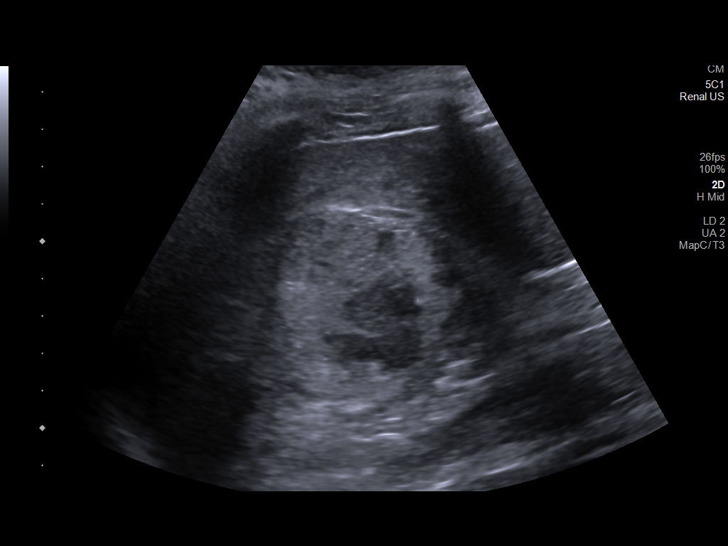
[im 8/48]
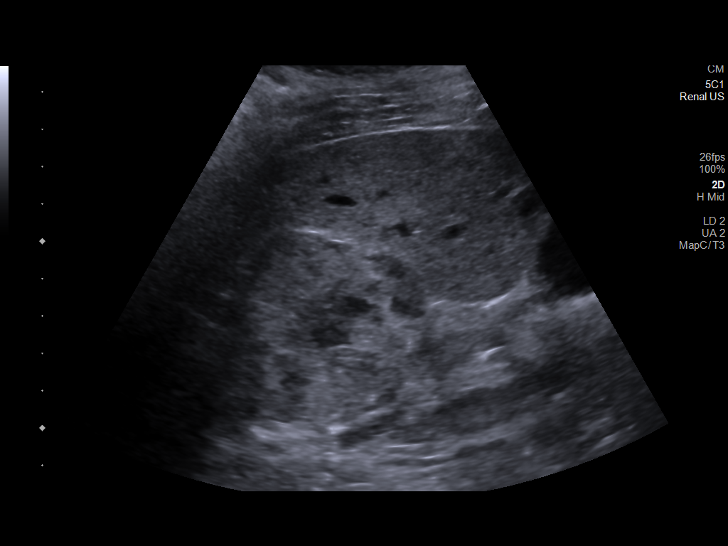
[im 12/48]
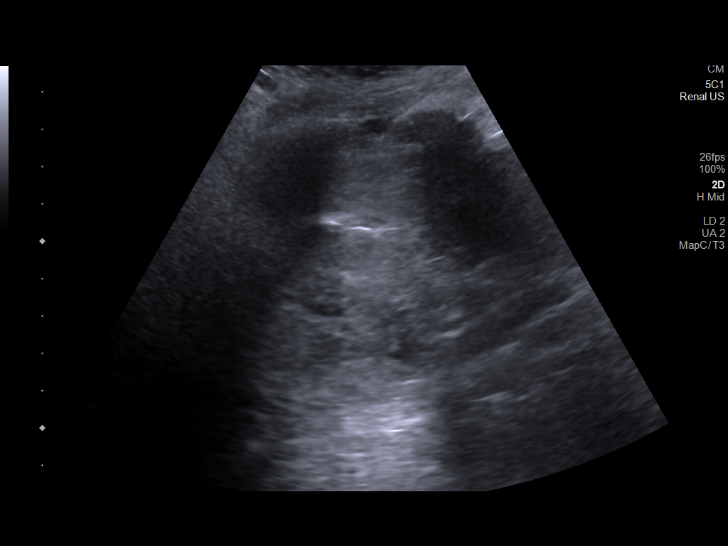
[im 16/48]
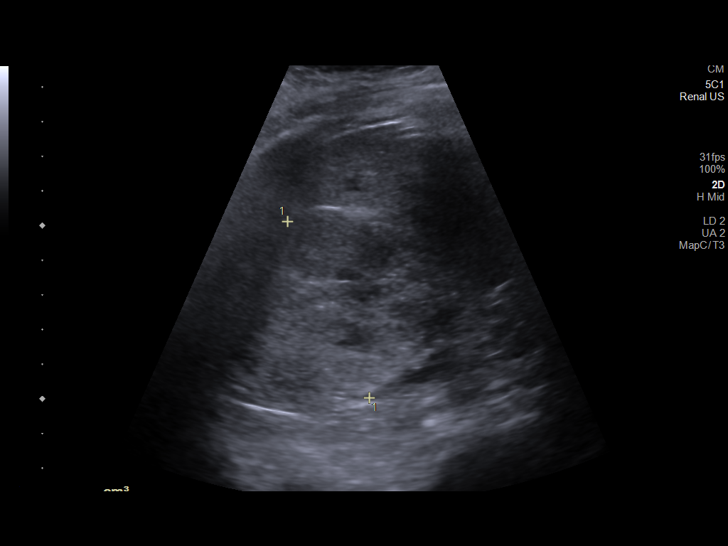
[im 18/48]
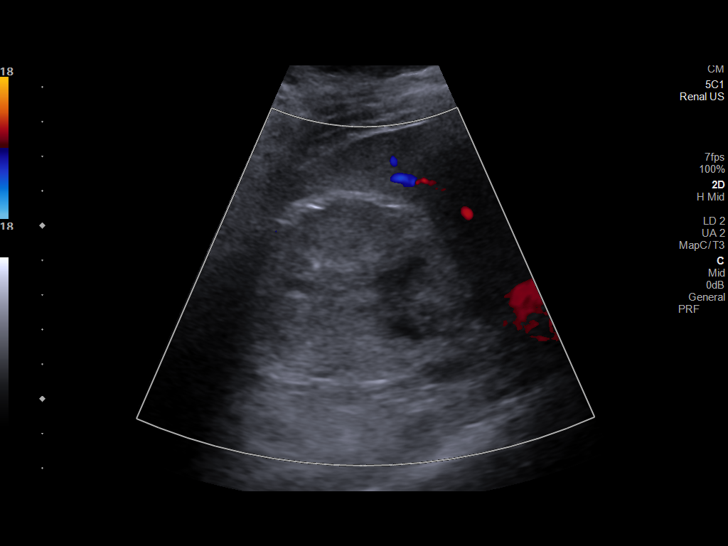
[im 22/48]
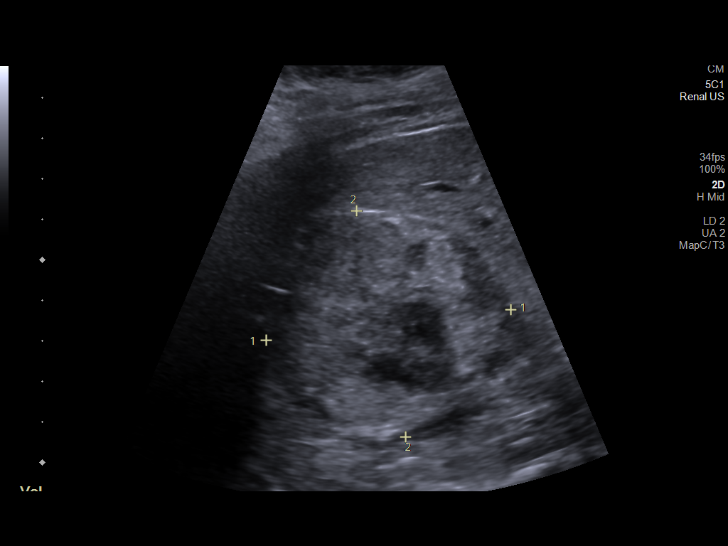
[im 26/48]
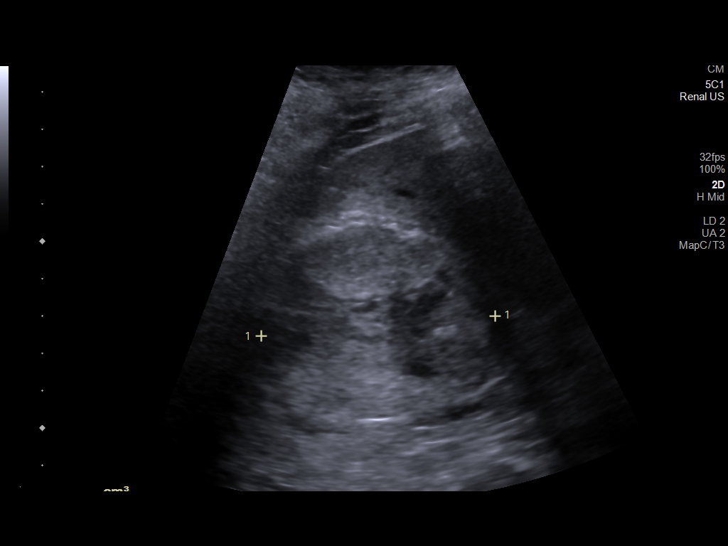
[im 30/48]
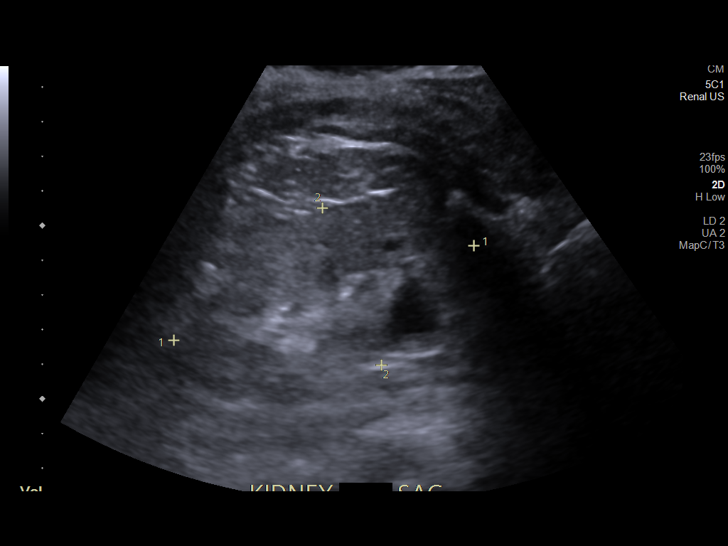
[im 32/48]
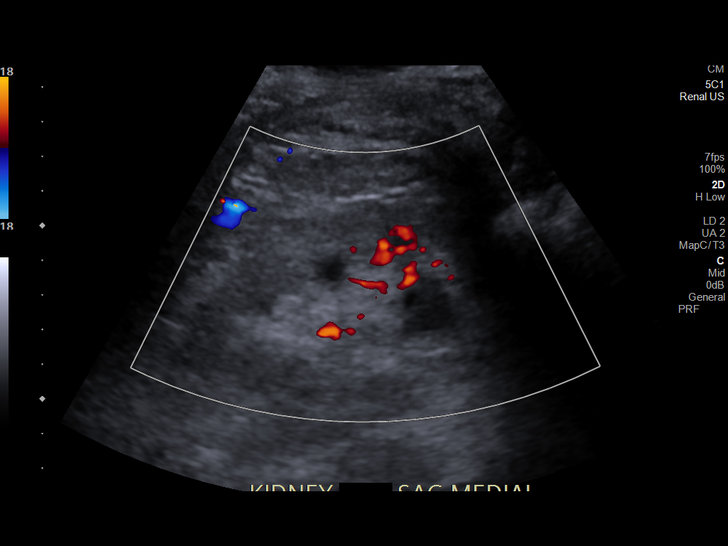
[im 36/48]
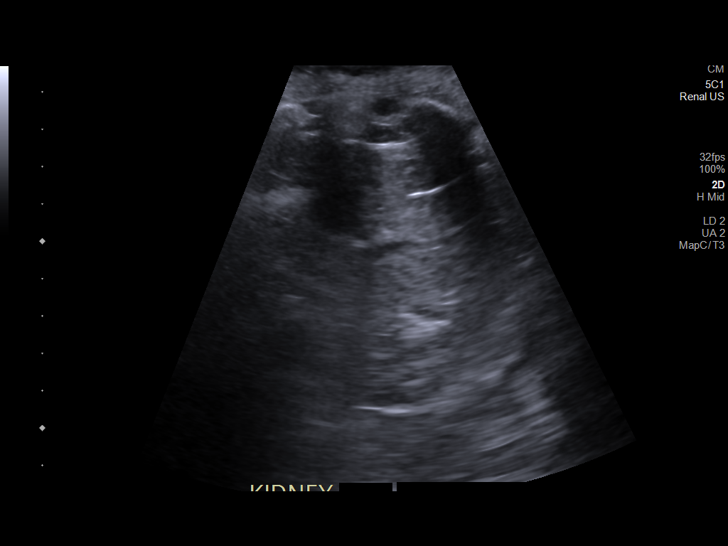
[im 40/48]
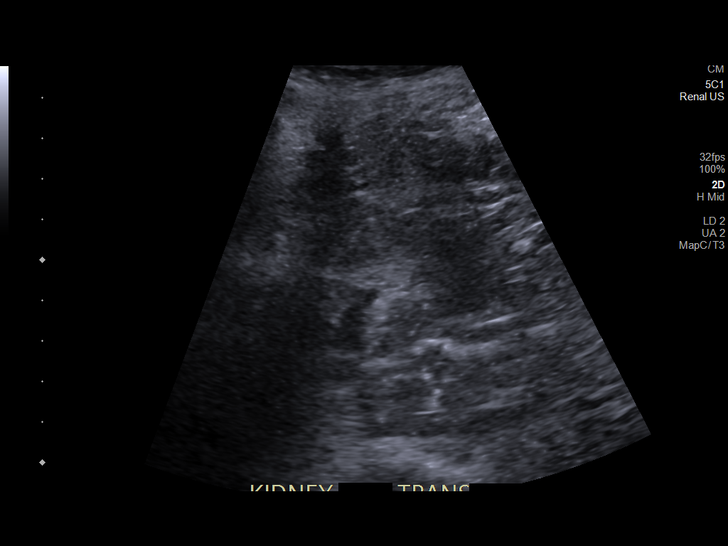
[im 44/48]
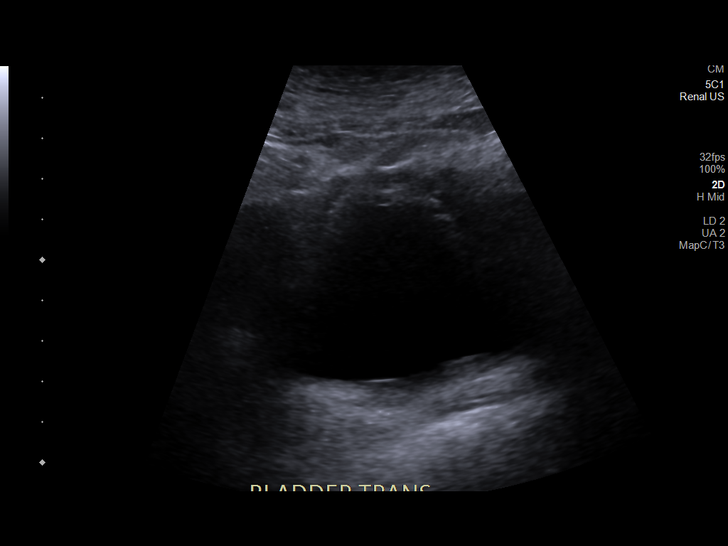
[im 48/48]
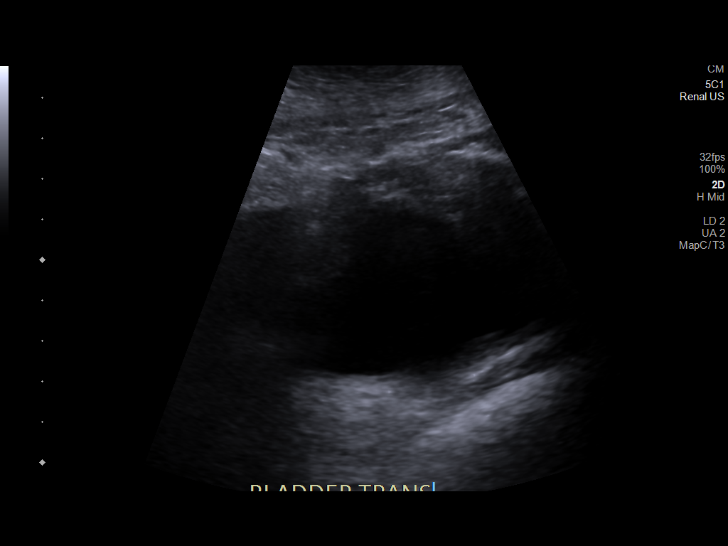

[14 of 25 positions shown; findings below may reference images not displayed]

FINDINGS: Right Kidney:

Renal measurements: 7.6 cm x 5.6 cm x 5.6 cm = volume: 123.91 mL.
Diffusely increased echogenicity of the renal parenchyma is noted. A
6.1 cm x 5.7 cm x 6.3 cm heterogeneous hyperechoic mass is seen
within the mid to upper right kidney. This is predominantly stable
in size and appearance when compared to the prior study. No
hydronephrosis is visualized.

Left Kidney:

Renal measurements: 9.1 cm x 4.8 cm x 4.0 cm = volume: 91.1 mL.
Diffusely increased echogenicity of the renal parenchyma is noted.
No mass or hydronephrosis visualized.

Bladder:

Appears normal for degree of bladder distention.

Other:

None.
IMPRESSION: 1. Bilateral echogenic kidneys, likely secondary to medical renal
disease.
2. Stable heterogeneous right renal mass, suspicious for an
underlying neoplastic process. MRI correlation is recommended.

## 2021-07-11 ENCOUNTER — Ambulatory Visit: Payer: Medicare HMO | Admitting: Urology

## 2021-07-19 LAB — T4, FREE: Free T4: 1.19 ng/dL (ref 0.82–1.77)

## 2021-07-19 LAB — TSH: TSH: 48 u[IU]/mL — ABNORMAL HIGH (ref 0.450–4.500)

## 2021-07-30 ENCOUNTER — Other Ambulatory Visit: Payer: Self-pay | Admitting: Family Medicine

## 2021-07-30 DIAGNOSIS — E785 Hyperlipidemia, unspecified: Secondary | ICD-10-CM

## 2021-07-30 DIAGNOSIS — I10 Essential (primary) hypertension: Secondary | ICD-10-CM

## 2021-07-30 NOTE — Telephone Encounter (Signed)
Made appt for 5/11 ?

## 2021-07-30 NOTE — Telephone Encounter (Signed)
Britney NTBS 30 days given 06/28/21 ?

## 2021-07-31 ENCOUNTER — Telehealth: Payer: Self-pay | Admitting: Family Medicine

## 2021-07-31 DIAGNOSIS — I10 Essential (primary) hypertension: Secondary | ICD-10-CM

## 2021-07-31 DIAGNOSIS — E785 Hyperlipidemia, unspecified: Secondary | ICD-10-CM

## 2021-07-31 MED ORDER — AMLODIPINE BESYLATE 10 MG PO TABS: 10.0000 mg | ORAL_TABLET | Freq: Every day | ORAL | 0 refills | Status: DC

## 2021-07-31 MED ORDER — ATORVASTATIN CALCIUM 80 MG PO TABS: 80.0000 mg | ORAL_TABLET | Freq: Every day | ORAL | 0 refills | Status: DC

## 2021-07-31 NOTE — Telephone Encounter (Signed)
Patient informed. 

## 2021-07-31 NOTE — Telephone Encounter (Signed)
Refills sent x30 days. Patient needs to keep this appointment for further refills. ?

## 2021-07-31 NOTE — Telephone Encounter (Signed)
?  Prescription Request ? ?07/31/2021 ? ?Is this a "Controlled Substance" medicine? no ? ?Have you seen your PCP in the last 2 weeks? No pt has appt with PCP on 08/16/21  ? ?If YES, route message to pool  -  If NO, patient needs to be scheduled for appointment. ? ?What is the name of the medication or equipment? amLODipine (NORVASC) 10 MG tablet [Pharmacy Med Name: AMLODIPINE BESYLATE 10 MG TAB] ?atorvastatin (LIPITOR) 80 MG tablet [Pharmacy Med Name: ATORVASTATIN 80 MG TABLET ? ?Have you contacted your pharmacy to request a refill? yes  ? ?Which pharmacy would you like this sent to? [facility] ? ? ?Patient notified that their request is being sent to the clinical staff for review and that they should receive a response within 2 business days.  ? ? ?

## 2021-08-07 ENCOUNTER — Ambulatory Visit (INDEPENDENT_AMBULATORY_CARE_PROVIDER_SITE_OTHER): Payer: Medicare HMO

## 2021-08-07 ENCOUNTER — Encounter: Payer: Self-pay | Admitting: Family Medicine

## 2021-08-07 ENCOUNTER — Ambulatory Visit (INDEPENDENT_AMBULATORY_CARE_PROVIDER_SITE_OTHER): Payer: Medicare HMO | Admitting: Family Medicine

## 2021-08-07 VITALS — BP 197/83 | HR 75 | Temp 96.4°F | Ht 66.0 in | Wt 134.8 lb

## 2021-08-07 DIAGNOSIS — I10 Essential (primary) hypertension: Secondary | ICD-10-CM

## 2021-08-07 DIAGNOSIS — M25561 Pain in right knee: Secondary | ICD-10-CM

## 2021-08-07 DIAGNOSIS — Z91148 Patient's other noncompliance with medication regimen for other reason: Secondary | ICD-10-CM | POA: Diagnosis not present

## 2021-08-07 DIAGNOSIS — M79601 Pain in right arm: Secondary | ICD-10-CM

## 2021-08-07 DIAGNOSIS — W19XXXA Unspecified fall, initial encounter: Secondary | ICD-10-CM

## 2021-08-07 DIAGNOSIS — F0390 Unspecified dementia without behavioral disturbance: Secondary | ICD-10-CM

## 2021-08-07 IMAGING — DX DG KNEE 1-2V*R*
2 series · 2 of 2 positions shown · non-contrast
Comparison: Metastatic bone survey dated [DATE].

CLINICAL DATA: Right knee pain after fall.

EXAM:
RIGHT KNEE - 1-2 VIEW

[knee ap]
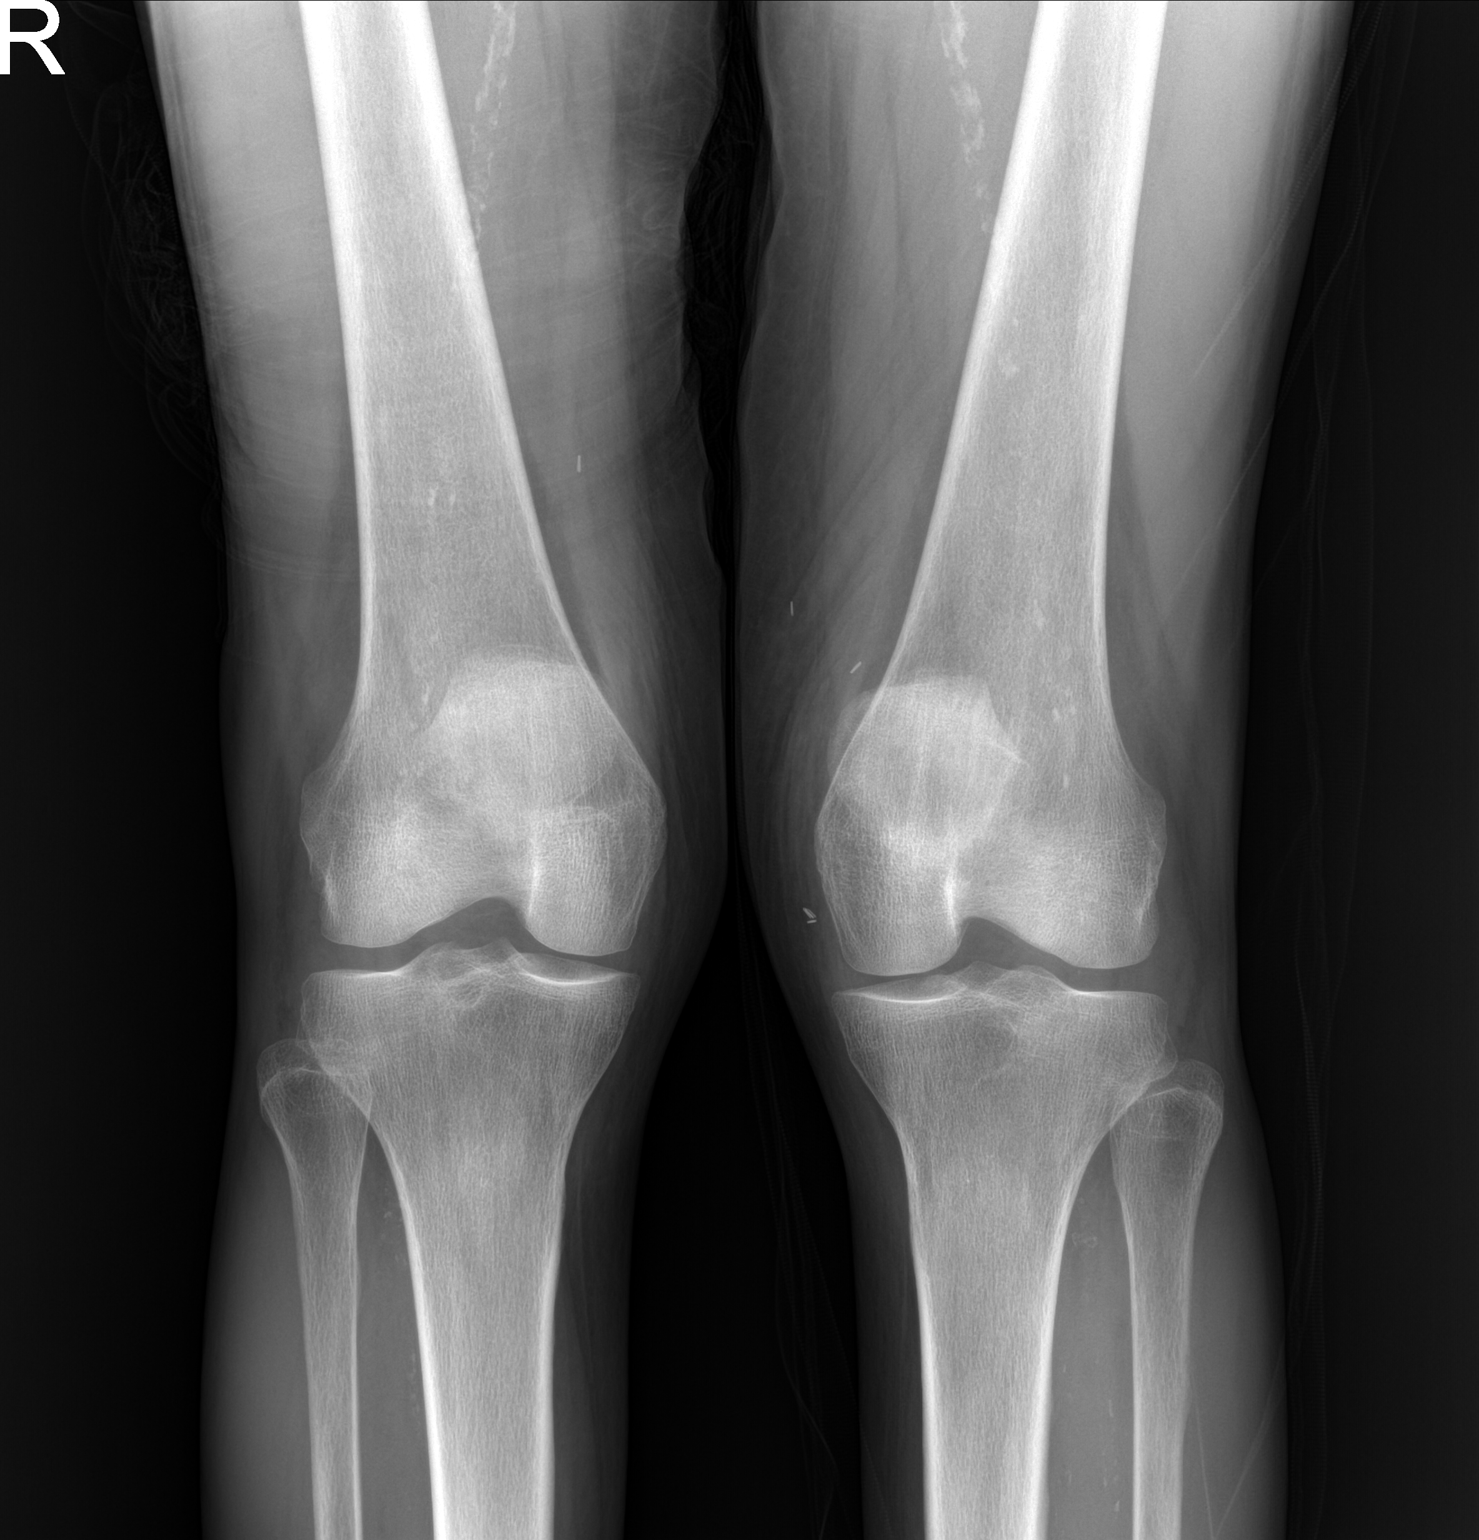

[knee lat]
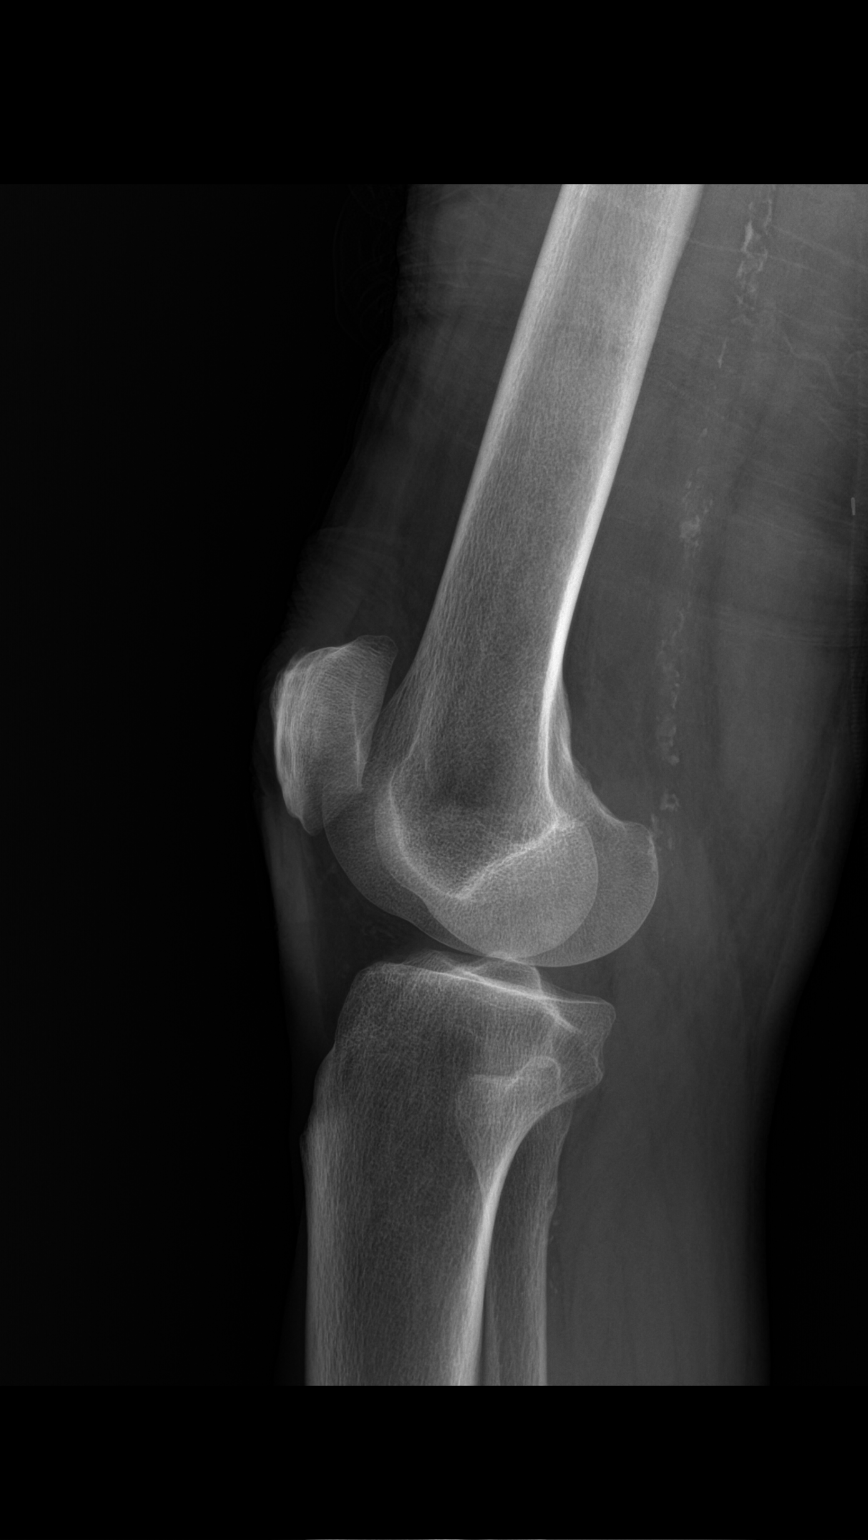

[2 of 2 positions shown; findings below may reference images not displayed]

FINDINGS: No evidence of fracture, dislocation, or joint effusion. No evidence
of arthropathy or other focal bone abnormality. Soft tissues are
unremarkable.
IMPRESSION: Negative.

## 2021-08-07 IMAGING — DX DG FOREARM 2V*R*
3 series · 3 of 3 positions shown · non-contrast
Comparison: [DATE]

CLINICAL DATA: Right arm pain after fall

EXAM:
RIGHT FOREARM - 2 VIEW

[forearm ap]
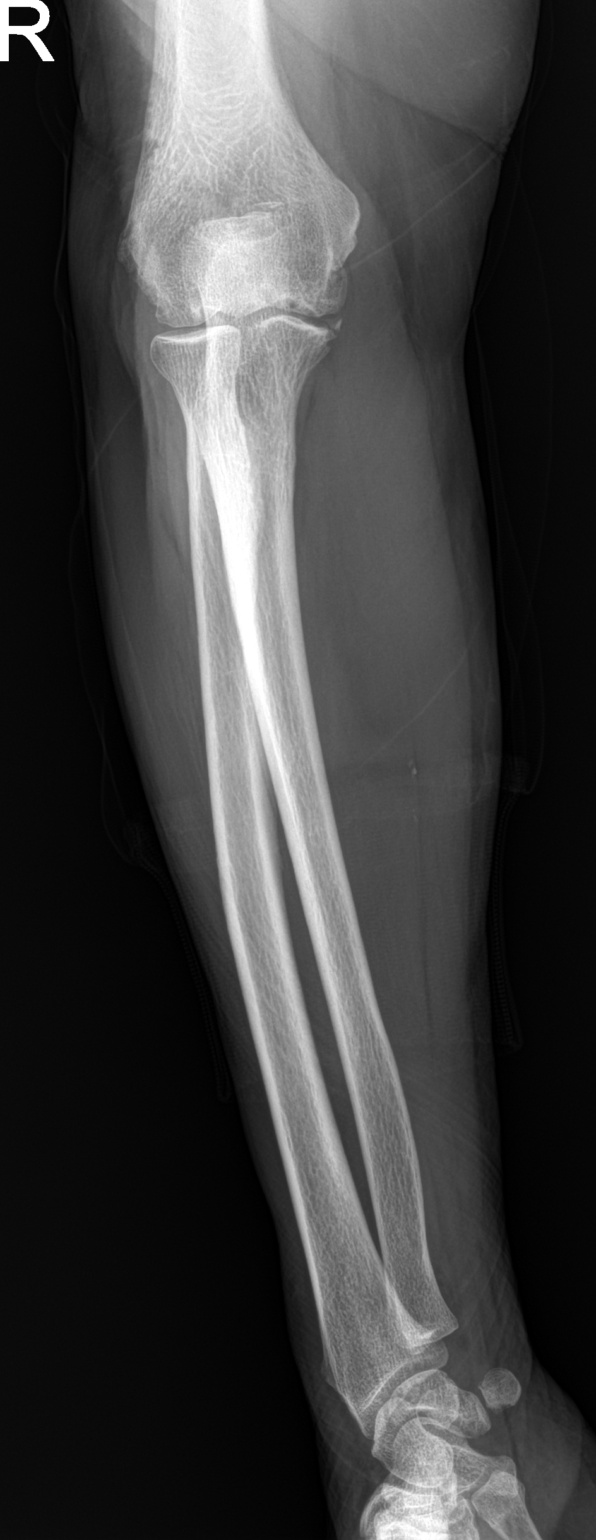

[forearm lat]
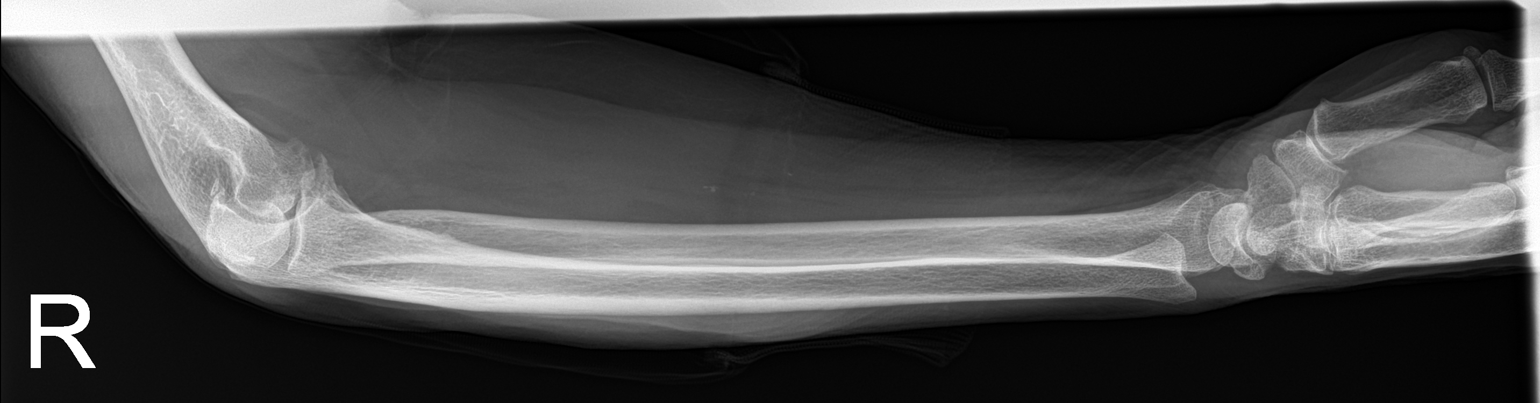

[wrist ap]
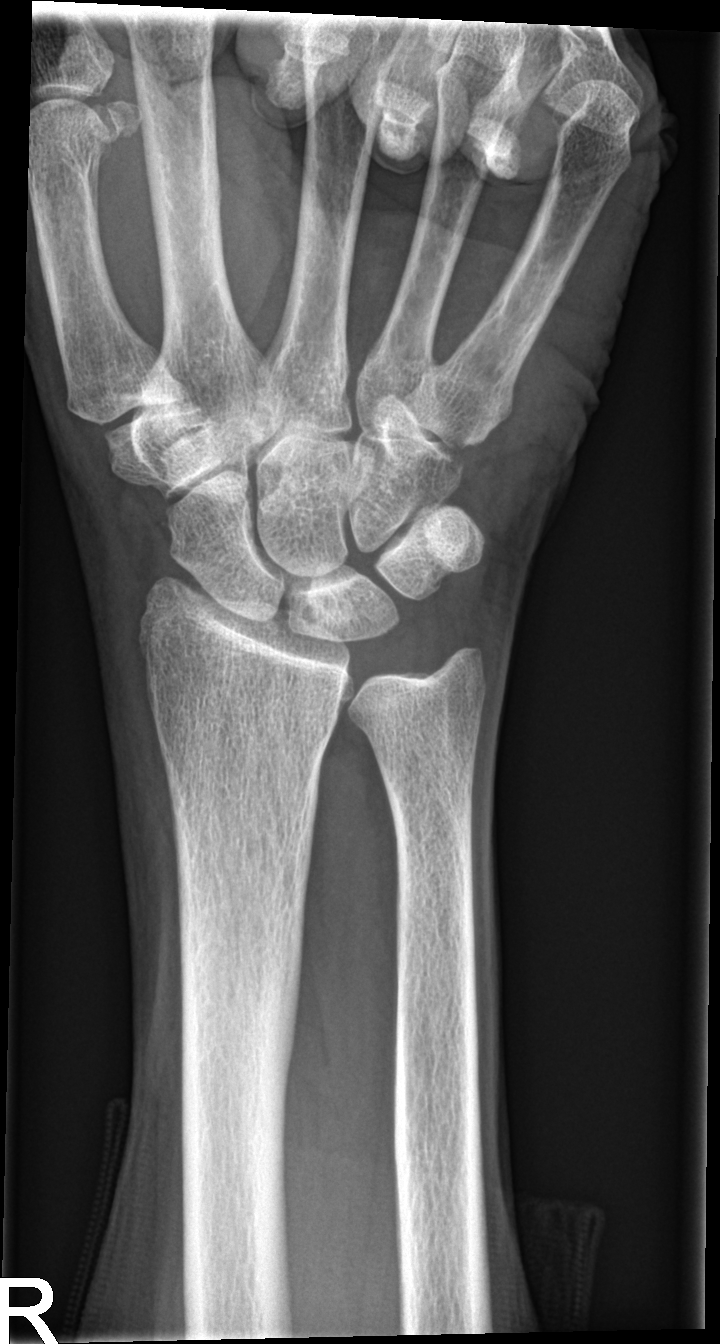

[3 of 3 positions shown; findings below may reference images not displayed]

FINDINGS: Fragmentation along the medial margin of the proximal ulna at the
elbow joint. Elsewhere, no fracture. No dislocation. Degenerative
changes of the elbow. Possible elbow joint effusion, which is
suboptimally assessed given lack of true lateral view. No focal soft
tissues swelling.
IMPRESSION: 1. Fragmentation along the medial margin of the proximal ulna at the
elbow joint. This could represent a fracture or degenerative
fragmented osteophyte. Further evaluation with dedicated radiographs
of the right elbow is recommended.
2. Elsewhere, no additional fracture or dislocation.

## 2021-08-07 IMAGING — DX DG ELBOW 2V*R*
4 series · 4 of 4 positions shown · non-contrast
Comparison: RIGHT forearm radiographs [DATE]

CLINICAL DATA: RIGHT arm pain post fall

EXAM:
RIGHT ELBOW - 2 VIEW

[elbow ap]
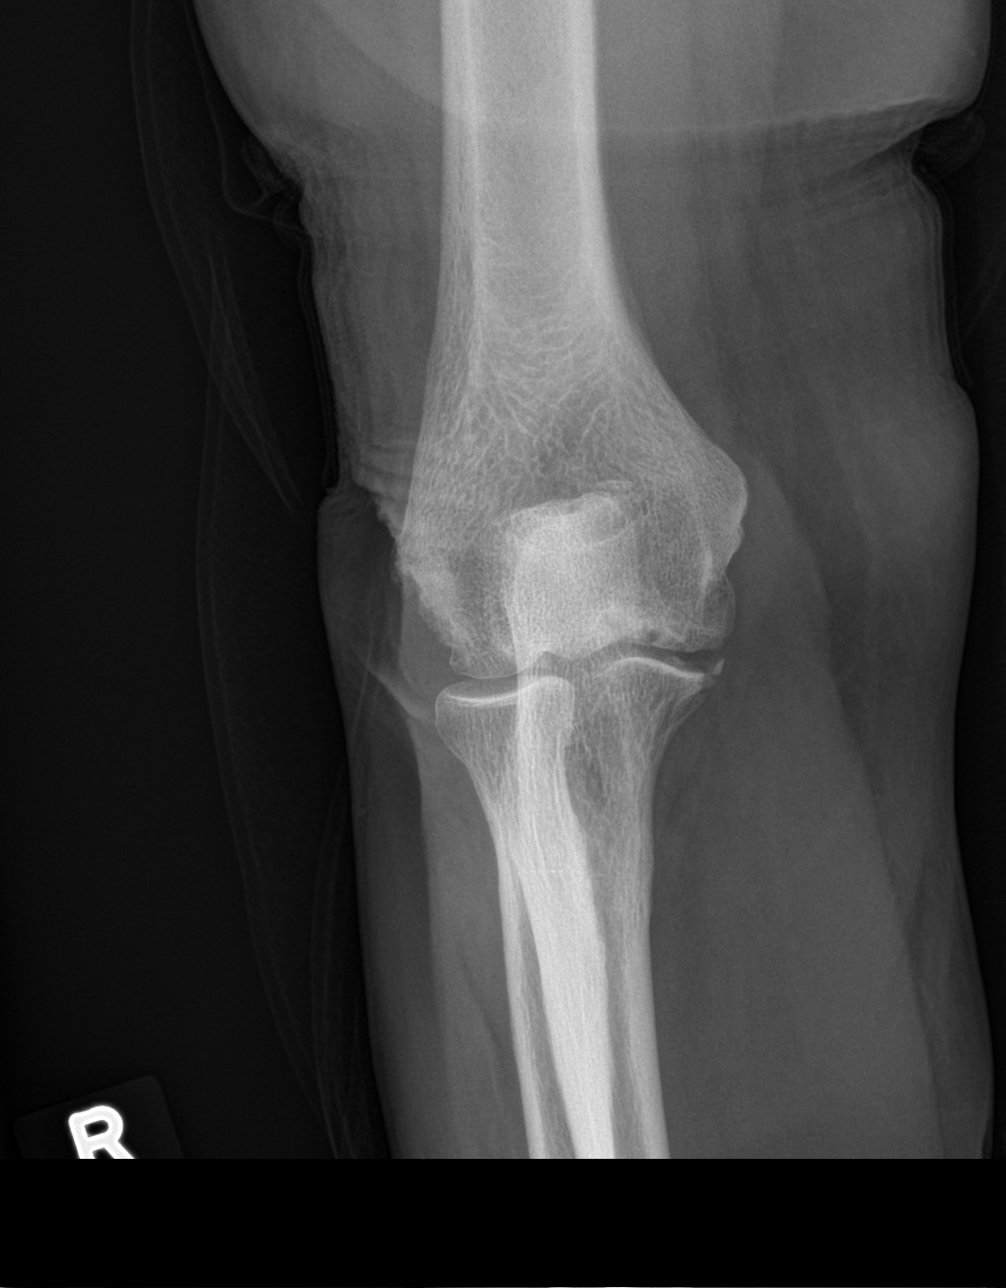

[elbow lat]
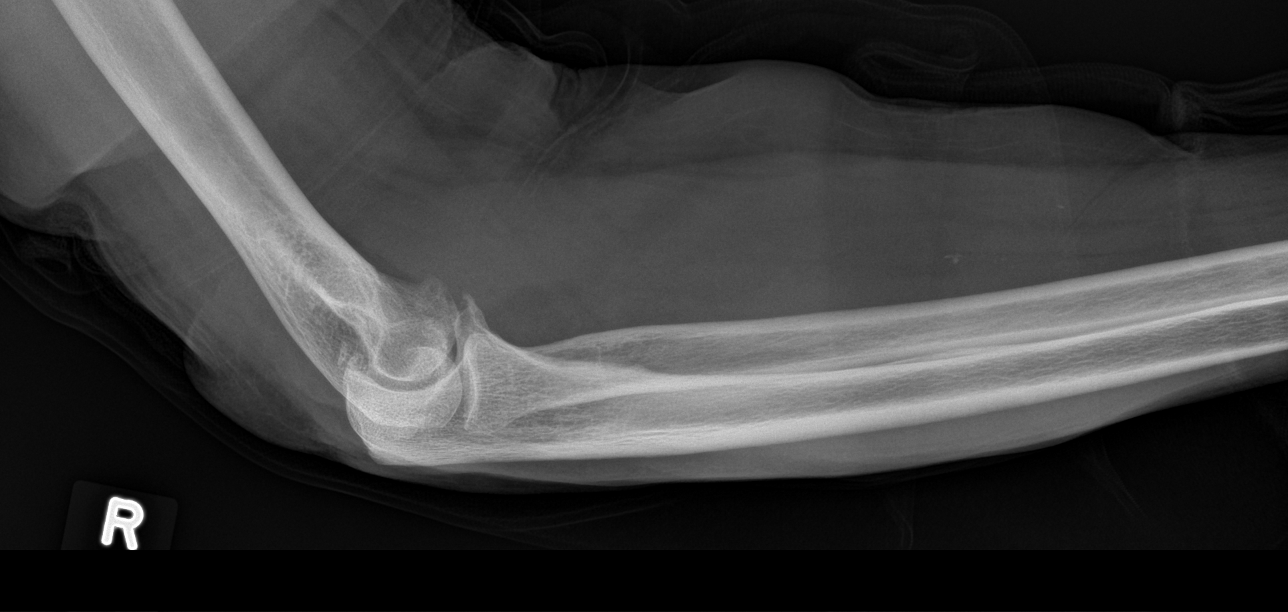

[elbow obl (1 of 2)]
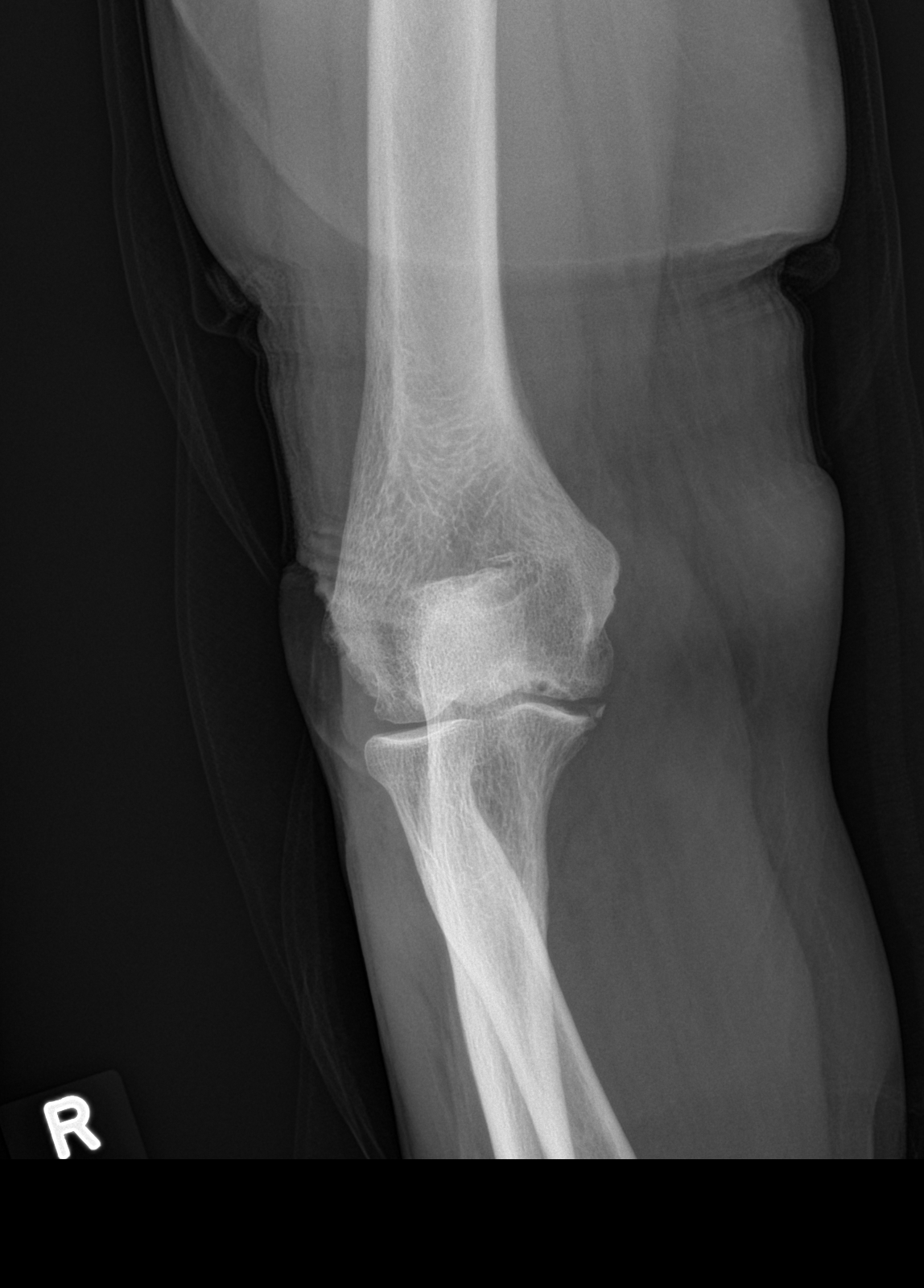

[elbow obl (2 of 2)]
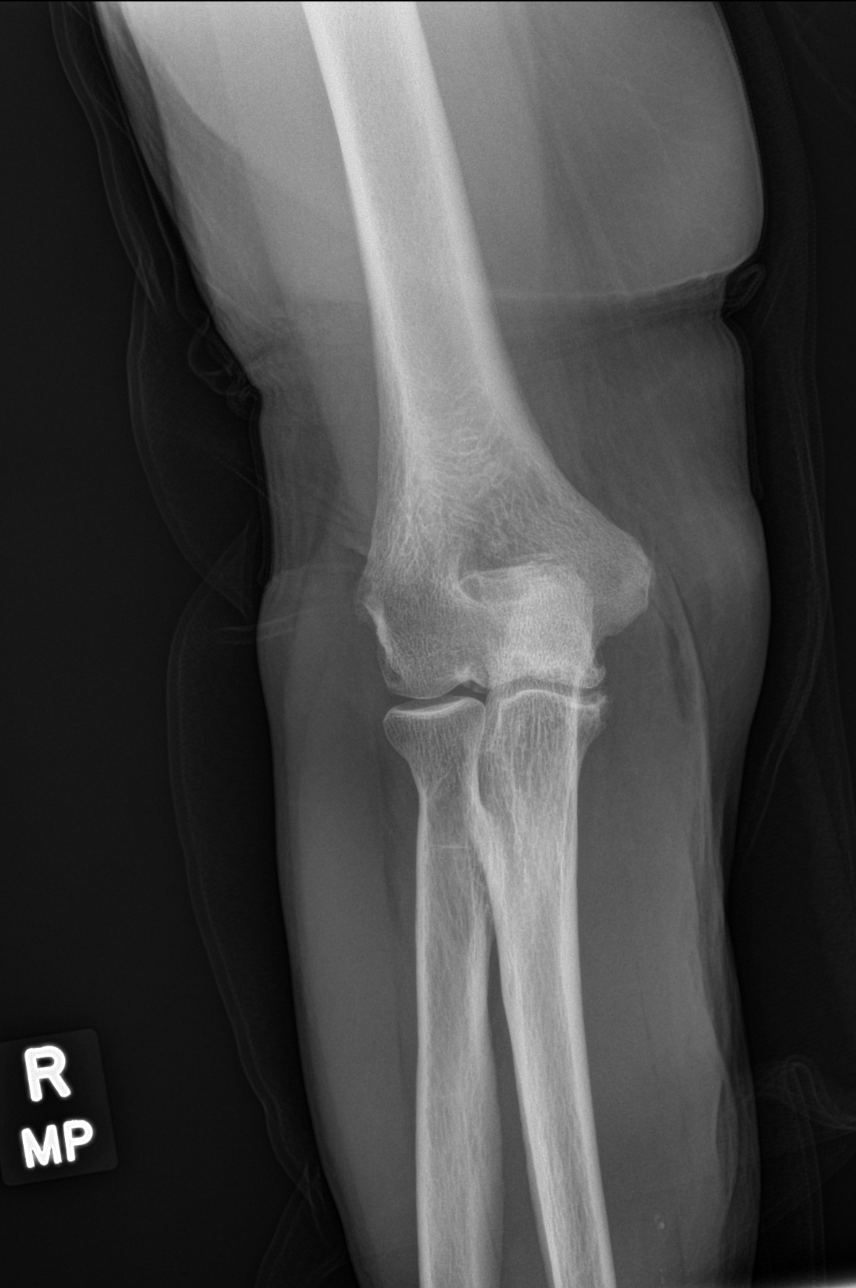

[4 of 4 positions shown; findings below may reference images not displayed]

FINDINGS: Osseous mineralization normal.

Joint spaces preserved.

No elbow joint effusion.

Mild spurring at the coronoid process of ulna medially with a small
adjacent triangular ossified density, question corticated, favor
old.

No additional fracture or dislocation.
IMPRESSION: Small triangular ossified density adjacent to the coronoid process
of the ulna, question corticated.

No associated joint effusion.

Suspect this is old but if patient has persistent pain or tenderness
at this site, this could represent an atypical fracture.

## 2021-08-07 NOTE — Progress Notes (Signed)
? ?Assessment & Plan:  ?1. Dementia without behavioral disturbance ([place]) ?FL2 form completed. ? ?2. Noncompliance w/medication treatment due to intermit use of medication ?FL2 form completed.  Encouraged daughter-in-law to take medication list home and check for what she has and does not have so that she can let me know when they return next week. ? ?3. Essential hypertension ?Uncontrolled, the patient has not had her medication yet today for appropriate assessment.  Advised to monitor blood pressure and keep a log to bring with her to her appointment next week. ? ?4. Acute pain of right knee ?Tylenol for pain. ?- DG Knee 1-2 Views Right ? ?5. Right arm pain ?Tylenol for pain. ?- DG Forearm Right ? ?6. Fall, initial encounter ?- DG Knee 1-2 Views Right ?- DG Forearm Right ? ? ?Return as scheduled. ? ?Hendricks Limes, MSN, APRN, FNP-C ?[place] ? ?Subjective:  ? ? Patient ID: Erin Jordan, female    DOB: Sep 08, 1945, 76 y.o.   MRN: [id number] ? ?Patient Care Team: ?[name], FNP as PCP - General (Family Medicine) ?Satira Sark, MD as PCP - Cardiology (Cardiology) ?Donetta Potts, RN as Oncology Nurse Navigator ?Derek Jack, MD as Consulting Physician (Hematology) ?Satira Sark, MD as Consulting Physician (Cardiology) ?Cassandria Anger, MD as Consulting Physician (Endocrinology) ?McKenzie, Candee Furbish, MD as Consulting Physician (Urology)  ? ?Chief Complaint:  ?Chief Complaint  ?Patient presents with  ? FL2  ? Fall  ?  In employee parking lot on the steps   ? ? ?HPI: ?Erin Jordan is a 76 y.o. female presenting on 08/07/2021 for FL2 and Fall (In employee parking lot on the steps ) ? ?Patient is accompanied by her daughter-in-law.  ? ?Patient is here today to complete a FL2 for placement in a skilled nursing facility with a memory care unit.  The plan is to try to get her into [facility] or [facility]. ? ?It does not appear she has been compliant with  taking all of her medications.  Based on her last refill dates she is out of Aricept, Zetia, and trazodone.  Her daughter-in-law reports the patient's son gives her her medication as she does not remember.  She states sometimes he may miss a day or two but never more than that.  She has not had any of her medications this morning. ? ?New complaints: ?Patient fell coming into our office.  She was walking around the backside of the building and tripped on an uneven step.  Reports right knee and right arm pain. ? ? ?Social history: ? ?Relevant past medical, surgical, family and social history reviewed and updated as indicated. Interim medical history since our last visit reviewed. ? ?Allergies and medications reviewed and updated. ? ?DATA REVIEWED: CHART IN EPIC ? ?ROS: Negative unless specifically indicated above in HPI.  ? ? ?Current Outpatient Medications:  ?  amLODipine (NORVASC) 10 MG tablet, Take 1 tablet (10 mg total) by mouth daily. (NEEDS TO BE SEEN BEFORE NEXT REFILL), Disp: 30 tablet, Rfl: 0 ?  aspirin EC 81 MG tablet, Take 1 tablet (81 mg total) by mouth daily. Swallow whole., Disp: 30 tablet, Rfl: 5 ?  atorvastatin (LIPITOR) 80 MG tablet, Take 1 tablet (80 mg total) by mouth daily. (NEEDS TO BE SEEN BEFORE NEXT REFILL), Disp: 30 tablet, Rfl: 0 ?  carvedilol (COREG) 12.5 MG tablet, Take 1 tablet (12.5 mg total) by mouth 2 (two) times daily. For BP and Heart, Disp:  180 tablet, Rfl: 1 ?  Cholecalciferol 25 MCG (1000 UT) capsule, Take by mouth., Disp: , Rfl:  ?  donepezil (ARICEPT) 10 MG tablet, TAKE (1) TABLET BY MOUTH AT BEDTIME., Disp: 30 tablet, Rfl: 0 ?  ezetimibe (ZETIA) 10 MG tablet, Take 1 tablet (10 mg total) by mouth daily., Disp: 30 tablet, Rfl: 2 ?  levothyroxine (SYNTHROID) 100 MCG tablet, Take 1 tablet (100 mcg total) by mouth daily before breakfast., Disp: 90 tablet, Rfl: 1 ?  traZODone (DESYREL) 50 MG tablet, TAKE (1) TABLET BY MOUTH AT BEDTIME., Disp: 30 tablet, Rfl: 0  ? ?No Known  Allergies ?Past Medical History:  ?Diagnosis Date  ? Arthritis   ? Cancer of kidney ([place])   ? CKD (chronic kidney disease), stage IV ([place])   ? Coronary atherosclerosis of native coronary artery 2006  ? 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 Essential hypertension   ? 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 History of diabetes mellitus, type II   ? Hyperlipidemia   ? Hypothyroidism   ? NSTEMI (non-ST elevated myocardial infarction) ([place]) 06/24/2017  ? Pneumonia   ? hx of 2021   ? Renal hematoma   ? Right renal mass   ? 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  ?Past Surgical History:  ?Procedure Laterality Date  ? ABDOMINAL HYSTERECTOMY    ? CORONARY ARTERY BYPASS GRAFT  2006  ? [place], [place]  ? CORONARY STENT INTERVENTION N/A 06/25/2017  ? Procedure: CORONARY STENT INTERVENTION;  Surgeon: Jettie Booze, MD;  Location: [facility];  Service: Cardiovascular;  Laterality: N/A;  ? IR RADIOLOGIST EVAL & MGMT  07/08/2019  ? LEFT HEART CATH AND CORS/GRAFTS ANGIOGRAPHY N/A 06/25/2017  ? Procedure: LEFT HEART CATH AND CORS/GRAFTS ANGIOGRAPHY;  Surgeon: Jettie Booze, MD;  Location: [facility];  Service: Cardiovascular;  Laterality: N/A;  ? THYROIDECTOMY N/A 07/28/2020  ? Procedure: TOTAL THYROIDECTOMY;  Surgeon: Armandina Gemma, MD;  Location: [facility];  Service: General;  Laterality: N/A;  2 HOURS  ?  ?Social History  ? ?Socioeconomic History  ? Marital status: Widowed  ?  Spouse name: Not on file  ? Number of children: 6  ? Years of education: Not on file  ? Highest education level: Not on file  ?Occupational History  ? Occupation: retired  ?Tobacco Use  ? Smoking status: Never  ? Smokeless tobacco: Never  ?Vaping Use  ? Vaping Use: Never used  ?Substance and Sexual Activity  ? Alcohol use: No  ? Drug use: No  ? Sexual activity: Never  ?Other Topics Concern  ? Not on file  ?Social History Narrative  ? Lives in [place]  with her daughter and grandchildren.  ? She has dementia  ? ?Social Determinants of Health  ? ?Financial Resource Strain: Medium Risk  ? Difficulty of Paying Living Expenses: Somewhat hard  ?Food Insecurity: No Food Insecurity  ? Worried About Charity fundraiser in the Last Year: Never true  ? Ran Out of Food in the Last Year: Never true  ?Transportation Needs: No Transportation Needs  ? Lack of Transportation (Medical): No  ? Lack of Transportation (Non-Medical): No  ?Physical Activity: Insufficiently Active  ? Days of Exercise per Week: 7 days  ? Minutes of Exercise per Session: 20 min  ?Stress: No Stress Concern Present  ? Feeling of Stress : Only a little  ?Social Connections: Moderately Isolated  ?  Frequency of Communication with Friends and Family: More than three times a week  ? Frequency of Social Gatherings with Friends and Family: More than three times a week  ? Attends Religious Services: 1 to 4 times per year  ? Active Member of Clubs or Organizations: No  ? Attends Archivist Meetings: Never  ? Marital Status: Widowed  ?Intimate Partner Violence: Not At Risk  ? Fear of Current or Ex-Partner: No  ? Emotionally Abused: No  ? Physically Abused: No  ? Sexually Abused: No  ?  ? ?   ?Objective:  ?  ?BP (!) 197/83   Pulse 75   Temp (!) 96.4 ?F (35.8 ?C) (Temporal)   Ht '5\' 6"'$  (1.676 m)   Wt 134 lb 12.8 oz (61.1 kg)   SpO2 100%   BMI 21.76 kg/m?  ? ?Wt Readings from Last 3 Encounters:  ?08/07/21 134 lb 12.8 oz (61.1 kg)  ?06/01/21 129 lb 3.2 oz (58.6 kg)  ?04/16/21 145 lb (65.8 kg)  ? ? ?Physical Exam ?Vitals reviewed.  ?Constitutional:   ?   General: She is not in acute distress. ?   Appearance: Normal appearance. She is normal weight. She is not ill-appearing, toxic-appearing or diaphoretic.  ?HENT:  ?   Head: Normocephalic and atraumatic.  ?Eyes:  ?   General: No scleral icterus.    ?   Right eye: No discharge.     ?   Left eye: No discharge.  ?   Conjunctiva/sclera: Conjunctivae normal.   ?Cardiovascular:  ?   Rate and Rhythm: Normal rate and regular rhythm.  ?   Heart sounds: Normal heart sounds. No murmur heard. ?  No friction rub. No gallop.  ?Pulmonary:  ?   Effort: Pulmonary effort is normal. No respira

## 2021-08-07 NOTE — Patient Instructions (Signed)
Please monitor blood pressure at home and keep a  log. ?

## 2021-08-07 NOTE — Addendum Note (Signed)
Addended by: [name] on: 08/07/2021 03:43 PM ? ? Modules accepted: Orders ? ?

## 2021-08-16 ENCOUNTER — Ambulatory Visit: Payer: Medicare HMO | Admitting: Family Medicine

## 2021-08-23 ENCOUNTER — Encounter: Payer: Medicare HMO | Admitting: Family Medicine

## 2021-08-23 ENCOUNTER — Encounter: Payer: Self-pay | Admitting: Family Medicine

## 2021-08-24 ENCOUNTER — Ambulatory Visit: Payer: Medicare HMO | Admitting: Family Medicine

## 2021-08-26 NOTE — Progress Notes (Signed)
Appointment canceled.

## 2021-08-27 ENCOUNTER — Encounter: Payer: Self-pay | Admitting: Family Medicine

## 2021-08-30 ENCOUNTER — Other Ambulatory Visit: Payer: Self-pay | Admitting: Family Medicine

## 2021-08-30 DIAGNOSIS — E785 Hyperlipidemia, unspecified: Secondary | ICD-10-CM

## 2021-08-30 DIAGNOSIS — I10 Essential (primary) hypertension: Secondary | ICD-10-CM

## 2021-09-07 ENCOUNTER — Ambulatory Visit: Payer: Medicare HMO

## 2021-09-19 ENCOUNTER — Telehealth: Payer: Self-pay | Admitting: Family Medicine

## 2021-09-19 NOTE — Telephone Encounter (Signed)
Pt hasn't had PPD. Offered to schedule with Triage nurse but son states he will call back to schedule.

## 2021-11-15 ENCOUNTER — Telehealth: Payer: Self-pay | Admitting: Family Medicine

## 2021-11-15 NOTE — Telephone Encounter (Signed)
No answer unable to leave a message for patient to call back and schedule Medicare Annual Wellness Visit (AWV) to be completed by video or phone.   Last AWV: 09/06/2020  Please schedule at anytime with [facility]     45 minute appointment  Any questions, please contact me at [phone]

## 2021-11-29 ENCOUNTER — Ambulatory Visit: Payer: Medicare HMO | Admitting: "Endocrinology

## 2021-12-13 ENCOUNTER — Inpatient Hospital Stay (HOSPITAL_COMMUNITY)
Admission: EM | Admit: 2021-12-13 | Discharge: 2021-12-17 | DRG: 682 | Disposition: A | Discharge status: Hospice | Payer: Medicare Other | Attending: Internal Medicine | Admitting: Internal Medicine

## 2021-12-13 ENCOUNTER — Emergency Department (HOSPITAL_COMMUNITY): Payer: Medicare Other

## 2021-12-13 ENCOUNTER — Other Ambulatory Visit: Payer: Self-pay

## 2021-12-13 ENCOUNTER — Encounter (HOSPITAL_COMMUNITY): Payer: Self-pay | Admitting: Emergency Medicine

## 2021-12-13 DIAGNOSIS — I129 Hypertensive chronic kidney disease with stage 1 through stage 4 chronic kidney disease, or unspecified chronic kidney disease: Secondary | ICD-10-CM | POA: Diagnosis present

## 2021-12-13 DIAGNOSIS — Z79890 Hormone replacement therapy (postmenopausal): Secondary | ICD-10-CM

## 2021-12-13 DIAGNOSIS — Z951 Presence of aortocoronary bypass graft: Secondary | ICD-10-CM

## 2021-12-13 DIAGNOSIS — N179 Acute kidney failure, unspecified: Secondary | ICD-10-CM | POA: Diagnosis not present

## 2021-12-13 DIAGNOSIS — E039 Hypothyroidism, unspecified: Secondary | ICD-10-CM | POA: Diagnosis present

## 2021-12-13 DIAGNOSIS — E875 Hyperkalemia: Secondary | ICD-10-CM | POA: Diagnosis present

## 2021-12-13 DIAGNOSIS — G9341 Metabolic encephalopathy: Secondary | ICD-10-CM | POA: Diagnosis present

## 2021-12-13 DIAGNOSIS — N184 Chronic kidney disease, stage 4 (severe): Secondary | ICD-10-CM | POA: Diagnosis present

## 2021-12-13 DIAGNOSIS — Z905 Acquired absence of kidney: Secondary | ICD-10-CM

## 2021-12-13 DIAGNOSIS — D649 Anemia, unspecified: Secondary | ICD-10-CM

## 2021-12-13 DIAGNOSIS — I252 Old myocardial infarction: Secondary | ICD-10-CM

## 2021-12-13 DIAGNOSIS — F0390 Unspecified dementia without behavioral disturbance: Secondary | ICD-10-CM | POA: Diagnosis not present

## 2021-12-13 DIAGNOSIS — E860 Dehydration: Secondary | ICD-10-CM | POA: Diagnosis present

## 2021-12-13 DIAGNOSIS — Z66 Do not resuscitate: Secondary | ICD-10-CM | POA: Diagnosis present

## 2021-12-13 DIAGNOSIS — D631 Anemia in chronic kidney disease: Secondary | ICD-10-CM | POA: Diagnosis present

## 2021-12-13 DIAGNOSIS — Z90710 Acquired absence of both cervix and uterus: Secondary | ICD-10-CM

## 2021-12-13 DIAGNOSIS — I10 Essential (primary) hypertension: Secondary | ICD-10-CM | POA: Diagnosis present

## 2021-12-13 DIAGNOSIS — Z85528 Personal history of other malignant neoplasm of kidney: Secondary | ICD-10-CM

## 2021-12-13 DIAGNOSIS — Z79899 Other long term (current) drug therapy: Secondary | ICD-10-CM

## 2021-12-13 DIAGNOSIS — Z20822 Contact with and (suspected) exposure to covid-19: Secondary | ICD-10-CM | POA: Diagnosis present

## 2021-12-13 DIAGNOSIS — Z9981 Dependence on supplemental oxygen: Secondary | ICD-10-CM

## 2021-12-13 DIAGNOSIS — I2510 Atherosclerotic heart disease of native coronary artery without angina pectoris: Secondary | ICD-10-CM | POA: Diagnosis present

## 2021-12-13 DIAGNOSIS — D62 Acute posthemorrhagic anemia: Secondary | ICD-10-CM | POA: Diagnosis present

## 2021-12-13 DIAGNOSIS — C649 Malignant neoplasm of unspecified kidney, except renal pelvis: Secondary | ICD-10-CM | POA: Diagnosis present

## 2021-12-13 DIAGNOSIS — Z8249 Family history of ischemic heart disease and other diseases of the circulatory system: Secondary | ICD-10-CM

## 2021-12-13 DIAGNOSIS — Z9861 Coronary angioplasty status: Secondary | ICD-10-CM

## 2021-12-13 DIAGNOSIS — E785 Hyperlipidemia, unspecified: Secondary | ICD-10-CM | POA: Diagnosis present

## 2021-12-13 DIAGNOSIS — R4182 Altered mental status, unspecified: Secondary | ICD-10-CM

## 2021-12-13 DIAGNOSIS — E1122 Type 2 diabetes mellitus with diabetic chronic kidney disease: Secondary | ICD-10-CM | POA: Diagnosis present

## 2021-12-13 DIAGNOSIS — E872 Acidosis, unspecified: Secondary | ICD-10-CM | POA: Diagnosis not present

## 2021-12-13 DIAGNOSIS — Z515 Encounter for palliative care: Secondary | ICD-10-CM

## 2021-12-13 LAB — CBC WITH DIFFERENTIAL/PLATELET
Abs Immature Granulocytes: 0.03 10*3/uL (ref 0.00–0.07)
Basophils Absolute: 0 10*3/uL (ref 0.0–0.1)
Basophils Relative: 0 %
Eosinophils Absolute: 0.1 10*3/uL (ref 0.0–0.5)
Eosinophils Relative: 2 %
HCT: 18.1 % — ABNORMAL LOW (ref 36.0–46.0)
Hemoglobin: 5.4 g/dL — CL (ref 12.0–15.0)
Immature Granulocytes: 1 %
Lymphocytes Relative: 12 %
Lymphs Abs: 0.6 10*3/uL — ABNORMAL LOW (ref 0.7–4.0)
MCH: 31.4 pg (ref 26.0–34.0)
MCHC: 29.8 g/dL — ABNORMAL LOW (ref 30.0–36.0)
MCV: 105.2 fL — ABNORMAL HIGH (ref 80.0–100.0)
Monocytes Absolute: 0.5 10*3/uL (ref 0.1–1.0)
Monocytes Relative: 9 %
Neutro Abs: 3.8 10*3/uL (ref 1.7–7.7)
Neutrophils Relative %: 76 %
Platelets: 161 10*3/uL (ref 150–400)
RBC: 1.72 MIL/uL — ABNORMAL LOW (ref 3.87–5.11)
RDW: 19.2 % — ABNORMAL HIGH (ref 11.5–15.5)
WBC: 5.1 10*3/uL (ref 4.0–10.5)
nRBC: 0 % (ref 0.0–0.2)

## 2021-12-13 LAB — URINALYSIS, ROUTINE W REFLEX MICROSCOPIC
Bacteria, UA: NONE SEEN
Bilirubin Urine: NEGATIVE
Glucose, UA: NEGATIVE mg/dL
Hgb urine dipstick: NEGATIVE
Ketones, ur: NEGATIVE mg/dL
Leukocytes,Ua: NEGATIVE
Nitrite: NEGATIVE
Protein, ur: >=300 mg/dL — AB
Specific Gravity, Urine: 1.015 (ref 1.005–1.030)
pH: 7 (ref 5.0–8.0)

## 2021-12-13 LAB — RETICULOCYTES
Immature Retic Fract: 21 % — ABNORMAL HIGH (ref 2.3–15.9)
RBC.: 1.72 MIL/uL — ABNORMAL LOW (ref 3.87–5.11)
Retic Count, Absolute: 65.5 10*3/uL (ref 19.0–186.0)
Retic Ct Pct: 3.8 % — ABNORMAL HIGH (ref 0.4–3.1)

## 2021-12-13 LAB — COMPREHENSIVE METABOLIC PANEL
ALT: 31 U/L (ref 0–44)
AST: 37 U/L (ref 15–41)
Albumin: 2.9 g/dL — ABNORMAL LOW (ref 3.5–5.0)
Alkaline Phosphatase: 104 U/L (ref 38–126)
Anion gap: 6 (ref 5–15)
BUN: 77 mg/dL — ABNORMAL HIGH (ref 8–23)
CO2: 19 mmol/L — ABNORMAL LOW (ref 22–32)
Calcium: 7.4 mg/dL — ABNORMAL LOW (ref 8.9–10.3)
Chloride: 119 mmol/L — ABNORMAL HIGH (ref 98–111)
Creatinine, Ser: 8.28 mg/dL — ABNORMAL HIGH (ref 0.44–1.00)
GFR, Estimated: 5 mL/min — ABNORMAL LOW (ref >60)
Glucose, Bld: 90 mg/dL (ref 70–99)
Potassium: 5.5 mmol/L — ABNORMAL HIGH (ref 3.5–5.1)
Sodium: 144 mmol/L (ref 135–145)
Total Bilirubin: 0.6 mg/dL (ref 0.3–1.2)
Total Protein: 5.9 g/dL — ABNORMAL LOW (ref 6.5–8.1)

## 2021-12-13 LAB — PROTIME-INR
INR: 1.2 (ref 0.8–1.2)
Prothrombin Time: 14.7 seconds (ref 11.4–15.2)

## 2021-12-13 LAB — IRON AND TIBC
Iron: 35 ug/dL (ref 28–170)
Saturation Ratios: 12 % (ref 10.4–31.8)
TIBC: 292 ug/dL (ref 250–450)
UIBC: 257 ug/dL

## 2021-12-13 LAB — PREPARE RBC (CROSSMATCH)

## 2021-12-13 LAB — VITAMIN B12: Vitamin B-12: 1406 pg/mL — ABNORMAL HIGH (ref 180–914)

## 2021-12-13 LAB — APTT: aPTT: 36 seconds (ref 24–36)

## 2021-12-13 LAB — RESP PANEL BY RT-PCR (FLU A&B, COVID) ARPGX2
Influenza A by PCR: NEGATIVE
Influenza B by PCR: NEGATIVE
SARS Coronavirus 2 by RT PCR: NEGATIVE

## 2021-12-13 LAB — FOLATE: Folate: 3.4 ng/mL — ABNORMAL LOW (ref >5.9)

## 2021-12-13 LAB — CBG MONITORING, ED: Glucose-Capillary: 82 mg/dL (ref 70–99)

## 2021-12-13 LAB — FERRITIN: Ferritin: 194 ng/mL (ref 11–307)

## 2021-12-13 LAB — GLUCOSE, CAPILLARY: Glucose-Capillary: 74 mg/dL (ref 70–99)

## 2021-12-13 LAB — POC OCCULT BLOOD, ED: Fecal Occult Bld: POSITIVE — AB

## 2021-12-13 LAB — BRAIN NATRIURETIC PEPTIDE: B Natriuretic Peptide: 790 pg/mL — ABNORMAL HIGH (ref 0.0–100.0)

## 2021-12-13 MED ORDER — SODIUM CHLORIDE 0.45 % IV SOLN: INTRAVENOUS | Status: AC

## 2021-12-13 MED ORDER — POLYETHYLENE GLYCOL 3350 17 G PO PACK: 17.0000 g | PACK | Freq: Every day | ORAL | Status: DC | PRN

## 2021-12-13 MED ORDER — ACETAMINOPHEN 650 MG RE SUPP: 650.0000 mg | Freq: Four times a day (QID) | RECTAL | Status: DC | PRN

## 2021-12-13 MED ORDER — LEVOTHYROXINE SODIUM 75 MCG PO TABS
150.0000 ug | ORAL_TABLET | Freq: Every day | ORAL | Status: DC
  Administered 2021-12-14 – 2021-12-15 (×2): 150 ug via ORAL
  Filled 2021-12-13 (×2): qty 2

## 2021-12-13 MED ORDER — ONDANSETRON HCL 4 MG PO TABS: 4.0000 mg | ORAL_TABLET | Freq: Four times a day (QID) | ORAL | Status: DC | PRN

## 2021-12-13 MED ORDER — PANTOPRAZOLE SODIUM 40 MG IV SOLR: 40.0000 mg | Freq: Once | INTRAVENOUS | Status: DC

## 2021-12-13 MED ORDER — SODIUM CHLORIDE 0.9 % IV SOLN: 10.0000 mL/h | Freq: Once | INTRAVENOUS | Status: DC

## 2021-12-13 MED ORDER — ACETAMINOPHEN 325 MG PO TABS: 650.0000 mg | ORAL_TABLET | Freq: Four times a day (QID) | ORAL | Status: DC | PRN

## 2021-12-13 MED ORDER — ONDANSETRON HCL 4 MG/2ML IJ SOLN: 4.0000 mg | Freq: Four times a day (QID) | INTRAMUSCULAR | Status: DC | PRN

## 2021-12-13 NOTE — ED Notes (Signed)
Pt pulled out IV- blood all over blanket and pt hands/arms- pt cleaned up, placed clean gown on pt, IV restarted by Vivien Rota, RN and blood restarted.

## 2021-12-13 NOTE — Assessment & Plan Note (Addendum)
Multifactorial including acute on chronic renal failure, acute blood loss anemia, volume depletion,  hypothyroidism H43 [id number] Folic acid 5.7>>VJKQASU Check TSH 193.06, Free T4 0.51>>start IV repletion Check ammonia--21 Check VBG 7,29/34/45/16 --remains encephalopathic

## 2021-12-13 NOTE — Assessment & Plan Note (Addendum)
Hgb 5.5.  Baseline about 10-12.  Stool occult positive.  Not on anticoagulation antiplatelet. -2 units PRBC ordered -no signs of active blood loss presently -Check anemia panel>>> B12 1406, folate 3.4 -----iron sat 12, ferritin 194

## 2021-12-13 NOTE — Assessment & Plan Note (Addendum)
Systolic 979 - 536.  Lasix 20 mg daily listed on med list.  Discontinue furosemide

## 2021-12-13 NOTE — Assessment & Plan Note (Addendum)
Cr  8.28, BUN elevated at 77.  Last check 04/16/21-creatinine was 3.3.  Baseline CKD stage IV.  Nursing home resident, question poor oral intake, dehydration.  She does appear dehydrated.  Medication list has Lasix 20 mg daily.  Chest x-ray shows possible interstitial edema. -UA with 300 protein. -Foley catheter -Check BNP -2 u PRBC ordered. - 1/2 N/s 100c/hr x 15hrs -Please consult nephrology in the morning

## 2021-12-13 NOTE — ED Provider Notes (Signed)
[facility] EMERGENCY DEPARTMENT Provider Note   CSN: [id number] Arrival date & time: 12/13/21  1423     History Chief Complaint  Patient presents with   Altered Mental Status    Erin Jordan is a 76 y.o. female patient with history of CKD, dementia, chronic kidney disease, hyperlipidemia who presents to the emergency department today with altered mental status.  Per EMS, patient is nonverbal at baseline from the facility.  She had a fall almost a month ago and was seen evaluated another hospital and had a normal CT head and MRI of the head.  The family came to visit the patient and saw that she was altered and they believe that this is not normal for the patient and they wanted her to be reevaluated.   Altered Mental Status      Home Medications Prior to Admission medications   Medication Sig Start Date End Date Taking? Authorizing Provider  acetaminophen (TYLENOL) 325 MG tablet Take 650 mg by mouth 2 (two) times daily.   Yes [provider]  aspirin EC 81 MG tablet Take 1 tablet (81 mg total) by mouth daily. Swallow whole. 10/13/20  Yes [name], FNP  diphenhydrAMINE (BENADRYL) 25 MG tablet Take 25 mg by mouth every 6 (six) hours as needed (Tongue and facial swelling).   Yes [provider]  furosemide (LASIX) 20 MG tablet Take 20 mg by mouth daily. 11/24/21  Yes [provider]  ipratropium-albuterol (DUONEB) 0.5-2.5 (3) MG/3ML SOLN Take 3 mLs by nebulization in the morning and at bedtime. 11/28/21  Yes [provider]  Morphine Sulfate (MORPHINE CONCENTRATE) 10 mg / 0.5 ml concentrated solution Take 5 mg by mouth every 4 (four) hours as needed for shortness of breath or moderate pain. 12/07/21  Yes [provider]  OXYGEN Inhale 2 L into the lungs daily as needed (shortness of breath).   Yes [provider]  traZODone (DESYREL) 50 MG tablet TAKE (1) TABLET BY MOUTH AT BEDTIME. Patient taking differently: Take 50 mg by  mouth at bedtime. 05/25/21  Yes Hendricks Limes F, FNP  albuterol (VENTOLIN HFA) 108 (90 Base) MCG/ACT inhaler Inhale into the lungs. Patient not taking: Reported on 12/13/2021 11/23/21   [provider]  amLODipine (NORVASC) 10 MG tablet TAKE ONE TABLET BY MOUTH ONCE DAILY. Patient not taking: Reported on 12/13/2021 08/30/21   [name], FNP  atorvastatin (LIPITOR) 80 MG tablet Take 1 tablet (80 mg total) by mouth daily. (NEEDS TO BE SEEN BEFORE NEXT REFILL) Patient not taking: Reported on 12/13/2021 07/31/21   [name], FNP  carvedilol (COREG) 12.5 MG tablet Take 1 tablet (12.5 mg total) by mouth 2 (two) times daily. For BP and Heart Patient not taking: Reported on 12/13/2021 01/11/21   [name], FNP  Cholecalciferol 25 MCG (1000 UT) capsule Take by mouth. Patient not taking: Reported on 12/13/2021 02/08/21 02/08/22  [provider]  donepezil (ARICEPT) 10 MG tablet TAKE (1) TABLET BY MOUTH AT BEDTIME. Patient not taking: Reported on 12/13/2021 05/25/21   [name], FNP  ezetimibe (ZETIA) 10 MG tablet TAKE ONE TABLET BY MOUTH ONCE DAILY. Patient not taking: Reported on 12/13/2021 08/30/21   [name], FNP  levothyroxine (SYNTHROID) 100 MCG tablet Take 1 tablet (100 mcg total) by mouth daily before breakfast. 06/01/21   Cassandria Anger, MD  levothyroxine (SYNTHROID) 150 MCG tablet Take 150 mcg by mouth daily. 11/07/21   [provider]  predniSONE (DELTASONE) 50 MG tablet Take 50 mg by mouth daily. 12/13/21   [provider]  sulfamethoxazole-trimethoprim (BACTRIM DS) 800-160 MG tablet Take 1 tablet by mouth 2 (two) times daily. 11/19/21   [provider]      Allergies    Patient has no allergy information on record.    Review of Systems   Review of Systems  All other systems reviewed and are negative.   Physical Exam Updated Vital Signs BP 102/60 (BP Location: Left Arm)   Pulse (!) 55   Resp 18   SpO2 100%  Physical  Exam Vitals and nursing note reviewed.  Constitutional:      General: She is not in acute distress.    Appearance: Normal appearance.  HENT:     Head: Normocephalic and atraumatic.  Eyes:     General:        Right eye: No discharge.        Left eye: No discharge.  Cardiovascular:     Comments: Regular rate and rhythm.  S1/S2 are distinct without any evidence of murmur, rubs, or gallops.  Radial pulses are 2+ bilaterally.  Dorsalis pedis pulses are 2+ bilaterally.  No evidence of pedal edema. Pulmonary:     Comments: Clear to auscultation bilaterally.  Normal effort.  No respiratory distress.  No evidence of wheezes, rales, or rhonchi heard throughout. Abdominal:     General: Abdomen is flat. Bowel sounds are normal. There is no distension.     Tenderness: There is no abdominal tenderness. There is no guarding or rebound.  Musculoskeletal:        General: Normal range of motion.     Cervical back: Neck supple.  Skin:    General: Skin is warm and dry.     Findings: No rash.  Neurological:     General: No focal deficit present.     Mental Status: She is alert. She is disoriented and confused.     Comments: Nonverbal.  Responds to verbal stimuli period.   Psychiatric:        Mood and Affect: Mood normal.        Behavior: Behavior normal.     ED Results / Procedures / Treatments   Labs (all labs ordered are listed, but only abnormal results are displayed) Labs Reviewed  COMPREHENSIVE METABOLIC PANEL - Abnormal; Notable for the following components:      Result Value   Potassium 5.5 (*)    Chloride 119 (*)    CO2 19 (*)    BUN 77 (*)    Creatinine, Ser 8.28 (*)    Calcium 7.4 (*)    Total Protein 5.9 (*)    Albumin 2.9 (*)    GFR, Estimated 5 (*)    All other components within normal limits  CBC WITH DIFFERENTIAL/PLATELET - Abnormal; Notable for the following components:   RBC 1.72 (*)    Hemoglobin 5.4 (*)    HCT 18.1 (*)    MCV 105.2 (*)    MCHC 29.8 (*)    RDW  19.2 (*)    Lymphs Abs 0.6 (*)    All other components within normal limits  POC OCCULT BLOOD, ED - Abnormal; Notable for the following components:   Fecal Occult Bld POSITIVE (*)    All other components within normal limits  RESP PANEL BY RT-PCR (FLU A&B, COVID) ARPGX2  PROTIME-INR  APTT  URINALYSIS, ROUTINE W REFLEX MICROSCOPIC  CBG MONITORING, ED  POC OCCULT BLOOD, ED  TYPE AND SCREEN  PREPARE RBC (CROSSMATCH)    EKG None  Radiology DG Chest 1 View  Result Date: 12/13/2021 CLINICAL DATA:  Status post fall from bed. EXAM: CHEST  1 VIEW COMPARISON:  Chest radiograph 10/29/2020 FINDINGS: Monitoring leads overlie the patient. Cardiac contours are prominent. Bilateral interstitial pulmonary opacities. Probable small layering left pleural effusion. No definite pneumothorax. IMPRESSION: Cardiomegaly. Possible interstitial edema. Small layering left pleural effusion. Electronically Signed   By: Lovey Newcomer M.D.   On: 12/13/2021 17:41   CT Head Wo Contrast  Result Date: 12/13/2021 CLINICAL DATA:  Altered mental status.  Patient is status post fall. EXAM: CT HEAD WITHOUT CONTRAST TECHNIQUE: Contiguous axial images were obtained from the base of the skull through the vertex without intravenous contrast. RADIATION DOSE REDUCTION: This exam was performed according to the departmental dose-optimization program which includes automated exposure control, adjustment of the mA and/or kV according to patient size and/or use of iterative reconstruction technique. COMPARISON:  Brain CT 01/08/2021 FINDINGS: Brain: Ventricles and sulci are prominent. Periventricular and subcortical white matter hypodensities compatible with chronic microvascular ischemic changes. No evidence for acute cortically based infarct, intracranial hemorrhage, mass lesion or mass-effect. Vascular: Unremarkable Skull: Intact. Sinuses/Orbits: Paranasal sinuses are well aerated. Mastoid air cells are unremarkable. Other: Soft tissue  swelling overlying the left frontal calvarium. IMPRESSION: No acute intracranial process. Soft tissue swelling overlying the left frontal calvarium. Electronically Signed   By: Lovey Newcomer M.D.   On: 12/13/2021 17:38    Procedures .Critical Care  Performed by: Hendricks Limes, PA-C Authorized by: Hendricks Limes, PA-C   Critical care provider statement:    Critical care time (minutes):  60   Critical care time was exclusive of:  Separately billable procedures and treating other patients   Critical care was necessary to treat or prevent imminent or life-threatening deterioration of the following conditions:  Renal failure and circulatory failure   Critical care was time spent personally by me on the following activities:  Blood draw for specimens, development of treatment plan with patient or surrogate, discussions with consultants, ordering and performing treatments and interventions, ordering and review of laboratory studies, ordering and review of radiographic studies, pulse oximetry, re-evaluation of patient's condition and review of old charts     Medications Ordered in ED Medications  0.9 %  sodium chloride infusion (has no administration in time range)    ED Course/ Medical Decision Making/ A&P Clinical Course as of 12/13/21 1833  Thu Dec 13, 2021  1613 Patient's work-up remarkable for new anemia hemoglobin 5.4 macrocytic in nature with no melena on exam but positive Hemoccult.  Ordered for Protonix and transfusion.  She is also AKI creatinine 8.28 from baseline 3 with elevated BUN 77 which could be related to to GI bleed or prerenal injury or multifactorial in nature.  Potassium 5.5.  Ordered for indwelling Foley.  She will require admission for further work-up and management [VB]  1624 CBC with Differential(!!) Notable for significant anemia. [CF]  1625 Comprehensive metabolic panel(!) Significant hyperkalemia in comparison to previous.  Also evidence of hyperchloremia.   Creatinine of 8 which is up from 3.  Significant AKI. [CF]  1653 I spoke with the son [name] at the bedside regarding the patient's condition.  He is healthcare power of attorney.  We discussed the DNR at the patient arrived with.  [name] would like all measures to be done including blood products and treating her significant AKI. [CF]  1745 POC occult blood,  ED(!) Positive. [CF]  1745 Type and screen [facility] B positive [CF]  3570 CT Head Wo Contrast I personally ordered and interpreted a CT head without contrast which does not reveal any significant abnormalities.  I do agree with radiology interpretation. [CF]  1779 DG Chest 1 View I personally ordered and interpreted chest x-ray which does not reveal any signs of pneumonia.  I do agree with the radiologist interpretation. [CF]    Clinical Course User Index [CF] Hendricks Limes, PA-C [VB] Elgie Congo, MD                           Medical Decision Making ANDRIANNA MANALANG is a 76 y.o. female patient who presents to the emergency department today for further evaluation of worsening altered mental status in her baseline.  We will get basic labs, CT imaging in addition to chest x-ray to rule out reversible causes.  Per EMS, this seems to be at the patient's baseline but is difficult to fully assess as she is nonverbal and does not communicate well.  Given the findings on labs with the significant microcytic anemia, acute renal failure, and increasing altered mental status I do feel the patient would benefit from further evaluation in the hospital.  She has blood ordered, Foley catheter in place, and plan to get her admitted to the hospitalist service.  Due to shift change, Amjad PA-C will take the call for hospitalist admission.   Amount and/or Complexity of Data Reviewed Labs: ordered. Decision-making details documented in ED Course. Radiology: ordered. Decision-making details documented in ED Course.  Risk Decision  regarding hospitalization.   Final Clinical Impression(s) / ED Diagnoses Final diagnoses:  Acute renal failure, unspecified acute renal failure type ([place])  Anemia, unspecified type  Altered mental status, unspecified altered mental status type    Rx / DC Orders ED Discharge Orders     None         Cherrie Gauze 12/13/21 1857    Elgie Congo, MD 12/14/21 [phone]

## 2021-12-13 NOTE — ED Triage Notes (Signed)
Pt arrived via [facility] from [place] with c/o a fall out of bed that happened 11/20/21 and AMS. Per EMS pt was taken to [facility] after fall to be checked out. According to EMS, [facility] states that it is normal for pt to be mostly non-verbal; However, EMS states family states that pt being non-verbal is abnormal. Pt has a knot on L side of head. Vitals WDL

## 2021-12-13 NOTE — H&P (Signed)
History and Physical    Erin Jordan FTD:[id number] DOB: 1946/02/22 DOA: 12/13/2021  PCP: [name], FNP  Patient coming from: HOME   I have personally briefly reviewed patient's old medical records in [place]  Chief Complaint:   HPI: Erin Jordan is a 76 y.o. female with medical history significant for CABG 2006 PCI 2019, dementia, CKD 4, hypertension, diabetes mellitus.  Renal cell carcinoma.  Patient was brought to the ED from nursing home reports of change in mental status.  Per nursing home, patient is nonverbal over the past month.  Family denies this.  Family came to check on patient today and requested patient be evaluated in the hospital as she was altered.  Per nursing home, patient had a fall out of bed 8/15, was evaluated for this, with head CT and MRI [place] which were Normal.  ED provider talked to patient's on NS was at bedside, he is HCPOA. Time of my evaluation, patient is awake, attempting to answer questions, but speech is mumbled.  ED Course: Stable vitals.  Creatinine elevated 8.28.  Hemoglobin low at 5.4.  UA not suggestive of UTI, shows greater than 300 protein.  Head CT unremarkable.  Chest x-ray shows possible interstitial edema. Units PRBC ordered for transfusion.  Hospitalist to admit.  Review of Systems: Unable to ascertain due to patient's  dementia.  Past Medical History:  Diagnosis Date   Arthritis    Cancer of kidney ([place])    CKD (chronic kidney disease), stage IV ([place])    Coronary atherosclerosis of native coronary artery 2006   Multivessel status post CABG in [place], graft disease documented March 2019 with DES to SVG to diagonal   Essential hypertension    Free monoclonal light chain 04/14/2012   History of diabetes mellitus, type II    Hyperlipidemia    Hypothyroidism    NSTEMI (non-ST elevated myocardial infarction) ([place]) 06/24/2017   Pneumonia    hx of 2021    Renal hematoma    Right renal mass     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    Past Surgical History:  Procedure Laterality Date   ABDOMINAL HYSTERECTOMY     CORONARY ARTERY BYPASS GRAFT  2006   [place], [place] N/A 06/25/2017   Procedure: CORONARY STENT INTERVENTION;  Surgeon: Jettie Booze, MD;  Location: [facility];  Service: Cardiovascular;  Laterality: N/A;   IR RADIOLOGIST EVAL & MGMT  07/08/2019   LEFT HEART CATH AND CORS/GRAFTS ANGIOGRAPHY N/A 06/25/2017   Procedure: LEFT HEART CATH AND CORS/GRAFTS ANGIOGRAPHY;  Surgeon: Jettie Booze, MD;  Location: [facility];  Service: Cardiovascular;  Laterality: N/A;   THYROIDECTOMY N/A 07/28/2020   Procedure: TOTAL THYROIDECTOMY;  Surgeon: Armandina Gemma, MD;  Location: [facility];  Service: General;  Laterality: N/A;  2 HOURS     reports that she has never smoked. She has never used smokeless tobacco. She reports that she does not drink alcohol and does not use drugs.  Not on File  Family History  Problem Relation Age of Onset   Heart attack Mother    Hypertension Mother    Seizures Son    Seizures Son    Seizures Daughter    Prior to Admission medications   Medication Sig Start Date End Date Taking? Authorizing Provider  acetaminophen (TYLENOL) 325 MG tablet Take 650 mg by mouth 2 (two) times daily.   Yes [provider]  diphenhydrAMINE (  BENADRYL) 25 MG tablet Take 25 mg by mouth every 6 (six) hours as needed (Tongue and facial swelling).   Yes [provider]  furosemide (LASIX) 20 MG tablet Take 20 mg by mouth daily. 11/24/21  Yes [provider]  ipratropium-albuterol (DUONEB) 0.5-2.5 (3) MG/3ML SOLN Take 3 mLs by nebulization in the morning and at bedtime. 11/28/21  Yes [provider]  levothyroxine (SYNTHROID) 150 MCG tablet Take 150 mcg by mouth daily. 11/07/21  Yes [provider]  Morphine Sulfate (MORPHINE CONCENTRATE) 10 mg / 0.5 ml concentrated solution Take 5 mg by  mouth every 4 (four) hours as needed for shortness of breath or moderate pain. 12/07/21  Yes [provider]  OXYGEN Inhale 2 L into the lungs daily as needed (shortness of breath).   Yes [provider]  traZODone (DESYREL) 50 MG tablet TAKE (1) TABLET BY MOUTH AT BEDTIME. Patient taking differently: Take 50 mg by mouth at bedtime. 05/25/21  Yes Hendricks Limes F, FNP  albuterol (VENTOLIN HFA) 108 (90 Base) MCG/ACT inhaler Inhale into the lungs. Patient not taking: Reported on 12/13/2021 11/23/21   [provider]  predniSONE (DELTASONE) 50 MG tablet Take 50 mg by mouth daily. Patient not taking: Reported on 12/13/2021 12/13/21   [provider]    Physical Exam: Limited exam due to patient's dementia Vitals:   12/13/21 1900 12/13/21 1915 12/13/21 1930 12/13/21 1950  BP: (!) 128/50 125/65 112/76 132/78  Pulse: 69 (!) 59 67 64  Resp: '20 12 14 17  '$ Temp:      TempSrc:      SpO2: 97% (!) 88% 97% 100%    Constitutional: NAD, calm, comfortable Vitals:   12/13/21 1900 12/13/21 1915 12/13/21 1930 12/13/21 1950  BP: (!) 128/50 125/65 112/76 132/78  Pulse: 69 (!) 59 67 64  Resp: '20 12 14 17  '$ Temp:      TempSrc:      SpO2: 97% (!) 88% 97% 100%   Eyes: PERRL, lids and conjunctivae normal ENMT: Mucous membranes appear dry. Neck: normal, supple, no masses, no thyromegaly Respiratory: clear to auscultation bilaterally, no wheezing, no crackles. Normal respiratory effort. Cardiovascular: Regular rate and rhythm, no murmurs / rubs / gallops. No extremity edema.  Abdomen: no tenderness, no masses palpated. No hepatosplenomegaly. Bowel sounds positive.  Musculoskeletal: no clubbing / cyanosis. No joint deformity upper and lower extremities.  Skin: no rashes, lesions, ulcers. No induration Neurologic: No facial asymmetry, mumbling speech, not following directions, some movement of lower extremities to stimulation. Psychiatric: Unable to assess.   Labs on Admission: I  have personally reviewed following labs and imaging studies  CBC: Recent Labs  Lab 12/13/21 1526  WBC 5.1  NEUTROABS 3.8  HGB 5.4*  HCT 18.1*  MCV 105.2*  PLT 638   Basic Metabolic Panel: Recent Labs  Lab 12/13/21 1526  NA 144  K 5.5*  CL 119*  CO2 19*  GLUCOSE 90  BUN 77*  CREATININE 8.28*  CALCIUM 7.4*   GFR: CrCl cannot be calculated (Unknown ideal weight.). Liver Function Tests: Recent Labs  Lab 12/13/21 1526  AST 37  ALT 31  ALKPHOS 104  BILITOT 0.6  PROT 5.9*  ALBUMIN 2.9*   Coagulation Profile: Recent Labs  Lab 12/13/21 1602  INR 1.2   CBG: Recent Labs  Lab 12/13/21 1452  GLUCAP 82   Urine analysis:    Component Value Date/Time   COLORURINE YELLOW 12/13/2021 1630   APPEARANCEUR CLEAR 12/13/2021 1630  APPEARANCEUR Hazy (A) 01/09/2021 1634   LABSPEC 1.015 12/13/2021 1630   PHURINE 7.0 12/13/2021 1630   GLUCOSEU NEGATIVE 12/13/2021 1630   HGBUR NEGATIVE 12/13/2021 1630   BILIRUBINUR NEGATIVE 12/13/2021 1630   BILIRUBINUR Negative 01/09/2021 1634   KETONESUR NEGATIVE 12/13/2021 1630   PROTEINUR >=300 (A) 12/13/2021 1630   UROBILINOGEN 0.2 05/19/2019 0947   UROBILINOGEN 0.2 06/01/2011 1822   NITRITE NEGATIVE 12/13/2021 1630   LEUKOCYTESUR NEGATIVE 12/13/2021 1630    Radiological Exams on Admission: DG Chest 1 View  Result Date: 12/13/2021 CLINICAL DATA:  Status post fall from bed. EXAM: CHEST  1 VIEW COMPARISON:  Chest radiograph 10/29/2020 FINDINGS: Monitoring leads overlie the patient. Cardiac contours are prominent. Bilateral interstitial pulmonary opacities. Probable small layering left pleural effusion. No definite pneumothorax. IMPRESSION: Cardiomegaly. Possible interstitial edema. Small layering left pleural effusion. Electronically Signed   By: Lovey Newcomer M.D.   On: 12/13/2021 17:41   CT Head Wo Contrast  Result Date: 12/13/2021 CLINICAL DATA:  Altered mental status.  Patient is status post fall. EXAM: CT HEAD WITHOUT CONTRAST  TECHNIQUE: Contiguous axial images were obtained from the base of the skull through the vertex without intravenous contrast. RADIATION DOSE REDUCTION: This exam was performed according to the departmental dose-optimization program which includes automated exposure control, adjustment of the mA and/or kV according to patient size and/or use of iterative reconstruction technique. COMPARISON:  Brain CT 01/08/2021 FINDINGS: Brain: Ventricles and sulci are prominent. Periventricular and subcortical white matter hypodensities compatible with chronic microvascular ischemic changes. No evidence for acute cortically based infarct, intracranial hemorrhage, mass lesion or mass-effect. Vascular: Unremarkable Skull: Intact. Sinuses/Orbits: Paranasal sinuses are well aerated. Mastoid air cells are unremarkable. Other: Soft tissue swelling overlying the left frontal calvarium. IMPRESSION: No acute intracranial process. Soft tissue swelling overlying the left frontal calvarium. Electronically Signed   By: Lovey Newcomer M.D.   On: 12/13/2021 17:38    EKG: Pending   Assessment/Plan Principal Problem:   Acute on chronic kidney failure [facility]) Active Problems:   CKD (chronic kidney disease), stage IV ([facility])   Anemia   Essential hypertension   CAD -S/P CABG in 2006 AND  PCI 06/26/17   Dementia without behavioral disturbance ([facility])   Renal cell carcinoma ([facility])   Assessment and Plan: * Acute on chronic kidney failure ([facility]) Cr  8.28, BUN elevated at 77.  Last check 04/16/21-creatinine was 3.3.  Baseline CKD stage IV.  Nursing home resident, question poor oral intake, dehydration.  She does appear dehydrated.  Medication list has Lasix 20 mg daily.  Chest x-ray shows possible interstitial edema. -UA with 300 protein. -Foley catheter -Check BNP -2 u PRBC ordered. - 1/2 N/s 100c/hr x 15hrs -Please consult nephrology in the morning  Anemia Hgb 5.5.  Baseline about 10-12.  Stool occult positive.  Not on anticoagulation  antiplatelet. -2 units PRBC ordered -Check anemia panel -EDP talked to GI, GI to see in a.m, recommended nephrology evaluation.  Acute metabolic encephalopathy Per nursing home, patient is nonverbal at baseline.  Reports son- [name] is HCPOA reports patient is altered from her baseline.  Head CT unremarkable.  Vital stable.  Afebrile without leukocytosis.  Secondary to AKI on CKD. -Hydrate for now  Essential hypertension Systolic 563 - 149.  Lasix 20 mg daily listed on med list.     DVT prophylaxis: SCDS for now, pending GI bleed rule out Code Status: DNR-dnr form at bedside. Family Communication: None at bedside.  Son [name] is HCPOA Disposition Plan: >  2 days Consults called: GI, please consult nephrology in the morning. Admission status: inpt, tele I certify that at the point of admission it is my clinical judgment that the patient will require inpatient hospital care spanning beyond 2 midnights from the point of admission due to high intensity of service, high risk for further deterioration and high frequency of surveillance required.   Author: Bethena Roys, MD 12/13/2021 10:24 PM  For on call review [email or web address].

## 2021-12-13 NOTE — ED Provider Notes (Signed)
Signout received on this 76 year old female.  Please see note from [facility] for full details.  Essentially this is a 76 year old female with history of dementia coming in with altered mental status.  Work-up reveals acute renal failure, and new anemia with concern for GI bleed given positive Hemoccult.  Patient at the time of signout is awaiting hospitalist callback for admission. Physical Exam  BP (!) 140/96   Pulse 61   Temp 97.8 F (36.6 C) (Temporal)   Resp 17   SpO2 97%     Procedures  Procedures  ED Course / MDM   Clinical Course as of 12/13/21 1916  Thu Dec 13, 2021  1613 Patient's work-up remarkable for new anemia hemoglobin 5.4 macrocytic in nature with no melena on exam but positive Hemoccult.  Ordered for Protonix and transfusion.  She is also AKI creatinine 8.28 from baseline 3 with elevated BUN 77 which could be related to to GI bleed or prerenal injury or multifactorial in nature.  Potassium 5.5.  Ordered for indwelling Foley.  She will require admission for further work-up and management [VB]  1624 CBC with Differential(!!) Notable for significant anemia. [CF]  1625 Comprehensive metabolic panel(!) Significant hyperkalemia in comparison to previous.  Also evidence of hyperchloremia.  Creatinine of 8 which is up from 3.  Significant AKI. [CF]  1653 I spoke with the son [name] at the bedside regarding the patient's condition.  He is healthcare power of attorney.  We discussed the DNR at the patient arrived with.  [name] would like all measures to be done including blood products and treating her significant AKI. [CF]  1745 POC occult blood, ED(!) Positive. [CF]  1745 Type and screen [facility] B positive [CF]  5300 CT Head Wo Contrast I personally ordered and interpreted a CT head without contrast which does not reveal any significant abnormalities.  I do agree with radiology interpretation. [CF]  5110 DG Chest 1 View I personally ordered and interpreted chest  x-ray which does not reveal any signs of pneumonia.  I do agree with the radiologist interpretation. [CF]    Clinical Course User Index [CF] Hendricks Limes, PA-C [VB] Elgie Congo, MD   Medical Decision Making Amount and/or Complexity of Data Reviewed Labs: ordered. Decision-making details documented in ED Course. Radiology: ordered. Decision-making details documented in ED Course.  Risk Prescription drug management. Decision regarding hospitalization.   While awaiting hospitalist, will page gastroenterology to make them aware of this patient will be admitted for GI bleed.  Case discussed with hospitalist will evaluate patient for admission.  Case discussed with gastroenterologist who recommends transfusion of 2 units of blood for now, and getting nephrology on board.  We will notify hospitalist.        Evlyn Courier, PA-C 12/13/21 2354    Elgie Congo, MD 12/14/21 [phone]

## 2021-12-13 NOTE — ED Notes (Signed)
Mits placed on pt due to pt being confused and pulling out IV and monitoring equipment.

## 2021-12-13 NOTE — ED Notes (Signed)
POC occult blood is positive--PA-C made aware

## 2021-12-14 ENCOUNTER — Observation Stay (HOSPITAL_COMMUNITY): Payer: Medicare Other

## 2021-12-14 DIAGNOSIS — G9341 Metabolic encephalopathy: Secondary | ICD-10-CM | POA: Diagnosis present

## 2021-12-14 DIAGNOSIS — N179 Acute kidney failure, unspecified: Principal | ICD-10-CM

## 2021-12-14 DIAGNOSIS — I129 Hypertensive chronic kidney disease with stage 1 through stage 4 chronic kidney disease, or unspecified chronic kidney disease: Secondary | ICD-10-CM | POA: Diagnosis present

## 2021-12-14 DIAGNOSIS — E039 Hypothyroidism, unspecified: Secondary | ICD-10-CM | POA: Diagnosis present

## 2021-12-14 DIAGNOSIS — I252 Old myocardial infarction: Secondary | ICD-10-CM | POA: Diagnosis not present

## 2021-12-14 DIAGNOSIS — I2510 Atherosclerotic heart disease of native coronary artery without angina pectoris: Secondary | ICD-10-CM | POA: Diagnosis present

## 2021-12-14 DIAGNOSIS — E875 Hyperkalemia: Secondary | ICD-10-CM

## 2021-12-14 DIAGNOSIS — Z90710 Acquired absence of both cervix and uterus: Secondary | ICD-10-CM | POA: Diagnosis not present

## 2021-12-14 DIAGNOSIS — Z66 Do not resuscitate: Secondary | ICD-10-CM | POA: Diagnosis present

## 2021-12-14 DIAGNOSIS — F0390 Unspecified dementia without behavioral disturbance: Secondary | ICD-10-CM

## 2021-12-14 DIAGNOSIS — N184 Chronic kidney disease, stage 4 (severe): Secondary | ICD-10-CM

## 2021-12-14 DIAGNOSIS — E1122 Type 2 diabetes mellitus with diabetic chronic kidney disease: Secondary | ICD-10-CM | POA: Diagnosis present

## 2021-12-14 DIAGNOSIS — Z515 Encounter for palliative care: Secondary | ICD-10-CM | POA: Diagnosis not present

## 2021-12-14 DIAGNOSIS — Z951 Presence of aortocoronary bypass graft: Secondary | ICD-10-CM | POA: Diagnosis not present

## 2021-12-14 DIAGNOSIS — E860 Dehydration: Secondary | ICD-10-CM | POA: Diagnosis present

## 2021-12-14 DIAGNOSIS — E785 Hyperlipidemia, unspecified: Secondary | ICD-10-CM | POA: Diagnosis present

## 2021-12-14 DIAGNOSIS — Z85528 Personal history of other malignant neoplasm of kidney: Secondary | ICD-10-CM | POA: Diagnosis not present

## 2021-12-14 DIAGNOSIS — Z79899 Other long term (current) drug therapy: Secondary | ICD-10-CM | POA: Diagnosis not present

## 2021-12-14 DIAGNOSIS — Z905 Acquired absence of kidney: Secondary | ICD-10-CM | POA: Diagnosis not present

## 2021-12-14 DIAGNOSIS — D62 Acute posthemorrhagic anemia: Secondary | ICD-10-CM | POA: Diagnosis present

## 2021-12-14 DIAGNOSIS — Z8249 Family history of ischemic heart disease and other diseases of the circulatory system: Secondary | ICD-10-CM | POA: Diagnosis not present

## 2021-12-14 DIAGNOSIS — D631 Anemia in chronic kidney disease: Secondary | ICD-10-CM | POA: Diagnosis present

## 2021-12-14 DIAGNOSIS — Z20822 Contact with and (suspected) exposure to covid-19: Secondary | ICD-10-CM | POA: Diagnosis present

## 2021-12-14 DIAGNOSIS — E872 Acidosis, unspecified: Secondary | ICD-10-CM | POA: Diagnosis not present

## 2021-12-14 LAB — BASIC METABOLIC PANEL
Anion gap: 8 (ref 5–15)
BUN: 76 mg/dL — ABNORMAL HIGH (ref 8–23)
CO2: 20 mmol/L — ABNORMAL LOW (ref 22–32)
Calcium: 7.7 mg/dL — ABNORMAL LOW (ref 8.9–10.3)
Chloride: 117 mmol/L — ABNORMAL HIGH (ref 98–111)
Creatinine, Ser: 7.98 mg/dL — ABNORMAL HIGH (ref 0.44–1.00)
GFR, Estimated: 5 mL/min — ABNORMAL LOW (ref >60)
Glucose, Bld: 73 mg/dL (ref 70–99)
Potassium: 5.5 mmol/L — ABNORMAL HIGH (ref 3.5–5.1)
Sodium: 145 mmol/L (ref 135–145)

## 2021-12-14 LAB — TYPE AND SCREEN
ABO/RH(D): B POS
Antibody Screen: NEGATIVE
Unit division: 0
Unit division: 0

## 2021-12-14 LAB — BPAM RBC
Blood Product Expiration Date: 202310032359
Blood Product Expiration Date: 202310042359
ISSUE DATE / TIME: 202309071808
ISSUE DATE / TIME: 202309072333
Unit Type and Rh: 1700
Unit Type and Rh: 1700

## 2021-12-14 LAB — PROCALCITONIN: Procalcitonin: 0.14 ng/mL

## 2021-12-14 LAB — BLOOD GAS, VENOUS
Acid-base deficit: 9.4 mmol/L — ABNORMAL HIGH (ref 0.0–2.0)
Bicarbonate: 16.4 mmol/L — ABNORMAL LOW (ref 20.0–28.0)
Drawn by: 27160
O2 Saturation: 74.6 %
Patient temperature: 36.2
pCO2, Ven: 34 mmHg — ABNORMAL LOW (ref 44–60)
pH, Ven: 7.29 (ref 7.25–7.43)
pO2, Ven: 45 mmHg (ref 32–45)

## 2021-12-14 LAB — T4, FREE: Free T4: 0.51 ng/dL — ABNORMAL LOW (ref 0.61–1.12)

## 2021-12-14 LAB — TSH: TSH: 193.068 u[IU]/mL — ABNORMAL HIGH (ref 0.350–4.500)

## 2021-12-14 LAB — CBC
HCT: 29.6 % — ABNORMAL LOW (ref 36.0–46.0)
Hemoglobin: 9.1 g/dL — ABNORMAL LOW (ref 12.0–15.0)
MCH: 30.6 pg (ref 26.0–34.0)
MCHC: 30.7 g/dL (ref 30.0–36.0)
MCV: 99.7 fL (ref 80.0–100.0)
Platelets: 191 10*3/uL (ref 150–400)
RBC: 2.97 MIL/uL — ABNORMAL LOW (ref 3.87–5.11)
RDW: 17.4 % — ABNORMAL HIGH (ref 11.5–15.5)
WBC: 7.3 10*3/uL (ref 4.0–10.5)
nRBC: 0.3 % — ABNORMAL HIGH (ref 0.0–0.2)

## 2021-12-14 LAB — AMMONIA: Ammonia: 21 umol/L (ref 9–35)

## 2021-12-14 MED ORDER — SODIUM ZIRCONIUM CYCLOSILICATE 10 G PO PACK
10.0000 g | PACK | Freq: Every day | ORAL | Status: DC
  Administered 2021-12-14 – 2021-12-17 (×4): 10 g via ORAL
  Filled 2021-12-14 (×4): qty 1

## 2021-12-14 MED ORDER — SODIUM BICARBONATE 650 MG PO TABS
650.0000 mg | ORAL_TABLET | Freq: Two times a day (BID) | ORAL | Status: DC
  Administered 2021-12-14 – 2021-12-17 (×7): 650 mg via ORAL
  Filled 2021-12-14 (×7): qty 1

## 2021-12-14 NOTE — Assessment & Plan Note (Addendum)
Continue daily Lokelma Continue bicarbonate A.m. BMP

## 2021-12-14 NOTE — Hospital Course (Signed)
76 year old female with history of dementia, CKD stage IV, coronary disease, hypertension, diabetes mellitus type 2, renal cell carcinoma presenting with altered mental status.  The patient has been residing at [facility] for a little over 2 months.  Patient is unable to provide any history.  History is obtained from speaking with the patient's son.  Apparently, the patient was able to ambulate prior to her residence at [facility].  However in the past month, the patient has experienced a functional and cognitive decline.  At baseline, the patient has some pleasant confusion, but in the past month, the patient has been more confused and having decreased oral intake.  Apparently the patient has had numerous falls in the past 3 to 4 weeks resulting in functional decline.  She has been less communicative and more confused according to the son.  She has not been following any new medications.  There have been no reports of fevers, chills, chest pain, shortness breath, nausea, vomiting, diarrhea. Apparently, the patient sustained a fall around 11/20/2021.  She went to [facility].  Apparently she had a CT and MRI of the brain which were unremarkable. In the ED, the patient was afebrile hemodynamically stable with oxygen saturation 99% room air. BMP showed sodium 144, potassium 5.5, serum creatinine 2.28.  WBC 5.1, hemoglobin 5.4, platelets 161,000.  Patient was transfused 2 units PRBC.  Renal was consulted to assist with management.  FOBT was positive.  Her mental status showed a little improvement with IVF and IV synthroid, but she remained confused.  [place]oyd discussions with family were held.  Son, who is HPOA, decided against dialysis.  They wanted to take pt home with hospice and focus on comfort measures.

## 2021-12-14 NOTE — Assessment & Plan Note (Addendum)
Baseline creatinine 2.9-3.3 Presented with serum creatinine 8.28 Continue judicious IV fluids Appreciate nephrology consultation--discussed with Dr. Candiss Norse Discussed with patient's son (HPOA) possibility of HD >>we discussed that pt is not a good candidate given her comorbidities, dementia and poor baseline function and that dialysis would not change her overall trajectory or outcome >>12/15/21>>spoke with son>>do NOT want to place patient on HD>>optimize patient as much as possible for d/c home with palliative/hospice care

## 2021-12-14 NOTE — Assessment & Plan Note (Signed)
-   Most recent visit with Dr. Alyson Ingles was on 01/09/2021, who ordered renal ultrasound and was planning for follow-up visit in 6 months if no major changes.  However, renal ultrasound (02/05/2021) showed an increase in the size of the renal mass from 3 cm to 5 cm. --Patient will need to follow-up with urology in outpatient setting -- Currently not a good surgical candidate. -- Has not felt IR to be an ideal candidate for curative cryoablation

## 2021-12-14 NOTE — Consult Note (Signed)
Nephrology Consult   Assessment/Recommendations:   AKI on CKD4 -AKI vs progression of disease. Cr in Jan 2023 was 3.3 w/ eGFR of 14. Follows with Dr. Theador Hawthorne ([place]) as an outpatient. Underlying CKD likely related to DM and left renal atrophy. Cr slightly better with IV hydration, home lasix on hold. AKI can be related to pre-renal injury in the context of Lasix and acute anemia -although Cr is slightly better, her mental status does worry me and can be related to uremic symptoms. Given her pre-existing severe CKD, would consider renal replacement therapy at this junction. Overall, she is not an ideal long term candidate for dialysis given poor functional status, underlying dementia, and active RCC. I have attempted to call [name] (son, HCPOA) a couple of times but no answer. At this junction, would recommend palliative care consultation to address [place] -no obstruction on ultrasound -urine sediment relatively unrevealing (UA with >300 protein, has known proteinuria) -Continue to monitor daily Cr, Dose meds for GFR<15 -Maintain MAP>65 for optimal renal perfusion.  -Avoid nephrotoxic medications including NSAIDs and iodinated intravenous contrast exposure unless the latter is absolutely indicated.  Preferred narcotic agents for pain control are hydromorphone, fentanyl, and methadone. Morphine should not be used (on home med list). Avoid Baclofen and avoid oral sodium phosphate and magnesium citrate based laxatives / bowel preps. Continue strict Input and Output monitoring. Will monitor the patient closely with you and intervene or adjust therapy as indicated by changes in clinical status/labs   AMS -secondary to morphine/CKD +/- uremia, see above. Per primary  Hyperkalemia -starting lokelma 10g daily, renal diet  Hypertension -can utilize amlodipine if needed  Metabolic acidosis -will start sodium bicarb 645m BID for now  Acute blood loss anemia, anemia due to CKD -Transfuse for Hgb<7 g/dL s/p  2U PRBC. Hgb 5.4 -> 9.1 today -FOBT positive in ER, GI consulted -would avoid ESA given active RCC  Diabetes Mellitus Type 2 -per primary  History of dementia -per primary, resides in nursing home  RCC -~6cm mass rt kidney, stable on ultrasound   Recommendations conveyed to primary service.    [facility] 12/14/2021 9:18 AM   _____________________________________________________________________________________   History of Present Illness: Erin GILCREASEis a/an 76y.o. female with a past medical history of CKD 4, dementia, DM 2, hypertension, history of CABG, renal cell carcinoma (right kidney) who presents from nursing home for change in mental status. She was found to have AKI and a Hgb of 5.4. Per documentation, was FOBT positive. GI consulted. Received 2u prbc and Hgb is now 9.1. Also receiving 1/2 NS x 15 hrs. UOP charted 0.5L thus far. ROS unobtainable-AMS.   Medications:  Current Facility-Administered Medications  Medication Dose Route Frequency Provider Last Rate Last Admin   0.45 % sodium chloride infusion   Intravenous Continuous Emokpae, Ejiroghene E, MD 100 mL/hr at 12/13/21 2356 New Bag at 12/13/21 2356   acetaminophen (TYLENOL) tablet 650 mg  650 mg Oral Q6H PRN Emokpae, Ejiroghene E, MD       Or   acetaminophen (TYLENOL) suppository 650 mg  650 mg Rectal Q6H PRN Emokpae, Ejiroghene E, MD       levothyroxine (SYNTHROID) tablet 150 mcg  150 mcg Oral Daily Emokpae, Ejiroghene E, MD   150 mcg at 12/14/21 0631   ondansetron (ZOFRAN) tablet 4 mg  4 mg Oral Q6H PRN Emokpae, Ejiroghene E, MD       Or   ondansetron (ZOFRAN) injection 4 mg  4 mg  Intravenous Q6H PRN Emokpae, Ejiroghene E, MD       polyethylene glycol (MIRALAX / GLYCOLAX) packet 17 g  17 g Oral Daily PRN Emokpae, Ejiroghene E, MD         ALLERGIES Patient has no allergy information on record.  MEDICAL HISTORY Past Medical History:  Diagnosis Date   Arthritis    Cancer  of kidney ([place])    CKD (chronic kidney disease), stage IV ([place])    Coronary atherosclerosis of native coronary artery 2006   Multivessel status post CABG in [place], graft disease documented March 2019 with DES to SVG to diagonal   Essential hypertension    Free monoclonal light chain 04/14/2012   History of diabetes mellitus, type II    Hyperlipidemia    Hypothyroidism    NSTEMI (non-ST elevated myocardial infarction) ([place]) 06/24/2017   Pneumonia    hx of 2021    Renal hematoma    Right renal mass    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   She has dementia   Social Determinants of Health   Financial Resource Strain: Medium Risk (09/06/2020)   Overall Financial Resource Strain (CARDIA)    Difficulty of Paying Living Expenses: Somewhat hard  Food Insecurity: No Food Insecurity (09/06/2020)   Hunger Vital Sign    Worried About Running Out of Food in the Last Year: Never true    Ran Out of Food in the Last Year: Never true  Transportation Needs: No Transportation Needs (09/06/2020)   PRAPARE - Hydrologist (Medical): No    Lack of Transportation (Non-Medical): No  Physical Activity: Insufficiently Active (09/06/2020)   Exercise Vital Sign    Days of Exercise per Week: 7 days    Minutes of Exercise per Session: 20 min  Stress: No Stress Concern Present (09/06/2020)   [place]    Feeling of Stress : Only a little  Social Connections: Moderately Isolated (09/06/2020)   Social Connection and Isolation Panel [NHANES]    Frequency of Communication with Friends and Family: More than three times a week    Frequency of Social Gatherings with Friends and Family: More than three times a week    Attends Religious Services: 1 to 4 times per year    Active Member of [employer] or Organizations: No    Attends Archivist Meetings: Never    Marital Status: Widowed  Intimate Partner Violence: Not At Risk (09/06/2020)   Humiliation, Afraid, Rape, and Kick questionnaire    Fear of Current or Ex-Partner: No    Emotionally Abused: No    Physically Abused: No    Sexually Abused: No     FAMILY HISTORY Family History  Problem Relation Age of Onset   Heart attack  Mother    Hypertension Mother    Seizures Son    Seizures Son    Seizures Daughter      Review of Systems: Unobtainable, patient has AMS.  Physical Exam: Vitals:   12/14/21 0007 12/14/21 0234  BP: 135/77 (!) 179/81  Pulse: 64 66  Resp: 14 16  Temp: (!) 97 F (36.1 C) (!) 97.2 F (36.2 C)  SpO2:     No intake/output data recorded.  Intake/Output Summary (Last 24 hours) at 12/14/2021 4830 Last data filed at 12/14/2021 0234 Gross per 24 hour  Intake 881.83 ml  Output 500 ml  Net 381.83 ml   General: chronically ill appearing, agitated, laying flat in bed HEENT: dry MMM CV: RRR, no murmurs Lungs: clear to auscultation bilaterally, normal work of breathing Abd: soft, non-tender, non-distended Ext: trace dependent edema, mitts in place Neuro: altered mentation, not following commands  Test Results Reviewed Lab Results  Component Value Date   NA 145 12/14/2021   K 5.5 (H) 12/14/2021   CL 117 (H) 12/14/2021   CO2 20 (L) 12/14/2021   BUN 76 (H) 12/14/2021   CREATININE 7.98 (H) 12/14/2021   CALCIUM 7.7 (L) 12/14/2021   ALBUMIN  2.9 (L) 12/13/2021   PHOS 5.1 (H) 08/02/2019     I have reviewed all relevant outside healthcare records related to the patient's kidney injury.

## 2021-12-14 NOTE — TOC Initial Note (Addendum)
Transition of Care [facility]) - Initial/Assessment Note    Patient Details  Name: Erin Jordan MRN: [id number] Date of Birth: 23-Mar-1946  Transition of Care [facility]) CM/SW Contact:    Ihor Gully, LCSW Phone Number: 12/14/2021, 2:25 PM  Clinical Narrative:                 Patient from [facility]. She has been a resident for the past two months. Admitted  for Acute renal failure superimposed on stage 4 chronic kidney disease, unspecified acute renal failure type. She has been a resident at [facility] for the past two months. Daughters and granddaughter when to visit patient yesterday and were displeased with patient's condition (in a Geri-chair facing the wall, tongue hanging out of her month, with bruising, knots on face, swollen face). She states that her family demanded that EMS be contacted.  Renee indicates that at d/c patient will be going to reside with her youngest daughter, Delray Alt, in [place].  [facility] is aware of allegations.   Expected Discharge Plan: [place] Barriers to Discharge: Continued Medical Work up   Patient Goals and CMS Choice Patient states their goals for this hospitalization and ongoing recovery are:: d/c to youngest daughter's house      Expected Discharge Plan and Services Expected Discharge Plan: [place]       Living arrangements for the past 2 months: [place]                                      Prior Living Arrangements/Services Living arrangements for the past 2 months: [place] Lives with:: Facility Resident   Do you feel safe going back to the place where you live?: No   daughter, Joseph Art, states that she and her siblings are not happy with the care that patient's was receiving at [facility] due to them finding her with multiple bruises.  Need for Family Participation in Patient Care: Yes (Comment) Care giver support system in place?: Yes  (comment)   Criminal Activity/Legal Involvement Pertinent to Current Situation/Hospitalization: No - Comment as needed  Activities of Daily Living      Permission Sought/Granted Permission sought to share information with : Family Supports    Share Information with NAME: daugter, Renee           Emotional Assessment       Orientation: : Oriented to Self Alcohol / Substance Use: Not Applicable    Admission diagnosis:  Acute on chronic kidney failure ([place]) [N17.9, N18.9] Acute renal failure, unspecified acute renal failure type ([place]) [N17.9] Altered mental status, unspecified altered mental status type [R41.82] Anemia, unspecified type [D64.9] Acute renal failure superimposed on stage 4 chronic kidney disease, unspecified acute renal failure type ([place]) [N17.9, N18.4] Patient Active Problem List   Diagnosis Date Noted   Acute renal failure superimposed on stage 4 chronic kidney disease, unspecified acute renal failure type ([place]) 12/14/2021   Hyperkalemia 12/14/2021   Anemia 40/81/4481   Acute metabolic encephalopathy 85/63/1497   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   Renal cell carcinoma ([place]) 08/02/2019   RLS (restless legs syndrome) 01/25/2019   Right renal mass 01/25/2019   Splenic mass 01/25/2019   Blood in stool 01/25/2019   Dementia without behavioral disturbance ([place]) 01/25/2019   Osteopenia 05/20/2018   Weight loss, unintentional 07/14/2017   CAD -S/P CABG in  2006 AND  PCI 06/26/17 06/27/2017   Free monoclonal light chain 04/14/2012   Hx of CABG- 2006    Prediabetes    Essential hypertension    Hypokalemia 06/02/2011   Dyslipidemia, goal LDL below 70 06/02/2011   PCP:  [name], FNP Pharmacy:   [facility], [address] [place] [address] Phone: [phone] Fax: [phone]     Social Determinants of Health ([place]) Interventions    Readmission Risk Interventions    03/30/2020    1:10 PM  03/29/2020    4:18 PM  Readmission Risk Prevention Plan  Transportation Screening Complete Complete  PCP or Specialist Appt within 5-7 Days  Not Complete  PCP or Specialist Appt within 3-5 Days Complete   Home Care Screening  Complete  Medication Review (RN CM)  Complete  HRI or Home Care Consult Complete   Social Work Consult for [place] Planning/Counseling Complete   Palliative Care Screening Not Applicable   Medication Review Press photographer) Complete

## 2021-12-14 NOTE — Assessment & Plan Note (Signed)
At baseline the patient was pleasantly confused and able to ambulate prior to November 20, 2021 She has declined functionally and cognitively since that period of time

## 2021-12-14 NOTE — Progress Notes (Signed)
  Transition of Care [facility]) Screening Note   Patient Details  Name: Erin Jordan Date of Birth: [birth year]   Transition of Care [facility]) CM/SW Contact:    Ihor Gully, LCSW Phone Number: 12/14/2021, 2:10 PM    Transition of Care Department [facility]) has reviewed patient and no TOC needs have been identified at this time. We will continue to monitor patient advancement through interdisciplinary progression rounds. If new patient transition needs arise, please place a TOC consult.

## 2021-12-14 NOTE — Progress Notes (Addendum)
PROGRESS NOTE  Erin Jordan SJG:[id number] DOB: Aug 17, 1945 DOA: 12/13/2021 PCP: [name], FNP  Brief History:  76 year old female with history of dementia, CKD stage IV, coronary disease, hypertension, diabetes mellitus type 2, renal cell carcinoma presenting with altered mental status.  The patient has been residing at [facility] for a little over 2 months.  Patient is unable to provide any history.  History is obtained from speaking with the patient's son.  Apparently, the patient was able to ambulate prior to her residence at [place].  However in the past month, the patient has experienced a functional and cognitive decline.  At baseline, the patient has some pleasant confusion, but in the past month, the patient has been more confused and having decreased oral intake.  Apparently the patient has had numerous falls in the past 3 to 4 weeks resulting in functional decline.  She has been less communicative and more confused according to the son.  She has not been following any new medications.  There have been no reports of fevers, chills, chest pain, shortness breath, nausea, vomiting, diarrhea. Apparently, the patient sustained a fall around 11/20/2021.  She went to [facility].  Apparently she had a CT and MRI of the brain which were unremarkable. In the ED, the patient was afebrile hemodynamically stable with oxygen saturation 99% room air. BMP showed sodium 144, potassium 5.5, serum creatinine 2.28.  WBC 5.1, hemoglobin 5.4, platelets 161,000.  Patient was transfused 2 units PRBC.  Renal was consulted to assist with management.  FOBT was positive.     Assessment and Plan: * Acute renal failure superimposed on stage 4 chronic kidney disease, unspecified acute renal failure type (HCC) Baseline creatinine 2.9-3.3 Presented with serum creatinine 8.28 Continue judicious IV fluids Appreciate nephrology consultation--discussed with Dr. Candiss Norse  Anemia Hgb 5.5.   Baseline about 10-12.  Stool occult positive.  Not on anticoagulation antiplatelet. -2 units PRBC ordered -Check anemia panel -EDP talked to GI, GI to see   Acute metabolic encephalopathy Multifactorial including acute on chronic renal failure, acute blood loss anemia, volume depletion M54 [id number] Folic acid 3.4 Check TSH Check ammonia Check VBG  Hyperkalemia Continue daily Lokelma Continue bicarbonate A.m. BMP  Renal cell carcinoma ([place]) - Most recent visit with Dr. Alyson Ingles was on 01/09/2021, who ordered renal ultrasound and was planning for follow-up visit in 6 months if no major changes.  However, renal ultrasound (02/05/2021) showed an increase in the size of the renal mass from 3 cm to 5 cm. --Patient will need to follow-up with urology in outpatient setting -- Currently not a good surgical candidate. -- Has not felt IR to be an ideal candidate for curative cryoablation  Dementia without behavioral disturbance ([place]) At baseline the patient was pleasantly confused and able to ambulate prior to November 20, 2021 She has declined functionally and cognitively since that period of time  Essential hypertension Systolic 546 - 568.  Lasix 20 mg daily listed on med list.  Discontinue furosemide           Family Communication:  son updated 9/8  Consultants:  renal  Code Status:  DNR  DVT Prophylaxis:  [place] Heparin    Procedures: As Listed in Progress Note Above  Antibiotics: None       Subjective: Review of systems limited secondary to patient's dementia and confusion.  The patient denies any chest pain, abdominal pain, shortness breath, headache.  Remainder review of  systems is unobtainable.  Objective: Vitals:   12/13/21 2348 12/14/21 0003 12/14/21 0007 12/14/21 0234  BP: (!) 134/96 135/77 135/77 (!) 179/81  Pulse: 65 64 64 66  Resp: '14 14 14 16  '$ Temp: 97.6 F (36.4 C) (!) 97 F (36.1 C) (!) 97 F (36.1 C) (!) 97.2 F (36.2 C)  TempSrc: Axillary  Axillary Axillary Axillary  SpO2: 99% 99%      Intake/Output Summary (Last 24 hours) at 12/14/2021 1302 Last data filed at 12/14/2021 1100 Gross per 24 hour  Intake 881.83 ml  Output 650 ml  Net 231.83 ml   Weight change:  Exam:  General:  Pt is alert, follows commands appropriately, not in acute distress HEENT: No icterus, No thrush, No neck mass, [place]/AT Cardiovascular: RRR, S1/S2, no rubs, no gallops Respiratory: Fine bibasilar rales, no wheezing.  Abdomen Abdomen: Soft/+BS, non tender, non distended, no guarding Extremities: No edema, No lymphangitis, No petechiae, No rashes, no synovitis   Data Reviewed: I have personally reviewed following labs and imaging studies Basic Metabolic Panel: Recent Labs  Lab 12/13/21 1526 12/14/21 0311  NA 144 145  K 5.5* 5.5*  CL 119* 117*  CO2 19* 20*  GLUCOSE 90 73  BUN 77* 76*  CREATININE 8.28* 7.98*  CALCIUM 7.4* 7.7*   Liver Function Tests: Recent Labs  Lab 12/13/21 1526  AST 37  ALT 31  ALKPHOS 104  BILITOT 0.6  PROT 5.9*  ALBUMIN 2.9*   No results for input(s): "LIPASE", "AMYLASE" in the last 168 hours. No results for input(s): "AMMONIA" in the last 168 hours. Coagulation Profile: Recent Labs  Lab 12/13/21 1602  INR 1.2   CBC: Recent Labs  Lab 12/13/21 1526 12/14/21 0311  WBC 5.1 7.3  NEUTROABS 3.8  --   HGB 5.4* 9.1*  HCT 18.1* 29.6*  MCV 105.2* 99.7  PLT 161 191   Cardiac Enzymes: No results for input(s): "CKTOTAL", "CKMB", "CKMBINDEX", "TROPONINI" in the last 168 hours. BNP: Invalid input(s): "POCBNP" CBG: Recent Labs  Lab 12/13/21 1452 12/13/21 2213  GLUCAP 82 74   HbA1C: No results for input(s): "HGBA1C" in the last 72 hours. Urine analysis:    Component Value Date/Time   COLORURINE YELLOW 12/13/2021 1630   APPEARANCEUR CLEAR 12/13/2021 1630   APPEARANCEUR Hazy (A) 01/09/2021 1634   LABSPEC 1.015 12/13/2021 1630   PHURINE 7.0 12/13/2021 1630   GLUCOSEU NEGATIVE 12/13/2021 1630   HGBUR  NEGATIVE 12/13/2021 1630   BILIRUBINUR NEGATIVE 12/13/2021 1630   BILIRUBINUR Negative 01/09/2021 1634   KETONESUR NEGATIVE 12/13/2021 1630   PROTEINUR >=300 (A) 12/13/2021 1630   UROBILINOGEN 0.2 05/19/2019 0947   UROBILINOGEN 0.2 06/01/2011 1822   NITRITE NEGATIVE 12/13/2021 1630   LEUKOCYTESUR NEGATIVE 12/13/2021 1630   Sepsis Labs: '@LABRCNTIP'$ (procalcitonin:4,lacticidven:4) ) Recent Results (from the past 240 hour(s))  Resp Panel by RT-PCR (Flu A&B, Covid) Anterior Nasal Swab     Status: None   Collection Time: 12/13/21  3:05 PM   Specimen: Anterior Nasal Swab  Result Value Ref Range Status   SARS Coronavirus 2 by RT PCR NEGATIVE NEGATIVE Final    Comment: (NOTE) SARS-CoV-2 target nucleic acids are NOT DETECTED.  The SARS-CoV-2 RNA is generally detectable in upper respiratory specimens during the acute phase of infection. The lowest concentration of SARS-CoV-2 viral copies this assay can detect is 138 copies/mL. A negative result does not preclude SARS-Cov-2 infection and should not be used as the sole basis for treatment or other patient management decisions. A negative result  may occur with  improper specimen collection/handling, submission of specimen other than nasopharyngeal swab, presence of viral mutation(s) within the areas targeted by this assay, and inadequate number of viral copies(<138 copies/mL). A negative result must be combined with clinical observations, patient history, and epidemiological information. The expected result is Negative.  Fact Sheet for Patients:  EntrepreneurPulse.com.au  Fact Sheet for Healthcare Providers:  IncredibleEmployment.be  This test is no t yet approved or cleared by the [place] FDA and  has been authorized for detection and/or diagnosis of SARS-CoV-2 by FDA under an Emergency Use Authorization (EUA). This EUA will remain  in effect (meaning this test can be used) for the duration of  the COVID-19 declaration under Section 564(b)(1) of the Act, 21 U.S.C.section 360bbb-3(b)(1), unless the authorization is terminated  or revoked sooner.       Influenza A by PCR NEGATIVE NEGATIVE Final   Influenza B by PCR NEGATIVE NEGATIVE Final    Comment: (NOTE) The Xpert Xpress SARS-CoV-2/FLU/RSV plus assay is intended as an aid in the diagnosis of influenza from Nasopharyngeal swab specimens and should not be used as a sole basis for treatment. Nasal washings and aspirates are unacceptable for Xpert Xpress SARS-CoV-2/FLU/RSV testing.  Fact Sheet for Patients: EntrepreneurPulse.com.au  Fact Sheet for Healthcare Providers: IncredibleEmployment.be  This test is not yet approved or cleared by the [place] FDA and has been authorized for detection and/or diagnosis of SARS-CoV-2 by FDA under an Emergency Use Authorization (EUA). This EUA will remain in effect (meaning this test can be used) for the duration of the COVID-19 declaration under Section 564(b)(1) of the Act, 21 U.S.C. section 360bbb-3(b)(1), unless the authorization is terminated or revoked.  Performed at [facility], [address] [place], [place] [address]      Scheduled Meds:  levothyroxine  150 mcg Oral Daily   sodium bicarbonate  650 mg Oral BID   sodium zirconium cyclosilicate  10 g Oral Daily   Continuous Infusions:  sodium chloride 100 mL/hr at 12/13/21 2356    Procedures/Studies: US RENAL  Result Date: 12/14/2021 CLINICAL DATA:  Acute renal failure EXAM: RENAL / URINARY TRACT ULTRASOUND COMPLETE COMPARISON:  Renal ultrasound dated July 03, 2021. FINDINGS: Right Kidney: Renal measurements: 8.8 x 6.7 x 6.5 cm = volume: 198.6 mL. Solid mass of the lower pole of the right kidney measuring 6.3 x 5.6 x 6.2 cm, previously 6.1 x 5.7 x 6.3 cm. Increased parenchymal echogenicity. No hydronephrosis visualized. Left Kidney: Renal measurements: 8.2 x 4.2 x 4.2 cm = volume:  75.6 mL. Atrophic. Echogenicity within normal limits. Increased parenchymal echogenicity. Bladder: Bladder is not visualized due to overlying bowel gas. Other: None. IMPRESSION: 1. Solid mass of the lower pole of the right kidney, highly concerning for RCC, size is similar to prior ultrasound. 2. Increased parenchymal echogenicity, findings can be seen in the setting of medical renal disease. 3. No hydronephrosis. Electronically Signed   By: Yetta Glassman M.D.   On: 12/14/2021 09:02   DG Chest 1 View  Result Date: 12/13/2021 CLINICAL DATA:  Status post fall from bed. EXAM: CHEST  1 VIEW COMPARISON:  Chest radiograph 10/29/2020 FINDINGS: Monitoring leads overlie the patient. Cardiac contours are prominent. Bilateral interstitial pulmonary opacities. Probable small layering left pleural effusion. No definite pneumothorax. IMPRESSION: Cardiomegaly. Possible interstitial edema. Small layering left pleural effusion. Electronically Signed   By: Lovey Newcomer M.D.   On: 12/13/2021 17:41   CT Head Wo Contrast  Result Date: 12/13/2021 CLINICAL DATA:  Altered  mental status.  Patient is status post fall. EXAM: CT HEAD WITHOUT CONTRAST TECHNIQUE: Contiguous axial images were obtained from the base of the skull through the vertex without intravenous contrast. RADIATION DOSE REDUCTION: This exam was performed according to the departmental dose-optimization program which includes automated exposure control, adjustment of the mA and/or kV according to patient size and/or use of iterative reconstruction technique. COMPARISON:  Brain CT 01/08/2021 FINDINGS: Brain: Ventricles and sulci are prominent. Periventricular and subcortical white matter hypodensities compatible with chronic microvascular ischemic changes. No evidence for acute cortically based infarct, intracranial hemorrhage, mass lesion or mass-effect. Vascular: Unremarkable Skull: Intact. Sinuses/Orbits: Paranasal sinuses are well aerated. Mastoid air cells are  unremarkable. Other: Soft tissue swelling overlying the left frontal calvarium. IMPRESSION: No acute intracranial process. Soft tissue swelling overlying the left frontal calvarium. Electronically Signed   By: Lovey Newcomer M.D.   On: 12/13/2021 17:38    Orson Eva, DO  [facility]  If 7PM-7AM, please contact night-coverage [email or web address] Password [email or web address] 12/14/2021, 1:02 PM   LOS: 0 days

## 2021-12-15 DIAGNOSIS — F0390 Unspecified dementia without behavioral disturbance: Secondary | ICD-10-CM | POA: Diagnosis not present

## 2021-12-15 DIAGNOSIS — E875 Hyperkalemia: Secondary | ICD-10-CM

## 2021-12-15 DIAGNOSIS — N179 Acute kidney failure, unspecified: Secondary | ICD-10-CM | POA: Diagnosis not present

## 2021-12-15 DIAGNOSIS — C649 Malignant neoplasm of unspecified kidney, except renal pelvis: Secondary | ICD-10-CM

## 2021-12-15 LAB — RENAL FUNCTION PANEL
Albumin: 2.8 g/dL — ABNORMAL LOW (ref 3.5–5.0)
Anion gap: 6 (ref 5–15)
BUN: 74 mg/dL — ABNORMAL HIGH (ref 8–23)
CO2: 18 mmol/L — ABNORMAL LOW (ref 22–32)
Calcium: 7.4 mg/dL — ABNORMAL LOW (ref 8.9–10.3)
Chloride: 119 mmol/L — ABNORMAL HIGH (ref 98–111)
Creatinine, Ser: 7.88 mg/dL — ABNORMAL HIGH (ref 0.44–1.00)
GFR, Estimated: 5 mL/min — ABNORMAL LOW (ref >60)
Glucose, Bld: 76 mg/dL (ref 70–99)
Phosphorus: 6.2 mg/dL — ABNORMAL HIGH (ref 2.5–4.6)
Potassium: 5.1 mmol/L (ref 3.5–5.1)
Sodium: 143 mmol/L (ref 135–145)

## 2021-12-15 LAB — GLUCOSE, CAPILLARY
Glucose-Capillary: 68 mg/dL — ABNORMAL LOW (ref 70–99)
Glucose-Capillary: 75 mg/dL (ref 70–99)

## 2021-12-15 LAB — CBC
HCT: 27.2 % — ABNORMAL LOW (ref 36.0–46.0)
Hemoglobin: 8.5 g/dL — ABNORMAL LOW (ref 12.0–15.0)
MCH: 31 pg (ref 26.0–34.0)
MCHC: 31.3 g/dL (ref 30.0–36.0)
MCV: 99.3 fL (ref 80.0–100.0)
Platelets: 175 10*3/uL (ref 150–400)
RBC: 2.74 MIL/uL — ABNORMAL LOW (ref 3.87–5.11)
RDW: 18 % — ABNORMAL HIGH (ref 11.5–15.5)
WBC: 6 10*3/uL (ref 4.0–10.5)
nRBC: 0.3 % — ABNORMAL HIGH (ref 0.0–0.2)

## 2021-12-15 LAB — HEMOGLOBIN A1C
Hgb A1c MFr Bld: 5.1 % (ref 4.8–5.6)
Mean Plasma Glucose: 99.67 mg/dL

## 2021-12-15 LAB — FOLATE: Folate: 3.4 ng/mL — ABNORMAL LOW (ref >5.9)

## 2021-12-15 MED ORDER — SODIUM CHLORIDE 0.9 % IV SOLN: 1.0000 mg | Freq: Every day | INTRAVENOUS | Status: DC

## 2021-12-15 MED ORDER — SODIUM CHLORIDE 0.45 % IV SOLN: INTRAVENOUS | Status: DC

## 2021-12-15 MED ORDER — CHLORHEXIDINE GLUCONATE CLOTH 2 % EX PADS
6.0000 | MEDICATED_PAD | Freq: Every day | CUTANEOUS | Status: DC
  Administered 2021-12-15 – 2021-12-17 (×3): 6 via TOPICAL

## 2021-12-15 MED ORDER — LEVOTHYROXINE SODIUM 100 MCG/5ML IV SOLN
75.0000 ug | Freq: Every day | INTRAVENOUS | Status: DC
  Administered 2021-12-16 – 2021-12-17 (×2): 75 ug via INTRAVENOUS
  Filled 2021-12-15 (×2): qty 5

## 2021-12-15 MED ORDER — FOLIC ACID 5 MG/ML IJ SOLN
1.0000 mg | Freq: Every day | INTRAMUSCULAR | Status: DC
  Administered 2021-12-15 – 2021-12-17 (×3): 1 mg via INTRAVENOUS
  Filled 2021-12-15 (×3): qty 0.2

## 2021-12-15 NOTE — Progress Notes (Signed)
PROGRESS NOTE  Erin Jordan DUK:[id number] DOB: 16-May-1945 DOA: 12/13/2021 PCP: [name], FNP  Brief History:  76 year old female with history of dementia, CKD stage IV, coronary disease, hypertension, diabetes mellitus type 2, renal cell carcinoma presenting with altered mental status.  The Erin Jordan has been residing at [facility] for a little over 2 months.  Erin Jordan is unable to provide any history.  History is obtained from speaking with the Erin Jordan's son.  Apparently, the Erin Jordan was able to ambulate prior to her residence at [facility].  However in the past month, the Erin Jordan has experienced a functional and cognitive decline.  At baseline, the Erin Jordan has some pleasant confusion, but in the past month, the Erin Jordan has been more confused and having decreased oral intake.  Apparently the Erin Jordan has had numerous falls in the past 3 to 4 weeks resulting in functional decline.  She has been less communicative and more confused according to the son.  She has not been following any new medications.  There have been no reports of fevers, chills, chest pain, shortness breath, nausea, vomiting, diarrhea. Apparently, the Erin Jordan sustained a fall around 11/20/2021.  She went to [facility].  Apparently she had a CT and MRI of the brain which were unremarkable. In the ED, the Erin Jordan was afebrile hemodynamically stable with oxygen saturation 99% room air. BMP showed sodium 144, potassium 5.5, serum creatinine 2.28.  WBC 5.1, hemoglobin 5.4, platelets 161,000.  Erin Jordan was transfused 2 units PRBC.  Renal was consulted to assist with management.  FOBT was positive.     Assessment and Plan: * Acute renal failure superimposed on stage 4 chronic kidney disease, unspecified acute renal failure type (HCC) Baseline creatinine 2.9-3.3 Presented with serum creatinine 8.28 Continue judicious IV fluids Appreciate nephrology consultation--discussed with Dr. Candiss Norse Discussed with Erin Jordan's  son (HPOA) possibility of HD >>we discussed that pt is not a good candidate given her comorbidities, dementia and poor baseline function and that dialysis would not change her overall trajectory or outcome >>12/15/21>>spoke with son>>do NOT want to place Erin Jordan on HD>>optimize Erin Jordan as much as possible for d/c home with palliative/hospice care  Anemia Hgb 5.5.  Baseline about 10-12.  Stool occult positive.  Not on anticoagulation antiplatelet. -2 units PRBC ordered -no signs of active blood loss presently -Check anemia panel>>> B12 1406, folate 3.4 -----iron sat 12, ferritin 376   Acute metabolic encephalopathy Multifactorial including acute on chronic renal failure, acute blood loss anemia, volume depletion,  hypothyroidism E83 1517 Folic acid 6.1>>YWVPXTG Check TSH 193.06, Free T4 0.51>>start IV repletion Check ammonia--21 Check VBG 7,29/34/45/16 --remains encephalopathic  Hyperkalemia Continue daily Lokelma Continue bicarbonate A.m. BMP  Renal cell carcinoma ([place]) - Most recent visit with Dr. Alyson Ingles was on 01/09/2021, who ordered renal ultrasound and was planning for follow-up visit in 6 months if no major changes.  However, renal ultrasound (02/05/2021) showed an increase in the size of the renal mass from 3 cm to 5 cm. --Erin Jordan will need to follow-up with urology in outpatient setting -- Currently not a good surgical candidate. -- Has not felt IR to be an ideal candidate for curative cryoablation  Dementia without behavioral disturbance ([place]) At baseline the Erin Jordan was pleasantly confused and able to ambulate prior to November 20, 2021 She has declined functionally and cognitively since that period of time  Essential hypertension Systolic 626 - 948.  Lasix 20 mg daily listed on med list.  Discontinue furosemide  Family Communication: son updated 9/9  Consultants:  renal  Code Status:  DNR  DVT Prophylaxis:  SCDs   Procedures: As Listed in Progress  Note Above  Antibiotics: None   Total time spent 50 minutes.  Greater than 50% spent face to face counseling and coordinating care.     Subjective:  Erin Jordan is awake and alert and intermittently answers yes/no.  Denies f/c, cp, sob, abd pain.  Remainder ROS unobtainable Objective: Vitals:   12/14/21 2030 12/15/21 0340 12/15/21 0500 12/15/21 1313  BP: 130/82  (!) 161/62 (!) 154/68  Pulse: 75  68 67  Resp: '16 16  13  '$ Temp: 98.7 F (37.1 C)  97.8 F (36.6 C) 98.2 F (36.8 C)  TempSrc: Axillary  Tympanic Oral  SpO2: 98%  100% 99%    Intake/Output Summary (Last 24 hours) at 12/15/2021 1631 Last data filed at 12/15/2021 1600 Gross per 24 hour  Intake 623.73 ml  Output 200 ml  Net 423.73 ml   Weight change:  Exam:  General:  Pt is alert, intermittently follows commands, not in acute distress HEENT: No icterus, No thrush, No neck mass, /AT Cardiovascular: RRR, S1/S2, no rubs, no gallops Respiratory: bibasilar rales. No wheeze Abdomen: Soft/+BS, non tender, non distended, no guarding Extremities: No edema, No lymphangitis, No petechiae, No rashes, no synovitis   Data Reviewed: I have personally reviewed following labs and imaging studies Basic Metabolic Panel: Recent Labs  Lab 12/13/21 1526 12/14/21 0311 12/15/21 0524  NA 144 145 143  K 5.5* 5.5* 5.1  CL 119* 117* 119*  CO2 19* 20* 18*  GLUCOSE 90 73 76  BUN 77* 76* 74*  CREATININE 8.28* 7.98* 7.88*  CALCIUM 7.4* 7.7* 7.4*  PHOS  --   --  6.2*   Liver Function Tests: Recent Labs  Lab 12/13/21 1526 12/15/21 0524  AST 37  --   ALT 31  --   ALKPHOS 104  --   BILITOT 0.6  --   PROT 5.9*  --   ALBUMIN 2.9* 2.8*   No results for input(s): "LIPASE", "AMYLASE" in the last 168 hours. Recent Labs  Lab 12/14/21 1328  AMMONIA 21   Coagulation Profile: Recent Labs  Lab 12/13/21 1602  INR 1.2   CBC: Recent Labs  Lab 12/13/21 1526 12/14/21 0311 12/15/21 0524  WBC 5.1 7.3 6.0  NEUTROABS 3.8  --   --    HGB 5.4* 9.1* 8.5*  HCT 18.1* 29.6* 27.2*  MCV 105.2* 99.7 99.3  PLT 161 191 175   Cardiac Enzymes: No results for input(s): "CKTOTAL", "CKMB", "CKMBINDEX", "TROPONINI" in the last 168 hours. BNP: Invalid input(s): "POCBNP" CBG: Recent Labs  Lab 12/13/21 1452 12/13/21 2213 12/15/21 0503 12/15/21 0531  GLUCAP 82 74 68* 75   HbA1C: Recent Labs    12/14/21 1328  HGBA1C 5.1   Urine analysis:    Component Value Date/Time   COLORURINE YELLOW 12/13/2021 1630   APPEARANCEUR CLEAR 12/13/2021 1630   APPEARANCEUR Hazy (A) 01/09/2021 1634   LABSPEC 1.015 12/13/2021 1630   PHURINE 7.0 12/13/2021 1630   GLUCOSEU NEGATIVE 12/13/2021 1630   HGBUR NEGATIVE 12/13/2021 1630   BILIRUBINUR NEGATIVE 12/13/2021 1630   BILIRUBINUR Negative 01/09/2021 1634   KETONESUR NEGATIVE 12/13/2021 1630   PROTEINUR >=300 (A) 12/13/2021 1630   UROBILINOGEN 0.2 05/19/2019 0947   UROBILINOGEN 0.2 06/01/2011 1822   NITRITE NEGATIVE 12/13/2021 1630   LEUKOCYTESUR NEGATIVE 12/13/2021 1630   Sepsis Labs: '@LABRCNTIP'$ (procalcitonin:4,lacticidven:4) ) Recent Results (from the past 240  hour(s))  Resp Panel by RT-PCR (Flu A&B, Covid) Anterior Nasal Swab     Status: None   Collection Time: 12/13/21  3:05 PM   Specimen: Anterior Nasal Swab  Result Value Ref Range Status   SARS Coronavirus 2 by RT PCR NEGATIVE NEGATIVE Final    Comment: (NOTE) SARS-CoV-2 target nucleic acids are NOT DETECTED.  The SARS-CoV-2 RNA is generally detectable in upper respiratory specimens during the acute phase of infection. The lowest concentration of SARS-CoV-2 viral copies this assay can detect is 138 copies/mL. A negative result does not preclude SARS-Cov-2 infection and should not be used as the sole basis for treatment or other [facility] management decisions. A negative result may occur with  improper specimen collection/handling, submission of specimen other than nasopharyngeal swab, presence of viral mutation(s) within  the areas targeted by this assay, and inadequate number of viral copies(<138 copies/mL). A negative result must be combined with clinical observations, [facility] history, and epidemiological information. The expected result is Negative.  Fact Sheet for Patients:  EntrepreneurPulse.com.au  Fact Sheet for Healthcare Providers:  IncredibleEmployment.be  This test is no t yet approved or cleared by the [place] FDA and  has been authorized for detection and/or diagnosis of SARS-CoV-2 by FDA under an Emergency Use Authorization (EUA). This EUA will remain  in effect (meaning this test can be used) for the duration of the COVID-19 declaration under Section 564(b)(1) of the Act, 21 U.S.C.section 360bbb-3(b)(1), unless the authorization is terminated  or revoked sooner.       Influenza A by PCR NEGATIVE NEGATIVE Final   Influenza B by PCR NEGATIVE NEGATIVE Final    Comment: (NOTE) The Xpert Xpress SARS-CoV-2/FLU/RSV plus assay is intended as an aid in the diagnosis of influenza from Nasopharyngeal swab specimens and should not be used as a sole basis for treatment. Nasal washings and aspirates are unacceptable for Xpert Xpress SARS-CoV-2/FLU/RSV testing.  Fact Sheet for Patients: EntrepreneurPulse.com.au  Fact Sheet for Healthcare Providers: IncredibleEmployment.be  This test is not yet approved or cleared by the [place] FDA and has been authorized for detection and/or diagnosis of SARS-CoV-2 by FDA under an Emergency Use Authorization (EUA). This EUA will remain in effect (meaning this test can be used) for the duration of the COVID-19 declaration under Section 564(b)(1) of the Act, 21 U.S.C. section 360bbb-3(b)(1), unless the authorization is terminated or revoked.  Performed at [facility], [address], [place], [place] [address]      Scheduled Meds:  Chlorhexidine Gluconate Cloth  6 each  Topical Daily   folic acid  1 mg Intravenous Daily   [START ON 12/16/2021] levothyroxine  75 mcg Intravenous Daily   sodium bicarbonate  650 mg Oral BID   sodium zirconium cyclosilicate  10 g Oral Daily   Continuous Infusions:  sodium chloride 75 mL/hr at 12/15/21 1052    Procedures/Studies: US RENAL  Result Date: 12/14/2021 CLINICAL DATA:  Acute renal failure EXAM: RENAL / URINARY TRACT ULTRASOUND COMPLETE COMPARISON:  Renal ultrasound dated July 03, 2021. FINDINGS: Right Kidney: Renal measurements: 8.8 x 6.7 x 6.5 cm = volume: 198.6 mL. Solid mass of the lower pole of the right kidney measuring 6.3 x 5.6 x 6.2 cm, previously 6.1 x 5.7 x 6.3 cm. Increased parenchymal echogenicity. No hydronephrosis visualized. Left Kidney: Renal measurements: 8.2 x 4.2 x 4.2 cm = volume: 75.6 mL. Atrophic. Echogenicity within normal limits. Increased parenchymal echogenicity. Bladder: Bladder is not visualized due to overlying bowel gas. Other: None. IMPRESSION: 1.  Solid mass of the lower pole of the right kidney, highly concerning for RCC, size is similar to prior ultrasound. 2. Increased parenchymal echogenicity, findings can be seen in the setting of medical renal disease. 3. No hydronephrosis. Electronically Signed   By: Yetta Glassman M.D.   On: 12/14/2021 09:02   DG Chest 1 View  Result Date: 12/13/2021 CLINICAL DATA:  Status post fall from bed. EXAM: CHEST  1 VIEW COMPARISON:  Chest radiograph 10/29/2020 FINDINGS: Monitoring leads overlie the [facility]. Cardiac contours are prominent. Bilateral interstitial pulmonary opacities. Probable small layering left pleural effusion. No definite pneumothorax. IMPRESSION: Cardiomegaly. Possible interstitial edema. Small layering left pleural effusion. Electronically Signed   By: Lovey Newcomer M.D.   On: 12/13/2021 17:41   CT Head Wo Contrast  Result Date: 12/13/2021 CLINICAL DATA:  Altered mental status.  Erin Jordan is status post fall. EXAM: CT HEAD WITHOUT CONTRAST  TECHNIQUE: Contiguous axial images were obtained from the base of the skull through the vertex without intravenous contrast. RADIATION DOSE REDUCTION: This exam was performed according to the departmental dose-optimization program which includes automated exposure control, adjustment of the mA and/or kV according to [facility] size and/or use of iterative reconstruction technique. COMPARISON:  Brain CT 01/08/2021 FINDINGS: Brain: Ventricles and sulci are prominent. Periventricular and subcortical white matter hypodensities compatible with chronic microvascular ischemic changes. No evidence for acute cortically based infarct, intracranial hemorrhage, mass lesion or mass-effect. Vascular: Unremarkable Skull: Intact. Sinuses/Orbits: Paranasal sinuses are well aerated. Mastoid air cells are unremarkable. Other: Soft tissue swelling overlying the left frontal calvarium. IMPRESSION: No acute intracranial process. Soft tissue swelling overlying the left frontal calvarium. Electronically Signed   By: Lovey Newcomer M.D.   On: 12/13/2021 17:38    Orson Eva, DO  [facility]  If 7PM-7AM, please contact night-coverage [email or web address] Password [email or web address] 12/15/2021, 4:31 PM   LOS: 1 day

## 2021-12-16 DIAGNOSIS — D649 Anemia, unspecified: Secondary | ICD-10-CM | POA: Diagnosis not present

## 2021-12-16 DIAGNOSIS — F0390 Unspecified dementia without behavioral disturbance: Secondary | ICD-10-CM | POA: Diagnosis not present

## 2021-12-16 DIAGNOSIS — N179 Acute kidney failure, unspecified: Secondary | ICD-10-CM | POA: Diagnosis not present

## 2021-12-16 DIAGNOSIS — G9341 Metabolic encephalopathy: Secondary | ICD-10-CM | POA: Diagnosis not present

## 2021-12-16 LAB — BASIC METABOLIC PANEL
Anion gap: 8 (ref 5–15)
BUN: 69 mg/dL — ABNORMAL HIGH (ref 8–23)
CO2: 20 mmol/L — ABNORMAL LOW (ref 22–32)
Calcium: 7.7 mg/dL — ABNORMAL LOW (ref 8.9–10.3)
Chloride: 113 mmol/L — ABNORMAL HIGH (ref 98–111)
Creatinine, Ser: 7.74 mg/dL — ABNORMAL HIGH (ref 0.44–1.00)
GFR, Estimated: 5 mL/min — ABNORMAL LOW (ref >60)
Glucose, Bld: 89 mg/dL (ref 70–99)
Potassium: 4.8 mmol/L (ref 3.5–5.1)
Sodium: 141 mmol/L (ref 135–145)

## 2021-12-16 LAB — MAGNESIUM: Magnesium: 2.5 mg/dL — ABNORMAL HIGH (ref 1.7–2.4)

## 2021-12-16 LAB — GLUCOSE, CAPILLARY: Glucose-Capillary: 73 mg/dL (ref 70–99)

## 2021-12-16 MED ORDER — SODIUM CHLORIDE 0.45 % IV SOLN: INTRAVENOUS | Status: DC

## 2021-12-16 NOTE — Progress Notes (Signed)
PROGRESS NOTE  Erin Jordan QJJ:[id number] DOB: Apr 03, 1946 DOA: 12/13/2021 PCP: [name], FNP  Brief History:  76 year old female with history of dementia, CKD stage IV, coronary disease, hypertension, diabetes mellitus type 2, renal cell carcinoma presenting with altered mental status.  The patient has been residing at [facility] for a little over 2 months.  Patient is unable to provide any history.  History is obtained from speaking with the patient's son.  Apparently, the patient was able to ambulate prior to her residence at [facility].  However in the past month, the patient has experienced a functional and cognitive decline.  At baseline, the patient has some pleasant confusion, but in the past month, the patient has been more confused and having decreased oral intake.  Apparently the patient has had numerous falls in the past 3 to 4 weeks resulting in functional decline.  She has been less communicative and more confused according to the son.  She has not been following any new medications.  There have been no reports of fevers, chills, chest pain, shortness breath, nausea, vomiting, diarrhea. Apparently, the patient sustained a fall around 11/20/2021.  She went to [facility].  Apparently she had a CT and MRI of the brain which were unremarkable. In the ED, the patient was afebrile hemodynamically stable with oxygen saturation 99% room air. BMP showed sodium 144, potassium 5.5, serum creatinine 2.28.  WBC 5.1, hemoglobin 5.4, platelets 161,000.  Patient was transfused 2 units PRBC.  Renal was consulted to assist with management.  FOBT was positive.     Assessment and Plan: * Acute renal failure superimposed on stage 4 chronic kidney disease, unspecified acute renal failure type (HCC) Baseline creatinine 2.9-3.3 Presented with serum creatinine 8.28 Continue judicious IV fluids Appreciate nephrology consultation--discussed with Dr. Candiss Norse Discussed with patient's  son (HPOA) possibility of HD >>we discussed that pt is not a good candidate given her comorbidities, dementia and poor baseline function and that dialysis would not change her overall trajectory or outcome >>12/15/21>>spoke with son>>do NOT want to place patient on HD>>optimize patient as much as possible for d/c home with palliative/hospice care  Anemia Hgb 5.5.  Baseline about 10-12.  Stool occult positive.  Not on anticoagulation antiplatelet. -2 units PRBC ordered -no signs of active blood loss presently -Check anemia panel>>> B12 1406, folate 3.4 -----iron sat 12, ferritin 481   Acute metabolic encephalopathy Multifactorial including acute on chronic renal failure, acute blood loss anemia, volume depletion,  hypothyroidism E56 3149 Folic acid 7.0>>YOVZCHY Check TSH 193.06, Free T4 0.51>>start IV repletion Check ammonia--21 Check VBG 7,29/34/45/16 --remains encephalopathic but improving with IVF  Hyperkalemia Continue daily Lokelma Continue bicarbonate A.m. BMP  Renal cell carcinoma ([place]) - Most recent visit with Dr. Alyson Ingles was on 01/09/2021, who ordered renal ultrasound and was planning for follow-up visit in 6 months if no major changes.  However, renal ultrasound (02/05/2021) showed an increase in the size of the renal mass from 3 cm to 5 cm. --Patient will need to follow-up with urology in outpatient setting -- Currently not a good surgical candidate. -- Has not felt IR to be an ideal candidate for curative cryoablation  Dementia without behavioral disturbance ([place]) At baseline the patient was pleasantly confused and able to ambulate prior to November 20, 2021 She has declined functionally and cognitively since that period of time  Essential hypertension Systolic 850 - 277.  Lasix 20 mg daily listed on med  list.  Discontinue furosemide     Family Communication: family updated 9/10   Consultants:  renal   Code Status:  DNR   DVT Prophylaxis:  SCDs      Procedures: As Listed in Progress Note Above   Antibiotics: None       Subjective: Pt is awake and alert.  Denies cp, sob, abd pain.  Remainder ROS unobtainable  Objective: Vitals:   12/15/21 0500 12/15/21 1313 12/15/21 2154 12/16/21 0433  BP: (!) 161/62 (!) 154/68 (!) 164/91 (!) 165/82  Pulse: 68 67 69 63  Resp:  '13 20 16  '$ Temp: 97.8 F (36.6 C) 98.2 F (36.8 C) 97.9 F (36.6 C) (!) 97.5 F (36.4 C)  TempSrc: Tympanic Oral    SpO2: 100% 99% 100% 100%    Intake/Output Summary (Last 24 hours) at 12/16/2021 1242 Last data filed at 12/16/2021 0900 Gross per 24 hour  Intake 1703.73 ml  Output 950 ml  Net 753.73 ml   Weight change:  Exam:  General:  Pt is alert, follows commands appropriately, not in acute distress HEENT: No icterus, No thrush, No neck mass, [place]/AT Cardiovascular: RRR, S1/S2, no rubs, no gallops Respiratory: bibasilar rales. No wheeze Abdomen: Soft/+BS, non tender, non distended, no guarding Extremities: No edema, No lymphangitis, No petechiae, No rashes, no synovitis   Data Reviewed: I have personally reviewed following labs and imaging studies Basic Metabolic Panel: Recent Labs  Lab 12/13/21 1526 12/14/21 0311 12/15/21 0524 12/16/21 0949  NA 144 145 143 141  K 5.5* 5.5* 5.1 4.8  CL 119* 117* 119* 113*  CO2 19* 20* 18* 20*  GLUCOSE 90 73 76 89  BUN 77* 76* 74* 69*  CREATININE 8.28* 7.98* 7.88* 7.74*  CALCIUM 7.4* 7.7* 7.4* 7.7*  MG  --   --   --  2.5*  PHOS  --   --  6.2*  --    Liver Function Tests: Recent Labs  Lab 12/13/21 1526 12/15/21 0524  AST 37  --   ALT 31  --   ALKPHOS 104  --   BILITOT 0.6  --   PROT 5.9*  --   ALBUMIN 2.9* 2.8*   No results for input(s): "LIPASE", "AMYLASE" in the last 168 hours. Recent Labs  Lab 12/14/21 1328  AMMONIA 21   Coagulation Profile: Recent Labs  Lab 12/13/21 1602  INR 1.2   CBC: Recent Labs  Lab 12/13/21 1526 12/14/21 0311 12/15/21 0524  WBC 5.1 7.3 6.0  NEUTROABS 3.8   --   --   HGB 5.4* 9.1* 8.5*  HCT 18.1* 29.6* 27.2*  MCV 105.2* 99.7 99.3  PLT 161 191 175   Cardiac Enzymes: No results for input(s): "CKTOTAL", "CKMB", "CKMBINDEX", "TROPONINI" in the last 168 hours. BNP: Invalid input(s): "POCBNP" CBG: Recent Labs  Lab 12/13/21 1452 12/13/21 2213 12/15/21 0503 12/15/21 0531 12/16/21 0427  GLUCAP 82 74 68* 75 73   HbA1C: Recent Labs    12/14/21 1328  HGBA1C 5.1   Urine analysis:    Component Value Date/Time   COLORURINE YELLOW 12/13/2021 1630   APPEARANCEUR CLEAR 12/13/2021 1630   APPEARANCEUR Hazy (A) 01/09/2021 1634   LABSPEC 1.015 12/13/2021 1630   PHURINE 7.0 12/13/2021 1630   GLUCOSEU NEGATIVE 12/13/2021 1630   HGBUR NEGATIVE 12/13/2021 1630   BILIRUBINUR NEGATIVE 12/13/2021 1630   BILIRUBINUR Negative 01/09/2021 1634   KETONESUR NEGATIVE 12/13/2021 1630   PROTEINUR >=300 (A) 12/13/2021 1630   UROBILINOGEN 0.2 05/19/2019 0947   UROBILINOGEN 0.2  06/01/2011 1822   NITRITE NEGATIVE 12/13/2021 1630   LEUKOCYTESUR NEGATIVE 12/13/2021 1630   Sepsis Labs: '@LABRCNTIP'$ (procalcitonin:4,lacticidven:4) ) Recent Results (from the past 240 hour(s))  Resp Panel by RT-PCR (Flu A&B, Covid) Anterior Nasal Swab     Status: None   Collection Time: 12/13/21  3:05 PM   Specimen: Anterior Nasal Swab  Result Value Ref Range Status   SARS Coronavirus 2 by RT PCR NEGATIVE NEGATIVE Final    Comment: (NOTE) SARS-CoV-2 target nucleic acids are NOT DETECTED.  The SARS-CoV-2 RNA is generally detectable in upper respiratory specimens during the acute phase of infection. The lowest concentration of SARS-CoV-2 viral copies this assay can detect is 138 copies/mL. A negative result does not preclude SARS-Cov-2 infection and should not be used as the sole basis for treatment or other patient management decisions. A negative result may occur with  improper specimen collection/handling, submission of specimen other than nasopharyngeal swab, presence of  viral mutation(s) within the areas targeted by this assay, and inadequate number of viral copies(<138 copies/mL). A negative result must be combined with clinical observations, patient history, and epidemiological information. The expected result is Negative.  Fact Sheet for Patients:  EntrepreneurPulse.com.au  Fact Sheet for Healthcare Providers:  IncredibleEmployment.be  This test is no t yet approved or cleared by the [place] FDA and  has been authorized for detection and/or diagnosis of SARS-CoV-2 by FDA under an Emergency Use Authorization (EUA). This EUA will remain  in effect (meaning this test can be used) for the duration of the COVID-19 declaration under Section 564(b)(1) of the Act, 21 U.S.C.section 360bbb-3(b)(1), unless the authorization is terminated  or revoked sooner.       Influenza A by PCR NEGATIVE NEGATIVE Final   Influenza B by PCR NEGATIVE NEGATIVE Final    Comment: (NOTE) The Xpert Xpress SARS-CoV-2/FLU/RSV plus assay is intended as an aid in the diagnosis of influenza from Nasopharyngeal swab specimens and should not be used as a sole basis for treatment. Nasal washings and aspirates are unacceptable for Xpert Xpress SARS-CoV-2/FLU/RSV testing.  Fact Sheet for Patients: EntrepreneurPulse.com.au  Fact Sheet for Healthcare Providers: IncredibleEmployment.be  This test is not yet approved or cleared by the [place] FDA and has been authorized for detection and/or diagnosis of SARS-CoV-2 by FDA under an Emergency Use Authorization (EUA). This EUA will remain in effect (meaning this test can be used) for the duration of the COVID-19 declaration under Section 564(b)(1) of the Act, 21 U.S.C. section 360bbb-3(b)(1), unless the authorization is terminated or revoked.  Performed at [facility], [address], [place], [place] [address]      Scheduled Meds:  Chlorhexidine  Gluconate Cloth  6 each Topical Daily   folic acid  1 mg Intravenous Daily   levothyroxine  75 mcg Intravenous Daily   sodium bicarbonate  650 mg Oral BID   sodium zirconium cyclosilicate  10 g Oral Daily   Continuous Infusions:  sodium chloride 100 mL/hr at 12/16/21 1230    Procedures/Studies: US RENAL  Result Date: 12/14/2021 CLINICAL DATA:  Acute renal failure EXAM: RENAL / URINARY TRACT ULTRASOUND COMPLETE COMPARISON:  Renal ultrasound dated July 03, 2021. FINDINGS: Right Kidney: Renal measurements: 8.8 x 6.7 x 6.5 cm = volume: 198.6 mL. Solid mass of the lower pole of the right kidney measuring 6.3 x 5.6 x 6.2 cm, previously 6.1 x 5.7 x 6.3 cm. Increased parenchymal echogenicity. No hydronephrosis visualized. Left Kidney: Renal measurements: 8.2 x 4.2 x 4.2 cm = volume: 75.6  mL. Atrophic. Echogenicity within normal limits. Increased parenchymal echogenicity. Bladder: Bladder is not visualized due to overlying bowel gas. Other: None. IMPRESSION: 1. Solid mass of the lower pole of the right kidney, highly concerning for RCC, size is similar to prior ultrasound. 2. Increased parenchymal echogenicity, findings can be seen in the setting of medical renal disease. 3. No hydronephrosis. Electronically Signed   By: Yetta Glassman M.D.   On: 12/14/2021 09:02   DG Chest 1 View  Result Date: 12/13/2021 CLINICAL DATA:  Status post fall from bed. EXAM: CHEST  1 VIEW COMPARISON:  Chest radiograph 10/29/2020 FINDINGS: Monitoring leads overlie the patient. Cardiac contours are prominent. Bilateral interstitial pulmonary opacities. Probable small layering left pleural effusion. No definite pneumothorax. IMPRESSION: Cardiomegaly. Possible interstitial edema. Small layering left pleural effusion. Electronically Signed   By: Lovey Newcomer M.D.   On: 12/13/2021 17:41   CT Head Wo Contrast  Result Date: 12/13/2021 CLINICAL DATA:  Altered mental status.  Patient is status post fall. EXAM: CT HEAD WITHOUT CONTRAST  TECHNIQUE: Contiguous axial images were obtained from the base of the skull through the vertex without intravenous contrast. RADIATION DOSE REDUCTION: This exam was performed according to the departmental dose-optimization program which includes automated exposure control, adjustment of the mA and/or kV according to patient size and/or use of iterative reconstruction technique. COMPARISON:  Brain CT 01/08/2021 FINDINGS: Brain: Ventricles and sulci are prominent. Periventricular and subcortical white matter hypodensities compatible with chronic microvascular ischemic changes. No evidence for acute cortically based infarct, intracranial hemorrhage, mass lesion or mass-effect. Vascular: Unremarkable Skull: Intact. Sinuses/Orbits: Paranasal sinuses are well aerated. Mastoid air cells are unremarkable. Other: Soft tissue swelling overlying the left frontal calvarium. IMPRESSION: No acute intracranial process. Soft tissue swelling overlying the left frontal calvarium. Electronically Signed   By: Lovey Newcomer M.D.   On: 12/13/2021 17:38    Orson Eva, DO  [facility]  If 7PM-7AM, please contact night-coverage [email or web address] Password [email or web address] 12/16/2021, 12:42 PM   LOS: 2 days

## 2021-12-17 ENCOUNTER — Emergency Department (HOSPITAL_COMMUNITY)
Admission: EM | Admit: 2021-12-17 | Discharge: 2021-12-28 | Disposition: A | Discharge status: Home / Self Care | Payer: Medicare Other | Attending: Emergency Medicine | Admitting: Emergency Medicine

## 2021-12-17 ENCOUNTER — Other Ambulatory Visit: Payer: Self-pay

## 2021-12-17 ENCOUNTER — Encounter (HOSPITAL_COMMUNITY): Payer: Self-pay | Admitting: Internal Medicine

## 2021-12-17 DIAGNOSIS — C649 Malignant neoplasm of unspecified kidney, except renal pelvis: Secondary | ICD-10-CM | POA: Diagnosis not present

## 2021-12-17 DIAGNOSIS — Z515 Encounter for palliative care: Secondary | ICD-10-CM | POA: Diagnosis not present

## 2021-12-17 DIAGNOSIS — F0390 Unspecified dementia without behavioral disturbance: Secondary | ICD-10-CM | POA: Insufficient documentation

## 2021-12-17 DIAGNOSIS — G9341 Metabolic encephalopathy: Secondary | ICD-10-CM | POA: Diagnosis not present

## 2021-12-17 DIAGNOSIS — N179 Acute kidney failure, unspecified: Secondary | ICD-10-CM | POA: Diagnosis not present

## 2021-12-17 DIAGNOSIS — N184 Chronic kidney disease, stage 4 (severe): Secondary | ICD-10-CM | POA: Diagnosis not present

## 2021-12-17 DIAGNOSIS — Z79899 Other long term (current) drug therapy: Secondary | ICD-10-CM | POA: Insufficient documentation

## 2021-12-17 LAB — BASIC METABOLIC PANEL
Anion gap: 6 (ref 5–15)
BUN: 67 mg/dL — ABNORMAL HIGH (ref 8–23)
CO2: 18 mmol/L — ABNORMAL LOW (ref 22–32)
Calcium: 7.3 mg/dL — ABNORMAL LOW (ref 8.9–10.3)
Chloride: 114 mmol/L — ABNORMAL HIGH (ref 98–111)
Creatinine, Ser: 7.18 mg/dL — ABNORMAL HIGH (ref 0.44–1.00)
GFR, Estimated: 5 mL/min — ABNORMAL LOW (ref >60)
Glucose, Bld: 79 mg/dL (ref 70–99)
Potassium: 4.5 mmol/L (ref 3.5–5.1)
Sodium: 138 mmol/L (ref 135–145)

## 2021-12-17 LAB — GLUCOSE, CAPILLARY: Glucose-Capillary: 76 mg/dL (ref 70–99)

## 2021-12-17 NOTE — Progress Notes (Signed)
Subjective:  no significant events overnight, has mittens on but able to participate in pleasant conversation, denies pain-  made a liter of urine- crt continues to trend down  Objective Vital signs in last 24 hours: Vitals:   12/16/21 0433 12/16/21 1315 12/16/21 2155 12/17/21 0411  BP: (!) 165/82 (!) 152/96 106/76 (!) 142/73  Pulse: 63 92 70 63  Resp: '16 16 20 19  '$ Temp: (!) 97.5 F (36.4 C) 98.2 F (36.8 C) 97.8 F (36.6 C) 97.6 F (36.4 C)  TempSrc:   Oral   SpO2: 100% 97% 100% 100%   Weight change:   Intake/Output Summary (Last 24 hours) at 12/17/2021 0855 Last data filed at 12/17/2021 0700 Gross per 24 hour  Intake 1428.73 ml  Output 1050 ml  Net 378.73 ml    Assessment/ Plan: Pt is a 76 y.o. yo female who was admitted on 12/13/2021 with decreased MS  Assessment/Plan: 1. 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 CKD-  has at baseline due to DM and s/p nephrectomy-  also with what is felt to be RCC on the remaining kidney.  No absolute indications for dialysis but agree with Dr. Ledell Noss assessment that she is not a candidate due to dementia and comorbids 3. Anemia-  initially hgb in the 5's-  given blood this admit-  above 8 at present  4. HTN/volume-  has been given fluids but now has dep pitting edema so will stop them  5. Hyperkalemia-  given lokelma-  last 2 days under 5 will stop -  also on sodium bicarb 6. Dispo -  I agree with palliative care involvement-  unfortunately I feel life expectancy is not long-  possibly looking to send home with hospice   Renal will sign off - call with any questions  Louis Meckel    Labs: Basic Metabolic Panel: Recent Labs  Lab 12/15/21 0524 12/16/21 0949 12/17/21 0521  NA 143 141 138  K 5.1 4.8  4.5  CL 119* 113* 114*  CO2 18* 20* 18*  GLUCOSE 76 89 79  BUN 74* 69* 67*  CREATININE 7.88* 7.74* 7.18*  CALCIUM 7.4* 7.7* 7.3*  PHOS 6.2*  --   --    Liver Function Tests: Recent Labs  Lab 12/13/21 1526 12/15/21 0524  AST 37  --   ALT 31  --   ALKPHOS 104  --   BILITOT 0.6  --   PROT 5.9*  --   ALBUMIN 2.9* 2.8*   No results for input(s): "LIPASE", "AMYLASE" in the last 168 hours. Recent Labs  Lab 12/14/21 1328  AMMONIA 21   CBC: Recent Labs  Lab 12/13/21 1526 12/14/21 0311 12/15/21 0524  WBC 5.1 7.3 6.0  NEUTROABS 3.8  --   --   HGB 5.4* 9.1* 8.5*  HCT 18.1* 29.6* 27.2*  MCV 105.2* 99.7 99.3  PLT 161 191 175   Cardiac Enzymes: No results for input(s): "CKTOTAL", "CKMB", "CKMBINDEX", "TROPONINI" in the last 168 hours. CBG: Recent Labs  Lab 12/13/21 2213 12/15/21 0503 12/15/21 0531 12/16/21 0427 12/17/21 0456  GLUCAP 74 68* 75 73 76    Iron Studies: No results for input(s): "IRON", "TIBC", "TRANSFERRIN", "FERRITIN" in the last 72 hours. Studies/Results: No results found. Medications: Infusions:  sodium  chloride 100 mL/hr at 12/16/21 1230    Scheduled Medications:  Chlorhexidine Gluconate Cloth  6 each Topical Daily   folic acid  1 mg Intravenous Daily   levothyroxine  75 mcg Intravenous Daily   sodium bicarbonate  650 mg Oral BID   sodium zirconium cyclosilicate  10 g Oral Daily    have reviewed scheduled and prn medications.  Physical Exam: General: confused, mittens on and arm wrapped for IV Heart: RRR Lungs: dec BS at bases Abdomen: soft, non tender Extremities: pitting dependent edema     12/17/2021,8:55 AM  LOS: 3 days

## 2021-12-17 NOTE — Progress Notes (Signed)
Pt has discharge orders, discharge instructions given to daughter Joseph Art over the phone, I verified with daughter if she wanted pt to return home before equipment was delivered to home and she confirmed her mom can come home prior to the equipment arriving. EMS arrived to take pt to residency in [place]. Daughter Joseph Art called by this nurse to let her know of transport upon her wishes, no answer, voicemail left to make her aware.

## 2021-12-17 NOTE — ED Triage Notes (Signed)
Patient brought in by [facility] due to the patient being discharged home with hospice and family was not at the residence to accept patient when EMS arrived. Per EMS family was contacted and did not answer when patient was at the address provided by a daughter. Patient has been with EMS since discharge at 2:30pm today. Patient has hx of dementia and cannot be left alone.

## 2021-12-17 NOTE — TOC Transition Note (Signed)
Transition of Care [facility]) - CM/SW Discharge Note   Patient Details  Name: Erin Jordan MRN: [id number] Date of Birth: 1945-07-30  [facility] [place] CM/SW Contact:  Ihor Gully, LCSW Phone Number: 12/17/2021, 12:41 PM   Clinical Narrative:    Per attending family is agreeable to hospice services in the [place] and desire for referral to be made to [facility]. Referral made with Francis Dowse with [facility].    Final next level of care: Home w Hospice Care Barriers to Discharge: No Barriers Identified   Patient Goals and CMS Choice Patient states their goals for this hospitalization and ongoing recovery are:: d/c to youngest daughter's house      Discharge Placement                       Discharge Plan and Services                            [place]: Other - See comment, [place] ([place]) Date [place]: 12/17/21 Time [place]: [id number] Representative spoke with at [place]: [place] ([place]) Interventions     Readmission Risk Interventions    03/30/2020    1:10 PM 03/29/2020    4:18 PM  Readmission Risk Prevention Plan  Transportation Screening Complete Complete  PCP or Specialist Appt within 5-7 Days  Not Complete  PCP or Specialist Appt within 3-5 Days Complete   Home Care Screening  Complete  Medication Review (RN CM)  Complete  HRI or Home Care Consult Complete   Social Work Consult for [place] Planning/Counseling Complete   Palliative Care Screening Not Applicable   Medication Review Press photographer) Complete

## 2021-12-17 NOTE — Care Management Important Message (Signed)
Important Message  Patient Details  Name: Erin Jordan MRN: [id number] Date of Birth: 06/21/1945   Medicare Important Message Given:  Yes (copy left in room)     Tommy Medal 12/17/2021, 12:28 PM

## 2021-12-17 NOTE — ED Provider Notes (Signed)
[facility] EMERGENCY DEPARTMENT Provider Note   CSN: [id number] Arrival date & time: 12/17/21  1736     History  Chief Complaint  Patient presents with   Dementia   Level 5 caveat: Dementia Erin Jordan is a 76 y.o. female with a past medical history who presents to the emergency department brought in by EMS rescue.  Patient was discharged from the hospital with plans to be brought to patient's daughter's home [place]).  EMS contacted family when they arrived to the house however there was no contact made with family.  EMS brought the patient to the emergency department due to being unable to drop the patient off at home safely in the care of family members.  Patient denies chest pain, shortness of breath, abdominal pain, nausea, vomiting.  The history is provided by the patient and a relative. No language interpreter was used.       Home Medications Prior to Admission medications   Medication Sig Start Date End Date Taking? Authorizing Provider  acetaminophen (TYLENOL) 325 MG tablet Take 650 mg by mouth 2 (two) times daily.    [provider]  levothyroxine (SYNTHROID) 150 MCG tablet Take 150 mcg by mouth daily. 11/07/21   [provider]  Morphine Sulfate (MORPHINE CONCENTRATE) 10 mg / 0.5 ml concentrated solution Take 5 mg by mouth every 4 (four) hours as needed for shortness of breath or moderate pain. 12/07/21   [provider]  OXYGEN Inhale 2 L into the lungs daily as needed (shortness of breath).    [provider]  traZODone (DESYREL) 50 MG tablet TAKE (1) TABLET BY MOUTH AT BEDTIME. Patient taking differently: Take 50 mg by mouth at bedtime. 05/25/21   [name], FNP      Allergies    Patient has no allergy information on record.    Review of Systems   Review of Systems  Unable to perform ROS: Dementia    Physical Exam Updated Vital Signs BP (!) 159/110 (BP Location: Left Arm)   Pulse (!) 58   Resp 18   Ht '5\' 6"'$   (1.676 m)   Wt 68 kg   BMI 24.21 kg/m  Physical Exam Vitals and nursing note reviewed.  Constitutional:      General: She is not in acute distress.    Appearance: She is not diaphoretic.  HENT:     Head: Normocephalic and atraumatic.     Mouth/Throat:     Pharynx: No oropharyngeal exudate.  Eyes:     General: No scleral icterus.    Conjunctiva/sclera: Conjunctivae normal.  Cardiovascular:     Rate and Rhythm: Normal rate and regular rhythm.     Pulses: Normal pulses.     Heart sounds: Normal heart sounds.  Pulmonary:     Effort: Pulmonary effort is normal. No respiratory distress.     Breath sounds: Normal breath sounds. No wheezing.  Abdominal:     General: Bowel sounds are normal.     Palpations: Abdomen is soft. There is no mass.     Tenderness: There is no abdominal tenderness. There is no guarding or rebound.  Musculoskeletal:        General: Normal range of motion.     Cervical back: Normal range of motion and neck supple.  Skin:    General: Skin is warm and dry.  Neurological:     Mental Status: She is alert.  Psychiatric:        Behavior: Behavior normal.  ED Results / Procedures / Treatments   Labs (all labs ordered are listed, but only abnormal results are displayed) Labs Reviewed - No data to display  EKG None  Radiology No results found.  Procedures Procedures    Medications Ordered in ED Medications - No data to display  ED Course/ Medical Decision Making/ A&P Clinical Course as of 12/17/21 2224  Mon Dec 17, 2021  2016 Spoke with patient's daughter, Delray Alt who notes that patient was unable to stay at her home due to her landlord not approving patient for home.  Notes that she is waiting to hear from the landlord to see if he changes mind on that.  If not then patient will go with the niece who will take the patient down.  Discussed with patient family member that social worker may get involved in the morning to ensure that patient has  a game plan for her to go.  Family agreeable at this time. [SB]    Clinical Course User Index [SB] Juli Odom A, PA-C                           Medical Decision Making  Pt presents to the ED brought in by EMS because patient was discharged today and was supposed to be brought to family members home.  However daughter was not at the home upon EMS arrival.  Daughter also noted that her landlord did not approve of patient coming and staying with her daughter.  Daughter notes that she is working on this and hopefully will have approval from her landlord in the morning.  If not then daughter notes that patient will be going home with another family member in the morning.  Patient afebrile.  On exam, pt with no acute cardiovascular, respiratory, abdominal exam findings.    Co morbidities that complicate the patient evaluation: Hypertension, dementia,  Additional history obtained:  Additional history obtained from Daughter/Son External records from outside source obtained and reviewed including: Patient was admitted to the hospital 12/13/2021 for acute renal failure, anemia, altered mental status and discharged today.  Plan was to have patient discharged home with daughter.  Patient case discussed with Dr. Dina Rich, at sign-out. Plan at sign-out is pending social worker consult, likely Discharge home with family in the AM, however, plans may change as per oncoming team. Patient care transferred at sign out.     This chart was dictated using voice recognition software, Dragon. Despite the best efforts of this provider to proofread and correct errors, errors may still occur which can change documentation meaning.   Final Clinical Impression(s) / ED Diagnoses Final diagnoses:  Dementia without behavioral disturbance, psychotic disturbance, mood disturbance, or anxiety, unspecified dementia severity, unspecified dementia type [facility])    Rx / DC Orders ED Discharge Orders     None          Shanyla Marconi A, PA-C 12/17/21 2317    Wyvonnia Dusky, MD 12/18/21 [phone]

## 2021-12-17 NOTE — Consult Note (Signed)
Consultation Note Date: 12/17/2021   Patient Name: Erin Jordan  DOB: 1945/07/04  MRN: [id number]  Age / Sex: 76 y.o., female  PCP: [name], FNP Referring Physician: Orson Eva, MD  Reason for Consultation: Establishing goals of care  HPI/Patient Profile: 76 y.o. female  with past medical history of dementia, CKD 4, HTN/HLD, DM, renal cell carcinoma with right nephrectomy, CABG in 2006 with PCI 2019, admitted on 12/13/2021 with acute renal failure on CKD 4, patient not a good candidate for HD, son agrees.   Clinical Assessment and Goals of Care: I have reviewed medical records including EPIC notes, labs and imaging, received report from RN, assessed the patient.  Mrs. Topor is lying quietly in bed.  She appears acutely/chronically ill and frail.  She has known dementia, but is able to tell me her name.  She tells me that she is sleepy.  She will open her eyes and briefly make and keep eye contact when requested.  I am not sure that she can make her basic needs known.  There is no family at bedside at this time.  Detail conference with transition of care team and bedside nursing staff.    Call to son, Jasmyn Picha.  No answer, unable to leave voicemail message.    At this time Mrs. Kneeland is is to discharge to her daughter's home with [facility] hospice services.    Advanced directives, concepts specific to code status, artifical feeding and hydration, and rehospitalization were considered and discussed.  Mrs. Weekes is DNR.  In ACP tab of epic chart she has [place] form dated 08/19/2019.  Hospice and Palliative Care services outpatient were set up by transition of care team.  Services to be provided by [facility] hospice.  Conference with transition of care team and bedside nursing staff related to patient condition, needs.  HCPOA  NEXT OF KIN -son, Alizah Sills.    SUMMARY OF  RECOMMENDATIONS   Home with the benefits of [facility] hospice   Code Status/Advance Care Planning: DNR   Symptom Management:  Per hospitalist, no additional needs at this time   Palliative Prophylaxis:  Frequent Pain Assessment, Oral Care, and Turn Reposition  Additional Recommendations (Limitations, Scope, Preferences): Continue to treat, no CPR or intubation   Psycho-social/Spiritual:  Desire for further Chaplaincy support:no Additional Recommendations: Caregiving  Support/Resources and Education on Hospice  Prognosis:  < 6 months or less would not be surprising based on chronic illness burden, decreasing functional status.   Discharge Planning: Home with Hospice      Primary Diagnoses: Present on Admission:  Renal cell carcinoma (HCC)  Essential hypertension  Dementia without behavioral disturbance (HCC)  Anemia  Acute metabolic encephalopathy  Acute renal failure superimposed on stage 4 chronic kidney disease, unspecified acute renal failure type ([place])   I have reviewed the medical record, interviewed the patient and family, and examined the patient. The following aspects are pertinent.  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   History of diabetes mellitus, type II    Hyperlipidemia    Hypothyroidism    NSTEMI (non-ST elevated myocardial infarction) ([place]) 06/24/2017   Pneumonia    hx of 2021    Renal hematoma    Right renal mass    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   Social History   Socioeconomic History   Marital status: Widowed    Spouse name: Not on file   Number of children: 6   Years of education: Not on file   Highest  education level: Not on file  Occupational History   Occupation: retired  Tobacco Use   Smoking status: Never   Smokeless tobacco: Never  Vaping Use   Vaping Use: Never used  Substance and Sexual Activity   Alcohol use: No   Drug use: No   Sexual activity: Never  Other Topics Concern   Not on file  Social History Narrative   Lives in [place] with her daughter and grandchildren.   She has dementia   Social Determinants of Health   Financial Resource Strain: Medium Risk (09/06/2020)   Overall Financial Resource Strain (CARDIA)    Difficulty of Paying Living Expenses: Somewhat hard  Food Insecurity: No Food Insecurity (09/06/2020)   Hunger Vital Sign    Worried About Running Out of Food in the Last Year: Never true    Ran Out of Food in the Last Year: Never true  Transportation Needs: No Transportation Needs (09/06/2020)   PRAPARE - Hydrologist (Medical): No    Lack of Transportation (Non-Medical): No  Physical Activity: Insufficiently Active (09/06/2020)   Exercise Vital Sign    Days of Exercise per Week: 7 days    Minutes of Exercise per Session: 20 min  Stress: No Stress Concern Present (09/06/2020)   [place]    Feeling of Stress : Only a little  Social Connections: Moderately Isolated (09/06/2020)   Social Connection and Isolation Panel [NHANES]    Frequency of Communication with Friends and Family: More than three times a week    Frequency of Social Gatherings with Friends and Family: More than three times a week    Attends Religious Services: 1 to 4 times per year    Active Member of [employer] or Organizations: No    Attends Archivist Meetings: Never    Marital Status: Widowed   Family History  Problem Relation Age of Onset   Heart attack Mother    Hypertension Mother    Seizures Son    Seizures Son    Seizures Daughter    Scheduled Meds:  Chlorhexidine Gluconate  Cloth  6 each Topical Daily   folic acid  1 mg Intravenous Daily   levothyroxine  75 mcg Intravenous Daily   sodium bicarbonate  650 mg Oral BID   Continuous Infusions: PRN Meds:.acetaminophen **OR** acetaminophen, ondansetron **OR** ondansetron (ZOFRAN) IV, polyethylene glycol Medications Prior to Admission:  Prior to Admission medications   Medication Sig Start Date End Date Taking? Authorizing Provider  acetaminophen (TYLENOL) 325 MG tablet Take 650 mg by mouth 2 (two) times daily.   Yes [provider]  diphenhydrAMINE (BENADRYL)  25 MG tablet Take 25 mg by mouth every 6 (six) hours as needed (Tongue and facial swelling).   Yes [provider]  furosemide (LASIX) 20 MG tablet Take 20 mg by mouth daily. 11/24/21  Yes [provider]  ipratropium-albuterol (DUONEB) 0.5-2.5 (3) MG/3ML SOLN Take 3 mLs by nebulization in the morning and at bedtime. 11/28/21  Yes [provider]  levothyroxine (SYNTHROID) 150 MCG tablet Take 150 mcg by mouth daily. 11/07/21  Yes [provider]  Morphine Sulfate (MORPHINE CONCENTRATE) 10 mg / 0.5 ml concentrated solution Take 5 mg by mouth every 4 (four) hours as needed for shortness of breath or moderate pain. 12/07/21  Yes [provider]  OXYGEN Inhale 2 L into the lungs daily as needed (shortness of breath).   Yes [provider]  traZODone (DESYREL) 50 MG tablet TAKE (1) TABLET BY MOUTH AT BEDTIME. Patient taking differently: Take 50 mg by mouth at bedtime. 05/25/21  Yes Hendricks Limes F, FNP  albuterol (VENTOLIN HFA) 108 (90 Base) MCG/ACT inhaler Inhale into the lungs. Patient not taking: Reported on 12/13/2021 11/23/21   [provider]  predniSONE (DELTASONE) 50 MG tablet Take 50 mg by mouth daily. Patient not taking: Reported on 12/13/2021 12/13/21   [provider]   Not on File Review of Systems  Unable to perform ROS: Dementia    Physical Exam Vitals and nursing note reviewed.   Constitutional:      General: She is not in acute distress.    Appearance: She is obese. She is ill-appearing.  HENT:     Mouth/Throat:     Mouth: Mucous membranes are moist.  Cardiovascular:     Rate and Rhythm: Normal rate.  Pulmonary:     Effort: Pulmonary effort is normal. No respiratory distress.  Abdominal:     Palpations: Abdomen is soft.     Tenderness: There is no abdominal tenderness.  Skin:    General: Skin is warm and dry.  Neurological:     Comments: Known dementia   Psychiatric:     Comments: Calm, not fearful      Vital Signs: BP (!) 142/73 (BP Location: Right Wrist)   Pulse 63   Temp 97.6 F (36.4 C)   Resp 19   SpO2 100%  Pain Scale: Faces   Pain Score: 0-No pain   SpO2: SpO2: 100 % O2 Device:SpO2: 100 % O2 Flow Rate: .   IO: Intake/output summary:  Intake/Output Summary (Last 24 hours) at 12/17/2021 1219 Last data filed at 12/17/2021 0900 Gross per 24 hour  Intake 1128.73 ml  Output 1050 ml  Net 78.73 ml    LBM: Last BM Date : 12/17/21 Baseline Weight:   Most recent weight:       Palliative Assessment/Data:   Flowsheet Rows    Flowsheet Row Most Recent Value  Intake Tab   Referral Department Hospitalist  Unit at Time of Referral Med/Surg Unit  Palliative Care Primary Diagnosis Nephrology  Date Notified 12/14/21  Palliative Care Type New Palliative care  Reason for referral Clarify Goals of Care  Date of Admission 12/13/21  Date first seen by Palliative Care 12/17/21  # of days Palliative referral response time 3 Day(s)  # of days IP prior to Palliative referral 1  Clinical Assessment   Palliative Performance Scale Score 30%  Pain Max last 24 hours Not able to report  Pain Min Last 24 hours Not able to report  Dyspnea Max Last 24 Hours Not  able to report  Dyspnea Min Last 24 hours Not able to report  Psychosocial & Spiritual Assessment   Palliative Care Outcomes        Time In: 0920 Time Out: 1000  Time Total: 40 minutes    Greater than 50%  of this time was spent counseling and coordinating care related to the above assessment and plan.  Signed by: Drue Novel, NP   Please contact Palliative Medicine Team phone at [phone] for questions and concerns.  For individual provider: See Shea Evans

## 2021-12-17 NOTE — Progress Notes (Signed)
APS worker Valeria Batman notified of patient's situation and refusal of family to take the patient at the home at [address] [place] as agreed upon by [facility], the daughter. APS is consulting with her team to assist.

## 2021-12-17 NOTE — Discharge Summary (Addendum)
Physician Discharge Summary   Patient: Erin Jordan MRN: [id number] DOB: Oct 11, 1945  Admit date:     12/13/2021  Discharge date: 12/17/21  Discharge Physician: Shanon Brow Keerthi Hazell   PCP: [name], FNP   Recommendations at discharge:  Discharge home with home hospice with focus on comfort measures    Hospital Course: 76 year old female with history of dementia, CKD stage IV, coronary disease, hypertension, diabetes mellitus type 2, renal cell carcinoma presenting with altered mental status.  The patient has been residing at [place] for a little over 2 months.  Patient is unable to provide any history.  History is obtained from speaking with the patient's son.  Apparently, the patient was able to ambulate prior to her residence at [place].  However in the past month, the patient has experienced a functional and cognitive decline.  At baseline, the patient has some pleasant confusion, but in the past month, the patient has been more confused and having decreased oral intake.  Apparently the patient has had numerous falls in the past 3 to 4 weeks resulting in functional decline.  She has been less communicative and more confused according to the son.  She has not been following any new medications.  There have been no reports of fevers, chills, chest pain, shortness breath, nausea, vomiting, diarrhea. Apparently, the patient sustained a fall around 11/20/2021.  She went to [facility].  Apparently she had a CT and MRI of the brain which were unremarkable. In the ED, the patient was afebrile hemodynamically stable with oxygen saturation 99% room air. BMP showed sodium 144, potassium 5.5, serum creatinine 2.28.  WBC 5.1, hemoglobin 5.4, platelets 161,000.  Patient was transfused 2 units PRBC.  Renal was consulted to assist with management.  FOBT was positive.  Her mental status showed a little improvement with IVF and IV synthroid, but she remained confused.  [place] discussions with family  were held.  Son, who is HPOA, decided against dialysis.  They wanted to take pt home with hospice and focus on comfort measures.  Assessment and Plan: * Acute renal failure superimposed on stage 4 chronic kidney disease, unspecified acute renal failure type (HCC) Baseline creatinine 2.9-3.3 Presented with serum creatinine 8.28 Continue judicious IV fluids Appreciate nephrology consultation--discussed with Dr. Candiss Norse -serum creatinine 7.18 on day of dc Discussed with patient's son (HPOA) possibility of HD >>we discussed that pt is not a good candidate given her comorbidities, dementia and poor baseline function and that dialysis would not change her overall trajectory or outcome >>12/15/21>>spoke with son>>do NOT want to place patient on HD>>optimize patient as much as possible for d/c home with palliative/hospice care >>12/16/21>>confirmed plan to d/c home with palliative/hospice care once DME is delivered   Anemia ACUTE BLOOD LOSS ANEMIA Hgb 5.5.  Baseline about 10-12.  Stool occult positive.  Not on anticoagulation antiplatelet. -2 units PRBC ordered -no signs of active blood loss presently -Check anemia panel>>> B12 1406, folate 3.4 -----iron sat 12, ferritin 194 Not candidate for aggressive intervention at this point   Acute metabolic encephalopathy Multifactorial including acute on chronic renal failure, acute blood loss anemia, volume depletion,  hypothyroidism Y18 5631 Folic acid 4.9>>FWYOVZC Check TSH 193.06, Free T4 0.51>>start IV repletion during hospitaliztion Check ammonia--21 Check VBG 7.29/34/45/16 --remains encephalopathic but improving with IVF  Hyperkalemia Continue daily [place] Continue bicarbonate A.m. BMP  Renal cell carcinoma ([place]) - Most recent visit with Dr. Alyson Ingles was on 01/09/2021, who ordered renal ultrasound and was planning for follow-up visit  in 6 months if no major changes.  However, renal ultrasound (02/05/2021) showed an increase in the size of  the renal mass from 3 cm to 5 cm. --Patient will need to follow-up with urology in outpatient setting -- Currently not a good surgical candidate. -- Has not felt IR to be an ideal candidate for curative cryoablation  Dementia without behavioral disturbance ([place]) At baseline the patient was pleasantly confused and able to ambulate prior to November 20, 2021 She has declined functionally and cognitively since that period of time  Essential hypertension Systolic 242 - 683.  Lasix 20 mg daily listed on med list.  Discontinue furosemide          Consultants: renal Procedures performed: none  Disposition: Home Diet recommendation:  Comfort feeding DISCHARGE MEDICATION: Allergies as of 12/17/2021   Not on File      Medication List     STOP taking these medications    albuterol 108 (90 Base) MCG/ACT inhaler Commonly known as: VENTOLIN HFA   diphenhydrAMINE 25 MG tablet Commonly known as: BENADRYL   furosemide 20 MG tablet Commonly known as: LASIX   ipratropium-albuterol 0.5-2.5 (3) MG/3ML Soln Commonly known as: DUONEB   predniSONE 50 MG tablet Commonly known as: DELTASONE       TAKE these medications    acetaminophen 325 MG tablet Commonly known as: TYLENOL Take 650 mg by mouth 2 (two) times daily.   levothyroxine 150 MCG tablet Commonly known as: SYNTHROID Take 150 mcg by mouth daily.   morphine CONCENTRATE 10 mg / 0.5 ml concentrated solution Take 5 mg by mouth every 4 (four) hours as needed for shortness of breath or moderate pain.   OXYGEN Inhale 2 L into the lungs daily as needed (shortness of breath).   traZODone 50 MG tablet Commonly known as: DESYREL TAKE (1) TABLET BY MOUTH AT BEDTIME. What changed: See the new instructions.        Discharge Exam: There were no vitals filed for this visit. HEENT:  [place]/AT, No thrush, no icterus CV:  RRR, no rub, no S3, no S4 Lung:  bibasilar rales Abd:  soft/+BS, NT Ext:  trace LE edema, no  lymphangitis, no synovitis, no rash   Condition at discharge: stable  The results of significant diagnostics from this hospitalization (including imaging, microbiology, ancillary and laboratory) are listed below for reference.   Imaging Studies: US RENAL  Result Date: 12/14/2021 CLINICAL DATA:  Acute renal failure EXAM: RENAL / URINARY TRACT ULTRASOUND COMPLETE COMPARISON:  Renal ultrasound dated July 03, 2021. FINDINGS: Right Kidney: Renal measurements: 8.8 x 6.7 x 6.5 cm = volume: 198.6 mL. Solid mass of the lower pole of the right kidney measuring 6.3 x 5.6 x 6.2 cm, previously 6.1 x 5.7 x 6.3 cm. Increased parenchymal echogenicity. No hydronephrosis visualized. Left Kidney: Renal measurements: 8.2 x 4.2 x 4.2 cm = volume: 75.6 mL. Atrophic. Echogenicity within normal limits. Increased parenchymal echogenicity. Bladder: Bladder is not visualized due to overlying bowel gas. Other: None. IMPRESSION: 1. Solid mass of the lower pole of the right kidney, highly concerning for RCC, size is similar to prior ultrasound. 2. Increased parenchymal echogenicity, findings can be seen in the setting of medical renal disease. 3. No hydronephrosis. Electronically Signed   By: Yetta Glassman M.D.   On: 12/14/2021 09:02   DG Chest 1 View  Result Date: 12/13/2021 CLINICAL DATA:  Status post fall from bed. EXAM: CHEST  1 VIEW COMPARISON:  Chest radiograph 10/29/2020 FINDINGS: Monitoring leads  overlie the patient. Cardiac contours are prominent. Bilateral interstitial pulmonary opacities. Probable small layering left pleural effusion. No definite pneumothorax. IMPRESSION: Cardiomegaly. Possible interstitial edema. Small layering left pleural effusion. Electronically Signed   By: Lovey Newcomer M.D.   On: 12/13/2021 17:41   CT Head Wo Contrast  Result Date: 12/13/2021 CLINICAL DATA:  Altered mental status.  Patient is status post fall. EXAM: CT HEAD WITHOUT CONTRAST TECHNIQUE: Contiguous axial images were obtained from  the base of the skull through the vertex without intravenous contrast. RADIATION DOSE REDUCTION: This exam was performed according to the departmental dose-optimization program which includes automated exposure control, adjustment of the mA and/or kV according to patient size and/or use of iterative reconstruction technique. COMPARISON:  Brain CT 01/08/2021 FINDINGS: Brain: Ventricles and sulci are prominent. Periventricular and subcortical white matter hypodensities compatible with chronic microvascular ischemic changes. No evidence for acute cortically based infarct, intracranial hemorrhage, mass lesion or mass-effect. Vascular: Unremarkable Skull: Intact. Sinuses/Orbits: Paranasal sinuses are well aerated. Mastoid air cells are unremarkable. Other: Soft tissue swelling overlying the left frontal calvarium. IMPRESSION: No acute intracranial process. Soft tissue swelling overlying the left frontal calvarium. Electronically Signed   By: Lovey Newcomer M.D.   On: 12/13/2021 17:38    Microbiology: Results for orders placed or performed during the hospital encounter of 12/13/21  Resp Panel by RT-PCR (Flu A&B, Covid) Anterior Nasal Swab     Status: None   Collection Time: 12/13/21  3:05 PM   Specimen: Anterior Nasal Swab  Result Value Ref Range Status   SARS Coronavirus 2 by RT PCR NEGATIVE NEGATIVE Final    Comment: (NOTE) SARS-CoV-2 target nucleic acids are NOT DETECTED.  The SARS-CoV-2 RNA is generally detectable in upper respiratory specimens during the acute phase of infection. The lowest concentration of SARS-CoV-2 viral copies this assay can detect is 138 copies/mL. A negative result does not preclude SARS-Cov-2 infection and should not be used as the sole basis for treatment or other patient management decisions. A negative result may occur with  improper specimen collection/handling, submission of specimen other than nasopharyngeal swab, presence of viral mutation(s) within the areas targeted  by this assay, and inadequate number of viral copies(<138 copies/mL). A negative result must be combined with clinical observations, patient history, and epidemiological information. The expected result is Negative.  Fact Sheet for Patients:  EntrepreneurPulse.com.au  Fact Sheet for Healthcare Providers:  IncredibleEmployment.be  This test is no t yet approved or cleared by the [place] FDA and  has been authorized for detection and/or diagnosis of SARS-CoV-2 by FDA under an Emergency Use Authorization (EUA). This EUA will remain  in effect (meaning this test can be used) for the duration of the COVID-19 declaration under Section 564(b)(1) of the Act, 21 U.S.C.section 360bbb-3(b)(1), unless the authorization is terminated  or revoked sooner.       Influenza A by PCR NEGATIVE NEGATIVE Final   Influenza B by PCR NEGATIVE NEGATIVE Final    Comment: (NOTE) The Xpert Xpress SARS-CoV-2/FLU/RSV plus assay is intended as an aid in the diagnosis of influenza from Nasopharyngeal swab specimens and should not be used as a sole basis for treatment. Nasal washings and aspirates are unacceptable for Xpert Xpress SARS-CoV-2/FLU/RSV testing.  Fact Sheet for Patients: EntrepreneurPulse.com.au  Fact Sheet for Healthcare Providers: IncredibleEmployment.be  This test is not yet approved or cleared by the [place] FDA and has been authorized for detection and/or diagnosis of SARS-CoV-2 by FDA under an Emergency Use Authorization (EUA). This  EUA will remain in effect (meaning this test can be used) for the duration of the COVID-19 declaration under Section 564(b)(1) of the Act, 21 U.S.C. section 360bbb-3(b)(1), unless the authorization is terminated or revoked.  Performed at [facility], [address] [place], [place] [address]     Labs: CBC: Recent Labs  Lab 12/13/21 1526 12/14/21 0311 12/15/21 0524   WBC 5.1 7.3 6.0  NEUTROABS 3.8  --   --   HGB 5.4* 9.1* 8.5*  HCT 18.1* 29.6* 27.2*  MCV 105.2* 99.7 99.3  PLT 161 191 801   Basic Metabolic Panel: Recent Labs  Lab 12/13/21 1526 12/14/21 0311 12/15/21 0524 12/16/21 0949 12/17/21 0521  NA 144 145 143 141 138  K 5.5* 5.5* 5.1 4.8 4.5  CL 119* 117* 119* 113* 114*  CO2 19* 20* 18* 20* 18*  GLUCOSE 90 73 76 89 79  BUN 77* 76* 74* 69* 67*  CREATININE 8.28* 7.98* 7.88* 7.74* 7.18*  CALCIUM 7.4* 7.7* 7.4* 7.7* 7.3*  MG  --   --   --  2.5*  --   PHOS  --   --  6.2*  --   --    Liver Function Tests: Recent Labs  Lab 12/13/21 1526 12/15/21 0524  AST 37  --   ALT 31  --   ALKPHOS 104  --   BILITOT 0.6  --   PROT 5.9*  --   ALBUMIN 2.9* 2.8*   CBG: Recent Labs  Lab 12/13/21 2213 12/15/21 0503 12/15/21 0531 12/16/21 0427 12/17/21 0456  GLUCAP 74 68* 75 73 76    Discharge time spent: greater than 30 minutes.  Signed: Orson Eva, MD [facility] 12/17/2021

## 2021-12-17 NOTE — ED Notes (Signed)
APS worker Valeria Batman notified [facility] that the case would be investigated tomorrow and the supervisors name is Jolene Schimke [phone] if any needs arise before then.

## 2021-12-17 NOTE — ED Notes (Signed)
Attempted to call daughter, Joseph Art, at this time without answer.

## 2021-12-18 ENCOUNTER — Other Ambulatory Visit: Payer: Self-pay

## 2021-12-18 NOTE — ED Provider Notes (Signed)
Emergency Medicine Observation Re-evaluation Note  Erin Jordan is a 76 y.o. female history of dementia, CKD stage IV, coronary disease, hypertension, diabetes mellitus type 2, renal cell carcinoma , seen on rounds today.  Pt initially presented to the ED for complaints of Dementia Currently, the patient is sleeping soundly.  Physical Exam  BP (!) 183/91   Pulse 63   Temp 98.4 F (36.9 C)   Resp 18   Ht '5\' 6"'$  (1.676 m)   Wt 68 kg   SpO2 100%   BMI 24.21 kg/m  Physical Exam General: Sleeping soundly Cardiac: Well-perfused Lungs: No increased work of breathing Psych: Calm, resting  ED Course / MDM  EKG:   I have reviewed the labs performed to date as well as medications administered while in observation.  Recent changes in the last 24 hours include patient was discharged yesterday after being admitted to the hospital from 12/13/2021 to 12/17/2021 for acute renal failure superimposed on CKD stage IV, acute blood loss anemia, metabolic encephalopathy.  Per documentation, mental status showed some improvement with IVF and IV Synthroid but she remained confused and goals of care discussions with the family were held.  Son who is HCPOA declined dialysis and they would rather take the patient home with hospice and focus on comfort measures.  Patient returned to the emergency department last evening when EMS brought family to the house but no contact was made with family, so they brought the patient back to the ED.  Patient's daughter stated that her landlord did not approve the patient going to her apartment.  Plan  Current plan is for [facility] consult for discharge plan.    Audley Hose, MD 12/18/21 [phone]

## 2021-12-18 NOTE — ED Notes (Addendum)
Tried to feed pt. Pt said she was not hungry and did not want to eat at this time.

## 2021-12-18 NOTE — ED Notes (Addendum)
CSW attempted call to pts son and daughter, CSW unable to leave VM for either, will attempt call at a later time. CSW spoke to [place] with [facility] APS, she is looking into open case and will call back with update. CSW reached out to Ginger with the [facility] hoping to gain more information about pts payment to them, CSW left message with the receptionist requesting call back. TOC to follow.   Addendum 3:45pm: CSW has spoken multiple times with multiple agencies over the afternoon regarding pt. Per pts daughter Kenney Houseman she is working to get it approved with her landlord for pt to return to her home with [facility]. Kenney Houseman states if she cannot get approval pt will go to live with Tony's niece. CSW updated APS worker Velna Hatchet of Tonya's concerns regarding pts payee, she will staff case with her supervisor in the morning and update CSW after this has happened.

## 2021-12-18 NOTE — ED Notes (Addendum)
Patient alert, sitting up in bed feeding self at this time. Refusing to use utensils or allow staff to assist with eating. Continues with confusion. Has removed mittens multiple times as well as gown, pulled off Purwick device, removed bedding.

## 2021-12-19 MED ORDER — ENSURE ENLIVE PO LIQD
237.0000 mL | Freq: Two times a day (BID) | ORAL | Status: DC
  Administered 2021-12-20 – 2021-12-25 (×9): 237 mL via ORAL
  Filled 2021-12-19 (×22): qty 237

## 2021-12-19 NOTE — ED Notes (Signed)
CSW updated by Colletta Maryland with DSS that pts son [name] is pts legal guardian. CSW requested that paperwork from court be faxed over. Colletta Maryland states they have requested it from the clerks office and will send when able.

## 2021-12-19 NOTE — ED Notes (Signed)
Pt turned and pillow placed under left side

## 2021-12-20 NOTE — ED Notes (Signed)
Pt drank entire ensure and ate a whole cup of apple sauce. Pt refused lunch tray. Nurse notified.

## 2021-12-20 NOTE — ED Notes (Signed)
Pt drank an ensure and grape juice.

## 2021-12-20 NOTE — ED Notes (Signed)
Pt drank entire ensure, but refused food. Nurse notified.

## 2021-12-20 NOTE — Progress Notes (Signed)
CM spoke with DSS supervisor Patriciaann Clan regarding patient.  Erin Jordan shares that DSS has made a request to receive a copy of the patient's guardianship paperwork from the court, however have not received this yet.  DSS believes patient's son is the legal guardian.  CM requested again that a copy of paperwork be provided to hospital as we will need this to determine the primary decision maker for this patient. TOC is awaiting guardian confirmation to proceed with discharge planning.

## 2021-12-21 MED ORDER — TRAZODONE HCL 50 MG PO TABS
50.0000 mg | ORAL_TABLET | Freq: Every day | ORAL | Status: DC
  Administered 2021-12-21 – 2021-12-23 (×3): 50 mg via ORAL
  Filled 2021-12-21 (×5): qty 1

## 2021-12-21 MED ORDER — ACETAMINOPHEN 325 MG PO TABS: 650.0000 mg | ORAL_TABLET | Freq: Four times a day (QID) | ORAL | Status: DC | PRN

## 2021-12-21 MED ORDER — LEVOTHYROXINE SODIUM 50 MCG PO TABS
150.0000 ug | ORAL_TABLET | Freq: Every day | ORAL | Status: DC
  Administered 2021-12-21 – 2021-12-24 (×4): 150 ug via ORAL
  Filled 2021-12-21 (×5): qty 3

## 2021-12-21 MED ORDER — ACETAMINOPHEN 325 MG PO TABS: 650.0000 mg | ORAL_TABLET | Freq: Two times a day (BID) | ORAL | Status: DC

## 2021-12-21 MED ORDER — MORPHINE SULFATE (CONCENTRATE) 10 MG /0.5 ML PO SOLN: 5.0000 mg | ORAL | Status: DC | PRN

## 2021-12-21 NOTE — Progress Notes (Signed)
TOC is awaiting guardianship documents, request has been sent to clerk of court.

## 2021-12-21 NOTE — ED Notes (Signed)
  Patient adjusted in bed and given nightly medications.  Patient given several warm blankets and reassessed vitals.  Patient was appreciative and resting comfortably at this time.

## 2021-12-22 LAB — CBG MONITORING, ED: Glucose-Capillary: 84 mg/dL (ref 70–99)

## 2021-12-22 NOTE — ED Notes (Signed)
Daughters at the bedside and has given the patient a bed bath with bed linen, gown and brief changed. Daughter attempting to feed patient at this time and patient able to eat pears thus far.

## 2021-12-22 NOTE — ED Notes (Signed)
Son and legal guardian at the bedside who states he would like his mom on comfort care only. Son brought in legal guardianship paperwork. Dr. Pearline Cables made aware and is at bedside speaking with son.

## 2021-12-22 NOTE — ED Notes (Signed)
Spoke with [name], patient's son and legal guardian at this time and had a lengthy conversation on his mom's status. Patient is refusing to eat solid foods and has been for 2-3 days and has only been drinking ensure. Patient is alert to self only and that a visit from family may help the patient become more alert and engage in conversation. Patient sleeps throughout the day and voices no complaints, however this is no comfort care orders. Awaiting decision to be made about patient's care from son and family. SW contacted as well. Dr. Pearline Cables made aware of conversation and verbalizes understanding.

## 2021-12-22 NOTE — ED Notes (Signed)
With much encouragement, Pt able to drink an ensure. Pt extremely confused and needs constant redirection.

## 2021-12-22 NOTE — Progress Notes (Addendum)
TOC CSW spoke with Erin Jordan [phone] at [facility] at [place].  Erin Jordan will be contacting the family to assist with coordinating her stay at [facility] Tarpley-Carter, MSW, LCSW-A Pronouns:  She/Her/Hers [facility] HealthTransitions of Care Clinical Social Worker Direct Number:  [phone] [email or web address]

## 2021-12-22 NOTE — ED Notes (Signed)
Patient awake to voice.

## 2021-12-22 NOTE — ED Notes (Signed)
Patient sat up and adjusted in bed with NT Tori at bedside to feed patient.

## 2021-12-22 NOTE — Progress Notes (Signed)
TOC CSW was contacted by nurse in regards to concerns for care orders.  CSW stated to nurse [facility] has been working diligently to obtain documentation from either family or the court.  Nurse will continue to wait for a decision to be made about pts care by pts son, [name] and the family.  TOC Supervisor contacted the clerk of court on Friday (12/21/2021) requesting guardianship documents.  As of today, nothing has been received.  Kennis Buell Tarpley-Carter, MSW, LCSW-A Pronouns:  She/Her/Hers [facility] HealthTransitions of Care Clinical Social Worker Direct Number:  [phone] [email or web address]

## 2021-12-22 NOTE — ED Notes (Signed)
Pt refuses to eat food. Was able to get pt to drink half a vanilla ensure without spitting it out. Nurse and MD notified.

## 2021-12-22 NOTE — ED Notes (Signed)
Pt soiled bed and was lying naked when checking at shift change. This tech and RN cleaned pt and did a whole bed change. Pt's purewick changed, brief, incontinence pad, bed sheet, etc. Nurse aware

## 2021-12-22 NOTE — ED Notes (Signed)
With maximum assist from RN, Pt able to eat a cup of applesauce, cup of pears, and drink 1/2 of Ensure Btl.

## 2021-12-22 NOTE — ED Provider Notes (Signed)
Emergency Medicine Observation Re-evaluation Note  Erin Jordan is a 76 y.o. female, seen on rounds today.  Pt initially presented to the ED for complaints of Dementia Currently, the patient is resting.  Physical Exam  BP (!) 140/109 (BP Location: Right Wrist)   Pulse 80   Temp 97.8 F (36.6 C) (Oral)   Resp 18   Ht '5\' 6"'$  (1.676 m)   Wt 68 kg   SpO2 100%   BMI 24.21 kg/m  Physical Exam General: nad Psych: cooperative   ED Course / MDM  EKG:   I have reviewed the labs performed to date as well as medications administered while in observation.  Recent changes in the last 24 hours include no acute changes.  Patient has been here for approximately 5 days, difficulty reaching family.  Initial plan was to send patient home with hospice [facility] but appears the family is refusing to take the patient home.  She has guardian but family are refusing to provide paperwork.  Request sent to [place] per SW.  APS was contacted and reported opened. Will d/w s/w today to see if we can get a hold of family or potential next steps.   Plan  Current plan is for hospice.    Jeanell Sparrow, DO 12/22/21 [phone]

## 2021-12-23 NOTE — ED Notes (Signed)
Pt able to take medication with help of RN, medication given crushed in applesauce.

## 2021-12-23 NOTE — ED Notes (Signed)
Attempted to give Ensure PO, pt tightened lips and would not take any when offered. Pt opened her eyes and looked at me, but would not accept anything PO through straw.

## 2021-12-23 NOTE — ED Notes (Signed)
HS med administered crushed with apple sauce. With encouragement, one 4 oz. cup of applesauce given and approx 75% of a 4oz fruit cup given

## 2021-12-23 NOTE — ED Provider Notes (Signed)
Emergency Medicine Observation Re-evaluation Note  Erin Jordan is a 76 y.o. female, seen on rounds today.  Pt initially presented to the ED for complaints of Dementia Currently, the patient is calm.  Physical Exam  BP (!) 136/97   Pulse 64   Temp 97.7 F (36.5 C) (Axillary)   Resp 17   Ht '5\' 6"'$  (1.676 m)   Wt 68 kg   SpO2 100%   BMI 24.21 kg/m  Physical Exam General: nad  Psych: calm, sleeping  ED Course / MDM  EKG:   I have reviewed the labs performed to date as well as medications administered while in observation.  Recent changes in the last 24 hours include planning for residential hospice when spot becomes avail, SW diligently working on this, appreciate help, family aware of situation.  Plan  Current plan is for placement/hospice.    Wynona Dove A, DO 12/23/21 1433

## 2021-12-23 NOTE — ED Notes (Signed)
Woke pt and attempted to give Ensure- asked pt if she wanted it and she shook her head side to side. Pressed straw to her lips and she tightened her lips to prevent insertion. Did not force straw into mouth

## 2021-12-24 NOTE — ED Notes (Addendum)
Attempted to give pt trazodone however pt spit out pill twice. Encouraged pt to drink some ensure but pt refused. Pt repositioned and pillow placed under right side

## 2021-12-24 NOTE — ED Notes (Signed)
Pt drank 1 cup water, 2 cups of apple juice and ate 1 cup of apple sauce.

## 2021-12-24 NOTE — ED Notes (Signed)
Turned pt onto R side,

## 2021-12-24 NOTE — ED Notes (Signed)
Per charge rn they attempted to feed pt an ensure and she spit it out and then refused to eat.

## 2021-12-24 NOTE — ED Provider Notes (Signed)
Emergency Medicine Observation Re-evaluation Note  Erin Jordan is a 76 y.o. female, seen on rounds today.  Pt initially presented to the ED for complaints of Dementia Currently, the patient is calm.  Physical Exam  BP (!) 158/97   Pulse 82   Temp 97.8 F (36.6 C) (Axillary)   Resp 17   Ht '5\' 6"'$  (1.676 m)   Wt 68 kg   SpO2 100%   BMI 24.21 kg/m  Physical Exam General:, No acute distress Cardiac: Regular rate Lungs: Breathing comfortably Psych: Calm  ED Course / MDM  EKG:   I have reviewed the labs performed to date as well as medications administered while in observation.  Recent changes in the last 24 hours include no acute events. planning for residential hospice when spot becomes avail, SW diligently working on this, appreciate help, family aware of situation.  Plan  Current plan is for placement/hospice.    Elgie Congo, MD 12/24/21 [phone]

## 2021-12-24 NOTE — Progress Notes (Signed)
[facility] hosp[facility] nurse to come to hospital tomorrow at 1200 to assess patient in person for residential hospice eligibility.  Patient's son and LG Erin Jordan is anticipated to be at bedside for this assessment.

## 2021-12-24 NOTE — ED Notes (Signed)
Pt was able to drink two ensures and a cup of apple juice. Nurse notified.

## 2021-12-24 NOTE — Progress Notes (Addendum)
CM followed up with [facility], who reports they were under the impression that patient would be discharging home with family, CM clarified that this is not the case.  CM has spoken with son and Legal Guardian, Erin Jordan, who states it is his and his siblings wish for his mother to go to residential hospice for end of life care.  Erin Jordan reports he is waiting for Erin Jordan to confirm if they are able to accept.  CM has requested that [facility] re-evaluate patient for residential hospice at the facility and faxed clinical documents for review.   TOC has received a copy of the Legal guardianship paperwork from the court and confirmed that Erin Jordan is the legal guardian and therefore primary decision maker for this patient.

## 2021-12-24 NOTE — ED Notes (Signed)
Pt turned onto her L side, changed gown, repositioned pt

## 2021-12-24 NOTE — Progress Notes (Signed)
CM spoke with [facility] rep Marchia Meiers, who reports their medical director is reviewing the case and she will update this CM later this afternoon on a decision.  If accepted a bed may become available this evening.

## 2021-12-24 NOTE — ED Notes (Signed)
Pt in bed with eyes closed, resps even and unlabored, pt opens eyes to verbal stim, pt has unintelligible speech, pt nods yes or no, asked pt if she needed anything, pt nods no.

## 2021-12-25 NOTE — ED Notes (Signed)
Pt ate 75% and drank 458m for breakfast.

## 2021-12-25 NOTE — ED Provider Notes (Signed)
Emergency Medicine Observation Re-evaluation Note  ELZENA MUSTON is a 76 y.o. female, seen on rounds today.  Pt initially presented to the ED for complaints of Dementia Currently, the patient is sleeping.  Physical Exam  BP (!) 156/93 (BP Location: Left Arm)   Pulse 72   Temp 97.8 F (36.6 C) (Axillary)   Resp 16   Ht '5\' 6"'$  (1.676 m)   Wt 68 kg   SpO2 100%   BMI 24.21 kg/m  Physical Exam General:, No acute distress Cardiac: Regular rate Lungs: Breathing comfortably Psych: Calm  ED Course / MDM  EKG:   I have reviewed the labs performed to date as well as medications administered while in observation.  Recent changes in the last 24 hours include no acute events. planning for residential hospice when spot becomes avail, SW diligently working on this, appreciate help, family aware of situation. Patient has begun refusing food/drink.   Plan  Current plan is for placement/hospice.      Audley Hose, MD 12/25/21 [phone]

## 2021-12-25 NOTE — Progress Notes (Signed)
AP ED07 [facility] Liaison Note  Received request from [place], [facility] for family interest in [facility]. Spoke with patient's daughter-in-law. Upon assessment, patient is not eligible per [facility] MD at this time.   Please reach out with any questions,  Thank you,  Zigmund Gottron RN  [facility] Liaison [phone]

## 2021-12-25 NOTE — ED Notes (Signed)
Social worker at bedside with son discussing care.

## 2021-12-25 NOTE — ED Notes (Addendum)
CSW updated that pt does not qualify for admission to [facility]. CSW spoke with pts son to provide update, he is understanding. Pts son agreeable to [facility] referral to see if pt qualifies for their residential hospice, CSW updated by Judson Roch with [facility] that pt does not meet criteria. CSW updated pts son.   Pts son Erin Jordan wishes for pt to return to his home at [place], [place] [address] with [facility] with hospice. CSW spoke to [facility] with Hospice who will reach out to Erin Jordan to gather information on starting care and getting needed DME. CSW to follow up with [facility] on timeline for getting DME and D/C home for pt. TOC to follow.

## 2021-12-25 NOTE — ED Notes (Signed)
Pt drank of all her ensure

## 2021-12-25 NOTE — ED Notes (Signed)
Pt stated she does not want to eat her breakfast tray at the moment. Tray is at bedside.

## 2021-12-25 NOTE — ED Notes (Signed)
Pt was feed her tray. Pt ate 1/4 of her food and drank 480 cc of tea.

## 2021-12-25 NOTE — ED Notes (Signed)
Hospice here to eval.

## 2021-12-25 NOTE — ED Notes (Addendum)
Attempted to feed pt twice but the social workers  was in there each time talking with the family. I made family and ss worker that I would be back to feed the pt.

## 2021-12-25 NOTE — ED Notes (Addendum)
Feed Pt , the most the pt would eat was a tsp of gravy, two small square pieces of peaches and a little of peach juice. Son was at bedside. Also repositioned Pt.

## 2021-12-26 MED ORDER — ENSURE ENLIVE PO LIQD
237.0000 mL | Freq: Three times a day (TID) | ORAL | Status: DC
  Administered 2021-12-26 (×2): 237 mL via ORAL
  Filled 2021-12-26 (×11): qty 237

## 2021-12-26 NOTE — ED Notes (Signed)
Pt ate approximately half of a pudding cup.

## 2021-12-26 NOTE — ED Notes (Signed)
Pt given ensure and took a few sips.  Pt found with linen wet and with visible bowel movement. Pt cleaned, linen and brief changed.

## 2021-12-26 NOTE — ED Notes (Addendum)
CSW spoke to [facility] with [facility] who spoke with pts son who was unsure why his mother was denied for residential. Marchia Meiers had hospice MD review pts chart again and it was stated that she does not meet criteria for residential services at this time. Marchia Meiers spoke with pts son this morning to confirm needed DME. DME has been ordered and Marchia Meiers will confirm delivery with pts son and [place]. TOC to follow.   Addendum 1pm: CSW updated by Marchia Meiers with hospice that [name] has placed a hold on DME delivery as they are thinking pt will now go to her daughters home in [place]. Per Marchia Meiers she was unable to get an address for daughter and they may not be able to provide services depending on location. CSW updated caseworker Brandi with DSS who states she will speak with her supervisors to figure out plan moving forward as there needs to be a final D/C plan in place for pt. TOC to follow.

## 2021-12-26 NOTE — ED Notes (Addendum)
Pt drank one ensure. Pt had a few bites of jello.

## 2021-12-26 NOTE — ED Notes (Signed)
Pt had a medium amount BM, brief changed, linens changed, and gown changed. Peri care performed.

## 2021-12-26 NOTE — ED Notes (Signed)
AC called for ensure supplements

## 2021-12-26 NOTE — ED Notes (Signed)
MD changed diet order to a full liquid diet as pt is not able to tolerate solid, chewable foods at this time. Pt refused to eat entire full liquid breakfast meal, but she did take a couple bites of jello.

## 2021-12-26 NOTE — ED Notes (Signed)
Pt drank 240 ml of her ensure at this time.

## 2021-12-26 NOTE — ED Notes (Signed)
Pt ate 1/2 of food tray, drank 240 cc of tea, and drank 180 cc of apple juice.

## 2021-12-26 NOTE — ED Notes (Signed)
Pt drank ensure and took several bites of jello

## 2021-12-27 NOTE — ED Notes (Signed)
Gave pt her dinner tray. Pt stated that she was not ready to eat at this time nor to drink her ensure. Will try again at a later time. Pt was very verbal about what she stated to both the nurse and I.

## 2021-12-27 NOTE — ED Notes (Signed)
Pt was given breakfast. Attempted to wake pt twice but pt was not wanting to wake at this time.

## 2021-12-27 NOTE — ED Notes (Signed)
This RN attempted to give trazodone and ensure. Pt spit out both and refused to take any more.

## 2021-12-27 NOTE — ED Notes (Signed)
I was able to wake pt enough to get pt to eat her lunch tray. Pt only ate a half a tsp of orange jello , half a tsp of vanilla pudding, did not like the cream of chicken at all pt spit the cream of chicken out. Pt did drink all of her ensure. Also cleaned and washed pts face.

## 2021-12-27 NOTE — ED Notes (Signed)
Pt has generalized edema +4 on all extremities and body. EDP made aware

## 2021-12-27 NOTE — ED Notes (Addendum)
CSW spoke to [place] with [facility] who states that she has spoken with [name] who states plan is for pt to D/C to his home. Once DME has been delivered pt can be discharged home with hospice care. CSW awaiting for update of when DME has been delivered. CSW also spoke to caseworker Brandi who states they spoke with [name] today also and confirm plan for pt to return to the home of [name] once DME has been delivered. TOC to follow.

## 2021-12-27 NOTE — ED Provider Notes (Signed)
Emergency Medicine Observation Re-evaluation Note  Erin Jordan is a 76 y.o. female, seen on rounds today.  Pt initially presented to the ED for complaints of hx CKD and dementia, recent d/c from hospital w family, but family returned patient that same day to ED where pt has remained since.  It appear family/TOC has been trying to arrange housing plan, and at this point looks like return to home of son or daughter needs to be the plan for pt. No new physical c/o this AM.   Physical Exam  BP 132/89   Pulse 88   Temp 97.9 F (36.6 C)   Resp 16   Ht 1.676 m ('5\' 6"'$ )   Wt 68 kg   SpO2 97%   BMI 24.21 kg/m  Physical Exam General: content, nad.  Cardiac: regular rate Lungs: breathing comfortably Psych: calm, comfortable appearing.   ED Course / MDM  EKG:   I have reviewed the labs performed to date as well as medications administered while in observation.  Recent changes in the last 24 hours include ED obs. .  Plan  Current plan is for [facility] facilitation of d/c to home/family home.      Lajean Saver, MD 12/27/21 [phone]

## 2021-12-27 NOTE — ED Notes (Signed)
Pt sleeping. She is arousable but there is low confidence in her ability to safely take PO.

## 2021-12-27 NOTE — ED Notes (Signed)
Pt repositioned in bed and given blanket and pillows. Pt appears to be comfortable with no sings of any distress at this time.

## 2021-12-28 NOTE — ED Notes (Signed)
Pt offered ENSURE and ice water.  Pt verbally refused both.  Straw was placed on lips, incase Pt did not understand offer, Pt turned head and refused.

## 2021-12-28 NOTE — Progress Notes (Signed)
CM received notification from [facility] that all equipment has been delivered to the home.  Patient's son and LG will be in the home after 3 pm today to receive patient.

## 2021-12-28 NOTE — Discharge Instructions (Signed)
Follow up as needed with your md °

## 2021-12-28 NOTE — ED Notes (Signed)
Per CSW, Hospice is set-up and family is ready for the Pt.  EMS called son and confirmed her will be at the house.  All belongings packed up.

## 2021-12-28 NOTE — ED Notes (Signed)
ED Discharge papers and Discharge papers from previous admission given to EMS.

## 2021-12-28 NOTE — ED Provider Notes (Signed)
Emergency Medicine Observation Re-evaluation Note  Erin Jordan is a 76 y.o. female, seen on rounds today.  Pt initially presented to the ED for complaints of Dementia Currently, the patient is resting comfortably.  Physical Exam  BP (!) 196/90 (BP Location: Left Wrist)   Pulse 85   Temp 97.6 F (36.4 C) (Axillary)   Resp 18   Ht '5\' 6"'$  (1.676 m)   Wt 68 kg   SpO2 100%   BMI 24.21 kg/m  Physical Exam General: No acute distress Cardiac: Normal rate Lungs: No respiratory distress Psych: Asleep  ED Course / MDM  EKG:   I have reviewed the labs performed to date as well as medications administered while in observation.  Recent changes in the last 24 hours include refusing many medications due to dementia.  Plan  Current plan is for discharge to home of son on hospice care once DME has been arranged.    Cristie Hem, MD 12/28/21 [phone]

## 2021-12-28 NOTE — ED Notes (Addendum)
Pt refusing food and drinks.

## 2021-12-28 NOTE — ED Notes (Signed)
Pt agreeable to take 2 big sips of ice water.  Denies needs.

## 2021-12-28 NOTE — ED Notes (Signed)
Attempted to give levothyroxine to pt and water. Pt refused medication and water

## 2021-12-28 NOTE — ED Notes (Signed)
Pt noted to be resting comfortably.  [place] noted to be in place.  Pt refused breakfast and denied any needs.

## 2021-12-28 NOTE — ED Notes (Signed)
Per CSW, NS contacted EMS for transport.

## 2021-12-28 NOTE — ED Provider Notes (Deleted)
.     [name], MD 12/28/21 1540

## 2022-08-07 DEATH — deceased
